# Patient Record
Sex: Female | Born: 1935
Health system: Southern US, Community
[De-identification: ages and names within clinical notes are randomized; demographics above are authoritative.]

## PROBLEM LIST (undated history)

## (undated) DIAGNOSIS — R609 Edema, unspecified: Secondary | ICD-10-CM

## (undated) DIAGNOSIS — I4891 Unspecified atrial fibrillation: Secondary | ICD-10-CM

## (undated) DIAGNOSIS — N83209 Unspecified ovarian cyst, unspecified side: Secondary | ICD-10-CM

## (undated) DIAGNOSIS — C801 Malignant (primary) neoplasm, unspecified: Secondary | ICD-10-CM

## (undated) DIAGNOSIS — I10 Essential (primary) hypertension: Secondary | ICD-10-CM

## (undated) DIAGNOSIS — F419 Anxiety disorder, unspecified: Secondary | ICD-10-CM

## (undated) DIAGNOSIS — E785 Hyperlipidemia, unspecified: Secondary | ICD-10-CM

## (undated) DIAGNOSIS — M549 Dorsalgia, unspecified: Secondary | ICD-10-CM

## (undated) DIAGNOSIS — M810 Age-related osteoporosis without current pathological fracture: Secondary | ICD-10-CM

## (undated) DIAGNOSIS — G8929 Other chronic pain: Secondary | ICD-10-CM

## (undated) DIAGNOSIS — R41841 Cognitive communication deficit: Secondary | ICD-10-CM

## (undated) DIAGNOSIS — M8580 Other specified disorders of bone density and structure, unspecified site: Secondary | ICD-10-CM

## (undated) DIAGNOSIS — C50919 Malignant neoplasm of unspecified site of unspecified female breast: Secondary | ICD-10-CM

## (undated) DIAGNOSIS — R109 Unspecified abdominal pain: Secondary | ICD-10-CM

## (undated) DIAGNOSIS — R1031 Right lower quadrant pain: Secondary | ICD-10-CM

## (undated) DIAGNOSIS — K219 Gastro-esophageal reflux disease without esophagitis: Secondary | ICD-10-CM

## (undated) HISTORY — PX: APPENDECTOMY: SHX54

## (undated) HISTORY — DX: Edema, unspecified: R60.9

## (undated) HISTORY — DX: Other specified disorders of bone density and structure, unspecified site: M85.80

## (undated) HISTORY — DX: Age-related osteoporosis without current pathological fracture: M81.0

## (undated) HISTORY — DX: Hyperlipidemia, unspecified: E78.5

## (undated) HISTORY — PX: BREAST SURGERY: SHX581

## (undated) HISTORY — PX: COLON RESECTION: SHX5231

## (undated) HISTORY — DX: Gastro-esophageal reflux disease without esophagitis: K21.9

## (undated) HISTORY — PX: COLON SURGERY: SHX602

## (undated) HISTORY — PX: HERNIA REPAIR: SHX51

---

## 2000-07-25 ENCOUNTER — Encounter: Admission: RE | Admit: 2000-07-25 | Discharge: 2000-07-25 | Payer: Self-pay | Admitting: General Surgery

## 2001-02-27 ENCOUNTER — Observation Stay (HOSPITAL_COMMUNITY): Admission: RE | Admit: 2001-02-27 | Discharge: 2001-02-28 | Payer: Self-pay | Admitting: General Surgery

## 2001-12-09 ENCOUNTER — Encounter: Payer: Self-pay | Admitting: General Surgery

## 2001-12-09 ENCOUNTER — Encounter: Payer: Self-pay | Admitting: Emergency Medicine

## 2001-12-10 ENCOUNTER — Inpatient Hospital Stay (HOSPITAL_COMMUNITY): Admission: EM | Admit: 2001-12-10 | Discharge: 2001-12-17 | Payer: Self-pay | Admitting: Emergency Medicine

## 2002-02-04 ENCOUNTER — Encounter: Payer: Self-pay | Admitting: Family Medicine

## 2002-02-04 ENCOUNTER — Ambulatory Visit (HOSPITAL_COMMUNITY): Admission: RE | Admit: 2002-02-04 | Discharge: 2002-02-04 | Payer: Self-pay | Admitting: Family Medicine

## 2002-09-27 ENCOUNTER — Emergency Department (HOSPITAL_COMMUNITY): Admission: EM | Admit: 2002-09-27 | Discharge: 2002-09-27 | Payer: Self-pay | Admitting: *Deleted

## 2002-09-27 ENCOUNTER — Encounter: Payer: Self-pay | Admitting: *Deleted

## 2002-10-04 ENCOUNTER — Encounter (HOSPITAL_COMMUNITY): Admission: RE | Admit: 2002-10-04 | Discharge: 2002-11-03 | Payer: Self-pay | Admitting: Family Medicine

## 2002-10-07 ENCOUNTER — Encounter: Payer: Self-pay | Admitting: Family Medicine

## 2003-02-23 ENCOUNTER — Emergency Department (HOSPITAL_COMMUNITY): Admission: EM | Admit: 2003-02-23 | Discharge: 2003-02-23 | Payer: Self-pay | Admitting: Emergency Medicine

## 2003-07-04 ENCOUNTER — Inpatient Hospital Stay (HOSPITAL_COMMUNITY): Admission: RE | Admit: 2003-07-04 | Discharge: 2003-07-09 | Payer: Self-pay | Admitting: General Surgery

## 2003-12-08 ENCOUNTER — Ambulatory Visit (HOSPITAL_COMMUNITY): Admission: RE | Admit: 2003-12-08 | Discharge: 2003-12-08 | Payer: Self-pay | Admitting: Family Medicine

## 2004-09-23 ENCOUNTER — Encounter: Admission: RE | Admit: 2004-09-23 | Discharge: 2004-09-23 | Payer: Self-pay | Admitting: Family Medicine

## 2005-10-20 ENCOUNTER — Ambulatory Visit (HOSPITAL_COMMUNITY): Admission: RE | Admit: 2005-10-20 | Discharge: 2005-10-20 | Payer: Self-pay | Admitting: Internal Medicine

## 2005-10-20 ENCOUNTER — Ambulatory Visit: Payer: Self-pay | Admitting: Internal Medicine

## 2005-10-26 ENCOUNTER — Encounter: Admission: RE | Admit: 2005-10-26 | Discharge: 2005-10-26 | Payer: Self-pay | Admitting: Family Medicine

## 2005-10-29 ENCOUNTER — Emergency Department (HOSPITAL_COMMUNITY): Admission: EM | Admit: 2005-10-29 | Discharge: 2005-10-29 | Payer: Self-pay | Admitting: Emergency Medicine

## 2006-04-30 ENCOUNTER — Inpatient Hospital Stay (HOSPITAL_COMMUNITY): Admission: EM | Admit: 2006-04-30 | Discharge: 2006-05-02 | Payer: Self-pay | Admitting: Emergency Medicine

## 2006-07-21 ENCOUNTER — Ambulatory Visit (HOSPITAL_COMMUNITY): Admission: RE | Admit: 2006-07-21 | Discharge: 2006-07-21 | Payer: Self-pay | Admitting: Family Medicine

## 2006-09-20 ENCOUNTER — Ambulatory Visit: Payer: Self-pay | Admitting: Cardiology

## 2006-10-18 ENCOUNTER — Ambulatory Visit: Payer: Self-pay | Admitting: Cardiology

## 2006-12-11 ENCOUNTER — Encounter: Admission: RE | Admit: 2006-12-11 | Discharge: 2006-12-11 | Payer: Self-pay | Admitting: Family Medicine

## 2007-01-08 ENCOUNTER — Ambulatory Visit: Payer: Self-pay | Admitting: Cardiology

## 2007-05-05 ENCOUNTER — Emergency Department (HOSPITAL_COMMUNITY): Admission: EM | Admit: 2007-05-05 | Discharge: 2007-05-05 | Payer: Self-pay | Admitting: Emergency Medicine

## 2007-08-14 ENCOUNTER — Ambulatory Visit (HOSPITAL_COMMUNITY): Admission: RE | Admit: 2007-08-14 | Discharge: 2007-08-14 | Payer: Self-pay | Admitting: Family Medicine

## 2008-08-13 ENCOUNTER — Ambulatory Visit (HOSPITAL_COMMUNITY): Admission: RE | Admit: 2008-08-13 | Discharge: 2008-08-13 | Payer: Self-pay | Admitting: Family Medicine

## 2008-08-19 ENCOUNTER — Encounter: Payer: Self-pay | Admitting: Family Medicine

## 2008-08-19 ENCOUNTER — Ambulatory Visit: Payer: Self-pay | Admitting: Cardiology

## 2008-08-19 ENCOUNTER — Ambulatory Visit (HOSPITAL_COMMUNITY): Admission: RE | Admit: 2008-08-19 | Discharge: 2008-08-19 | Payer: Self-pay | Admitting: Family Medicine

## 2008-09-06 ENCOUNTER — Observation Stay (HOSPITAL_COMMUNITY): Admission: EM | Admit: 2008-09-06 | Discharge: 2008-09-07 | Payer: Self-pay | Admitting: Emergency Medicine

## 2008-09-08 ENCOUNTER — Ambulatory Visit (HOSPITAL_COMMUNITY): Admission: RE | Admit: 2008-09-08 | Discharge: 2008-09-08 | Payer: Self-pay | Admitting: Family Medicine

## 2008-09-09 ENCOUNTER — Emergency Department (HOSPITAL_COMMUNITY): Admission: EM | Admit: 2008-09-09 | Discharge: 2008-09-09 | Payer: Self-pay | Admitting: Emergency Medicine

## 2008-09-12 ENCOUNTER — Ambulatory Visit: Payer: Self-pay | Admitting: Cardiology

## 2008-09-12 ENCOUNTER — Encounter: Payer: Self-pay | Admitting: Family Medicine

## 2008-09-12 ENCOUNTER — Inpatient Hospital Stay (HOSPITAL_COMMUNITY): Admission: EM | Admit: 2008-09-12 | Discharge: 2008-09-14 | Payer: Self-pay | Admitting: Emergency Medicine

## 2008-11-17 ENCOUNTER — Emergency Department (HOSPITAL_COMMUNITY): Admission: EM | Admit: 2008-11-17 | Discharge: 2008-11-17 | Payer: Self-pay | Admitting: Emergency Medicine

## 2009-03-21 ENCOUNTER — Emergency Department (HOSPITAL_COMMUNITY): Admission: EM | Admit: 2009-03-21 | Discharge: 2009-03-21 | Payer: Self-pay | Admitting: Emergency Medicine

## 2009-04-01 ENCOUNTER — Ambulatory Visit (HOSPITAL_COMMUNITY): Admission: RE | Admit: 2009-04-01 | Discharge: 2009-04-01 | Payer: Self-pay | Admitting: Family Medicine

## 2009-05-21 ENCOUNTER — Encounter: Admission: RE | Admit: 2009-05-21 | Discharge: 2009-05-21 | Payer: Self-pay | Admitting: Surgery

## 2009-05-25 ENCOUNTER — Ambulatory Visit (HOSPITAL_BASED_OUTPATIENT_CLINIC_OR_DEPARTMENT_OTHER): Admission: RE | Admit: 2009-05-25 | Discharge: 2009-05-25 | Payer: Self-pay | Admitting: Surgery

## 2009-05-25 ENCOUNTER — Encounter: Admission: RE | Admit: 2009-05-25 | Discharge: 2009-05-25 | Payer: Self-pay | Admitting: Surgery

## 2009-07-20 ENCOUNTER — Ambulatory Visit (HOSPITAL_COMMUNITY): Payer: Self-pay | Admitting: Oncology

## 2009-11-30 ENCOUNTER — Emergency Department (HOSPITAL_COMMUNITY): Admission: EM | Admit: 2009-11-30 | Discharge: 2009-11-30 | Payer: Self-pay | Admitting: Emergency Medicine

## 2009-12-18 ENCOUNTER — Encounter (HOSPITAL_COMMUNITY): Admission: RE | Admit: 2009-12-18 | Discharge: 2010-01-17 | Payer: Self-pay | Admitting: Cardiovascular Disease

## 2009-12-22 ENCOUNTER — Encounter (INDEPENDENT_AMBULATORY_CARE_PROVIDER_SITE_OTHER): Payer: Self-pay | Admitting: Cardiovascular Disease

## 2010-07-05 ENCOUNTER — Inpatient Hospital Stay (INDEPENDENT_AMBULATORY_CARE_PROVIDER_SITE_OTHER)
Admission: RE | Admit: 2010-07-05 | Discharge: 2010-07-05 | Disposition: A | Discharge status: Home / Self Care | Payer: Medicare Other | Source: Ambulatory Visit | Attending: Family Medicine | Admitting: Family Medicine

## 2010-07-05 DIAGNOSIS — H6690 Otitis media, unspecified, unspecified ear: Secondary | ICD-10-CM

## 2010-07-19 ENCOUNTER — Ambulatory Visit (HOSPITAL_COMMUNITY): Payer: Self-pay | Admitting: Oncology

## 2010-08-02 ENCOUNTER — Inpatient Hospital Stay (HOSPITAL_COMMUNITY)
Admission: EM | Admit: 2010-08-02 | Discharge: 2010-08-05 | DRG: 310 | Disposition: A | Discharge status: Home Health | Payer: Medicare Other | Attending: Internal Medicine | Admitting: Internal Medicine

## 2010-08-02 ENCOUNTER — Emergency Department (HOSPITAL_COMMUNITY): Payer: Medicare Other

## 2010-08-02 ENCOUNTER — Encounter (HOSPITAL_COMMUNITY): Payer: Self-pay | Admitting: Radiology

## 2010-08-02 DIAGNOSIS — F3289 Other specified depressive episodes: Secondary | ICD-10-CM | POA: Diagnosis present

## 2010-08-02 DIAGNOSIS — I4891 Unspecified atrial fibrillation: Principal | ICD-10-CM | POA: Diagnosis present

## 2010-08-02 DIAGNOSIS — Z853 Personal history of malignant neoplasm of breast: Secondary | ICD-10-CM

## 2010-08-02 DIAGNOSIS — E876 Hypokalemia: Secondary | ICD-10-CM | POA: Diagnosis present

## 2010-08-02 DIAGNOSIS — F329 Major depressive disorder, single episode, unspecified: Secondary | ICD-10-CM | POA: Diagnosis present

## 2010-08-02 DIAGNOSIS — F411 Generalized anxiety disorder: Secondary | ICD-10-CM | POA: Diagnosis present

## 2010-08-02 HISTORY — DX: Essential (primary) hypertension: I10

## 2010-08-02 HISTORY — DX: Malignant (primary) neoplasm, unspecified: C80.1

## 2010-08-02 LAB — BASIC METABOLIC PANEL
BUN: 17 mg/dL (ref 6–23)
Calcium: 9.2 mg/dL (ref 8.4–10.5)
Creatinine, Ser: 0.9 mg/dL (ref 0.4–1.2)
GFR calc non Af Amer: 60 mL/min — ABNORMAL LOW (ref >60)
Glucose, Bld: 122 mg/dL — ABNORMAL HIGH (ref 70–99)

## 2010-08-02 LAB — POCT CARDIAC MARKERS
CKMB, poc: 1.9 ng/mL (ref 1.0–8.0)
Myoglobin, poc: 171 ng/mL (ref 12–200)

## 2010-08-02 LAB — CBC
Hemoglobin: 13.7 g/dL (ref 12.0–15.0)
MCH: 27.9 pg (ref 26.0–34.0)
MCHC: 32.3 g/dL (ref 30.0–36.0)
Platelets: 276 10*3/uL (ref 150–400)
RDW: 13 % (ref 11.5–15.5)

## 2010-08-02 LAB — PROTIME-INR
INR: 1.57 — ABNORMAL HIGH (ref 0.00–1.49)
Prothrombin Time: 19 seconds — ABNORMAL HIGH (ref 11.6–15.2)

## 2010-08-02 LAB — DIFFERENTIAL
Basophils Absolute: 0 10*3/uL (ref 0.0–0.1)
Basophils Relative: 0 % (ref 0–1)
Eosinophils Absolute: 0.7 10*3/uL (ref 0.0–0.7)
Monocytes Absolute: 0.9 10*3/uL (ref 0.1–1.0)
Neutro Abs: 6 10*3/uL (ref 1.7–7.7)

## 2010-08-02 LAB — MRSA PCR SCREENING: MRSA by PCR: NEGATIVE

## 2010-08-03 LAB — CARDIAC PANEL(CRET KIN+CKTOT+MB+TROPI)
Relative Index: INVALID (ref 0.0–2.5)
Troponin I: 0.01 ng/mL (ref 0.00–0.06)

## 2010-08-03 LAB — COMPREHENSIVE METABOLIC PANEL
ALT: 16 U/L (ref 0–35)
Alkaline Phosphatase: 58 U/L (ref 39–117)
BUN: 16 mg/dL (ref 6–23)
CO2: 27 mEq/L (ref 19–32)
Calcium: 9.1 mg/dL (ref 8.4–10.5)
GFR calc non Af Amer: >60 mL/min (ref >60)
Glucose, Bld: 115 mg/dL — ABNORMAL HIGH (ref 70–99)
Potassium: 4.6 mEq/L (ref 3.5–5.1)
Sodium: 142 mEq/L (ref 135–145)
Total Protein: 5.7 g/dL — ABNORMAL LOW (ref 6.0–8.3)

## 2010-08-03 LAB — CBC
HCT: 37.5 % (ref 36.0–46.0)
Hemoglobin: 12.4 g/dL (ref 12.0–15.0)
MCHC: 33.1 g/dL (ref 30.0–36.0)
MCV: 86.6 fL (ref 78.0–100.0)
RDW: 13 % (ref 11.5–15.5)

## 2010-08-03 LAB — DIFFERENTIAL
Basophils Absolute: 0 10*3/uL (ref 0.0–0.1)
Eosinophils Relative: 6 % — ABNORMAL HIGH (ref 0–5)
Lymphocytes Relative: 33 % (ref 12–46)
Lymphs Abs: 2.8 10*3/uL (ref 0.7–4.0)
Monocytes Absolute: 0.7 10*3/uL (ref 0.1–1.0)
Neutro Abs: 4.4 10*3/uL (ref 1.7–7.7)

## 2010-08-03 LAB — PROTIME-INR: Prothrombin Time: 20.1 seconds — ABNORMAL HIGH (ref 11.6–15.2)

## 2010-08-03 NOTE — H&P (Signed)
[name], [name]               ACCOUNT NO.:  [id number]  MEDICAL RECORD NO.:  [id number]           PATIENT TYPE:  I  LOCATION:  IC07                          FACILITY:  [facility]  PHYSICIAN:  Wilson Singer, M.D.DATE OF BIRTH:  1924/04/22  DATE OF ADMISSION:  08/02/2010 DATE OF DISCHARGE:  LH                             HISTORY & PHYSICAL   CHIEF COMPLAINT:  Palpitations.  HISTORY OF PRESENT ILLNESS:  This very pleasant 77 year old lady had sudden onset of palpitations this morning approximately 6-7 hours ago. She denied any dyspnea or chest pain with these palpitations, nor was there any loss of consciousness, lightheadedness, or dizziness.  This patient is known to have paroxysmal atrial fibrillation and is normally on Coumadin for this.  She last had a stress test in July 2011 by Dr. Garen Lah.  This was negative for ischemia.  She has also had an echocardiogram done in August 2011, which showed slightly reduced ejection fraction of 45-50% with no other major abnormalities.  She otherwise functions well without having dyspnea, palpitations, or chest pain on exertion.  PAST MEDICAL HISTORY:  Recent diagnosis of left breast cancer in January 2011 when she had a left lumpectomy and all margins were clear. Laparotomy and colostomy as well as surgery for gangrenous appendix more than 10 years ago.  Paroxysmal atrial fibrillation, depression and anxiety.  MEDICATIONS: 1. Diltiazem 120 mg daily. 2. Fluoxetine 10 mg daily. 3. Levaquin 500 mg daily started on July 29, 2010 for recent symptoms     of cough, likely to be bronchitis. 4. Metoprolol 25 mg daily, . 5. Xanax 0.5 mg half to one tablet daily. 6. Warfarin 5 mg/2.5 mg alternate days.  ALLERGIES:  MORPHINE which produces itching, PENICILLIN and SULFA which I believe both produce a rash.  SOCIAL HISTORY:  She has been widowed for 19 years and lives alone.  She does not smoke and does not drink alcohol.  FAMILY HISTORY:   Noncontributory.  REVIEW OF SYSTEMS:  Apart from the symptoms mentioned above, there are no other symptoms referable to all systems reviewed.  PHYSICAL EXAMINATION:  VITAL SIGNS:  Temperature 98.3, blood pressure 110/58, pulse 90 and is currently in atrial fibrillation.  When she presented, her ventricular rate was 114 in atrial fibrillation, and she has since been started on Cardizem drip, respiratory rate 12-14, saturation 95% on room air. GENERAL:  There is no clubbing.  There is no peripheral central cyanosis.  There is no jaundice. CARDIOVASCULAR:  Heart sounds are present and irregular consistent with atrial fibrillation.  Jugular venous pressure is not raised. RESPIRATORY:  Lung fields are clear except for bilateral basal crackles. ABDOMEN:  Soft and nontender with no hepatosplenomegaly. NEUROLOGIC:  She is alert and oriented without any focal neurologic signs. SKIN:  There are no skin lesions or rashes. MUSCULOSKELETAL:  She has some osteoarthritic changes in her hands.  INVESTIGATIONS:  Chest x-ray shows no acute disease with no evidence of heart failure.  Lab work shows sodium 140, potassium 3.3, bicarbonate 26, glucose 122, BUN 17, creatinine 0.9, INR was subtherapeutic at 1.57. Hemoglobin 13.7, white blood cell count 10.7,  platelets 276.  Initial cardiac markers are negative.  PROBLEM LIST: 1. Atrial fibrillation with rapid ventricular response. 2. Depression and anxiety. 3. History of breast cancer, status post left lumpectomy in January     2011.  PLAN: 1. Admit to telemetry. 2. Increase dose of metoprolol to 25 mg b.i.d. and increase Cardizem     to 180 mg daily. 3. Achieve therapeutic anticoagulation. 4. Cardiology consultation with [facility]. 5. Replete potassium. 6. Further recommendations will depend on the patient's hospital     progress.     Wilson Singer, M.D.     NCG/MEDQ  D:  08/02/2010  T:  08/03/2010  Job:   [id number]  cc:   [name] A. Gerda Diss, MD Fax: [phone]  Nanetta Batty, M.D. Fax: [phone]  Electronically Signed by [name] M.D. on 08/03/2010 04:57:58 PM

## 2010-08-04 LAB — PROTIME-INR
INR: 1.77 — ABNORMAL HIGH (ref 0.00–1.49)
Prothrombin Time: 20.8 seconds — ABNORMAL HIGH (ref 11.6–15.2)

## 2010-08-04 NOTE — Consult Note (Signed)
  [name], Katherine NO.:  [id number]  MEDICAL RECORD NO.:  [id number]           PATIENT TYPE:  LOCATION:                                 FACILITY:  PHYSICIAN:  Nanetta Batty, M.D.   DATE OF BIRTH:  09/23/1923  DATE OF CONSULTATION: DATE OF DISCHARGE:                                CONSULTATION   HISTORY OF PRESENT ILLNESS:  [name] is an 77 year old mildly overweight widowed Caucasian female mother of 1, grandmother 2 grandchildren, and great grandmother of 2 great-grandchildren who is retired from [employer].  I saw her in the office approximately 1 month ago.  She was admitted to [facility] prior to that with AFib with RVR and converted spontaneously to sinus rhythm overnight. She does complain of some dyspnea on exertion.  I elected to put her on Coumadin.  Echo performed at [facility] showed an EF of 45-50% with mild inferior hypokinesia and a Myoview stress test performed there which was low risk, nonischemic.  She was admitted yesterday with AFib with RPR.  Her INR was subtherapeutic.  She was put on IV diltiazem transiently and converted to sinus rhythm.  She is currently asymptomatic.  OUTPATIENT MEDICATIONS:  Diltiazem 120, Coumadin as directed, Prilosec, metoprolol succinate 25 mg a day.  ALLERGIES:  MORPHINE.  REVIEW OF SYSTEMS:  Twelve-point review of systems is negative except as already noted.  PHYSICAL EXAMINATION:  VITAL SIGNS:  Blood pressures of 128/60, pulse 60. GENERAL:  She is alert and oriented. NEUROLOGIC:  Intact. LUNGS:  Clear.  __________ HEART:  Regular rate and rhythm without murmurs, gallops, rubs, or clicks.  DIAGNOSTIC STUDIES:  Twelve lead electrocardiogram on admission revealed AFib with RVR and ventricular response of 150, nonspecific ST-T wave changes.  A subsequent EKG performed today revealed sinus bradycardia 52.  Chest x-ray showed no active disease.  LABORATORY DATA:  Lab  work revealed hemoglobin of 12.4, hematocrit of 37.5, white count of 8.4, platelet count of 226.  Cardiac enzymes were negative.  Sodium was 142, potassium 4.6, chloride 107, CO2 of 27, glucose 115, BUN 16, creatinine 0.7.  IMPRESSION:  [name] has converted to sinus rhythm.  Her warfarin was adjusted.  She was on Lovenox subcu.  At this point, she can be discharged home after Lovenox or stay hospital until she is therapeutic on Coumadin.  We will see her back as needed.     Nanetta Batty, M.D.     JB/MEDQ  D:  08/03/2010  T:  08/04/2010  Job:  [id number]  cc:   [facility] [address] Earl Many A. Gerda Diss, MD Fax: [phone]  Electronically Signed by Nanetta Batty M.D. on 08/04/2010 02:00:46 PM

## 2010-08-05 LAB — DIFFERENTIAL
Basophils Absolute: 0 10*3/uL (ref 0.0–0.1)
Basophils Relative: 0 % (ref 0–1)
Eosinophils Absolute: 0.5 10*3/uL (ref 0.0–0.7)
Neutro Abs: 4.1 10*3/uL (ref 1.7–7.7)
Neutrophils Relative %: 47 % (ref 43–77)

## 2010-08-05 LAB — CBC
Hemoglobin: 13.1 g/dL (ref 12.0–15.0)
Platelets: 247 10*3/uL (ref 150–400)
RBC: 4.64 MIL/uL (ref 3.87–5.11)
WBC: 8.9 10*3/uL (ref 4.0–10.5)

## 2010-08-05 LAB — PROTIME-INR: INR: 1.77 — ABNORMAL HIGH (ref 0.00–1.49)

## 2010-08-08 LAB — POCT CARDIAC MARKERS
Myoglobin, poc: 220 ng/mL (ref 12–200)
Troponin i, poc: <0.05 ng/mL (ref 0.00–0.09)

## 2010-08-08 LAB — BASIC METABOLIC PANEL
CO2: 25 mEq/L (ref 19–32)
Chloride: 109 mEq/L (ref 96–112)
Creatinine, Ser: 0.72 mg/dL (ref 0.4–1.2)
GFR calc Af Amer: >60 mL/min (ref >60)
Potassium: 3.4 mEq/L — ABNORMAL LOW (ref 3.5–5.1)
Sodium: 142 mEq/L (ref 135–145)

## 2010-08-08 LAB — DIFFERENTIAL
Eosinophils Relative: 2 % (ref 0–5)
Lymphocytes Relative: 36 % (ref 12–46)
Lymphs Abs: 3.4 10*3/uL (ref 0.7–4.0)
Monocytes Relative: 11 % (ref 3–12)

## 2010-08-08 LAB — CBC
Hemoglobin: 13.7 g/dL (ref 12.0–15.0)
MCV: 85.4 fL (ref 78.0–100.0)
Platelets: 226 10*3/uL (ref 150–400)
RBC: 4.7 MIL/uL (ref 3.87–5.11)
WBC: 9.5 10*3/uL (ref 4.0–10.5)

## 2010-08-23 LAB — CBC
HCT: 37.8 % (ref 36.0–46.0)
Platelets: 264 10*3/uL (ref 150–400)
RBC: 4.33 MIL/uL (ref 3.87–5.11)
WBC: 8.6 10*3/uL (ref 4.0–10.5)

## 2010-08-23 LAB — COMPREHENSIVE METABOLIC PANEL
ALT: 20 U/L (ref 0–35)
AST: 23 U/L (ref 0–37)
Alkaline Phosphatase: 67 U/L (ref 39–117)
CO2: 29 mEq/L (ref 19–32)
Calcium: 9.1 mg/dL (ref 8.4–10.5)
Chloride: 106 mEq/L (ref 96–112)
GFR calc Af Amer: >60 mL/min (ref >60)
GFR calc non Af Amer: >60 mL/min (ref >60)
Glucose, Bld: 105 mg/dL — ABNORMAL HIGH (ref 70–99)
Sodium: 142 mEq/L (ref 135–145)
Total Bilirubin: 0.7 mg/dL (ref 0.3–1.2)

## 2010-08-23 LAB — URINALYSIS, ROUTINE W REFLEX MICROSCOPIC
Glucose, UA: NEGATIVE mg/dL
Ketones, ur: NEGATIVE mg/dL
Nitrite: NEGATIVE
Protein, ur: NEGATIVE mg/dL
Urobilinogen, UA: 0.2 mg/dL (ref 0.0–1.0)

## 2010-08-23 LAB — DIFFERENTIAL
Basophils Absolute: 0 10*3/uL (ref 0.0–0.1)
Basophils Relative: 0 % (ref 0–1)
Eosinophils Absolute: 0.2 10*3/uL (ref 0.0–0.7)
Eosinophils Relative: 2 % (ref 0–5)
Lymphs Abs: 2.8 10*3/uL (ref 0.7–4.0)
Neutrophils Relative %: 57 % (ref 43–77)

## 2010-08-23 LAB — URINE MICROSCOPIC-ADD ON

## 2010-08-26 LAB — URINE CULTURE: Colony Count: >=100000

## 2010-08-26 LAB — URINALYSIS, ROUTINE W REFLEX MICROSCOPIC
Bilirubin Urine: NEGATIVE
Glucose, UA: NEGATIVE mg/dL
Ketones, ur: NEGATIVE mg/dL
Nitrite: POSITIVE — AB
Protein, ur: 100 mg/dL — AB
Specific Gravity, Urine: 1.015 (ref 1.005–1.030)
Urobilinogen, UA: 0.2 mg/dL (ref 0.0–1.0)
pH: 6 (ref 5.0–8.0)

## 2010-08-26 LAB — URINE MICROSCOPIC-ADD ON

## 2010-08-27 NOTE — Discharge Summary (Signed)
  NAMEHEBAH, [name]               ACCOUNT NO.:  [id number]  MEDICAL RECORD NO.:  [id number]           PATIENT TYPE:  I  LOCATION:  A317                          FACILITY:  [facility]  PHYSICIAN:  Marrisa Kimber I Sheronica Corey, MD      DATE OF BIRTH:  01-26-1924  DATE OF ADMISSION:  08/03/2010 DATE OF DISCHARGE:  03/15/2012LH                              DISCHARGE SUMMARY   PRIMARY CARE PHYSICIAN:  Scott A. Gerda Diss, MD  CARDIOLOGIST:  Nanetta Batty, MD  DISCHARGE DIAGNOSES: 1. Atrial fibrillation with rapid ventricular response, but now normal     sinus rhythm. 2. History of paroxysmal atrial fibrillation. 3. History of left breast cancer status post left lumpectomy. 4. History of depression and anxiety.  DISCHARGE MEDICATIONS: 1. Lovenox 100 mg subcu daily for 3 days. 2. Coumadin 7.5 mg p.o. daily for 3 days.  Coumadin level will be     checked on Monday. 3. Metoprolol 12.5 mg p.o. daily which is Toprol-XL. 4. Ciprodex otic 4 drops in the left ear twice daily. 5. Diltiazem 120 mg p.o. daily. 6. Fluoxetine 10 mg p.o. daily. 7. Xanax 0.25 mg p.o. daily at bedtime.  FOLLOWUP:  The patient need to followup her Coumadin level on Monday, August 09, 2010.  The patient given a prescription for 3 days of Lovenox and Coumadin 7.5 mg.  A nurse will be coming to administer the Lovenox and checking her PT/INR on Monday, August 09, 2010.  CONSULTATION:  Cardiology consulted, done by Dr. Allyson Sabal.  PROCEDURE:  Chest x-ray, no active disease.  HISTORY OF PRESENT ILLNESS:  This is an 77 year old widowed Caucasian female.  She follows up with Dr. Allyson Sabal, regarding AFib and history of paroxysmal AFib.  She had recently a Myoview stress test done on July of 2011, which did show adequate vasodilator response to [place] and no evidence of ischemia.  The patient presented with AFib with RVR which is spontaneously converted to sinus rhythm.  The patient admitted to step down.  Cardiac enzymes were negative.   Cardiology recommend achieving subtherapeutic INR either inpatient or outpatient and followup with them as outpatient.  Today, her INR was 1.77.  She received all those days 5 mg p.o. daily and increased today to 7.5 mg.  The patient will be discharged with Lovenox 100 mg subcu daily for 3 days and INR need to be checked on Monday.  We will send a nurse to take patient's PT/INR on Monday, also patient will be sent with physical therapy.  Currently today, patient's INR is 1.77, and hemoglobin of 13.1, hematocrit 38.9.  The patient was advised to follow up with her primary care.  Also, TSH test was 2.460.     Aaradhya Kysar Bosie Helper, MD     HIE/MEDQ  D:  08/05/2010  T:  08/06/2010  Job:  [id number]  cc:   Nanetta Batty, M.D. Fax: [phone]  Scott A. Gerda Diss, MD Fax: [phone]  [address] [facility]  Electronically Signed by Ebony Cargo MD on 08/27/2010 05:19:55 AM

## 2010-08-30 LAB — POCT CARDIAC MARKERS
CKMB, poc: 2.8 ng/mL (ref 1.0–8.0)
Myoglobin, poc: 172 ng/mL (ref 12–200)
Troponin i, poc: <0.05 ng/mL (ref 0.00–0.09)

## 2010-08-30 LAB — PROTIME-INR
INR: 1 (ref 0.00–1.49)
Prothrombin Time: 13.5 seconds (ref 11.6–15.2)

## 2010-09-01 LAB — BASIC METABOLIC PANEL
CO2: 26 mEq/L (ref 19–32)
CO2: 28 mEq/L (ref 19–32)
Calcium: 8.9 mg/dL (ref 8.4–10.5)
Calcium: 9.2 mg/dL (ref 8.4–10.5)
Creatinine, Ser: 0.91 mg/dL (ref 0.4–1.2)
GFR calc Af Amer: >60 mL/min (ref >60)
GFR calc non Af Amer: 59 mL/min — ABNORMAL LOW (ref >60)
Glucose, Bld: 119 mg/dL — ABNORMAL HIGH (ref 70–99)
Glucose, Bld: 159 mg/dL — ABNORMAL HIGH (ref 70–99)
Sodium: 141 mEq/L (ref 135–145)

## 2010-09-01 LAB — POCT CARDIAC MARKERS
CKMB, poc: <1 ng/mL — ABNORMAL LOW (ref 1.0–8.0)
Myoglobin, poc: 93.5 ng/mL (ref 12–200)
Troponin i, poc: <0.05 ng/mL (ref 0.00–0.09)

## 2010-09-01 LAB — CSF CELL COUNT WITH DIFFERENTIAL
RBC Count, CSF: 1253 /mm3 — ABNORMAL HIGH
Tube #: 1
Tube #: 4
WBC, CSF: 0 /mm3 (ref 0–5)

## 2010-09-01 LAB — CBC
HCT: 39 % (ref 36.0–46.0)
Hemoglobin: 13.6 g/dL (ref 12.0–15.0)
MCHC: 34.2 g/dL (ref 30.0–36.0)
MCHC: 35 g/dL (ref 30.0–36.0)
Platelets: 223 10*3/uL (ref 150–400)
RDW: 12.7 % (ref 11.5–15.5)
RDW: 12.7 % (ref 11.5–15.5)

## 2010-09-01 LAB — CARDIAC PANEL(CRET KIN+CKTOT+MB+TROPI)
Relative Index: INVALID (ref 0.0–2.5)
Relative Index: INVALID (ref 0.0–2.5)
Troponin I: 0.03 ng/mL (ref 0.00–0.06)
Troponin I: 0.04 ng/mL (ref 0.00–0.06)

## 2010-09-01 LAB — DIFFERENTIAL
Basophils Absolute: 0 10*3/uL (ref 0.0–0.1)
Basophils Absolute: 0 10*3/uL (ref 0.0–0.1)
Basophils Relative: 0 % (ref 0–1)
Basophils Relative: 1 % (ref 0–1)
Eosinophils Relative: 3 % (ref 0–5)
Monocytes Absolute: 0.6 10*3/uL (ref 0.1–1.0)
Neutro Abs: 2.9 10*3/uL (ref 1.7–7.7)
Neutro Abs: 4 10*3/uL (ref 1.7–7.7)
Neutrophils Relative %: 42 % — ABNORMAL LOW (ref 43–77)

## 2010-09-01 LAB — CSF CULTURE W GRAM STAIN
Culture: NO GROWTH
Gram Stain: NONE SEEN

## 2010-10-05 NOTE — H&P (Signed)
[name], [name]               ACCOUNT NO.:  [id number]   MEDICAL RECORD NO.:  [id number]          PATIENT TYPE:  OBV   LOCATION:  A304                          FACILITY:  [facility]   PHYSICIAN:  Scott A. Gerda Diss, MD    DATE OF BIRTH:  November 26, 1923   DATE OF ADMISSION:  09/06/2008  DATE OF DISCHARGE:  04/18/2010LH                              HISTORY & PHYSICAL   CHIEF COMPLAINT:  Severe headache.   HISTORY OF PRESENT ILLNESS:  This 77 year old white female states that  she got up around midnight of September 05, 2008, into the early morning  hours of September 06, 2008.  She states there was no trauma to her head.  She just started having a headache.  She states that the headache came  out of the blue and is pretty severe and is getting worse and she states  it is in the left forehead reaching and throbbing, nausea with it, some  vomiting with it.  She denied any neck stiffness, but state the pain did  radiate toward the back of her head.  Denied any passing out or fevers,  recent illness.   PAST MEDICAL HISTORY:  She has had problems with diverticulitis,  previous abdominal surgery.  She is not someone to have headaches.  She  denies any recent coughing, chest tightness, shortness of breath,  swelling in the legs, abdominal pain, dysuria, change in mental status.  She states she has side effects to morphine and sulfa and takes a Xanax  at night time to help rest.   FAMILY HISTORY:  Heart disease and hypertension.   SURGERY HISTORY:  Bowel resection, hernia repair, appendectomy.   She does not smoke or drink.   REVIEW OF SYSTEMS:  See per above.   PHYSICAL EXAMINATION:  GENERAL:  NAD.  VITAL SIGNS:  BP mildly elevated when she first came in.  HEENT:  TMs NL.  Pupils responsive to light.  EOMI.  NECK:  Supple.  LUNGS:  Clear.  HEART:  Regular.  No murmurs.  ABDOMEN:  Soft.  EXTREMITIES:  No edema.  NEUROLOGIC:  Grossly normal.  She does not have any focal deficit or  weakness.   CT scan negative.   ASSESSMENT/PLAN:  Severe headache.  I talked with the patient regarding  what is going on.  I told her that she really needs to have LP, and I  discussed this with her and she consented to have the lumbar puncture  done.  The  lumbar puncture was obtained by the emergency room doctor.  It was a  bloody tap but the fourth tube was less bled than the first tube and no  xanthochromia.  Severe headache - admit the patient and observe under  observation.  Sed rate, CBC, and MET-7 were done, and the patient was in  stable condition upon observation admission.      Scott A. Gerda Diss, MD  Electronically Signed     SAL/MEDQ  D:  09/09/2008  T:  09/09/2008  Job:  [id number]

## 2010-10-05 NOTE — Letter (Signed)
January 08, 2007    Scott A. Gerda Diss, MD  [address], [address]  [place], [place] [address]   RE:  [name], [name]  MRN:  [id number]  /  DOB:  [birth year]   Dear [name]:   [name] returns to the office for continued assessment and treatment  of gait instability.  Since her last visit, those symptoms have  resolved.  She has been monitoring blood pressure at home, which has  been fine.  Her pharmacist told her that chlorthalidone interacts with  Celexa, so she discontinued Celexa.  She has had no chest discomfort.  There has been no dyspnea.  There has been no pedal edema.   CURRENT MEDICATIONS:  Include only chlorthalidone 12.5 mg daily.   EXAM:  A pleasant woman in no acute distress.  The blood pressure is  150/80, heart rate 77 and regular, respirations 18, weight 155, two  pounds less than in May.  NECK:  No jugular venous distention; normal carotid upstrokes without  bruits.  LUNGS:  Clear.  CARDIAC:  Normal first and second heart sounds; no murmur nor gallop  appreciated.  ABDOMEN:  Soft and nontender; no bruits.  EXTREMITIES:  Normal distal pulses; no edema.   LABORATORY:  Includes normal electrolytes, normal renal function and  normal LFTs while on chlorthalidone.  Lipid profile is suboptimal with  total cholesterol of 234, triglycerides of 363, HDL of 52 and LDL of  109.   IMPRESSION:  [name] is doing generally well.  She appears to have a  component of white coat hypertension, but blood pressure control at home  is fine.  She will continue her current minimal medical regimen.   [name] was reassessed by Dr. Vickey Huger, who found no neurologic  abnormalities and no symptoms, in June.  The patient refused  transcranial Doppler.  No further assessment was recommended.   Holter monitoring was planned but never obtained.  [name] is not  inclined to undergo that test at the present time since she is feeling  well.  I believe that that is a reasonable approach.   In light of  multiple adverse reactions to medication, I also believe it is  reasonable not to treat her mild hyperlipidemia.  I will be happy to  reassess this nice woman at any time you deem appropriate.    Sincerely,      Gerrit Friends. Dietrich Pates, MD, [facility]  Electronically Signed    RMR/MedQ  DD: 01/08/2007  DT: 01/08/2007  Job #: [id number]

## 2010-10-05 NOTE — Letter (Signed)
Oct 18, 2006    Scott A. Gerda Diss, MD  [address]  [place], [place] [address]   RE:  [name], Katherine [name]  MRN:  [id number]  /  DOB:  1923/08/26   Dear Katherine [name]:   Katherine [name] returns to the office for continued assessment and treatment  of malaise, weakness and hypertension.  Since her last visit, she has  been stable.  She reports exercise intolerance, but no severe symptoms.  Blood pressure control has been improved, but she does not bring back a  list of values.  She was scheduled to see her neurologist, missed that  appointment.  She has not yet been permitted to obtain a Holter monitor,  due to the absence of a single technician at the hospital.   CURRENT MEDICATIONS:  Include only:  1. Celexa 20 mg q.d.  2. Chlorthalidone 12.5 mg q.d.   EXAMINATION:  GENERAL:  Vague, pleasant [name] in no acute distress.  VITAL SIGNS:  The weight is 157, 2 pounds less than in April.  Blood  pressure 130/70, heart rate 72 and regular, respirations 16.  NECK:  No jugular venous distention.  LUNGS:  Clear.  CARDIAC:  Normal first and second heart sounds; modest systolic ejection  murmur at the base.  EXTREMITIES:  No edema.   Repeat chemistry profile is normal.   IMPRESSION:  Katherine [name] is doing generally well.  Blood pressure  control is good.  We will re-schedule her neurology appointment.  We  will attempt to obtain the Holter monitor as planned.  We will continue  to monitor electrolytes and blood pressure.  I would raise the question  as to  whether Katherine [name] has a mild form of dementia.  I will plan to see  Katherine [name] again in 3 months.  Due to her history of  hyperlipidemia, a lipid profile will be repeated.  Vaccinations are up  to date.    Sincerely,      Gerrit Friends. Dietrich Pates, MD, [facility]  Electronically Signed    RMR/MedQ  DD: 10/18/2006  DT: 10/18/2006  Job #: [id number]   CC:    Melvyn Novas, M.D.

## 2010-10-05 NOTE — H&P (Signed)
[name], [name]               ACCOUNT NO.:  [id number]   MEDICAL RECORD NO.:  [id number]          PATIENT TYPE:  INP   LOCATION:  IC06                          FACILITY:  [facility]   PHYSICIAN:  Scott A. Gerda Diss, MD    DATE OF BIRTH:  08-Feb-1924   DATE OF ADMISSION:  09/12/2008  DATE OF DISCHARGE:  LH                              HISTORY & PHYSICAL   CHIEF COMPLAINT:  Rapid heartbeat   HISTORY OF PRESENT ILLNESS:  This 77 year old female states that she  started feeling palpitations this morning and then her heart rate  started increasing a whole lot faster. She denied any chest tightness,  pressure pain, shortness of breath, sweating or lightheadedness or  dizziness, and she felt that it was probably wise for her to come to the  emergency department to get checked. Therefore, she came on. She  recently had bad headaches about a week ago and was in the hospital for  that. But MRI and MRA were negative.  She did have an LP at that time  and has been doing much better in regards to the headache.   PAST MEDICAL HISTORY:  Back in 1999 after surgery she had atrial fib  with rapid ventricular response. Has not had anything since that time.  Also has a history of bronchitis diverticulitis. Also surgical history  of bowel resection due to diverticulitis, appendectomy, hernia repair.   SOCIAL HISTORY:  She does not smoke or drink.  Denies any drug abuse.  Had been using some decongestants recently.   ALLERGIES:  MORPHINE and SULFA cause side effects.   MEDICATIONS:  Xanax 0.25 mg q.h.s. p.r.n.   REVIEW OF SYSTEMS:  Negative currently for headache, excessive thirst  shortness of breath.   PHYSICAL EXAMINATION:  HEENT: Benign.  NECK: No masses.  CHEST: No crackles.  HEART:  Regular but somewhat tachycardiac around 110 currently. At one  point it was all the way up to 160-170 when treated by ER doctor.  ABDOMEN:  Soft.  EXTREMITIES:  No edema.  NEUROLOGIC:  Grossly normal.   Lab work  including cardiac enzymes, met-7, lipid liver profile and CBC  all looked good.  Chest x-ray: No acute changes.  BNP was normal. And  EKG showed what appeared to be a sinus tach with a heart rate in the  150's. But then after given some Cardizem and slowed down she had what  appeared to be some atrial fib with rapid ventricular response and a/p  atrial fib with rapid ventricular response - new onset, has been a  recurrence but last bout was 1999.  We will do an echo. Will do cardiac  enzymes. Consult cardiology. Also admit to the unit. Titrate Cardizem.  Keep heart rate less than 120.  May need a beta blocker if this becomes  more of an SVT type problem. Expect the patient to gradually get under a  normal sinus rhythm. Cardiology was consulted but was unable to see the  patient because cardiologist was leaving early for the day to go to  [facility] for responsibilities there.  The patient currently  is stable  but certainly if becomes unstable may need to transfer to [facility].  The  patient is currently without any symptoms and tolerating procedure fine.      Scott A. Gerda Diss, MD  Electronically Signed     SAL/MEDQ  D:  09/12/2008  T:  09/12/2008  Job:  [id number]

## 2010-10-05 NOTE — Discharge Summary (Signed)
[name], [name]               ACCOUNT NO.:  [id number]   MEDICAL RECORD NO.:  [id number]          PATIENT TYPE:  OBV   LOCATION:  A304                          FACILITY:  [facility]   PHYSICIAN:  Scott A. Gerda Diss, MD    DATE OF BIRTH:  1924-04-10   DATE OF ADMISSION:  DATE OF DISCHARGE:  04/18/2010LH                               DISCHARGE SUMMARY   DISCHARGE SUMMARY OBSERVATION:  1. Headache.  2. Vomiting secondary to #1.   HOSPITAL COURSE:  The patient was admitted with severe headaches.  She  states it actually became a little bit better throughout the course of  the day on Saturday.  She threw up once or twice more.  She did not feel  like eating, partly because she stated the drugs given to her in the ER  made her so miserable and woozy along with nauseous.  By Saturday  evening, the nausea went away and she felt better.  She slept well  Saturday night and on Sunday she ate breakfast and lunch and was able to  get up and walk around, no focal neurologic deficits.  The patient still  had a mild headache.  It was felt best to set her up for an MRI and MRA  as an outpatient and it was also felt that she could easily go home in  stable condition and she was sent home instructed to get the MRI, MRA  and also to follow-up in the office early this coming week.      Scott A. Gerda Diss, MD  Electronically Signed     SAL/MEDQ  D:  09/09/2008  T:  09/09/2008  Job:  [id number]

## 2010-10-08 NOTE — H&P (Signed)
[name], [name]                         ACCOUNT NO.:  [id number]   MEDICAL RECORD NO.:  [id number]                   PATIENT TYPE:  AMB   LOCATION:  DAY                                  FACILITY:  [facility]   PHYSICIAN:  Jerolyn Shin C. Katrinka Blazing, M.D.                DATE OF BIRTH:  [birth year]   DATE OF ADMISSION:  DATE OF DISCHARGE:                                HISTORY & PHYSICAL   HISTORY OF PRESENT ILLNESS:  This is a 77 year old female with history of  multiple abdominal operations with resulting lower abdominal incisional  hernia, which has become progressively symptomatic.  The patient is having  more discomfort.  She has noted to have an increasing bulge.  She is  admitted for incisional hernia repair.   PAST HISTORY:  The patient has a history of:  1. Supraventricular tachycardia.  2. Chronic anemia.  3. Diverticulosis.  4. The patient has diverticulitis in 2001 and underwent Hartmann's     procedure.   PAST SURGICAL HISTORY:  Other surgeries include:  1. Colostomy closure in 2002.  2. Appendectomy, 2003.  3. Right inguinal hernia repair and incisional hernia repair in old     colostomy site.   MEDICATIONS:  Cardizem CD 120 daily.   ALLERGIES:  FLAGYL and SULFA.   REVIEW OF SYSTEMS:  Review of systems is totally unremarkable.   PHYSICAL EXAMINATION:  VITAL SIGNS:  On examination blood pressure 140/78,  pulse 80, respirations 20 and weight 156 pounds.  HEENT:  Unremarkable.  NECK:  Neck is supple without JVD or bruits.  CHEST:  Chest is clear to auscultation.  HEART:  Regular rate and rhythm without murmur, gallop or rub.  ABDOMEN:  Abdomen is soft and nontender.  Large lower midline incisional  hernia.  Normoactive bowel sounds.  EXTREMITIES:  No cyanosis, clubbing or edema.  NEUROLOGIC EXAMINATION:  No focal motor, sensory or cerebellar deficits.   IMPRESSION:  1. Incisional hernia.  2. History of supraventricular tachycardia.  3. Chronic anemia.  4. History of  diverticulitis and diverticulitis.   PLAN:  Probable mesh graft repair of incisional hernia.     ___________________________________________                                         Dirk Dress. Katrinka Blazing, M.D.   LCS/MEDQ  D:  07/03/2003  T:  07/04/2003  Job:  [id number]

## 2010-10-08 NOTE — H&P (Signed)
[name], [name]               ACCOUNT NO.:  [id number]   MEDICAL RECORD NO.:  [id number]          PATIENT TYPE:  EMS   LOCATION:  ED                            FACILITY:  [facility]   PHYSICIAN:  Scott A. Gerda Diss, MD    DATE OF BIRTH:  04-23-1924   DATE OF ADMISSION:  04/29/2006  DATE OF DISCHARGE:  LH                              HISTORY & PHYSICAL   CHIEF COMPLAINT:  Abdominal pain, nausea, and vomiting.   HISTORY OF PRESENT ILLNESS:  This is an 77 year old white female who had  onset of right-sided abdominal pain and discomfort on the evening of  April 29, 2006.  It started about 08:00 p.m. and she described it as  just sort of a nagging pain that became progressive, caused severe pain  and discomfort, and then some mild nausea, and then started having  vomiting and was unable to stop and the pain became more intense and she  came to the emergency department.  She denied any diarrhea.  Denied any  blood in her vomitus, and denied any recent rectal bleeding or  hematuria.  She has had some intermittent discomfort in the right lower  abdomen, but never with vomiting.  It was felt in the past it was  related in to a possibility of adhesions.   It should be noted that the patient had a diverticular abscess back in  2001 and had a resection in that area, along with repair of incisional  hernia back in 2002.  Then she had a colonoscopy in May of 2007 that  showed diverticula.   PAST MEDICAL HISTORY:  Diverticulitis, diverticulosis, hyperlipidemia,  osteoporosis, previous abdominal surgeries, atrial flutter resolved on  its own back in 2001, was on Cardizem, negative Cardiolite for ischemia  back in 2004 with a good ejection fraction around 78%.   FAMILY HISTORY:  Diabetes.   SOCIAL HISTORY:  Widowed.  Does not smoke.   ALLERGIES:  AMOXICILLIN CAUSED HIVES ON LAST DAY OF TAKING A 10-DAY  COURSE BACK IN 2005, BUT NOT LIFE-THREATENING, AND SHE HAS TAKEN OTHER  MEDICINES AND  ANTIBIOTICS, WITHOUT DIFFICULTY.  THE PATIENT ALSO RELATES  SULFA AND MORPHINE AS ALLERGIES.   LABORATORY:  Shows an elevated white count of 20,000 with a left shift,  and a hemoglobin that is within the normal range.  A BM ED  that shows  potassium of 3.4.  A urinalysis which was negative.  Liver enzymes which  are normal.  Lipase which was normal.  A CT scan showed small bowel  obstruction pattern, more proximal.   PHYSICAL EXAMINATION:  HEENT:  TMs NLT-NO.  CHEST:  CT had no crackles, respiratory rate is normal.  HEART:  Regular.  ABDOMEN:  Soft with mild right lower quadrant tenderness.  RECTAL EXAMINATION:  Negative with heme negative.  EXTREMITIES:  No edema.  NEUROLOGIC:  Normal.   ASSESSMENT/PLAN:  Early small bowel obstruction with possibility of this  being secondary to adhesions - will go ahead and cover with antibiotics,  recheck lab counts on Monday morning.  In addition to this, will consult  surgery.  Certainly, if patient does not turn the corner, this would be  an indication that may have to have surgery, but at this point in time,  that is uncertain and will go ahead and order some pain medicines and  nausea medicine, as well.  The patient will be admitted onto the floor.      Scott A. Gerda Diss, MD  Electronically Signed     SAL/MEDQ  D:  04/30/2006  T:  04/30/2006  Job:  [id number]

## 2010-10-08 NOTE — Discharge Summary (Signed)
NAMEMCKENSI, REDINGER               ACCOUNT NO.:  [id number]   MEDICAL RECORD NO.:  [id number]          PATIENT TYPE:  INP   LOCATION:  A311                          FACILITY:  [facility]   PHYSICIAN:  Scott A. Luking, MD    DATE OF BIRTH:  1924-02-09   DATE OF ADMISSION:  09/12/2008  DATE OF DISCHARGE:  04/25/2010LH                               DISCHARGE SUMMARY   HOSPITAL COURSE:  She was admitted in after experiencing palpitations  and feel like her heart was running fast.  She denied any chest  tightness, pressure or pain, or dizziness, did not pass out and she came  onto the emergency department to get checked out.  It should be noted  about a week ago, she had bad headache that woke her up in the middle of  night and brought her to the hospital.  MRI and MRA were negative.  An  LP was done and it was negative other than showing traumatic tap with  some blood.  She has been doing well since then until this happened.  She was admitted in.  She was put on Cardizem.  Cardiologist consulted,  but they were unable to tell him because of their responsibilities in  [place] and Dr. Dietrich Pates had recommended over the phone if the heart  rate got controlled, to titrate her off of her Cardizem and put her on  to diltiazem oral.  The patient was stable on September 13, 2008.  She went  back in a spontaneous normal sinus rhythm and she was on diltiazem 60 mg  p.o. q.6 h. and the patient was transferred to a standard room and on  September 14, 2008, was stable for discharge and was discharged to home on  Cardizem 180 CD and was instructed to follow up in our office within the  next couple of days and then Cardiology as we directed.  It should be  noted cardiac enzymes were negative.   DISCHARGE DIAGNOSES:  Atrial fibrillation, rapid ventricular response,  new onset with spontaneous resolution.      Scott A. Gerda Diss, MD  Electronically Signed     SAL/MEDQ  D:  10/15/2008  T:  10/15/2008  Job:   [id number]

## 2010-10-08 NOTE — Procedures (Signed)
   [name], [name]                         ACCOUNT NO.:  [id number]   MEDICAL RECORD NO.:  [id number]                   PATIENT TYPE:  EMS   LOCATION:  ED                                   FACILITY:  [facility]   PHYSICIAN:  Edward L. Juanetta Gosling, M.D.             DATE OF BIRTH:  1924/01/21   DATE OF PROCEDURE:  02/23/2003  DATE OF DISCHARGE:  02/23/2003                                EKG INTERPRETATION   DATE AND TIME OF TEST:  February 23, 2003 at 1455.   FINDINGS:  The rhythm is sinus tachycardia with a rate of about 150.  There  is an interventricular condition delay.  QT interval is prolonged which may  be due to electrolyte imbalance, drug effect, or primary myocardial disease.  Abnormal electrocardiogram.      ___________________________________________                                            Oneal Deputy. Juanetta Gosling, M.D.   ELH/MEDQ  D:  02/23/2003  T:  02/24/2003  Job:  [id number]

## 2010-10-08 NOTE — Letter (Signed)
September 20, 2006    Scott A. Gerda Diss, MD  [address], [address]  [place], [place] [address]   RE:  [name], [name]  MRN:  [id number]  /  DOB:  08/22/1923   Dear [name]:   [name] is referred to me by way of Dr. Vickey Huger, who evaluated [name]  for a gait disturbance.  As you know, [name] sometimes stumbles  and leans up against a chair or Katherine wall.  [name] absolutely denies that  [name] has any blackening of [name] vision, lightheadedness, or other symptoms  of cerebral hypoperfusion, but due to a pulse irregularity, Katherine  possibility was considered.  [name] basic neurologic assessment was  negative.  There are plans for intracerebral Doppler, but Katherine test has  not yet been performed.   From a cardiac standpoint, [name] has no known significant disease.  [name] has had borderline hypertension, and attempted therapy in Katherine past  resulted in symptomatic hypotension.  [name] has had chest discomfort with  an abnormal stress nuclear study in 2004.  [name] refused cardiac  catheterization at that time and has been well since.  Either brady  arrhythmias, an irregular pulse, or both have been detected in Katherine past.  I am unaware of any significant diagnosis of an arrhythmia.   PAST MEDICAL HISTORY:  Is mostly surgical.  [name] developed diverticular  disease with an abscess in late 2001, resulting in subsequent colostomy  and then repair of an incisional hernia.  [name] required appendectomy for  a ruptured appendix in 2003.  [name] had a remote repair of a right  inguinal hernia.   [name] has been treated with lipid lowering agents in Katherine past, but  not recently.  [name] only medication is Celexa 20 mg daily.  ALLERGIES TO  MORPHINE AND SULFA DRUGS HAVE BEEN REPORTED IN Katherine PAST.   SOCIAL HISTORY:  Remote employment with [employer].  Sedentary  lifestyle.  Widowed with one adult child.   FAMILY HISTORY:  Positive for diabetes and hyperlipidemia.   REVIEW OF SYSTEMS:  Notable for bilateral  implants following cataract  surgery and arthritic discomfort, particularly into [name] knees and back.   EXAMINATION:  Pleasant woman who is proportionate and in no acute  distress.  Katherine weight is 159, four pounds more than in November 2004.  Blood pressure 150/80 without orthostatic change, heart rate 80 and  regular, respirations 16.  HEENT:  Anicteric sclerae; normal lids and conjunctivae; normal oral  mucosa.  NECK:  No jugular venous distention; normal carotid upstrokes without  bruits.  ENDOCRINE:  No thyromegaly.  HEMATOPOIETIC:  No adenopathy.  SKIN:  No significant lesions.  LUNGS:  Clear.  CARDIAC:  Normal first and second heart sounds; fourth heart sound  present.  ABDOMEN:  Soft and nontender; no organomegaly.  EXTREMITIES:  Trace edema; distal pulses intact.  NEUROMUSCULAR:  Symmetric strength and tone; slight stumble when [name]  arose from a chair, but subsequent gait was steady and quite fluid.   EKG:  Normal sinus rhythm; left atrial abnormality; leftward axis;  delayed R wave progression.  As Katherine was compared with a prior tracing  of March 26, 2003, which was entirely normal.   IMPRESSION:  [name] has a minor gait disturbance of uncertain  etiology.  I think it unlikely that [name] has a significant arrhythmia,  [name] does not appear to have orthostatic hypotension.  [name] in fact has  some hypertension.  We will try chlorthalidone at a  minimal dose as  initial therapy.   [name] is scheduled to return to [name] Neurologist tomorrow.  Presumably,  intracranial Dopplers will be performed at that time.  Holter monitoring  will be performed due to [name] history of irregularity and bradycardia.  I  will reassess Katherine nice woman again in one month.  A chemistry profile  will be obtained in 3 weeks.    Sincerely,      Gerrit Friends. Dietrich Pates, MD, [facility]  Electronically Signed    RMR/MedQ  DD: 09/20/2006  DT: 09/20/2006  Job #: [id number]

## 2010-10-08 NOTE — Discharge Summary (Signed)
[name], [name]                           ACCOUNT NO.:  [id number]   MEDICAL RECORD NO.:  [id number]                    PATIENT TYPE:   LOCATION:                                       FACILITY:   PHYSICIAN:  Dirk Dress. Katrinka Blazing, M.D.                DATE OF BIRTH:   DATE OF ADMISSION:  12/10/2001  DATE OF DISCHARGE:  12/17/2001                                 DISCHARGE SUMMARY   DISCHARGE DIAGNOSES:  1. Acute gangrenous appendicitis with perforation.  2. History of supraventricular tachycardia.  3. Chronic anemia.  4. Postoperative ileus.  5. Atrial fibrillation with fast ventricular response treated with FLAGYL.  6. Allergic reaction to FLAGYL.   SPECIAL PROCEDURE:  Appendectomy.   DISPOSITION:  The patient discharged home in stable satisfactory condition.   DISCHARGE MEDICATIONS:  1. Cleocin 300 mg q.i.d. x7 days.  2. Levaquin 500 mg q.d. x7 days.  3. Lortab 10/500 q.i.d. p.r.n.  4. Phenergan 25 mg q.6h. p.r.n. nausea.  5. Cardizem CD 180 mg q.d.   FOLLOW UP:  The patient was scheduled to be seen in the office on December 24, 2001.   SUMMARY:  The patient is a 77 year old female with history of recurrent  abdominal pain in her lower abdomen x3 days.  The pain became more severe  and she had difficulty voiding and walking.  She had fever and chills.  She  was seen in the emergency room where her white count was 14,600.  CT of the  abdomen showed major inflammatory changes in the right lower quadrant with  free fluid and extraluminal gas compatible with a ruptured appendix.  She  was scheduled for exploratory laparotomy.  The patient had a history of  diverticulitis status post Hartman's procedure with later colostomy closure  in March 2002.  She also had incisional hernia repair in October 2002.  She  also had a recurrent incisional hernia.  There is also a history of SVT in  the past.  The patient was admitted, prepared with IV Cefotan and Cleocin  and taken to the  operating room.  At the time of operation she was found to  have a retrocecal appendix with gangrenous changes and perforation with free  fecaliths in the retrocecal area.  Appendectomy was done and the area was  drained.  The patient had a slightly prolonged course but it was felt that  this was necessary because of her blocked inflammatory response in the right  lower quadrant.  Because she was unable to take p.o. and was off of her  medications she developed SVT which was not unexpected since she had had it  on each admission in the past.  This was treated with Cardizem and resolved.  By December 13, 2001 she was feeling better though she was still febrile.  JP  drainage was mostly serous.  Her SVT had resolved.  She  was continued on  antibiotics for the next 4 days.  She had a probable reaction to FLAGYL and  this was discontinued.  White count  returned to normal and she became afebrile by December 17, 2001.  At that time  she was stable, felt much better, tolerating a diet.  Her wound was healing  without difficulty.  JP drain was discontinued and she was discharged home  with plans for followup in the office 1 week post discharge.                                               Dirk Dress. Katrinka Blazing, M.D.    LCS/MEDQ  D:  05/11/2002  T:  05/13/2002  Job:  [id number]

## 2010-10-08 NOTE — Procedures (Signed)
   NAMEMARLANA, [name]                           ACCOUNT NO.:  [id number]   MEDICAL RECORD NO.:  [id number]                    PATIENT TYPE:   LOCATION:                                       FACILITY:   PHYSICIAN:  Scott A. Gerda Diss, M.D.               DATE OF BIRTH:   DATE OF PROCEDURE:  DATE OF DISCHARGE:                                EKG INTERPRETATION   PROCEDURE:  EKG.   INTERPRETATION:  Normal sinus rhythm.  No acute ST segment changes.  Normal  EKG.                                               Scott A. Gerda Diss, M.D.    SAL/MEDQ  D:  05/05/2002  T:  05/06/2002  Job:  [id number]

## 2010-10-08 NOTE — Discharge Summary (Signed)
NAMEPASCALE, [name]                         ACCOUNT NO.:  [id number]   MEDICAL RECORD NO.:  [id number]                   PATIENT TYPE:  INP   LOCATION:  A225                                 FACILITY:  [facility]   PHYSICIAN:  Jerolyn Shin C. Katrinka Blazing, M.D.                DATE OF BIRTH:  [birth year]   DATE OF ADMISSION:  07/04/2003  DATE OF DISCHARGE:  07/09/2003                                 DISCHARGE SUMMARY   DISCHARGE DIAGNOSES:  1. Recurrent incisional hernia.  2. History of supraventricular tachycardia.  3. Chronic anemia.  4. History of diverticulosis.   SPECIAL PROCEDURE:  Mesh graft repair of incisional hernia on February 11.   DISPOSITION:  The patient is discharged home in stable satisfactory  condition.   DISCHARGE MEDICATIONS:  1. Cardizem CD 180 mg 1 p.o. daily.  2. Levaquin 500 mg daily x5 days.  3. Tylox 1 q.4h. p.r.n. pain.   FOLLOW UP:  The patient is scheduled to be seen in the office in 2 weeks  post discharge.  She will have home health nursing for dressing changes and  management of her JP drains until she returns to the office.   SUMMARY:  A 77 year old female with history of multiple abdominal operations  with a resulting lower abdominal incisional hernia which had become  progressively symptomatic.  Because of increasing discomfort and increasing  bulge the patient is scheduled for incisional hernia repair.  She had a  history of diverticulitis and underwent Hartman's procedure in 2001.  She  later had closure of her colostomy in 2002.  She has had repair of an  incisional hernia in the old colostomy site.  History is positive for some  ventricular tachycardia and chronic anemia.  Her only medication was  Cardizem CD 180 p.o. daily.   HOSPITAL COURSE:  The patient was admitted through Day Surgery for mesh  graft repair of an incisional hernia using a 6 x 6-inch sheet of Pyrolite  mesh suture __________ x 6-inch sheet of Vicryl knitted mesh graft.  This  was  sutured in place with the Vicryl side placed over the visceral surface.  The patient had a very uneventful postoperative course.  Vital signs were  stable.  She had low-grade temperature on the first day, but otherwise was  afebrile.  White count remained normal.  Her diet was advanced without  difficulty.  She did not have any tachycardia.  She remained stable and was  discharged home on the fifth postoperative day in satisfactory condition.     ___________________________________________                                         Dirk Dress Katrinka Blazing, M.D.   LCS/MEDQ  D:  08/03/2003  T:  08/04/2003  Job:  [id number]

## 2010-10-08 NOTE — Op Note (Signed)
   [name], [name]                           ACCOUNT NO.:  [id number]   MEDICAL RECORD NO.:  [id number]                    PATIENT TYPE:   LOCATION:                                       FACILITY:   PHYSICIAN:  Dirk Dress. Katrinka Blazing, M.D.                DATE OF BIRTH:   DATE OF PROCEDURE:  12/10/2001  DATE OF DISCHARGE:                                 OPERATIVE REPORT   PREOPERATIVE DIAGNOSES:  Acute appendicitis with perforation.   POSTOPERATIVE DIAGNOSES:  Acute appendicitis with perforation.   PROCEDURE:  Appendectomy.   SURGEON:  Dirk Dress. Katrinka Blazing, M.D.   DESCRIPTION OF PROCEDURE:  Under general anesthesia, the patient's abdomen  was prepped and draped in a sterile field.  A standard transverse [place] incision was made.  The muscles were bluntly dissected.  The  peritoneum was opened.  A fair amount of purulent fluid was encountered.  There was moderate inflammation of the cecum.  The incision was extended  medially for better exposure.  The cecum was mobilized.  The appendix was  retrocecal, and it was gangrenous with perforation.  There were two lush  fecaliths in the retrocecal area.  The appendix was dissected.  The vessels  over the mesoappendix were tied with 3-0 Dexon.  The base of the appendix  was inflamed, so it was initially tied with two sutures of 3-0 silk.  It was  then clamped and transected.  The base could not be invaginated with a  pursestring, so it was oversewn with multiple interrupted 3-0 silks.  Irrigation was carried out.  Two JP drains were placed, one in the deep  pelvis and another in the right gutter.  They were brought out through  separate stab incisions.  The pelvis and right gutter were irrigated.  The  peritoneum was closed with 2-0 Biosyn.  The muscle layers were closed  individually with 2-0 and 0 Biosyn.  Each layer was irrigated.  The  subcutaneous tissue was irrigated and then closed with 3-0 Biosyn.  The skin  was closed with staples.   The patient tolerated the procedure well.  She was  awakened from anesthesia, transferred to the bed, and taken to the ICU for  postanesthetic monitoring.                                               Dirk Dress. Katrinka Blazing, M.D.    LCS/MEDQ  D:  05/11/2002  T:  05/12/2002  Job:  [id number]

## 2010-10-08 NOTE — Discharge Summary (Signed)
NAMELUBNA, STEGEMAN               ACCOUNT NO.:  [id number]   MEDICAL RECORD NO.:  [id number]          PATIENT TYPE:  INP   LOCATION:  A327                          FACILITY:  [facility]   PHYSICIAN:  Scott A. Gerda Diss, MD    DATE OF BIRTH:  1924/02/05   DATE OF ADMISSION:  04/30/2006  DATE OF DISCHARGE:  12/11/2007LH                               DISCHARGE SUMMARY   DISCHARGE DIAGNOSIS:  Small bowel obstruction.   HOSPITAL COURSE:  The patient presented to the hospital with severe  abdominal pain and discomfort in the middle of eating; unable to keep  anything down, slight distention to her abdomen.  In the emergency  department, they did blood work and her initial white count was  significantly elevated.  Liver enzymes looked good.  The patient also  had bacteria in her urine and a CT scan showed mild air-fluid levels  with mild dilatation.  No masses.  She was put on NG suction.  She was  also put on antibiotics and she gradually improved, and on the evening  of the 10th, we were able to pull the NG tube because the small bowel  obstruction looked like it may have resolved.  She had good bowel  sounds.  Abdomen was soft.  She was able to tolerate liquids that night  and solids the next morning and was able to be discharged to home in  good condition, to take antibiotics and to follow up in the office  within approximately 1 week's time.      Scott A. Gerda Diss, MD  Electronically Signed     SAL/MEDQ  D:  05/26/2006  T:  05/26/2006  Job:  [id number]

## 2010-10-08 NOTE — Op Note (Signed)
NAMEOSA, FOGARTY               ACCOUNT NO.:  [id number]   MEDICAL RECORD NO.:  [id number]          PATIENT TYPE:  AMB   LOCATION:  DAY                           FACILITY:  [facility]   PHYSICIAN:  Lionel December, M.D.    DATE OF BIRTH:  08/04/1923   DATE OF PROCEDURE:  10/20/2005  DATE OF DISCHARGE:                                 OPERATIVE REPORT   PROCEDURE:  Colonoscopy.   INDICATIONS:  [name] is a 77 year old Caucasian female who is undergoing  average risk screening colonoscopy.  Procedure risks were reviewed with the  patient, informed consent was obtained.   MEDS FOR CONSCIOUS SEDATION:  Demerol 25 mg IV, Versed 3 mg IV.   FINDINGS:  Procedure performed in endoscopy suite.  The patient's vital  signs and O2 sat were monitored during the procedure and remained stable.  The patient was placed left lateral position and rectal examination  performed.  No abnormality noted external or digital exam.  Olympus  videoscope was placed rectum and advanced under vision into sigmoid colon.  Anastomosis was felt to be at 20 cm from the anal margin.  She is status  post resection of part of sigmoid colon for complicated diverticulitis 5  years ago.  She had few diverticula distal to anastomosis and scattered  diverticula at proximal sigmoid and descending colon and one at the  ascending colon above ileocecal valve.  Very redundant colon, by using  abdominal pressure and changing position, able to pass the scope into cecum.  Blunt end of cecum was normal.  The inferior lip of the ileocecal valve was  lipomatous, appeared to be almost pedunculated lipoma but mucosa was normal.  Was left alone.  Pictures taken for the record.  As the scope was withdrawn  colonic mucosa was examined for the second time and there were no polyps  and/or tumor masses.  While in the rectum, scope was retroflexed to examine  anorectal junction and small hemorrhoids were noted below the dentate line.  Endoscope was  straightened and withdrawn.  The patient tolerated the  procedure well.   FINAL DIAGNOSIS:  Left-sided diverticulosis with one diverticulum at  descending colon.  Patent colonic anastomosis at 20 cm from the anal margin.  External hemorrhoids.   RECOMMENDATIONS:  High-fiber diet.  Fiber supplement 3-4 grams daily.   She should continue yearly Hemoccults but would not need another screening  exam in 10 years.      Lionel December, M.D.  Electronically Signed     NR/MEDQ  D:  10/20/2005  T:  10/20/2005  Job:  [id number]   cc:   [name] A. Gerda Diss, MD  Fax: [phone]

## 2010-10-08 NOTE — Op Note (Signed)
NAMELAMARA, BRECHT                         ACCOUNT NO.:  [id number]   MEDICAL RECORD NO.:  [id number]                   PATIENT TYPE:  AMB   LOCATION:  DAY                                  FACILITY:  [facility]   PHYSICIAN:  Jerolyn Shin C. Katrinka Blazing, M.D.                DATE OF BIRTH:  07/13/1923   DATE OF PROCEDURE:  DATE OF DISCHARGE:                                 OPERATIVE REPORT   PREOPERATIVE DIAGNOSIS:  Incisional hernia.   POSTOPERATIVE DIAGNOSIS:  Incisional hernia.   PROCEDURE:  Mesh graft repair of incisional hernia.   SURGEON:  Dirk Dress. Katrinka Blazing, M.D.   DESCRIPTION OF PROCEDURE:  Under general anesthesia, the patient's abdomen  was prepped and draped in a sterile field.   A lower midline incision was made.  There was marked attenuation of the  fascia with total separation of the fascia of the midline extending from the  umbilicus down to the pubic symphysis.  The running suture was still intact  but was not attached to fascia.  The area of the old stoma in the left lower  quadrant did not show any evidence of herniation.  Once the adhesions to the  anterior abdominal wall were taken down, the skin and subcutaneous fat was  separated from the fascia circumferentially for a distance of about 4 cm.  A  piece of polypropylene mesh graft, 6 x 6 inches, and Vicryl knitted mesh  graft, 6 x 6 inches, were sutured together using running 0 Prolene.  The  composite graft was then sutured to the fascia with the Vicryl knitted mesh  graft over the visceral surface.  The graft was sutured circumferentially  with interrupted 0 Prolene.  Once this was adequately done and the sponge,  needle, instrument, and blade counts were verified as correct, the sutures  were tied.  It appeared to be a good repair, with good support.  The  attenuated fascia was then closed over the mesh with running 0 Prolene.  JP  drains were placed over the fascia and brought out through two lower stab  incisions.  Copious  irrigation was carried out.  The subcutaneous tissue was  irrigated with saline.  The subcutaneous fat was closed with 2-0 Monocryl.  The skin was closed with staples.  The drains were secured with 3-0 nylon.   The patient tolerated the procedure well.  Sterile dressings were placed.  She was awakened from anesthesia uneventfully, transferred to a bed, and  taken to the postanesthetic care unit for further monitoring.      ___________________________________________                                            Dirk Dress. Katrinka Blazing, M.D.   LCS/MEDQ  D:  07/04/2003  T:  07/04/2003  Job:  [id number]   cc:   Ramon Dredge L. Juanetta Gosling, M.D.  [address]  [place]  [place] [address]  Fax: [phone]

## 2010-10-08 NOTE — Procedures (Signed)
   NAMESHAKILA, MAK                         ACCOUNT NO.:  [id number]   MEDICAL RECORD NO.:  [id number]                   PATIENT TYPE:  PREC   LOCATION:                                       FACILITY:  [facility]   PHYSICIAN:  Scott A. Gerda Diss, M.D.               DATE OF BIRTH:  July 30, 1923   DATE OF PROCEDURE:  10/04/2002  DATE OF DISCHARGE:  09/27/2002                                    STRESS TEST   INDICATION:  Chest discomfort.   FINDINGS:  Resting EKG:  No acute changes are noted.  Blood pressure 132/72.   Rhythm response to exercise:  The patient had no arrhythmias.   Symptomatology:  The patient had some shortness of breath and fatigue.   Reason for the test ceasing was fatigue.   ST segment response to exercise:  The patient had some ST segment depression  in the lateral leads of V5 and some at V6 with V5 being almost at 1.0 at 0.8  past the J point.   Recovery phase:  Uneventful.  ST segments rapidly corrected.  No  arrhythmias, no symptomatology.   ASSESSMENT:  Stress test - abnormal stress portion with poor exercise  tolerance and ST segment depression in V5.  Await Cardiolite imaging at this  point.  No symptoms in regards to chest discomfort with the exercise,  though.                                               Scott A. Gerda Diss, M.D.    SAL/MEDQ  D:  10/04/2002  T:  10/04/2002  Job:  [id number]

## 2010-10-08 NOTE — H&P (Signed)
[facility]  Patient:    [name], [name] Visit Number: [id number] MRN: [id number]          Service Type: DSU Location: DAY Attending Physician:  Dessa Phi Dictated by:   Elpidio Anis, M.D. Admit Date:  02/26/2001                           History and Physical  OLD MR# [id number]  CHIEF COMPLAINT: This is a 77 year old female, with a history of diverticulitis, status post Gertie Gowda procedure, followed by colostomy closure in March 2002.  HISTORY OF PRESENT ILLNESS: She has had gradual enlargement of mass in the colostomy site.  She has developed an incisional hernia.  The hernia is becoming more symptomatic.  She is now scheduled for incisional hernia repair.  PAST MEDICAL HISTORY:  1. History of supraventricular tachycardia.  2. Anemia.  3. Diverticulitis.  No other chronic illnesses.  CHRONIC MEDICATIONS: None.  PAST SURGICAL HISTORY: Other surgery was a right inguinal hernia repair.  ALLERGIES: SULFA.  MEDICATIONS:  1. Digoxin 0.25 mg q.d.  2. Xanax 0.5 mg q.h.s.  PHYSICAL EXAMINATION:  VITAL SIGNS: Blood pressure 132/74, pulse 76, respirations 18.  Weight 141 pounds.  HEENT: No abnormality.  NECK: Supple, without JVD or bruits.  CHEST: Clear to auscultation.  HEART: Regular rate and rhythm without murmur, gallop, or rub.  ABDOMEN: Soft, nondistended.  Well-healed midline incision, with fascial defect in the left lower quadrant in the area of previous colostomy, with a mass which increases with Valsalva.  EXTREMITIES: No clubbing, cyanosis, or edema.  NEUROLOGIC: No focal deficits.  IMPRESSION:  1. Incisional hernia.  2. History of diverticulitis.  3. History of supraventricular tachycardia.  PLAN: Incisional hernia repair. Dictated by:   Elpidio Anis, M.D. Attending Physician:  Dessa Phi DD:  02/27/01 TD:  02/27/01 Job: [id number]

## 2010-10-08 NOTE — Op Note (Signed)
[facility]  Patient:    [name], [name] Visit Number: [id number] MRN: [id number]          Service Type: OBS Location: 3 A332 01 Attending Physician:  Dessa Phi Dictated by:   Elpidio Anis, M.D. Admit Date:  02/27/2001   CC:         Mel Almond, M.D.   Operative Report  PREOPERATIVE DIAGNOSIS:  Incisional hernia.  POSTOPERATIVE DIAGNOSIS:  Incisional hernia.  PROCEDURE:  Incisional hernia repair.  SURGEON:  Elpidio Anis, M.D.  ANESTHESIA:  General LMA.  DESCRIPTION OF PROCEDURE:  Under general LMA anesthesia, the abdomen was prepped and draped in a sterile field.  A transverse incision was made over the old colostomy site.  The incision was extended into the deep subcutaneous tissue.  The large hernia sac was encountered.  It was dissected down to the fascia.  The hernia sac and and attenuated fascia were excised without difficulty.  After good margins were obtained, it was felt that the patient had adequate tissue to have a primary closure.  The wound margins were closed longitudinally using interrupted #1 Prolene in a figure-of-eight pattern.  The closure was not under much tension.  Sponge, needle, instrument and blade counts were verified as correct.  Subcutaneous tissue was closed using running 2-0 Biosyn.  Skin was closed with subcuticular 4-0 Dexon.  A sterile dressing was placed.  She was awakened from anesthesia uneventfully, transferred to a bed and taken to the postanesthetic care unit. Dictated by:   Elpidio Anis, M.D. Attending Physician:  Dessa Phi DD:  02/27/01 TD:  02/28/01 Job: [id number]

## 2010-10-12 ENCOUNTER — Ambulatory Visit (HOSPITAL_COMMUNITY)
Admission: RE | Admit: 2010-10-12 | Discharge: 2010-10-12 | Disposition: A | Discharge status: Home / Self Care | Payer: Medicare Other | Source: Ambulatory Visit | Attending: Ophthalmology | Admitting: Ophthalmology

## 2010-10-12 DIAGNOSIS — Z7901 Long term (current) use of anticoagulants: Secondary | ICD-10-CM | POA: Insufficient documentation

## 2010-10-12 DIAGNOSIS — H26499 Other secondary cataract, unspecified eye: Secondary | ICD-10-CM | POA: Insufficient documentation

## 2010-10-27 ENCOUNTER — Other Ambulatory Visit: Payer: Self-pay | Admitting: Family Medicine

## 2010-10-27 ENCOUNTER — Ambulatory Visit (HOSPITAL_COMMUNITY)
Admission: RE | Admit: 2010-10-27 | Discharge: 2010-10-27 | Disposition: A | Discharge status: Home / Self Care | Payer: Medicare Other | Source: Ambulatory Visit | Attending: Family Medicine | Admitting: Family Medicine

## 2010-10-27 DIAGNOSIS — M5137 Other intervertebral disc degeneration, lumbosacral region: Secondary | ICD-10-CM | POA: Insufficient documentation

## 2010-10-27 DIAGNOSIS — M545 Low back pain, unspecified: Secondary | ICD-10-CM

## 2010-10-27 DIAGNOSIS — M51379 Other intervertebral disc degeneration, lumbosacral region without mention of lumbar back pain or lower extremity pain: Secondary | ICD-10-CM | POA: Insufficient documentation

## 2010-11-02 ENCOUNTER — Ambulatory Visit (HOSPITAL_COMMUNITY)
Admission: RE | Admit: 2010-11-02 | Discharge: 2010-11-02 | Disposition: A | Discharge status: Home / Self Care | Payer: Medicare Other | Source: Ambulatory Visit | Attending: Ophthalmology | Admitting: Ophthalmology

## 2010-11-02 DIAGNOSIS — Z79899 Other long term (current) drug therapy: Secondary | ICD-10-CM | POA: Insufficient documentation

## 2010-11-02 DIAGNOSIS — H26499 Other secondary cataract, unspecified eye: Secondary | ICD-10-CM | POA: Insufficient documentation

## 2010-11-02 DIAGNOSIS — I10 Essential (primary) hypertension: Secondary | ICD-10-CM | POA: Insufficient documentation

## 2010-12-31 ENCOUNTER — Other Ambulatory Visit: Payer: Self-pay | Admitting: Urology

## 2010-12-31 ENCOUNTER — Ambulatory Visit (INDEPENDENT_AMBULATORY_CARE_PROVIDER_SITE_OTHER): Payer: Medicare Other | Admitting: Urology

## 2010-12-31 DIAGNOSIS — IMO0002 Reserved for concepts with insufficient information to code with codable children: Secondary | ICD-10-CM

## 2010-12-31 DIAGNOSIS — N952 Postmenopausal atrophic vaginitis: Secondary | ICD-10-CM

## 2010-12-31 DIAGNOSIS — N390 Urinary tract infection, site not specified: Secondary | ICD-10-CM

## 2010-12-31 DIAGNOSIS — N3946 Mixed incontinence: Secondary | ICD-10-CM

## 2011-01-04 ENCOUNTER — Ambulatory Visit (HOSPITAL_COMMUNITY)
Admission: RE | Admit: 2011-01-04 | Discharge: 2011-01-04 | Disposition: A | Discharge status: Home / Self Care | Payer: Medicare Other | Source: Ambulatory Visit | Attending: Urology | Admitting: Urology

## 2011-01-04 DIAGNOSIS — Q619 Cystic kidney disease, unspecified: Secondary | ICD-10-CM | POA: Insufficient documentation

## 2011-01-04 DIAGNOSIS — IMO0002 Reserved for concepts with insufficient information to code with codable children: Secondary | ICD-10-CM

## 2011-01-07 ENCOUNTER — Other Ambulatory Visit: Payer: Self-pay | Admitting: Urology

## 2011-01-07 DIAGNOSIS — N281 Cyst of kidney, acquired: Secondary | ICD-10-CM

## 2011-01-12 ENCOUNTER — Ambulatory Visit (HOSPITAL_COMMUNITY)
Admission: RE | Admit: 2011-01-12 | Discharge: 2011-01-12 | Disposition: A | Discharge status: Home / Self Care | Payer: Medicare Other | Source: Ambulatory Visit | Attending: Urology | Admitting: Urology

## 2011-01-12 ENCOUNTER — Other Ambulatory Visit: Payer: Self-pay | Admitting: Urology

## 2011-01-12 DIAGNOSIS — N281 Cyst of kidney, acquired: Secondary | ICD-10-CM

## 2011-01-12 DIAGNOSIS — N289 Disorder of kidney and ureter, unspecified: Secondary | ICD-10-CM | POA: Insufficient documentation

## 2011-01-12 DIAGNOSIS — K5710 Diverticulosis of small intestine without perforation or abscess without bleeding: Secondary | ICD-10-CM | POA: Insufficient documentation

## 2011-01-12 MED ORDER — GADOBENATE DIMEGLUMINE 529 MG/ML IV SOLN
14.0000 mL | Freq: Once | INTRAVENOUS | Status: AC | PRN
  Administered 2011-01-12: 14 mL via INTRAVENOUS

## 2011-01-12 MED ORDER — GADOBENATE DIMEGLUMINE 529 MG/ML IV SOLN: 14.0000 mL | Freq: Once | INTRAVENOUS | Status: AC | PRN

## 2011-01-13 ENCOUNTER — Emergency Department (HOSPITAL_COMMUNITY): Payer: Medicare Other

## 2011-01-13 ENCOUNTER — Emergency Department (HOSPITAL_COMMUNITY)
Admission: EM | Admit: 2011-01-13 | Discharge: 2011-01-13 | Disposition: A | Discharge status: Home / Self Care | Payer: Medicare Other | Attending: Emergency Medicine | Admitting: Emergency Medicine

## 2011-01-13 ENCOUNTER — Other Ambulatory Visit: Payer: Self-pay

## 2011-01-13 ENCOUNTER — Encounter (HOSPITAL_COMMUNITY): Payer: Self-pay | Admitting: Emergency Medicine

## 2011-01-13 DIAGNOSIS — Z7901 Long term (current) use of anticoagulants: Secondary | ICD-10-CM | POA: Insufficient documentation

## 2011-01-13 DIAGNOSIS — I10 Essential (primary) hypertension: Secondary | ICD-10-CM | POA: Insufficient documentation

## 2011-01-13 DIAGNOSIS — Z859 Personal history of malignant neoplasm, unspecified: Secondary | ICD-10-CM | POA: Insufficient documentation

## 2011-01-13 DIAGNOSIS — Z888 Allergy status to other drugs, medicaments and biological substances status: Secondary | ICD-10-CM | POA: Insufficient documentation

## 2011-01-13 DIAGNOSIS — I4891 Unspecified atrial fibrillation: Secondary | ICD-10-CM

## 2011-01-13 DIAGNOSIS — Z880 Allergy status to penicillin: Secondary | ICD-10-CM | POA: Insufficient documentation

## 2011-01-13 DIAGNOSIS — Z882 Allergy status to sulfonamides status: Secondary | ICD-10-CM | POA: Insufficient documentation

## 2011-01-13 LAB — BASIC METABOLIC PANEL
Calcium: 9.3 mg/dL (ref 8.4–10.5)
GFR calc non Af Amer: >60 mL/min (ref >60)
Sodium: 142 mEq/L (ref 135–145)

## 2011-01-13 LAB — PROTIME-INR
INR: 2.1 — ABNORMAL HIGH (ref 0.00–1.49)
Prothrombin Time: 23.9 seconds — ABNORMAL HIGH (ref 11.6–15.2)

## 2011-01-13 LAB — CBC
MCH: 28.6 pg (ref 26.0–34.0)
MCHC: 33.3 g/dL (ref 30.0–36.0)
MCV: 85.9 fL (ref 78.0–100.0)
Platelets: 276 10*3/uL (ref 150–400)
RDW: 12.7 % (ref 11.5–15.5)
WBC: 7.5 10*3/uL (ref 4.0–10.5)

## 2011-01-13 LAB — DIFFERENTIAL
Basophils Absolute: 0 10*3/uL (ref 0.0–0.1)
Basophils Relative: 0 % (ref 0–1)
Eosinophils Absolute: 0.2 10*3/uL (ref 0.0–0.7)
Eosinophils Relative: 2 % (ref 0–5)

## 2011-01-13 MED ORDER — DILTIAZEM HCL 25 MG/5ML IV SOLN
10.0000 mg | Freq: Once | INTRAVENOUS | Status: AC
  Administered 2011-01-13: 10 mg via INTRAVENOUS
  Filled 2011-01-13: qty 5

## 2011-01-13 NOTE — ED Notes (Signed)
Presents with c/o palpitations and "heart racing"; cardiac monitor shows HR 144 and irregular; c/o shortness of breath; denies pain; reports onset of symptoms this morning; reports hx of arrhythmia and is on medication for same, but states per her heart monitor at home, she could not tell if the rate was too high or too low because it would not register, so she did not take her heart medication. A&Ox4; answers questions appropriately; Placed on O2 2l/min per [place].

## 2011-01-13 NOTE — ED Notes (Signed)
Took patient to restroom. 

## 2011-01-13 NOTE — ED Notes (Signed)
Cardiac monitor shows HR 90s to 130s, irregular, with occasional unifocal PVC.  Pt with hx of afib.  In no distress; reports decreased feeling of SOB and palpitations.

## 2011-01-13 NOTE — ED Notes (Signed)
Patient with no complaints at this time. Respirations even and unlabored. Skin warm/dry. Discharge instructions reviewed with patient at this time. Patient given opportunity to voice concerns/ask questions. IV removed per policy and band-aid applied to site. Patient discharged at this time and left Emergency Department with steady gait.  

## 2011-01-13 NOTE — ED Provider Notes (Signed)
History   Scribed for Benny Lennert, MD, the patient was seen in room APA10/APA10. This chart was scribed by Clarita Crane. This patient's care was started at 11:55AM.   CSN: [id number] Arrival date & time: 01/13/2011 11:23 AM  Chief Complaint  Patient presents with  . Tachycardia   HPI [name] is a 77 y.o. female who presents to the Emergency Department complaining of constant palpitations with associated weakness, mild HA and mild SOB onset this morning upon awaking and persistent since. Denies dizziness, chest pain, abdominal pain, nausea, vomiting. Patient reports she did not take her Cardizem-60mg  this morning because she was not sure if her palpitations were because her blood pressure too high or too low. Patient notes she is followed by [facility] in [place], [place]. Also reports she was dx with UTI 2 weeks ago and and is currently taking abx for treatment. Patient with h/o cancer and hypertension.  HPI ELEMENTS:  Onset: this morning upon awaking Duration: persistent since onset  Timing: constant  Context:  as above  Associated symptoms:  +weakness, mild HA and mild SOB. Denies dizziness, chest pain, abdominal pain, nausea, vomiting.  PAST MEDICAL HISTORY:  Past Medical History  Diagnosis Date  . Cancer   . Hypertension     PAST SURGICAL HISTORY:  History reviewed. No pertinent past surgical history.  MEDICATIONS:  Previous Medications   ACETAMINOPHEN (TYLENOL) 325 MG TABLET    Take 650 mg by mouth every 6 (six) hours as needed. For pain    ALPRAZOLAM (XANAX) 0.5 MG TABLET    Take 0.5 mg by mouth at bedtime.     B COMPLEX-C (B-COMPLEX WITH VITAMIN C) TABLET    Take 1 tablet by mouth daily.     DILTIAZEM (CARDIZEM) 60 MG TABLET    Take 60 mg by mouth daily.     METOPROLOL SUCCINATE (TOPROL-XL) 25 MG 24 HR TABLET    Take 25 mg by mouth daily.     OMEPRAZOLE (PRILOSEC) 20 MG CAPSULE    Take 20 mg by mouth every 3 (three) days.     VITAMIN B-12  (CYANOCOBALAMIN) 100 MCG TABLET    Take 100 mcg by mouth daily.     WARFARIN (COUMADIN) 5 MG TABLET    Take 2.5-5 mg by mouth daily. Takes 2.5 mg on Wednesday & Friday and takes 5 mg all other days      ALLERGIES:  Allergies as of 01/13/2011 - Review Complete 01/13/2011  Allergen Reaction Noted  . Cephalosporins Other (See Comments) 01/13/2011  . Morphine and related Itching 08/02/2010  . Penicillins Other (See Comments) 08/02/2010  . Sulfa antibiotics Other (See Comments) 08/02/2010     FAMILY HISTORY:  History reviewed. No pertinent family history.   SOCIAL HISTORY: History   Social History  . Marital Status: Widowed    Spouse Name: N/A    Number of Children: N/A  . Years of Education: N/A   Social History Main Topics  . Smoking status: Never Smoker   . Smokeless tobacco: None  . Alcohol Use: No  . Drug Use: No  . Sexually Active:    Other Topics Concern  . None   Social History Narrative  . None    OB History    Grav Para Term Preterm Abortions TAB SAB Ect Mult Living   2 1   1  1   1       Review of Systems  Constitutional: Negative for fatigue.  HENT: Negative  for congestion, sinus pressure and ear discharge.   Eyes: Negative for discharge.  Respiratory: Positive for shortness of breath. Negative for cough.   Cardiovascular: Positive for palpitations. Negative for chest pain.  Gastrointestinal: Negative for nausea, vomiting, abdominal pain and diarrhea.  Genitourinary: Negative for frequency and hematuria.  Musculoskeletal: Negative for back pain.  Skin: Negative for rash.  Neurological: Positive for weakness and headaches. Negative for dizziness and seizures.  Hematological: Negative.   Psychiatric/Behavioral: Negative for hallucinations.    Physical Exam  BP 133/74  Pulse 59  Resp 19  Ht 5\' 4"  (1.626 m)  Wt 152 lb (68.947 kg)  BMI 26.09 kg/m2  SpO2 89%  Physical Exam  Nursing note and vitals reviewed. Constitutional: She is oriented to  person, place, and time. She appears well-developed and well-nourished. No distress.  HENT:  Head: Normocephalic and atraumatic.  Mouth/Throat: Oropharynx is clear and moist.  Eyes: Conjunctivae and EOM are normal. No scleral icterus.  Neck: Neck supple.  Cardiovascular: An irregular rhythm present. Tachycardia present.  Exam reveals no gallop and no friction rub.   No murmur heard.      DP and PT pulses intact.   Pulmonary/Chest: Effort normal. She has no wheezes. She has no rales. She exhibits no tenderness.  Abdominal: Soft. She exhibits no distension. There is no tenderness.  Musculoskeletal: Normal range of motion. She exhibits no edema.  Neurological: She is alert and oriented to person, place, and time. Coordination normal.  Skin: Skin is warm and dry.  Psychiatric: She has a normal mood and affect. Her behavior is normal.    ED Course  Procedures  OTHER DATA REVIEWED: Nursing notes, vital signs, and past medical records reviewed. Lab results reviewed and considered Imaging results reviewed and considered EKG evaluated and considered  DIAGNOSTIC STUDIES: Oxygen Saturation is 97% on nasal canula-2L, normal by my interpretation.     Date: 01/13/2011  Rate: 145  Rhythm: Rapid Atrial Fibrillation   QRS Axis: normal  Intervals: normal  ST/T Wave abnormalities: normal  Conduction Disutrbances:none  Narrative Interpretation:   Old EKG Reviewed: unchanged   LABS / RADIOLOGY: Results for orders placed during the hospital encounter of 01/13/11  CBC      Component Value Range   WBC 7.5  4.0 - 10.5 (K/uL)   RBC 4.82  3.87 - 5.11 (MIL/uL)   Hemoglobin 13.8  12.0 - 15.0 (g/dL)   HCT 62.9  52.8 - 41.3 (%)   MCV 85.9  78.0 - 100.0 (fL)   MCH 28.6  26.0 - 34.0 (pg)   MCHC 33.3  30.0 - 36.0 (g/dL)   RDW 24.4  01.0 - 27.2 (%)   Platelets 276  150 - 400 (K/uL)  DIFFERENTIAL      Component Value Range   Neutrophils Relative 55  43 - 77 (%)   Neutro Abs 4.1  1.7 - 7.7 (K/uL)     Lymphocytes Relative 36  12 - 46 (%)   Lymphs Abs 2.7  0.7 - 4.0 (K/uL)   Monocytes Relative 7  3 - 12 (%)   Monocytes Absolute 0.5  0.1 - 1.0 (K/uL)   Eosinophils Relative 2  0 - 5 (%)   Eosinophils Absolute 0.2  0.0 - 0.7 (K/uL)   Basophils Relative 0  0 - 1 (%)   Basophils Absolute 0.0  0.0 - 0.1 (K/uL)  BASIC METABOLIC PANEL      Component Value Range   Sodium 142  135 - 145 (mEq/L)  Potassium 3.4 (*) 3.5 - 5.1 (mEq/L)   Chloride 105  96 - 112 (mEq/L)   CO2 28  19 - 32 (mEq/L)   Glucose, Bld 137 (*) 70 - 99 (mg/dL)   BUN 13  6 - 23 (mg/dL)   Creatinine, Ser 1.61  0.50 - 1.10 (mg/dL)   Calcium 9.3  8.4 - 09.6 (mg/dL)   GFR calc non Af Amer >60  >60 (mL/min)   GFR calc Af Amer >60  >60 (mL/min)  PROTIME-INR      Component Value Range   Prothrombin Time 23.9 (*) 11.6 - 15.2 (seconds)   INR 2.10 (*) 0.00 - 1.49   APTT      Component Value Range   aPTT 45 (*) 24 - 37 (seconds)  POCT I-STAT TROPONIN I      Component Value Range   Troponin i, poc 0.01  0.00 - 0.08 (ng/mL)   Comment 3            Dg Chest Portable 1 View  01/13/2011  *RADIOLOGY REPORT*  Clinical Data: Palpitations  PORTABLE CHEST - 1 VIEW  Comparison: Portable exam 1215 hours compared to 08/02/2010  Findings: Enlargement of cardiac silhouette. Mediastinal contours and pulmonary vascularity stable for technique. Minimal atelectasis left costophrenic angle. Lungs otherwise clear. No pleural effusion or pneumothorax. Bones appear demineralized.  IMPRESSION: Enlargement of cardiac silhouette. Minimal left base atelectasis.  Original Report Authenticated By: Lollie Marrow, M.D.   PROCEDURES:  ED COURSE / COORDINATION OF CARE: Orders Placed This Encounter  Procedures  . DG Chest Portable 1 View  . CBC  . Differential  . Basic metabolic panel  . Protime-INR  . APTT  . Cardiac monitoring  . Oxygen therapy Liters Per Minute: 2; Mode or (Route): Nasal cannula  . POCT i-Stat troponin I   11:56 AM-Patient vital  signs noted and evaluated in room. Heart Rate-107 , tachycardic. Blood Pressure-118/78 mmHg, normal. Oxygen saturation 97% on nasal canula-2L, normal. 1:17PM- Patient reports palpitations have improved following administration of Cardizem. Patient's heart rate regular.  2:20PM- Patient reports she is feeling much better at this time. Informed of intent to d/c home with close follow up with cardiologist tomorrow. Patient agrees with plan set forth at this time. Upon re-evaluation, patient with irregular rhythm but rate normal. Heart Rate- 95, normal. O2 Sats- 93% on nasal canula-2L, adequate, BP 133/74, normal.   MDM: Atrial fib,  Pt has hx of atrial fib.  Rapid response this time   PLAN: Discharge The patient is to return the emergency department if there is any worsening of symptoms. I have reviewed the discharge instructions with the patient/family  CONDITION ON DISCHARGE: Improved. Good.    MEDICATIONS GIVEN IN THE E.D.  Medications  diltiazem (CARDIZEM) 60 MG tablet (not administered)  warfarin (COUMADIN) 5 MG tablet (not administered)  metoprolol succinate (TOPROL-XL) 25 MG 24 hr tablet (not administered)  omeprazole (PRILOSEC) 20 MG capsule (not administered)  ALPRAZolam (XANAX) 0.5 MG tablet (not administered)  acetaminophen (TYLENOL) 325 MG tablet (not administered)  B Complex-C (B-COMPLEX WITH VITAMIN C) tablet (not administered)  vitamin B-12 (CYANOCOBALAMIN) 100 MCG tablet (not administered)  diltiazem (CARDIZEM) injection 10 mg (10 mg Intravenous Given 01/13/11 1216)       The chart was scribed for me under my direct supervision.  I personally performed the history, physical, and medical decision making and all procedures in the evaluation of this patient.Benny Lennert, MD 01/13/11 [phone]

## 2011-01-13 NOTE — ED Notes (Signed)
Cardizem 10mg  bolus given-Cardiac monitor shows atrial fib on monitor with rate in 80s.  BP 123/79 post administration.

## 2011-01-13 NOTE — ED Notes (Signed)
Pt c/o rapid heart beat today with sob and sweating. Denies cp. nad noted.

## 2011-03-04 ENCOUNTER — Ambulatory Visit: Payer: Medicare Other | Admitting: Urology

## 2011-04-01 ENCOUNTER — Other Ambulatory Visit: Payer: Self-pay

## 2011-04-01 ENCOUNTER — Emergency Department (HOSPITAL_COMMUNITY): Payer: Medicare Other

## 2011-04-01 ENCOUNTER — Encounter (HOSPITAL_COMMUNITY): Payer: Self-pay | Admitting: *Deleted

## 2011-04-01 ENCOUNTER — Emergency Department (HOSPITAL_COMMUNITY)
Admission: EM | Admit: 2011-04-01 | Discharge: 2011-04-01 | Disposition: A | Discharge status: Home / Self Care | Payer: Medicare Other | Attending: Emergency Medicine | Admitting: Emergency Medicine

## 2011-04-01 DIAGNOSIS — R5383 Other fatigue: Secondary | ICD-10-CM | POA: Insufficient documentation

## 2011-04-01 DIAGNOSIS — R5381 Other malaise: Secondary | ICD-10-CM | POA: Insufficient documentation

## 2011-04-01 DIAGNOSIS — I4891 Unspecified atrial fibrillation: Secondary | ICD-10-CM

## 2011-04-01 DIAGNOSIS — R000 Tachycardia, unspecified: Secondary | ICD-10-CM | POA: Insufficient documentation

## 2011-04-01 DIAGNOSIS — H539 Unspecified visual disturbance: Secondary | ICD-10-CM | POA: Insufficient documentation

## 2011-04-01 DIAGNOSIS — Z853 Personal history of malignant neoplasm of breast: Secondary | ICD-10-CM | POA: Insufficient documentation

## 2011-04-01 DIAGNOSIS — I4892 Unspecified atrial flutter: Secondary | ICD-10-CM | POA: Insufficient documentation

## 2011-04-01 DIAGNOSIS — I10 Essential (primary) hypertension: Secondary | ICD-10-CM | POA: Insufficient documentation

## 2011-04-01 DIAGNOSIS — I454 Nonspecific intraventricular block: Secondary | ICD-10-CM | POA: Insufficient documentation

## 2011-04-01 DIAGNOSIS — R110 Nausea: Secondary | ICD-10-CM | POA: Insufficient documentation

## 2011-04-01 DIAGNOSIS — R61 Generalized hyperhidrosis: Secondary | ICD-10-CM | POA: Insufficient documentation

## 2011-04-01 DIAGNOSIS — M549 Dorsalgia, unspecified: Secondary | ICD-10-CM | POA: Insufficient documentation

## 2011-04-01 DIAGNOSIS — R42 Dizziness and giddiness: Secondary | ICD-10-CM | POA: Insufficient documentation

## 2011-04-01 DIAGNOSIS — Z7901 Long term (current) use of anticoagulants: Secondary | ICD-10-CM | POA: Insufficient documentation

## 2011-04-01 HISTORY — DX: Unspecified atrial fibrillation: I48.91

## 2011-04-01 LAB — DIFFERENTIAL
Basophils Absolute: 0 10*3/uL (ref 0.0–0.1)
Lymphocytes Relative: 24 % (ref 12–46)
Monocytes Absolute: 0.6 10*3/uL (ref 0.1–1.0)
Neutro Abs: 5.1 10*3/uL (ref 1.7–7.7)
Neutrophils Relative %: 66 % (ref 43–77)

## 2011-04-01 LAB — CBC
HCT: 42.6 % (ref 36.0–46.0)
RDW: 12.6 % (ref 11.5–15.5)
WBC: 7.6 10*3/uL (ref 4.0–10.5)

## 2011-04-01 LAB — BASIC METABOLIC PANEL
CO2: 26 mEq/L (ref 19–32)
Chloride: 104 mEq/L (ref 96–112)
Creatinine, Ser: 0.71 mg/dL (ref 0.50–1.10)
GFR calc Af Amer: 89 mL/min — ABNORMAL LOW (ref >90)
Potassium: 3.6 mEq/L (ref 3.5–5.1)
Sodium: 140 mEq/L (ref 135–145)

## 2011-04-01 LAB — PROTIME-INR
INR: 1.44 (ref 0.00–1.49)
Prothrombin Time: 17.8 seconds — ABNORMAL HIGH (ref 11.6–15.2)

## 2011-04-01 LAB — POCT I-STAT TROPONIN I: Troponin i, poc: 0 ng/mL (ref 0.00–0.08)

## 2011-04-01 LAB — APTT: aPTT: 33 seconds (ref 24–37)

## 2011-04-01 MED ORDER — DILTIAZEM HCL 25 MG/5ML IV SOLN
20.0000 mg | Freq: Once | INTRAVENOUS | Status: AC
  Administered 2011-04-01: 20 mg via INTRAVENOUS
  Filled 2011-04-01: qty 5

## 2011-04-01 MED ORDER — SODIUM CHLORIDE 0.9 % IV BOLUS (SEPSIS)
500.0000 mL | Freq: Once | INTRAVENOUS | Status: AC
  Administered 2011-04-01: 10:00:00 via INTRAVENOUS

## 2011-04-01 NOTE — ED Provider Notes (Signed)
History  Scribed for [employer]. Colon Branch, MD, the patient was seen in room APA18. This chart was scribed by Hillery Hunter.   CSN: [id number] Arrival date & time: 04/01/2011  8:46 AM   First MD Initiated Contact with Patient 04/01/11 [phone]      Chief Complaint  Patient presents with  . Dizziness    The history is provided by the patient.    [name] ROUTE is a 77 y.o. female who presents to the Emergency Department complaining of sudden onset dizziness this morning about 20 minutes after waking up. She describes preparing coffee in the kitchen when she became suddenly dizzy with dim vision and back pain between both shoulder blades. She describes difficulty concentrating, sweatiness and brief nausea, and had to rest in her recliner for about one hour before she felt well enough to call for an ambulance. She denies taking any medications at home. She reports first being diagnosed with afib about one year ago. She is on Cardizem, Metoprolol, and Coumadin at home.  PCP is Dr. Gerda Diss  Past Medical History  Diagnosis Date  . Hypertension   . Atrial fibrillation   . Cancer     right breast    Past Surgical History  Procedure Date  . Breast surgery   . Appendectomy   . Hernia repair   . Colon surgery     No family history on file.  History  Substance Use Topics  . Smoking status: Never Smoker   . Smokeless tobacco: Not on file  . Alcohol Use: No    OB History    Grav Para Term Preterm Abortions TAB SAB Ect Mult Living   2 1   1  1   1       Review of Systems  Constitutional: Positive for diaphoresis.  HENT: Negative for congestion.   Eyes: Positive for visual disturbance (resolved dim vision).  Respiratory: Negative for shortness of breath.   Cardiovascular: Negative for chest pain and leg swelling.  Gastrointestinal: Positive for nausea (briefly today with dizziness).  Musculoskeletal: Positive for back pain (between shoulder blades).  Skin: Negative for  pallor.  Neurological: Positive for light-headedness.  Psychiatric/Behavioral: Positive for decreased concentration (with dizziness, now resolved).  All other systems reviewed and are negative.    Allergies  Cephalosporins; Morphine and related; Penicillins; and Sulfa antibiotics  Home Medications   Current Outpatient Rx  Name Route Sig Dispense Refill  . ACETAMINOPHEN 325 MG PO TABS Oral Take 650 mg by mouth every 6 (six) hours as needed. For pain    . ALPRAZOLAM 0.5 MG PO TABS Oral Take 0.5 mg by mouth at bedtime.      Marland Kitchen DILTIAZEM HCL 60 MG PO TABS Oral Take 60 mg by mouth daily.      Marland Kitchen METOPROLOL SUCCINATE 25 MG PO TB24 Oral Take 25 mg by mouth daily.      Marland Kitchen OMEPRAZOLE 20 MG PO CPDR Oral Take 20 mg by mouth every 3 (three) days.      Marland Kitchen VITAMIN B-12 100 MCG PO TABS Oral Take 100 mcg by mouth daily.      . WARFARIN SODIUM 5 MG PO TABS Oral Take 5-7.5 mg by mouth daily. Patient stated her doctor told her to take 7.5 mg yesterday when she got home and then take 7.5 mg today at 400 pm Then she is supposed to take 5 mg every day until next Thursday and she sees Dr. Salena Saner" at [facility]  Triage Vitals: BP 109/91  Pulse 132  Temp(Src) 99 F (37.2 C) (Oral)  Resp 16  Ht 5\' 4"  (1.626 m)  Wt 158 lb (71.668 kg)  BMI 27.12 kg/m2  SpO2 93%  Physical Exam  Nursing note and vitals reviewed. Constitutional: She is oriented to person, place, and time. She appears well-developed and well-nourished. No distress.  HENT:  Head: Normocephalic and atraumatic.  Mouth/Throat: Oropharynx is clear and moist.  Eyes: EOM are normal. Pupils are equal, round, and reactive to light.  Neck: Neck supple. No thyromegaly present.  Cardiovascular: Exam reveals no gallop and no friction rub.   No murmur heard.      irregularly irregular with rapid rate  Pulmonary/Chest: Effort normal. No respiratory distress. She has no wheezes. She has no rales.  Abdominal: Soft. She exhibits no distension. There is no  tenderness. There is no rebound.  Musculoskeletal: She exhibits no edema and no tenderness.  Lymphadenopathy:    She has no cervical adenopathy.  Neurological: She is alert and oriented to person, place, and time. No cranial nerve deficit.  Skin: Skin is warm and dry. No pallor.  Psychiatric: She has a normal mood and affect. Her behavior is normal. Judgment and thought content normal.    ED Course  Procedures    Labs Reviewed  CBC  DIFFERENTIAL  BASIC METABOLIC PANEL  I-STAT TROPONIN I  APTT  PROTIME-INR   OTHER DATA REVIEWED: Nursing notes, vital signs, and past medical records reviewed.  DIAGNOSTIC STUDIES: Oxygen Saturation is 93% on room air, low by my interpretation.   09:30. Pulse ox improved to 96% on 2L nasal cannula.  08:56: EKG interpreted by EDMD: Rate 156 Atrial flutter, 2:1 conduction block. Non-specific ST/T wave abnormalities in inferior and lateral leads. Compared with 01/13/2011 she is now in flutter instead of afib.  ED COURSE / COORDINATION OF CARE: 09:16. Ordered: EKG, CBC ; Differential ; Basic metabolic panel ; i-Stat troponin I, APTT ; Protime-INR ; Saline lock IV ; Cardiac monitoring ; DG Chest Portable 1 View.  09:32. Ordered: CBC ; diltiazem (CARDIZEM) injection 20 mg ; sodium chloride 0.9 % bolus 500 mL  10:43 EKG interpreted by EDMD: Rate 65 Sinus rhythm with 1st degree block and fusion complexes.  11:46. Patient feels improved.  MDM  Patient with h/o atrial fibrillation here with rapid ventricular response resulting in weakness and dizziness. Patient administered single dose of cardizem 20 mg IV with conversion to NSR with 1st degree block. Patient feels better. Repeat INR which was 1.3 yesterday is 1.44. Patient is currently on adjusted dose coumadin. She will follow up with [facility].Pt feels improved after observation and/or treatment in ED.Pt stable in ED with no significant deterioration in condition.The patient appears  reasonably screened and/or stabilized for discharge and I doubt any other medical condition or other [facility] requiring further screening, evaluation, or treatment in the ED at this time prior to discharge. MDM Reviewed: nursing note, previous chart and vitals Reviewed previous: ECG and labs Interpretation: labs, x-ray and ECG Total time providing critical care: 35.  I personally performed the services described in this documentation, which was scribed in my presence. The recorded information has been reviewed and considered.      Nicoletta Dress. Colon Branch, MD 04/01/11 1236

## 2011-04-01 NOTE — ED Notes (Signed)
Pt states severe dizziness along with upper back pain, nausea, and "sweating a little" began at 0600 this morning. Pt denies pain at present but states dizziness remains.

## 2011-04-01 NOTE — ED Notes (Signed)
Pt eating lunch. Waiting for transportation home

## 2011-04-01 NOTE — ED Notes (Signed)
Pt has a hx of afib. Complain of dizziness and weakness today. Denies chest pain

## 2011-04-01 NOTE — ED Notes (Signed)
Pt states her head feels a little funny past heart rate decreased

## 2011-04-01 NOTE — ED Notes (Signed)
Pt states she is getting board. Offered for pt to watch tv while she is waiting to be reeval

## 2011-04-25 ENCOUNTER — Ambulatory Visit (HOSPITAL_COMMUNITY)
Admission: RE | Admit: 2011-04-25 | Discharge: 2011-04-25 | Disposition: A | Discharge status: Home / Self Care | Payer: Medicare Other | Source: Ambulatory Visit | Attending: Family Medicine | Admitting: Family Medicine

## 2011-04-25 ENCOUNTER — Other Ambulatory Visit: Payer: Self-pay | Admitting: Family Medicine

## 2011-04-25 DIAGNOSIS — M25561 Pain in right knee: Secondary | ICD-10-CM

## 2011-04-25 DIAGNOSIS — M949 Disorder of cartilage, unspecified: Secondary | ICD-10-CM | POA: Insufficient documentation

## 2011-04-25 DIAGNOSIS — M899 Disorder of bone, unspecified: Secondary | ICD-10-CM | POA: Insufficient documentation

## 2011-04-25 DIAGNOSIS — M25569 Pain in unspecified knee: Secondary | ICD-10-CM | POA: Insufficient documentation

## 2011-05-10 ENCOUNTER — Encounter: Payer: Self-pay | Admitting: Orthopedic Surgery

## 2011-05-10 ENCOUNTER — Ambulatory Visit (INDEPENDENT_AMBULATORY_CARE_PROVIDER_SITE_OTHER): Payer: Medicare Other | Admitting: Orthopedic Surgery

## 2011-05-10 VITALS — BP 124/70 | Ht 64.0 in | Wt 158.0 lb

## 2011-05-10 DIAGNOSIS — M5430 Sciatica, unspecified side: Secondary | ICD-10-CM

## 2011-05-10 DIAGNOSIS — M5431 Sciatica, right side: Secondary | ICD-10-CM

## 2011-05-10 MED ORDER — GABAPENTIN 100 MG PO CAPS: 100.0000 mg | ORAL_CAPSULE | Freq: Three times a day (TID) | ORAL | Status: DC

## 2011-05-10 NOTE — Patient Instructions (Addendum)
Start gabapentin 100 mg 3 x a day TAKE UNTIL PAIN SUBSIDES   DIAGNOSIS: SCIATICA   FOLLOW UP WITH YOUR DOCTOR   Sciatica Sciatica is a weakness and/or changes in sensation (tingling, jolts, hot and cold, numbness) along the path the sciatic nerve travels. Irritation or damage to lumbar nerve roots is often also referred to as lumbar radiculopathy.   Lumbar radiculopathy (Sciatica) is the most common form of this problem. Radiculopathy can occur in any of the nerves coming out of the spinal cord. The problems caused depend on which nerves are involved. The sciatic nerve is the large nerve supplying the branches of nerves going from the hip to the toes. It often causes a numbness or weakness in the skin and/or muscles that the sciatic nerve serves. It also may cause symptoms (problems) of pain, burning, tingling, or electric shock-like feelings in the path of this nerve. This usually comes from injury to the fibers that make up the sciatic nerve. Some of these symptoms are low back pain and/or unpleasant feelings in the following areas:  From the mid-buttock down the back of the leg to the back of the knee.     And/or the outside of the calf and top of the foot.     And/or behind the inner ankle to the sole of the foot.  CAUSES    Herniated or slipped disc. Discs are the little cushions between the bones in the back.     Pressure by the piriformis muscle in the buttock on the sciatic nerve (Piriformis Syndrome).     Misalignment of the bones in the lower back and buttocks (Sacroiliac Joint Derangement).     Narrowing of the spinal canal that puts pressure on or pinches the fibers that make up the sciatic nerve.     A slipped vertebra that is out of line with those above or beneath it.     Abnormality of the nervous system itself so that nerve fibers do not transmit signals properly, especially to feet and calves (neuropathy).     Tumor (this is rare).  Your caregiver can usually determine  the cause of your sciatica and begin the treatment most likely to help you. TREATMENT   Taking over-the-counter painkillers, physical therapy, rest, exercise, spinal manipulation, and injections of anesthetics and/or steroids may be used. Surgery, acupuncture, and Yoga can also be effective. Mind over matter techniques, mental imagery, and changing factors such as your bed, chair, desk height, posture, and activities are other treatments that may be helpful. You and your caregiver can help determine what is best for you. With proper diagnosis, the cause of most sciatica can be identified and removed. Communication and cooperation between your caregiver and you is essential. If you are not successful immediately, do not be discouraged. With time, a proper treatment can be found that will make you comfortable. HOME CARE INSTRUCTIONS    If the pain is coming from a problem in the back, applying ice to that area for 15 to 20 minutes, 3 to 4 times per day while awake, may be helpful. Put the ice in a plastic bag. Place a towel between the bag of ice and your skin.     You may exercise or perform your usual activities if these do not aggravate your pain, or as suggested by your caregiver.     Only take over-the-counter or prescription medicines for pain, discomfort, or fever as directed by your caregiver.     If your caregiver  has given you a follow-up appointment, it is very important to keep that appointment. Not keeping the appointment could result in a chronic or permanent injury, pain, and disability. If there is any problem keeping the appointment, you must call back to this facility for assistance.  SEEK IMMEDIATE MEDICAL CARE IF:    You experience loss of control of bowel or bladder.     You have increasing weakness in the trunk, buttocks, or legs.     There is numbness in any areas from the hip down to the toes.     You have difficulty walking or keeping your balance.     You have any of the  above, with fever or forceful vomiting.  Document Released: 05/03/2001 Document Revised: 01/19/2011 Document Reviewed: 12/21/2007 [facility] Patient Information 2012 [place], [place].Sciatica Sciatica is a weakness and/or changes in sensation (tingling, jolts, hot and cold, numbness) along the path the sciatic nerve travels. Irritation or damage to lumbar nerve roots is often also referred to as lumbar radiculopathy.   Lumbar radiculopathy (Sciatica) is the most common form of this problem. Radiculopathy can occur in any of the nerves coming out of the spinal cord. The problems caused depend on which nerves are involved. The sciatic nerve is the large nerve supplying the branches of nerves going from the hip to the toes. It often causes a numbness or weakness in the skin and/or muscles that the sciatic nerve serves. It also may cause symptoms (problems) of pain, burning, tingling, or electric shock-like feelings in the path of this nerve. This usually comes from injury to the fibers that make up the sciatic nerve. Some of these symptoms are low back pain and/or unpleasant feelings in the following areas:  From the mid-buttock down the back of the leg to the back of the knee.     And/or the outside of the calf and top of the foot.     And/or behind the inner ankle to the sole of the foot.  CAUSES    Herniated or slipped disc. Discs are the little cushions between the bones in the back.     Pressure by the piriformis muscle in the buttock on the sciatic nerve (Piriformis Syndrome).     Misalignment of the bones in the lower back and buttocks (Sacroiliac Joint Derangement).     Narrowing of the spinal canal that puts pressure on or pinches the fibers that make up the sciatic nerve.     A slipped vertebra that is out of line with those above or beneath it.     Abnormality of the nervous system itself so that nerve fibers do not transmit signals properly, especially to feet and calves (neuropathy).       Tumor (this is rare).  Your caregiver can usually determine the cause of your sciatica and begin the treatment most likely to help you. TREATMENT   Taking over-the-counter painkillers, physical therapy, rest, exercise, spinal manipulation, and injections of anesthetics and/or steroids may be used. Surgery, acupuncture, and Yoga can also be effective. Mind over matter techniques, mental imagery, and changing factors such as your bed, chair, desk height, posture, and activities are other treatments that may be helpful. You and your caregiver can help determine what is best for you. With proper diagnosis, the cause of most sciatica can be identified and removed. Communication and cooperation between your caregiver and you is essential. If you are not successful immediately, do not be discouraged. With time, a proper treatment can be  found that will make you comfortable. HOME CARE INSTRUCTIONS    If the pain is coming from a problem in the back, applying ice to that area for 15 to 20 minutes, 3 to 4 times per day while awake, may be helpful. Put the ice in a plastic bag. Place a towel between the bag of ice and your skin.     You may exercise or perform your usual activities if these do not aggravate your pain, or as suggested by your caregiver.     Only take over-the-counter or prescription medicines for pain, discomfort, or fever as directed by your caregiver.     If your caregiver has given you a follow-up appointment, it is very important to keep that appointment. Not keeping the appointment could result in a chronic or permanent injury, pain, and disability. If there is any problem keeping the appointment, you must call back to this facility for assistance.  SEEK IMMEDIATE MEDICAL CARE IF:    You experience loss of control of bowel or bladder.     You have increasing weakness in the trunk, buttocks, or legs.     There is numbness in any areas from the hip down to the toes.     You have  difficulty walking or keeping your balance.     You have any of the above, with fever or forceful vomiting.  Document Released: 05/03/2001 Document Revised: 01/19/2011 Document Reviewed: 12/21/2007 [facility] Patient Information 2012 [place], [place].

## 2011-05-23 ENCOUNTER — Encounter: Payer: Self-pay | Admitting: Orthopedic Surgery

## 2011-05-23 NOTE — Progress Notes (Signed)
Patient ID: [name], female   DOB: 1923/10/21, 77 y.o.   MRN: [id number] Chief complaint: Knee pain HPI:(16) This 77 year old patient is referred to me by Dr. Lilyan Punt  for pain in the RIGHT knee which began about 6 weeks ago.  The pain came on gradually and radiates down the RIGHT leg.  The patient has taken some hydrocodone 5 mg but continues to complain of throbbing 9/10 constant pain better with rest worse with standing or walking.   ROS:(2) The patient denies weight loss fever or chills.  She denies urinary symptoms were saddle anesthesia.  She denies loss of bowel control.  She does complain of nervousness anxiety and easy bruising.  PFSH: (1)  Past Medical History  Diagnosis Date  . Hypertension   . [place]-induced atrial fibrillation   . Cancer     right breast     Physical Exam(12) GENERAL: normal development   CDV: pulses are normal   Skin: normal  Lymph: nodes were not palpable/normal  Psychiatric: awake, alert and oriented  Neuro: normal sensation  MSK Ambulation is Abnormal. 1 RIGHT knee exam is normal.  There is no effusion, no atrophy, strength is normal range of motion is normal. 2 Lumbar spine is tender with loss of motion increase muscle tension. 3 Straight leg raise is positive at 45 on the RIGHT leg 4 Negative straight leg raise on the LEFT leg 5 RIGHT hip full range of motion normal strength and stability 6 LEFT hip full range of motion normal strength and stability  Imaging: Knee films are negative.  Lumbar films show degenerative disc disease  Assessment: Back pain with sciatica    Plan: Recommend medical management

## 2011-09-29 ENCOUNTER — Emergency Department (HOSPITAL_COMMUNITY): Payer: Medicare Other

## 2011-09-29 ENCOUNTER — Encounter (HOSPITAL_COMMUNITY): Payer: Self-pay | Admitting: Emergency Medicine

## 2011-09-29 ENCOUNTER — Emergency Department (HOSPITAL_COMMUNITY)
Admission: EM | Admit: 2011-09-29 | Discharge: 2011-09-29 | Disposition: A | Discharge status: Home / Self Care | Payer: Medicare Other | Attending: Emergency Medicine | Admitting: Emergency Medicine

## 2011-09-29 DIAGNOSIS — R0602 Shortness of breath: Secondary | ICD-10-CM | POA: Insufficient documentation

## 2011-09-29 DIAGNOSIS — Z7901 Long term (current) use of anticoagulants: Secondary | ICD-10-CM | POA: Insufficient documentation

## 2011-09-29 DIAGNOSIS — I4891 Unspecified atrial fibrillation: Secondary | ICD-10-CM

## 2011-09-29 DIAGNOSIS — Z853 Personal history of malignant neoplasm of breast: Secondary | ICD-10-CM | POA: Insufficient documentation

## 2011-09-29 DIAGNOSIS — I10 Essential (primary) hypertension: Secondary | ICD-10-CM | POA: Insufficient documentation

## 2011-09-29 DIAGNOSIS — R000 Tachycardia, unspecified: Secondary | ICD-10-CM | POA: Insufficient documentation

## 2011-09-29 DIAGNOSIS — R002 Palpitations: Secondary | ICD-10-CM | POA: Insufficient documentation

## 2011-09-29 LAB — DIFFERENTIAL
Eosinophils Relative: 1 % (ref 0–5)
Lymphocytes Relative: 29 % (ref 12–46)
Lymphs Abs: 2.9 10*3/uL (ref 0.7–4.0)
Monocytes Absolute: 0.8 10*3/uL (ref 0.1–1.0)
Neutro Abs: 6.2 10*3/uL (ref 1.7–7.7)

## 2011-09-29 LAB — CBC
HCT: 42.2 % (ref 36.0–46.0)
Hemoglobin: 14.2 g/dL (ref 12.0–15.0)
MCV: 85.6 fL (ref 78.0–100.0)
RBC: 4.93 MIL/uL (ref 3.87–5.11)
WBC: 9.9 10*3/uL (ref 4.0–10.5)

## 2011-09-29 LAB — BASIC METABOLIC PANEL
BUN: 13 mg/dL (ref 6–23)
Calcium: 10.1 mg/dL (ref 8.4–10.5)
Creatinine, Ser: 0.58 mg/dL (ref 0.50–1.10)
GFR calc Af Amer: >90 mL/min (ref >90)
GFR calc non Af Amer: 82 mL/min — ABNORMAL LOW (ref >90)

## 2011-09-29 MED ORDER — POTASSIUM CHLORIDE CRYS ER 20 MEQ PO TBCR
40.0000 meq | EXTENDED_RELEASE_TABLET | Freq: Once | ORAL | Status: AC
  Administered 2011-09-29: 40 meq via ORAL
  Filled 2011-09-29: qty 2

## 2011-09-29 MED ORDER — SODIUM CHLORIDE 0.9 % IV BOLUS (SEPSIS)
250.0000 mL | Freq: Once | INTRAVENOUS | Status: AC
  Administered 2011-09-29: 19:00:00 via INTRAVENOUS

## 2011-09-29 MED ORDER — SODIUM CHLORIDE 0.9 % IV SOLN
INTRAVENOUS | Status: DC
  Administered 2011-09-29: 19:00:00 via INTRAVENOUS

## 2011-09-29 MED ORDER — DILTIAZEM HCL 25 MG/5ML IV SOLN
10.0000 mg | Freq: Once | INTRAVENOUS | Status: AC
  Administered 2011-09-29: 10 mg via INTRAVENOUS
  Filled 2011-09-29: qty 5

## 2011-09-29 NOTE — ED Provider Notes (Signed)
History   This chart was scribed for Shelda Jakes, MD scribed by Magnus Sinning. The patient was seen in room APA01/APA01 seen at 18:44.   CSN: [id number]  Arrival date & time 09/29/11  1816   First MD Initiated Contact with Patient 09/29/11 1817      Chief Complaint  Patient presents with  . Tachycardia    (Consider location/radiation/quality/duration/timing/severity/associated sxs/prior treatment) HPI [name] is a 78 y.o. female who presents to the Emergency Department complaining of constant moderate tachycardia, onset 6:00 PM this evening. Says was feeling fine when she got up this morning, but she did not take her BP medication and then around 6:00 PM this evening she began having similar feelings to a-fib. Currently taking Coumadin,Diltiazem, Cardizem, Toprol. Denies CP,  ,syncope, or current SOB.Hx of a-fib for 3 years. Says recently, most of the time her heart rate at baseline is between 69-75. PCP: Dr. Teola Bradley Cardiologist: Dr. Salena Saner" [facility] Past Medical History  Diagnosis Date  . Hypertension   . Atrial fibrillation   . Cancer     right breast    Past Surgical History  Procedure Date  . Breast surgery   . Appendectomy   . Hernia repair   . Colon surgery     Family History  Problem Relation Age of Onset  . Heart disease    . Arthritis    . Cancer      History  Substance Use Topics  . Smoking status: Never Smoker   . Smokeless tobacco: Not on file  . Alcohol Use: No    OB History    Grav Para Term Preterm Abortions TAB SAB Ect Mult Living   2 1   1  1   1       Review of Systems  Constitutional: Negative for fever and chills.  HENT: Negative for congestion, sore throat and neck pain.   Respiratory: Positive for shortness of breath (When walking to the mail box.). Negative for cough.   Cardiovascular: Positive for palpitations. Negative for chest pain.  Gastrointestinal: Negative for nausea, vomiting, abdominal  pain and diarrhea.  Genitourinary: Negative for dysuria.  Musculoskeletal: Negative for back pain.  Skin: Negative for rash.       Denies problems in bleeding easily, despite being on Coumadin.  Neurological: Negative for syncope.  All other systems reviewed and are negative.    Allergies  Cephalosporins; Hydrocodone; Morphine and related; Penicillins; and Sulfa antibiotics  Home Medications   Current Outpatient Rx  Name Route Sig Dispense Refill  . ALPRAZOLAM 0.5 MG PO TABS Oral Take 0.5 mg by mouth at bedtime.      Marland Kitchen DILTIAZEM HCL 60 MG PO TABS Oral Take 60 mg by mouth daily.      Marland Kitchen HYDROCODONE-ACETAMINOPHEN 5-325 MG PO TABS Oral Take 0.5 tablets by mouth every 6 (six) hours as needed. For pain    . METOPROLOL SUCCINATE ER 25 MG PO TB24 Oral Take 25 mg by mouth daily.      Marland Kitchen OMEPRAZOLE 20 MG PO CPDR Oral Take 20 mg by mouth every 3 (three) days.      . WARFARIN SODIUM 5 MG PO TABS Oral Take 2.5-5 mg by mouth See admin instructions. Take one-half tablet (2.5mg ) by mouth on Mondays and Thursdays ONLY, then take one tablet (5mg )on all other days. **Take at 4:00 (1600)pm daily**    . ACETAMINOPHEN 325 MG PO TABS Oral Take 650 mg by mouth every 6 (six)  hours as needed. For pain      BP 173/95  Pulse 121  Temp(Src) 98.3 F (36.8 C) (Oral)  Resp 17  Ht 5\' 4"  (1.626 m)  Wt 158 lb (71.668 kg)  BMI 27.12 kg/m2  SpO2 97%  Physical Exam  Nursing note and vitals reviewed. Constitutional: She is oriented to person, place, and time. She appears well-developed and well-nourished. No distress.  HENT:  Head: Normocephalic and atraumatic.  Mouth/Throat: Oropharynx is clear and moist.  Eyes: EOM are normal. Pupils are equal, round, and reactive to light.  Neck: Neck supple. No tracheal deviation present.  Cardiovascular: Normal rate.  An irregular rhythm present.  No murmur heard. Pulmonary/Chest: Effort normal. No respiratory distress. She has no wheezes. She has no rales.  Abdominal:  Soft. Bowel sounds are normal. She exhibits no distension. There is no tenderness.  Musculoskeletal: Normal range of motion. She exhibits no edema.  Neurological: She is alert and oriented to person, place, and time. No cranial nerve deficit or sensory deficit. She exhibits normal muscle tone. Coordination normal.  Skin: Skin is warm and dry.  Psychiatric: She has a normal mood and affect. Her behavior is normal.    ED Course  Procedures (including critical care time) DIAGNOSTIC STUDIES: Oxygen Saturation is 97% on room air, normal by my interpretation.    COORDINATION OF CARE: Medication Orders 1900: CARDIZEM injection 10 mg Onc           Sodium chloride 0.9 % bolus 250 mL Once   Results for orders placed during the hospital encounter of 09/29/11  BASIC METABOLIC PANEL      Component Value Range   Sodium 141  135 - 145 (mEq/L)   Potassium 3.3 (*) 3.5 - 5.1 (mEq/L)   Chloride 103  96 - 112 (mEq/L)   CO2 24  19 - 32 (mEq/L)   Glucose, Bld 126 (*) 70 - 99 (mg/dL)   BUN 13  6 - 23 (mg/dL)   Creatinine, Ser 9.60  0.50 - 1.10 (mg/dL)   Calcium 45.4  8.4 - 10.5 (mg/dL)   GFR calc non Af Amer 82 (*) >90 (mL/min)   GFR calc Af Amer >90  >90 (mL/min)  CBC      Component Value Range   WBC 9.9  4.0 - 10.5 (K/uL)   RBC 4.93  3.87 - 5.11 (MIL/uL)   Hemoglobin 14.2  12.0 - 15.0 (g/dL)   HCT 09.8  11.9 - 14.7 (%)   MCV 85.6  78.0 - 100.0 (fL)   MCH 28.8  26.0 - 34.0 (pg)   MCHC 33.6  30.0 - 36.0 (g/dL)   RDW 82.9  56.2 - 13.0 (%)   Platelets 261  150 - 400 (K/uL)  DIFFERENTIAL      Component Value Range   Neutrophils Relative 62  43 - 77 (%)   Neutro Abs 6.2  1.7 - 7.7 (K/uL)   Lymphocytes Relative 29  12 - 46 (%)   Lymphs Abs 2.9  0.7 - 4.0 (K/uL)   Monocytes Relative 8  3 - 12 (%)   Monocytes Absolute 0.8  0.1 - 1.0 (K/uL)   Eosinophils Relative 1  0 - 5 (%)   Eosinophils Absolute 0.1  0.0 - 0.7 (K/uL)   Basophils Relative 0  0 - 1 (%)   Basophils Absolute 0.0  0.0 - 0.1 (K/uL)   PROTIME-INR      Component Value Range   Prothrombin Time 21.7 (*) 11.6 - 15.2 (seconds)  INR 1.85 (*) 0.00 - 1.49    Dg Chest Port 1 View  09/29/2011  *RADIOLOGY REPORT*  Clinical Data: Tachycardia  PORTABLE CHEST - 1 VIEW  Comparison: 04/01/2011  Findings: 1901 hours. The lungs are clear without focal infiltrate, edema, pneumothorax or pleural effusion. Cardiopericardial silhouette is at upper limits of normal for size. Imaged bony structures of the thorax are intact. Telemetry leads overlie the chest.  IMPRESSION: Stable.  Borderline cardiomegaly without acute cardiopulmonary findings.  Original Report Authenticated By: ERIC A. MANSELL, M.D.   Date: 09/29/2011  Rate: 115  Rhythm: atrial fibrillation  QRS Axis: normal  Intervals: normal  ST/T Wave abnormalities: nonspecific ST changes  Conduction Disutrbances:none  Narrative Interpretation:   Old EKG Reviewed: unchanged No significant changes on the EKG in comparison to 04/01/2011. Other than the rate of atrial fibrillation predominates but previous EKGs may represent natural flutter and atrial fib.   1. Atrial fibrillation with rapid ventricular response       MDM  Patient's blood pressure and heart rate improved with 10 mg of Cardizem IV. Heart rate for the most part now back in sinus rhythm occasional atrial fib heart rates in the 80s blood pressure improved. Patient without any chest pain chest x-rays negative labs I. sniffing abnormalities of the potassium being slightly low given 40 mg potassium in the emergency department. Patient's INR was on the low side at 1.88 something and we'll require adjustment need to follow primary care doctor in the next few days. Also needs to follow up with [facility] heart and vascular cardiology group next week. Patient will return for any recurrent symptoms or fast heart rate shortness of breath chest pain or feeling like she's got pass out.  Suspect patient may have forgotten to take her beta  blocker and her calcium channel blocker.  Chest x-ray is negative for pneumonia or pneumothorax EKG consistent with a heart rate upon arrival of atrial fib 1:15 other significant changes metastatic in change compared to EKG from November of 2012.   I personally performed the services described in this documentation, which was scribed in my presence. The recorded information has been reviewed and considered.          Shelda Jakes, MD 09/29/11 2125

## 2011-09-29 NOTE — ED Notes (Signed)
Patient with c/o tachycardia. History of A-fib, reports this feels like prior episodes. Denies chest pain, denies shortness of breath.

## 2011-09-29 NOTE — Discharge Instructions (Signed)
Continue take your medications as directed. Call [facility] for followup next week. Follow up with your primary care doctor about sure Coumadin level it was below 2 they will need to check that in the next few days. Return for any new or worse symptoms return for her heart going fast chest pain or feeling like you pass out. Or for shortness of breath.

## 2011-10-10 ENCOUNTER — Encounter (HOSPITAL_COMMUNITY): Payer: Self-pay | Admitting: *Deleted

## 2011-10-10 ENCOUNTER — Emergency Department (HOSPITAL_COMMUNITY)
Admission: EM | Admit: 2011-10-10 | Discharge: 2011-10-10 | Disposition: A | Discharge status: Home / Self Care | Payer: Medicare Other | Attending: Emergency Medicine | Admitting: Emergency Medicine

## 2011-10-10 DIAGNOSIS — Z79899 Other long term (current) drug therapy: Secondary | ICD-10-CM | POA: Insufficient documentation

## 2011-10-10 DIAGNOSIS — I10 Essential (primary) hypertension: Secondary | ICD-10-CM | POA: Insufficient documentation

## 2011-10-10 DIAGNOSIS — I4891 Unspecified atrial fibrillation: Secondary | ICD-10-CM | POA: Insufficient documentation

## 2011-10-10 DIAGNOSIS — R110 Nausea: Secondary | ICD-10-CM | POA: Insufficient documentation

## 2011-10-10 DIAGNOSIS — Z853 Personal history of malignant neoplasm of breast: Secondary | ICD-10-CM | POA: Insufficient documentation

## 2011-10-10 DIAGNOSIS — Z9889 Other specified postprocedural states: Secondary | ICD-10-CM | POA: Insufficient documentation

## 2011-10-10 MED ORDER — ONDANSETRON 8 MG PO TBDP
8.0000 mg | ORAL_TABLET | Freq: Once | ORAL | Status: AC
  Administered 2011-10-10: 8 mg via ORAL
  Filled 2011-10-10: qty 1

## 2011-10-10 MED ORDER — ONDANSETRON HCL 4 MG PO TABS: 4.0000 mg | ORAL_TABLET | Freq: Three times a day (TID) | ORAL | Status: AC | PRN

## 2011-10-10 MED ORDER — ONDANSETRON 8 MG PO TBDP: 8.0000 mg | ORAL_TABLET | Freq: Once | ORAL | Status: DC

## 2011-10-10 NOTE — ED Notes (Signed)
Started taking doxycycline on 10/04/2011 for tick bite.  Reports has had nausea and dizziness since.  Also reports irregular heart rate since this morning.

## 2011-10-10 NOTE — Discharge Instructions (Signed)
B.R.A.T. Diet Your doctor has recommended the B.R.A.T. diet for you or your child until the condition improves. This is often used to help control diarrhea and vomiting symptoms. If you or your child can tolerate clear liquids, you may have:  Bananas.   Rice.   Applesauce.   Toast (and other simple starches such as crackers, potatoes, noodles).  Be sure to avoid dairy products, meats, and fatty foods until symptoms are better. Fruit juices such as apple, grape, and prune juice can make diarrhea worse. Avoid these. Continue this diet for 2 days or as instructed by your caregiver. Document Released: 05/09/2005 Document Revised: 04/28/2011 Document Reviewed: 10/26/2006 ExitCare Patient Information 2012 [employer].Clear Liquid Diet The clear liquid dietconsists of foods that are liquid or will become liquid at room temperature.You should be able to see through the liquid and beverages. Examples of foods allowed on a clear liquid diet include fruit juice, broth or bouillon, gelatin, or frozen ice pops. The purpose of this diet is to provide necessary fluid, electrolytes such as sodium and potassium, and energy to keep the body functioning during times when you are not able to consume a regular diet.A clear liquid diet should not be continued for long periods of time as it is not nutritionally adequate.  REASONS FOR USING A CLEAR LIQUID DIET  In sudden onset (acute) conditions for a patient before or after surgery.   As the first step in oral feeding.   For fluid and electrolyte replacement in diarrheal diseases.   As a diet before certain medical tests are performed.  ADEQUACY The clear liquid diet is adequate only in ascorbic acid, according to the Recommended Dietary Allowances of the National Research Council. CHOOSING FOODS Breads and Starches  Allowed:  None are allowed.   Avoid: All are avoided.  Vegetables  Allowed:  Strained tomato or vegetable juice.   Avoid: Any  others.  Fruit  Allowed:  Strained fruit juices and fruit drinks. Include 1 serving of citrus or vitamin C-enriched fruit juice daily.   Avoid: Any others.  Meat and Meat Substitutes  Allowed:  None are allowed.   Avoid: All are avoided.  Milk  Allowed:  None are allowed.   Avoid: All are avoided.  Soups and Combination Foods  Allowed:  Clear bouillon, broth, or strained broth-based soups.   Avoid: Any others.  Desserts and Sweets  Allowed:  Sugar, honey. High protein gelatin. Flavored gelatin, ices, or frozen ice pops that do not contain milk.   Avoid: Any others.  Fats and Oils  Allowed:  None are allowed.   Avoid: All are avoided.  Beverages  Allowed: Cereal beverages, coffee (regular or decaffeinated), tea, or soda at the discretion of your caregiver.   Avoid: Any others.  Condiments  Allowed:  Iodized salt.   Avoid: Any others, including pepper.  Supplements  Allowed:  Liquid nutrition beverages.   Avoid: Any others that contain lactose or fiber.  SAMPLE MEAL PLAN Breakfast  4 oz (120 mL) strained orange juice.    to 1 cup (125 to 250 mL) gelatin (plain or fortified).   1 cup (250 mL) beverage (coffee or tea).   Sugar, if desired.  Midmorning Snack   cup (125 mL) gelatin (plain or fortified).  Lunch  1 cup (250 mL) broth or consomm.   4 oz (120 mL) strained grapefruit juice.    cup (125 mL) gelatin (plain or fortified).   1 cup (250 mL) beverage (coffee or tea).     Sugar, if desired.  Midafternoon Snack   cup (125 mL) fruit ice.    cup (125 mL) strained fruit juice.  Dinner  1 cup (250 mL) broth or consomm.    cup (125 mL) cranberry juice.    cup (125 mL) flavored gelatin (plain or fortified).   1 cup (250 mL) beverage (coffee or tea).   Sugar, if desired.  Evening Snack  4 oz (120 mL) strained apple juice (vitamin C-fortified).    cup (125 mL) flavored gelatin (plain or fortified).  Document Released: 05/09/2005  Document Revised: 04/28/2011 Document Reviewed: 08/06/2010 ExitCare Patient Information 2012 [employer]. 

## 2011-10-10 NOTE — ED Provider Notes (Signed)
History   This chart was scribed for Flint Melter, MD by Clarita Crane. The patient was seen in room APA15/APA15. Patient's care was started at 0956.    CSN: [id number]  Arrival date & time 10/10/11  2952   First MD Initiated Contact with Patient 10/10/11 1316      Chief Complaint  Patient presents with  . Nausea    (Consider location/radiation/quality/duration/timing/severity/associated sxs/prior treatment) HPI [name] is a 78 y.o. female who presents to the Emergency Department complaining of constant moderate to severe nausea onset 6 days ago after starting doxycycline to treat a tick bite and persistent since. Patient reports that nausea improved with administration of Zofran in ED prior to initial exam. Denies diarrhea, fever, chills. Patient with h/o HTN, atrial fibrillation, CA, appendectomy.   Past Medical History  Diagnosis Date  . Hypertension   . Atrial fibrillation   . Cancer     right breast    Past Surgical History  Procedure Date  . Breast surgery   . Appendectomy   . Hernia repair   . Colon surgery     Family History  Problem Relation Age of Onset  . Heart disease    . Arthritis    . Cancer      History  Substance Use Topics  . Smoking status: Never Smoker   . Smokeless tobacco: Not on file  . Alcohol Use: No    OB History    Grav Para Term Preterm Abortions TAB SAB Ect Mult Living   2 1   1  1   1       Review of Systems A complete 10 system review of systems was obtained and all systems are negative except as noted in the HPI and PMH.   Allergies  Cephalosporins; Hydrocodone; Morphine and related; Penicillins; and Sulfa antibiotics  Home Medications   Current Outpatient Rx  Name Route Sig Dispense Refill  . ALPRAZOLAM 0.5 MG PO TABS Oral Take 0.5 mg by mouth at bedtime.      Marland Kitchen DILTIAZEM HCL 60 MG PO TABS Oral Take 60 mg by mouth daily.      Marland Kitchen DOXYCYCLINE HYCLATE 100 MG PO TABS Oral Take 100 mg by mouth 2 (two) times daily.     Marland Kitchen HYDROCODONE-ACETAMINOPHEN 5-325 MG PO TABS Oral Take 0.5 tablets by mouth 2 (two) times daily. For pain    . METOPROLOL SUCCINATE ER 25 MG PO TB24 Oral Take 25 mg by mouth daily.      . WARFARIN SODIUM 5 MG PO TABS Oral Take 2.5-5 mg by mouth daily. Take one-half tablet (2.5mg ) by mouth on Mondays and Thursdays ONLY, then take one tablet (5mg )on all other days. **Take at 4:00 (1600)pm daily**    . OMEPRAZOLE 20 MG PO CPDR Oral Take 20 mg by mouth 3 (three) times a week.     Marland Kitchen ONDANSETRON HCL 4 MG PO TABS Oral Take 1 tablet (4 mg total) by mouth every 8 (eight) hours as needed for nausea. 12 tablet 0    BP 145/60  Pulse 68  Temp(Src) 98.6 F (37 C) (Oral)  Resp 18  Ht 5\' 4"  (1.626 m)  Wt 154 lb (69.854 kg)  BMI 26.43 kg/m2  SpO2 98%  Physical Exam  Nursing note and vitals reviewed. Constitutional: She is oriented to person, place, and time. She appears well-developed and well-nourished. No distress.  HENT:  Head: Normocephalic and atraumatic.  Eyes: EOM are normal. Pupils are equal, round, and  reactive to light.  Neck: Neck supple. No tracheal deviation present.  Cardiovascular: Normal rate and regular rhythm.  Exam reveals no gallop and no friction rub.   No murmur heard. Pulmonary/Chest: Effort normal. No respiratory distress. She has no wheezes. She has no rales.  Abdominal: Soft. She exhibits no distension. There is no tenderness.  Musculoskeletal: Normal range of motion. She exhibits no edema.  Neurological: She is alert and oriented to person, place, and time. No sensory deficit.  Skin: Skin is warm and dry.       Left flank with small red raised region with no drainage fluctuance or swelling noted.   Psychiatric: She has a normal mood and affect. Her behavior is normal.    ED Course  Procedures (including critical care time)  DIAGNOSTIC STUDIES: Oxygen Saturation is 94% on room air, normal by my interpretation.    COORDINATION OF CARE: 1:34PM-Patient informed of  current plan for treatment and evaluation and agrees with plan at this time.    Date: 10/10/2011  Rate: 72  Rhythm: normal sinus rhythm  QRS Axis: normal  Intervals: normal  ST/T Wave abnormalities: normal  Conduction Disutrbances:none  Narrative Interpretation:   Old EKG Reviewed: unchanged   Labs Reviewed - No data to display No results found.   1. Nausea       MDM  Nausea is likely due to the doxycycline she is taking. There's no evidence for acute tick fever. Doubt metabolic instability, impending vascular collapse or severe allergic reaction. She is stable for discharge.      I personally performed the services described in this documentation, which was scribed in my presence. The recorded information has been reviewed and considered.     Plan: Home Medications- usual; Home Treatments- bland diet; Recommended follow up- PCP prn  Flint Melter, MD 10/10/11 1536

## 2011-10-10 NOTE — ED Notes (Signed)
Pt states she is ready to go home 

## 2012-01-29 ENCOUNTER — Inpatient Hospital Stay (HOSPITAL_COMMUNITY)
Admission: EM | Admit: 2012-01-29 | Discharge: 2012-01-30 | DRG: 310 | Disposition: A | Discharge status: Home / Self Care | Payer: Medicare Other | Attending: Internal Medicine | Admitting: Internal Medicine

## 2012-01-29 ENCOUNTER — Emergency Department (HOSPITAL_COMMUNITY): Payer: Medicare Other

## 2012-01-29 ENCOUNTER — Encounter (HOSPITAL_COMMUNITY): Payer: Self-pay

## 2012-01-29 DIAGNOSIS — Z881 Allergy status to other antibiotic agents status: Secondary | ICD-10-CM

## 2012-01-29 DIAGNOSIS — Z880 Allergy status to penicillin: Secondary | ICD-10-CM

## 2012-01-29 DIAGNOSIS — I10 Essential (primary) hypertension: Secondary | ICD-10-CM | POA: Diagnosis present

## 2012-01-29 DIAGNOSIS — I4891 Unspecified atrial fibrillation: Principal | ICD-10-CM | POA: Diagnosis present

## 2012-01-29 DIAGNOSIS — Z882 Allergy status to sulfonamides status: Secondary | ICD-10-CM

## 2012-01-29 DIAGNOSIS — Z885 Allergy status to narcotic agent status: Secondary | ICD-10-CM

## 2012-01-29 DIAGNOSIS — F411 Generalized anxiety disorder: Secondary | ICD-10-CM | POA: Diagnosis present

## 2012-01-29 DIAGNOSIS — K219 Gastro-esophageal reflux disease without esophagitis: Secondary | ICD-10-CM | POA: Diagnosis present

## 2012-01-29 DIAGNOSIS — Z7901 Long term (current) use of anticoagulants: Secondary | ICD-10-CM

## 2012-01-29 DIAGNOSIS — F419 Anxiety disorder, unspecified: Secondary | ICD-10-CM | POA: Diagnosis present

## 2012-01-29 LAB — CBC
Hemoglobin: 14.3 g/dL (ref 12.0–15.0)
MCH: 28.8 pg (ref 26.0–34.0)
MCV: 86.5 fL (ref 78.0–100.0)
Platelets: 262 10*3/uL (ref 150–400)
RBC: 4.96 MIL/uL (ref 3.87–5.11)
WBC: 8.7 10*3/uL (ref 4.0–10.5)

## 2012-01-29 LAB — TSH: TSH: 2.379 u[IU]/mL (ref 0.350–4.500)

## 2012-01-29 LAB — BASIC METABOLIC PANEL
CO2: 25 mEq/L (ref 19–32)
Calcium: 9.6 mg/dL (ref 8.4–10.5)
Chloride: 104 mEq/L (ref 96–112)
Glucose, Bld: 138 mg/dL — ABNORMAL HIGH (ref 70–99)
Sodium: 138 mEq/L (ref 135–145)

## 2012-01-29 LAB — T4, FREE: Free T4: 1.03 ng/dL (ref 0.80–1.80)

## 2012-01-29 LAB — PROTIME-INR
INR: 3.03 — ABNORMAL HIGH (ref 0.00–1.49)
Prothrombin Time: 31.9 seconds — ABNORMAL HIGH (ref 11.6–15.2)

## 2012-01-29 MED ORDER — TRAMADOL HCL 50 MG PO TABS
50.0000 mg | ORAL_TABLET | Freq: Four times a day (QID) | ORAL | Status: DC | PRN
  Administered 2012-01-29: 50 mg via ORAL
  Filled 2012-01-29: qty 1

## 2012-01-29 MED ORDER — DILTIAZEM HCL 25 MG/5ML IV SOLN
15.0000 mg | Freq: Once | INTRAVENOUS | Status: AC
  Administered 2012-01-29: 15 mg via INTRAVENOUS

## 2012-01-29 MED ORDER — DILTIAZEM HCL 100 MG IV SOLR
5.0000 mg/h | Freq: Once | INTRAVENOUS | Status: AC
  Administered 2012-01-29: 5 mg/h via INTRAVENOUS
  Filled 2012-01-29: qty 100

## 2012-01-29 MED ORDER — PANTOPRAZOLE SODIUM 40 MG PO TBEC: 40.0000 mg | DELAYED_RELEASE_TABLET | Freq: Every day | ORAL | Status: DC

## 2012-01-29 MED ORDER — ONDANSETRON HCL 4 MG PO TABS: 4.0000 mg | ORAL_TABLET | Freq: Four times a day (QID) | ORAL | Status: DC | PRN

## 2012-01-29 MED ORDER — SODIUM CHLORIDE 0.9 % IJ SOLN
3.0000 mL | Freq: Two times a day (BID) | INTRAMUSCULAR | Status: DC
  Administered 2012-01-29: 3 mL via INTRAVENOUS
  Filled 2012-01-29 (×2): qty 3

## 2012-01-29 MED ORDER — DIPHENHYDRAMINE HCL 50 MG/ML IJ SOLN
12.5000 mg | Freq: Once | INTRAMUSCULAR | Status: AC
  Administered 2012-01-29: 12.5 mg via INTRAVENOUS
  Filled 2012-01-29: qty 1

## 2012-01-29 MED ORDER — WARFARIN SODIUM 1 MG PO TABS
1.0000 mg | ORAL_TABLET | Freq: Once | ORAL | Status: AC
  Administered 2012-01-29: 1 mg via ORAL
  Filled 2012-01-29: qty 1

## 2012-01-29 MED ORDER — ACETAMINOPHEN 325 MG PO TABS: 650.0000 mg | ORAL_TABLET | Freq: Four times a day (QID) | ORAL | Status: DC | PRN

## 2012-01-29 MED ORDER — ONDANSETRON HCL 4 MG/2ML IJ SOLN: 4.0000 mg | Freq: Four times a day (QID) | INTRAMUSCULAR | Status: DC | PRN

## 2012-01-29 MED ORDER — SODIUM CHLORIDE 0.9 % IV SOLN
Freq: Once | INTRAVENOUS | Status: AC
  Administered 2012-01-29: 500 mL via INTRAVENOUS

## 2012-01-29 MED ORDER — METOPROLOL SUCCINATE ER 50 MG PO TB24
50.0000 mg | ORAL_TABLET | Freq: Every day | ORAL | Status: DC
Start: 2012-01-30 — End: 2012-01-30
  Administered 2012-01-30: 50 mg via ORAL
  Filled 2012-01-29: qty 1

## 2012-01-29 MED ORDER — ACETAMINOPHEN 650 MG RE SUPP: 650.0000 mg | Freq: Four times a day (QID) | RECTAL | Status: DC | PRN

## 2012-01-29 MED ORDER — ALPRAZOLAM 0.5 MG PO TABS
0.5000 mg | ORAL_TABLET | Freq: Every day | ORAL | Status: DC
  Administered 2012-01-29: 0.5 mg via ORAL
  Filled 2012-01-29: qty 1

## 2012-01-29 MED ORDER — HYDROCODONE-ACETAMINOPHEN 5-325 MG PO TABS: 0.5000 | ORAL_TABLET | Freq: Two times a day (BID) | ORAL | Status: DC | PRN

## 2012-01-29 MED ORDER — SODIUM CHLORIDE 0.9 % IV SOLN
INTRAVENOUS | Status: AC
  Administered 2012-01-29: 16:00:00 via INTRAVENOUS

## 2012-01-29 MED ORDER — WARFARIN SODIUM 5 MG PO TABS: 5.0000 mg | ORAL_TABLET | Freq: Every day | ORAL | Status: DC

## 2012-01-29 MED ORDER — WARFARIN - PHARMACIST DOSING INPATIENT: Freq: Every day | Status: DC

## 2012-01-29 NOTE — ED Notes (Signed)
Pt reports has history of afib.  Says this am round 0900 started feeling heart racing and having chest pain radiating to r arm.  Also c/o SOB, denies nausea.

## 2012-01-29 NOTE — ED Notes (Signed)
Patient c/o itching in arm and hand with IV after IV bolus of Cardizem given. EDP made aware, verbal order given.

## 2012-01-29 NOTE — Progress Notes (Signed)
Pink armband removed from patients L arm. Patient states that she only had a small piece of her breast removed (half a pea size) and had never been told she had a restricted extremity.

## 2012-01-29 NOTE — Progress Notes (Signed)
ANTICOAGULATION CONSULT NOTE - Initial Consult  Pharmacy Consult for Warfarin Indication: atrial fibrillation  Allergies  Allergen Reactions  . Cephalosporins Itching  . Hydrocodone Itching and Nausea Only    In large doses  . Morphine And Related Itching  . Penicillins Other (See Comments)    Unknown  . Sulfa Antibiotics Other (See Comments)    Unknown    Patient Measurements: Height: 5\' 4"  (162.6 cm) Weight: 160 lb (72.576 kg) IBW/kg (Calculated) : 54.7   Vital Signs: Temp: 98 F (36.7 C) (09/08 1557) Temp src: Oral (09/08 1557) BP: 150/86 mmHg (09/08 1557) Pulse Rate: 81  (09/08 1557)  Labs:  Basename 01/29/12 1128  HGB 14.3  HCT 42.9  PLT 262  APTT --  LABPROT 31.9*  INR 3.03*  HEPARINUNFRC --  CREATININE 0.67  CKTOTAL --  CKMB --  TROPONINI <0.30    Estimated Creatinine Clearance: 49.3 ml/min (by C-G formula based on Cr of 0.67).   Medical History: Past Medical History  Diagnosis Date  . Hypertension   . Atrial fibrillation   . Cancer     left side    Medications:  Prescriptions prior to admission  Medication Sig Dispense Refill  . ALPRAZolam (XANAX) 0.5 MG tablet Take 0.5 mg by mouth at bedtime.        Marland Kitchen diltiazem (CARDIZEM) 60 MG tablet Take 60 mg by mouth daily.        Marland Kitchen HYDROcodone-acetaminophen (NORCO) 5-325 MG per tablet Take 0.5 tablets by mouth 2 (two) times daily as needed. For pain      . metoprolol succinate (TOPROL-XL) 25 MG 24 hr tablet Take 25 mg by mouth daily.        Marland Kitchen omeprazole (PRILOSEC) 20 MG capsule Take 20 mg by mouth 3 (three) times a week.       . traMADol (ULTRAM) 50 MG tablet Take 50 mg by mouth every 6 (six) hours as needed. Pain      . warfarin (COUMADIN) 5 MG tablet Take 5 mg by mouth daily.         Assessment: Okay for Protocol, 78 y.o. Female on chronic warfarin therapy for afib. Slightly Elevated INR on admission.  Goal of Therapy:  INR 2-3   Plan:  Warfarin 1mg  po x 1 tonight. Daily PT / INR.  Mady Gemma 01/29/2012,4:09 PM

## 2012-01-29 NOTE — ED Notes (Signed)
Patient c/o headache after Cardizem bolus-EDP made aware of patient's h/a, blood pressure, and heart rate. Approval given to continue with Cardizem drip at 5mg /hr, verbal orders given.

## 2012-01-29 NOTE — ED Provider Notes (Signed)
History   This chart was scribed for Lyanne Co, MD by Albertha Ghee Rifaie. This patient was seen in room APA19/APA19 and the patient's care was started at 11:23 AM.   CSN: [id number]  Arrival date & time 01/29/12  1113   First MD Initiated Contact with Patient 01/29/12 1123      Chief Complaint  Patient presents with  . Chest Pain  . Tachycardia    HPI  The history is provided by the patient. No language interpreter was used.   [name] is a 78 y.o. female who presents to the Emergency Department complaining of A. Fib that started about 2 hours ago with associated with gradual worsening, constant palpitations and CP that radiates to right arm. She reports having a history of paroxysmal A. Fib, is on coumadin currently for it and states that she can tell when she's in it. Pt also reports chronic SOB with minimal exertion but denies changes. She denies nausea, emesis, diarrhea or hematochezia as associated symptoms. She also has a h/o HTN and CA. She denies smoking and alcohol use.  Last visit for the same in this ED was June 9th, 2013.  Cardiologist is with [facility].   Past Medical History  Diagnosis Date  . Hypertension   . Atrial fibrillation   . Cancer     left side    Past Surgical History  Procedure Date  . Breast surgery   . Appendectomy   . Hernia repair   . Colon surgery     Family History  Problem Relation Age of Onset  . Heart disease    . Arthritis    . Cancer      History  Substance Use Topics  . Smoking status: Never Smoker   . Smokeless tobacco: Not on file  . Alcohol Use: No    OB History    Grav Para Term Preterm Abortions TAB SAB Ect Mult Living   2 1   1  1   1       Review of Systems  A complete 10 system review of systems was obtained and all systems are negative except as noted in the HPI and PMH.    Allergies  Cephalosporins; Hydrocodone; Morphine and related; Penicillins; and Sulfa antibiotics  Home  Medications   Current Outpatient Rx  Name Route Sig Dispense Refill  . ALPRAZOLAM 0.5 MG PO TABS Oral Take 0.5 mg by mouth at bedtime.      Marland Kitchen DILTIAZEM HCL 60 MG PO TABS Oral Take 60 mg by mouth daily.      Marland Kitchen HYDROCODONE-ACETAMINOPHEN 5-325 MG PO TABS Oral Take 0.5 tablets by mouth 2 (two) times daily. For pain    . METOPROLOL SUCCINATE ER 25 MG PO TB24 Oral Take 25 mg by mouth daily.      Marland Kitchen OMEPRAZOLE 20 MG PO CPDR Oral Take 20 mg by mouth 3 (three) times a week.     . WARFARIN SODIUM 5 MG PO TABS Oral Take 2.5-5 mg by mouth daily. Take one-half tablet (2.5mg ) by mouth on Mondays and Thursdays ONLY, then take one tablet (5mg )on all other days. **Take at 4:00 (1600)pm daily**      Triage vitals: Ht 5\' 4"  (1.626 m)  Wt 154 lb (69.854 kg)  BMI 26.43 kg/m2  Physical Exam  Nursing note and vitals reviewed. Constitutional: She is oriented to person, place, and time. She appears well-developed and well-nourished. No distress.  HENT:  Head: Normocephalic and atraumatic.  Eyes: EOM are normal.  Neck: Normal range of motion.  Cardiovascular: Normal heart sounds.  An irregularly irregular rhythm present. Tachycardia present.   No murmur heard. Pulmonary/Chest: Effort normal and breath sounds normal.  Abdominal: Soft. She exhibits no distension. There is no tenderness.  Musculoskeletal: Normal range of motion.  Neurological: She is alert and oriented to person, place, and time.  Skin: Skin is warm and dry.  Psychiatric: She has a normal mood and affect. Judgment normal.    ED Course  Procedures (including critical care time)   Date: 01/29/2012  Rate: 133  Rhythm: afib with RVR  QRS Axis: normal  Intervals: normal  ST/T Wave abnormalities: normal  Conduction Disutrbances: none  Narrative Interpretation:   Old EKG Reviewed: changed from prior ecg, no longer sinus rhythm   Date: 01/29/2012  Rate: 59  Rhythm: afib with controlled rate  QRS Axis: normal  Intervals: normal  ST/T  Wave abnormalities: normal  Conduction Disutrbances: none  Narrative Interpretation:   Old EKG Reviewed: changed from priorNo significant changes noted      DIAGNOSTIC STUDIES: Oxygen Saturation is 97% on room air, adequate by my interpretation.    COORDINATION OF CARE: 11:45AM-Discussed treatment plan which includes Benadryl and IV fluids with pt at bedside and pt agreed to plan.  12:50PM-Pt rechecked and feels improved. HR checked in the 70s. Labs Reviewed  BASIC METABOLIC PANEL - Abnormal; Notable for the following:    Glucose, Bld 138 (*)     GFR calc non Af Amer 77 (*)     GFR calc Af Amer 90 (*)     All other components within normal limits  PROTIME-INR - Abnormal; Notable for the following:    Prothrombin Time 31.9 (*)     INR 3.03 (*)     All other components within normal limits  CBC  TROPONIN I  TSH  T4, FREE   Dg Chest Portable 1 View  01/29/2012  *RADIOLOGY REPORT*  Clinical Data: Chest pain and tachycardia  PORTABLE CHEST - 1 VIEW  Comparison: 09/29/2011 and 04/01/2011  Findings: Cardiac leads project over the chest.  Cardiomediastinal contours are stable.  Heart size appears upper normal to mildly enlarged for portable technique.  The lungs are clear.  No focal infiltrate, effusion, or pneumothorax is identified.  The trachea is midline.  No acute bony abnormalities identified.  IMPRESSION: Stable examination.  Borderline cardiomegaly without acute cardiopulmonary findings.   Original Report Authenticated By: Britta Mccreedy, M.D.     I personally reviewed the imaging tests through PACS system  I reviewed available ER/hospitalization records thought the EMR   1. Atrial fibrillation with rapid ventricular response       MDM  The patient has a history of paroxysmal atrial fibrillation presents with atrial fibrillation with rapid ventricular response.  She was having chest pain and shortness of breath.  She is now rate controlled but still in atrial fibrillation.   She reports she is normally in sinus rhythm.  Facial be admitted at least for observation overnight.  Still under 5 although this was recently stopped secondary to transient bradycardia and hypotension pressured on the 70s.  This was stopped she was given a 500 cc bolus.      I personally performed the services described in this documentation, which was scribed in my presence. The recorded information has been reviewed and considered.      Lyanne Co, MD 01/29/12 [phone]

## 2012-01-29 NOTE — H&P (Signed)
[facility] History and Physical  [name] ZHY:[id number] DOB: 1923/06/07 DOA: 01/29/2012  Referring physician: Dr. Patria Mane PCP: Lilyan Punt, MD   Chief Complaint: palpitations  HPI: [name] is a 78 y.o. female  This is a very pleasant 78 year old female with a history of paroxysmal atrial fibrillation. Patient reports that at approximately 8:45 this morning she felt acute onset of palpitations. She she said she also may have felt some epigastric discomfort, but relates this to acid reflux. She has had some shortness of breath but reports shortness of breath on exertion for many months now. She notices her breathing getting significantly worse after approximately half an hour of gardening. She felt that her palpitations were similar to what she feels when she goes back into atrial fibrillation. She came to the emergency room for evaluation where she was noted to be in atrial fibrillation with rapid ventricular response. She's been referred for admission.  Review of Systems: The patient denies anorexia, fever, weight loss,, vision loss, decreased hearing, hoarseness, chest pain, syncope, peripheral edema, balance deficits, hemoptysis, abdominal pain, melena, hematochezia, severe indigestion/heartburn, hematuria, incontinence, genital sores,  suspicious skin lesions, transient blindness, difficulty walking, depression, unusual weight change, abnormal bleeding, enlarged lymph nodes, angioedema, and breast masses.    Past Medical History  Diagnosis Date  . Hypertension   . Atrial fibrillation   . Cancer     left side   Past Surgical History  Procedure Date  . Breast surgery   . Appendectomy   . Hernia repair   . Colon surgery    Social History:  reports that she has never smoked. She does not have any smokeless tobacco history on file. She reports that she does not drink alcohol or use illicit drugs. She is independent with her ADLs  Allergies  Allergen Reactions  .  Cephalosporins Itching  . Hydrocodone Itching and Nausea Only    In large doses  . Morphine And Related Itching  . Penicillins Other (See Comments)    Unknown  . Sulfa Antibiotics Other (See Comments)    Unknown    Family History  Problem Relation Age of Onset  . Heart disease    . Arthritis    . Cancer      Prior to Admission medications   Medication Sig Start Date End Date Taking? Authorizing Provider  ALPRAZolam Prudy Feeler) 0.5 MG tablet Take 0.5 mg by mouth at bedtime.     Yes Historical Provider, MD  diltiazem (CARDIZEM) 60 MG tablet Take 60 mg by mouth daily.     Yes Historical Provider, MD  HYDROcodone-acetaminophen (NORCO) 5-325 MG per tablet Take 0.5 tablets by mouth 2 (two) times daily as needed. For pain   Yes Historical Provider, MD  metoprolol succinate (TOPROL-XL) 25 MG 24 hr tablet Take 25 mg by mouth daily.     Yes Historical Provider, MD  omeprazole (PRILOSEC) 20 MG capsule Take 20 mg by mouth 3 (three) times a week.    Yes Historical Provider, MD  traMADol (ULTRAM) 50 MG tablet Take 50 mg by mouth every 6 (six) hours as needed. Pain   Yes Historical Provider, MD  warfarin (COUMADIN) 5 MG tablet Take 5 mg by mouth daily.    Yes Historical Provider, MD   Physical Exam: Filed Vitals:   01/29/12 1300 01/29/12 1401 01/29/12 1556 01/29/12 1557  BP: 121/75 121/57  150/86  Pulse: 135 85  81  Temp:    98 F (36.7 C)  TempSrc:  Oral  Resp: 16 22  20   Height:   5\' 4"  (1.626 m)   Weight:   72.576 kg (160 lb)   SpO2: 97% 100%  93%     General:  No acute distress, sitting comfortably in bed  Eyes: Pupils are equal round and to light  ENT: Mucous members are moist, no pharyngeal erythema  Neck: Supple  Cardiovascular: S1, S2, irregular rate and rhythm, no pedal edema  Respiratory: Clear to auscultation bilaterally  Abdomen: Soft, nontender, nondistended, bowel sounds are active  Skin: No visible rashes  Musculoskeletal: Deferred  Psychiatric: Normal  affect, cooperative with exam  Neurologic: Grossly intact, nonfocal  Labs on Admission:  Basic Metabolic Panel:  Lab 01/29/12 1610  NA 138  K 3.7  CL 104  CO2 25  GLUCOSE 138*  BUN 13  CREATININE 0.67  CALCIUM 9.6  MG --  PHOS --   Liver Function Tests: No results found for this basename: AST:5,ALT:5,ALKPHOS:5,BILITOT:5,PROT:5,ALBUMIN:5 in the last 168 hours No results found for this basename: LIPASE:5,AMYLASE:5 in the last 168 hours No results found for this basename: AMMONIA:5 in the last 168 hours CBC:  Lab 01/29/12 1128  WBC 8.7  NEUTROABS --  HGB 14.3  HCT 42.9  MCV 86.5  PLT 262   Cardiac Enzymes:  Lab 01/29/12 1128  CKTOTAL --  CKMB --  CKMBINDEX --  TROPONINI <0.30    BNP (last 3 results) No results found for this basename: PROBNP:3 in the last 8760 hours CBG: No results found for this basename: GLUCAP:5 in the last 168 hours  Radiological Exams on Admission: Dg Chest Portable 1 View  01/29/2012  *RADIOLOGY REPORT*  Clinical Data: Chest pain and tachycardia  PORTABLE CHEST - 1 VIEW  Comparison: 09/29/2011 and 04/01/2011  Findings: Cardiac leads project over the chest.  Cardiomediastinal contours are stable.  Heart size appears upper normal to mildly enlarged for portable technique.  The lungs are clear.  No focal infiltrate, effusion, or pneumothorax is identified.  The trachea is midline.  No acute bony abnormalities identified.  IMPRESSION: Stable examination.  Borderline cardiomegaly without acute cardiopulmonary findings.   Original Report Authenticated By: Britta Mccreedy, M.D.     EKG: Independently reviewed. She initially showed rapid atrial fibrillation followed by slow atrial fibrillation  Assessment/Plan Principal Problem:  *Atrial fibrillation with RVR Active Problems:  Anxiety  GERD (gastroesophageal reflux disease)  Chronic anticoagulation   1. Atrial fibrillation with rapid ventricular response. Patient was given IV Cardizem bolus and  started on a continuous infusion in the emergency room. She subsequently became bradycardic and hypotensive and Cardizem was discontinued. Her heart rate is currently in the 80s to 90s at this point. Her blood pressure has improved. She reports compliance with her medication regimen. Will check cardiac markers as well as thyroid studies. She is on 25 mg of Toprol once a day as well as 60 mg of diltiazem once a day. We will discontinue her diltiazem and increase her Toprol dosing. If patient's heart rate remains stable overnight and she does not have any new complaints, it would be reasonable for her to followup with her outpatient cardiologist. She is already anticoagulated with Coumadin and is therapeutic. We will continue the remainder of her outpatient medications. Further orders per the clinical course.  Code Status: Full code Family Communication: Discussed with patient at bedside Disposition Plan: Physical therapy evaluation the morning, possible discharge tomorrow  Time spent: 60 minutes  MEMON,JEHANZEB [facility] Pager [phone]  If 7PM-7AM, please  contact night-coverage [email or web address] Password [facility] 01/29/2012, 5:52 PM

## 2012-01-30 LAB — BASIC METABOLIC PANEL
BUN: 11 mg/dL (ref 6–23)
Chloride: 108 mEq/L (ref 96–112)
Creatinine, Ser: 0.68 mg/dL (ref 0.50–1.10)
GFR calc Af Amer: 89 mL/min — ABNORMAL LOW (ref >90)
Glucose, Bld: 112 mg/dL — ABNORMAL HIGH (ref 70–99)

## 2012-01-30 LAB — PROTIME-INR
INR: 3.27 — ABNORMAL HIGH (ref 0.00–1.49)
Prothrombin Time: 33.8 seconds — ABNORMAL HIGH (ref 11.6–15.2)

## 2012-01-30 LAB — TROPONIN I: Troponin I: <0.3 ng/mL (ref <0.30)

## 2012-01-30 MED ORDER — METOPROLOL SUCCINATE ER 50 MG PO TB24: 50.0000 mg | ORAL_TABLET | Freq: Every day | ORAL | Status: DC

## 2012-01-30 MED ORDER — POTASSIUM CHLORIDE CRYS ER 20 MEQ PO TBCR
40.0000 meq | EXTENDED_RELEASE_TABLET | Freq: Once | ORAL | Status: AC
  Administered 2012-01-30: 40 meq via ORAL
  Filled 2012-01-30: qty 2

## 2012-01-30 NOTE — Progress Notes (Signed)
Discharge instructions given to pt, pt verbalized understanding.  IV d/c and within normal limits.  Pt stable and left floor via wheelchair accompanied by nurse.

## 2012-01-30 NOTE — Progress Notes (Signed)
UR Chart Review Completed  

## 2012-01-30 NOTE — Discharge Summary (Signed)
Physician Discharge Summary  [name] EAV:[id number] DOB: 03-Aug-1923 DOA: 01/29/2012  PCP: Lilyan Punt, MD Cardiologist: Dr. Royann Shivers with [facility]  Admit date: 01/29/2012 Discharge date: 01/30/2012  Recommendations for Outpatient Follow-up:  1. Follow up with [facility] on 02/07/12 at 2:00pm 2. Consider 2D echo to be done as outpatient 3. Follow up with primary care doctor in 2 weeks  Discharge Diagnoses:  Principal Problem:  *Atrial fibrillation with RVR Active Problems:  Anxiety  GERD (gastroesophageal reflux disease)  Chronic anticoagulation   Discharge Condition: Improved  Diet recommendation: Low-salt  Filed Weights   01/29/12 1119 01/29/12 1556 01/30/12 0500  Weight: 69.854 kg (154 lb) 72.576 kg (160 lb) 72.122 kg (159 lb)    History of present illness:  This is a very pleasant 78 year old female with a history of paroxysmal atrial fibrillation. Patient reports that at approximately 8:45 this morning she felt acute onset of palpitations. She she said she also may have felt some epigastric discomfort, but relates this to acid reflux. She has had some shortness of breath but reports shortness of breath on exertion for many months now. She notices her breathing getting significantly worse after approximately half an hour of gardening. She felt that her palpitations were similar to what she feels when she goes back into atrial fibrillation. She came to the emergency room for evaluation where she was noted to be in atrial fibrillation with rapid ventricular response. She's been referred for admission.   Hospital Course:  This lady was admitted to the hospital with rapid atrial fibrillation. She was initially started on Cardizem infusion the emergency room but quickly became bradycardic and hypotensive. Cardizem drip was then discontinued, and patient maintained her heart rate in the 80s to 90s. She remains in atrial fibrillation. Her  outpatient dose of Toprol was increased from 25 mg to 50 mg daily. We have also discontinued her outpatient dose of diltiazem 60 mg once a day to simplify her regimen. She is therapeutic on Coumadin. Patient feels significantly better no longer has any palpitations. She is insistent on being discharged home today. Consideration for outpatient echocardiogram with her cardiologist. Her BNP peptide was mildly elevated but patient did not have any signs of respiratory compromise. She is ambulating without difficulty and does not appear to be short of breath. She does not have any other signs of volume overload at present. This could be further managed in the outpatient setting The patient plans on following up with her cardiologist in the next one week.  Procedures:  none  Consultations:  none  Discharge Exam: Filed Vitals:   01/29/12 2145 01/30/12 0140 01/30/12 0500 01/30/12 0954  BP: 126/78 138/75  126/77  Pulse: 92 75  74  Temp: 97.9 F (36.6 C) 97.9 F (36.6 C)    TempSrc: Oral Oral    Resp: 20 20    Height:      Weight:   72.122 kg (159 lb)   SpO2: 95% 94%      General: NAD Cardiovascular: s1, s2, irregular, no pedal edema Respiratory: cta b  Discharge Instructions  Discharge Orders    Future Orders Please Complete By Expires   Diet - low sodium heart healthy      Increase activity slowly      Call MD for:  persistant dizziness or light-headedness      Call MD for:  difficulty breathing, headache or visual disturbances        Medication List  As of  01/30/2012 10:30 AM   STOP taking these medications         diltiazem 60 MG tablet         TAKE these medications         ALPRAZolam 0.5 MG tablet   Commonly known as: XANAX   Take 0.5 mg by mouth at bedtime.      HYDROcodone-acetaminophen 5-325 MG per tablet   Commonly known as: NORCO/VICODIN   Take 0.5 tablets by mouth 2 (two) times daily as needed. For pain      metoprolol succinate 50 MG 24 hr tablet   Commonly  known as: TOPROL-XL   Take 1 tablet (50 mg total) by mouth daily.      omeprazole 20 MG capsule   Commonly known as: PRILOSEC   Take 20 mg by mouth 3 (three) times a week.      traMADol 50 MG tablet   Commonly known as: ULTRAM   Take 50 mg by mouth every 6 (six) hours as needed. Pain      warfarin 5 MG tablet   Commonly known as: COUMADIN   Take 5 mg by mouth daily.           Follow-up Information    Follow up with Thurmon Fair, MD. (in 1 week)    Contact information:   [address] [place] [address] [phone]       Follow up with Lilyan Punt, MD. (in 2 weeks)    Contact information:   [address] [place] [address] [phone]           The results of significant diagnostics from this hospitalization (including imaging, microbiology, ancillary and laboratory) are listed below for reference.    Significant Diagnostic Studies: Dg Chest Portable 1 View  01/29/2012  *RADIOLOGY REPORT*  Clinical Data: Chest pain and tachycardia  PORTABLE CHEST - 1 VIEW  Comparison: 09/29/2011 and 04/01/2011  Findings: Cardiac leads project over the chest.  Cardiomediastinal contours are stable.  Heart size appears upper normal to mildly enlarged for portable technique.  The lungs are clear.  No focal infiltrate, effusion, or pneumothorax is identified.  The trachea is midline.  No acute bony abnormalities identified.  IMPRESSION: Stable examination.  Borderline cardiomegaly without acute cardiopulmonary findings.   Original Report Authenticated By: Britta Mccreedy, M.D.     Microbiology: No results found for this or any previous visit (from the past 240 hour(s)).   Labs: Basic Metabolic Panel:  Lab 01/30/12 5784 01/29/12 1128  NA 140 138  K 3.3* 3.7  CL 108 104  CO2 25 25  GLUCOSE 112* 138*  BUN 11 13  CREATININE 0.68 0.67  CALCIUM 8.9 9.6  MG -- --  PHOS -- --   Liver Function Tests: No results found for this basename:  AST:5,ALT:5,ALKPHOS:5,BILITOT:5,PROT:5,ALBUMIN:5 in the last 168 hours No results found for this basename: LIPASE:5,AMYLASE:5 in the last 168 hours No results found for this basename: AMMONIA:5 in the last 168 hours CBC:  Lab 01/29/12 1128  WBC 8.7  NEUTROABS --  HGB 14.3  HCT 42.9  MCV 86.5  PLT 262   Cardiac Enzymes:  Lab 01/30/12 0903 01/30/12 0215 01/29/12 1811 01/29/12 1128  CKTOTAL -- -- -- --  CKMB -- -- -- --  CKMBINDEX -- -- -- --  TROPONINI <0.30 <0.30 <0.30 <0.30   BNP: BNP (last 3 results)  Basename 01/29/12 1811  PROBNP 1292.0*   CBG: No results found for this basename: GLUCAP:5  in the last 168 hours  Time coordinating discharge: greater than 30 minutes  Signed:  Omario Ander  [facility] 01/30/2012, 10:30 AM

## 2012-01-30 NOTE — Progress Notes (Signed)
ANTICOAGULATION CONSULT NOTE - Initial Consult  Pharmacy Consult for Warfarin Indication: atrial fibrillation  Allergies  Allergen Reactions  . Cephalosporins Itching  . Hydrocodone Itching and Nausea Only    In large doses  . Morphine And Related Itching  . Penicillins Other (See Comments)    Unknown  . Sulfa Antibiotics Other (See Comments)    Unknown   Patient Measurements: Height: 5\' 4"  (162.6 cm) Weight: 159 lb (72.122 kg) IBW/kg (Calculated) : 54.7   Vital Signs: Temp: 97.9 F (36.6 C) (09/09 0140) Temp src: Oral (09/09 0140) BP: 126/77 mmHg (09/09 0954) Pulse Rate: 74  (09/09 0954)  Labs:  Alvira Philips 01/30/12 0903 01/30/12 0215 01/30/12 0214 01/29/12 1811 01/29/12 1128  HGB -- -- -- -- 14.3  HCT -- -- -- -- 42.9  PLT -- -- -- -- 262  APTT -- -- -- -- --  LABPROT -- -- 33.8* -- 31.9*  INR -- -- 3.27* -- 3.03*  HEPARINUNFRC -- -- -- -- --  CREATININE -- -- 0.68 -- 0.67  CKTOTAL -- -- -- -- --  CKMB -- -- -- -- --  TROPONINI <0.30 <0.30 -- <0.30 --   Estimated Creatinine Clearance: 49.2 ml/min (by C-G formula based on Cr of 0.68).  Medical History: Past Medical History  Diagnosis Date  . Hypertension   . Atrial fibrillation   . Cancer     left side   Medications:  Prescriptions prior to admission  Medication Sig Dispense Refill  . ALPRAZolam (XANAX) 0.5 MG tablet Take 0.5 mg by mouth at bedtime.        Marland Kitchen diltiazem (CARDIZEM) 60 MG tablet Take 60 mg by mouth daily.        Marland Kitchen HYDROcodone-acetaminophen (NORCO) 5-325 MG per tablet Take 0.5 tablets by mouth 2 (two) times daily as needed. For pain      . metoprolol succinate (TOPROL-XL) 25 MG 24 hr tablet Take 25 mg by mouth daily.        Marland Kitchen omeprazole (PRILOSEC) 20 MG capsule Take 20 mg by mouth 3 (three) times a week.       . traMADol (ULTRAM) 50 MG tablet Take 50 mg by mouth every 6 (six) hours as needed. Pain      . warfarin (COUMADIN) 5 MG tablet Take 5 mg by mouth daily.        Assessment: Okay for  Protocol, 78 y.o. Female on chronic warfarin therapy for afib. INR has risen above goal.  Goal of Therapy:  INR 2-3   Plan: No coumadin today. Daily PT / INR.  Margo Aye, Latanja Lehenbauer A 01/30/2012,10:24 AM

## 2012-07-27 ENCOUNTER — Ambulatory Visit: Payer: Self-pay | Admitting: Cardiovascular Disease

## 2012-07-27 DIAGNOSIS — Z7901 Long term (current) use of anticoagulants: Secondary | ICD-10-CM | POA: Insufficient documentation

## 2012-07-27 DIAGNOSIS — I4891 Unspecified atrial fibrillation: Secondary | ICD-10-CM

## 2012-08-08 ENCOUNTER — Telehealth: Payer: Self-pay | Admitting: Family Medicine

## 2012-08-08 NOTE — Telephone Encounter (Signed)
[name] is needing an order for left breast mass ultrasound that they did for patient yesterday while she was already in the office. fax-  [phone]

## 2012-08-08 NOTE — Telephone Encounter (Signed)
Order faxed to [facility] for left breast ultrasound.

## 2012-08-09 ENCOUNTER — Telehealth: Payer: Self-pay | Admitting: Family Medicine

## 2012-08-09 NOTE — Telephone Encounter (Signed)
[name] from the [facility] is calling to check on the order she requested yesterday for the L Breast Mass   Fax number-[phone]

## 2012-08-09 NOTE — Telephone Encounter (Signed)
Order faxed to [facility] for Left breast ultrasound.

## 2012-09-22 ENCOUNTER — Encounter: Payer: Self-pay | Admitting: *Deleted

## 2012-09-26 ENCOUNTER — Encounter: Payer: Self-pay | Admitting: Cardiovascular Disease

## 2012-10-03 ENCOUNTER — Emergency Department (HOSPITAL_COMMUNITY): Payer: Medicare Other

## 2012-10-03 ENCOUNTER — Emergency Department (HOSPITAL_COMMUNITY)
Admission: EM | Admit: 2012-10-03 | Discharge: 2012-10-03 | Disposition: A | Discharge status: Home / Self Care | Payer: Medicare Other | Attending: Emergency Medicine | Admitting: Emergency Medicine

## 2012-10-03 ENCOUNTER — Encounter (HOSPITAL_COMMUNITY): Payer: Self-pay

## 2012-10-03 DIAGNOSIS — R109 Unspecified abdominal pain: Secondary | ICD-10-CM | POA: Insufficient documentation

## 2012-10-03 DIAGNOSIS — N390 Urinary tract infection, site not specified: Secondary | ICD-10-CM | POA: Insufficient documentation

## 2012-10-03 DIAGNOSIS — I4891 Unspecified atrial fibrillation: Secondary | ICD-10-CM | POA: Insufficient documentation

## 2012-10-03 DIAGNOSIS — M545 Low back pain, unspecified: Secondary | ICD-10-CM | POA: Insufficient documentation

## 2012-10-03 DIAGNOSIS — Z9089 Acquired absence of other organs: Secondary | ICD-10-CM | POA: Insufficient documentation

## 2012-10-03 DIAGNOSIS — Z79899 Other long term (current) drug therapy: Secondary | ICD-10-CM | POA: Insufficient documentation

## 2012-10-03 DIAGNOSIS — R110 Nausea: Secondary | ICD-10-CM | POA: Insufficient documentation

## 2012-10-03 DIAGNOSIS — Z9049 Acquired absence of other specified parts of digestive tract: Secondary | ICD-10-CM | POA: Insufficient documentation

## 2012-10-03 DIAGNOSIS — Z8739 Personal history of other diseases of the musculoskeletal system and connective tissue: Secondary | ICD-10-CM | POA: Insufficient documentation

## 2012-10-03 DIAGNOSIS — Z8719 Personal history of other diseases of the digestive system: Secondary | ICD-10-CM | POA: Insufficient documentation

## 2012-10-03 DIAGNOSIS — Z862 Personal history of diseases of the blood and blood-forming organs and certain disorders involving the immune mechanism: Secondary | ICD-10-CM | POA: Insufficient documentation

## 2012-10-03 DIAGNOSIS — K219 Gastro-esophageal reflux disease without esophagitis: Secondary | ICD-10-CM | POA: Insufficient documentation

## 2012-10-03 DIAGNOSIS — I10 Essential (primary) hypertension: Secondary | ICD-10-CM | POA: Insufficient documentation

## 2012-10-03 DIAGNOSIS — G8929 Other chronic pain: Secondary | ICD-10-CM | POA: Insufficient documentation

## 2012-10-03 DIAGNOSIS — Z880 Allergy status to penicillin: Secondary | ICD-10-CM | POA: Insufficient documentation

## 2012-10-03 DIAGNOSIS — Z8639 Personal history of other endocrine, nutritional and metabolic disease: Secondary | ICD-10-CM | POA: Insufficient documentation

## 2012-10-03 DIAGNOSIS — Z9889 Other specified postprocedural states: Secondary | ICD-10-CM | POA: Insufficient documentation

## 2012-10-03 HISTORY — DX: Right lower quadrant pain: R10.31

## 2012-10-03 HISTORY — DX: Other chronic pain: G89.29

## 2012-10-03 HISTORY — DX: Dorsalgia, unspecified: M54.9

## 2012-10-03 LAB — CBC WITH DIFFERENTIAL/PLATELET
Basophils Absolute: 0 10*3/uL (ref 0.0–0.1)
Basophils Relative: 0 % (ref 0–1)
Eosinophils Absolute: 0 10*3/uL (ref 0.0–0.7)
Eosinophils Relative: 0 % (ref 0–5)
HCT: 41.7 % (ref 36.0–46.0)
Hemoglobin: 14.5 g/dL (ref 12.0–15.0)
Lymphocytes Relative: 22 % (ref 12–46)
Lymphs Abs: 2.1 10*3/uL (ref 0.7–4.0)
MCH: 29.2 pg (ref 26.0–34.0)
MCHC: 34.8 g/dL (ref 30.0–36.0)
MCV: 84.1 fL (ref 78.0–100.0)
Monocytes Absolute: 0.9 10*3/uL (ref 0.1–1.0)
Monocytes Relative: 9 % (ref 3–12)
Neutro Abs: 6.3 10*3/uL (ref 1.7–7.7)
Neutrophils Relative %: 68 % (ref 43–77)
Platelets: 283 10*3/uL (ref 150–400)
RBC: 4.96 MIL/uL (ref 3.87–5.11)
RDW: 12.6 % (ref 11.5–15.5)
WBC: 9.3 10*3/uL (ref 4.0–10.5)

## 2012-10-03 LAB — COMPREHENSIVE METABOLIC PANEL
ALT: 13 U/L (ref 0–35)
AST: 21 U/L (ref 0–37)
Albumin: 3.6 g/dL (ref 3.5–5.2)
Alkaline Phosphatase: 68 U/L (ref 39–117)
BUN: 15 mg/dL (ref 6–23)
CO2: 26 mEq/L (ref 19–32)
Calcium: 9.5 mg/dL (ref 8.4–10.5)
Chloride: 107 mEq/L (ref 96–112)
Creatinine, Ser: 0.76 mg/dL (ref 0.50–1.10)
GFR calc Af Amer: 86 mL/min — ABNORMAL LOW (ref >90)
GFR calc non Af Amer: 74 mL/min — ABNORMAL LOW (ref >90)
Glucose, Bld: 137 mg/dL — ABNORMAL HIGH (ref 70–99)
Potassium: 3.4 mEq/L — ABNORMAL LOW (ref 3.5–5.1)
Sodium: 143 mEq/L (ref 135–145)
Total Bilirubin: 0.4 mg/dL (ref 0.3–1.2)
Total Protein: 7.2 g/dL (ref 6.0–8.3)

## 2012-10-03 LAB — URINALYSIS, ROUTINE W REFLEX MICROSCOPIC
Bilirubin Urine: NEGATIVE
Glucose, UA: NEGATIVE mg/dL
Specific Gravity, Urine: 1.025 (ref 1.005–1.030)
pH: 6 (ref 5.0–8.0)

## 2012-10-03 LAB — TROPONIN I: Troponin I: <0.3 ng/mL (ref <0.30)

## 2012-10-03 LAB — LIPASE, BLOOD: Lipase: 25 U/L (ref 11–59)

## 2012-10-03 LAB — LACTIC ACID, PLASMA: Lactic Acid, Venous: 1.4 mmol/L (ref 0.5–2.2)

## 2012-10-03 MED ORDER — DIPHENHYDRAMINE HCL 25 MG PO CAPS: 50.0000 mg | ORAL_CAPSULE | Freq: Once | ORAL | Status: DC

## 2012-10-03 MED ORDER — SODIUM CHLORIDE 0.9 % IV SOLN
INTRAVENOUS | Status: DC
  Administered 2012-10-03: 13:00:00 via INTRAVENOUS

## 2012-10-03 MED ORDER — CIPROFLOXACIN HCL 500 MG PO TABS: 500.0000 mg | ORAL_TABLET | Freq: Two times a day (BID) | ORAL | Status: DC

## 2012-10-03 MED ORDER — ONDANSETRON HCL 4 MG/2ML IJ SOLN
4.0000 mg | INTRAMUSCULAR | Status: DC | PRN
  Administered 2012-10-03: 4 mg via INTRAVENOUS
  Filled 2012-10-03: qty 2

## 2012-10-03 MED ORDER — IOHEXOL 300 MG/ML  SOLN
100.0000 mL | Freq: Once | INTRAMUSCULAR | Status: AC | PRN
  Administered 2012-10-03: 100 mL via INTRAVENOUS

## 2012-10-03 MED ORDER — DIPHENHYDRAMINE HCL 25 MG PO CAPS
ORAL_CAPSULE | ORAL | Status: AC
  Administered 2012-10-03: 25 mg via ORAL
  Filled 2012-10-03: qty 1

## 2012-10-03 MED ORDER — CIPROFLOXACIN IN D5W 400 MG/200ML IV SOLN
400.0000 mg | Freq: Once | INTRAVENOUS | Status: DC
  Filled 2012-10-03: qty 200

## 2012-10-03 MED ORDER — DIPHENHYDRAMINE HCL 25 MG PO CAPS: 25.0000 mg | ORAL_CAPSULE | Freq: Once | ORAL | Status: AC

## 2012-10-03 MED ORDER — IOHEXOL 300 MG/ML  SOLN
50.0000 mL | Freq: Once | INTRAMUSCULAR | Status: AC | PRN
  Administered 2012-10-03: 50 mL via ORAL

## 2012-10-03 NOTE — ED Notes (Signed)
Pt reports painful urination for 2 days, +nausea, no fever.  Slight low back pain

## 2012-10-03 NOTE — ED Provider Notes (Signed)
History     CSN: [id number]  Arrival date & time 10/03/12  1102   First MD Initiated Contact with Patient 10/03/12 1125      Chief Complaint  Patient presents with  . Urinary Tract Infection     HPI Pt was seen at 1140.   Per pt, c/o gradual onset and persistence of constant dysuria for the past 2 days. Has been associated with nausea and mild bilat lower back "pain."  Denies vomiting/diarrhea, no flank pain, no fevers, no CP/SOB, no rash, no vaginal bleeding/discharge.     Past Medical History  Diagnosis Date  . Hypertension   . Atrial fibrillation   . Cancer     left side  . GERD (gastroesophageal reflux disease)   . Hyperlipidemia   . Osteoporosis   . Osteopenia   . Edema   . Chronic back pain   . Abdominal pain, chronic, right lower quadrant     "ever since I had my appendix removed"    Past Surgical History  Procedure Laterality Date  . Breast surgery    . Appendectomy    . Hernia repair    . Colon surgery      Family History  Problem Relation Age of Onset  . Heart disease    . Arthritis    . Cancer    . Diabetes Mother   . Hyperlipidemia Brother     History  Substance Use Topics  . Smoking status: Never Smoker   . Smokeless tobacco: Not on file  . Alcohol Use: No    OB History   Grav Para Term Preterm Abortions TAB SAB Ect Mult Living   2 1   1  1   1       Review of Systems ROS: Statement: All systems negative except as marked or noted in the HPI; Constitutional: Negative for fever and chills. ; ; Eyes: Negative for eye pain, redness and discharge. ; ; ENMT: Negative for ear pain, hoarseness, nasal congestion, sinus pressure and sore throat. ; ; Cardiovascular: Negative for chest pain, palpitations, diaphoresis, dyspnea and peripheral edema. ; ; Respiratory: Negative for cough, wheezing and stridor. ; ; Gastrointestinal: +nausea. Negative for vomiting, diarrhea, abdominal pain, blood in stool, hematemesis, jaundice and rectal bleeding. . ; ;  Genitourinary: +dysuria. Negative for flank pain and hematuria. ; ; GYN:  No vaginal bleeding, no vaginal discharge, no vulvar pain.;; Musculoskeletal: +low back pain. Negative for neck pain. Negative for swelling and trauma.; ; Skin: Negative for pruritus, rash, abrasions, blisters, bruising and skin lesion.; ; Neuro: Negative for headache, lightheadedness and neck stiffness. Negative for weakness, altered level of consciousness , altered mental status, extremity weakness, paresthesias, involuntary movement, seizure and syncope.       Allergies  Amoxicillin; Cephalosporins; Hydrocodone; Lipitor; Lovenox; Morphine and related; Penicillins; and Sulfa antibiotics  Home Medications   Current Outpatient Rx  Name  Route  Sig  Dispense  Refill  . ALPRAZolam (XANAX) 0.5 MG tablet   Oral   Take 0.5 mg by mouth at bedtime.           Marland Kitchen diltiazem (CARDIZEM) 60 MG tablet   Oral   Take 60 mg by mouth daily.         . metoprolol succinate (TOPROL-XL) 50 MG 24 hr tablet   Oral   Take 1 tablet (50 mg total) by mouth daily.   30 tablet   1   . omeprazole (PRILOSEC) 20 MG capsule   Oral  Take 20 mg by mouth daily.          . Rivaroxaban (XARELTO) 15 MG TABS tablet   Oral   Take 15 mg by mouth daily.         . traMADol (ULTRAM) 50 MG tablet   Oral   Take 50 mg by mouth daily. Pain           BP 143/88  Pulse 104  Temp(Src) 98.1 F (36.7 C) (Oral)  Resp 18  Ht 5\' 4"  (1.626 m)  Wt 155 lb (70.308 kg)  BMI 26.59 kg/m2  SpO2 95%  Physical Exam 1145: Physical examination:  Nursing notes reviewed; Vital signs and O2 SAT reviewed;  Constitutional: Well developed, Well nourished, Well hydrated, In no acute distress; Head:  Normocephalic, atraumatic; Eyes: EOMI, PERRL, No scleral icterus; ENMT: Mouth and pharynx normal, Mucous membranes moist; Neck: Supple, Full range of motion, No lymphadenopathy; Cardiovascular: Irregular irregular rate and rhythm, No murmur, rub, or gallop;  Respiratory: Breath sounds clear & equal bilaterally, No rales, rhonchi, wheezes.  Speaking full sentences with ease, Normal respiratory effort/excursion; Chest: Nontender, Movement normal; Abdomen: Soft, +mild RLQ and RUQ tenderness to palp. No rebound or guarding. Nondistended, Normal bowel sounds; Genitourinary: No CVA tenderness; Spine:  No midline CS, TS, LS tenderness. +mild bilateral lower lumbar paraspinal muscles TTP.;; Extremities: Pulses normal, No tenderness, No edema, No calf edema or asymmetry.; Neuro: AA&Ox3, Major CN grossly intact.  Speech clear. No gross focal motor or sensory deficits in extremities.; Skin: Color normal, Warm, Dry.   ED Course  Procedures     MDM  MDM Reviewed: previous chart, nursing note and vitals Reviewed previous: ECG and labs Interpretation: ECG, labs, CT scan and x-ray    Date: 10/03/2012  Rate: 101  Rhythm: atrial fibrillation  QRS Axis: normal  Intervals: normal  ST/T Wave abnormalities: normal  Conduction Disutrbances:none  Narrative Interpretation:   Old EKG Reviewed: unchanged; no significant changes from previous EKG dated 01/29/2012.  Results for orders placed during the hospital encounter of 10/03/12  URINALYSIS, ROUTINE W REFLEX MICROSCOPIC      Result Value Range   Color, Urine YELLOW  YELLOW   APPearance CLEAR  CLEAR   Specific Gravity, Urine 1.025  1.005 - 1.030   pH 6.0  5.0 - 8.0   Glucose, UA NEGATIVE  NEGATIVE mg/dL   Hgb urine dipstick SMALL (*) NEGATIVE   Bilirubin Urine NEGATIVE  NEGATIVE   Ketones, ur TRACE (*) NEGATIVE mg/dL   Protein, ur NEGATIVE  NEGATIVE mg/dL   Urobilinogen, UA 0.2  0.0 - 1.0 mg/dL   Nitrite POSITIVE (*) NEGATIVE   Leukocytes, UA MODERATE (*) NEGATIVE  CBC WITH DIFFERENTIAL      Result Value Range   WBC 9.3  4.0 - 10.5 K/uL   RBC 4.96  3.87 - 5.11 MIL/uL   Hemoglobin 14.5  12.0 - 15.0 g/dL   HCT 16.1  09.6 - 04.5 %   MCV 84.1  78.0 - 100.0 fL   MCH 29.2  26.0 - 34.0 pg   MCHC 34.8  30.0  - 36.0 g/dL   RDW 40.9  81.1 - 91.4 %   Platelets 283  150 - 400 K/uL   Neutrophils Relative % 68  43 - 77 %   Neutro Abs 6.3  1.7 - 7.7 K/uL   Lymphocytes Relative 22  12 - 46 %   Lymphs Abs 2.1  0.7 - 4.0 K/uL   Monocytes Relative 9  3 -  12 %   Monocytes Absolute 0.9  0.1 - 1.0 K/uL   Eosinophils Relative 0  0 - 5 %   Eosinophils Absolute 0.0  0.0 - 0.7 K/uL   Basophils Relative 0  0 - 1 %   Basophils Absolute 0.0  0.0 - 0.1 K/uL  COMPREHENSIVE METABOLIC PANEL      Result Value Range   Sodium 143  135 - 145 mEq/L   Potassium 3.4 (*) 3.5 - 5.1 mEq/L   Chloride 107  96 - 112 mEq/L   CO2 26  19 - 32 mEq/L   Glucose, Bld 137 (*) 70 - 99 mg/dL   BUN 15  6 - 23 mg/dL   Creatinine, Ser 0.98  0.50 - 1.10 mg/dL   Calcium 9.5  8.4 - 11.9 mg/dL   Total Protein 7.2  6.0 - 8.3 g/dL   Albumin 3.6  3.5 - 5.2 g/dL   AST 21  0 - 37 U/L   ALT 13  0 - 35 U/L   Alkaline Phosphatase 68  39 - 117 U/L   Total Bilirubin 0.4  0.3 - 1.2 mg/dL   GFR calc non Af Amer 74 (*) >90 mL/min   GFR calc Af Amer 86 (*) >90 mL/min  LIPASE, BLOOD      Result Value Range   Lipase 25  11 - 59 U/L  TROPONIN I      Result Value Range   Troponin I <0.30  <0.30 ng/mL  LACTIC ACID, PLASMA      Result Value Range   Lactic Acid, Venous 1.4  0.5 - 2.2 mmol/L  URINE MICROSCOPIC-ADD ON      Result Value Range   WBC, UA TOO NUMEROUS TO COUNT  <3 WBC/hpf   RBC / HPF TOO NUMEROUS TO COUNT  <3 RBC/hpf   Bacteria, UA MANY (*) RARE   Dg Chest 2 View 10/03/2012   *RADIOLOGY REPORT*  Clinical Data: Abdominal pain.  Hypertension.  Atrial fibrillation.  CHEST - 2 VIEW  Comparison: 02/18/2012  Findings: Midline trachea.  Mild cardiomegaly. No pleural effusion or pneumothorax.  No congestive failure.  Mild left base scarring which is not significantly changed.  IMPRESSION: Cardiomegaly and left base scarring. No acute findings.   Original Report Authenticated By: Jeronimo Greaves, M.D.   Ct Abdomen Pelvis W Contrast 10/03/2012    *RADIOLOGY REPORT*  Clinical Data: Dysuria.  Right side abdominal pain.  Nausea.  Left breast cancer.  Partial colectomy.  Appendectomy.  Hernia surgery times three.  Hypertension.  Atrial fibrillation.  CT ABDOMEN AND PELVIS WITH CONTRAST  Technique:  Multidetector CT imaging of the abdomen and pelvis was performed following the standard protocol during bolus administration of intravenous contrast.  Contrast: 50mL OMNIPAQUE IOHEXOL 300 MG/ML  SOLN, OMNIPAQUE IOHEXOL 300 MG/ML  SOLN  Comparison: Abdominal MRI 01/12/11.  Most recent CT of 04/29/2006.  Findings: Lung bases:  Mild bibasilar atelectasis or scarring. Cardiomegaly with coronary artery atherosclerosis. No pericardial or pleural effusion.  Abdomen/pelvis:  Possible mild hepatic steatosis. No focal liver lesion.  Normal spleen, stomach.  Large descending duodenal diverticulum.  Normal pancreas, gallbladder, biliary tract, adrenal glands, kidneys.  Aortic and branch vessel atherosclerosis. No retroperitoneal or retrocrural adenopathy.  Extensive colonic diverticulosis, without evidence of diverticulitis.  Underdistended portions of the splenic flexure of the colon. Normal terminal ileum.  A low pelvic ventral wall hernia contains nonobstructive small bowel on image 73/series 2.  No evidence of strangulation.  No obstruction.  Mildly  prominent right inguinal nodes which are chronic and reactive.  Suspicion of mild pericystic edema on image 78/series 2. Normal uterus and adnexa, without significant free pelvic fluid.  Bones/Musculoskeletal:  Right-sided Tarlov cyst.  Mild osteopenia. Transitional S1 vertebral body with degenerative disc disease at the L5-S1 level.  IMPRESSION:  1.  Subtle pericystic edema suspected.  Question cystitis. 2.  Nonobstructive small bowel containing low pelvic ventral wall hernia.   Original Report Authenticated By: Jeronimo Greaves, M.D.    1530:  +UTI.  Pt with multiple drug allergies, will dose IV cipro here and rx same while  UC pending. WBC and lactic acid normal. Pt remains NAD, resps easy, VS stable, afebrile. Has tol PO well without N/V.  States she wants to go home now. Dx and testing d/w pt.  Questions answered.  Verb understanding, agreeable to d/c home with outpt f/u.             Laray Anger, DO 10/06/12 1115

## 2012-10-03 NOTE — ED Notes (Signed)
Pt not ready for d/c. Antibiotic infusing.

## 2012-10-03 NOTE — ED Notes (Addendum)
Pt reports left upper arm itching. nad noted. Cipro stopped. NS bolused x2 minutes. EDP aware and gave verbal order for 25mg  of benadryl and to slow drip rate down. Pt tolerated well.

## 2012-10-05 ENCOUNTER — Encounter: Payer: Self-pay | Admitting: Family Medicine

## 2012-10-05 ENCOUNTER — Ambulatory Visit (INDEPENDENT_AMBULATORY_CARE_PROVIDER_SITE_OTHER): Payer: Medicare Other | Admitting: Family Medicine

## 2012-10-05 VITALS — BP 126/80 | Temp 98.2°F | Wt 163.0 lb

## 2012-10-05 DIAGNOSIS — N390 Urinary tract infection, site not specified: Secondary | ICD-10-CM

## 2012-10-05 LAB — POCT URINALYSIS DIPSTICK

## 2012-10-05 NOTE — Progress Notes (Signed)
  Subjective:    Patient ID: [name], female    DOB: [birth year], 79 y.o.   MRN: [id number]  HPI Patient with dysuria urinary free to see him back pain discomfort low-grade fever some abdominal pain nausea no vomiting wheezing or difficulty breathing PMH benign went to the ER diagnosed with UTI put on antibiotics is doing a little better now denies any severe pain or abdominal pain. PMH benign   Review of SystemsSee per above     Objective:   Physical Exam Vital signs [name]. Lungs clear heart regular abdomen soft flanks nontender       Assessment & Plan:  UTI- cipro bid 2 weeks Fu 2 weeks check urine then Warning signs were discussed if high fever return of abdominal pain followup sooner.

## 2012-10-05 NOTE — Patient Instructions (Signed)
If fevers/ vomiting or severe pain call or go to er

## 2012-10-06 LAB — URINE CULTURE: Colony Count: >=100000

## 2012-10-07 ENCOUNTER — Telehealth (HOSPITAL_COMMUNITY): Payer: Self-pay | Admitting: Emergency Medicine

## 2012-10-07 NOTE — ED Notes (Signed)
Post ED Visit - Positive Culture Follow-up  Culture report reviewed by antimicrobial stewardship pharmacist: []  Wes Dulaney, Pharm.D., BCPS []  Celedonio Miyamoto, Pharm.D., BCPS []  Georgina Pillion, Pharm.D., BCPS []  [place] , [address].D., BCPS, AAHIVP []  Estella Husk, Pharm.D., BCPS, AAHIV [x]  Laurence Slate, [address].D.  Positive urine culture Treated with Cipro, organism sensitive to the same and no further patient follow-up is required at this time.  Kylie A Holland 10/07/2012, 4:22 PM

## 2012-10-18 ENCOUNTER — Ambulatory Visit: Payer: Medicare Other | Admitting: Family Medicine

## 2012-10-22 ENCOUNTER — Ambulatory Visit (INDEPENDENT_AMBULATORY_CARE_PROVIDER_SITE_OTHER): Payer: Medicare Other | Admitting: Family Medicine

## 2012-10-22 ENCOUNTER — Encounter: Payer: Self-pay | Admitting: Family Medicine

## 2012-10-22 VITALS — BP 128/79 | Temp 98.3°F | Wt 160.4 lb

## 2012-10-22 DIAGNOSIS — R300 Dysuria: Secondary | ICD-10-CM

## 2012-10-22 LAB — POCT URINALYSIS DIPSTICK

## 2012-10-22 MED ORDER — CIPROFLOXACIN HCL 250 MG PO TABS: 250.0000 mg | ORAL_TABLET | Freq: Two times a day (BID) | ORAL | Status: AC

## 2012-10-22 NOTE — Progress Notes (Deleted)
  Subjective:    Patient ID: [name] Stable, female    DOB: 04-26-1924, 79 y.o.   MRN: [id number]  HPI    Review of Systems     Objective:   Physical Exam        Assessment & Plan:

## 2012-10-22 NOTE — Progress Notes (Signed)
  Subjective:    Patient ID: [name], female    DOB: Oct 07, 1923, 79 y.o.   MRN: [id number]  Urinary Tract Infection  This is a new problem. The current episode started in the past 7 days. The problem occurs intermittently. The problem has been gradually improving. There has been no fever. Associated symptoms comments: Patient having back pain. She thinks it's due to Xarelto. She has tried antibiotics for the symptoms. The treatment provided mild relief.  Back Pain This is a chronic problem. The current episode started more than 1 month ago. The problem occurs constantly. The problem is unchanged. The pain is present in the lumbar spine. The pain does not radiate. The pain is at a severity of 7/10. The pain is severe. The pain is the same all the time. The symptoms are aggravated by standing and bending. Stiffness is present all day. Pertinent negatives include no bladder incontinence, dysuria, fever, leg pain, numbness, paresis or paresthesias. She has tried nothing for the symptoms. The treatment provided no relief.      Review of Systems  Constitutional: Negative for fever.  Genitourinary: Negative for bladder incontinence and dysuria.  Musculoskeletal: Positive for back pain.  Neurological: Negative for numbness and paresthesias.       Objective:   Physical Exam  Constitutional: She is oriented to person, place, and time. She appears well-developed.  HENT:  Head: Normocephalic and atraumatic.  Neck: Normal range of motion.  Cardiovascular: Normal rate and normal heart sounds.   No murmur heard. Pulmonary/Chest: Effort normal and breath sounds normal. No respiratory distress. She has no wheezes.  Abdominal: Soft.  Musculoskeletal: She exhibits no edema.  Lymphadenopathy:    She has no cervical adenopathy.  Neurological: She is alert and oriented to person, place, and time.  Skin: Skin is warm and dry.          Assessment & Plan:  #1 back pain-it could be  musculoskeletal she feels it's related to her urinary tract infection. She will be following up in several weeks if it is still ongoing she will need some tests #2 persistent UTI reculture the urine. Cipro 250 mg twice a day for 2 weeks, followup if high fevers or worse otherwise see back in a few weeks time check urine at that visit. #3 I doubt that this is related at all to her blood thinner. She also states her cardiologist is moving in [place] and she can no longer go there she will followup here in if she needs to see a cardiologist she would like to have one who is local. Currently right now does not need to see her cardiologist.

## 2012-10-22 NOTE — Progress Notes (Deleted)
  Subjective:    Patient ID: [name], female    DOB: 11/21/1923, 79 y.o.   MRN: [id number]  HPI    Review of Systems     Objective:   Physical Exam        Assessment & Plan:   

## 2012-10-22 NOTE — Progress Notes (Deleted)
  Subjective:    Patient ID: [name], female    DOB: 17-Jul-1923, 79 y.o.   MRN: [id number]  Back Pain      Review of Systems  Musculoskeletal: Positive for back pain.       Objective:   Physical Exam        Assessment & Plan:

## 2012-10-24 LAB — URINE CULTURE: Colony Count: >=100000

## 2012-10-31 ENCOUNTER — Ambulatory Visit: Payer: Medicare Other | Admitting: Cardiovascular Disease

## 2012-11-12 ENCOUNTER — Ambulatory Visit (INDEPENDENT_AMBULATORY_CARE_PROVIDER_SITE_OTHER): Payer: Medicare Other | Admitting: Family Medicine

## 2012-11-12 ENCOUNTER — Other Ambulatory Visit (HOSPITAL_COMMUNITY): Payer: Self-pay | Admitting: Cardiovascular Disease

## 2012-11-12 ENCOUNTER — Encounter: Payer: Self-pay | Admitting: Family Medicine

## 2012-11-12 VITALS — BP 144/68 | HR 70 | Ht 64.0 in | Wt 159.4 lb

## 2012-11-12 DIAGNOSIS — R300 Dysuria: Secondary | ICD-10-CM

## 2012-11-12 LAB — POCT URINALYSIS DIPSTICK: pH, UA: 5

## 2012-11-12 MED ORDER — ESTRADIOL 0.1 MG/GM VA CREA: TOPICAL_CREAM | VAGINAL | Status: DC

## 2012-11-12 NOTE — Progress Notes (Signed)
  Subjective:    Patient ID: [name] Stable, female    DOB: 1923-12-22, 79 y.o.   MRN: [id number]  Urinary Tract Infection  This is a recurrent problem. The current episode started 1 to 4 weeks ago. The problem occurs intermittently. The problem has been unchanged. The quality of the pain is described as burning. The pain is at a severity of 7/10. The pain is moderate. There has been no fever. Associated symptoms comments: Back pain. She has tried antibiotics for the symptoms. The treatment provided mild relief.   This patient relates a lot of back stiffness and discomfort worse with certain movements denies any other injury. I talked with her at length about this I did not feel it is due to urinary symptoms. She had a CAT scan done in the hospital which showed some arthritic changes in her back she is not having any severe arthralgias or numbness down the leg.  PMH frequent UTIs social lives by herself doesn't smoke no fever chills sweats no bowel pain or flank pain no cough or wheeze   Review of Systems     Objective:   Physical Exam  Lungs are clear flank nontender heart rate controlled abdomen soft lumbar subjective discomfort legs normal history of recent UTI-urinalysis with some wbc's we will culture the urine       Assessment & Plan:  UTI-urinalysis with some wbc's/culture urine Lumbar pain-Tylenol when necessary arthralgias does not wake her up at night no need for MRI

## 2012-11-13 ENCOUNTER — Other Ambulatory Visit: Payer: Self-pay | Admitting: *Deleted

## 2012-11-13 MED ORDER — METOPROLOL SUCCINATE ER 50 MG PO TB24: 50.0000 mg | ORAL_TABLET | Freq: Every day | ORAL | Status: DC

## 2012-11-13 NOTE — Telephone Encounter (Signed)
Metoprolol ER refilled electronically 

## 2012-11-13 NOTE — Telephone Encounter (Signed)
Rx was sent to pharmacy electronically. 

## 2012-11-14 ENCOUNTER — Other Ambulatory Visit: Payer: Self-pay | Admitting: *Deleted

## 2012-11-14 MED ORDER — NITROFURANTOIN MACROCRYSTAL 100 MG PO CAPS: 100.0000 mg | ORAL_CAPSULE | Freq: Two times a day (BID) | ORAL | Status: DC

## 2012-11-15 LAB — URINE CULTURE

## 2012-12-13 ENCOUNTER — Ambulatory Visit (INDEPENDENT_AMBULATORY_CARE_PROVIDER_SITE_OTHER): Payer: Medicare Other | Admitting: Family Medicine

## 2012-12-13 ENCOUNTER — Encounter: Payer: Self-pay | Admitting: Family Medicine

## 2012-12-13 VITALS — BP 118/76 | Wt 160.0 lb

## 2012-12-13 DIAGNOSIS — R300 Dysuria: Secondary | ICD-10-CM

## 2012-12-13 DIAGNOSIS — R5381 Other malaise: Secondary | ICD-10-CM

## 2012-12-13 DIAGNOSIS — R5383 Other fatigue: Secondary | ICD-10-CM

## 2012-12-13 DIAGNOSIS — R7309 Other abnormal glucose: Secondary | ICD-10-CM

## 2012-12-13 DIAGNOSIS — R7303 Prediabetes: Secondary | ICD-10-CM

## 2012-12-13 DIAGNOSIS — E876 Hypokalemia: Secondary | ICD-10-CM

## 2012-12-13 LAB — POCT URINALYSIS DIPSTICK: Spec Grav, UA: 1.02

## 2012-12-13 NOTE — Progress Notes (Signed)
  Subjective:    Patient ID: [name], female    DOB: 05/18/1924, 79 y.o.   MRN: [id number]  HPI Patient arrives for follow up on a UTI. Patient has finished her antibiotics. Long discussion held with patient regarding frequent UTIs she does not drink a lot of fluids. She probably has some level of urinary retention and low urine flow. She does relate that she is not experiencing any abdominal pain burning vomiting diarrhea or flank pain currently. PMH is benign. She does have a history of heart disease she is on Coumadin. She also relates a lot of fatigue and tiredness that is felt to be somewhat related to her age. Her appetite is good she is not losing weight. Medication list was reviewed. Patient does not smoke. Family history noncontributory. Past medical history reflux atrial fibrillation long-term anticoagulation some level of anxiety.   Review of Systems See above    Objective:   Physical Exam Her lungs are clear there is no crackles heart is ir- regular, heart rate is controlled, flanks nontender abdomen is soft extremities no edema skin warm dry   Urinalysis shows WBCs    Assessment & Plan:  Probable UTI check urine culture await result may need further antibiotics Fatigue tiredness we'll check some lab work await the results. I think this could be age related patient followup 3 months

## 2012-12-14 LAB — BASIC METABOLIC PANEL
Chloride: 105 mEq/L (ref 96–112)
Creat: 0.78 mg/dL (ref 0.50–1.10)

## 2012-12-14 LAB — TSH: TSH: 3.141 u[IU]/mL (ref 0.350–4.500)

## 2012-12-14 LAB — MAGNESIUM: Magnesium: 2 mg/dL (ref 1.5–2.5)

## 2012-12-14 LAB — HEMOGLOBIN A1C: Hgb A1c MFr Bld: 6 % — ABNORMAL HIGH (ref <5.7)

## 2012-12-15 LAB — URINE CULTURE: Colony Count: >=100000

## 2012-12-18 ENCOUNTER — Other Ambulatory Visit: Payer: Self-pay | Admitting: Family Medicine

## 2012-12-18 ENCOUNTER — Other Ambulatory Visit: Payer: Self-pay

## 2012-12-18 MED ORDER — CIPROFLOXACIN HCL 250 MG PO TABS: 250.0000 mg | ORAL_TABLET | Freq: Two times a day (BID) | ORAL | Status: AC

## 2012-12-20 ENCOUNTER — Encounter: Payer: Self-pay | Admitting: Family Medicine

## 2012-12-20 ENCOUNTER — Ambulatory Visit (INDEPENDENT_AMBULATORY_CARE_PROVIDER_SITE_OTHER): Payer: Medicare Other | Admitting: Family Medicine

## 2012-12-20 VITALS — BP 148/72 | HR 70 | Wt 157.8 lb

## 2012-12-20 DIAGNOSIS — IMO0001 Reserved for inherently not codable concepts without codable children: Secondary | ICD-10-CM

## 2012-12-20 DIAGNOSIS — R300 Dysuria: Secondary | ICD-10-CM

## 2012-12-20 DIAGNOSIS — M791 Myalgia, unspecified site: Secondary | ICD-10-CM

## 2012-12-20 LAB — POCT URINALYSIS DIPSTICK
Spec Grav, UA: 1.01
pH, UA: 7

## 2012-12-20 MED ORDER — CITALOPRAM HYDROBROMIDE 20 MG PO TABS: 20.0000 mg | ORAL_TABLET | Freq: Every day | ORAL | Status: DC

## 2012-12-20 NOTE — Progress Notes (Signed)
  Subjective:    Patient ID: [name], female    DOB: [birth year], 79 y.o.   MRN: [id number]  HPI Patient states that she has been feeling fatigue, having body aches, and bottom of feet have been burning This patient relates that she's been having a lot of fatigue tiredness muscle soreness just not feeling good low energy. She's had numerous tests so far nothing is really shown other than repetitive urinary tract infections. She has had a scan in the past. She has had x-ray in the past. She has not been losing weight no night sweats no vomiting diarrhea no bloody stools. PMH-she does have fair amount of arthritis and frequent urinary tract infections and heart disease. She denies any angina symptoms currently she does not smoke  Review of Systems See above    Objective:   Physical Exam Her lungs cleared is no crackle heart rate is controlled pulses normal blood pressure is good extremities no edema skin warm dry there is some soreness in her upper muscles and in her hips       Assessment & Plan:  We will go ahead and check a sedimentation rate to rule out the possibility of polymyalgia rheumatica. In addition to this I do believe there is some depression going on Celexa 20 mg she will take half tablet daily followup in 3 weeks await the results of all these tests

## 2012-12-21 ENCOUNTER — Encounter: Payer: Self-pay | Admitting: Family Medicine

## 2012-12-25 ENCOUNTER — Other Ambulatory Visit: Payer: Self-pay | Admitting: Family Medicine

## 2012-12-25 NOTE — Telephone Encounter (Signed)
Ok times 4 

## 2012-12-27 ENCOUNTER — Other Ambulatory Visit: Payer: Self-pay | Admitting: Family Medicine

## 2012-12-27 NOTE — Telephone Encounter (Signed)
Ok times 4 

## 2012-12-28 NOTE — Telephone Encounter (Signed)
RX called into pharmacy

## 2013-01-10 ENCOUNTER — Ambulatory Visit: Payer: Medicare Other | Admitting: Family Medicine

## 2013-01-11 ENCOUNTER — Encounter: Payer: Self-pay | Admitting: Family Medicine

## 2013-01-11 ENCOUNTER — Ambulatory Visit (INDEPENDENT_AMBULATORY_CARE_PROVIDER_SITE_OTHER): Payer: Medicare Other | Admitting: Family Medicine

## 2013-01-11 VITALS — BP 148/90 | Ht 64.0 in | Wt 159.2 lb

## 2013-01-11 DIAGNOSIS — G589 Mononeuropathy, unspecified: Secondary | ICD-10-CM

## 2013-01-11 DIAGNOSIS — G629 Polyneuropathy, unspecified: Secondary | ICD-10-CM

## 2013-01-11 DIAGNOSIS — R7303 Prediabetes: Secondary | ICD-10-CM

## 2013-01-11 DIAGNOSIS — F419 Anxiety disorder, unspecified: Secondary | ICD-10-CM

## 2013-01-11 DIAGNOSIS — R7309 Other abnormal glucose: Secondary | ICD-10-CM

## 2013-01-11 DIAGNOSIS — F411 Generalized anxiety disorder: Secondary | ICD-10-CM

## 2013-01-11 DIAGNOSIS — R300 Dysuria: Secondary | ICD-10-CM

## 2013-01-11 MED ORDER — GABAPENTIN 100 MG PO CAPS: ORAL_CAPSULE | ORAL | Status: DC

## 2013-01-11 NOTE — Progress Notes (Signed)
  Subjective:    Patient ID: [name], female    DOB: 1923-10-15, 79 y.o.   MRN: [id number]  HPI Patient here for an follow up on dysuria she says it is better but now has complaints of back, leg, and feet pain This patient also relates that she is having some improvement in her overall energy level since starting the Celexa she feels she is getting along with that well. She denies any negative side effects She also relates burning in the feet bilateral this been going on for months she does have mild prediabetes. She states it does wake her up several times at night she would like to try medication she's not interested in any testing Past medical history pre-but prediabetes  Review of Systems See above no chest tightness pressure pain shortness breath nausea vomiting or swelling    Objective:   Physical Exam We'll lungs are clear heart trigger pulse normal blood pressure good foot exam is normal pulses are normal       Assessment & Plan:  Prediabetes decent control watch diet closely Peripheral neuropathy-try Neurontin 100 mg twice daily recommended the patient followup in 4 weeks to see this is doing if it is causing any problems he is to stop it the first week she is just to take the Neurontin once at night Fatigue and tiredness possible early depression seems to be responding to Celexa continue this as is. Followup 4 weeks Patient's urinary tract symptoms have resolved

## 2013-02-04 ENCOUNTER — Ambulatory Visit: Payer: Self-pay | Admitting: Pharmacist Clinician (PhC)/ Clinical Pharmacy Specialist

## 2013-02-04 DIAGNOSIS — Z7901 Long term (current) use of anticoagulants: Secondary | ICD-10-CM

## 2013-02-04 DIAGNOSIS — I4891 Unspecified atrial fibrillation: Secondary | ICD-10-CM

## 2013-02-12 ENCOUNTER — Emergency Department (HOSPITAL_COMMUNITY): Payer: Medicare Other

## 2013-02-12 ENCOUNTER — Encounter (HOSPITAL_COMMUNITY): Payer: Self-pay

## 2013-02-12 ENCOUNTER — Emergency Department (HOSPITAL_COMMUNITY)
Admission: EM | Admit: 2013-02-12 | Discharge: 2013-02-12 | Disposition: A | Discharge status: Home / Self Care | Payer: Medicare Other | Attending: Emergency Medicine | Admitting: Emergency Medicine

## 2013-02-12 DIAGNOSIS — Z79899 Other long term (current) drug therapy: Secondary | ICD-10-CM | POA: Insufficient documentation

## 2013-02-12 DIAGNOSIS — S52531A Colles' fracture of right radius, initial encounter for closed fracture: Secondary | ICD-10-CM

## 2013-02-12 DIAGNOSIS — R296 Repeated falls: Secondary | ICD-10-CM | POA: Insufficient documentation

## 2013-02-12 DIAGNOSIS — G8929 Other chronic pain: Secondary | ICD-10-CM | POA: Insufficient documentation

## 2013-02-12 DIAGNOSIS — Y9389 Activity, other specified: Secondary | ICD-10-CM | POA: Insufficient documentation

## 2013-02-12 DIAGNOSIS — Z8739 Personal history of other diseases of the musculoskeletal system and connective tissue: Secondary | ICD-10-CM | POA: Insufficient documentation

## 2013-02-12 DIAGNOSIS — S0003XA Contusion of scalp, initial encounter: Secondary | ICD-10-CM | POA: Insufficient documentation

## 2013-02-12 DIAGNOSIS — Z8639 Personal history of other endocrine, nutritional and metabolic disease: Secondary | ICD-10-CM | POA: Insufficient documentation

## 2013-02-12 DIAGNOSIS — Z859 Personal history of malignant neoplasm, unspecified: Secondary | ICD-10-CM | POA: Insufficient documentation

## 2013-02-12 DIAGNOSIS — S0083XA Contusion of other part of head, initial encounter: Secondary | ICD-10-CM

## 2013-02-12 DIAGNOSIS — Z862 Personal history of diseases of the blood and blood-forming organs and certain disorders involving the immune mechanism: Secondary | ICD-10-CM | POA: Insufficient documentation

## 2013-02-12 DIAGNOSIS — K219 Gastro-esophageal reflux disease without esophagitis: Secondary | ICD-10-CM | POA: Insufficient documentation

## 2013-02-12 DIAGNOSIS — Z880 Allergy status to penicillin: Secondary | ICD-10-CM | POA: Insufficient documentation

## 2013-02-12 DIAGNOSIS — IMO0002 Reserved for concepts with insufficient information to code with codable children: Secondary | ICD-10-CM | POA: Insufficient documentation

## 2013-02-12 DIAGNOSIS — Y929 Unspecified place or not applicable: Secondary | ICD-10-CM | POA: Insufficient documentation

## 2013-02-12 DIAGNOSIS — I10 Essential (primary) hypertension: Secondary | ICD-10-CM | POA: Insufficient documentation

## 2013-02-12 DIAGNOSIS — S52539A Colles' fracture of unspecified radius, initial encounter for closed fracture: Secondary | ICD-10-CM | POA: Insufficient documentation

## 2013-02-12 MED ORDER — OXYCODONE-ACETAMINOPHEN 5-325 MG PO TABS: 1.0000 | ORAL_TABLET | ORAL | Status: DC | PRN

## 2013-02-12 MED ORDER — LORAZEPAM 2 MG/ML IJ SOLN
1.0000 mg | Freq: Once | INTRAMUSCULAR | Status: AC
  Administered 2013-02-12: 1 mg via INTRAVENOUS
  Filled 2013-02-12: qty 1

## 2013-02-12 MED ORDER — SODIUM CHLORIDE 0.9 % IV BOLUS (SEPSIS)
500.0000 mL | Freq: Once | INTRAVENOUS | Status: AC
  Administered 2013-02-12: 500 mL via INTRAVENOUS

## 2013-02-12 MED ORDER — HYDROMORPHONE HCL PF 1 MG/ML IJ SOLN
0.5000 mg | Freq: Once | INTRAMUSCULAR | Status: AC
  Administered 2013-02-12: 0.5 mg via INTRAVENOUS
  Filled 2013-02-12: qty 1

## 2013-02-12 MED ORDER — ONDANSETRON HCL 4 MG/2ML IJ SOLN
4.0000 mg | Freq: Once | INTRAMUSCULAR | Status: AC
  Administered 2013-02-12: 4 mg via INTRAVENOUS
  Filled 2013-02-12: qty 2

## 2013-02-12 MED ORDER — BUPIVACAINE HCL (PF) 0.25 % IJ SOLN
10.0000 mL | Freq: Once | INTRAMUSCULAR | Status: AC
  Administered 2013-02-12: 10 mL
  Filled 2013-02-12: qty 30

## 2013-02-12 MED ORDER — DOCUSATE SODIUM 100 MG PO CAPS: 100.0000 mg | ORAL_CAPSULE | Freq: Two times a day (BID) | ORAL | Status: DC

## 2013-02-12 MED ORDER — LIDOCAINE HCL (PF) 1 % IJ SOLN
5.0000 mL | Freq: Once | INTRAMUSCULAR | Status: AC
  Administered 2013-02-12: 5 mL
  Filled 2013-02-12: qty 5

## 2013-02-12 NOTE — ED Notes (Signed)
Pt alert and oriented.  Denies pain at present time following splinting procedure by physician.  No distress noted at present.

## 2013-02-12 NOTE — ED Notes (Signed)
Hematoma block by EDP at this time

## 2013-02-12 NOTE — ED Notes (Signed)
EDP is at the bedside. 

## 2013-02-12 NOTE — ED Notes (Addendum)
Pt reports turned around "too quick" and lost her balance.  Pt says fell onto cement carport.  PT has deformity to r wrist and bruising to r side of face and eye.   Reports take xarelto.  Radial pulse present, pt unable to move fingers without pain.  Hand warm to touch.

## 2013-02-12 NOTE — ED Provider Notes (Signed)
CSN: [id number]     Arrival date & time 02/12/13  1542 History  This chart was scribed for Raeford Razor, MD by Bennett Scrape, ED Scribe. This patient was seen in room APA12/APA12 and the patient's care was started at 4:32 PM.   Chief Complaint  Patient presents with  . Fall    The history is provided by the patient. No language interpreter was used.    HPI Comments: [name] is a 79 y.o. female who presents to the Emergency Department complaining of a fall after losing her balance PTA. Pt states that she turned suddenly while standing in a cement carport and fell landing on her right side. She denies head trauma. She c/o associated right wrist pain with a deformity and mild right facial pain. She reports decreased ROM to the right wrist secondary to pain. She is currently on Xarelto for her h/o A. Fib. She denies visual disturbances, LOC and HA as associated symptoms.    Past Medical History  Diagnosis Date  . Hypertension   . Atrial fibrillation   . Cancer     left side  . GERD (gastroesophageal reflux disease)   . Hyperlipidemia   . Osteoporosis   . Osteopenia   . Edema   . Chronic back pain   . Abdominal pain, chronic, right lower quadrant     "ever since I had my appendix removed"   Past Surgical History  Procedure Laterality Date  . Breast surgery    . Appendectomy    . Hernia repair    . Colon surgery     Family History  Problem Relation Age of Onset  . Heart disease    . Arthritis    . Cancer    . Diabetes Mother   . Hyperlipidemia Brother    History  Substance Use Topics  . Smoking status: Never Smoker   . Smokeless tobacco: Not on file  . Alcohol Use: No   OB History   Grav Para Term Preterm Abortions TAB SAB Ect Mult Living   2 1   1  1   1      Review of Systems  HENT: Negative for neck pain.   Eyes: Negative for visual disturbance.  Musculoskeletal: Positive for arthralgias.  Neurological: Negative for syncope and headaches.  All other  systems reviewed and are negative.    Allergies  Amoxicillin; Cephalosporins; Hydrocodone-causes itching per pt at bedside; Lipitor; Lovenox; Morphine and related; Penicillins; and Sulfa antibiotics  Home Medications   Current Outpatient Rx  Name  Route  Sig  Dispense  Refill  . ALPRAZolam (XANAX) 0.5 MG tablet   Oral   Take 0.25 mg by mouth 2 (two) times daily.         . citalopram (CELEXA) 20 MG tablet   Oral   Take 1 tablet (20 mg total) by mouth daily.   30 tablet   2     Start with 1/2 daily till follow up appointment   . metoprolol succinate (TOPROL-XL) 50 MG 24 hr tablet   Oral   Take 1 tablet (50 mg total) by mouth daily.   30 tablet   5   . Rivaroxaban (XARELTO) 15 MG TABS tablet   Oral   Take 15 mg by mouth daily.         Marland Kitchen estradiol (ESTRACE) 0.1 MG/GM vaginal cream      Use twice weekly as directed   42.5 g   1   . gabapentin (  NEURONTIN) 100 MG capsule      One twice a day for neuropathy   60 capsule   2   . omeprazole (PRILOSEC) 20 MG capsule   Oral   Take 20 mg by mouth daily.           Triage Vitals: BP 171/64  Pulse 64  Temp(Src) 98.2 F (36.8 C) (Oral)  Resp 18  Ht 5\' 4"  (1.626 m)  Wt 154 lb (69.854 kg)  BMI 26.42 kg/m2  SpO2 100%  Physical Exam  Nursing note and vitals reviewed. Constitutional: She is oriented to person, place, and time. She appears well-developed and well-nourished. No distress.  HENT:  Head: Normocephalic.  Right periorbital ecchymosis without significant bony tenderness   Eyes: EOM are normal.  Neck: Neck supple. No tracheal deviation present.  Cardiovascular: Normal rate.   Pulmonary/Chest: Effort normal. No respiratory distress.  Musculoskeletal:  Dinner fork deformity to the right wrist, closed injury, NVI distally, decreased ROM of the right fingers secondary to pain, no midine spinal tenderness  Neurological: She is alert and oriented to person, place, and time. No cranial nerve deficit.   Strength is 5/5 in bilateral upper and lower extremities besides RUE grip strength being reduced secondary to pain  Skin: Skin is warm and dry.  Small abrasion to the right knee, no tenderness or effusion, ROM without pain  Psychiatric: She has a normal mood and affect. Her behavior is normal.    ED Course  Reduction of fracture Date/Time: 02/12/2013 7:00 PM Performed by: Raeford Razor Authorized by: Raeford Razor Consent: Verbal consent obtained. Risks and benefits: risks, benefits and alternatives were discussed Consent given by: patient Required items: required blood products, implants, devices, and special equipment available Patient identity confirmed: verbally with patient, arm band and provided demographic data Time out: Immediately prior to procedure a "time out" was called to verify the correct patient, procedure, equipment, support staff and site/side marked as required. Preparation: Patient was prepped and draped in the usual sterile fashion. Local anesthesia used: yes Anesthesia: hematoma block Local anesthetic: bupivacaine 0.25% without epinephrine and lidocaine 1% without epinephrine Anesthetic total: 10 ml Patient tolerance: Patient tolerated the procedure well with no immediate complications. Comments: Hematoma block with 10cc of 1:1 lido/marcaine. RUE suspended from finger traps with an additional 10 lbs of traction.    SPLINT APPLICATION Date/Time: 09/28/2012 7:00 PM Authorized by: Raeford Razor Consent: Verbal consent obtained. Risks and benefits: risks, benefits and alternatives were discussed Consent given by: patient Splint applied by: Dr Juleen China Location details: R wrist Splint type: volar Supplies used: ortho glass, ACE bandages Post-procedure: The splinted body part was neurovascularly unchanged following the procedure. Patient tolerance: Patient tolerated the procedure well with no immediate complications.  Hematoma Block Indication: Pain  relief Location: R wrist Medications: 5cc of 1% lidocaine w/o epinephrine. 5cc of 0.25% bupivacaine w/o epinephrine Preparation: area widely cleansed with cloraprep Comments: hematoma dorsal aspect R wrist with 25g needle directed towards fracture. Dark blood aspirated prior to medication injection. Pt tolerated well w/no immediate complications.    (including critical care time)  DIAGNOSTIC STUDIES: Oxygen Saturation is 100% on room air, normal by my interpretation.    COORDINATION OF CARE: 4:36 PM-Discussed treatment plan which includes possible reduction with pt at bedside and pt agreed to plan.   5:28 PM-Discussed reduction procedure with the pt and pt agreed.  8:57 PM-Discussed discharge plan which includes pain medication along with NSAIDs with pt and pt agreed to plan. Also advised pt  to follow up with hand specialist and pt agreed. Addressed symptoms to return for with pt.   Labs Review Labs Reviewed - No data to display Imaging Review   Dg Wrist Complete Right  02/12/2013   CLINICAL DATA:  Postreduction and casting.  EXAM: RIGHT WRIST - COMPLETE 3+ VIEW  COMPARISON:  Earlier same date.  FINDINGS: There is less impaction, posterior displacement and angulation of the distal radial fracture status post closed reduction and splinting. There is a minimally displaced fracture of the ulnar styloid. Degenerative changes are present throughout the carpal bones.  IMPRESSION: Improved alignment of distal radial fracture status post closed reduction and splinting.   Electronically Signed   By: Roxy Horseman   On: 02/12/2013 20:37   Dg Wrist Complete Right  02/12/2013   CLINICAL DATA:  Fall. Wrist pain and deformity.  EXAM: RIGHT WRIST - COMPLETE 3+ VIEW  COMPARISON:  None.  FINDINGS: Comminuted distal radial fracture noted with apex anterior angulation and intra-articular extension into the distal radial articular surface and distal radioulnar joint.  Degenerative findings at the 1st  carpometacarpal joint and laterally in the carpus.  There is also mild soft tissue swelling adjacent to the ulna, with equivocal fracture of the ulnar styloid.  IMPRESSION: 1. Comminuted distal radial fracture with transverse metaphyseal component and a fracture component extending into the distal radial articular surface. Resulting positive ulnar variance observed. 2. Degenerative findings in the lateral carpus. 3. Equivocal ulnar styloid fracture.   Electronically Signed   By: Herbie Baltimore   On: 02/12/2013 17:05   Ct Head Wo Contrast  02/12/2013   CLINICAL DATA:  Fall. Right face injury.  On blood thinner  EXAM: CT HEAD WITHOUT CONTRAST  TECHNIQUE: Contiguous axial images were obtained from the base of the skull through the vertex without intravenous contrast.  COMPARISON:  CT 09/06/2008  FINDINGS: Mild ventricular enlargement stable. This may be related to atrophy. No acute infarct. Negative for intracranial hemorrhage. No acute infarct or mass. Negative for skull fracture.  IMPRESSION: No acute abnormality and no change from 2010.   Electronically Signed   By: Marlan Palau M.D.   On: 02/12/2013 17:08    MDM   1. Colles' fracture, closed, right, initial encounter   2. Facial contusion, initial encounter    89yF presenting after mechanical fall. On xarelto for hx of afib. Nonfocal neuro exam. CT head w/o emergent abnormality. Closed R wrist fx which was reduced with better alignment. Remained HD stable through out ED stay and NV intact RUE prior to DC. Hand follow-up. PRN pain meds. Return precautions discussed.   I personally preformed the services scribed in my presence. The recorded information has been reviewed is accurate. Raeford Razor, MD.       Raeford Razor, MD 02/14/13 [phone]

## 2013-02-12 NOTE — ED Notes (Signed)
Pt just returned from radiology.  Repositioned for comfort.  Ice pack in place.  POC discussed

## 2013-02-13 ENCOUNTER — Ambulatory Visit: Payer: Medicare Other | Admitting: Family Medicine

## 2013-02-14 ENCOUNTER — Telehealth: Payer: Self-pay | Admitting: Family Medicine

## 2013-02-14 MED ORDER — TRAMADOL HCL 50 MG PO TABS: 50.0000 mg | ORAL_TABLET | Freq: Four times a day (QID) | ORAL | Status: DC | PRN

## 2013-02-14 NOTE — Telephone Encounter (Signed)
Went to ER due to Broken Wrist.  Medication oxyCODONE-acetaminophen (PERCOCET/ROXICET) 5-325 MG per tablet, that she was given is making her itch.  Wants to know if she can take ALEVE for the pain.  Tylenol does not help with the pain.  Please call Patient. Thanks

## 2013-02-14 NOTE — Telephone Encounter (Signed)
Cannot do Aleve. Try tramadol 50 mg 1 4 times a day when necessary, #30.

## 2013-02-14 NOTE — Telephone Encounter (Signed)
Medication sent to pharmacy. Patient was notified.  

## 2013-02-27 ENCOUNTER — Ambulatory Visit: Payer: Medicare Other | Admitting: Family Medicine

## 2013-03-15 ENCOUNTER — Ambulatory Visit: Payer: Medicare Other | Admitting: Family Medicine

## 2013-03-18 ENCOUNTER — Ambulatory Visit (INDEPENDENT_AMBULATORY_CARE_PROVIDER_SITE_OTHER): Payer: Medicare Other | Admitting: Family Medicine

## 2013-03-18 ENCOUNTER — Encounter: Payer: Self-pay | Admitting: Family Medicine

## 2013-03-18 VITALS — BP 120/70 | Ht 64.0 in | Wt 155.2 lb

## 2013-03-18 DIAGNOSIS — Z23 Encounter for immunization: Secondary | ICD-10-CM

## 2013-03-18 DIAGNOSIS — R5381 Other malaise: Secondary | ICD-10-CM

## 2013-03-18 DIAGNOSIS — Z79899 Other long term (current) drug therapy: Secondary | ICD-10-CM

## 2013-03-18 DIAGNOSIS — E785 Hyperlipidemia, unspecified: Secondary | ICD-10-CM

## 2013-03-18 MED ORDER — METOPROLOL SUCCINATE ER 25 MG PO TB24: 25.0000 mg | ORAL_TABLET | Freq: Every day | ORAL | Status: DC

## 2013-03-18 MED ORDER — OXYCODONE-ACETAMINOPHEN 5-325 MG PO TABS: 1.0000 | ORAL_TABLET | Freq: Four times a day (QID) | ORAL | Status: DC | PRN

## 2013-03-18 NOTE — Patient Instructions (Signed)
Keep medication in safe place Don't use pain medicine if it causes itching or cause dizziness/drowsiness

## 2013-03-18 NOTE — Addendum Note (Signed)
Addended by: Lilyan Punt A on: 03/18/2013 05:33 PM   Modules accepted: Level of Service

## 2013-03-18 NOTE — Progress Notes (Addendum)
  Subjective:    Patient ID: [name], female    DOB: 24-Aug-1923, 79 y.o.   MRN: [id number]  Hyperlipidemia This is a chronic problem. The current episode started more than 1 year ago. The problem is controlled. There are no known factors aggravating her hyperlipidemia. She is currently on no antihyperlipidemic treatment. The current treatment provides mild improvement of lipids. There are no compliance problems.  There are no known risk factors for coronary artery disease.  Patient states that she has no new concerns at this time.  She did fracture her wrist it is under the care of orthopedic the patient had several questions Patient relates taking her medicines as directed Denies any flareups of atrial fibrillation recently. She does get fatigued and tired and dizzy at times. Review of Systems    patient denies any headaches chest tightness pressure pain shortness of breath. Objective:   Physical Exam  Heart rate is controlled blood pressure under good control lungs clear heart regular extremities no edema skin warm dry Her right wrist has a brace where she had a fracture I reviewed over the films with her plus also what she can expect     Assessment & Plan:  #1 atrial fib good control HTN reduce Toprol, new dose 25 mg XL Wrist fracture-follow through with orthopedics the patient was given a prescription for oxycodone/Percocet #24 she was told to put these in a safe place only use a half a tablet at a time. She relates hydrocodone causes itching but Percocet did not. ER gave her a few but she asked for more. Lab work on followup followup here in 3-4 months

## 2013-03-21 LAB — LIPID PANEL
Cholesterol: 210 mg/dL — ABNORMAL HIGH (ref 0–200)
HDL: 57 mg/dL (ref >39)
LDL Cholesterol: 116 mg/dL — ABNORMAL HIGH (ref 0–99)
Total CHOL/HDL Ratio: 3.7 Ratio
Triglycerides: 184 mg/dL — ABNORMAL HIGH (ref <150)

## 2013-03-21 LAB — BASIC METABOLIC PANEL
BUN: 17 mg/dL (ref 6–23)
Chloride: 106 mEq/L (ref 96–112)
Glucose, Bld: 114 mg/dL — ABNORMAL HIGH (ref 70–99)
Potassium: 3.9 mEq/L (ref 3.5–5.3)
Sodium: 141 mEq/L (ref 135–145)

## 2013-03-21 LAB — CBC WITH DIFFERENTIAL/PLATELET
Basophils Absolute: 0 10*3/uL (ref 0.0–0.1)
HCT: 40.3 % (ref 36.0–46.0)
Lymphocytes Relative: 38 % (ref 12–46)
MCHC: 34.2 g/dL (ref 30.0–36.0)
Monocytes Absolute: 0.7 10*3/uL (ref 0.1–1.0)
Neutro Abs: 3.9 10*3/uL (ref 1.7–7.7)
Neutrophils Relative %: 51 % (ref 43–77)
RDW: 13.7 % (ref 11.5–15.5)
WBC: 7.7 10*3/uL (ref 4.0–10.5)

## 2013-03-21 LAB — HEPATIC FUNCTION PANEL
AST: 15 U/L (ref 0–37)
Albumin: 4 g/dL (ref 3.5–5.2)
Alkaline Phosphatase: 68 U/L (ref 39–117)
Total Bilirubin: 0.6 mg/dL (ref 0.3–1.2)
Total Protein: 6.4 g/dL (ref 6.0–8.3)

## 2013-03-25 ENCOUNTER — Encounter: Payer: Self-pay | Admitting: Family Medicine

## 2013-05-03 ENCOUNTER — Other Ambulatory Visit: Payer: Self-pay | Admitting: Cardiovascular Disease

## 2013-05-04 ENCOUNTER — Emergency Department (HOSPITAL_COMMUNITY): Payer: Medicare Other

## 2013-05-04 ENCOUNTER — Other Ambulatory Visit: Payer: Self-pay

## 2013-05-04 ENCOUNTER — Encounter (HOSPITAL_COMMUNITY): Payer: Self-pay | Admitting: Emergency Medicine

## 2013-05-04 ENCOUNTER — Emergency Department (HOSPITAL_COMMUNITY)
Admission: EM | Admit: 2013-05-04 | Discharge: 2013-05-04 | Disposition: A | Discharge status: Home / Self Care | Payer: Medicare Other | Attending: Emergency Medicine | Admitting: Emergency Medicine

## 2013-05-04 DIAGNOSIS — Z7901 Long term (current) use of anticoagulants: Secondary | ICD-10-CM | POA: Insufficient documentation

## 2013-05-04 DIAGNOSIS — E785 Hyperlipidemia, unspecified: Secondary | ICD-10-CM | POA: Insufficient documentation

## 2013-05-04 DIAGNOSIS — G8929 Other chronic pain: Secondary | ICD-10-CM | POA: Insufficient documentation

## 2013-05-04 DIAGNOSIS — Z8739 Personal history of other diseases of the musculoskeletal system and connective tissue: Secondary | ICD-10-CM | POA: Insufficient documentation

## 2013-05-04 DIAGNOSIS — Z79899 Other long term (current) drug therapy: Secondary | ICD-10-CM | POA: Insufficient documentation

## 2013-05-04 DIAGNOSIS — N390 Urinary tract infection, site not specified: Secondary | ICD-10-CM

## 2013-05-04 DIAGNOSIS — Z880 Allergy status to penicillin: Secondary | ICD-10-CM | POA: Insufficient documentation

## 2013-05-04 DIAGNOSIS — Z859 Personal history of malignant neoplasm, unspecified: Secondary | ICD-10-CM | POA: Insufficient documentation

## 2013-05-04 DIAGNOSIS — I4891 Unspecified atrial fibrillation: Secondary | ICD-10-CM

## 2013-05-04 DIAGNOSIS — K219 Gastro-esophageal reflux disease without esophagitis: Secondary | ICD-10-CM | POA: Insufficient documentation

## 2013-05-04 DIAGNOSIS — I10 Essential (primary) hypertension: Secondary | ICD-10-CM | POA: Insufficient documentation

## 2013-05-04 LAB — CBC WITH DIFFERENTIAL/PLATELET
Eosinophils Absolute: 0 10*3/uL (ref 0.0–0.7)
Eosinophils Relative: 1 % (ref 0–5)
HCT: 43.9 % (ref 36.0–46.0)
Hemoglobin: 14.9 g/dL (ref 12.0–15.0)
Lymphocytes Relative: 26 % (ref 12–46)
Lymphs Abs: 2.2 10*3/uL (ref 0.7–4.0)
MCH: 29 pg (ref 26.0–34.0)
MCV: 85.6 fL (ref 78.0–100.0)
Monocytes Absolute: 0.5 10*3/uL (ref 0.1–1.0)
Monocytes Relative: 6 % (ref 3–12)
Neutro Abs: 5.9 10*3/uL (ref 1.7–7.7)
RBC: 5.13 MIL/uL — ABNORMAL HIGH (ref 3.87–5.11)
RDW: 12.1 % (ref 11.5–15.5)
WBC: 8.7 10*3/uL (ref 4.0–10.5)

## 2013-05-04 LAB — COMPREHENSIVE METABOLIC PANEL
ALT: 12 U/L (ref 0–35)
BUN: 18 mg/dL (ref 6–23)
CO2: 24 mEq/L (ref 19–32)
Calcium: 9.8 mg/dL (ref 8.4–10.5)
GFR calc Af Amer: 85 mL/min — ABNORMAL LOW (ref >90)
GFR calc non Af Amer: 74 mL/min — ABNORMAL LOW (ref >90)
Glucose, Bld: 175 mg/dL — ABNORMAL HIGH (ref 70–99)
Potassium: 3.6 mEq/L (ref 3.5–5.1)
Total Bilirubin: 0.4 mg/dL (ref 0.3–1.2)
Total Protein: 7.7 g/dL (ref 6.0–8.3)

## 2013-05-04 LAB — URINALYSIS, ROUTINE W REFLEX MICROSCOPIC
Bilirubin Urine: NEGATIVE
Nitrite: POSITIVE — AB
Protein, ur: NEGATIVE mg/dL
Specific Gravity, Urine: 1.01 (ref 1.005–1.030)
Urobilinogen, UA: 0.2 mg/dL (ref 0.0–1.0)

## 2013-05-04 LAB — LIPASE, BLOOD: Lipase: 20 U/L (ref 11–59)

## 2013-05-04 LAB — URINE MICROSCOPIC-ADD ON

## 2013-05-04 MED ORDER — CIPROFLOXACIN HCL 250 MG PO TABS
500.0000 mg | ORAL_TABLET | Freq: Once | ORAL | Status: AC
  Administered 2013-05-04: 500 mg via ORAL
  Filled 2013-05-04: qty 2

## 2013-05-04 MED ORDER — CIPROFLOXACIN HCL 500 MG PO TABS: 500.0000 mg | ORAL_TABLET | Freq: Two times a day (BID) | ORAL | Status: DC

## 2013-05-04 MED ORDER — SODIUM CHLORIDE 0.9 % IV BOLUS (SEPSIS)
500.0000 mL | Freq: Once | INTRAVENOUS | Status: AC
  Administered 2013-05-04: 500 mL via INTRAVENOUS

## 2013-05-04 MED ORDER — ONDANSETRON HCL 4 MG/2ML IJ SOLN
4.0000 mg | Freq: Once | INTRAMUSCULAR | Status: AC
  Administered 2013-05-04: 4 mg via INTRAVENOUS
  Filled 2013-05-04: qty 2

## 2013-05-04 NOTE — ED Notes (Addendum)
Pt c/o her "heart beating real fast" earlier today. Pt got nauseated and vomited. Pt also c/o bilateral shoulder pain (1wk). Right now pt states she can't "exactly explain how I feel." EDP at bedside.

## 2013-05-04 NOTE — ED Provider Notes (Signed)
CSN: [id number]     Arrival date & time 05/04/13  1944 History  This chart was scribed for Charles B. Bernette Mayers, MD by Ronal Fear, ED Scribe. This patient was seen in room APA18/APA18 and the patient's care was started at 7:52 PM.    Chief Complaint  Patient presents with  . Tachycardia  . Nausea  . Emesis   (Consider location/radiation/quality/duration/timing/severity/associated sxs/prior Treatment) HPI HPI Comments:  [name] is a 79 y.o. female presents to the Emergency Department complaining of general malaise onset this morning and gradually worsening through the day, associated with 6 episodes of Emesis around 3pm this afternoon with associated diaphoresis, mild epigastric abdominal pain. Pt denies blood in vomit, no CP or SOB. She is no longer on coumadin, she is currently on Xarelto. Pt denies any other changes in medication or missed doses recently.   Past Medical History  Diagnosis Date  . Hypertension   . Atrial fibrillation   . Cancer     left side  . GERD (gastroesophageal reflux disease)   . Hyperlipidemia   . Osteoporosis   . Osteopenia   . Edema   . Chronic back pain   . Abdominal pain, chronic, right lower quadrant     "ever since I had my appendix removed"   Past Surgical History  Procedure Laterality Date  . Breast surgery    . Appendectomy    . Hernia repair    . Colon surgery     Family History  Problem Relation Age of Onset  . Heart disease    . Arthritis    . Cancer    . Diabetes Mother   . Hyperlipidemia Brother    History  Substance Use Topics  . Smoking status: Never Smoker   . Smokeless tobacco: Not on file  . Alcohol Use: No   OB History   Grav Para Term Preterm Abortions TAB SAB Ect Mult Living   2 1   1  1   1      Review of Systems 10 Systems reviewed and are negative for acute change except as noted in the HPI.  Allergies  Amoxicillin; Cephalosporins; Hydrocodone; Lipitor; Lovenox; Morphine and related; Penicillins; and  Sulfa antibiotics  Home Medications   Current Outpatient Rx  Name  Route  Sig  Dispense  Refill  . Rivaroxaban (XARELTO) 15 MG TABS tablet   Oral   Take 15 mg by mouth daily. With FOOD         . ALPRAZolam (XANAX) 0.5 MG tablet   Oral   Take 0.25-0.5 mg by mouth at bedtime as needed for sleep. Prescribed one-half tablet twice daily as needed for anxiety         . citalopram (CELEXA) 20 MG tablet   Oral   Take 10 mg by mouth every morning.         . metoprolol succinate (TOPROL XL) 25 MG 24 hr tablet   Oral   Take 1 tablet (25 mg total) by mouth daily.   30 tablet   11     Lower dose   . omeprazole (PRILOSEC) 20 MG capsule   Oral   Take 20 mg by mouth daily as needed (for heartburn/ acid reflux).          Marland Kitchen oxyCODONE-acetaminophen (ROXICET) 5-325 MG per tablet   Oral   Take 1 tablet by mouth every 6 (six) hours as needed for pain.   24 tablet   0   . traMADol (  ULTRAM) 50 MG tablet   Oral   Take 1 tablet (50 mg total) by mouth 4 (four) times daily as needed for pain.   30 tablet   0    There were no vitals taken for this visit. Physical Exam  Nursing note and vitals reviewed. Constitutional: She is oriented to person, place, and time. She appears well-developed and well-nourished.  HENT:  Head: Normocephalic and atraumatic.  Eyes: EOM are normal. Pupils are equal, round, and reactive to light.  Neck: Normal range of motion. Neck supple.  Cardiovascular: Normal heart sounds and intact distal pulses.  An irregular rhythm present. Tachycardia present.   Pulmonary/Chest: Effort normal and breath sounds normal.  Abdominal: Bowel sounds are normal. She exhibits no distension. There is tenderness (mild epigastric).  Musculoskeletal: Normal range of motion. She exhibits no edema and no tenderness.  Neurological: She is alert and oriented to person, place, and time. She has normal strength. No cranial nerve deficit or sensory deficit.  Skin: Skin is warm and dry.  No rash noted.  Psychiatric: She has a normal mood and affect.    ED Course  Procedures (including critical care time) DIAGNOSTIC STUDIES:  COORDINATION OF CARE:  7:55 PM- Pt advised of plan for treatment including IV fluids medication for nausea and and pt agrees.  Labs Review Labs Reviewed  CBC WITH DIFFERENTIAL - Abnormal; Notable for the following:    RBC 5.13 (*)    All other components within normal limits  COMPREHENSIVE METABOLIC PANEL - Abnormal; Notable for the following:    Glucose, Bld 175 (*)    GFR calc non Af Amer 74 (*)    GFR calc Af Amer 85 (*)    All other components within normal limits  URINALYSIS, ROUTINE W REFLEX MICROSCOPIC - Abnormal; Notable for the following:    APPearance HAZY (*)    Hgb urine dipstick TRACE (*)    Nitrite POSITIVE (*)    Leukocytes, UA SMALL (*)    All other components within normal limits  URINE MICROSCOPIC-ADD ON - Abnormal; Notable for the following:    Bacteria, UA MANY (*)    All other components within normal limits  URINE CULTURE  LIPASE, BLOOD  TROPONIN I   Imaging Review Dg Chest 2 View  05/04/2013   CLINICAL DATA:  Shortness of breath.  nausea and vomiting.  EXAM: CHEST  2 VIEW  COMPARISON:  10/03/2012  FINDINGS: Mild cardiomegaly is stable as well as left lower lobe scarring. No evidence of acute infiltrate or edema. No evidence of pleural effusion. No mass or lymphadenopathy identified.  IMPRESSION: Stable cardiomegaly and left lower lobe scarring. No active disease.   Electronically Signed   By: Myles Rosenthal M.D.   On: 05/04/2013 21:21   EKG result did not cross from MUSE. Afib, rate 110s, nonspecific ST-T wave abnormalities, normal intervals otherwise.    MDM   1. UTI (urinary tract infection)   2. A-fib     HR improved with IVF, steady in 90s now. Labs and imaging unremarkable aside from UTI. Pt asking to go home. Has frequent UTI, has taken Cipro in the past. PCP followup.   I personally performed the  services described in this documentation, which was scribed in my presence. The recorded information has been reviewed and is accurate.      Charles B. Bernette Mayers, MD 05/04/13 2207

## 2013-05-07 LAB — URINE CULTURE

## 2013-05-14 ENCOUNTER — Encounter: Payer: Self-pay | Admitting: Family Medicine

## 2013-05-14 ENCOUNTER — Ambulatory Visit (INDEPENDENT_AMBULATORY_CARE_PROVIDER_SITE_OTHER): Payer: Medicare Other | Admitting: Family Medicine

## 2013-05-14 VITALS — BP 124/76 | Ht 64.0 in | Wt 152.2 lb

## 2013-05-14 DIAGNOSIS — R5381 Other malaise: Secondary | ICD-10-CM

## 2013-05-14 DIAGNOSIS — N390 Urinary tract infection, site not specified: Secondary | ICD-10-CM

## 2013-05-14 LAB — POCT URINALYSIS DIPSTICK
Spec Grav, UA: 1.02
pH, UA: 5

## 2013-05-14 NOTE — Progress Notes (Signed)
   Subjective:    Patient ID: [name], female    DOB: 01-Feb-1924, 79 y.o.   MRN: [id number]  HPI Patient is here today for ED follow up visit. She was seen at [facility] ER on 05/04/13 for nausea, emesis and abdominal pain. She states that she was diagnosed with a UTI. States she feels bad all the time but since the ER visit her symptoms have improved.    Review of Systems  Constitutional: Negative for activity change, appetite change and fatigue.  HENT: Negative for congestion, ear discharge and rhinorrhea.   Eyes: Negative for discharge.  Respiratory: Negative for cough, chest tightness and wheezing.   Cardiovascular: Negative for chest pain.  Gastrointestinal: Negative for vomiting and abdominal pain.  Genitourinary: Negative for frequency and difficulty urinating.  Musculoskeletal: Negative for neck pain.  Allergic/Immunologic: Negative for environmental allergies and food allergies.  Neurological: Negative for weakness and headaches.  Psychiatric/Behavioral: Negative for behavioral problems and agitation.       Objective:   Physical Exam  Constitutional: She is oriented to person, place, and time. She appears well-developed and well-nourished.  HENT:  Head: Normocephalic.  Right Ear: External ear normal.  Left Ear: External ear normal.  Eyes: Pupils are equal, round, and reactive to light.  Neck: Normal range of motion. No thyromegaly present.  Cardiovascular: Normal rate, regular rhythm, normal heart sounds and intact distal pulses.   No murmur heard. Pulmonary/Chest: Effort normal and breath sounds normal. No respiratory distress. She has no wheezes.  Abdominal: Soft. Bowel sounds are normal. She exhibits no distension and no mass. There is no tenderness.  Musculoskeletal: Normal range of motion. She exhibits no edema and no tenderness.  Lymphadenopathy:    She has no cervical adenopathy.  Neurological: She is alert and oriented to person, place, and time. She exhibits  normal muscle tone.  Skin: Skin is warm and dry.  Psychiatric: She has a normal mood and affect. Her behavior is normal.          Assessment & Plan:  Urinary tract infection is gone away no need for further antibiotics Fatigue and tiredness related to her age. I don't find any evidence of any underlying disease if high fevers or worse followup recheck in 3 months

## 2013-06-13 ENCOUNTER — Other Ambulatory Visit: Payer: Self-pay | Admitting: Family Medicine

## 2013-07-03 ENCOUNTER — Emergency Department (HOSPITAL_COMMUNITY): Payer: Medicare Other

## 2013-07-03 ENCOUNTER — Encounter (HOSPITAL_COMMUNITY): Payer: Self-pay | Admitting: Emergency Medicine

## 2013-07-03 ENCOUNTER — Emergency Department (HOSPITAL_COMMUNITY)
Admission: EM | Admit: 2013-07-03 | Discharge: 2013-07-03 | Disposition: A | Discharge status: Home / Self Care | Payer: Medicare Other | Attending: Emergency Medicine | Admitting: Emergency Medicine

## 2013-07-03 DIAGNOSIS — Z862 Personal history of diseases of the blood and blood-forming organs and certain disorders involving the immune mechanism: Secondary | ICD-10-CM | POA: Insufficient documentation

## 2013-07-03 DIAGNOSIS — I4891 Unspecified atrial fibrillation: Secondary | ICD-10-CM | POA: Insufficient documentation

## 2013-07-03 DIAGNOSIS — I4892 Unspecified atrial flutter: Secondary | ICD-10-CM

## 2013-07-03 DIAGNOSIS — Z8639 Personal history of other endocrine, nutritional and metabolic disease: Secondary | ICD-10-CM | POA: Insufficient documentation

## 2013-07-03 DIAGNOSIS — Z7901 Long term (current) use of anticoagulants: Secondary | ICD-10-CM | POA: Insufficient documentation

## 2013-07-03 DIAGNOSIS — M545 Low back pain, unspecified: Secondary | ICD-10-CM | POA: Insufficient documentation

## 2013-07-03 DIAGNOSIS — K219 Gastro-esophageal reflux disease without esophagitis: Secondary | ICD-10-CM | POA: Insufficient documentation

## 2013-07-03 DIAGNOSIS — I10 Essential (primary) hypertension: Secondary | ICD-10-CM | POA: Insufficient documentation

## 2013-07-03 DIAGNOSIS — G8929 Other chronic pain: Secondary | ICD-10-CM | POA: Insufficient documentation

## 2013-07-03 DIAGNOSIS — Z880 Allergy status to penicillin: Secondary | ICD-10-CM | POA: Insufficient documentation

## 2013-07-03 DIAGNOSIS — M25519 Pain in unspecified shoulder: Secondary | ICD-10-CM | POA: Insufficient documentation

## 2013-07-03 DIAGNOSIS — Z79899 Other long term (current) drug therapy: Secondary | ICD-10-CM | POA: Insufficient documentation

## 2013-07-03 DIAGNOSIS — Z859 Personal history of malignant neoplasm, unspecified: Secondary | ICD-10-CM | POA: Insufficient documentation

## 2013-07-03 LAB — BASIC METABOLIC PANEL
BUN: 17 mg/dL (ref 6–23)
CHLORIDE: 105 meq/L (ref 96–112)
CO2: 23 meq/L (ref 19–32)
Calcium: 9.3 mg/dL (ref 8.4–10.5)
Creatinine, Ser: 0.71 mg/dL (ref 0.50–1.10)
GFR calc Af Amer: 87 mL/min — ABNORMAL LOW (ref >90)
GFR calc non Af Amer: 75 mL/min — ABNORMAL LOW (ref >90)
Glucose, Bld: 187 mg/dL — ABNORMAL HIGH (ref 70–99)
Potassium: 3.4 mEq/L — ABNORMAL LOW (ref 3.7–5.3)
Sodium: 143 mEq/L (ref 137–147)

## 2013-07-03 LAB — CBC WITH DIFFERENTIAL/PLATELET
BASOS ABS: 0 10*3/uL (ref 0.0–0.1)
Basophils Relative: 0 % (ref 0–1)
Eosinophils Absolute: 0.1 10*3/uL (ref 0.0–0.7)
Eosinophils Relative: 1 % (ref 0–5)
HEMATOCRIT: 42.6 % (ref 36.0–46.0)
Hemoglobin: 14.2 g/dL (ref 12.0–15.0)
LYMPHS PCT: 24 % (ref 12–46)
Lymphs Abs: 2.1 10*3/uL (ref 0.7–4.0)
MCH: 28.7 pg (ref 26.0–34.0)
MCHC: 33.3 g/dL (ref 30.0–36.0)
MCV: 86.2 fL (ref 78.0–100.0)
MONO ABS: 0.7 10*3/uL (ref 0.1–1.0)
MONOS PCT: 8 % (ref 3–12)
NEUTROS ABS: 5.9 10*3/uL (ref 1.7–7.7)
Neutrophils Relative %: 67 % (ref 43–77)
Platelets: 283 10*3/uL (ref 150–400)
RBC: 4.94 MIL/uL (ref 3.87–5.11)
RDW: 12.8 % (ref 11.5–15.5)
WBC: 8.9 10*3/uL (ref 4.0–10.5)

## 2013-07-03 LAB — TROPONIN I

## 2013-07-03 MED ORDER — METOPROLOL TARTRATE 25 MG PO TABS: 12.5000 mg | ORAL_TABLET | Freq: Four times a day (QID) | ORAL | Status: DC | PRN

## 2013-07-03 MED ORDER — DEXTROSE 5 % IV SOLN
10.0000 mg/h | Freq: Once | INTRAVENOUS | Status: AC
Start: 2013-07-03 — End: 2013-07-03
  Administered 2013-07-03: 10 mg/h via INTRAVENOUS
  Filled 2013-07-03: qty 100

## 2013-07-03 NOTE — ED Provider Notes (Signed)
CSN: [id number]     Arrival date & time 07/03/13  1052 History   This chart was scribed for Katherine Blade, MD by Era Bumpers, ED scribe. This patient was seen in room APA18/APA18 and the patient's care was started at 1052.  Chief Complaint  Patient presents with  . Irregular Heart Beat   The history is provided by the patient. No language interpreter was used.   HPI Comments: [name] is a 80 y.o. female who presents to the Emergency Department w/hx of A-fib complaining of elevated heart rate and dizziness since yesterday. She has hx of A-fib and reports that yesterday her HR feels that it is fluctuating and is too fast or too slow at times. She reports the same w/her BP which fluctuates too low or too high at times. She states these sx began yesterday. She reports feeling dizzy when she stands but is not currently dizzy while laying down comfortably in the bed. She takes metoprolol for hypertension and was advised by her PCP Dr. Wolfgang Phoenix to take extra dose of her medicine if her BP was not being managed well w/x1 dose. Yesterday was the first time over the past x8 months that she took x2 doses in one day to control her BP. She states recently she has had no illnesses and that she has had a normal appetite recently. She denies CP, cough or HA. She has been ambulating normally w/out difficulty which is her baseline. She ate half a bowl of cereal this morning and states she would have eaten more but did not have anything else to eat at the time. She reports chronic left shoulder and lower back pain since x1 year ago.   Her PCP is Dr. Wolfgang Phoenix whom she saw for a checkup x1 month ago  Past Medical History  Diagnosis Date  . Hypertension   . Atrial fibrillation   . Cancer     left side  . GERD (gastroesophageal reflux disease)   . Hyperlipidemia   . Osteoporosis   . Osteopenia   . Edema   . Chronic back pain   . Abdominal pain, chronic, right lower quadrant     "ever since I had my appendix  removed"   Past Surgical History  Procedure Laterality Date  . Breast surgery    . Appendectomy    . Hernia repair    . Colon surgery     Family History  Problem Relation Age of Onset  . Heart disease    . Arthritis    . Cancer    . Diabetes Mother   . Hyperlipidemia Brother    History  Substance Use Topics  . Smoking status: Never Smoker   . Smokeless tobacco: Not on file  . Alcohol Use: No   OB History   Grav Para Term Preterm Abortions TAB SAB Ect Mult Living   2 1   1  1   1      Review of Systems  Constitutional: Negative for fever and chills.  Respiratory: Negative for cough and shortness of breath.   Cardiovascular: Negative for chest pain.  Gastrointestinal: Negative for abdominal pain.  Musculoskeletal: Negative for back pain.  Neurological: Positive for dizziness. Negative for headaches.  All other systems reviewed and are negative.    Allergies  Amoxicillin; Cephalosporins; Hydrocodone; Lipitor; Lovenox; Morphine and related; Penicillins; and Sulfa antibiotics  Home Medications   Current Outpatient Rx  Name  Route  Sig  Dispense  Refill  . ALPRAZolam (  XANAX) 0.5 MG tablet   Oral   Take 0.25-0.5 mg by mouth at bedtime as needed for sleep. Prescribed one-half tablet twice daily as needed for anxiety         . calcium carbonate (TUMS EX) 750 MG chewable tablet   Oral   Chew 1 tablet by mouth daily as needed for heartburn.         . metoprolol succinate (TOPROL XL) 25 MG 24 hr tablet   Oral   Take 1 tablet (25 mg total) by mouth daily.   30 tablet   11     Lower dose   . omeprazole (PRILOSEC) 20 MG capsule   Oral   Take 20 mg by mouth daily as needed (for heartburn/ acid reflux).          . Rivaroxaban (XARELTO) 15 MG TABS tablet   Oral   Take 15 mg by mouth daily. With FOOD         . metoprolol tartrate (LOPRESSOR) 25 MG tablet   Oral   Take 0.5 tablets (12.5 mg total) by mouth every 6 (six) hours as needed (Use for palpitations,  except when you are feeling dizzy).   20 tablet   0    Triage Vitals: Ht 5\' 4"  (1.626 m)  Wt 150 lb (68.04 kg)  BMI 25.73 kg/m2 Physical Exam  Nursing note and vitals reviewed. Constitutional: She is oriented to person, place, and time. She appears well-developed and well-nourished. No distress.  HENT:  Head: Normocephalic and atraumatic.  Eyes: Conjunctivae are normal. Right eye exhibits no discharge. Left eye exhibits no discharge.  Neck: Normal range of motion.  Cardiovascular: Normal rate.   Pulmonary/Chest: Effort normal. No respiratory distress.  Musculoskeletal: Normal range of motion. She exhibits no edema.  Neurological: She is alert and oriented to person, place, and time.  Skin: Skin is warm and dry.  Psychiatric: She has a normal mood and affect. Thought content normal.    ED Course  Procedures (including critical care time) DIAGNOSTIC STUDIES: Oxygen Saturation is 94% on room air, adequate by my interpretation.    COORDINATION OF CARE: At 1115 AM Discussed treatment plan with patient which includes cardizem, CXR, blood work, cardiac enzymes, EKG. Patient agrees.   Patient Vitals for the past 24 hrs:  BP Temp Temp src Pulse Resp SpO2 Height Weight  07/03/13 1405 138/61 mmHg - - 81 21 94 % - -  07/03/13 1315 124/66 mmHg - - 75 20 94 % - -  07/03/13 1101 105/64 mmHg 97.8 F (36.6 C) Oral 110 20 94 % - -  07/03/13 1100 - - - - - - 5\' 4"  (1.626 m) 150 lb (68.04 kg)     12:48 PM Reevaluation with update and discussion. After initial assessment and treatment, an updated evaluation reveals HR 65, looks like A. Flutter. She is comfortable. No c/o now. [name] L   1350- she has remained, rate controlled. Repeat EKG done is consistent with atrial flutter. The patient does not have short acting Toprol to use when necessary. She has been using the long-acting form when she needs extra rate control. She was to try using the short-acting form. She does not currently  have a cardiologist, in [place] Performed by: [name] Total critical care time: 40 minutes Critical care time was exclusive of separately billable procedures and treating other patients. Critical care was necessary to treat or prevent imminent or life-threatening deterioration. Critical care was time spent  personally by me on the following activities: development of treatment plan with patient and/or surrogate as well as nursing, discussions with consultants, evaluation of patient's response to treatment, examination of patient, obtaining history from patient or surrogate, ordering and performing treatments and interventions, ordering and review of laboratory studies, ordering and review of radiographic studies, pulse oximetry and re-evaluation of patient's condition.  Labs Review Labs Reviewed  BASIC METABOLIC PANEL - Abnormal; Notable for the following:    Potassium 3.4 (*)    Glucose, Bld 187 (*)    GFR calc non Af Amer 75 (*)    GFR calc Af Amer 87 (*)    All other components within normal limits  CBC WITH DIFFERENTIAL  TROPONIN I   Imaging Review Dg Chest Portable 1 View  07/03/2013   CLINICAL DATA:  Irregular heartbeat, dizziness  EXAM: PORTABLE CHEST - 1 VIEW  COMPARISON:  05/04/2013  FINDINGS: Cardiomediastinal silhouette is stable. No acute infiltrate or pleural effusion. No pulmonary edema. Stable left basilar scarring.  IMPRESSION: No active disease.   Electronically Signed   By: Lahoma Crocker M.D.   On: 07/03/2013 11:37   EKG Interpretation    Date/Time:  Wednesday July 03 2013 10:58:21 EST Ventricular Rate:  117 PR Interval:    QRS Duration: 78 QT Interval:  330 QTC Calculation: 460 R Axis:   26 Text Interpretation:  Atrial fibrillation with rapid ventricular response Nonspecific ST and T wave abnormality Abnormal ECG When compared with ECG of 04-May-2013 19:43, Atrial fibrillation has replaced Atrial flutter ST less depressed in Inferior  leads T wave inversion no longer evident in Inferior leads Confirmed by Harvest Stanco  MD, Takaya Hyslop ([id number]) on 07/03/2013 12:55:59 PM           Repeat EKG- 1315  Rate: 82  Rhythm: atrial flutter  QRS Axis: normal  PR and QT Intervals: Normal QT  ST/T Wave abnormalities: nonspecific T wave changes  PR and QRS Conduction Disutrbances:Normal QT  Narrative Interpretation:   Old EKG Reviewed: changes noted- rate slower compared to earlier today       Final diagnoses:  Atrial flutter with rapid ventricular response   MDM Atrial flutter initially uncontrolled rate, controlled with Cardizem drip. No evidence for ACS, metabolic instability, suspected infection or impending cardiovascular collapse.    Nursing Notes Reviewed/ Care Coordinated Applicable Imaging Reviewed Interpretation of Laboratory Data incorporated into ED treatment  The patient appears reasonably screened and/or stabilized for discharge and I doubt any other medical condition or other [facility] requiring further screening, evaluation, or treatment in the ED at this time prior to discharge.  Plan: Home Medications- add Metoprolol short-acting for episodes of palpitations; Home Treatments- rest, fluids; return here if the recommended treatment, does not improve the symptoms; Recommended follow up- PCP and Cardiology 1-2 weeks   I personally performed the services described in this documentation, which was scribed in my presence. The recorded information has been reviewed and is accurate.       Katherine Blade, MD 07/03/13 [phone]

## 2013-07-03 NOTE — ED Notes (Signed)
Pt c/o dizziness and rapid heart rate since yesterday. Denies pain.

## 2013-07-03 NOTE — Discharge Instructions (Signed)
Use the short-acting metoprolol if needed for episodes of palpitations. You can use it every 6 hours. If you need to use it more than twice, call your cardiologist, or return here.    Atrial Flutter Atrial flutter is a heart rhythm that can cause the heart to beat very fast (tachycardia). It originates in the upper chambers of the heart (atria). In atrial flutter, the top chambers of the heart (atria) often beat much faster than the bottom chambers of the heart (ventricles). Atrial flutter has a regular "saw toothed" appearance in an EKG readout. An EKG is a test that records the electrical activity of the heart. Atrial flutter can cause the heart to beat up to 150 beats per minute (BPM). Atrial flutter can either be short lived (paroxysmal) or permanent.  CAUSES  Causes of atrial flutter can be many. Some of these include:  Heart related issues:  Heart attack (myocardial infarction).  Heart failure.  Heart valve problems.  Poorly controlled high blood pressure (hypertension).  Afteropen heart surgery.  Lung related issues:  A blood clot in the lungs (pulmonary embolism).  Chronic obstructive pulmonary disease (COPD). Medications used to treat COPD can attribute to atrial flutter.  Other related causes:  Hyperthyroidism.  Caffeine.  Some decongestant cold medications.  Low electrolyte levels such as potassium or magnesium.  Cocaine. SYMPTOMS  An awareness of your heart beating rapidly (palpitations).  Shortness of breath.  Chest pain.  Low blood pressure (hypotension).  Dizziness or fainting. DIAGNOSIS  Different tests can be performed to diagnose atrial flutter.   An EKG.  Holter monitor. This is a 24 hour recording of your heart rhythm. You will also be given a diary. Write down all symptoms that you have and what you were doing at the time you experienced symptoms.  Cardiac event monitor. This small device can be worn for up to 30 days. When you have heart  symptoms, you will push a button on the device. This will then record your heart rhythm.  Echocardiogram. This is an imaging test to look at your heart. Your caregiver will look at your heart valves and the ventricles.  Stress Test. This test can help determine if the atrial flutter is related to exercise or if coronary artery disease is present.  Laboratory studies will look at certain blood levels like:  Complete blood count (CBC).  Potassium.  Magnesium.  Thyroid function. TREATMENT  Treatment of atrial flutter varies. A combination of therapies may be used or sometimes atrial flutter may need only 1 type of treatment.  Lab work: If your blood work, such as your electrolytes (potassium, magnesium) or your thyroid function tests are abnormal, your caregiver will treat them accordingly.  Medication:  There are several different types of medications that can convert your heart to a normal rhythm and prevent atrial flutter from reoccurring.  Nonsurgical procedures: Nonsurgical techniques may be used to control atrial flutter. Some examples include:  Cardioversion. This technique uses either drugs or an electrical shock to restore a normal heart rhythm:  Cardioversion drugs may be given through an intravenous (IV) line to help "reset" the heart rhythm.  In electrical cardioversion, your caregiver shocks your heart with electrical energy. This helps to reset the heartbeat to a normal rhythm.  Ablation. If atrial flutter is a persistent problem, an ablation may be needed. This procedure is done under mild sedation. High frequency radio-wave energy is used to destroy the area of heart tissue responsible for atrial flutter. [place]  CARE IF:   Dizziness.  Near fainting or fainting.  Shortness of breath.  Chest pain or pressure.  Sudden nausea or vomiting.  Profuse sweating. If you have the above symptoms, call your local emergency service immediately! Do not drive  yourself to the hospital. MAKE SURE YOU:   Understand these instructions.  Will watch your condition.  Will get help right away if you are not doing well or get worse. Document Released: 09/25/2008 Document Revised: 08/01/2011 Document Reviewed: 09/25/2008 [facility] Patient Information 2014 [place], [place].

## 2013-07-05 ENCOUNTER — Other Ambulatory Visit: Payer: Self-pay | Admitting: Family Medicine

## 2013-07-05 NOTE — Telephone Encounter (Signed)
Ok times 3 

## 2013-08-05 ENCOUNTER — Ambulatory Visit (INDEPENDENT_AMBULATORY_CARE_PROVIDER_SITE_OTHER): Payer: Medicare Other | Admitting: Family Medicine

## 2013-08-05 ENCOUNTER — Encounter: Payer: Self-pay | Admitting: Family Medicine

## 2013-08-05 VITALS — BP 110/78 | Ht 64.0 in | Wt 150.4 lb

## 2013-08-05 DIAGNOSIS — R7301 Impaired fasting glucose: Secondary | ICD-10-CM | POA: Insufficient documentation

## 2013-08-05 DIAGNOSIS — R634 Abnormal weight loss: Secondary | ICD-10-CM

## 2013-08-05 DIAGNOSIS — R739 Hyperglycemia, unspecified: Secondary | ICD-10-CM

## 2013-08-05 DIAGNOSIS — R7309 Other abnormal glucose: Secondary | ICD-10-CM

## 2013-08-05 LAB — POCT GLYCOSYLATED HEMOGLOBIN (HGB A1C): HEMOGLOBIN A1C: 4.6

## 2013-08-05 NOTE — Progress Notes (Signed)
   Subjective:    Patient ID: [name], female    DOB: [birth year], 80 y.o.   MRN: [id number]  Hyperlipidemia This is a chronic problem. The current episode started more than 1 year ago. The problem is controlled. There are no known factors aggravating her hyperlipidemia. She is currently on no antihyperlipidemic treatment. The current treatment provides significant improvement of lipids. There are no compliance problems.  There are no known risk factors for coronary artery disease.  Patient states that she has no concerns at this time.   Patient also has history atrial fib with rapid ventricular response. She she currently is on a beta blocker that she takes on a regular basis she also has a short acting beta blocker for which she takes in it causes her more trouble. She also takes a blood thinner and denies any rectal bleeding or vomiting of blood no bleeding issues.  She has had numerous blood work over the past year for which her sugar was slightly elevated we will check a hemoglobin A1c she denies excessive thirst urination.  Patient has lost a little bit await she states she eats less than she used to she thinks this is partly the cause of her issues. She just doesn't feel as hungry she used to be but she doesn't feel anything is wrong  Review of Systems Denies headaches chest pain shortness breath nausea vomiting diarrhea denies sweats chills.    Objective:   Physical Exam  Lungs are clear heart rate is controlled irregular beat pulse normal extremities no edema skin warm dry neck no masses neurologic grossly normal      Assessment & Plan:  #1 hyperlipidemia mild-does not tolerate statins #2 atrial fibrillation rate is controlled on blood thinner continue current measure #3 impaired fasting glucose hemoglobin A1c looks normal  Recheck in 4 months, lab work will be needed in July/August

## 2013-09-06 ENCOUNTER — Ambulatory Visit (INDEPENDENT_AMBULATORY_CARE_PROVIDER_SITE_OTHER): Payer: Medicare Other | Admitting: Family Medicine

## 2013-09-06 ENCOUNTER — Encounter: Payer: Self-pay | Admitting: Family Medicine

## 2013-09-06 VITALS — BP 100/60 | Temp 98.4°F | Ht 64.0 in | Wt 150.4 lb

## 2013-09-06 DIAGNOSIS — R5383 Other fatigue: Secondary | ICD-10-CM

## 2013-09-06 DIAGNOSIS — F32A Depression, unspecified: Secondary | ICD-10-CM

## 2013-09-06 DIAGNOSIS — R5381 Other malaise: Secondary | ICD-10-CM

## 2013-09-06 DIAGNOSIS — N289 Disorder of kidney and ureter, unspecified: Secondary | ICD-10-CM

## 2013-09-06 DIAGNOSIS — N390 Urinary tract infection, site not specified: Secondary | ICD-10-CM

## 2013-09-06 DIAGNOSIS — F329 Major depressive disorder, single episode, unspecified: Secondary | ICD-10-CM

## 2013-09-06 DIAGNOSIS — F3289 Other specified depressive episodes: Secondary | ICD-10-CM

## 2013-09-06 DIAGNOSIS — M6281 Muscle weakness (generalized): Secondary | ICD-10-CM

## 2013-09-06 LAB — POCT URINALYSIS DIPSTICK
Spec Grav, UA: 1.015
pH, UA: 5

## 2013-09-06 MED ORDER — SERTRALINE HCL 50 MG PO TABS: 50.0000 mg | ORAL_TABLET | Freq: Every day | ORAL | Status: DC

## 2013-09-06 MED ORDER — CIPROFLOXACIN HCL 250 MG PO TABS
250.0000 mg | ORAL_TABLET | Freq: Two times a day (BID) | ORAL | Status: DC
Start: 2013-09-06 — End: 2013-09-26

## 2013-09-06 NOTE — Progress Notes (Signed)
   Subjective:    Patient ID: [name], female    DOB: 24-Feb-1924, 80 y.o.   MRN: [id number]  Urinary Tract Infection  This is a new problem. The problem occurs every urination. The problem has been gradually worsening. The quality of the pain is described as burning. The pain is moderate. There has been no fever. Associated symptoms include flank pain, frequency and urgency. She has tried increased fluids (cranberry juice) for the symptoms. The treatment provided no relief.   Patient states she has a rash on her face that has been present for a long time now.  She finds herself feeling run down fatigued tired she also finds herself feeling blue and sad and not wanting to do stuff  Review of Systems  Genitourinary: Positive for urgency, frequency and flank pain.   denies high fevers cough wheezing difficulty breathing     Objective:   Physical Exam  Her lungs clear hearts your regular but rate is controlled extremities no edema.     Assessment & Plan:  Severe fatigue tiredness this been going on for months probably related into her multiple health conditions plus her age  UTI antibiotics prescribed culture pending  Mild depression Zoloft as prescribed followup in several weeks

## 2013-09-09 LAB — URINE CULTURE

## 2013-09-10 ENCOUNTER — Other Ambulatory Visit: Payer: Self-pay | Admitting: Family Medicine

## 2013-09-10 LAB — BASIC METABOLIC PANEL
BUN: 14 mg/dL (ref 6–23)
CO2: 25 mEq/L (ref 19–32)
Calcium: 9.2 mg/dL (ref 8.4–10.5)
Chloride: 107 mEq/L (ref 96–112)
Creat: 0.64 mg/dL (ref 0.50–1.10)
Glucose, Bld: 100 mg/dL — ABNORMAL HIGH (ref 70–99)
Potassium: 4.1 mEq/L (ref 3.5–5.3)
SODIUM: 143 meq/L (ref 135–145)

## 2013-09-10 LAB — TSH: TSH: 2.804 u[IU]/mL (ref 0.350–4.500)

## 2013-09-10 LAB — SEDIMENTATION RATE: Sed Rate: 8 mm/hr (ref 0–22)

## 2013-09-11 LAB — URINE CULTURE
COLONY COUNT: NO GROWTH
Organism ID, Bacteria: NO GROWTH

## 2013-09-26 ENCOUNTER — Ambulatory Visit (INDEPENDENT_AMBULATORY_CARE_PROVIDER_SITE_OTHER): Payer: Medicare Other | Admitting: Family Medicine

## 2013-09-26 ENCOUNTER — Encounter: Payer: Self-pay | Admitting: Family Medicine

## 2013-09-26 VITALS — BP 128/70 | Temp 98.5°F | Ht 64.0 in | Wt 150.0 lb

## 2013-09-26 DIAGNOSIS — R350 Frequency of micturition: Secondary | ICD-10-CM

## 2013-09-26 DIAGNOSIS — N390 Urinary tract infection, site not specified: Secondary | ICD-10-CM

## 2013-09-26 LAB — POCT URINALYSIS DIPSTICK
Nitrite, UA: POSITIVE
SPEC GRAV UA: 1.02
pH, UA: 6

## 2013-09-26 MED ORDER — CIPROFLOXACIN HCL 250 MG PO TABS: 250.0000 mg | ORAL_TABLET | Freq: Two times a day (BID) | ORAL | Status: DC

## 2013-09-26 NOTE — Progress Notes (Signed)
   Subjective:    Patient ID: [name], female    DOB: 01/31/1924, 80 y.o.   MRN: [id number]  HPI Comments: Abdominal pain  Pain when wiping    Urinary Frequency  This is a chronic problem. The problem occurs every urination. The problem has been unchanged. The quality of the pain is described as aching. There has been no fever. Associated symptoms include flank pain and frequency. Pertinent negatives include no nausea. Treatments tried: Cipro.   Still fatigue, this is a chronic issue. She is content numerous testing. Nothing specific that she'll regarding source of this.  Review of Systems  Gastrointestinal: Positive for abdominal pain. Negative for nausea.  Genitourinary: Positive for dysuria, frequency and flank pain. Negative for enuresis.       Objective:   Physical Exam  Vitals reviewed. Constitutional: She appears well-nourished. No distress.  Cardiovascular: Normal rate, regular rhythm and normal heart sounds.   No murmur heard. Pulmonary/Chest: Effort normal and breath sounds normal. No respiratory distress.  Abdominal: Soft. There is tenderness.  Musculoskeletal: She exhibits no edema.  Lymphadenopathy:    She has no cervical adenopathy.  Neurological: She is alert. She exhibits normal muscle tone.  Psychiatric: Her behavior is normal.          Assessment & Plan:  Referral to urology for frequent UTI. Cipro 250 BID for 14 days. Recheck in 2 months for check up.   It should be noted that her moods are doing better with the medication I encourage her to continue it.

## 2013-09-29 LAB — URINE CULTURE

## 2013-10-04 ENCOUNTER — Encounter: Payer: Self-pay | Admitting: Family Medicine

## 2013-11-27 ENCOUNTER — Ambulatory Visit (INDEPENDENT_AMBULATORY_CARE_PROVIDER_SITE_OTHER): Payer: Medicare Other | Admitting: Family Medicine

## 2013-11-27 ENCOUNTER — Encounter: Payer: Self-pay | Admitting: Family Medicine

## 2013-11-27 VITALS — BP 120/88 | Ht 64.0 in | Wt 151.5 lb

## 2013-11-27 DIAGNOSIS — R109 Unspecified abdominal pain: Secondary | ICD-10-CM

## 2013-11-27 DIAGNOSIS — F419 Anxiety disorder, unspecified: Secondary | ICD-10-CM

## 2013-11-27 DIAGNOSIS — IMO0001 Reserved for inherently not codable concepts without codable children: Secondary | ICD-10-CM

## 2013-11-27 DIAGNOSIS — F411 Generalized anxiety disorder: Secondary | ICD-10-CM

## 2013-11-27 MED ORDER — RANITIDINE HCL 150 MG PO TABS: 150.0000 mg | ORAL_TABLET | Freq: Two times a day (BID) | ORAL | Status: DC

## 2013-11-27 MED ORDER — HYDROCODONE-ACETAMINOPHEN 5-325 MG PO TABS: 1.0000 | ORAL_TABLET | Freq: Four times a day (QID) | ORAL | Status: DC | PRN

## 2013-11-27 NOTE — Patient Instructions (Signed)
Zantac- take one twice a day come back in 1 month to see how your stomach is, sooner if needed

## 2013-11-27 NOTE — Progress Notes (Signed)
   Subjective:    Patient ID: [name], female    DOB: 1924/04/06, 80 y.o.   MRN: [id number]  HPI Patient is here today for her follow up visit on anxiety/check up. Patient is taking medications daily. Patient states that she wants a prescription for Zantac for acid reflux. Patient complains of pain all over body. She wants to discuss the side effects of Zoloft.    Review of Systems  Constitutional: Positive for fatigue. Negative for fever, activity change and appetite change.  HENT: Negative for congestion.   Respiratory: Negative for apnea, cough, choking and shortness of breath.   Cardiovascular: Negative for chest pain and leg swelling.  Gastrointestinal: Negative for abdominal pain.  Endocrine: Negative for polydipsia and polyphagia.  Genitourinary: Negative for frequency.  Neurological: Negative for weakness.  Psychiatric/Behavioral: Negative for confusion.   she also relates some minimal epigastric discomfort that comes and goes along with reflux symptoms    Objective:   Physical Exam  Vitals reviewed. Constitutional: She appears well-nourished. No distress.  Cardiovascular: Normal rate and normal heart sounds.   No murmur heard. Pulmonary/Chest: Effort normal and breath sounds normal. No respiratory distress.  Musculoskeletal: She exhibits no edema.  Lymphadenopathy:    She has no cervical adenopathy.  Neurological: She is alert. She exhibits normal muscle tone.  Psychiatric: Her behavior is normal.   She does not have any significant tenderness in the abdomen.       Assessment & Plan:  1. Myalgia and myositis Patient relates that she has the ongoing discomfort this been thoroughly checked in the past including sedimentation rate. She does not want to see a rheumatologist she states that she will use Tylenol during the day I prescribed her hydrocodone for the pain cautioned drowsiness  2. Abdominal pain, unspecified site She relates some gastritis or reflux  symptoms we will try Zantac followup in 30 days if she is not doing well at that point in time referral to GI  3. Anxiety Patient states that she feels her moods are doing good she does not want to take any type of nerve medication or Zoloft she wants to stop it. We will see how things are going in a month

## 2013-12-04 ENCOUNTER — Other Ambulatory Visit: Payer: Self-pay | Admitting: Cardiovascular Disease

## 2013-12-05 NOTE — Telephone Encounter (Signed)
Rx was sent to pharmacy electronically. 

## 2013-12-07 ENCOUNTER — Encounter (HOSPITAL_COMMUNITY): Payer: Self-pay | Admitting: Emergency Medicine

## 2013-12-07 ENCOUNTER — Emergency Department (HOSPITAL_COMMUNITY)
Admission: EM | Admit: 2013-12-07 | Discharge: 2013-12-07 | Disposition: A | Discharge status: Home / Self Care | Payer: Medicare Other | Attending: Emergency Medicine | Admitting: Emergency Medicine

## 2013-12-07 DIAGNOSIS — T63444A Toxic effect of venom of bees, undetermined, initial encounter: Secondary | ICD-10-CM

## 2013-12-07 DIAGNOSIS — Y9389 Activity, other specified: Secondary | ICD-10-CM | POA: Insufficient documentation

## 2013-12-07 DIAGNOSIS — K219 Gastro-esophageal reflux disease without esophagitis: Secondary | ICD-10-CM | POA: Insufficient documentation

## 2013-12-07 DIAGNOSIS — Z859 Personal history of malignant neoplasm, unspecified: Secondary | ICD-10-CM | POA: Insufficient documentation

## 2013-12-07 DIAGNOSIS — Z79899 Other long term (current) drug therapy: Secondary | ICD-10-CM | POA: Insufficient documentation

## 2013-12-07 DIAGNOSIS — Z7901 Long term (current) use of anticoagulants: Secondary | ICD-10-CM | POA: Insufficient documentation

## 2013-12-07 DIAGNOSIS — T63461A Toxic effect of venom of wasps, accidental (unintentional), initial encounter: Secondary | ICD-10-CM | POA: Insufficient documentation

## 2013-12-07 DIAGNOSIS — T6391XA Toxic effect of contact with unspecified venomous animal, accidental (unintentional), initial encounter: Secondary | ICD-10-CM | POA: Insufficient documentation

## 2013-12-07 DIAGNOSIS — Y929 Unspecified place or not applicable: Secondary | ICD-10-CM | POA: Insufficient documentation

## 2013-12-07 DIAGNOSIS — G8929 Other chronic pain: Secondary | ICD-10-CM | POA: Insufficient documentation

## 2013-12-07 DIAGNOSIS — I10 Essential (primary) hypertension: Secondary | ICD-10-CM | POA: Insufficient documentation

## 2013-12-07 DIAGNOSIS — Z8739 Personal history of other diseases of the musculoskeletal system and connective tissue: Secondary | ICD-10-CM | POA: Insufficient documentation

## 2013-12-07 DIAGNOSIS — I4891 Unspecified atrial fibrillation: Secondary | ICD-10-CM | POA: Insufficient documentation

## 2013-12-07 DIAGNOSIS — Z880 Allergy status to penicillin: Secondary | ICD-10-CM | POA: Insufficient documentation

## 2013-12-07 DIAGNOSIS — Z872 Personal history of diseases of the skin and subcutaneous tissue: Secondary | ICD-10-CM | POA: Insufficient documentation

## 2013-12-07 MED ORDER — METHYLPREDNISOLONE SODIUM SUCC 125 MG IJ SOLR
80.0000 mg | Freq: Once | INTRAMUSCULAR | Status: AC
  Administered 2013-12-07: 80 mg via INTRAVENOUS
  Filled 2013-12-07: qty 2

## 2013-12-07 MED ORDER — FAMOTIDINE IN NACL 20-0.9 MG/50ML-% IV SOLN
20.0000 mg | Freq: Once | INTRAVENOUS | Status: AC
  Administered 2013-12-07: 20 mg via INTRAVENOUS
  Filled 2013-12-07: qty 50

## 2013-12-07 MED ORDER — DIPHENHYDRAMINE HCL 50 MG/ML IJ SOLN
25.0000 mg | Freq: Once | INTRAMUSCULAR | Status: AC
  Administered 2013-12-07: 25 mg via INTRAVENOUS
  Filled 2013-12-07: qty 1

## 2013-12-07 MED ORDER — METHYLPREDNISOLONE 4 MG PO KIT: PACK | ORAL | Status: DC

## 2013-12-07 NOTE — ED Notes (Signed)
EDP at bedside  

## 2013-12-07 NOTE — Discharge Instructions (Signed)

## 2013-12-07 NOTE — ED Provider Notes (Signed)
CSN: [id number]     Arrival date & time 12/07/13  1946 History   First MD Initiated Contact with Patient 12/07/13 1953     This chart was scribed for Orpah Greek, * by Forrestine Him, ED Scribe. This patient was seen in room APA10/APA10 and the patient's care was started 7:56 PM.   No chief complaint on file.  HPI  HPI Comments: [name] is a 80 y.o. female with a PMHx of HTN, A-Fib, GERD, hyperlipidemia, and osteopenia who presents to the Emergency Department complaining of several insect stings to the upper extremities bilaterally and R flank onset just prior to arrival. She admits to a known allergy to bees and states she was stung several times by yellow jackets. She has noted areas of erythema and swelling to all sites. She denies taking any OTC antihistamines prior to arrival. At this time she denies any fever, chills, trouble swallowing, or SOB. She has no other pertinent past medical history. No other concerns this visit.   Past Medical History  Diagnosis Date  . Hypertension   . Atrial fibrillation   . Cancer     left side  . GERD (gastroesophageal reflux disease)   . Hyperlipidemia   . Osteoporosis   . Osteopenia   . Edema   . Chronic back pain   . Abdominal pain, chronic, right lower quadrant     "ever since I had my appendix removed"   Past Surgical History  Procedure Laterality Date  . Breast surgery    . Appendectomy    . Hernia repair    . Colon surgery     Family History  Problem Relation Age of Onset  . Heart disease    . Arthritis    . Cancer    . Diabetes Mother   . Hyperlipidemia Brother    History  Substance Use Topics  . Smoking status: Never Smoker   . Smokeless tobacco: Not on file  . Alcohol Use: No   OB History   Grav Para Term Preterm Abortions TAB SAB Ect Mult Living   2 1   1  1   1      Review of Systems  Constitutional: Negative for fever and chills.  HENT: Negative for trouble swallowing.   Respiratory: Negative for  shortness of breath.   Skin: Positive for wound.  Psychiatric/Behavioral: Negative for confusion.      Allergies  Amoxicillin; Cephalosporins; Lipitor; Lovenox; Morphine and related; Penicillins; and Sulfa antibiotics  Home Medications   Prior to Admission medications   Medication Sig Start Date End Date Taking? Authorizing Provider  ALPRAZolam Duanne Moron) 0.5 MG tablet Take 0.5 mg by mouth at bedtime as needed for sleep.   Yes Historical Provider, MD  HYDROcodone-acetaminophen (NORCO/VICODIN) 5-325 MG per tablet Take 1 tablet by mouth every 6 (six) hours as needed. Use infrequently 11/27/13  Yes [name], MD  metoprolol succinate (TOPROL XL) 25 MG 24 hr tablet Take 1 tablet (25 mg total) by mouth daily. 03/18/13 03/18/14 Yes [name], MD  Rivaroxaban (XARELTO) 15 MG TABS tablet Take 1 tablet (15 mg total) by mouth daily. Take with food. <please make appointment for refills>   Yes Sanda Klein, MD  methylPREDNISolone (MEDROL DOSEPAK) 4 MG tablet As directed 12/07/13   Orpah Greek, MD  omeprazole (PRILOSEC) 20 MG capsule Take 20 mg by mouth daily as needed (for heartburn/ acid reflux).     Historical Provider, MD   Triage Vitals: BP  133/53  Pulse 80  Temp(Src) 99.6 F (37.6 C) (Oral)  Resp 20  Ht 5\' 4"  (1.626 m)  Wt 150 lb (68.04 kg)  BMI 25.73 kg/m2  SpO2 100%   Physical Exam  Constitutional: She is oriented to person, place, and time. She appears well-developed and well-nourished. No distress.  HENT:  Head: Normocephalic and atraumatic.  Right Ear: Hearing normal.  Left Ear: Hearing normal.  Nose: Nose normal.  Mouth/Throat: Oropharynx is clear and moist and mucous membranes are normal.  Eyes: Conjunctivae and EOM are normal. Pupils are equal, round, and reactive to light.  Neck: Normal range of motion. Neck supple.  Cardiovascular: Regular rhythm, S1 normal and S2 normal.  Exam reveals no gallop and no friction rub.   No murmur heard. Pulmonary/Chest:  Effort normal and breath sounds normal. No respiratory distress. She exhibits no tenderness.  Abdominal: Soft. Normal appearance and bowel sounds are normal. There is no hepatosplenomegaly. There is no tenderness. There is no rebound, no guarding, no tenderness at McBurney's point and negative Murphy's sign. No hernia.  Musculoskeletal: Normal range of motion.  Neurological: She is alert and oriented to person, place, and time. She has normal strength. No cranial nerve deficit or sensory deficit. Coordination normal. GCS eye subscore is 4. GCS verbal subscore is 5. GCS motor subscore is 6.  Skin: Skin is warm, dry and intact. No rash noted. No cyanosis.  Several raised erythematous lesions to upper extremities bilaterally and R flank constant with yellow jacket stings  Psychiatric: She has a normal mood and affect. Her speech is normal and behavior is normal. Thought content normal.    ED Course  Procedures (including critical care time)  DIAGNOSTIC STUDIES: Oxygen Saturation is 100% on RA, Normal by my interpretation.    COORDINATION OF CARE: 8:00 PM-Discussed treatment plan with pt at bedside and pt agreed to plan.     Labs Review Labs Reviewed - No data to display  Imaging Review No results found.   EKG Interpretation None      MDM   Final diagnoses:  Local reaction to bee sting, undetermined intent, initial encounter   Patient presents to ER after multiple bee stings. Patient does have isolated local reactions. She has redness and swelling at the sites of bee stings, but does not have any systemic reaction. Vital signs are normal. The patient was administered Benadryl, Pepcid, Solu-Medrol and monitored for a period of time. Her itching at these sites has improved there has not been any extension. Patient is appropriate for discharge, continue Medrol Dosepak and Benadryl as needed. Return if her symptoms worsen.  I personally performed the services described in this  documentation, which was scribed in my presence. The recorded information has been reviewed and is accurate.    Orpah Greek, MD 12/07/13 [phone]

## 2013-12-07 NOTE — ED Notes (Signed)
Pt states she was stung multiple times by yellow jackets. Pt has bees flying out of her clothes while in triage. Pt states she is allergic to bees.

## 2013-12-27 ENCOUNTER — Encounter: Payer: Self-pay | Admitting: Family Medicine

## 2013-12-27 ENCOUNTER — Ambulatory Visit (INDEPENDENT_AMBULATORY_CARE_PROVIDER_SITE_OTHER): Payer: Medicare Other | Admitting: Family Medicine

## 2013-12-27 VITALS — BP 130/80 | Ht 64.0 in | Wt 154.0 lb

## 2013-12-27 DIAGNOSIS — M545 Low back pain, unspecified: Secondary | ICD-10-CM

## 2013-12-27 DIAGNOSIS — Z23 Encounter for immunization: Secondary | ICD-10-CM

## 2013-12-27 DIAGNOSIS — R42 Dizziness and giddiness: Secondary | ICD-10-CM

## 2013-12-27 NOTE — Progress Notes (Signed)
   Subjective:    Patient ID: [name], female    DOB: 10-13-1923, 80 y.o.   MRN: [id number]  Dizziness This is a recurrent problem. The current episode started 1 to 4 weeks ago. The problem occurs every several days. The problem has been unchanged. Associated symptoms include fatigue. Pertinent negatives include no coughing, diaphoresis, fever, headaches, nausea, neck pain, numbness, rash, sore throat, vertigo, visual change or weakness. The symptoms are aggravated by walking and standing. She has tried nothing for the symptoms. The treatment provided no relief.   Patient is here today for a follow up visit for abdominal pain. Patient states that she is still having pain in her sides, back and hips. Patient states she still having dizzy spells also. Taking Zantac 150 mg daily as prescribed.  Was seen at ER last month for bee stings. Patient states that she has no other concerns at this time.    Review of Systems  Constitutional: Positive for fatigue. Negative for fever and diaphoresis.  HENT: Negative for sore throat.   Respiratory: Negative for cough.   Gastrointestinal: Negative for nausea.  Musculoskeletal: Negative for neck pain.  Skin: Negative for rash.  Neurological: Positive for dizziness. Negative for vertigo, weakness, numbness and headaches.       Objective:   Physical Exam  Vitals reviewed. Constitutional: She appears well-nourished. No distress.  Cardiovascular: Normal rate and normal heart sounds.   No murmur heard. Pulmonary/Chest: Effort normal and breath sounds normal. No respiratory distress.  Musculoskeletal: She exhibits no edema.  Lymphadenopathy:    She has no cervical adenopathy.  Neurological: She is alert. She exhibits normal muscle tone.  Psychiatric: Her behavior is normal.          Assessment & Plan:  #1 dizziness-I. feel this is [place] more positional dizziness related to her age orthostatics are negative I find no unilateral findings  consistent with a stroke. I've instructed the patient continue on with her current regimen and to followup if ongoing troubles  #2 chronic low back pain hydrocodone when necessary cautioned drowsiness she uses it rarely she denies any abdominal pain currently she denies any sweats or chills she does relate pain in her lower back and occasionally in her hips it depends on what she does

## 2014-01-08 ENCOUNTER — Emergency Department (HOSPITAL_COMMUNITY): Payer: Medicare Other

## 2014-01-08 ENCOUNTER — Emergency Department (HOSPITAL_COMMUNITY)
Admission: EM | Admit: 2014-01-08 | Discharge: 2014-01-08 | Disposition: A | Discharge status: Home / Self Care | Payer: Medicare Other | Attending: Emergency Medicine | Admitting: Emergency Medicine

## 2014-01-08 ENCOUNTER — Encounter (HOSPITAL_COMMUNITY): Payer: Self-pay | Admitting: Emergency Medicine

## 2014-01-08 DIAGNOSIS — K219 Gastro-esophageal reflux disease without esophagitis: Secondary | ICD-10-CM | POA: Insufficient documentation

## 2014-01-08 DIAGNOSIS — I4891 Unspecified atrial fibrillation: Secondary | ICD-10-CM | POA: Insufficient documentation

## 2014-01-08 DIAGNOSIS — I10 Essential (primary) hypertension: Secondary | ICD-10-CM | POA: Insufficient documentation

## 2014-01-08 DIAGNOSIS — Z7901 Long term (current) use of anticoagulants: Secondary | ICD-10-CM | POA: Insufficient documentation

## 2014-01-08 DIAGNOSIS — G8929 Other chronic pain: Secondary | ICD-10-CM | POA: Insufficient documentation

## 2014-01-08 DIAGNOSIS — Z859 Personal history of malignant neoplasm, unspecified: Secondary | ICD-10-CM | POA: Insufficient documentation

## 2014-01-08 DIAGNOSIS — R002 Palpitations: Secondary | ICD-10-CM | POA: Insufficient documentation

## 2014-01-08 DIAGNOSIS — Z8739 Personal history of other diseases of the musculoskeletal system and connective tissue: Secondary | ICD-10-CM | POA: Insufficient documentation

## 2014-01-08 DIAGNOSIS — F411 Generalized anxiety disorder: Secondary | ICD-10-CM | POA: Diagnosis present

## 2014-01-08 DIAGNOSIS — Z880 Allergy status to penicillin: Secondary | ICD-10-CM | POA: Insufficient documentation

## 2014-01-08 DIAGNOSIS — Z79899 Other long term (current) drug therapy: Secondary | ICD-10-CM | POA: Diagnosis not present

## 2014-01-08 DIAGNOSIS — R0602 Shortness of breath: Secondary | ICD-10-CM | POA: Insufficient documentation

## 2014-01-08 DIAGNOSIS — F419 Anxiety disorder, unspecified: Secondary | ICD-10-CM

## 2014-01-08 DIAGNOSIS — F4310 Post-traumatic stress disorder, unspecified: Secondary | ICD-10-CM | POA: Insufficient documentation

## 2014-01-08 LAB — PRO B NATRIURETIC PEPTIDE: Pro B Natriuretic peptide (BNP): 1672 pg/mL — ABNORMAL HIGH (ref 0–450)

## 2014-01-08 LAB — CBC WITH DIFFERENTIAL/PLATELET
Basophils Absolute: 0 10*3/uL (ref 0.0–0.1)
Basophils Relative: 0 % (ref 0–1)
Eosinophils Absolute: 0.1 10*3/uL (ref 0.0–0.7)
Eosinophils Relative: 1 % (ref 0–5)
HCT: 41.3 % (ref 36.0–46.0)
HEMOGLOBIN: 13.7 g/dL (ref 12.0–15.0)
LYMPHS ABS: 2.4 10*3/uL (ref 0.7–4.0)
Lymphocytes Relative: 35 % (ref 12–46)
MCH: 28.5 pg (ref 26.0–34.0)
MCHC: 33.2 g/dL (ref 30.0–36.0)
MCV: 85.9 fL (ref 78.0–100.0)
MONOS PCT: 8 % (ref 3–12)
Monocytes Absolute: 0.5 10*3/uL (ref 0.1–1.0)
NEUTROS PCT: 56 % (ref 43–77)
Neutro Abs: 3.9 10*3/uL (ref 1.7–7.7)
Platelets: 311 10*3/uL (ref 150–400)
RBC: 4.81 MIL/uL (ref 3.87–5.11)
RDW: 12.9 % (ref 11.5–15.5)
WBC: 7 10*3/uL (ref 4.0–10.5)

## 2014-01-08 LAB — BASIC METABOLIC PANEL
Anion gap: 15 (ref 5–15)
BUN: 14 mg/dL (ref 6–23)
CO2: 24 mEq/L (ref 19–32)
Calcium: 9.4 mg/dL (ref 8.4–10.5)
Chloride: 104 mEq/L (ref 96–112)
Creatinine, Ser: 0.68 mg/dL (ref 0.50–1.10)
GFR calc Af Amer: 88 mL/min — ABNORMAL LOW (ref >90)
GFR calc non Af Amer: 76 mL/min — ABNORMAL LOW (ref >90)
Glucose, Bld: 111 mg/dL — ABNORMAL HIGH (ref 70–99)
Potassium: 3.9 mEq/L (ref 3.7–5.3)
Sodium: 143 mEq/L (ref 137–147)

## 2014-01-08 LAB — TROPONIN I

## 2014-01-08 LAB — TSH: TSH: 3.66 u[IU]/mL (ref 0.350–4.500)

## 2014-01-08 NOTE — Clinical Social Work Note (Signed)
CSW received consult for home health. Notified CM and will sign off.  [name], [place]

## 2014-01-08 NOTE — ED Provider Notes (Signed)
CSN: [id number]     Arrival date & time 01/08/14  1761 History   First MD Initiated Contact with Patient 01/08/14 0932     Chief Complaint  Patient presents with  . Anxiety     (Consider location/radiation/quality/duration/timing/severity/associated sxs/prior Treatment) HPI  This is an 80 year old female with a history of hypertension, atrial fibrillation, hyperlipidemia who presents with panic attacks. Patient reports that she has a history of panic attacks but has not had any problem with them recently. She states in the last 2-3 days, she has had increasing episodes. She states that she gets very anxious and begins to have palpitations and shortness of breath. Last episode was this morning. She states "it was worse than normal." She feels like sometimes her heart races.  She did take half a dose of metoprolol this morning. On evaluation, she reports resolution of symptoms. She states "I always feel better when I'm around people." She does endorse increasing stressors at home including trying to find a new place to live to be closer to her son as well as being stressed about her son's chronic medical conditions.  She does take Xanax nightly.  Past Medical History  Diagnosis Date  . Hypertension   . Atrial fibrillation   . Cancer     left side  . GERD (gastroesophageal reflux disease)   . Hyperlipidemia   . Osteoporosis   . Osteopenia   . Edema   . Chronic back pain   . Abdominal pain, chronic, right lower quadrant     "ever since I had my appendix removed"   Past Surgical History  Procedure Laterality Date  . Breast surgery    . Appendectomy    . Hernia repair    . Colon surgery     Family History  Problem Relation Age of Onset  . Heart disease    . Arthritis    . Cancer    . Diabetes Mother   . Hyperlipidemia Brother    History  Substance Use Topics  . Smoking status: Never Smoker   . Smokeless tobacco: Not on file  . Alcohol Use: No   OB History   Grav Para Term  Preterm Abortions TAB SAB Ect Mult Living   2 1   1  1   1      Review of Systems  Constitutional: Negative for fever.  Respiratory: Positive for shortness of breath. Negative for cough and chest tightness.   Cardiovascular: Positive for palpitations. Negative for chest pain and leg swelling.  Gastrointestinal: Negative for nausea, vomiting and abdominal pain.  Genitourinary: Negative for dysuria.  Neurological: Negative for headaches.  Psychiatric/Behavioral: Negative for confusion. The patient is nervous/anxious.   All other systems reviewed and are negative.     Allergies  Amoxicillin; Cephalosporins; Lipitor; Lovenox; Morphine and related; Penicillins; and Sulfa antibiotics  Home Medications   Prior to Admission medications   Medication Sig Start Date End Date Taking? Authorizing Provider  ALPRAZolam Duanne Moron) 0.5 MG tablet Take 0.5 mg by mouth at bedtime as needed for anxiety.    Yes Historical Provider, MD  metoprolol succinate (TOPROL XL) 25 MG 24 hr tablet Take 1 tablet (25 mg total) by mouth daily. 03/18/13 03/18/14 Yes [name], MD  ranitidine (ZANTAC) 150 MG tablet Take 150 mg by mouth daily as needed for heartburn.  11/27/13  Yes Historical Provider, MD  Rivaroxaban (XARELTO) 15 MG TABS tablet Take 1 tablet (15 mg total) by mouth daily. Take with food. <please make  appointment for refills>   Yes Mihai Croitoru, MD   BP 136/100  Pulse 99  Temp(Src) 98.1 F (36.7 C) (Oral)  Resp 13  Ht 5\' 4"  (1.626 m)  Wt 150 lb (68.04 kg)  BMI 25.73 kg/m2  SpO2 98% Physical Exam  Nursing note and vitals reviewed. Constitutional: She is oriented to person, place, and time. No distress.  Elderly  HENT:  Head: Normocephalic and atraumatic.  Mouth/Throat: Oropharynx is clear and moist.  Eyes: Pupils are equal, round, and reactive to light.  Cardiovascular: Normal rate and normal heart sounds.   No murmur heard. Irregular rhythm  Pulmonary/Chest: Effort normal and breath sounds  normal. No respiratory distress. She has no wheezes.  Abdominal: Soft. Bowel sounds are normal. There is no tenderness. There is no rebound and no guarding.  Musculoskeletal: She exhibits no edema.  Neurological: She is alert and oriented to person, place, and time.  Skin: Skin is warm and dry.  Psychiatric:  Anxious appearing    ED Course  Procedures (including critical care time) Labs Review Labs Reviewed  BASIC METABOLIC PANEL - Abnormal; Notable for the following:    Glucose, Bld 111 (*)    GFR calc non Af Amer 76 (*)    GFR calc Af Amer 88 (*)    All other components within normal limits  PRO B NATRIURETIC PEPTIDE - Abnormal; Notable for the following:    Pro B Natriuretic peptide (BNP) 1672.0 (*)    All other components within normal limits  CBC WITH DIFFERENTIAL  TROPONIN I  TSH    Imaging Review Dg Chest 2 View  01/08/2014   CLINICAL DATA:  Dizziness and anxiety.  EXAM: CHEST  2 VIEW  COMPARISON:  07/03/2013.  FINDINGS: Mediastinum and hilar structures normal. Left base subsegmental atelectasis and or mild infiltrate. Component of scarring may be present . No pleural effusion or pneumothorax. Cardiomegaly with normal pulmonary vascularity. No acute osseous abnormality. Degenerative changes thoracic spine.  IMPRESSION: 1. Left base subsegmental atelectasis and/or mild infiltrates. A component of pleural-parenchymal scarring may be present. 2. Cardiomegaly, no CHF.   Electronically Signed   By: Marcello Moores  Register   On: 01/08/2014 10:43     EKG Interpretation   Date/Time:  Wednesday January 08 2014 10:10:03 EDT Ventricular Rate:  88 PR Interval:    QRS Duration: 78 QT Interval:  320 QTC Calculation: 387 R Axis:   8 Text Interpretation:  Atrial fibrillation similar to prior Confirmed by  HORTON  MD, Millard ([id number]) on 01/08/2014 10:18:28 AM      MDM   Final diagnoses:  Anxiety   Patient presents with increasing anxiety. Reports her anxiety attacks she has  palpitations and shortness of breath. She currently symptom-free. Basic labwork is reassuring and vital signs are stable. She is in atrial fibrillation that is rate controlled. BNP is elevated the chest x-ray shows no evidence of CHF and lung sounds are clear.  The patient has had increasing stressors causing increases and anxiety. Social work evaluated the patient and offered her resources including day visits to an adult center.  Patient is not interested in an assisted-living. I placed orders for home health and social work evaluations at home. TSH is pending. The patient is stable for discharge home. We'll defer back to primary care physician regarding changes and pain medication.  After history, exam, and medical workup I feel the patient has been appropriately medically screened and is safe for discharge home. Pertinent diagnoses were discussed with the patient.  Patient was given return precautions.    Merryl Hacker, MD 01/08/14 1235

## 2014-01-08 NOTE — Discharge Instructions (Signed)

## 2014-01-08 NOTE — ED Notes (Signed)
Pt states she has a history of panic attacks. States the past few days they have gotten worse. States if she had somebody to talk to she would probably be ok

## 2014-01-09 ENCOUNTER — Other Ambulatory Visit: Payer: Self-pay | Admitting: *Deleted

## 2014-01-14 ENCOUNTER — Ambulatory Visit (INDEPENDENT_AMBULATORY_CARE_PROVIDER_SITE_OTHER): Payer: Medicare Other | Admitting: Family Medicine

## 2014-01-14 ENCOUNTER — Encounter: Payer: Self-pay | Admitting: Family Medicine

## 2014-01-14 VITALS — BP 134/86 | Ht 64.0 in | Wt 153.0 lb

## 2014-01-14 DIAGNOSIS — M5431 Sciatica, right side: Secondary | ICD-10-CM

## 2014-01-14 DIAGNOSIS — I4891 Unspecified atrial fibrillation: Secondary | ICD-10-CM

## 2014-01-14 DIAGNOSIS — R0989 Other specified symptoms and signs involving the circulatory and respiratory systems: Secondary | ICD-10-CM

## 2014-01-14 DIAGNOSIS — M5430 Sciatica, unspecified side: Secondary | ICD-10-CM

## 2014-01-14 DIAGNOSIS — R0609 Other forms of dyspnea: Secondary | ICD-10-CM

## 2014-01-14 DIAGNOSIS — R0600 Dyspnea, unspecified: Secondary | ICD-10-CM

## 2014-01-14 NOTE — Progress Notes (Signed)
   Subjective:    Patient ID: [name], female    DOB: May 06, 1924, 80 y.o.   MRN: [id number]  HPI Patient is here for a follow up after visit to the emergency room. Concerns with heart skipping & making her so weak. Patient would like to also discuss maybe taking a 1/2 of her Toprol dosage, if that may help with the skipping of her heart.  She relates a lot of fatigue and tiredness. She states that she feels like she has no energy. She denies being depressed. She states at times her heart races. She also states at times that she feels totally drained of any energy. She states she has the desire to do things but no energy to do it she does give short of breath with some activities but denies PND denies orthopnea and denies excessive swelling  She has a history atrial fibrillation.  ER note was reviewed.Review of Systems  Constitutional: Negative for activity change, appetite change and fatigue.  HENT: Negative for congestion.   Respiratory: Positive for shortness of breath. Negative for cough, wheezing and stridor.   Cardiovascular: Positive for palpitations. Negative for chest pain and leg swelling.  Gastrointestinal: Negative for abdominal pain.  Endocrine: Negative for polydipsia and polyphagia.  Genitourinary: Negative for frequency.  Neurological: Negative for weakness.  Psychiatric/Behavioral: Negative for confusion.       Objective:   Physical Exam  Vitals reviewed. Constitutional: She appears well-nourished. No distress.  Cardiovascular: Regular rhythm and normal heart sounds.   No murmur heard. Pulmonary/Chest: Effort normal and breath sounds normal. No respiratory distress.  Musculoskeletal: She exhibits no edema.  Lymphadenopathy:    She has no cervical adenopathy.  Neurological: She is alert. She exhibits normal muscle tone.  Psychiatric: Her behavior is normal.          Assessment & Plan:  Atrial fibrillation-medication to control heart rate is triggering a  lot of fatigue and tiredness we will do an echo to assess left ventricular function I find no evidence of failure currently on today's exam I also recommend that we refer her to cardiology. 25 minutes spent with patient discussing these issues. I also discussed the rationale of taking anticoagulant. Also discussed the risk of it in if any bleeding issues immediately go to the ER  I would like to see her BNP and also set her up for active. This patient may benefit from being on Coreg instead of metoprolol. She may also benefit from being on a low dose ACE inhibitor. Probably even a diuretic. Patient is hesitant about being on any medicines area I do think it is reasonable to set her up with cardiology. I do believe that she would be more open to potential change after these tests.

## 2014-01-15 ENCOUNTER — Telehealth: Payer: Self-pay | Admitting: Family Medicine

## 2014-01-15 ENCOUNTER — Encounter: Payer: Self-pay | Admitting: Family Medicine

## 2014-01-15 NOTE — Telephone Encounter (Signed)
Echo Prior Auth # [id number], expires 02/13/14 per [facility] e, added PA# to appt notes on appt desk

## 2014-01-20 ENCOUNTER — Ambulatory Visit (HOSPITAL_COMMUNITY)
Admission: RE | Admit: 2014-01-20 | Discharge: 2014-01-20 | Disposition: A | Discharge status: Home / Self Care | Payer: Medicare Other | Source: Ambulatory Visit | Attending: Family Medicine | Admitting: Family Medicine

## 2014-01-20 DIAGNOSIS — E785 Hyperlipidemia, unspecified: Secondary | ICD-10-CM | POA: Diagnosis not present

## 2014-01-20 DIAGNOSIS — I059 Rheumatic mitral valve disease, unspecified: Secondary | ICD-10-CM

## 2014-01-20 DIAGNOSIS — I10 Essential (primary) hypertension: Secondary | ICD-10-CM | POA: Diagnosis not present

## 2014-01-20 DIAGNOSIS — I4891 Unspecified atrial fibrillation: Secondary | ICD-10-CM

## 2014-01-20 DIAGNOSIS — K219 Gastro-esophageal reflux disease without esophagitis: Secondary | ICD-10-CM | POA: Diagnosis not present

## 2014-01-20 DIAGNOSIS — F411 Generalized anxiety disorder: Secondary | ICD-10-CM | POA: Diagnosis not present

## 2014-01-20 DIAGNOSIS — R0600 Dyspnea, unspecified: Secondary | ICD-10-CM

## 2014-01-20 DIAGNOSIS — Z853 Personal history of malignant neoplasm of breast: Secondary | ICD-10-CM | POA: Diagnosis not present

## 2014-01-20 NOTE — Progress Notes (Signed)
  Echocardiogram 2D Echocardiogram has been performed.  [name], [name] 01/20/2014, 11:41 AM

## 2014-01-23 ENCOUNTER — Encounter: Payer: Self-pay | Admitting: *Deleted

## 2014-01-23 ENCOUNTER — Telehealth: Payer: Self-pay | Admitting: Family Medicine

## 2014-01-23 ENCOUNTER — Ambulatory Visit (INDEPENDENT_AMBULATORY_CARE_PROVIDER_SITE_OTHER): Payer: Medicare Other | Admitting: Cardiovascular Disease

## 2014-01-23 ENCOUNTER — Encounter: Payer: Self-pay | Admitting: Cardiovascular Disease

## 2014-01-23 VITALS — BP 151/79 | HR 65 | Ht 64.0 in | Wt 155.0 lb

## 2014-01-23 DIAGNOSIS — R0989 Other specified symptoms and signs involving the circulatory and respiratory systems: Secondary | ICD-10-CM

## 2014-01-23 DIAGNOSIS — R5381 Other malaise: Secondary | ICD-10-CM

## 2014-01-23 DIAGNOSIS — R0609 Other forms of dyspnea: Secondary | ICD-10-CM

## 2014-01-23 DIAGNOSIS — R002 Palpitations: Secondary | ICD-10-CM

## 2014-01-23 DIAGNOSIS — I4891 Unspecified atrial fibrillation: Secondary | ICD-10-CM

## 2014-01-23 DIAGNOSIS — I480 Paroxysmal atrial fibrillation: Secondary | ICD-10-CM

## 2014-01-23 DIAGNOSIS — R0600 Dyspnea, unspecified: Secondary | ICD-10-CM

## 2014-01-23 DIAGNOSIS — R5383 Other fatigue: Secondary | ICD-10-CM

## 2014-01-23 DIAGNOSIS — Z7901 Long term (current) use of anticoagulants: Secondary | ICD-10-CM

## 2014-01-23 NOTE — Telephone Encounter (Signed)
Pt checking on the results for her Echo done on 8/31  Concerned an would like to know what the results are

## 2014-01-23 NOTE — Telephone Encounter (Signed)
Results discussed with patient. Patient advised Echo overall looks good pumping strength still excellent, and valves normal for age. Patient verbalized understanding.

## 2014-01-23 NOTE — Patient Instructions (Signed)
Continue all current medications. Your physician has requested that you have a lexiscan myoview. For further information please visit [email or web address]. Please follow instruction sheet, as given. Office will contact with results via phone or letter.   Your physician wants you to follow up in: 6 months.  You will receive a reminder letter in the mail one-two months in advance.  If you don't receive a letter, please call our office to schedule the follow up appointment

## 2014-01-23 NOTE — Telephone Encounter (Signed)
Results are in patient's chart but has not been signed off by Dr. Nicki Reaper

## 2014-01-23 NOTE — Telephone Encounter (Signed)
Left message to return call 

## 2014-01-23 NOTE — Telephone Encounter (Signed)
Overall looks good pumping strength still excellent, and valves normal for age--heart doc will fill her in more since they did it

## 2014-01-23 NOTE — Progress Notes (Signed)
Patient ID: [name], female   DOB: Nov 30, 1923, 80 y.o.   MRN: [id number]      SUBJECTIVE: The patient is an 80 year old woman with a history of paroxysmal atrial fibrillation for which he takes metoprolol succinate and Xarelto. She was previously unable to tolerate more pack. Most recent echocardiogram on 01/20/2014 demonstrated normal left ventricular systolic function, EF 52-77%, mild LVH with moderate left atrial enlargement. She experiences intermittent palpitations and these occur primarily at night when she is lying in bed and thinking more about her heart. She denies chest pain. She has been more short of breath with exertion over the past 2 years and feels short of breath after walking to and from her mailbox. She has even stopped dancing due to the limitations. She also complains of bilateral hip and lumbar arthritic pain.   Review of Systems: As per "subjective", otherwise negative.  Allergies  Allergen Reactions  . Amoxicillin Hives  . Cephalosporins Itching  . Hydrocodone Itching and Nausea Only    In large doses Made her feel like she was in somebody else's house when getting up at night after taking it  . Lipitor [Atorvastatin] Other (See Comments)    unknown  . Lovenox [Enoxaparin Sodium] Nausea And Vomiting  . Morphine And Related Itching  . Penicillins Swelling  . Sulfa Antibiotics Swelling    Current Outpatient Prescriptions  Medication Sig Dispense Refill  . ALPRAZolam (XANAX) 0.5 MG tablet Take 0.5 mg by mouth 2 (two) times daily as needed for anxiety.       . metoprolol succinate (TOPROL XL) 25 MG 24 hr tablet Take 1 tablet (25 mg total) by mouth daily.  30 tablet  11  . metoprolol tartrate (LOPRESSOR) 25 MG tablet Take 12.5 mg by mouth every 6 (six) hours as needed.      Marland Kitchen omeprazole (PRILOSEC) 20 MG capsule Take 20 mg by mouth daily.      . Rivaroxaban (XARELTO) 15 MG TABS tablet Take 1 tablet (15 mg total) by mouth daily. Take with food. <please make  appointment for refills>  30 tablet  1   No current facility-administered medications for this visit.    Past Medical History  Diagnosis Date  . Hypertension   . Atrial fibrillation   . Cancer     left side  . GERD (gastroesophageal reflux disease)   . Hyperlipidemia   . Osteoporosis   . Osteopenia   . Edema   . Chronic back pain   . Abdominal pain, chronic, right lower quadrant     "ever since I had my appendix removed"    Past Surgical History  Procedure Laterality Date  . Breast surgery    . Appendectomy    . Hernia repair    . Colon surgery      History   Social History  . Marital Status: Widowed    Spouse Name: N/A    Number of Children: 1  . Years of Education: N/A   Occupational History  .     Social History Main Topics  . Smoking status: Never Smoker   . Smokeless tobacco: Never Used  . Alcohol Use: No  . Drug Use: No  . Sexual Activity: No   Other Topics Concern  . Not on file   Social History Narrative  . No narrative on file     Filed Vitals:   01/23/14 1413  BP: 151/79  Pulse: 65  Height: 5\' 4"  (1.626 m)  Weight: 155 lb (  70.308 kg)    PHYSICAL EXAM General: NAD HEENT: Normal. Neck: No JVD, no thyromegaly. Lungs: Clear to auscultation bilaterally with normal respiratory effort. CV: Nondisplaced PMI.  Regular rate and rhythm, normal S1/S2, no S3/S4, no murmur. No pretibial or periankle edema.   Abdomen: Soft, nontender, no hepatosplenomegaly, no distention.  Neurologic: Alert and oriented.  Psych: Normal affect. Skin: Normal. Musculoskeletal: Normal range of motion, no gross deformities. Extremities: No clubbing or cyanosis.   ECG: Most recent ECG reviewed.      ASSESSMENT AND PLAN: 1. Paroxysmal atrial fibrillation: She experiences intermittent palpitations, she is presently in a regular rhythm. Continue Toprol-XL 25 mg daily and metoprolol tartrate as needed. Continue Xarelto  2. Essential HTN: Reasonably controlled for  age. No changes to medical therapy.  3. Dyspnea on exertion and fatigue: Given her symptoms, I will proceed with a Lexiscan Cardiolite stress test to evaluate for occult ischemic heart disease.  Dispo: f/u 6 months or sooner if stress test is abnormal.  Kate Sable, M.D., F.A.C.C.

## 2014-01-25 ENCOUNTER — Other Ambulatory Visit: Payer: Self-pay | Admitting: Family Medicine

## 2014-01-25 NOTE — Telephone Encounter (Signed)
Last 01/14/14

## 2014-01-27 NOTE — Telephone Encounter (Signed)
Ok times 5  

## 2014-01-28 DIAGNOSIS — I2510 Atherosclerotic heart disease of native coronary artery without angina pectoris: Secondary | ICD-10-CM

## 2014-01-28 DIAGNOSIS — R42 Dizziness and giddiness: Secondary | ICD-10-CM

## 2014-01-28 DIAGNOSIS — I4891 Unspecified atrial fibrillation: Secondary | ICD-10-CM

## 2014-01-28 DIAGNOSIS — I10 Essential (primary) hypertension: Secondary | ICD-10-CM

## 2014-01-29 ENCOUNTER — Other Ambulatory Visit: Payer: Self-pay | Admitting: Family Medicine

## 2014-01-30 ENCOUNTER — Ambulatory Visit: Payer: Medicare Other | Admitting: Cardiovascular Disease

## 2014-01-31 ENCOUNTER — Encounter (HOSPITAL_COMMUNITY)
Admission: RE | Admit: 2014-01-31 | Discharge: 2014-01-31 | Disposition: A | Discharge status: Home / Self Care | Payer: Medicare Other | Source: Ambulatory Visit | Attending: Cardiovascular Disease | Admitting: Cardiovascular Disease

## 2014-01-31 ENCOUNTER — Encounter (HOSPITAL_COMMUNITY): Payer: Self-pay

## 2014-01-31 ENCOUNTER — Ambulatory Visit (HOSPITAL_COMMUNITY)
Admission: RE | Admit: 2014-01-31 | Discharge: 2014-01-31 | Disposition: A | Discharge status: Home / Self Care | Payer: Medicare Other | Source: Ambulatory Visit | Attending: Cardiovascular Disease | Admitting: Cardiovascular Disease

## 2014-01-31 DIAGNOSIS — R0609 Other forms of dyspnea: Secondary | ICD-10-CM | POA: Diagnosis not present

## 2014-01-31 DIAGNOSIS — R5383 Other fatigue: Secondary | ICD-10-CM | POA: Diagnosis not present

## 2014-01-31 DIAGNOSIS — R0989 Other specified symptoms and signs involving the circulatory and respiratory systems: Secondary | ICD-10-CM | POA: Insufficient documentation

## 2014-01-31 DIAGNOSIS — R0602 Shortness of breath: Secondary | ICD-10-CM

## 2014-01-31 DIAGNOSIS — R5381 Other malaise: Secondary | ICD-10-CM | POA: Diagnosis not present

## 2014-01-31 DIAGNOSIS — R0600 Dyspnea, unspecified: Secondary | ICD-10-CM

## 2014-01-31 MED ORDER — SODIUM CHLORIDE 0.9 % IJ SOLN
10.0000 mL | INTRAMUSCULAR | Status: DC | PRN
  Administered 2014-01-31: 10 mL via INTRAVENOUS

## 2014-01-31 MED ORDER — REGADENOSON 0.4 MG/5ML IV SOLN
0.4000 mg | Freq: Once | INTRAVENOUS | Status: AC | PRN
  Administered 2014-01-31: 0.4 mg via INTRAVENOUS

## 2014-01-31 MED ORDER — SODIUM CHLORIDE 0.9 % IJ SOLN
INTRAMUSCULAR | Status: AC
  Filled 2014-01-31: qty 24

## 2014-01-31 MED ORDER — REGADENOSON 0.4 MG/5ML IV SOLN
INTRAVENOUS | Status: AC
Start: 2014-01-31 — End: 2014-01-31
  Administered 2014-01-31: 0.4 mg via INTRAVENOUS
  Filled 2014-01-31: qty 5

## 2014-01-31 MED ORDER — TECHNETIUM TC 99M SESTAMIBI GENERIC - CARDIOLITE
30.0000 | Freq: Once | INTRAVENOUS | Status: AC | PRN
  Administered 2014-01-31: 30 via INTRAVENOUS

## 2014-01-31 MED ORDER — TECHNETIUM TC 99M SESTAMIBI - CARDIOLITE
10.0000 | Freq: Once | INTRAVENOUS | Status: AC | PRN
  Administered 2014-01-31: 10 via INTRAVENOUS

## 2014-01-31 MED ORDER — SODIUM CHLORIDE 0.9 % IJ SOLN
INTRAMUSCULAR | Status: AC
  Administered 2014-01-31: 10 mL via INTRAVENOUS
  Filled 2014-01-31: qty 10

## 2014-01-31 NOTE — Progress Notes (Addendum)
Stress Lab Nurses Notes - [facility] Na  ERIN OBANDO 01/31/2014 Reason for doing test: PAF & DOE Type of test: Wille Glaser Nurse performing test: Gerrit Halls, RN Nuclear Medicine Tech: Melburn Hake Echo Tech: Not Applicable MD performing test: S. McDowell/K.Purcell Nails NP Family MD: Sallee Lange Test explained and consent signed: Yes.   IV started: Saline lock flushed, No redness or edema and Saline lock started in radiology Symptoms: Dizziness & discomfort in legs Treatment/Intervention: None Reason test stopped: protocol completed After recovery IV was: Discontinued via X-ray tech and No redness or edema Patient to return to Nuc. Med at : 13:05 Patient discharged: Home Patient's Condition upon discharge was: stable Comments: Prior to test tachycardia noted with AFib  HR rate 155. Instructed to take meds from home (Toprol), waited for 2 Hours, then test was done.  During test BP 121/72 & HR 146.  Recovery BP 134/86 & HR 114.  Symptom resolved during recovery. Continues to have headache and leg discomfort.  Geanie Cooley T

## 2014-02-03 ENCOUNTER — Telehealth: Payer: Self-pay | Admitting: *Deleted

## 2014-02-03 ENCOUNTER — Ambulatory Visit: Payer: Medicare Other | Admitting: Family Medicine

## 2014-02-03 NOTE — Telephone Encounter (Signed)
   Pt informed of results. She mentioned that she was highly upset with her Echo test because she was told the machine was not functioning properly and had to have the test re done at least 3 times. She also states that nobody would listen to her when she said her results couldn't be correct because her heart keeps skipping a beat.  Following her stress test: Pt c/o having a really bad headache, no feeling in her legs & states she is unable to walk. She thinks it's coming from the medication that was injected during the test.   Pt does not wish to go to the ER   Pt states she will f/u with PCP when she is feeling a little better.

## 2014-02-03 NOTE — Telephone Encounter (Signed)
Message copied by Orion Modest on Mon Feb 03, 2014  9:39 AM ------      Message from: Kate Sable A      Created: Mon Feb 03, 2014  8:42 AM       Good results, as echo demonstrates normal heart function which is more accurate than Lexiscan. No evidence of ischemia or scar. ------

## 2014-02-17 ENCOUNTER — Other Ambulatory Visit: Payer: Self-pay | Admitting: Cardiovascular Disease

## 2014-02-25 ENCOUNTER — Ambulatory Visit: Payer: Medicare Other | Admitting: Family Medicine

## 2014-03-24 ENCOUNTER — Encounter (HOSPITAL_COMMUNITY): Payer: Self-pay

## 2014-04-04 ENCOUNTER — Ambulatory Visit (INDEPENDENT_AMBULATORY_CARE_PROVIDER_SITE_OTHER): Payer: Medicare Other | Admitting: Family Medicine

## 2014-04-04 ENCOUNTER — Encounter: Payer: Self-pay | Admitting: Family Medicine

## 2014-04-04 VITALS — BP 114/80 | Ht 64.0 in | Wt 155.1 lb

## 2014-04-04 DIAGNOSIS — I4891 Unspecified atrial fibrillation: Secondary | ICD-10-CM

## 2014-04-04 DIAGNOSIS — F320 Major depressive disorder, single episode, mild: Secondary | ICD-10-CM

## 2014-04-04 MED ORDER — FLUOXETINE HCL 10 MG PO TABS: 10.0000 mg | ORAL_TABLET | Freq: Every day | ORAL | Status: DC

## 2014-04-04 MED ORDER — HYDROCODONE-ACETAMINOPHEN 5-325 MG PO TABS: 1.0000 | ORAL_TABLET | Freq: Four times a day (QID) | ORAL | Status: DC | PRN

## 2014-04-04 NOTE — Progress Notes (Signed)
   Subjective:    Patient ID: [name], female    DOB: 02/24/1924, 80 y.o.   MRN: [id number]  HPI Patient is here to follow up on her anxiety, GERD and atrial fibrillation (med check). Patient states that she would like to get the results of her recent stress test. Patient states that she has no other concerns at this time.  Patient states that she is depressed at times her son has gone through significant healthcare issues which depresses her. She is not suicidal. She wonders if her blood thinner is making her feel bad. Review of Systems    denies fever chills sweats vomiting chest pain shortness of breath Objective:   Physical Exam Heart irregular rate controlled pulse normal blood pressure good extremities no edema lungs are clear no crackles       Assessment & Plan:  #1 atrial fibrillation continue blood thinner, if heart rate increases notify us. Metoprolol when necessary  #2 depression start Prozac daily patient to notify us if any problems or worsening otherwise follow-up within 3-4 weeks

## 2014-04-21 ENCOUNTER — Emergency Department (HOSPITAL_COMMUNITY): Payer: Medicare Other

## 2014-04-21 ENCOUNTER — Emergency Department (HOSPITAL_COMMUNITY)
Admission: EM | Admit: 2014-04-21 | Discharge: 2014-04-21 | Disposition: A | Discharge status: Home / Self Care | Payer: Medicare Other | Attending: Emergency Medicine | Admitting: Emergency Medicine

## 2014-04-21 ENCOUNTER — Other Ambulatory Visit: Payer: Self-pay | Admitting: Family Medicine

## 2014-04-21 ENCOUNTER — Encounter (HOSPITAL_COMMUNITY): Payer: Self-pay | Admitting: *Deleted

## 2014-04-21 DIAGNOSIS — G8929 Other chronic pain: Secondary | ICD-10-CM | POA: Insufficient documentation

## 2014-04-21 DIAGNOSIS — Z9089 Acquired absence of other organs: Secondary | ICD-10-CM | POA: Diagnosis not present

## 2014-04-21 DIAGNOSIS — Z880 Allergy status to penicillin: Secondary | ICD-10-CM | POA: Diagnosis not present

## 2014-04-21 DIAGNOSIS — N390 Urinary tract infection, site not specified: Secondary | ICD-10-CM | POA: Insufficient documentation

## 2014-04-21 DIAGNOSIS — I10 Essential (primary) hypertension: Secondary | ICD-10-CM | POA: Insufficient documentation

## 2014-04-21 DIAGNOSIS — M549 Dorsalgia, unspecified: Secondary | ICD-10-CM | POA: Diagnosis not present

## 2014-04-21 DIAGNOSIS — Z9889 Other specified postprocedural states: Secondary | ICD-10-CM | POA: Diagnosis not present

## 2014-04-21 DIAGNOSIS — R52 Pain, unspecified: Secondary | ICD-10-CM

## 2014-04-21 DIAGNOSIS — E785 Hyperlipidemia, unspecified: Secondary | ICD-10-CM | POA: Insufficient documentation

## 2014-04-21 DIAGNOSIS — Z79899 Other long term (current) drug therapy: Secondary | ICD-10-CM | POA: Diagnosis not present

## 2014-04-21 DIAGNOSIS — R1013 Epigastric pain: Secondary | ICD-10-CM | POA: Diagnosis present

## 2014-04-21 DIAGNOSIS — K219 Gastro-esophageal reflux disease without esophagitis: Secondary | ICD-10-CM | POA: Diagnosis not present

## 2014-04-21 LAB — CBC WITH DIFFERENTIAL/PLATELET
Basophils Absolute: 0 10*3/uL (ref 0.0–0.1)
Basophils Relative: 0 % (ref 0–1)
EOS PCT: 1 % (ref 0–5)
Eosinophils Absolute: 0.1 10*3/uL (ref 0.0–0.7)
HEMATOCRIT: 39.2 % (ref 36.0–46.0)
Hemoglobin: 12.9 g/dL (ref 12.0–15.0)
LYMPHS ABS: 2.1 10*3/uL (ref 0.7–4.0)
Lymphocytes Relative: 28 % (ref 12–46)
MCH: 28.9 pg (ref 26.0–34.0)
MCHC: 32.9 g/dL (ref 30.0–36.0)
MCV: 87.7 fL (ref 78.0–100.0)
Monocytes Absolute: 0.5 10*3/uL (ref 0.1–1.0)
Monocytes Relative: 7 % (ref 3–12)
Neutro Abs: 4.9 10*3/uL (ref 1.7–7.7)
Neutrophils Relative %: 64 % (ref 43–77)
Platelets: 228 10*3/uL (ref 150–400)
RBC: 4.47 MIL/uL (ref 3.87–5.11)
RDW: 12.7 % (ref 11.5–15.5)
WBC: 7.7 10*3/uL (ref 4.0–10.5)

## 2014-04-21 LAB — URINALYSIS, ROUTINE W REFLEX MICROSCOPIC
Bilirubin Urine: NEGATIVE
Glucose, UA: NEGATIVE mg/dL
Hgb urine dipstick: NEGATIVE
Ketones, ur: NEGATIVE mg/dL
Nitrite: POSITIVE — AB
Protein, ur: NEGATIVE mg/dL
Specific Gravity, Urine: 1.015 (ref 1.005–1.030)
Urobilinogen, UA: 0.2 mg/dL (ref 0.0–1.0)
pH: 5.5 (ref 5.0–8.0)

## 2014-04-21 LAB — COMPREHENSIVE METABOLIC PANEL
ALT: 12 U/L (ref 0–35)
AST: 19 U/L (ref 0–37)
Albumin: 3.6 g/dL (ref 3.5–5.2)
Alkaline Phosphatase: 68 U/L (ref 39–117)
Anion gap: 11 (ref 5–15)
BILIRUBIN TOTAL: 0.3 mg/dL (ref 0.3–1.2)
BUN: 15 mg/dL (ref 6–23)
CALCIUM: 9.2 mg/dL (ref 8.4–10.5)
CO2: 28 meq/L (ref 19–32)
Chloride: 103 mEq/L (ref 96–112)
Creatinine, Ser: 0.8 mg/dL (ref 0.50–1.10)
GFR, EST AFRICAN AMERICAN: 74 mL/min — AB (ref >90)
GFR, EST NON AFRICAN AMERICAN: 64 mL/min — AB (ref >90)
GLUCOSE: 113 mg/dL — AB (ref 70–99)
Potassium: 4.1 mEq/L (ref 3.7–5.3)
SODIUM: 142 meq/L (ref 137–147)
Total Protein: 7.1 g/dL (ref 6.0–8.3)

## 2014-04-21 LAB — URINE MICROSCOPIC-ADD ON

## 2014-04-21 MED ORDER — CIPROFLOXACIN IN D5W 400 MG/200ML IV SOLN
400.0000 mg | Freq: Once | INTRAVENOUS | Status: AC
  Administered 2014-04-21: 400 mg via INTRAVENOUS
  Filled 2014-04-21: qty 200

## 2014-04-21 MED ORDER — TRAMADOL HCL 50 MG PO TABS: 50.0000 mg | ORAL_TABLET | Freq: Four times a day (QID) | ORAL | Status: DC | PRN

## 2014-04-21 MED ORDER — LEVOFLOXACIN 500 MG PO TABS: 500.0000 mg | ORAL_TABLET | Freq: Every day | ORAL | Status: DC

## 2014-04-21 NOTE — ED Notes (Signed)
Pt states urinary frequency, lower back pain, abdominal pain since yesterday. Pt also states pain to RLQ from appendectomy surgery "years ago."

## 2014-04-21 NOTE — ED Notes (Signed)
Pt denies SOB or difficultly swallowing, states rash has gotten better since stopped and denies pain at this time

## 2014-04-21 NOTE — Discharge Instructions (Signed)
Follow up with your md next week. °

## 2014-04-21 NOTE — ED Notes (Signed)
Pt states that her recent stress test caused her UTI and that she will refuse to have done again

## 2014-04-21 NOTE — ED Provider Notes (Signed)
CSN: [id number]     Arrival date & time 04/21/14  1019 History  This chart was scribed for Maudry Diego, MD by Stephania Fragmin, ED Scribe. This patient was seen in room APA03/APA03 and the patient's care was started at 12:36 PM.    Chief Complaint  Patient presents with  . Back Pain    Patient is a 80 y.o. female presenting with abdominal pain. The history is provided by the patient and medical records. No language interpreter was used.  Abdominal Pain Pain location:  Suprapubic and epigastric Pain quality: fullness   Pain radiation: lower back. Pain severity:  Mild Onset quality:  Sudden Duration:  4 weeks Chronicity:  Recurrent (patient experienced similar symptoms with prior UTI) Relieved by:  None tried Worsened by:  Nothing tried Ineffective treatments:  None tried Associated symptoms: nausea   Associated symptoms: no cough, no diarrhea, no fatigue and no hematuria   Risk factors: being elderly      HPI Comments: [name] is a 80 y.o. female who presents to the Emergency Department complaining of lower back pain and supragastric abdominal pain that began a day ago. She states that her stomach feels painful and swollen. She reports occasional associated nausea. She suspects a UTI because she experienced similar symptoms when she had a UTI previously. Her PCP is Dr. Sallee Lange.  Past Medical History  Diagnosis Date  . Hypertension   . Atrial fibrillation   . Cancer     left side  . GERD (gastroesophageal reflux disease)   . Hyperlipidemia   . Osteoporosis   . Osteopenia   . Edema   . Chronic back pain   . Abdominal pain, chronic, right lower quadrant     "ever since I had my appendix removed"   Past Surgical History  Procedure Laterality Date  . Breast surgery    . Appendectomy    . Hernia repair    . Colon surgery     Family History  Problem Relation Age of Onset  . Heart disease    . Arthritis    . Cancer    . Diabetes Mother   . Hyperlipidemia  Brother    History  Substance Use Topics  . Smoking status: Never Smoker   . Smokeless tobacco: Never Used  . Alcohol Use: No   OB History    Gravida Para Term Preterm AB TAB SAB Ectopic Multiple Living   2 1   1  1   1      Review of Systems  Constitutional: Negative for appetite change and fatigue.  HENT: Negative for congestion, ear discharge and sinus pressure.   Eyes: Negative for discharge.  Respiratory: Negative for cough.   Gastrointestinal: Positive for nausea and abdominal pain. Negative for diarrhea.  Genitourinary: Positive for frequency. Negative for hematuria.  Musculoskeletal: Positive for back pain (lower).  Skin: Negative for rash.  Neurological: Negative for seizures.  Psychiatric/Behavioral: Negative for hallucinations.      Allergies  Amoxicillin; Cephalosporins; Hydrocodone; Lipitor; Lovenox; Morphine and related; Penicillins; and Sulfa antibiotics  Home Medications   Prior to Admission medications   Medication Sig Start Date End Date Taking? Authorizing Provider  ALPRAZolam (XANAX) 0.5 MG tablet TAKE (1/2) TO (1) TABLET TWICE DAILY AS NEEDED. 01/28/14  Yes [name], MD  calcium carbonate (TUMS - DOSED IN MG ELEMENTAL CALCIUM) 500 MG chewable tablet Chew 2 tablets by mouth daily as needed for indigestion or heartburn.   Yes Historical Provider, MD  FLUoxetine (PROZAC) 10 MG tablet Take 1 tablet (10 mg total) by mouth daily. 04/04/14  Yes [name], MD  HYDROcodone-acetaminophen (NORCO/VICODIN) 5-325 MG per tablet Take 1 tablet by mouth every 6 (six) hours as needed. Patient taking differently: Take 1 tablet by mouth every 6 (six) hours as needed for moderate pain.  04/04/14  Yes [name], MD  metoprolol succinate (TOPROL-XL) 25 MG 24 hr tablet TAKE ONE TABLET BY MOUTH ONCE DAILY. 04/21/14  Yes [name], MD  metoprolol tartrate (LOPRESSOR) 25 MG tablet Take 12.5 mg by mouth every 6 (six) hours as needed (palpitations).    Yes Historical  Provider, MD  omeprazole (PRILOSEC) 20 MG capsule Take 20 mg by mouth daily as needed (acid reflux).    Yes Historical Provider, MD  ranitidine (ZANTAC) 150 MG tablet Take 150 mg by mouth 2 (two) times daily.   Yes Historical Provider, MD  XARELTO 15 MG TABS tablet TAKE 1 TABLET ONCE DAILY WITH FOOD. 02/17/14  Yes Mihai Croitoru, MD   BP 152/69 mmHg  Pulse 66  Temp(Src) 99.4 F (37.4 C) (Oral)  Resp 18  Ht 5\' 4"  (1.626 m)  Wt 150 lb (68.04 kg)  BMI 25.73 kg/m2  SpO2 97% Physical Exam  Constitutional: She is oriented to person, place, and time. She appears well-developed.  HENT:  Head: Normocephalic.  Eyes: Conjunctivae and EOM are normal. No scleral icterus.  Neck: Neck supple. No thyromegaly present.  Cardiovascular: Normal rate and regular rhythm.  Exam reveals no gallop and no friction rub.   No murmur heard. Pulmonary/Chest: No stridor. She has no wheezes. She has no rales. She exhibits no tenderness.  Abdominal: There is tenderness.  Minimal epigastric and suprapubic tenderness.  Musculoskeletal: Normal range of motion. She exhibits no edema.  Lymphadenopathy:    She has no cervical adenopathy.  Neurological: She is oriented to person, place, and time. She exhibits normal muscle tone. Coordination normal.  Skin: No rash noted. No erythema.  Psychiatric: She has a normal mood and affect. Her behavior is normal.  Nursing note and vitals reviewed.   ED Course  Procedures (including critical care time)  DIAGNOSTIC STUDIES: Oxygen Saturation is 97% on room air, normal by my interpretation.    COORDINATION OF CARE: 12:39 PM - Discussed treatment plan with pt at bedside and pt agreed to plan.   Labs Review Labs Reviewed  URINALYSIS, ROUTINE W REFLEX MICROSCOPIC - Abnormal; Notable for the following:    Nitrite POSITIVE (*)    Leukocytes, UA MODERATE (*)    All other components within normal limits  COMPREHENSIVE METABOLIC PANEL - Abnormal; Notable for the following:     Glucose, Bld 113 (*)    GFR calc non Af Amer 64 (*)    GFR calc Af Amer 74 (*)    All other components within normal limits  URINE MICROSCOPIC-ADD ON - Abnormal; Notable for the following:    Bacteria, UA MANY (*)    All other components within normal limits  CBC WITH DIFFERENTIAL    Imaging Review No results found.   EKG Interpretation None      MDM   Final diagnoses:  None    The chart was scribed for me under my direct supervision.  I personally performed the history, physical, and medical decision making and all procedures in the evaluation of this patient..    uti  Maudry Diego, MD 04/21/14 [phone]

## 2014-04-21 NOTE — ED Notes (Signed)
Patient called out reporting itching to right arm where IV was located and Cipro infusing. Rash noted progress up right arm, Dr. Roderic Palau and Theadora Rama, RN notified. Cipro was discontinued.

## 2014-04-24 LAB — URINE CULTURE: Colony Count: >=100000

## 2014-04-27 ENCOUNTER — Telehealth (HOSPITAL_COMMUNITY): Payer: Self-pay

## 2014-04-27 NOTE — Telephone Encounter (Signed)
Post ED Visit - Positive Culture Follow-up  Culture report reviewed by antimicrobial stewardship pharmacist: []  Wes [place], Pharm.D., BCPS [x]  Heide Guile, Pharm.D., BCPS []  Alycia Rossetti, Pharm.D., BCPS []  [place], [place].D., BCPS, AAHIVP []  Legrand Como, Pharm.D., BCPS, AAHIVP []  Elicia Lamp, Pharm.D.   Positive Urine culture, >/= 100,000 colonies -> Klebsiella Species Treated with Levofloxacin , organism sensitive to the same and no further patient follow-up is required at this time.  Dortha Kern 04/27/2014, 8:39 PM

## 2014-05-02 ENCOUNTER — Other Ambulatory Visit: Payer: Self-pay | Admitting: Family Medicine

## 2014-05-02 ENCOUNTER — Ambulatory Visit: Payer: Medicare Other | Admitting: Family Medicine

## 2014-05-02 NOTE — Telephone Encounter (Signed)
Patient states she does not need this and did not request it

## 2014-05-05 ENCOUNTER — Encounter: Payer: Self-pay | Admitting: Family Medicine

## 2014-05-05 ENCOUNTER — Ambulatory Visit (INDEPENDENT_AMBULATORY_CARE_PROVIDER_SITE_OTHER): Payer: Medicare Other | Admitting: Family Medicine

## 2014-05-05 VITALS — BP 138/72 | Ht 64.0 in | Wt 156.0 lb

## 2014-05-05 DIAGNOSIS — N390 Urinary tract infection, site not specified: Secondary | ICD-10-CM

## 2014-05-05 DIAGNOSIS — I480 Paroxysmal atrial fibrillation: Secondary | ICD-10-CM

## 2014-05-05 DIAGNOSIS — R350 Frequency of micturition: Secondary | ICD-10-CM

## 2014-05-05 LAB — POCT URINALYSIS DIPSTICK
RBC UA: 50
SPEC GRAV UA: 1.025
pH, UA: 5

## 2014-05-05 MED ORDER — CIPROFLOXACIN HCL 250 MG PO TABS
250.0000 mg | ORAL_TABLET | Freq: Two times a day (BID) | ORAL | Status: AC
Start: 2014-05-05 — End: 2014-05-14

## 2014-05-05 NOTE — Progress Notes (Signed)
   Subjective:    Patient ID: [name], female    DOB: 1924/01/17, 80 y.o.   MRN: [id number]  HPI Patient is here today for a f/u from 11/30 ED.  She went for a UTI.  She had a stress test and after that is when the UTI came back.  Pelvic pain.  Urinary frequency.    History of atrial fibrillation Review of Systems    she denies fever vomiting diarrhea denies chest pressure or pain Objective:   Physical Exam Lungs clear heart irregular rate controlled extremities no edema abdomen soft  Blood pressure recheck good     Assessment & Plan:  Dysuria or urinary frequency probable UTI antibiotics prescribed culture sent await results  Patient under a lot of stress she does appear she can helping herself. Her son tries to help her but he has health problems  Patient had recent stress test essentially negative ejection fraction 42% patient does not want to go back to see cardiology

## 2014-05-06 ENCOUNTER — Other Ambulatory Visit: Payer: Self-pay | Admitting: Family Medicine

## 2014-05-07 LAB — URINE CULTURE: Colony Count: >=100000

## 2014-05-07 NOTE — Telephone Encounter (Signed)
May have this +4 additional refills 

## 2014-05-21 ENCOUNTER — Other Ambulatory Visit: Payer: Self-pay | Admitting: Family Medicine

## 2014-05-27 NOTE — Telephone Encounter (Signed)
scotts pt 

## 2014-05-29 NOTE — Telephone Encounter (Signed)
May have this and 4 refills 

## 2014-06-27 ENCOUNTER — Emergency Department (HOSPITAL_COMMUNITY)
Admission: EM | Admit: 2014-06-27 | Discharge: 2014-06-27 | Discharge status: Against Medical Advice | Payer: Medicare Other | Attending: Emergency Medicine | Admitting: Emergency Medicine

## 2014-06-27 ENCOUNTER — Ambulatory Visit (HOSPITAL_COMMUNITY): Admission: RE | Admit: 2014-06-27 | Payer: Medicare Other | Source: Ambulatory Visit

## 2014-06-27 ENCOUNTER — Encounter (HOSPITAL_COMMUNITY): Payer: Self-pay | Admitting: Emergency Medicine

## 2014-06-27 ENCOUNTER — Emergency Department (HOSPITAL_COMMUNITY): Payer: Medicare Other

## 2014-06-27 DIAGNOSIS — Z79899 Other long term (current) drug therapy: Secondary | ICD-10-CM | POA: Diagnosis not present

## 2014-06-27 DIAGNOSIS — Z880 Allergy status to penicillin: Secondary | ICD-10-CM | POA: Insufficient documentation

## 2014-06-27 DIAGNOSIS — I10 Essential (primary) hypertension: Secondary | ICD-10-CM | POA: Insufficient documentation

## 2014-06-27 DIAGNOSIS — K219 Gastro-esophageal reflux disease without esophagitis: Secondary | ICD-10-CM | POA: Insufficient documentation

## 2014-06-27 DIAGNOSIS — G8929 Other chronic pain: Secondary | ICD-10-CM | POA: Insufficient documentation

## 2014-06-27 DIAGNOSIS — Z8739 Personal history of other diseases of the musculoskeletal system and connective tissue: Secondary | ICD-10-CM | POA: Insufficient documentation

## 2014-06-27 DIAGNOSIS — R109 Unspecified abdominal pain: Secondary | ICD-10-CM

## 2014-06-27 DIAGNOSIS — Z859 Personal history of malignant neoplasm, unspecified: Secondary | ICD-10-CM | POA: Insufficient documentation

## 2014-06-27 DIAGNOSIS — R52 Pain, unspecified: Secondary | ICD-10-CM

## 2014-06-27 DIAGNOSIS — R1032 Left lower quadrant pain: Secondary | ICD-10-CM | POA: Insufficient documentation

## 2014-06-27 LAB — COMPREHENSIVE METABOLIC PANEL
ALK PHOS: 62 U/L (ref 39–117)
ALT: 17 U/L (ref 0–35)
AST: 22 U/L (ref 0–37)
Albumin: 4 g/dL (ref 3.5–5.2)
Anion gap: 6 (ref 5–15)
BUN: 13 mg/dL (ref 6–23)
CALCIUM: 9.1 mg/dL (ref 8.4–10.5)
CHLORIDE: 109 mmol/L (ref 96–112)
CO2: 27 mmol/L (ref 19–32)
Creatinine, Ser: 0.64 mg/dL (ref 0.50–1.10)
GFR calc non Af Amer: 77 mL/min — ABNORMAL LOW (ref >90)
GFR, EST AFRICAN AMERICAN: 90 mL/min — AB (ref >90)
GLUCOSE: 96 mg/dL (ref 70–99)
POTASSIUM: 3.3 mmol/L — AB (ref 3.5–5.1)
Sodium: 142 mmol/L (ref 135–145)
Total Bilirubin: 0.7 mg/dL (ref 0.3–1.2)
Total Protein: 7.3 g/dL (ref 6.0–8.3)

## 2014-06-27 LAB — URINALYSIS, ROUTINE W REFLEX MICROSCOPIC
Bilirubin Urine: NEGATIVE
Glucose, UA: NEGATIVE mg/dL
Hgb urine dipstick: NEGATIVE
KETONES UR: NEGATIVE mg/dL
Leukocytes, UA: NEGATIVE
NITRITE: NEGATIVE
Protein, ur: NEGATIVE mg/dL
Specific Gravity, Urine: 1.01 (ref 1.005–1.030)
UROBILINOGEN UA: 0.2 mg/dL (ref 0.0–1.0)
pH: 5.5 (ref 5.0–8.0)

## 2014-06-27 LAB — CBC WITH DIFFERENTIAL/PLATELET
BASOS PCT: 0 % (ref 0–1)
Basophils Absolute: 0 10*3/uL (ref 0.0–0.1)
EOS ABS: 0.1 10*3/uL (ref 0.0–0.7)
Eosinophils Relative: 2 % (ref 0–5)
HEMATOCRIT: 40.1 % (ref 36.0–46.0)
HEMOGLOBIN: 13.2 g/dL (ref 12.0–15.0)
LYMPHS ABS: 2.3 10*3/uL (ref 0.7–4.0)
Lymphocytes Relative: 31 % (ref 12–46)
MCH: 28.4 pg (ref 26.0–34.0)
MCHC: 32.9 g/dL (ref 30.0–36.0)
MCV: 86.2 fL (ref 78.0–100.0)
MONOS PCT: 8 % (ref 3–12)
Monocytes Absolute: 0.6 10*3/uL (ref 0.1–1.0)
Neutro Abs: 4.5 10*3/uL (ref 1.7–7.7)
Neutrophils Relative %: 59 % (ref 43–77)
PLATELETS: 229 10*3/uL (ref 150–400)
RBC: 4.65 MIL/uL (ref 3.87–5.11)
RDW: 12.6 % (ref 11.5–15.5)
WBC: 7.6 10*3/uL (ref 4.0–10.5)

## 2014-06-27 NOTE — ED Provider Notes (Signed)
CSN: [id number]     Arrival date & time 06/27/14  1242 History   First MD Initiated Contact with Patient 06/27/14 1649     Chief Complaint  Patient presents with  . Abdominal Pain     (Consider location/radiation/quality/duration/timing/severity/associated sxs/prior Treatment) Patient is a 81 y.o. female presenting with abdominal pain. The history is provided by the patient (the pt states she had some lower abd pain today).  Abdominal Pain Pain location:  LLQ Pain quality: aching   Pain radiates to:  Does not radiate Pain severity:  Mild Onset quality:  Gradual Timing:  Intermittent Progression:  Improving Chronicity:  Recurrent Context: not alcohol use   Associated symptoms: no chest pain, no cough, no diarrhea, no fatigue and no hematuria     Past Medical History  Diagnosis Date  . Hypertension   . Atrial fibrillation   . Cancer     left side  . GERD (gastroesophageal reflux disease)   . Hyperlipidemia   . Osteoporosis   . Osteopenia   . Edema   . Chronic back pain   . Abdominal pain, chronic, right lower quadrant     "ever since I had my appendix removed"   Past Surgical History  Procedure Laterality Date  . Breast surgery    . Appendectomy    . Hernia repair    . Colon surgery     Family History  Problem Relation Age of Onset  . Heart disease    . Arthritis    . Cancer    . Diabetes Mother   . Hyperlipidemia Brother    History  Substance Use Topics  . Smoking status: Never Smoker   . Smokeless tobacco: Never Used  . Alcohol Use: No   OB History    Gravida Para Term Preterm AB TAB SAB Ectopic Multiple Living   2 1   1  1   1      Review of Systems  Constitutional: Negative for appetite change and fatigue.  HENT: Negative for congestion, ear discharge and sinus pressure.   Eyes: Negative for discharge.  Respiratory: Negative for cough.   Cardiovascular: Negative for chest pain.  Gastrointestinal: Positive for abdominal pain. Negative for  diarrhea.  Genitourinary: Negative for frequency and hematuria.  Musculoskeletal: Negative for back pain.  Skin: Negative for rash.  Neurological: Negative for seizures and headaches.  Psychiatric/Behavioral: Negative for hallucinations.      Allergies  Amoxicillin; Cephalosporins; Hydrocodone; Lipitor; Lovenox; Morphine and related; Penicillins; and Sulfa antibiotics  Home Medications   Prior to Admission medications   Medication Sig Start Date End Date Taking? Authorizing Provider  ALPRAZolam (XANAX) 0.5 MG tablet TAKE (1/2) TO (1) TABLET TWICE DAILY AS NEEDED. 05/29/14   [name], MD  calcium carbonate (TUMS - DOSED IN MG ELEMENTAL CALCIUM) 500 MG chewable tablet Chew 2 tablets by mouth daily as needed for indigestion or heartburn.    Historical Provider, MD  FLUoxetine (PROZAC) 10 MG tablet Take 1 tablet (10 mg total) by mouth daily. 04/04/14   [name], MD  metoprolol succinate (TOPROL-XL) 25 MG 24 hr tablet TAKE ONE TABLET BY MOUTH ONCE DAILY. 05/21/14   [name], MD  metoprolol tartrate (LOPRESSOR) 25 MG tablet Take 12.5 mg by mouth every 6 (six) hours as needed (palpitations).     Historical Provider, MD  omeprazole (PRILOSEC) 20 MG capsule Take 20 mg by mouth daily as needed (acid reflux).     Historical Provider, MD  ranitidine (ZANTAC)  150 MG tablet Take 150 mg by mouth 2 (two) times daily.    Historical Provider, MD  traMADol (ULTRAM) 50 MG tablet TAKE (1) TABLET BY MOUTH EVERY (6) HOURS AS NEEDED. 05/08/14   [name], MD  XARELTO 15 MG TABS tablet TAKE 1 TABLET ONCE DAILY WITH FOOD. 02/17/14   Mihai Croitoru, MD   BP 160/91 mmHg  Pulse 83  Temp(Src) 98.7 F (37.1 C) (Oral)  Resp 18  Ht 5\' 4"  (1.626 m)  Wt 150 lb (68.04 kg)  BMI 25.73 kg/m2  SpO2 97% Physical Exam  Constitutional: She is oriented to person, place, and time. She appears well-developed.  HENT:  Head: Normocephalic.  Eyes: Conjunctivae and EOM are normal. No scleral icterus.   Neck: Neck supple. No thyromegaly present.  Cardiovascular: Normal rate.  Exam reveals no gallop and no friction rub.   No murmur heard. Irregular heart beat  Pulmonary/Chest: No stridor. She has no wheezes. She has no rales. She exhibits no tenderness.  Abdominal: She exhibits no distension. There is no tenderness. There is no rebound.  Musculoskeletal: Normal range of motion. She exhibits no edema.  Lymphadenopathy:    She has no cervical adenopathy.  Neurological: She is oriented to person, place, and time. She exhibits normal muscle tone. Coordination normal.  Skin: No rash noted. No erythema.  Psychiatric: She has a normal mood and affect. Her behavior is normal.    ED Course  Procedures (including critical care time) Labs Review Labs Reviewed  COMPREHENSIVE METABOLIC PANEL - Abnormal; Notable for the following:    Potassium 3.3 (*)    GFR calc non Af Amer 77 (*)    GFR calc Af Amer 90 (*)    All other components within normal limits  URINALYSIS, ROUTINE W REFLEX MICROSCOPIC  CBC WITH DIFFERENTIAL/PLATELET    Imaging Review No results found.   EKG Interpretation   Date/Time:  Friday June 27 2014 12:59:47 EST Ventricular Rate:  98 PR Interval:    QRS Duration: 80 QT Interval:  398 QTC Calculation: 508 R Axis:   24 Text Interpretation:  Atrial fibrillation with premature ventricular or  aberrantly conducted complexes Cannot rule out Anterior infarct , age  undetermined Abnormal ECG Confirmed by Rawson Minix  MD, [place] [phone]) on  06/27/2014 4:58:07 PM      MDM   Final diagnoses:  Pain  Abdominal pain in female    Pt improved.  She does not want to get her x-rays.  She decided to leave ama    Maudry Diego, MD 06/27/14 [phone]

## 2014-06-27 NOTE — ED Notes (Signed)
Patient requesting to go home.  Declining xray.  States she feels worse now than when she came in because she is nervous.  Spoke w/Dr. Roderic Palau who states she will have to s/o AMA.  Informed patient of this and she still wants to go home.  Encouraged her to stay and finish being worked up, but she adamantly declines.

## 2014-06-27 NOTE — ED Notes (Signed)
Patient states "I woke up with my heart beating real fast but I took my metroprolol and it helped but my stomach has been hurting for several months and it is worse today." Patient complaining of abdominal pain. Patient states "I feel like I have been using the bathroom a lot for a couple of days and I have UTI's sometimes."

## 2014-07-09 ENCOUNTER — Telehealth: Payer: Self-pay | Admitting: Family Medicine

## 2014-07-09 NOTE — Telephone Encounter (Signed)
Please explained to the patient that her insurance will no longer cover alprazolam. Lorazepam is a similar medication. I would recommend 0.5 mg #30, half a tablet twice a day when necessary anxiety. Even though this is a similar medicine the first time she takes this off to be at home only. If she feels she is having any problems with the medicine I would recommend stopping it and following up.

## 2014-07-09 NOTE — Telephone Encounter (Signed)
Pt's formulary exception request for ALPRAZolam (XANAX) 0.5 MG tablet was DENIED, please see denial letter in red folder, please advise

## 2014-07-10 MED ORDER — LORAZEPAM 0.5 MG PO TABS: ORAL_TABLET | ORAL | Status: DC

## 2014-07-10 NOTE — Telephone Encounter (Signed)
Medication changed. Pt notified.

## 2014-08-04 ENCOUNTER — Encounter: Payer: Self-pay | Admitting: Family Medicine

## 2014-08-04 ENCOUNTER — Ambulatory Visit (INDEPENDENT_AMBULATORY_CARE_PROVIDER_SITE_OTHER): Payer: Medicare Other | Admitting: Family Medicine

## 2014-08-04 VITALS — BP 132/82 | Ht 64.0 in | Wt 157.0 lb

## 2014-08-04 DIAGNOSIS — E876 Hypokalemia: Secondary | ICD-10-CM

## 2014-08-04 DIAGNOSIS — L989 Disorder of the skin and subcutaneous tissue, unspecified: Secondary | ICD-10-CM

## 2014-08-04 DIAGNOSIS — I4891 Unspecified atrial fibrillation: Secondary | ICD-10-CM

## 2014-08-04 MED ORDER — RIVAROXABAN 15 MG PO TABS: ORAL_TABLET | ORAL | Status: DC

## 2014-08-04 MED ORDER — ALPRAZOLAM 0.5 MG PO TABS: ORAL_TABLET | ORAL | Status: DC

## 2014-08-04 NOTE — Progress Notes (Addendum)
   Subjective:    Patient ID: [name], female    DOB: 1924-02-19, 81 y.o.   MRN: [id number]  HPI Patient arrives for a follow up on anxiety and a fib. No problems or concerns. She denies being depressed. She states she need a prescription for Xanax refills on her blood thinner. She denies any chest pressure tightness or pain or shortness of breath denies rectal bleeding On a recent ER visit her potassium was slightly low. She states her abdomen pain that she had when she went to the ER is resolved. Review of Systems She denies any chest tightness pressure pain shortness of breath she states she feels bad a lot but she think that's just getting older    Objective:   Physical Exam  Heart irregular rate controlled lungs clear pulse normal extremities no edema skin warm dry  Leg lesion noted on the left side. Appears potentially cancerous    Assessment & Plan:  Atrial fibrillation-she needs to stay on the blood thinner. She is tolerating this well. No sign of any bleeding issues. Follow-up in approximately 4-5 months  Her insurance will not pay for Xanax. She tried lorazepam at that cause serious side effects of therefore she would like to do Xanax and she'll pay for it out of pocket. She was given a preprinted prescription  Low potassium on recent blood draw a recheck metabolic 7  Patient also has a leg lesion on the left lower leg. I talked with her at length about how this needs to be removed. I also told her how to safely be off of her Xarelto in order to have it removed. She will stop it 2 days before the procedure resume it the following day. We will set her up with dermatology for removal.

## 2014-08-04 NOTE — Addendum Note (Signed)
Addended by: Sallee Lange A on: 08/04/2014 06:15 PM   Modules accepted: Orders, Level of Service

## 2014-08-05 ENCOUNTER — Encounter: Payer: Self-pay | Admitting: Family Medicine

## 2014-08-05 LAB — BASIC METABOLIC PANEL
BUN / CREAT RATIO: 18 (ref 11–26)
BUN: 13 mg/dL (ref 8–27)
CHLORIDE: 102 mmol/L (ref 97–108)
CO2: 23 mmol/L (ref 18–29)
Calcium: 9.6 mg/dL (ref 8.7–10.3)
Creatinine, Ser: 0.73 mg/dL (ref 0.57–1.00)
GFR, EST AFRICAN AMERICAN: 85 mL/min/{1.73_m2} (ref >59)
GFR, EST NON AFRICAN AMERICAN: 74 mL/min/{1.73_m2} (ref >59)
Glucose: 107 mg/dL — ABNORMAL HIGH (ref 65–99)
POTASSIUM: 4 mmol/L (ref 3.5–5.2)
SODIUM: 144 mmol/L (ref 134–144)

## 2014-08-06 ENCOUNTER — Other Ambulatory Visit: Payer: Self-pay | Admitting: Dermatology

## 2014-08-07 ENCOUNTER — Telehealth: Payer: Self-pay | Admitting: Cardiovascular Disease

## 2014-08-07 ENCOUNTER — Encounter: Payer: Self-pay | Admitting: Cardiovascular Disease

## 2014-08-07 NOTE — Telephone Encounter (Signed)
Patient explained that she is now going to another cardiologist

## 2014-08-13 ENCOUNTER — Encounter: Payer: Self-pay | Admitting: Family Medicine

## 2014-08-13 DIAGNOSIS — C4492 Squamous cell carcinoma of skin, unspecified: Secondary | ICD-10-CM | POA: Insufficient documentation

## 2014-11-01 ENCOUNTER — Emergency Department (HOSPITAL_COMMUNITY): Payer: Medicare Other

## 2014-11-01 ENCOUNTER — Encounter (HOSPITAL_COMMUNITY): Payer: Self-pay | Admitting: Emergency Medicine

## 2014-11-01 ENCOUNTER — Emergency Department (HOSPITAL_COMMUNITY)
Admission: EM | Admit: 2014-11-01 | Discharge: 2014-11-01 | Disposition: A | Discharge status: Home / Self Care | Payer: Medicare Other | Attending: Emergency Medicine | Admitting: Emergency Medicine

## 2014-11-01 DIAGNOSIS — Z880 Allergy status to penicillin: Secondary | ICD-10-CM | POA: Insufficient documentation

## 2014-11-01 DIAGNOSIS — Z853 Personal history of malignant neoplasm of breast: Secondary | ICD-10-CM | POA: Insufficient documentation

## 2014-11-01 DIAGNOSIS — I4891 Unspecified atrial fibrillation: Secondary | ICD-10-CM | POA: Diagnosis not present

## 2014-11-01 DIAGNOSIS — Z7901 Long term (current) use of anticoagulants: Secondary | ICD-10-CM | POA: Diagnosis not present

## 2014-11-01 DIAGNOSIS — G8929 Other chronic pain: Secondary | ICD-10-CM | POA: Diagnosis not present

## 2014-11-01 DIAGNOSIS — K219 Gastro-esophageal reflux disease without esophagitis: Secondary | ICD-10-CM | POA: Diagnosis not present

## 2014-11-01 DIAGNOSIS — I10 Essential (primary) hypertension: Secondary | ICD-10-CM | POA: Insufficient documentation

## 2014-11-01 DIAGNOSIS — R0602 Shortness of breath: Secondary | ICD-10-CM | POA: Diagnosis present

## 2014-11-01 DIAGNOSIS — M810 Age-related osteoporosis without current pathological fracture: Secondary | ICD-10-CM | POA: Insufficient documentation

## 2014-11-01 DIAGNOSIS — Z79899 Other long term (current) drug therapy: Secondary | ICD-10-CM | POA: Diagnosis not present

## 2014-11-01 LAB — CBC WITH DIFFERENTIAL/PLATELET
Basophils Absolute: 0 10*3/uL (ref 0.0–0.1)
Basophils Relative: 0 % (ref 0–1)
Eosinophils Absolute: 0.1 10*3/uL (ref 0.0–0.7)
Eosinophils Relative: 1 % (ref 0–5)
HCT: 39.5 % (ref 36.0–46.0)
HEMOGLOBIN: 13.1 g/dL (ref 12.0–15.0)
Lymphocytes Relative: 24 % (ref 12–46)
Lymphs Abs: 1.6 10*3/uL (ref 0.7–4.0)
MCH: 28.7 pg (ref 26.0–34.0)
MCHC: 33.2 g/dL (ref 30.0–36.0)
MCV: 86.6 fL (ref 78.0–100.0)
MONOS PCT: 5 % (ref 3–12)
Monocytes Absolute: 0.4 10*3/uL (ref 0.1–1.0)
NEUTROS PCT: 70 % (ref 43–77)
Neutro Abs: 4.6 10*3/uL (ref 1.7–7.7)
Platelets: 241 10*3/uL (ref 150–400)
RBC: 4.56 MIL/uL (ref 3.87–5.11)
RDW: 12.8 % (ref 11.5–15.5)
WBC: 6.6 10*3/uL (ref 4.0–10.5)

## 2014-11-01 LAB — BASIC METABOLIC PANEL
ANION GAP: 10 (ref 5–15)
BUN: 15 mg/dL (ref 6–20)
CHLORIDE: 103 mmol/L (ref 101–111)
CO2: 25 mmol/L (ref 22–32)
Calcium: 8.9 mg/dL (ref 8.9–10.3)
Creatinine, Ser: 0.75 mg/dL (ref 0.44–1.00)
GFR calc Af Amer: >60 mL/min (ref >60)
GFR calc non Af Amer: >60 mL/min (ref >60)
Glucose, Bld: 167 mg/dL — ABNORMAL HIGH (ref 65–99)
Potassium: 3.9 mmol/L (ref 3.5–5.1)
SODIUM: 138 mmol/L (ref 135–145)

## 2014-11-01 LAB — TROPONIN I: Troponin I: <0.03 ng/mL (ref <0.031)

## 2014-11-01 NOTE — ED Notes (Signed)
Irregular heart rate and SOB this am.  Denies any pain at this time.

## 2014-11-01 NOTE — Discharge Instructions (Signed)
Tests were good. Rest. Increase fluids. Follow-up your primary care doctor next week. Stay out of the heat. Return if worse

## 2014-11-01 NOTE — ED Provider Notes (Signed)
CSN: [id number]     Arrival date & time 11/01/14  1224 History   First MD Initiated Contact with Patient 11/01/14 1300     Chief Complaint  Patient presents with  . Irregular Heart Beat  . Shortness of Breath     (Consider location/radiation/quality/duration/timing/severity/associated sxs/prior Treatment) HPI..... Sense of rapid heart rate earlier this morning with associated dyspnea. Symptoms are now improved. No anterior chest pain. Patient has a history of atrial fibrillation with occasional associated tachycardia. She is on Xarelto.   Severity is moderate. Nothing makes symptoms better or worse.  Past Medical History  Diagnosis Date  . Hypertension   . Atrial fibrillation   . Cancer     left side  . GERD (gastroesophageal reflux disease)   . Hyperlipidemia   . Osteoporosis   . Osteopenia   . Edema   . Chronic back pain   . Abdominal pain, chronic, right lower quadrant     "ever since I had my appendix removed"   Past Surgical History  Procedure Laterality Date  . Breast surgery    . Appendectomy    . Hernia repair    . Colon surgery     Family History  Problem Relation Age of Onset  . Heart disease    . Arthritis    . Cancer    . Diabetes Mother   . Hyperlipidemia Brother    History  Substance Use Topics  . Smoking status: Never Smoker   . Smokeless tobacco: Never Used  . Alcohol Use: No   OB History    Gravida Para Term Preterm AB TAB SAB Ectopic Multiple Living   2 1   1  1   1      Review of Systems  All other systems reviewed and are negative.     Allergies  Amoxicillin; Cephalosporins; Hydrocodone; Lipitor; Lorazepam; Lovenox; Morphine and related; Penicillins; and Sulfa antibiotics  Home Medications   Prior to Admission medications   Medication Sig Start Date End Date Taking? Authorizing Provider  ALPRAZolam (XANAX) 0.5 MG tablet TAKE (1/2) TO (1) TABLET TWICE DAILY AS NEEDED. Patient taking differently: Take 0.5 mg by mouth at bedtime.  TAKE (1/2) TO (1) TABLET TWICE DAILY AS NEEDED. 08/04/14  Yes [name], MD  metoprolol succinate (TOPROL-XL) 25 MG 24 hr tablet TAKE ONE TABLET BY MOUTH ONCE DAILY. 05/21/14  Yes [name], MD  metoprolol tartrate (LOPRESSOR) 25 MG tablet Take 12.5 mg by mouth every 6 (six) hours as needed (palpitations).    Yes Historical Provider, MD  Rivaroxaban (XARELTO) 15 MG TABS tablet TAKE 1 TABLET ONCE DAILY WITH FOOD. 08/04/14  Yes [name], MD  traMADol (ULTRAM) 50 MG tablet TAKE (1) TABLET BY MOUTH EVERY (6) HOURS AS NEEDED. 05/08/14  Yes [name], MD  calcium carbonate (TUMS - DOSED IN MG ELEMENTAL CALCIUM) 500 MG chewable tablet Chew 2 tablets by mouth daily as needed for indigestion or heartburn.    Historical Provider, MD  omeprazole (PRILOSEC) 20 MG capsule Take 20 mg by mouth daily as needed (acid reflux).     Historical Provider, MD  ranitidine (ZANTAC) 150 MG tablet Take 150 mg by mouth 2 (two) times daily.    Historical Provider, MD   BP 136/77 mmHg  Pulse 83  Temp(Src) 98.1 F (36.7 C) (Oral)  Resp 13  Ht 5\' 4"  (1.626 m)  Wt 150 lb (68.04 kg)  BMI 25.73 kg/m2  SpO2 95% Physical Exam  Constitutional: She  is oriented to person, place, and time. She appears well-developed and well-nourished.  HENT:  Head: Normocephalic and atraumatic.  Eyes: Conjunctivae and EOM are normal. Pupils are equal, round, and reactive to light.  Neck: Normal range of motion. Neck supple.  Cardiovascular: Normal rate.   Irregularly irregular  Pulmonary/Chest: Effort normal and breath sounds normal.  Abdominal: Soft. Bowel sounds are normal.  Musculoskeletal: Normal range of motion.  Neurological: She is alert and oriented to person, place, and time.  Skin: Skin is warm and dry.  Psychiatric: She has a normal mood and affect. Her behavior is normal.  Nursing note and vitals reviewed.   ED Course  Procedures (including critical care time) Labs Review Labs Reviewed  BASIC METABOLIC  PANEL - Abnormal; Notable for the following:    Glucose, Bld 167 (*)    All other components within normal limits  CBC WITH DIFFERENTIAL/PLATELET  TROPONIN I    Imaging Review Dg Chest 2 View  11/01/2014   CLINICAL DATA:  Irregular heartbeat and shortness of breath today. History of atrial fibrillation, pain and breast cancer.  EXAM: CHEST  2 VIEW  COMPARISON:  04/21/2014 and prior chest radiographs dating back to 04/01/2011.  FINDINGS: Mild cardiomegaly again noted.  Mild bibasilar scarring is unchanged.  There is no evidence of focal airspace disease, pulmonary edema, suspicious pulmonary nodule/mass, pleural effusion, or pneumothorax. No acute bony abnormalities are identified.  IMPRESSION: Mild cardiomegaly without acute cardiopulmonary disease.   Electronically Signed   By: Margarette Canada M.D.   On: 11/01/2014 14:18     EKG Interpretation   Date/Time:  Saturday November 01 2014 12:45:58 EDT Ventricular Rate:  98 PR Interval:    QRS Duration: 91 QT Interval:  403 QTC Calculation: 515 R Axis:   36 Text Interpretation:  Atrial fibrillation Borderline T wave abnormalities  Confirmed by Lacinda Axon  MD, Thoma Paulsen ([id number]) on 11/01/2014 12:59:55 PM      MDM   Final diagnoses:  Atrial fibrillation, unspecified    Patient is hemodynamically stable. EKG shows atrial fibrillation with a rate of 98. Patient feels much better. Hemoglobin stable. Glucose minimally elevated at 167.    Nat Christen, MD 11/01/14 1430

## 2014-11-01 NOTE — ED Notes (Signed)
MD at bedside. 

## 2014-11-06 ENCOUNTER — Ambulatory Visit (INDEPENDENT_AMBULATORY_CARE_PROVIDER_SITE_OTHER): Payer: Medicare Other | Admitting: Family Medicine

## 2014-11-06 ENCOUNTER — Encounter: Payer: Self-pay | Admitting: Family Medicine

## 2014-11-06 VITALS — BP 146/88 | Ht 64.0 in | Wt 158.0 lb

## 2014-11-06 DIAGNOSIS — F419 Anxiety disorder, unspecified: Secondary | ICD-10-CM

## 2014-11-06 DIAGNOSIS — I4891 Unspecified atrial fibrillation: Secondary | ICD-10-CM | POA: Diagnosis not present

## 2014-11-06 MED ORDER — METOPROLOL SUCCINATE ER 25 MG PO TB24: 25.0000 mg | ORAL_TABLET | Freq: Every day | ORAL | Status: DC

## 2014-11-06 MED ORDER — ESCITALOPRAM OXALATE 10 MG PO TABS: 10.0000 mg | ORAL_TABLET | Freq: Every day | ORAL | Status: DC

## 2014-11-06 MED ORDER — METOPROLOL TARTRATE 25 MG PO TABS: ORAL_TABLET | ORAL | Status: DC

## 2014-11-06 MED ORDER — METOPROLOL SUCCINATE ER 25 MG PO TB24: ORAL_TABLET | ORAL | Status: DC

## 2014-11-06 NOTE — Progress Notes (Signed)
   Subjective:    Patient ID: [name], female    DOB: 1924/03/06, 81 y.o.   MRN: [id number] Patient arrives office for several concerns. HPIFollow up ED visit for irregular heart rate. Pt needs refill on metoprolol 25 mg. Pt states she has two scripts for metoprolol.   Takes 25mg  every day XL form, and then if needed 1/2 regular short acting tablet every 6 hours.   Pt having anxiety and depression. . Problems sleeping at night. Uses Xanax. Now having more anxiety during the day. Admits to somewhat depressed mood.  Was on a nondepressed since in the past.    Review of Systems No headache no chest pain no back pain no fever no chills no rash ROS otherwise negative    Objective:   Physical Exam  Alert vital stable blood pressure improved on repeat 136/84. HEENT normal. Lungs clear. Heart irregular rhythm but not elevated heart rate      Assessment & Plan:  Impression A. fib #2 insomnia #3 anxiety #4 element of depression plan metoprolol when necessary for rapid heart rate long-acting metoprolol daily. Maintain nighttime Xanax. Add Lexapro rationale discussed WSL

## 2014-11-18 ENCOUNTER — Ambulatory Visit: Payer: Medicare Other | Admitting: Cardiology

## 2014-11-19 ENCOUNTER — Encounter: Payer: Self-pay | Admitting: Cardiology

## 2014-11-19 ENCOUNTER — Ambulatory Visit (INDEPENDENT_AMBULATORY_CARE_PROVIDER_SITE_OTHER): Payer: Medicare Other | Admitting: Cardiology

## 2014-11-19 VITALS — BP 144/80 | HR 76 | Ht 64.0 in | Wt 159.0 lb

## 2014-11-19 DIAGNOSIS — I4891 Unspecified atrial fibrillation: Secondary | ICD-10-CM | POA: Diagnosis not present

## 2014-11-19 DIAGNOSIS — R0609 Other forms of dyspnea: Secondary | ICD-10-CM | POA: Diagnosis not present

## 2014-11-19 DIAGNOSIS — I10 Essential (primary) hypertension: Secondary | ICD-10-CM

## 2014-11-19 DIAGNOSIS — R0600 Dyspnea, unspecified: Secondary | ICD-10-CM

## 2014-11-19 NOTE — Progress Notes (Signed)
Clinical Summary [name] is a 81 y.o.female   1. Afib - she is on Toprol XL with prn lopressor for rate control - stroke prevention with xarelto - seen in ER earlier this month with palpitations that resolved shortly after arriving. K 3.9, trop neg, Hgb 13.1. EKG afib rate 98.  - First episode of significant palpitations in quite some time, she states she had run out of her prn lopressor at that time as well - no recent symptoms  2. Anxiety - followed by Dr Wolfgang Phoenix  3. DOE - 12/2013 echo LVEF 60-65%, no WMAs,  - 01/2014 MPI no ischemia - notes some SOB with moderate levels of activity. No LE edema, no orthopnea, no PND  4. HTN - typically around 130s/70s - compliant with meds Past Medical History  Diagnosis Date  . Hypertension   . Atrial fibrillation   . Cancer     left side  . GERD (gastroesophageal reflux disease)   . Hyperlipidemia   . Osteoporosis   . Osteopenia   . Edema   . Chronic back pain   . Abdominal pain, chronic, right lower quadrant     "ever since I had my appendix removed"     Allergies  Allergen Reactions  . Amoxicillin Hives  . Cephalosporins Itching  . Hydrocodone Itching and Nausea Only    In large doses Made her feel like she was in somebody else's house when getting up at night after taking it  . Lipitor [Atorvastatin] Other (See Comments)    unknown  . Lorazepam Other (See Comments)    Exceptional fatigue   . Lovenox [Enoxaparin Sodium] Nausea And Vomiting  . Morphine And Related Itching  . Penicillins Swelling  . Sulfa Antibiotics Swelling     Current Outpatient Prescriptions  Medication Sig Dispense Refill  . ALPRAZolam (XANAX) 0.5 MG tablet TAKE (1/2) TO (1) TABLET TWICE DAILY AS NEEDED. (Patient taking differently: Take 0.5 mg by mouth at bedtime. TAKE (1/2) TO (1) TABLET TWICE DAILY AS NEEDED.) 40 tablet 4  . calcium carbonate (TUMS - DOSED IN MG ELEMENTAL CALCIUM) 500 MG chewable tablet Chew 2 tablets by mouth daily  as needed for indigestion or heartburn.    . escitalopram (LEXAPRO) 10 MG tablet Take 1 tablet (10 mg total) by mouth daily. 30 tablet 5  . metoprolol succinate (TOPROL XL) 25 MG 24 hr tablet Take 1 tablet (25 mg total) by mouth daily. 30 tablet 2  . metoprolol succinate (TOPROL-XL) 25 MG 24 hr tablet TAKE ONE HALF TAB EVERY 6 HOURS PRN INCREASE HEART RATE 30 tablet 2  . metoprolol tartrate (LOPRESSOR) 25 MG tablet TAKE ONE HALF TABLET EVERY 6 HOURS PRN INCREASED HEART RATE 30 tablet 2  . Rivaroxaban (XARELTO) 15 MG TABS tablet TAKE 1 TABLET ONCE DAILY WITH FOOD. 30 tablet 5   No current facility-administered medications for this visit.     Past Surgical History  Procedure Laterality Date  . Breast surgery    . Appendectomy    . Hernia repair    . Colon surgery       Allergies  Allergen Reactions  . Amoxicillin Hives  . Cephalosporins Itching  . Hydrocodone Itching and Nausea Only    In large doses Made her feel like she was in somebody else's house when getting up at night after taking it  . Lipitor [Atorvastatin] Other (See Comments)    unknown  . Lorazepam Other (See Comments)    Exceptional fatigue   .  Lovenox [Enoxaparin Sodium] Nausea And Vomiting  . Morphine And Related Itching  . Penicillins Swelling  . Sulfa Antibiotics Swelling      Family History  Problem Relation Age of Onset  . Heart disease    . Arthritis    . Cancer    . Diabetes Mother   . Hyperlipidemia Brother      Social History [name] reports that she has never smoked. She has never used smokeless tobacco. [name] reports that she does not drink alcohol.   Review of Systems CONSTITUTIONAL: No weight loss, fever, chills, weakness or fatigue.  HEENT: Eyes: No visual loss, blurred vision, double vision or yellow sclerae.No hearing loss, sneezing, congestion, runny nose or sore throat.  SKIN: No rash or itching.  CARDIOVASCULAR: per HPI RESPIRATORY: No shortness of breath, cough or  sputum.  GASTROINTESTINAL: No anorexia, nausea, vomiting or diarrhea. No abdominal pain or blood.  GENITOURINARY: No burning on urination, no polyuria NEUROLOGICAL: No headache, dizziness, syncope, paralysis, ataxia, numbness or tingling in the extremities. No change in bowel or bladder control.  MUSCULOSKELETAL: No muscle, back pain, joint pain or stiffness.  LYMPHATICS: No enlarged nodes. No history of splenectomy.  PSYCHIATRIC: No history of depression or anxiety.  ENDOCRINOLOGIC: No reports of sweating, cold or heat intolerance. No polyuria or polydipsia.  Marland Kitchen   Physical Examination Filed Vitals:   11/19/14 1001  BP: 144/80  Pulse: 76   Filed Vitals:   11/19/14 1001  Height: 5\' 4"  (1.626 m)  Weight: 159 lb (72.122 kg)    Gen: resting comfortably, no acute distress HEENT: no scleral icterus, pupils equal round and reactive, no palptable cervical adenopathy,  CV: irreg, no m/r/g, no JVD, no carotid bruits Resp: Clear to auscultation bilaterally GI: abdomen is soft, non-tender, non-distended, normal bowel sounds, no hepatosplenomegaly MSK: extremities are warm, no edema.  Skin: warm, no rash Neuro:  no focal deficits Psych: appropriate affect   Diagnostic Studies 01/2014 MPI IMPRESSION: 1. No reversible ischemia or infarction.  2. Normal left ventricular wall motion.  3. Left ventricular ejection fraction 43%, borderline/mildly decreased function by nuclear standards. Recommend correlate with echo.  4. Intermediaterisk stress test findings based on mildly decreased LV systolic function, there is no myocardium at jeopardy*.  12/2013 Echo Study Conclusions  - Left ventricle: The cavity size was normal. Wall thickness was increased in a pattern of mild LVH. Systolic function was normal. The estimated ejection fraction was in the range of 60% to 65%. Wall motion was normal; there were no regional wall motion abnormalities. - Aortic valve: Valve area (VTI):  1.92 cm^2. Valve area (Vmax): 1.95 cm^2. Valve area (Vmean): 2.07 cm^2. - Mitral valve: There was mild regurgitation. - Left atrium: The atrium was moderately dilated. - Right atrium: The atrium was mildly dilated. - Technically adequate study.   Assessment and Plan  1. Afib - one episode of palpitations earlier this month, she had run out of her prn lopressor - no recurrent symptoms, continue current therapy. Reports lower bp's on more aggressive daily AV nodal therapy - CHADS2Vasc score is 4, continue xarelto  2. DOE - symptoms seem improved since last visit, mainly occur with moderate levels of activity. Normal echo and stress test within the last year - continue to follow clinically  3. HTN - at goal, continue current meds   F/u 6 months   Arnoldo Lenis, M.D.

## 2014-11-19 NOTE — Patient Instructions (Signed)
Your physician wants you to follow-up in: 6 months with Dr. Branch. You will receive a reminder letter in the mail two months in advance. If you don't receive a letter, please call our office to schedule the follow-up appointment.  Your physician recommends that you continue on your current medications as directed. Please refer to the Current Medication list given to you today.   Thanks for choosing [facility]!!!    

## 2014-11-20 ENCOUNTER — Other Ambulatory Visit: Payer: Self-pay | Admitting: Family Medicine

## 2014-11-20 NOTE — Telephone Encounter (Signed)
Ok plus three monthly ref 

## 2014-12-02 ENCOUNTER — Ambulatory Visit (INDEPENDENT_AMBULATORY_CARE_PROVIDER_SITE_OTHER): Payer: Medicare Other | Admitting: Family Medicine

## 2014-12-02 ENCOUNTER — Encounter: Payer: Self-pay | Admitting: Family Medicine

## 2014-12-02 ENCOUNTER — Ambulatory Visit: Payer: Medicare Other | Admitting: Family Medicine

## 2014-12-02 VITALS — BP 122/82 | Ht 64.0 in | Wt 159.2 lb

## 2014-12-02 DIAGNOSIS — I480 Paroxysmal atrial fibrillation: Secondary | ICD-10-CM | POA: Diagnosis not present

## 2014-12-02 NOTE — Progress Notes (Signed)
   Subjective:    Patient ID: [name], female    DOB: 19-Nov-1923, 81 y.o.   MRN: [id number]  HPI  Patient arrives for a follow up on anxiety and a-fib. Patient recently had check up with Cardiology and doing well. Patient reports aches and pains and doesn't think lexapro is doing any good. She denies being depressed she just feels like a time she doesn't have much energy. She does some stop and then she has to sit around Review of Systems Denies sweats chills fevers cough shortness of breath just relates fatigue although this is been constant for months    Objective:   Physical Exam Lungs are clear heart rate controlled mild irregularity extremities no edema skin warm dry neurologic gross normal  It should be noted that I did review her CBC as well as metabolic 7 in I don't find any evidence of any issues recently I would not recommend repeating these labs currently     Assessment & Plan:  Atrial fibrillation-the patient is frustrated because she can't do as much as she used to this causes her to feel at times slightly depressed she denies being depressed currently. She states she does try to do some things but she has to rest often I believe it was partly related to her heart partly related to her age I gave her encouragement she ought to stick with her current regimen and follow-up with [place] again in 3 months

## 2015-02-02 ENCOUNTER — Other Ambulatory Visit: Payer: Self-pay | Admitting: Family Medicine

## 2015-02-02 NOTE — Telephone Encounter (Signed)
Ok this and 4 refills 

## 2015-02-02 NOTE — Telephone Encounter (Signed)
Last seen 12/02/14

## 2015-02-13 ENCOUNTER — Other Ambulatory Visit: Payer: Self-pay | Admitting: Family Medicine

## 2015-02-20 IMAGING — NM NM MYOCAR MULTI W/SPECT W/WALL MOTION & EF
2 series · 12 of 12 positions shown · non-contrast
Comparison: None.

CLINICAL DATA: 89-year-old female with no known history of coronary
artery disease referred for shortness of breath.

EXAM:
MYOCARDIAL IMAGING WITH SPECT (REST AND PHARMACOLOGIC-STRESS)
GATED LEFT VENTRICULAR WALL MOTION STUDY
LEFT VENTRICULAR EJECTION FRACTION
TECHNIQUE: Standard myocardial SPECT imaging was performed after resting
intravenous injection of 10 mCi Lc-88m sestamibi. Subsequently,
intravenous infusion of Lexiscan was performed under the supervision
of the Cardiology staff. At peak effect of the drug, 30 mCi Lc-88m
sestamibi was injected intravenously and standard myocardial SPECT
imaging was performed. Quantitative gated imaging was also performed
to evaluate left ventricular wall motion, and estimate left
ventricular ejection fraction.

[Series 1: rest · 8.28mm/px · 6 of 64 frames shown]
[frame 6/64]
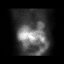
[frame 16/64]
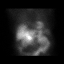
[frame 27/64]
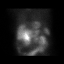
[frame 38/64]
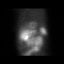
[frame 48/64]
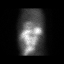
[frame 59/64]
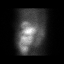

[Series 2: stress gated · 8.28mm/px · 6 of 64 frames shown]
[frame 6/64]
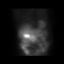
[frame 16/64]
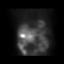
[frame 27/64]
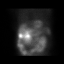
[frame 38/64]
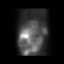
[frame 48/64]
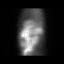
[frame 59/64]
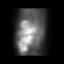

[12 of 12 positions shown; findings below may reference images not displayed]

FINDINGS: Pharmacological stress

Baseline EKG showed atrial fibrillation. After injection heart rate
increased from a baseline of 107 beats per min up to 160 beats per
min, and blood pressure increased from 111/91 up to 134/86. The test
was stopped after injection was completed, the patient did not
experience any chest pain. Post-injection EKG showed atrial
fibrillation with tachycardia, no specific ischemic changes.

Perfusion: No decreased activity in the left ventricle on stress
imaging to suggest reversible ischemia or infarction.

Wall Motion: Normal left ventricular wall motion. No left
ventricular dilation.

Left Ventricular Ejection Fraction: 43 %

End diastolic volume 62 ml

End systolic volume 35 ml
IMPRESSION: 1. No reversible ischemia or infarction.

2. Normal left ventricular wall motion.

3. Left ventricular ejection fraction 43%, borderline/mildly
decreased function by nuclear standards. Recommend correlate with
echo.

4. Intermediaterisk stress test findings based on mildly decreased
LV systolic function, there is no myocardium at jeopardy*.

*5735 Appropriate Use Criteria for Coronary Revascularization
Focused Update: J Am Coll Cardiol. 5735;59(9):857-881.
[URL]

## 2015-02-21 DIAGNOSIS — N83209 Unspecified ovarian cyst, unspecified side: Secondary | ICD-10-CM

## 2015-02-21 HISTORY — DX: Unspecified ovarian cyst, unspecified side: N83.209

## 2015-03-04 ENCOUNTER — Ambulatory Visit: Payer: Medicare Other | Admitting: Family Medicine

## 2015-03-06 ENCOUNTER — Telehealth: Payer: Self-pay | Admitting: *Deleted

## 2015-03-06 ENCOUNTER — Encounter: Payer: Self-pay | Admitting: Family Medicine

## 2015-03-06 ENCOUNTER — Other Ambulatory Visit: Payer: Self-pay | Admitting: Family Medicine

## 2015-03-06 ENCOUNTER — Ambulatory Visit (INDEPENDENT_AMBULATORY_CARE_PROVIDER_SITE_OTHER): Payer: Medicare Other | Admitting: Family Medicine

## 2015-03-06 VITALS — BP 142/80 | Ht 64.0 in | Wt 158.0 lb

## 2015-03-06 DIAGNOSIS — R739 Hyperglycemia, unspecified: Secondary | ICD-10-CM

## 2015-03-06 DIAGNOSIS — Z23 Encounter for immunization: Secondary | ICD-10-CM | POA: Diagnosis not present

## 2015-03-06 DIAGNOSIS — R1032 Left lower quadrant pain: Secondary | ICD-10-CM | POA: Diagnosis not present

## 2015-03-06 LAB — POCT GLYCOSYLATED HEMOGLOBIN (HGB A1C): HEMOGLOBIN A1C: 5.6

## 2015-03-06 NOTE — Progress Notes (Signed)
   Subjective:    Patient ID: [name], female    DOB: 1923/08/09, 81 y.o.   MRN: [id number]  HPIpt having pain in both sides and low back pain. Taking tramadol 50mg  once daily.patient relates taking tramadol intermittently for back pain and it does help the back pain does radiate into her hips worse with walking  Patient with lower abdominal pain and discomfort left lower abdominal region pelvic region she states it's been progressive over the past 6 months along with a feeling of fullness in the lower abdomen and significant pain with movement but also relates the pain keeps her from resting and sometimes wakes her up there is no vomiting or diarrhea with some constipation. No bloody stools. Patient has not had this before until this hit over the past few months worse over the past 2 months. Has tried changing her diet without any success.    Dermatology treated her with a med for sedation/.Pt states meds make her feel sluggish. Pt fell at home.patient states she hit her head and her elbow and leg this happens several weeks back she feels fine now  Right eye drainage. Started this am.   Prediabetes. A1C todaypatient watching diet trying to stay active but she relates her age is catching up with her. Flu vaccine today.    Review of Systems  Constitutional: Negative for activity change, appetite change and fatigue.  HENT: Negative for congestion.   Respiratory: Negative for cough.   Cardiovascular: Negative for chest pain.  Gastrointestinal: Negative for abdominal pain.  Endocrine: Negative for polydipsia and polyphagia.  Neurological: Negative for weakness.  Psychiatric/Behavioral: Negative for confusion.       Objective:   Physical Exam  Constitutional: She appears well-nourished. No distress.  Cardiovascular: Normal rate and normal heart sounds.   No murmur heard. Pulmonary/Chest: Effort normal and breath sounds normal. No respiratory distress.  Abdominal: She exhibits no  distension. There is tenderness. There is no rebound and no guarding.  Musculoskeletal: She exhibits no edema.  Lymphadenopathy:    She has no cervical adenopathy.  Neurological: She is alert. She exhibits normal muscle tone.  Psychiatric: Her behavior is normal.  Vitals reviewed. patient does have stye underneath right eye  Heart rate controlled No masses are felt the abdomen but there is tenderness in the left lower abdomen in the lower pelvis region. Concern for the possibility of diverticular disease but also concern for the possibility of growth or a mass would be advisable to scan this region. Patient is on chronic blood thinner but I doubt that this is caused a hematoma.     Assessment & Plan:  1. Hyperglycemia A1c looks good continue current measures watch diet closely - POCT glycosylated hemoglobin (Hb A1C)  2. Encounter for immunization Flu vaccine given today.  3. Left lower quadrant pain Significant left lower quadrant and lower pelvic pain present for the past several months worse over the past month waking her up at night patient having tenderness on physical exam will need lab work as well as CAT scan of abdomen and pelvis. Await the results. - CBC with Differential/Platelet - Hepatic function panel - Basic metabolic panel - Lipase - CT Abdomen Pelvis W Contrast  Stye-doxycycline twice a day for 7 days with food and a glass water 25 minutes was spent with the patient. Greater than half the time was spent in discussion and answering questions and counseling regarding the issues that the patient came in for today.

## 2015-03-06 NOTE — Telephone Encounter (Signed)
May have this +5 refills 

## 2015-03-06 NOTE — Telephone Encounter (Signed)
[facility] to notify pt that her ct scan is at [facility] on oct 20th at 2:15. Pt needs to pick up contrast before the 20th.

## 2015-03-09 NOTE — Telephone Encounter (Signed)
Spoke with patient and informed her of her CT Scan appointment on October 20th at 2:15 at [facility]. Also informed patient to pick up oral contrast on October 19th. Patient verbalized understanding.

## 2015-03-10 LAB — CBC WITH DIFFERENTIAL/PLATELET
BASOS ABS: 0 10*3/uL (ref 0.0–0.2)
Basos: 0 %
EOS (ABSOLUTE): 0.1 10*3/uL (ref 0.0–0.4)
Eos: 1 %
Hematocrit: 41 % (ref 34.0–46.6)
Hemoglobin: 13.8 g/dL (ref 11.1–15.9)
IMMATURE GRANULOCYTES: 0 %
Immature Grans (Abs): 0 10*3/uL (ref 0.0–0.1)
LYMPHS ABS: 2.3 10*3/uL (ref 0.7–3.1)
Lymphs: 28 %
MCH: 28.3 pg (ref 26.6–33.0)
MCHC: 33.7 g/dL (ref 31.5–35.7)
MCV: 84 fL (ref 79–97)
Monocytes Absolute: 0.8 10*3/uL (ref 0.1–0.9)
Monocytes: 9 %
Neutrophils Absolute: 4.9 10*3/uL (ref 1.4–7.0)
Neutrophils: 62 %
Platelets: 256 10*3/uL (ref 150–379)
RBC: 4.88 x10E6/uL (ref 3.77–5.28)
RDW: 13.7 % (ref 12.3–15.4)
WBC: 8.1 10*3/uL (ref 3.4–10.8)

## 2015-03-10 LAB — HEPATIC FUNCTION PANEL
ALBUMIN: 4.2 g/dL (ref 3.5–4.7)
ALT: 10 IU/L (ref 0–32)
AST: 17 IU/L (ref 0–40)
Alkaline Phosphatase: 69 IU/L (ref 39–117)
BILIRUBIN TOTAL: 0.5 mg/dL (ref 0.0–1.2)
BILIRUBIN, DIRECT: 0.13 mg/dL (ref 0.00–0.40)
Total Protein: 6.8 g/dL (ref 6.0–8.5)

## 2015-03-10 LAB — BASIC METABOLIC PANEL
BUN / CREAT RATIO: 15 (ref 11–26)
BUN: 15 mg/dL (ref 8–27)
CALCIUM: 9.4 mg/dL (ref 8.7–10.3)
CO2: 24 mmol/L (ref 18–29)
Chloride: 100 mmol/L (ref 97–106)
Creatinine, Ser: 0.99 mg/dL (ref 0.57–1.00)
GFR, EST AFRICAN AMERICAN: 58 mL/min/{1.73_m2} — AB (ref >59)
GFR, EST NON AFRICAN AMERICAN: 51 mL/min/{1.73_m2} — AB (ref >59)
Glucose: 100 mg/dL — ABNORMAL HIGH (ref 65–99)
Potassium: 4.3 mmol/L (ref 3.5–5.2)
Sodium: 141 mmol/L (ref 136–144)

## 2015-03-10 LAB — LIPASE: Lipase: 23 U/L (ref 0–59)

## 2015-03-12 ENCOUNTER — Ambulatory Visit (HOSPITAL_COMMUNITY): Payer: Medicare Other

## 2015-03-19 ENCOUNTER — Encounter: Payer: Self-pay | Admitting: Family Medicine

## 2015-04-04 ENCOUNTER — Emergency Department (HOSPITAL_COMMUNITY): Payer: Medicare Other

## 2015-04-04 ENCOUNTER — Encounter (HOSPITAL_COMMUNITY): Payer: Self-pay | Admitting: Emergency Medicine

## 2015-04-04 ENCOUNTER — Emergency Department (HOSPITAL_COMMUNITY)
Admission: EM | Admit: 2015-04-04 | Discharge: 2015-04-04 | Disposition: A | Discharge status: Home / Self Care | Payer: Medicare Other | Attending: Emergency Medicine | Admitting: Emergency Medicine

## 2015-04-04 DIAGNOSIS — Z8739 Personal history of other diseases of the musculoskeletal system and connective tissue: Secondary | ICD-10-CM | POA: Insufficient documentation

## 2015-04-04 DIAGNOSIS — Z880 Allergy status to penicillin: Secondary | ICD-10-CM | POA: Insufficient documentation

## 2015-04-04 DIAGNOSIS — Z7901 Long term (current) use of anticoagulants: Secondary | ICD-10-CM | POA: Insufficient documentation

## 2015-04-04 DIAGNOSIS — I4891 Unspecified atrial fibrillation: Secondary | ICD-10-CM | POA: Diagnosis not present

## 2015-04-04 DIAGNOSIS — Z859 Personal history of malignant neoplasm, unspecified: Secondary | ICD-10-CM | POA: Insufficient documentation

## 2015-04-04 DIAGNOSIS — R1030 Lower abdominal pain, unspecified: Secondary | ICD-10-CM | POA: Insufficient documentation

## 2015-04-04 DIAGNOSIS — Z933 Colostomy status: Secondary | ICD-10-CM | POA: Insufficient documentation

## 2015-04-04 DIAGNOSIS — Z79899 Other long term (current) drug therapy: Secondary | ICD-10-CM | POA: Diagnosis not present

## 2015-04-04 DIAGNOSIS — G8929 Other chronic pain: Secondary | ICD-10-CM | POA: Diagnosis not present

## 2015-04-04 DIAGNOSIS — Z8719 Personal history of other diseases of the digestive system: Secondary | ICD-10-CM | POA: Insufficient documentation

## 2015-04-04 DIAGNOSIS — R1032 Left lower quadrant pain: Secondary | ICD-10-CM | POA: Diagnosis present

## 2015-04-04 DIAGNOSIS — Z8639 Personal history of other endocrine, nutritional and metabolic disease: Secondary | ICD-10-CM | POA: Diagnosis not present

## 2015-04-04 DIAGNOSIS — I10 Essential (primary) hypertension: Secondary | ICD-10-CM | POA: Diagnosis not present

## 2015-04-04 DIAGNOSIS — Z9049 Acquired absence of other specified parts of digestive tract: Secondary | ICD-10-CM | POA: Diagnosis not present

## 2015-04-04 LAB — COMPREHENSIVE METABOLIC PANEL
ALK PHOS: 65 U/L (ref 38–126)
ALT: 14 U/L (ref 14–54)
ANION GAP: 8 (ref 5–15)
AST: 22 U/L (ref 15–41)
Albumin: 3.9 g/dL (ref 3.5–5.0)
BILIRUBIN TOTAL: 0.9 mg/dL (ref 0.3–1.2)
BUN: 10 mg/dL (ref 6–20)
CALCIUM: 8.9 mg/dL (ref 8.9–10.3)
CO2: 28 mmol/L (ref 22–32)
CREATININE: 0.61 mg/dL (ref 0.44–1.00)
Chloride: 105 mmol/L (ref 101–111)
GFR calc non Af Amer: >60 mL/min (ref >60)
GLUCOSE: 136 mg/dL — AB (ref 65–99)
Potassium: 3.6 mmol/L (ref 3.5–5.1)
Sodium: 141 mmol/L (ref 135–145)
TOTAL PROTEIN: 7 g/dL (ref 6.5–8.1)

## 2015-04-04 LAB — URINALYSIS, ROUTINE W REFLEX MICROSCOPIC
Bilirubin Urine: NEGATIVE
GLUCOSE, UA: NEGATIVE mg/dL
KETONES UR: NEGATIVE mg/dL
Leukocytes, UA: NEGATIVE
Nitrite: NEGATIVE
PROTEIN: NEGATIVE mg/dL
Specific Gravity, Urine: <1.005 — ABNORMAL LOW (ref 1.005–1.030)
UROBILINOGEN UA: 0.2 mg/dL (ref 0.0–1.0)
pH: 6.5 (ref 5.0–8.0)

## 2015-04-04 LAB — CBC
HCT: 42.4 % (ref 36.0–46.0)
HEMOGLOBIN: 13.8 g/dL (ref 12.0–15.0)
MCH: 28.5 pg (ref 26.0–34.0)
MCHC: 32.5 g/dL (ref 30.0–36.0)
MCV: 87.4 fL (ref 78.0–100.0)
PLATELETS: 244 10*3/uL (ref 150–400)
RBC: 4.85 MIL/uL (ref 3.87–5.11)
RDW: 12.7 % (ref 11.5–15.5)
WBC: 6.8 10*3/uL (ref 4.0–10.5)

## 2015-04-04 LAB — URINE MICROSCOPIC-ADD ON

## 2015-04-04 LAB — LIPASE, BLOOD: Lipase: 20 U/L (ref 11–51)

## 2015-04-04 MED ORDER — IOHEXOL 300 MG/ML  SOLN
100.0000 mL | Freq: Once | INTRAMUSCULAR | Status: AC | PRN
  Administered 2015-04-04: 100 mL via INTRAVENOUS

## 2015-04-04 MED ORDER — SODIUM CHLORIDE 0.9 % IV BOLUS (SEPSIS)
500.0000 mL | Freq: Once | INTRAVENOUS | Status: AC
  Administered 2015-04-04: 500 mL via INTRAVENOUS

## 2015-04-04 MED ORDER — FENTANYL CITRATE (PF) 100 MCG/2ML IJ SOLN
50.0000 ug | Freq: Once | INTRAMUSCULAR | Status: AC
  Administered 2015-04-04: 50 ug via INTRAVENOUS
  Filled 2015-04-04: qty 2

## 2015-04-04 MED ORDER — ONDANSETRON HCL 4 MG/2ML IJ SOLN
4.0000 mg | Freq: Once | INTRAMUSCULAR | Status: AC
  Administered 2015-04-04: 4 mg via INTRAVENOUS
  Filled 2015-04-04: qty 2

## 2015-04-04 MED ORDER — TRAMADOL-ACETAMINOPHEN 37.5-325 MG PO TABS: 1.0000 | ORAL_TABLET | Freq: Four times a day (QID) | ORAL | Status: DC | PRN

## 2015-04-04 NOTE — ED Notes (Signed)
Pt completed, CT made aware.

## 2015-04-04 NOTE — ED Notes (Signed)
Having pain to left lower abdomen, rates pain 10/10.  History of colostomy 12 years ago.

## 2015-04-04 NOTE — Discharge Instructions (Signed)

## 2015-04-04 NOTE — ED Provider Notes (Signed)
CSN: [id number]     Arrival date & time 04/04/15  0907 History  By signing my name below, I, Julien Nordmann, attest that this documentation has been prepared under the direction and in the presence of Daleen Bo, MD. Electronically Signed: Julien Nordmann, ED Scribe. 04/05/2015. 3:24 PM.    Chief Complaint  Patient presents with  . Abdominal Pain     The history is provided by the patient. No language interpreter was used.   HPI Comments: [name] is a 81 y.o. female who has a hx of HTN, Afib, hyperlipidemia, and GERD presents to the Emergency Department complaining of constant, gradual worsening LLQ pain onset yesterday. Pt rates her current pain a 10/10. She states she used to have a colostomy bag in the area where she has pain. Pt has had an appetite but has not been eating much. Pt reports she is unable to ambulate without it hurting severely. She states lying down increases her pain. Pt states she has not had pain in that area in the past but she notes the pain is similar to when she ruptured her appendix. Pt currently takes xarelto. She denies injury to the area, colitis, constipation, fever, and cough. There are no other known modifying factors.  Past Medical History  Diagnosis Date  . Hypertension   . Atrial fibrillation ([place])   . Cancer ([place])     left side  . GERD (gastroesophageal reflux disease)   . Hyperlipidemia   . Osteoporosis   . Osteopenia   . Edema   . Chronic back pain   . Abdominal pain, chronic, right lower quadrant     "ever since I had my appendix removed"   Past Surgical History  Procedure Laterality Date  . Breast surgery    . Appendectomy    . Hernia repair    . Colon surgery     Family History  Problem Relation Age of Onset  . Heart disease    . Arthritis    . Cancer    . Diabetes Mother   . Hyperlipidemia Brother    Social History  Substance Use Topics  . Smoking status: Never Smoker   . Smokeless tobacco: Never Used  . Alcohol Use:  No   OB History    Gravida Para Term Preterm AB TAB SAB Ectopic Multiple Living   2 1   1  1   1      Review of Systems  Constitutional: Negative for fever.  Respiratory: Negative for cough.   Gastrointestinal: Positive for abdominal pain. Negative for constipation.  All other systems reviewed and are negative.     Allergies  Amoxicillin; Cephalosporins; Hydrocodone; Lipitor; Lorazepam; Lovenox; Morphine and related; Penicillins; and Sulfa antibiotics  Home Medications   Prior to Admission medications   Medication Sig Start Date End Date Taking? Authorizing Provider  ALPRAZolam (XANAX) 0.5 MG tablet TAKE (1/2) TO (1) TABLET TWICE DAILY AS NEEDED. 03/06/15  Yes [name], MD  escitalopram (LEXAPRO) 10 MG tablet Take 1 tablet (10 mg total) by mouth daily. 11/06/14  Yes Mikey Kirschner, MD  metoprolol tartrate (LOPRESSOR) 25 MG tablet TAKE ONE HALF TABLET EVERY 6 HOURS PRN INCREASED HEART RATE 11/06/14  Yes Mikey Kirschner, MD  Omega-3 Fatty Acids (OMEGA 3 PO) Take 1 capsule by mouth daily.   Yes Historical Provider, MD  Rivaroxaban (XARELTO) 15 MG TABS tablet TAKE 1 TABLET ONCE DAILY WITH FOOD. 08/04/14  Yes [name], MD  traMADol Veatrice Bourbon)  50 MG tablet TAKE (1) TABLET BY MOUTH EVERY (6) HOURS AS NEEDED. 02/03/15  Yes [name], MD  VITAMIN D, ERGOCALCIFEROL, PO Take 1 tablet by mouth daily.   Yes Historical Provider, MD  traMADol-acetaminophen (ULTRACET) 37.5-325 MG tablet Take 1 tablet by mouth every 6 (six) hours as needed. 04/04/15   Daleen Bo, MD   Triage vitals: BP 160/96 mmHg  Pulse 67  Temp(Src) 98.5 F (36.9 C) (Oral)  Resp 20  Ht 5\' 4"  (1.626 m)  Wt 150 lb (68.04 kg)  BMI 25.73 kg/m2  SpO2 93% Physical Exam  Constitutional: She is oriented to person, place, and time. She appears well-developed and well-nourished.  HENT:  Head: Normocephalic and atraumatic.  Eyes: Conjunctivae and EOM are normal. Pupils are equal, round, and reactive to light.   Neck: Normal range of motion and phonation normal. Neck supple.  Cardiovascular: Normal rate and regular rhythm.   Pulmonary/Chest: Effort normal and breath sounds normal. She exhibits no tenderness.  Abdominal: Soft. She exhibits no distension. There is tenderness. There is no rebound and no guarding.  Mild diffuse lower abdominal tenderness  Musculoskeletal: Normal range of motion.  Neurological: She is alert and oriented to person, place, and time. She exhibits normal muscle tone.  Skin: Skin is warm and dry.  Psychiatric: She has a normal mood and affect. Her behavior is normal. Judgment and thought content normal.  Nursing note and vitals reviewed.   ED Course  Procedures  DIAGNOSTIC STUDIES: Oxygen Saturation is 93% on RA, low by my interpretation. 10:01 AM 97% on RA  COORDINATION OF CARE:  10:01 AM Discussed treatment plan which includes urinalysis with pt at bedside and pt agreed to plan.  Medications  sodium chloride 0.9 % bolus 500 mL (0 mLs Intravenous Stopped 04/04/15 1314)  fentaNYL (SUBLIMAZE) injection 50 mcg (50 mcg Intravenous Given 04/04/15 1019)  ondansetron (ZOFRAN) injection 4 mg (4 mg Intravenous Given 04/04/15 1019)  iohexol (OMNIPAQUE) 300 MG/ML solution 100 mL (100 mLs Intravenous Contrast Given 04/04/15 1139)  fentaNYL (SUBLIMAZE) injection 50 mcg (50 mcg Intravenous Given 04/04/15 1315)    No data found.   2:32 PM Reevaluation with update and discussion. After initial assessment and treatment, an updated evaluation reveals no further complaints, she is comfortable now.Daleen Bo L     Labs Review Labs Reviewed  COMPREHENSIVE METABOLIC PANEL - Abnormal; Notable for the following:    Glucose, Bld 136 (*)    All other components within normal limits  URINALYSIS, ROUTINE W REFLEX MICROSCOPIC (NOT AT [facility]) - Abnormal; Notable for the following:    Specific Gravity, Urine <1.005 (*)    Hgb urine dipstick TRACE (*)    All other components within  normal limits  URINE MICROSCOPIC-ADD ON - Abnormal; Notable for the following:    Squamous Epithelial / LPF FEW (*)    Bacteria, UA FEW (*)    All other components within normal limits  LIPASE, BLOOD  CBC    Imaging Review Ct Abdomen Pelvis W Contrast  04/04/2015  CLINICAL DATA:  Acute left lower quadrant pain at the previous colostomy site. EXAM: CT ABDOMEN AND PELVIS WITH CONTRAST TECHNIQUE: Multidetector CT imaging of the abdomen and pelvis was performed using the standard protocol following bolus administration of intravenous contrast. CONTRAST:  197mL OMNIPAQUE IOHEXOL 300 MG/ML  SOLN COMPARISON:  10/03/2012 FINDINGS: Lower chest: Minor bibasilar atelectasis versus scarring, worse on the left. No lower lobe pneumonia, significant collapse or consolidation. Mild cardiac enlargement. No pericardial or  pleural effusion. Small hiatal hernia noted. Lower thoracic atherosclerosis evident. Hepatobiliary: Liver, collapsed gallbladder, and biliary system are within normal limits for age and demonstrate no acute process. No biliary dilatation or obstruction. Patent portal vein. Pancreas: No mass, inflammatory changes, or other significant abnormality. Spleen: Within normal limits in size and appearance. Adrenals/Urinary Tract: Normal adrenal glands for age. No renal obstruction or hydronephrosis. Symmetric enhancement and excretion. Incidental sub cm cortical cyst in the left kidney mid pole, image 24. Stomach/Bowel: Moderately large duodenal diverticulum again noted. No significant change. Negative for obstruction, dilatation, ileus pattern, or free air. Scattered colonic diverticulosis. No acute inflammatory process, wall thickening, fluid collection, or abscess. Stable midline lower abdominal/pelvic ventral hernia containing a loop of small bowel without associated obstruction or incarceration. Vascular/Lymphatic: Abdominal calcific atherosclerosis noted without occlusive process or aneurysm. No  adenopathy. Reproductive: Uterus normal in size for age. Left adnexal minimally complex septated ovarian cyst measures 3.8 x 2.1 cm, image 59. No pelvic free fluid, fluid collection, hemorrhage, or abscess. Other: No inguinal abnormality or inguinal hernia. Musculoskeletal: Diffuse degenerative changes of the spine. L5-S1 vacuum disc phenomenon noted. 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 IMPRESSION: Stable midline lower abdominal/pelvic ventral wall hernia containing a loop of small bowel without associated obstruction or incarceration. Colonic diverticulosis Left ovarian/ adnexal complex cyst measuring 3.8 x 2.1 cm, in a postmenopausal female. This is concerning for developing ovarian cystic malignancy. Recommend follow-up nonemergent pelvic ultrasound along with gynecologic consultation. Electronically Signed   By: Jerilynn Mages.  Shick M.D.   On: 04/04/2015 12:31   I have personally reviewed and evaluated these images and lab results as part of my medical decision-making.   EKG Interpretation None      MDM   Final diagnoses:  Lower abdominal pain    Nonspecific lower abdominal pain. Ovarian cyst is present, nonspecific, and can be evaluated as an outpatient. No sign of bowel obstruction to raise concerns for her abdominal wall hernia. Doubt serous bacterial infection. Metabolic instability or impending vascular collapse.  Nursing Notes Reviewed/ Care Coordinated Applicable Imaging Reviewed Interpretation of Laboratory Data incorporated into ED treatment  The patient appears reasonably screened and/or stabilized for discharge and I doubt any other medical condition or other [facility] requiring further screening, evaluation, or treatment in the ED at this time prior to discharge.  Plan: Home Medications- usual; Home Treatments- rest; return here if the recommended treatment, does not improve the symptoms; Recommended follow up- PCP 1 week for check up and consider Pelvic U/S to f/u on left ovarian  cyst.    I personally performed the services described in this documentation, which was scribed in my presence. The recorded information has been reviewed and is accurate.     Daleen Bo, MD 04/05/15 [phone]

## 2015-04-04 NOTE — ED Notes (Signed)
MD at bedside. 

## 2015-04-06 ENCOUNTER — Encounter: Payer: Self-pay | Admitting: Family Medicine

## 2015-04-06 ENCOUNTER — Ambulatory Visit (INDEPENDENT_AMBULATORY_CARE_PROVIDER_SITE_OTHER): Payer: Medicare Other | Admitting: Family Medicine

## 2015-04-06 ENCOUNTER — Other Ambulatory Visit: Payer: Self-pay

## 2015-04-06 ENCOUNTER — Ambulatory Visit (HOSPITAL_COMMUNITY): Payer: Medicare Other

## 2015-04-06 VITALS — Temp 98.5°F | Ht 64.0 in | Wt 160.0 lb

## 2015-04-06 DIAGNOSIS — R1032 Left lower quadrant pain: Secondary | ICD-10-CM

## 2015-04-06 DIAGNOSIS — N83202 Unspecified ovarian cyst, left side: Secondary | ICD-10-CM

## 2015-04-06 NOTE — Progress Notes (Signed)
   Subjective:    Patient ID: [name], female    DOB: May 13, 1924, 81 y.o.   MRN: [id number]  Hip Pain  The incident occurred 3 to 5 days ago. The incident occurred at home. There was no injury mechanism. The pain is present in the left hip (left side). The quality of the pain is described as aching. The pain is moderate. She reports no foreign bodies present. The symptoms are aggravated by weight bearing. Treatments tried: tramadol. The treatment provided no relief.   Patient was seen on 04/04/15 at [facility] ER.   Patient states left-sided pain discomfort sharp pain worse with certain movements better at rest better when sitting slightly forward or laying down with legs bent.  Patient and also been having some lower abdominal aching for several months that on her last visit I recommended a CAT scan she put it off once but then she went ahead and got a CAT scan done when she went to the ER. Review of Systems No vomiting no fever no diarrhea no bloody stools no cough wheezing or breathing issues appetite good no night sweats    Objective:   Physical Exam Patient has pain in the left flank side region when she tries to lay backwards she does not get this pain when she is sitting forward or when she walks slightly hunched forward. Lungs are clear heart regular abdomen soft should be noted that I did review over her x-rays and lab work.       Assessment & Plan:  Muscle in the left lower abdominal region. I find no evidence of any type of infection currently  CAT scan shows ovarian cyst patient not having any significant tenderness in that lower area so I doubt any type of compromise but I would recommend ultrasound to help rule out the possibility of a growth.

## 2015-04-08 ENCOUNTER — Other Ambulatory Visit: Payer: Self-pay | Admitting: Family Medicine

## 2015-04-08 ENCOUNTER — Ambulatory Visit (HOSPITAL_COMMUNITY)
Admission: RE | Admit: 2015-04-08 | Discharge: 2015-04-08 | Disposition: A | Discharge status: Home / Self Care | Payer: Medicare Other | Source: Ambulatory Visit | Attending: Family Medicine | Admitting: Family Medicine

## 2015-04-08 DIAGNOSIS — R1032 Left lower quadrant pain: Secondary | ICD-10-CM | POA: Diagnosis present

## 2015-04-08 DIAGNOSIS — N839 Noninflammatory disorder of ovary, fallopian tube and broad ligament, unspecified: Secondary | ICD-10-CM | POA: Diagnosis not present

## 2015-04-13 ENCOUNTER — Ambulatory Visit (INDEPENDENT_AMBULATORY_CARE_PROVIDER_SITE_OTHER): Payer: Medicare Other | Admitting: Family Medicine

## 2015-04-13 ENCOUNTER — Encounter: Payer: Self-pay | Admitting: Family Medicine

## 2015-04-13 VITALS — BP 170/100 | Ht 64.0 in | Wt 158.4 lb

## 2015-04-13 DIAGNOSIS — D4959 Neoplasm of unspecified behavior of other genitourinary organ: Secondary | ICD-10-CM

## 2015-04-13 NOTE — Progress Notes (Signed)
   Subjective:    Patient ID: [name], female    DOB: 28-Apr-1924, 81 y.o.   MRN: [id number]  Abdominal Pain This is a recurrent problem. The pain is moderate.   Patient in to discuss abdominal ultrasound results. Patient states she is still experiencing abdominal pain.   States no other concerns this visit.   Review of Systems  Gastrointestinal: Positive for abdominal pain.       Objective:   Physical Exam Lungs are clear heart the rate is controlled it's irregular extremities no edema 1520 minutes spent with patient discussing her CAT scan ultrasound       Assessment & Plan:  This is concerning for the possibility of ovarian cancer I would not recommend any further testing other than the see gynecologic oncology. If they do recommend having a surgery she will also need to see cardiology to get pre-cardiology clearance. She is going to let us know the name of cardiology she would like to see.

## 2015-04-20 ENCOUNTER — Encounter: Payer: Self-pay | Admitting: Gynecologic Oncology

## 2015-04-20 ENCOUNTER — Ambulatory Visit: Payer: Medicare Other | Attending: Gynecologic Oncology | Admitting: Gynecologic Oncology

## 2015-04-20 VITALS — BP 178/90 | HR 66 | Temp 97.9°F | Resp 18 | Ht 64.0 in | Wt 160.8 lb

## 2015-04-20 DIAGNOSIS — D4959 Neoplasm of unspecified behavior of other genitourinary organ: Secondary | ICD-10-CM

## 2015-04-20 NOTE — Patient Instructions (Signed)
We will fax your lab test request to [facility] per Dr Everitt Amber , we will call you with the results . Please call with any questions or concerns.  Thank you!

## 2015-04-20 NOTE — Progress Notes (Signed)
Consult Note: Gyn-Onc  Consult was requested by Dr. Wolfgang Phoenix for the evaluation of [name] 81 y.o. female with a left ovarian cyst  CC:  Chief Complaint  Patient presents with  . ovarian tumor    New Consult    Assessment/Plan:  [name]  is a 81 y.o.  year old with a left ovarian cyst on imaging.  I personally reviewed her CT images from the CT abdo/pelvis on 04/04/15. I discussed with the patient that I have a very low suspicion for malignancy based on its appearance on imaging. I also do not believe that it is causing her symptoms of abdominal pain. I believe that her pain is likely secondary to adhesive disease and her persistent ventral hernia secondary to extensive operative history.  We will check a CA125 assay and if this is reassuringly low, she requires no further followup of these cysts.  If it is elevated, I recommend repeat imaging and CA125 assessment in 3 months to monitor for a trend.  Because of my low suspicion of malignancy and this patient's very complex prior surgical history, I do not believe she is a good candidate for an elective surgical procedure (eg elective or diagnostic BSO) because this would certainly be associated with far greater morbidity than she would experience with a more conservative, nonoperative approach. She has likely very extensive adhesive disease, abdominal mesh, and multiple prior bowel resections. She is at particularly high risk for laparotomy, bowel resection, bowel injury, reoperation, and wound failure. Additionally, she has a complex cardiac history (which she is not able to provide me with details other than her "heart stopped during 2 of my other surgeries").  We will followup the CA 125 and plan on either followup with me in 3 months (if elevated) versus no further followup if normal.   HPI: [name] is a 81 year old woman who is seen in consultation at the request of Dr Sallee Lange for a left ovarian cyst (4cm).  The patient reports having 1 year of abdominal pain (across mid abdomen bilaterally) and left hip pain. As part of workup of this a CT of the abdo/pelvis was ordered on 04/04/15. It showed a uterus normal in size for age. A left ovarian complex septated cyst measuring 3.8 x 2.1 cm. There is no free fluid, omental cake, adenopathy, or other signs of metastatic ovarian cancer. There is a stable midline lower abdominal pelvic ventral wall hernia containing a small loop of bowel without associated obstruction or incarceration.  A transvaginal ultrasound was performed on 04/08/2015. This demonstrated a uterus measuring 8.6 x 3.2 x 3.9 cm, an 8 mm endometrial stripe. The right ovary measured 2.1 x 1.5 x 1.6 cm. The left ovary measured 4 x 3.4 x 3.0 cm. The left ovary was involved with a complex lesion.  She denies vaginal bleeding.  The patient has a very complex past surgical history. She reports having a sigmoid colon abscess in the past that was resected with a colectomy and reanastomosis. She then developed a ventral abdominal hernia which required of hernia repair with mesh [place] at she reports having 3 separate hernia surgeries in the past. Approximate 6 years ago she had a ruptured appendix and this was also repaired with the addition of mesh.  The patient has cardiac history significant for atrial fibrillation and irregular heartbeat. She reports that her heart stopped twice during 2 of her surgeries in the past. She denies having history of coronary artery disease  or MIs. She does takes Xarelto.  Current Meds:  Outpatient Encounter Prescriptions as of 04/20/2015  Medication Sig  . ALPRAZolam (XANAX) 0.5 MG tablet TAKE (1/2) TO (1) TABLET TWICE DAILY AS NEEDED.  . metoprolol succinate (TOPROL-XL) 25 MG 24 hr tablet   . metoprolol tartrate (LOPRESSOR) 25 MG tablet TAKE ONE HALF TABLET EVERY 6 HOURS PRN INCREASED HEART RATE  . Rivaroxaban (XARELTO) 15 MG TABS tablet TAKE 1 TABLET ONCE DAILY WITH FOOD.   Marland Kitchen traMADol-acetaminophen (ULTRACET) 37.5-325 MG tablet Take 1 tablet by mouth every 6 (six) hours as needed.  Marland Kitchen VITAMIN D, ERGOCALCIFEROL, PO Take 1 tablet by mouth daily.  Marland Kitchen escitalopram (LEXAPRO) 10 MG tablet   . Omega-3 Fatty Acids (OMEGA 3 PO) Take 1 capsule by mouth daily.  . traMADol (ULTRAM) 50 MG tablet   . [DISCONTINUED] escitalopram (LEXAPRO) 10 MG tablet Take 1 tablet (10 mg total) by mouth daily.  . [DISCONTINUED] traMADol (ULTRAM) 50 MG tablet TAKE (1) TABLET BY MOUTH EVERY (6) HOURS AS NEEDED.   No facility-administered encounter medications on file as of 04/20/2015.    Allergy:  Allergies  Allergen Reactions  . Amoxicillin Hives  . Cephalosporins Itching  . Hydrocodone Itching and Nausea Only    In large doses Made her feel like she was in somebody else's house when getting up at night after taking it  . Lipitor [Atorvastatin] Other (See Comments)    unknown  . Lorazepam Other (See Comments)    Exceptional fatigue   . Lovenox [Enoxaparin Sodium] Nausea And Vomiting  . Morphine And Related Itching  . Penicillins Swelling  . Sulfa Antibiotics Swelling    Social Hx:   Social History   Social History  . Marital Status: Widowed    Spouse Name: N/A  . Number of Children: 1  . Years of Education: N/A   Occupational History  .     Social History Main Topics  . Smoking status: Never Smoker   . Smokeless tobacco: Never Used  . Alcohol Use: No  . Drug Use: No  . Sexual Activity: No   Other Topics Concern  . Not on file   Social History Narrative    Past Surgical Hx:  Past Surgical History  Procedure Laterality Date  . Breast surgery    . Appendectomy    . Hernia repair    . Colon surgery      Past Medical Hx:  Past Medical History  Diagnosis Date  . Hypertension   . Atrial fibrillation ([place])   . Cancer ([place])     left side  . GERD (gastroesophageal reflux disease)   . Hyperlipidemia   . Osteoporosis   . Osteopenia   . Edema   . Chronic  back pain   . Abdominal pain, chronic, right lower quadrant     "ever since I had my appendix removed"    Past Gynecological History:   No LMP recorded. Patient is postmenopausal.  Family Hx:  Family History  Problem Relation Age of Onset  . Heart disease    . Arthritis    . Cancer    . Diabetes Mother   . Hyperlipidemia Brother     Review of Systems:  Constitutional  Feels well,    ENT Normal appearing ears and nares bilaterally Skin/Breast  No rash, sores, jaundice, itching, dryness Cardiovascular  No chest pain, shortness of breath, or edema  Pulmonary  No cough or wheeze.  Gastro Intestinal  No nausea, vomitting, or diarrhoea.  No bright red blood per rectum, no abdominal pain, change in bowel movement, or constipation.  Genito Urinary  No frequency, urgency, dysuria, no postmenopausal bleeding Musculo Skeletal  No myalgia, arthralgia, joint swelling or pain  Neurologic  No weakness, numbness, change in gait,  Psychology  No depression, anxiety, insomnia.   Vitals:  Blood pressure 178/90, pulse 66, temperature 97.9 F (36.6 C), temperature source Oral, resp. rate 18, height 5\' 4"  (1.626 m), weight 160 lb 12.8 oz (72.938 kg), SpO2 97 %.  Physical Exam: WD in NAD Neck  Supple NROM, without any enlargements.  Lymph Node Survey No cervical supraclavicular or inguinal adenopathy Cardiovascular  Pulse normal rate, regularity and rhythm. S1 and S2 normal.  Lungs  Clear to auscultation bilateraly, without wheezes/crackles/rhonchi. Good air movement.  Skin  No rash/lesions/breakdown  Psychiatry  Alert and oriented to person, place, and time  Abdomen  Normoactive bowel sounds, abdomen soft, non-tender and obese without evidence of hernia. Mesh palpable through abdominal wall. Back No CVA tenderness Genito Urinary  Vulva/vagina: Normal external female genitalia.  No lesions. No discharge or bleeding.  Bladder/urethra:  No lesions or masses, well supported  bladder  Vagina: normal  Cervix: Normal appearing, no lesions.  Uterus: Small, mobile, no parametrial involvement or nodularity.  Adnexa: no palpable masses. Rectal  Good tone, no masses no cul de sac nodularity.  Extremities  No bilateral cyanosis, clubbing or edema.   Donaciano Eva, MD  04/20/2015, 4:58 PM

## 2015-04-21 ENCOUNTER — Telehealth: Payer: Self-pay | Admitting: *Deleted

## 2015-04-21 NOTE — Telephone Encounter (Signed)
Pt was called and advised to report to [facility] for a lab draw. No appointment is needed. Pt agreed and voiced understanding of instructions

## 2015-04-21 NOTE — Addendum Note (Signed)
Addended by: Joylene John D on: 04/21/2015 09:48 AM   Modules accepted: Orders

## 2015-04-23 ENCOUNTER — Other Ambulatory Visit (HOSPITAL_COMMUNITY)
Admission: RE | Admit: 2015-04-23 | Discharge: 2015-04-23 | Disposition: A | Discharge status: Home / Self Care | Payer: Medicare Other | Source: Ambulatory Visit | Attending: Gynecologic Oncology | Admitting: Gynecologic Oncology

## 2015-04-23 DIAGNOSIS — Z029 Encounter for administrative examinations, unspecified: Secondary | ICD-10-CM | POA: Insufficient documentation

## 2015-04-24 ENCOUNTER — Telehealth: Payer: Self-pay

## 2015-04-24 LAB — CA 125: CA 125: 18.6 U/mL (ref 0.0–38.1)

## 2015-04-24 NOTE — Telephone Encounter (Signed)
Orders received from [facility] ,APNP to contact the patient with CA 126 level was "normal" at ( 18.6 ) . Patient contacted and updated with Ca 125 level , patient denies further questions or concerns at this time.

## 2015-04-27 ENCOUNTER — Ambulatory Visit (INDEPENDENT_AMBULATORY_CARE_PROVIDER_SITE_OTHER): Payer: Medicare Other | Admitting: Family Medicine

## 2015-04-27 ENCOUNTER — Encounter: Payer: Self-pay | Admitting: Family Medicine

## 2015-04-27 VITALS — BP 142/76 | Ht 64.0 in | Wt 161.2 lb

## 2015-04-27 DIAGNOSIS — N3000 Acute cystitis without hematuria: Secondary | ICD-10-CM | POA: Diagnosis not present

## 2015-04-27 DIAGNOSIS — R1032 Left lower quadrant pain: Secondary | ICD-10-CM | POA: Diagnosis not present

## 2015-04-27 DIAGNOSIS — R109 Unspecified abdominal pain: Secondary | ICD-10-CM | POA: Diagnosis not present

## 2015-04-27 LAB — POCT URINALYSIS DIPSTICK
SPEC GRAV UA: 1.015
pH, UA: 6

## 2015-04-27 MED ORDER — CIPROFLOXACIN HCL 250 MG PO TABS: 250.0000 mg | ORAL_TABLET | Freq: Two times a day (BID) | ORAL | Status: DC

## 2015-04-27 NOTE — Progress Notes (Signed)
   Subjective:    Patient ID: [name], female    DOB: 1924-05-10, 81 y.o.   MRN: [id number]  HPI Long discussion held with patient some intermittent left lower abdominal pain especially when she tries to lay back she denies nausea vomiting diarrhea she did see a specialist who who did further testing and determined that the ovarian cyst is unlikely to be any type of cancer. Patient has some slight dysuria urinary frequency.   Review of Systems  Constitutional: Negative for activity change and appetite change.  Gastrointestinal: Negative for vomiting and abdominal pain.  Neurological: Negative for weakness.  Psychiatric/Behavioral: Negative for confusion.       Objective:   Physical Exam  Constitutional: She appears well-nourished. No distress.  HENT:  Head: Normocephalic.  Cardiovascular: Normal rate, regular rhythm and normal heart sounds.   No murmur heard. Pulmonary/Chest: Effort normal and breath sounds normal.  Musculoskeletal: She exhibits no edema.  Lymphadenopathy:    She has no cervical adenopathy.  Neurological: She is alert.  Psychiatric: Her behavior is normal.  Vitals reviewed.         Assessment & Plan:  Musculoskeletal groin pain she does not have any discomfort setting up a when she tries to lay back she has sharp pain in the left groin. Physical therapy would benefit her. Patient already had CAT scan which showed a left ovarian cyst but it is not felt to be causing her pain  Ovarian cyst CA 125 is negative not felt to be cancerous.  Possible UTI urine culture antibiotics prescribed follow-up if problems recheck in one month if high fevers or worse follow-up sooner

## 2015-04-29 LAB — URINE CULTURE

## 2015-04-29 LAB — PLEASE NOTE

## 2015-05-17 ENCOUNTER — Emergency Department (HOSPITAL_COMMUNITY): Payer: Medicare Other

## 2015-05-17 ENCOUNTER — Emergency Department (HOSPITAL_COMMUNITY)
Admission: EM | Admit: 2015-05-17 | Discharge: 2015-05-17 | Disposition: A | Discharge status: Home / Self Care | Payer: Medicare Other | Attending: Emergency Medicine | Admitting: Emergency Medicine

## 2015-05-17 ENCOUNTER — Encounter (HOSPITAL_COMMUNITY): Payer: Self-pay | Admitting: Emergency Medicine

## 2015-05-17 DIAGNOSIS — I10 Essential (primary) hypertension: Secondary | ICD-10-CM | POA: Insufficient documentation

## 2015-05-17 DIAGNOSIS — Z792 Long term (current) use of antibiotics: Secondary | ICD-10-CM | POA: Insufficient documentation

## 2015-05-17 DIAGNOSIS — Z8742 Personal history of other diseases of the female genital tract: Secondary | ICD-10-CM | POA: Insufficient documentation

## 2015-05-17 DIAGNOSIS — R1032 Left lower quadrant pain: Secondary | ICD-10-CM | POA: Diagnosis present

## 2015-05-17 DIAGNOSIS — E785 Hyperlipidemia, unspecified: Secondary | ICD-10-CM | POA: Insufficient documentation

## 2015-05-17 DIAGNOSIS — G8929 Other chronic pain: Secondary | ICD-10-CM | POA: Insufficient documentation

## 2015-05-17 DIAGNOSIS — Z8543 Personal history of malignant neoplasm of ovary: Secondary | ICD-10-CM | POA: Diagnosis not present

## 2015-05-17 DIAGNOSIS — Z7901 Long term (current) use of anticoagulants: Secondary | ICD-10-CM | POA: Insufficient documentation

## 2015-05-17 DIAGNOSIS — Z8719 Personal history of other diseases of the digestive system: Secondary | ICD-10-CM | POA: Diagnosis not present

## 2015-05-17 DIAGNOSIS — I4891 Unspecified atrial fibrillation: Secondary | ICD-10-CM | POA: Insufficient documentation

## 2015-05-17 DIAGNOSIS — Z79899 Other long term (current) drug therapy: Secondary | ICD-10-CM | POA: Diagnosis not present

## 2015-05-17 DIAGNOSIS — R109 Unspecified abdominal pain: Secondary | ICD-10-CM

## 2015-05-17 DIAGNOSIS — Z8739 Personal history of other diseases of the musculoskeletal system and connective tissue: Secondary | ICD-10-CM | POA: Diagnosis not present

## 2015-05-17 DIAGNOSIS — Z8589 Personal history of malignant neoplasm of other organs and systems: Secondary | ICD-10-CM | POA: Insufficient documentation

## 2015-05-17 DIAGNOSIS — Z880 Allergy status to penicillin: Secondary | ICD-10-CM | POA: Diagnosis not present

## 2015-05-17 HISTORY — DX: Unspecified ovarian cyst, unspecified side: N83.209

## 2015-05-17 HISTORY — DX: Unspecified abdominal pain: R10.9

## 2015-05-17 LAB — URINALYSIS, ROUTINE W REFLEX MICROSCOPIC
BILIRUBIN URINE: NEGATIVE
Glucose, UA: NEGATIVE mg/dL
KETONES UR: NEGATIVE mg/dL
NITRITE: NEGATIVE
Protein, ur: NEGATIVE mg/dL
pH: 6 (ref 5.0–8.0)

## 2015-05-17 LAB — COMPREHENSIVE METABOLIC PANEL
ALBUMIN: 3.9 g/dL (ref 3.5–5.0)
ALK PHOS: 57 U/L (ref 38–126)
ALT: 15 U/L (ref 14–54)
ANION GAP: 9 (ref 5–15)
AST: 20 U/L (ref 15–41)
BUN: 15 mg/dL (ref 6–20)
CO2: 27 mmol/L (ref 22–32)
Calcium: 8.9 mg/dL (ref 8.9–10.3)
Chloride: 103 mmol/L (ref 101–111)
Creatinine, Ser: 0.8 mg/dL (ref 0.44–1.00)
GFR calc Af Amer: >60 mL/min (ref >60)
GFR calc non Af Amer: >60 mL/min (ref >60)
GLUCOSE: 153 mg/dL — AB (ref 65–99)
POTASSIUM: 3.5 mmol/L (ref 3.5–5.1)
SODIUM: 139 mmol/L (ref 135–145)
Total Bilirubin: 0.7 mg/dL (ref 0.3–1.2)
Total Protein: 7.1 g/dL (ref 6.5–8.1)

## 2015-05-17 LAB — CBC WITH DIFFERENTIAL/PLATELET
BASOS PCT: 0 %
Basophils Absolute: 0 10*3/uL (ref 0.0–0.1)
Eosinophils Absolute: 0.1 10*3/uL (ref 0.0–0.7)
Eosinophils Relative: 1 %
HEMATOCRIT: 40.4 % (ref 36.0–46.0)
HEMOGLOBIN: 13.3 g/dL (ref 12.0–15.0)
Lymphocytes Relative: 31 %
Lymphs Abs: 2.4 10*3/uL (ref 0.7–4.0)
MCH: 28.9 pg (ref 26.0–34.0)
MCHC: 32.9 g/dL (ref 30.0–36.0)
MCV: 87.6 fL (ref 78.0–100.0)
MONOS PCT: 8 %
Monocytes Absolute: 0.6 10*3/uL (ref 0.1–1.0)
NEUTROS ABS: 4.6 10*3/uL (ref 1.7–7.7)
NEUTROS PCT: 60 %
Platelets: 243 10*3/uL (ref 150–400)
RBC: 4.61 MIL/uL (ref 3.87–5.11)
RDW: 12.9 % (ref 11.5–15.5)
WBC: 7.8 10*3/uL (ref 4.0–10.5)

## 2015-05-17 LAB — LACTIC ACID, PLASMA: LACTIC ACID, VENOUS: 1.4 mmol/L (ref 0.5–2.0)

## 2015-05-17 LAB — URINE MICROSCOPIC-ADD ON

## 2015-05-17 LAB — LIPASE, BLOOD: LIPASE: 20 U/L (ref 11–51)

## 2015-05-17 MED ORDER — FENTANYL CITRATE (PF) 100 MCG/2ML IJ SOLN
6.5000 ug | INTRAMUSCULAR | Status: DC | PRN
  Administered 2015-05-17: 6.5 ug via INTRAVENOUS
  Filled 2015-05-17: qty 2

## 2015-05-17 MED ORDER — SODIUM CHLORIDE 0.9 % IV SOLN
INTRAVENOUS | Status: DC
  Administered 2015-05-17: 18:00:00 via INTRAVENOUS

## 2015-05-17 MED ORDER — IOHEXOL 300 MG/ML  SOLN
100.0000 mL | Freq: Once | INTRAMUSCULAR | Status: AC | PRN
  Administered 2015-05-17: 100 mL via INTRAVENOUS

## 2015-05-17 MED ORDER — DIATRIZOATE MEGLUMINE & SODIUM 66-10 % PO SOLN
ORAL | Status: AC
  Filled 2015-05-17: qty 30

## 2015-05-17 NOTE — ED Notes (Signed)
Pain to left lower abdomen, rates pain 10/10.

## 2015-05-17 NOTE — Discharge Instructions (Signed)
°Emergency Department Resource Guide °1) Find a Doctor and Pay Out of Pocket °Although you won't have to find out who is covered by your insurance plan, it is a good idea to ask around and get recommendations. You will then need to call the office and see if the doctor you have chosen will accept you as a new patient and what types of options they offer for patients who are self-pay. Some doctors offer discounts or will set up payment plans for their patients who do not have insurance, but you will need to ask so you aren't surprised when you get to your appointment. ° °2) Contact Your Local Health Department °Not all health departments have doctors that can see patients for sick visits, but many do, so it is worth a call to see if yours does. If you don't know where your local health department is, you can check in your phone book. The CDC also has a tool to help you locate your state's health department, and many state websites also have listings of all of their local health departments. ° °3) Find a Walk-in Clinic °If your illness is not likely to be very severe or complicated, you may want to try a walk in clinic. These are popping up all over the country in pharmacies, drugstores, and shopping centers. They're usually staffed by nurse practitioners or physician assistants that have been trained to treat common illnesses and complaints. They're usually fairly quick and inexpensive. However, if you have serious medical issues or chronic medical problems, these are probably not your best option. ° °No Primary Care Doctor: °- Call [facility] at  832-8000 - they can help you locate a primary care doctor that  accepts your insurance, provides certain services, etc. °- Physician Referral Service- 1-800-533-3463 ° °Chronic Pain Problems: °Organization         Address  Phone   Notes  °[facility]  (336) [phone] Patients need to be referred by their primary care doctor.  ° °Medication  Assistance: °Organization         Address  Phone   Notes  °[place] [facility] [address] °[place], [place] [address] (336) 641-8030 --Must be a resident of [place] °-- Must have NO insurance coverage whatsoever (no Medicaid/ Medicare, etc.) °-- The pt. MUST have a primary care doctor that directs their care regularly and follows them in the community °  °[facility]  (866) 331-1348   °[employer]  (888) 892-1162   ° °Agencies that provide inexpensive medical care: °Organization         Address  Phone   Notes  °[facility]  (336) 832-8035   °[facility]    (336) 832-7272   °[facility] [address] °[place], [place] [address] (336) 832-4777   °[facility] [address], °[place] (336) 271-4999   °[facility]    (336) 373-0678   °[facility]    (336) 272-1050   °[facility] ° [address], [place] Phone:  (336) 832-4444, Fax:  (336) 832-4440 Hours of Operation:  9 am - 6 pm, M-F.  Also accepts Medicaid/Medicare and self-pay.  °[facility] ° [address], [place] Phone: (336) [phone], Fax: (336) [phone]. Hours of Operation:  8:30 am - 5:30 pm, M-F.  Also accepts Medicaid and self-pay.  °[facility] [place] [address], [place] Phone: (336) 878-6027   °[facility] [address], [place], [place] (336)723-1848, Ext. 123 Mondays & Thursdays: 7-9 AM.  First 15 patients are seen on a first come, first serve basis. °  ° °Medicaid-accepting [place] Providers: ° °Organization         Address  Phone   Notes  °[facility] [address], [place] (336) 641-2100 Also accepts self-pay patients.  °[facility] [address], [place] ° (336) [phone]   °[facility] [address], [place]  (336) 288-8857   °[facility] [address] [place] [address], [place] (336) 299-7000   °Veita Bland [address], [place]  ° (336) [phone] Only accepts  Access Medicaid patients after they have their name applied to their card.  ° °Self-Pay (no insurance) in [place]: ° °Organization         Address  Phone   Notes  °Sickle Cell Patients, [facility] [address], [place] (336) 832-1970   °[facility] [address], [place] (336) 832-4400   °[facility] ° [address], [place] (336) 992-4800   °[facility]/Dr. Osei-Bonsu ° [address] [place] [address], Trail or [address], [place] (336) [phone] Phone number for both [place] and [place] locations is the same.  °[facility] [address], [place] (336) 299-0000   °[facility] [address] [place] [address], [place] or [address] (336) 852-7530 °(336) 878-2260   °[facility] [address], [place] (336) 350-1642, phone; (336) 294-5005, fax Sees patients 1st and 3rd Saturday of every month.  Must not qualify for public or private insurance (i.e. Medicaid, Medicare, [place] Health Choice, Veterans' Benefits) • Household income should be no more than 200% of the poverty level •The clinic cannot treat you if you are pregnant or think you are pregnant • Sexually transmitted diseases are not treated at the clinic.  ° ° °Dental Care: °Organization         Address  Phone  Notes  °[place] [facility] [address], [place] (336) 641-6152 Accepts children up to age 21 who are enrolled in Medicaid or [place] Health Choice; pregnant women with a Medicaid card; and children who have applied for Medicaid or [place] Health Choice, but were declined, whose parents can pay a reduced fee at time of service.  °[place]  [facility] [place]  [address], [place] (336) 641-7733 Accepts children up to age 21 who are enrolled in Medicaid or [place] Health Choice; pregnant women with a Medicaid card; and children who have applied for Medicaid or [place] Health Choice, but were declined, whose parents can pay a reduced fee at time of service.  °[facility] ° [address],  (336) 641-4533 Patients are seen by appointment only. Walk-ins are not accepted. [facility] will see patients 18 years of age and older. °Monday - Tuesday (8am-5pm) °Most Wednesdays (8:30-5pm) °$30 per visit, cash only  °[facility] ° [address], [place] (336) 641-4533 Patients are seen by appointment only. Walk-ins are not accepted. [facility] will see patients 18 years of age and older. °One   Wednesday Evening (Monthly: Volunteer Based).  $30 per visit, cash only  °[facility]  (919) [phone] for adults; Children under age 4, call Graduate Pediatric Dentistry at ([phone]) [phone]. Children aged 4-14, please call ([phone]) [phone] to request a pediatric application. ° Dental services are provided in all areas of dental care including fillings, crowns and bridges, complete and partial dentures, implants, gum treatment, root canals, and extractions. Preventive care is also provided. Treatment is provided to both adults and children. °Patients are selected via a lottery and there is often a waiting list. °  °[facility] [address], °[place] ° (336) 763-8833 [email or web address] °  °[facility] [address], [place], [place] (336)723-1848, Ext. 123 Second and Fourth Thursday of each month, opens at 6:30 AM; Clinic ends at 9 AM.  Patients are seen on a first-come first-served basis, and a limited number are seen during each clinic.  ° °[facility] ° [address], [place], [place] (336) [phone]    Eligibility Requirements °You must have lived in [place], [place], or [place] counties for at least the last three months. °  You cannot be eligible for state or federal sponsored healthcare insurance, including Veterans Administration, Medicaid, or Medicare. °  You generally cannot be eligible for healthcare insurance through your employer.  °  How to apply: °Eligibility screenings are held every Tuesday and Wednesday afternoon from 1:00 pm until 4:00 pm. You do not need an appointment for the interview!  °[facility] [address], [place], [place] 336-631-2330   °[facility]  336-342-8273   °[facility]  336-703-3100   °[facility]  336-570-6415   ° °Behavioral Health Resources in the Community: °Intensive Outpatient Programs °Organization         Address  Phone  Notes  °[facility] [address], [place], [place] 336-878-6098   °[facility] [address], [place], [place] 336-832-9800   °[facility] [address], [place], [place] ° 336-882-2125   °[facility] [address],  °[place], [place] 1-800-853-5163 or 336-641-4981   °Substance Abuse Resources °Organization         Address  Phone  Notes  °[facility]  336-882-2125   °[facility]  336-784-9470   °[facility]  336-285-9073   °[facility]  336-845-3988   °Residential & Outpatient Substance Abuse Program  1-800-659-3381   °Psychological Services °Organization         Address  Phone  Notes  °[facility]  336- 832-9600   °[facility]  336- 378-7881   °[facility] [address], [place] 1-800-853-5163 or 336-641-4981   ° °Mobile Crisis Teams °Organization         Address  Phone  Notes  °[facility], Mobile Crisis Care Unit  1-877-626-1772   °Assertive °[facility] ° [address]  [place], [place] 336-834-9664   °Sharon DeEsch [address] °[place] 336-554-5454   ° °Self-Help/Support Groups °Organization         Address  Phone             Notes  °[facility] - variety of support groups  336- 373-1402 Call for more information  °Narcotics Anonymous (NA), [facility] [address], °[place]  2 meetings at this location  ° °  Residential Treatment Programs Organization         Address  Phone  Notes  [facility] [address],    [place]  1-(905)008-7671   [facility]  [address] [place] [id number], [place], [place]   [place], [place] (825) [phone] Admissions: 8am-3pm M-F  [facility] [place] [address]    [place], [place] [id number]   [facility] [address] [place], [place], [place]   [facility] [address],  [place], [place]   [facility] Intensive Outpatient [address] [place] [address], [place], [place]   [facility] ([place] [address]  [place], [place] 1-684-035-5544 or (419)873-1516   [facility] ([facility] [address], [place], [place] Accepts Medicaid  [facility] [address]  [place] 1-506-642-6301 Substance Abuse/Addiction Treatment   [facility]         Address  Phone  Notes  [facility]  202-047-1562   Domenic Schwab, PhD [address] [place], [place]   (949)301-3379 or [phone]   [facility] [place], [place] [phone]   [facility] [address] [place], [place] (256)[phone] Insurance/Medicaid/sponsorship through [facility] [address]                                    [place], [place] [phone] [place] [address], [place] [phone]    Dr. Adele Schilder  [phone]   [facility] Dept. 1) [address], [place] 2) [place] 3)  [address], [place] [phone]  [phone]   [place] [phone] or [phone] (After Hours)      Take your usual prescriptions as previously directed.  Apply moist heat or ice to the area(s) of discomfort, for 15 minutes at a time, several times per day for the next few days.  Do not fall asleep on a heating or ice pack.  Call your regular medical doctor on Tuesday to schedule a follow up appointment this week.  Return to the Emergency Department immediately if worsening.

## 2015-05-17 NOTE — ED Provider Notes (Signed)
CSN: [id number]     Arrival date & time 05/17/15  1640 History   First MD Initiated Contact with Patient 05/17/15 1707     Chief Complaint  Patient presents with  . Abdominal Pain      HPI  Pt was seen at 1710.  Per pt, c/o gradual onset and persistence of constant acute flair of her chronic, recurrent LLQ abd "pain" for the past 6 months, worse since yesterday.  Has been associated with no other symptoms.  Describes the abd pain as "sharp" and "aching."  Pain worsens with body position changes. Pt has been extensively evaluated by her PMD and GYN MD but she cannot recall the diagnosis. Denies N/V, no diarrhea, no fevers, no back pain, no rash, no CP/SOB, no black or blood in stools, no dysuria/hematuria.      Past Medical History  Diagnosis Date  . Hypertension   . Atrial fibrillation ([place])   . Cancer ([place])     left side  . GERD (gastroesophageal reflux disease)   . Hyperlipidemia   . Osteoporosis   . Osteopenia   . Edema   . Chronic back pain   . Abdominal pain, chronic, right lower quadrant     "ever since I had my appendix removed"  . Recurrent abdominal pain     LLQ  . Ovarian cyst 02/2015    left   Past Surgical History  Procedure Laterality Date  . Breast surgery    . Appendectomy    . Hernia repair    . Colon surgery     Family History  Problem Relation Age of Onset  . Heart disease    . Arthritis    . Cancer    . Diabetes Mother   . Hyperlipidemia Brother    Social History  Substance Use Topics  . Smoking status: Never Smoker   . Smokeless tobacco: Never Used  . Alcohol Use: No   OB History    Gravida Para Term Preterm AB TAB SAB Ectopic Multiple Living   2 1   1  1   1      Review of Systems ROS: Statement: All systems negative except as marked or noted in the HPI; Constitutional: Negative for fever and chills. ; ; Eyes: Negative for eye pain, redness and discharge. ; ; ENMT: Negative for ear pain, hoarseness, nasal congestion, sinus pressure and  sore throat. ; ; Cardiovascular: Negative for chest pain, palpitations, diaphoresis, dyspnea and peripheral edema. ; ; Respiratory: Negative for cough, wheezing and stridor. ; ; Gastrointestinal: +abd pain. Negative for nausea, vomiting, diarrhea, blood in stool, hematemesis, jaundice and rectal bleeding. . ; ; Genitourinary: Negative for dysuria, flank pain and hematuria. ; ; GYN:  No pelvic pain, no vaginal bleeding, no vaginal discharge, no vulvar pain. ;; Musculoskeletal: Negative for back pain and neck pain. Negative for swelling and trauma.; ; Skin: Negative for pruritus, rash, abrasions, blisters, bruising and skin lesion.; ; Neuro: Negative for headache, lightheadedness and neck stiffness. Negative for weakness, altered level of consciousness , altered mental status, extremity weakness, paresthesias, involuntary movement, seizure and syncope.      Allergies  Amoxicillin; Cephalosporins; Hydrocodone; Lipitor; Lorazepam; Lovenox; Morphine and related; Penicillins; and Sulfa antibiotics  Home Medications   Prior to Admission medications   Medication Sig Start Date End Date Taking? Authorizing Provider  ALPRAZolam (XANAX) 0.5 MG tablet TAKE (1/2) TO (1) TABLET TWICE DAILY AS NEEDED. 03/06/15   [name], MD  ciprofloxacin (CIPRO) 250 MG  tablet Take 1 tablet (250 mg total) by mouth 2 (two) times daily. 04/27/15   [name], MD  escitalopram (LEXAPRO) 10 MG tablet  03/16/15   Historical Provider, MD  metoprolol succinate (TOPROL-XL) 25 MG 24 hr tablet  04/13/15   Historical Provider, MD  metoprolol tartrate (LOPRESSOR) 25 MG tablet TAKE ONE HALF TABLET EVERY 6 HOURS PRN INCREASED HEART RATE 11/06/14   Mikey Kirschner, MD  Omega-3 Fatty Acids (OMEGA 3 PO) Take 1 capsule by mouth daily.    Historical Provider, MD  Rivaroxaban (XARELTO) 15 MG TABS tablet TAKE 1 TABLET ONCE DAILY WITH FOOD. 08/04/14   [name], MD  traMADol Veatrice Bourbon) 50 MG tablet  04/13/15   Historical Provider, MD   traMADol-acetaminophen (ULTRACET) 37.5-325 MG tablet Take 1 tablet by mouth every 6 (six) hours as needed. 04/04/15   Daleen Bo, MD  VITAMIN D, ERGOCALCIFEROL, PO Take 1 tablet by mouth daily.    Historical Provider, MD   BP 170/82 mmHg  Pulse 77  Temp(Src) 98.7 F (37.1 C) (Oral)  Resp 20  Ht 5\' 4"  (1.626 m)  Wt 155 lb (70.308 kg)  BMI 26.59 kg/m2  SpO2 96% Physical Exam  1715: Physical examination:  Nursing notes reviewed; Vital signs and O2 SAT reviewed;  Constitutional: Well developed, Well nourished, Well hydrated, In no acute distress; Head:  Normocephalic, atraumatic; Eyes: EOMI, PERRL, No scleral icterus; ENMT: Mouth and pharynx normal, Mucous membranes moist; Neck: Supple, Full range of motion, No lymphadenopathy; Cardiovascular: Regular rate and rhythm, No gallop; Respiratory: Breath sounds clear & equal bilaterally, No wheezes.  Speaking full sentences with ease, Normal respiratory effort/excursion; Chest: Nontender, Movement normal; Abdomen: Soft, +LLQ tender to palp. No rebound or guarding. Nondistended, Normal bowel sounds; Genitourinary: No CVA tenderness; Extremities: Pulses normal, No tenderness, No edema, No calf edema or asymmetry.; Neuro: AA&Ox3, Major CN grossly intact.  Speech clear. No gross focal motor or sensory deficits in extremities.; Skin: Color normal, Warm, Dry.   ED Course  Procedures (including critical care time) Labs Review   Imaging Review  I have personally reviewed and evaluated these images and lab results as part of my medical decision-making.   EKG Interpretation None      MDM  MDM Reviewed: previous chart, nursing note and vitals Reviewed previous: labs Interpretation: labs, CT scan and x-ray      Results for orders placed or performed during the hospital encounter of 05/17/15  Urinalysis, Routine w reflex microscopic  Result Value Ref Range   Color, Urine YELLOW YELLOW   APPearance HAZY (A) CLEAR   Specific Gravity, Urine  <1.005 (L) 1.005 - 1.030   pH 6.0 5.0 - 8.0   Glucose, UA NEGATIVE NEGATIVE mg/dL   Hgb urine dipstick TRACE (A) NEGATIVE   Bilirubin Urine NEGATIVE NEGATIVE   Ketones, ur NEGATIVE NEGATIVE mg/dL   Protein, ur NEGATIVE NEGATIVE mg/dL   Nitrite NEGATIVE NEGATIVE   Leukocytes, UA LARGE (A) NEGATIVE  Comprehensive metabolic panel  Result Value Ref Range   Sodium 139 135 - 145 mmol/L   Potassium 3.5 3.5 - 5.1 mmol/L   Chloride 103 101 - 111 mmol/L   CO2 27 22 - 32 mmol/L   Glucose, Bld 153 (H) 65 - 99 mg/dL   BUN 15 6 - 20 mg/dL   Creatinine, Ser 0.80 0.44 - 1.00 mg/dL   Calcium 8.9 8.9 - 10.3 mg/dL   Total Protein 7.1 6.5 - 8.1 g/dL   Albumin 3.9 3.5 -  5.0 g/dL   AST 20 15 - 41 U/L   ALT 15 14 - 54 U/L   Alkaline Phosphatase 57 38 - 126 U/L   Total Bilirubin 0.7 0.3 - 1.2 mg/dL   GFR calc non Af Amer >60 >60 mL/min   GFR calc Af Amer >60 >60 mL/min   Anion gap 9 5 - 15  Lipase, blood  Result Value Ref Range   Lipase 20 11 - 51 U/L  Lactic acid, plasma  Result Value Ref Range   Lactic Acid, Venous 1.4 0.5 - 2.0 mmol/L  CBC with Differential  Result Value Ref Range   WBC 7.8 4.0 - 10.5 K/uL   RBC 4.61 3.87 - 5.11 MIL/uL   Hemoglobin 13.3 12.0 - 15.0 g/dL   HCT 40.4 36.0 - 46.0 %   MCV 87.6 78.0 - 100.0 fL   MCH 28.9 26.0 - 34.0 pg   MCHC 32.9 30.0 - 36.0 g/dL   RDW 12.9 11.5 - 15.5 %   Platelets 243 150 - 400 K/uL   Neutrophils Relative % 60 %   Neutro Abs 4.6 1.7 - 7.7 K/uL   Lymphocytes Relative 31 %   Lymphs Abs 2.4 0.7 - 4.0 K/uL   Monocytes Relative 8 %   Monocytes Absolute 0.6 0.1 - 1.0 K/uL   Eosinophils Relative 1 %   Eosinophils Absolute 0.1 0.0 - 0.7 K/uL   Basophils Relative 0 %   Basophils Absolute 0.0 0.0 - 0.1 K/uL  Urine microscopic-add on  Result Value Ref Range   Squamous Epithelial / LPF 6-30 (A) NONE SEEN   WBC, UA 6-30 0 - 5 WBC/hpf   RBC / HPF 0-5 0 - 5 RBC/hpf   Bacteria, UA RARE (A) NONE SEEN   Dg Chest 2 View 05/17/2015  CLINICAL DATA:   Left abdominal pain EXAM: CHEST  2 VIEW COMPARISON:  11/01/2014 FINDINGS: Stable cardiomegaly with left basilar scarring/ atelectasis. Right lung clear. No CHF or pneumonia. No significant effusion or pneumothorax. Trachea midline. Atherosclerosis of the aorta. IMPRESSION: Cardiomegaly without CHF or pneumonia Chronic left basilar scarring No significant interval change. Electronically Signed   By: Jerilynn Mages.  Shick M.D.   On: 05/17/2015 19:05   Ct Abdomen Pelvis W Contrast 05/17/2015  CLINICAL DATA:  Abdominal pain. History of breast cancer and melanoma EXAM: CT ABDOMEN AND PELVIS WITH CONTRAST TECHNIQUE: Multidetector CT imaging of the abdomen and pelvis was performed using the standard protocol following bolus administration of intravenous contrast. CONTRAST:  180mL OMNIPAQUE IOHEXOL 300 MG/ML  SOLN COMPARISON:  CT abdomen pelvis 04/04/2015 FINDINGS: Lower chest: Lung bases clear. No infiltrate or effusion. Heart size within normal limits. Hepatobiliary: Normal liver size and contour. No liver lesion. Gallbladder and bile ducts normal. Pancreas: Negative Spleen: Negative Adrenals/Urinary Tract: Kidneys are normal without renal obstruction or mass. No renal tract calculi. Urinary bladder normal. Stomach/Bowel: Negative for bowel obstruction. Large duodenal diverticulum unchanged. Negative for diverticulitis. Sigmoid diverticulosis is present. Midline ventral hernia just above the pubic symphysis contains a small bowel loop which is not thickened or dilated. No change from the prior study. Vascular/Lymphatic: Atherosclerotic aorta without aneurysm. No lymphadenopathy. Reproductive: Normal uterus. Left adnexal cyst measures 2.7 x 4.3 cm and is unchanged from the prior study. Other: No free fluid. Musculoskeletal: Grade 1 anterior slip L4-5 unchanged. Moderate disc degeneration in the lumbar spine. No fracture or mass lesion. IMPRESSION: Ventral hernia containing small bowel above the pubic symphysis unchanged. No bowel  obstruction Sigmoid diverticulosis.  Negative for  diverticulitis. Left adnexal cyst is unchanged from the prior study. Follow-up pelvic ultrasound suggested in 3 months. Electronically Signed   By: Franchot Gallo M.D.   On: 05/17/2015 18:57    1910:  Udip appears contaminated; UC is pending. Pt denies dysuria. Workup with known left ovarian cyst (is benign per EPIC chart review and not felt to be the cause of her pain per PMD and GYN MD ofc notes). Pt has ambulated in the ED with steady gait, NAD. Pt has tol PO well without N/V. Tx symptomatically, f/u PMD. Dx and testing d/w pt.  Questions answered.  Verb understanding, agreeable to d/c home with outpt f/u.   Francine Graven, DO 05/21/15 2013

## 2015-05-19 ENCOUNTER — Emergency Department (HOSPITAL_COMMUNITY)
Admission: EM | Admit: 2015-05-19 | Discharge: 2015-05-19 | Disposition: A | Discharge status: Home / Self Care | Payer: Medicare Other | Attending: Emergency Medicine | Admitting: Emergency Medicine

## 2015-05-19 ENCOUNTER — Emergency Department (HOSPITAL_COMMUNITY): Payer: Medicare Other

## 2015-05-19 ENCOUNTER — Encounter (HOSPITAL_COMMUNITY): Payer: Self-pay | Admitting: Emergency Medicine

## 2015-05-19 DIAGNOSIS — Z7901 Long term (current) use of anticoagulants: Secondary | ICD-10-CM | POA: Diagnosis not present

## 2015-05-19 DIAGNOSIS — R1030 Lower abdominal pain, unspecified: Secondary | ICD-10-CM | POA: Insufficient documentation

## 2015-05-19 DIAGNOSIS — E785 Hyperlipidemia, unspecified: Secondary | ICD-10-CM | POA: Diagnosis not present

## 2015-05-19 DIAGNOSIS — G8929 Other chronic pain: Secondary | ICD-10-CM | POA: Insufficient documentation

## 2015-05-19 DIAGNOSIS — Z8742 Personal history of other diseases of the female genital tract: Secondary | ICD-10-CM | POA: Diagnosis not present

## 2015-05-19 DIAGNOSIS — Z79899 Other long term (current) drug therapy: Secondary | ICD-10-CM | POA: Insufficient documentation

## 2015-05-19 DIAGNOSIS — I4891 Unspecified atrial fibrillation: Secondary | ICD-10-CM | POA: Diagnosis not present

## 2015-05-19 DIAGNOSIS — Z8719 Personal history of other diseases of the digestive system: Secondary | ICD-10-CM | POA: Insufficient documentation

## 2015-05-19 DIAGNOSIS — I10 Essential (primary) hypertension: Secondary | ICD-10-CM | POA: Insufficient documentation

## 2015-05-19 DIAGNOSIS — M545 Low back pain: Secondary | ICD-10-CM | POA: Diagnosis not present

## 2015-05-19 DIAGNOSIS — Z880 Allergy status to penicillin: Secondary | ICD-10-CM | POA: Diagnosis not present

## 2015-05-19 DIAGNOSIS — M25552 Pain in left hip: Secondary | ICD-10-CM | POA: Diagnosis present

## 2015-05-19 DIAGNOSIS — Z859 Personal history of malignant neoplasm, unspecified: Secondary | ICD-10-CM | POA: Diagnosis not present

## 2015-05-19 LAB — URINE CULTURE

## 2015-05-19 LAB — URINALYSIS, ROUTINE W REFLEX MICROSCOPIC
Bilirubin Urine: NEGATIVE
Glucose, UA: NEGATIVE mg/dL
Hgb urine dipstick: NEGATIVE
KETONES UR: NEGATIVE mg/dL
LEUKOCYTES UA: NEGATIVE
NITRITE: NEGATIVE
PROTEIN: NEGATIVE mg/dL
Specific Gravity, Urine: <1.005 — ABNORMAL LOW (ref 1.005–1.030)
pH: 6 (ref 5.0–8.0)

## 2015-05-19 MED ORDER — ONDANSETRON 4 MG PO TBDP: 4.0000 mg | ORAL_TABLET | Freq: Three times a day (TID) | ORAL | Status: DC | PRN

## 2015-05-19 MED ORDER — DOCUSATE SODIUM 100 MG PO CAPS: 100.0000 mg | ORAL_CAPSULE | Freq: Two times a day (BID) | ORAL | Status: DC

## 2015-05-19 MED ORDER — HYDROCODONE-ACETAMINOPHEN 5-325 MG PO TABS: 1.0000 | ORAL_TABLET | Freq: Four times a day (QID) | ORAL | Status: DC | PRN

## 2015-05-19 MED ORDER — TRAMADOL HCL 50 MG PO TABS
50.0000 mg | ORAL_TABLET | Freq: Once | ORAL | Status: AC
  Administered 2015-05-19: 50 mg via ORAL
  Filled 2015-05-19: qty 1

## 2015-05-19 NOTE — Discharge Instructions (Signed)
Please do not take your tramadol and hydrocodone together. You may take Zofran with your hydrocodone if it makes you nauseous. All pain medicine can make you it. This is not an allergic reaction unless you have a rash. Please follow-up with your primary care physician for further workup. You have recently had labs, chest x-ray, CT of your abdomen and pelvis, urine and x-ray of your hip that were unremarkable. Please use your walker at all times.    Hip Pain Your hip is the joint between your upper legs and your lower pelvis. The bones, cartilage, tendons, and muscles of your hip joint perform a lot of work each day supporting your body weight and allowing you to move around. Hip pain can range from a minor ache to severe pain in one or both of your hips. Pain may be felt on the inside of the hip joint near the groin, or the outside near the buttocks and upper thigh. You may have swelling or stiffness as well.  HOME CARE INSTRUCTIONS   Take medicines only as directed by your health care provider.  Apply ice to the injured area:  Put ice in a plastic bag.  Place a towel between your skin and the bag.  Leave the ice on for 15-20 minutes at a time, 3-4 times a day.  Keep your leg raised (elevated) when possible to lessen swelling.  Avoid activities that cause pain.  Follow specific exercises as directed by your health care provider.  Sleep with a pillow between your legs on your most comfortable side.  Record how often you have hip pain, the location of the pain, and what it feels like. SEEK MEDICAL CARE IF:   You are unable to put weight on your leg.  Your hip is red or swollen or very tender to touch.  Your pain or swelling continues or worsens after 1 week.  You have increasing difficulty walking.  You have a fever. SEEK IMMEDIATE MEDICAL CARE IF:   You have fallen.  You have a sudden increase in pain and swelling in your hip. MAKE SURE YOU:   Understand these  instructions.  Will watch your condition.  Will get help right away if you are not doing well or get worse.   This information is not intended to replace advice given to you by your health care provider. Make sure you discuss any questions you have with your health care provider.   Document Released: 10/27/2009 Document Revised: 05/30/2014 Document Reviewed: 01/03/2013 Elsevier Interactive Patient Education 2016 Hall for Routine Care of Injuries Theroutine careofmanyinjuriesincludes rest, ice, compression, and elevation (RICE therapy). RICE therapy is often recommended for injuries to soft tissues, such as a muscle strain, ligament injuries, bruises, and overuse injuries. It can also be used for some bony injuries. Using RICE therapy can help to relieve pain, lessen swelling, and enable your body to heal. Rest Rest is required to allow your body to heal. This usually involves reducing your normal activities and avoiding use of the injured part of your body. Generally, you can return to your normal activities when you are comfortable and have been given permission by your health care provider. Ice Icing your injury helps to keep the swelling down, and it lessens pain. Do not apply ice directly to your skin.  Put ice in a plastic bag.  Place a towel between your skin and the bag.  Leave the ice on for 20 minutes, 2-3 times a day. Do  this for as long as you are directed by your health care provider. Compression Compression means putting pressure on the injured area. Compression helps to keep swelling down, gives support, and helps with discomfort. Compression may be done with an elastic bandage. If an elastic bandage has been applied, follow these general tips:  Remove and reapply the bandage every 3-4 hours or as directed by your health care provider.  Make sure the bandage is not wrapped too tightly, because this can cut off circulation. If part of your body beyond  the bandage becomes blue, numb, cold, swollen, or more painful, your bandage is most likely too tight. If this occurs, remove your bandage and reapply it more loosely.  See your health care provider if the bandage seems to be making your problems worse rather than better. Elevation Elevation means keeping the injured area raised. This helps to lessen swelling and decrease pain. If possible, your injured area should be elevated at or above the level of your heart or the center of your chest. [place]? You should seek medical care if:  Your pain and swelling continue.  Your symptoms are getting worse rather than improving. These symptoms may indicate that further evaluation or further X-rays are needed. Sometimes, X-rays may not show a small broken bone (fracture) until a number of days later. Make a follow-up appointment with your health care provider. WHEN SHOULD I SEEK IMMEDIATE MEDICAL CARE? You should seek immediate medical care if:  You have sudden severe pain at or below the area of your injury.  You have redness or increased swelling around your injury.  You have tingling or numbness at or below the area of your injury that does not improve after you remove the elastic bandage.   This information is not intended to replace advice given to you by your health care provider. Make sure you discuss any questions you have with your health care provider.   Document Released: 08/21/2000 Document Revised: 01/28/2015 Document Reviewed: 04/16/2014 Elsevier Interactive Patient Education [employer].

## 2015-05-19 NOTE — ED Notes (Signed)
Pt complaining of left hip pain that radiates to groin, pt denies injury. States she was seen here Sunday, but states no relief. Pt states urinary frequency, but denies burning, N/V/D

## 2015-05-19 NOTE — ED Notes (Signed)
Pt awaiting ride from family.

## 2015-05-19 NOTE — ED Provider Notes (Addendum)
TIME SEEN: 5:50 AM  CHIEF COMPLAINT: Abdominal pain, left hip pain  HPI: Pt is a 81 y.o. female with history of hypertension, atrial fibrillation on Xarelto, hyperlipidemia, multiple prior abdominal surgeries including colectomy, multiple hernia repairs, appendectomy who presents emergency department with left lower abdominal pain, left hip pain. Patient has had the symptoms for several weeks. Reports that pain has been increasing over the past several days. She feels that she is hurting in her left hip and goes into her left back and left lower abdomen. Pain is worse with walking and with movement of the right hip. Also hurts when you palpate her hip and abdomen. She denies any fevers, chills, nausea, vomiting or diarrhea. No dysuria or hematuria. No vaginal bleeding or discharge. She states that her PCP at one time treated her with Cipro for possible UTI and she felt that this helped her pain for approximately 2 weeks and then it returned. She was seen in the emergency department on December 25 for the same and had a negative chest x-ray, CT scan showed a ventral hernia, diverticulosis without diverticulitis and an unchanged left adnexal cyst. She had normal labs and a negative lactate at that time. Urine did show trace hemoglobin large leukocytes but was thought to be contaminated. Urine culture is pending. It does appear that she has been evaluated by her PCP for left lower abdominal pain and had a pelvic ultrasound on November 16 which showed complex lesions of the left adnexa. She was sent to see Dr. Everitt Amber with OB/GYN on 11/28. They did not feel that this was a neoplasm and they did not feel that this was the source of her pain. They also note that patient is a poor surgical candidate given her multiple medical comorbidities, that she is on Xarelto and she has also stated that she has "died during operations before". It was felt that her abdominal pain may be secondary to adhesions from prior  surgeries. She denies any numbness, tingling or focal weakness. No bowel or bladder incontinence. States she was told before that she had sciatica. Is not sure if this still similar. States she is taking tramadol at home for pain without relief. She uses a walker. She lives at home alone. She has a son who lives in this area.   ROS: See HPI Constitutional: no fever  Eyes: no drainage  ENT: no runny nose   Cardiovascular:  no chest pain  Resp: no SOB  GI: no vomiting GU: no dysuria Integumentary: no rash  Allergy: no hives  Musculoskeletal: no leg swelling  Neurological: no slurred speech ROS otherwise negative  PAST MEDICAL HISTORY/PAST SURGICAL HISTORY:  Past Medical History  Diagnosis Date  . Hypertension   . Atrial fibrillation ([place])   . Cancer ([place])     left side  . GERD (gastroesophageal reflux disease)   . Hyperlipidemia   . Osteoporosis   . Osteopenia   . Edema   . Chronic back pain   . Abdominal pain, chronic, right lower quadrant     "ever since I had my appendix removed"  . Recurrent abdominal pain     LLQ  . Ovarian cyst 02/2015    left    MEDICATIONS:  Prior to Admission medications   Medication Sig Start Date End Date Taking? Authorizing Provider  ALPRAZolam (XANAX) 0.5 MG tablet TAKE (1/2) TO (1) TABLET TWICE DAILY AS NEEDED. Patient not taking: Reported on 05/17/2015 03/06/15   [name], MD  ALPRAZolam Duanne Moron)  0.5 MG tablet Take 0.5-1 mg by mouth at bedtime as needed for anxiety.    Historical Provider, MD  ciprofloxacin (CIPRO) 250 MG tablet Take 1 tablet (250 mg total) by mouth 2 (two) times daily. Patient not taking: Reported on 05/17/2015 04/27/15   [name], MD  metoprolol tartrate (LOPRESSOR) 25 MG tablet TAKE ONE HALF TABLET EVERY 6 HOURS PRN INCREASED HEART RATE Patient taking differently: Take 25 mg by mouth daily. TAKE ONE HALF TABLET EVERY 6 HOURS PRN INCREASED HEART RATE 11/06/14   Mikey Kirschner, MD  Omega-3 Fatty Acids (OMEGA 3  PO) Take 1 capsule by mouth daily.    Historical Provider, MD  Rivaroxaban (XARELTO) 15 MG TABS tablet TAKE 1 TABLET ONCE DAILY WITH FOOD. 08/04/14   [name], MD  traMADol (ULTRAM) 50 MG tablet Take 50 mg by mouth daily as needed for moderate pain.  04/13/15   Historical Provider, MD  VITAMIN D, ERGOCALCIFEROL, PO Take 1 tablet by mouth daily.    Historical Provider, MD    ALLERGIES:  Allergies  Allergen Reactions  . Amoxicillin Hives  . Cephalosporins Itching  . Hydrocodone Itching and Nausea Only    In large doses Made her feel like she was in somebody else's house when getting up at night after taking it  . Lipitor [Atorvastatin] Other (See Comments)    unknown  . Lorazepam Other (See Comments)    Exceptional fatigue   . Lovenox [Enoxaparin Sodium] Nausea And Vomiting  . Morphine And Related Itching  . Penicillins Swelling    Has patient had a PCN reaction causing immediate rash, facial/tongue/throat swelling, SOB or lightheadedness with hypotension: unknown Has patient had a PCN reaction causing severe rash involving mucus membranes or skin necrosis: unknown Has patient had a PCN reaction that required hospitalization No Has patient had a PCN reaction occurring within the last 10 years: no If all of the above answers are "NO", then may proceed with Cephalosporin use.   . Sulfa Antibiotics Swelling    SOCIAL HISTORY:  Social History  Substance Use Topics  . Smoking status: Never Smoker   . Smokeless tobacco: Never Used  . Alcohol Use: No    FAMILY HISTORY: Family History  Problem Relation Age of Onset  . Heart disease    . Arthritis    . Cancer    . Diabetes Mother   . Hyperlipidemia Brother     EXAM: BP 176/100 mmHg  Pulse 84  Temp(Src) 98.3 F (36.8 C) (Oral)  Resp 20  Ht 5\' 4"  (1.626 m)  Wt 155 lb (70.308 kg)  BMI 26.59 kg/m2  SpO2 98% CONSTITUTIONAL: Alert and oriented and responds appropriately to questions. Elderly, pleasant, afebrile and  nontoxic HEAD: Normocephalic EYES: Conjunctivae clear, PERRL ENT: normal nose; no rhinorrhea; moist mucous membranes; pharynx without lesions noted NECK: Supple, no meningismus, no LAD  CARD: RRR; S1 and S2 appreciated; no murmurs, no clicks, no rubs, no gallops RESP: Normal chest excursion without splinting or tachypnea; breath sounds clear and equal bilaterally; no wheezes, no rhonchi, no rales, no hypoxia or respiratory distress, speaking full sentences ABD/GI: Normal bowel sounds; non-distended; soft, tender to palpation over the left lower quadrant, no rebound, no guarding, no peritoneal signs, no tympany or fluid wave BACK:  The back appears normal and is non-tender to palpation, there is no CVA tenderness EXT: Tender to palpation over the left anterior and lateral hip but full range of motion in this joint and no leg  length discrepancy. Patient has pain when she tries to lift the right hip and states that it causes her to have pain in the left side. No joint effusion on exam, no erythema or warmth. 2+ DP pulses bilaterally. Normal ROM in all joints; otherwise extremities are non-tender to palpation; no edema; normal capillary refill; no cyanosis, no calf tenderness or swelling    SKIN: Normal color for age and race; warm NEURO: Moves all extremities equally, sensation to light touch intact diffusely, cranial nerves II through XII intact PSYCH: The patient's mood and manner are appropriate. Grooming and personal hygiene are appropriate.  MEDICAL DECISION MAKING: Patient here with chronic abdominal pain and hip pain. She recently underwent a large workup in the emergency department 2 days ago and had normal labs, lactate. CT of her abdomen and pelvis showed a chronic ventral hernia with no incarceration or bowel obstruction, diverticulosis without diverticulitis and a known stable left adnexal cyst. She has been followed by Dr. Wolfgang Phoenix her PCP as well as Dr. Denman George with OB/GYN. Pain today seems to  be musculoskeletal and worse when she is walking and palpated over the left hip. She is convinced that this is a urinary tract infection but is not having any urinary symptoms. She had a urine 2 days ago but it appeared to be a dirty sample. We will obtain a catheterized urine specimen and an x-ray of her hip. We'll give her a dose of tramadol in the emergency department.  ED PROGRESS: Patient's urine shows no sign of infection. There is no blood, nitrites, leukocytes after catheterization. Discussed with patient I do not think this is a UTI causing her symptoms and I do not feel she needs to be on antibiotics. X-ray of the hip shows no fracture or dislocation. Discussed with patient that this may be sciatica versus muscle strain versus arthritis. She states that tramadol is not helping her pain. I will discharge her with prescription for Vicodin but have advised her not to take her tramadol at the same time. She has been able to ambulate although slowly in the emergency department using a walker. States she does have a walker at home. I have recommend close follow-up with her primary care physician Dr. Wolfgang Phoenix.  She states she will call today for an appointment. I feel she may benefit from physical therapy as an outpatient. I do not see any reason for admission or further emergent workup at this time. Discussed return precautions. She verbalized understanding and is comfortable with this plan.     [place], DO 05/19/15 0725   I've also sent a note to her primary care physician Dr. Sallee Lange for close follow up.  [place], DO 05/19/15 [phone]

## 2015-05-20 ENCOUNTER — Ambulatory Visit: Payer: Medicare Other | Admitting: Cardiology

## 2015-05-20 ENCOUNTER — Encounter: Payer: Self-pay | Admitting: Cardiology

## 2015-05-20 ENCOUNTER — Ambulatory Visit (INDEPENDENT_AMBULATORY_CARE_PROVIDER_SITE_OTHER): Payer: Medicare Other | Admitting: Cardiology

## 2015-05-20 VITALS — BP 172/82 | HR 70 | Ht 64.0 in | Wt 155.0 lb

## 2015-05-20 DIAGNOSIS — I10 Essential (primary) hypertension: Secondary | ICD-10-CM

## 2015-05-20 DIAGNOSIS — I4891 Unspecified atrial fibrillation: Secondary | ICD-10-CM

## 2015-05-20 MED ORDER — METOPROLOL SUCCINATE ER 50 MG PO TB24: 50.0000 mg | ORAL_TABLET | Freq: Every day | ORAL | Status: DC

## 2015-05-20 NOTE — Progress Notes (Signed)
Patient ID: [name], female   DOB: 13-Apr-1924, 81 y.o.   MRN: [id number]     Clinical Summary [name] is a 81 y.o.female seen today for follow up of the following medical problems.   1. Afib - she is on Toprol XL with prn lopressor for rate control - stroke prevention with xarelto  - notes some occasional palpitations, resolves with prn lopressor.  - no bleeding troubles on xarelto, she is on 15mg  daily .Looks like in the past she had some renal insufficiency that has improved and thus she was placed on 15mg  daily at that time.   2. Anxiety - followed by Dr Wolfgang Phoenix  3. HTN - compliant with meds  4. Chronic abdominal pain - followed by pcp, 2 recent ER visits with unremarkable workup.  Past Medical History  Diagnosis Date  . Hypertension   . Atrial fibrillation ([place])   . Cancer ([place])     left side  . GERD (gastroesophageal reflux disease)   . Hyperlipidemia   . Osteoporosis   . Osteopenia   . Edema   . Chronic back pain   . Abdominal pain, chronic, right lower quadrant     "ever since I had my appendix removed"  . Recurrent abdominal pain     LLQ  . Ovarian cyst 02/2015    left     Allergies  Allergen Reactions  . Amoxicillin Hives  . Cephalosporins Itching  . Hydrocodone Itching and Nausea Only    In large doses Made her feel like she was in somebody else's house when getting up at night after taking it  . Lipitor [Atorvastatin] Other (See Comments)    unknown  . Lorazepam Other (See Comments)    Exceptional fatigue   . Lovenox [Enoxaparin Sodium] Nausea And Vomiting  . Morphine And Related Itching  . Penicillins Swelling    Has patient had a PCN reaction causing immediate rash, facial/tongue/throat swelling, SOB or lightheadedness with hypotension: unknown Has patient had a PCN reaction causing severe rash involving mucus membranes or skin necrosis: unknown Has patient had a PCN reaction that required hospitalization No Has patient had a PCN  reaction occurring within the last 10 years: no If all of the above answers are "NO", then may proceed with Cephalosporin use.   . Sulfa Antibiotics Swelling     Current Outpatient Prescriptions  Medication Sig Dispense Refill  . ALPRAZolam (XANAX) 0.5 MG tablet Take 0.5-1 mg by mouth at bedtime as needed for anxiety.    . docusate sodium (COLACE) 100 MG capsule Take 1 capsule (100 mg total) by mouth every 12 (twelve) hours. 60 capsule 0  . HYDROcodone-acetaminophen (NORCO/VICODIN) 5-325 MG tablet Take 1-2 tablets by mouth every 6 (six) hours as needed. 20 tablet 0  . metoprolol tartrate (LOPRESSOR) 25 MG tablet TAKE ONE HALF TABLET EVERY 6 HOURS PRN INCREASED HEART RATE (Patient taking differently: Take 25 mg by mouth daily. TAKE ONE HALF TABLET EVERY 6 HOURS PRN INCREASED HEART RATE) 30 tablet 2  . Omega-3 Fatty Acids (OMEGA 3 PO) Take 1 capsule by mouth daily.    . ondansetron (ZOFRAN ODT) 4 MG disintegrating tablet Take 1 tablet (4 mg total) by mouth every 8 (eight) hours as needed for nausea or vomiting. 20 tablet 0  . Rivaroxaban (XARELTO) 15 MG TABS tablet TAKE 1 TABLET ONCE DAILY WITH FOOD. 30 tablet 5  . traMADol (ULTRAM) 50 MG tablet Take 50 mg by mouth daily as needed for moderate pain.  3  . VITAMIN D, ERGOCALCIFEROL, PO Take 1 tablet by mouth daily.     No current facility-administered medications for this visit.     Past Surgical History  Procedure Laterality Date  . Breast surgery    . Appendectomy    . Hernia repair    . Colon surgery       Allergies  Allergen Reactions  . Amoxicillin Hives  . Cephalosporins Itching  . Hydrocodone Itching and Nausea Only    In large doses Made her feel like she was in somebody else's house when getting up at night after taking it  . Lipitor [Atorvastatin] Other (See Comments)    unknown  . Lorazepam Other (See Comments)    Exceptional fatigue   . Lovenox [Enoxaparin Sodium] Nausea And Vomiting  . Morphine And Related  Itching  . Penicillins Swelling    Has patient had a PCN reaction causing immediate rash, facial/tongue/throat swelling, SOB or lightheadedness with hypotension: unknown Has patient had a PCN reaction causing severe rash involving mucus membranes or skin necrosis: unknown Has patient had a PCN reaction that required hospitalization No Has patient had a PCN reaction occurring within the last 10 years: no If all of the above answers are "NO", then may proceed with Cephalosporin use.   . Sulfa Antibiotics Swelling      Family History  Problem Relation Age of Onset  . Heart disease    . Arthritis    . Cancer    . Diabetes Mother   . Hyperlipidemia Brother      Social History [name] reports that she has never smoked. She has never used smokeless tobacco. [name] reports that she does not drink alcohol.   Review of Systems CONSTITUTIONAL: No weight loss, fever, chills, weakness or fatigue.  HEENT: Eyes: No visual loss, blurred vision, double vision or yellow sclerae.No hearing loss, sneezing, congestion, runny nose or sore throat.  SKIN: No rash or itching.  CARDIOVASCULAR: per HPI RESPIRATORY: No shortness of breath, cough or sputum.  GASTROINTESTINAL: + abdominal pain GENITOURINARY: No burning on urination, no polyuria NEUROLOGICAL: No headache, dizziness, syncope, paralysis, ataxia, numbness or tingling in the extremities. No change in bowel or bladder control.  MUSCULOSKELETAL: No muscle, back pain, joint pain or stiffness.  LYMPHATICS: No enlarged nodes. No history of splenectomy.  PSYCHIATRIC: No history of depression or anxiety.  ENDOCRINOLOGIC: No reports of sweating, cold or heat intolerance. No polyuria or polydipsia.  Marland Kitchen   Physical Examination Filed Vitals:   05/20/15 1422  BP: 172/82  Pulse: 70   Filed Vitals:   05/20/15 1422  Height: 5\' 4"  (1.626 m)  Weight: 155 lb (70.308 kg)    Gen: resting comfortably, no acute distress HEENT: no scleral  icterus, pupils equal round and reactive, no palptable cervical adenopathy,  CV: RRR, no m/r/g, no jvd Resp: Clear to auscultation bilaterally GI: abdomen is soft, non-tender, non-distended, normal bowel sounds, no hepatosplenomegaly MSK: extremities are warm, no edema.  Skin: warm, no rash Neuro:  no focal deficits Psych: appropriate affect   Diagnostic Studies 01/2014 MPI IMPRESSION: 1. No reversible ischemia or infarction.  2. Normal left ventricular wall motion.  3. Left ventricular ejection fraction 43%, borderline/mildly decreased function by nuclear standards. Recommend correlate with echo.  4. Intermediaterisk stress test findings based on mildly decreased LV systolic function, there is no myocardium at jeopardy*.  12/2013 Echo Study Conclusions  - Left ventricle: The cavity size was normal. Wall thickness was increased in a  pattern of mild LVH. Systolic function was normal. The estimated ejection fraction was in the range of 60% to 65%. Wall motion was normal; there were no regional wall motion abnormalities. - Aortic valve: Valve area (VTI): 1.92 cm^2. Valve area (Vmax): 1.95 cm^2. Valve area (Vmean): 2.07 cm^2. - Mitral valve: There was mild regurgitation. - Left atrium: The atrium was moderately dilated. - Right atrium: The atrium was mildly dilated. - Technically adequate study.    Assessment and Plan  1. Afib - occasoinal palpitatons, we will increase her Toprol XL to 50mg  daily - CHADS2Vasc score is 4, continue xarelto  2. HTN - above goal, increasing ToproL XL as described above.   F/u 2 months      Arnoldo Lenis, M.D.

## 2015-05-20 NOTE — Patient Instructions (Signed)
Medication Instructions:   INCREASE TOPROL XL to 50 MG DAILY   Labwork: NONE  Testing/Procedures: NONE  Follow-Up: Your physician recommends that you schedule a follow-up appointment in: 2 MONTHS WITH DR. BRANCH    Any Other Special Instructions Will Be Listed Below (If Applicable).     If you need a refill on your cardiac medications before your next appointment, please call your pharmacy.

## 2015-05-21 ENCOUNTER — Encounter: Payer: Self-pay | Admitting: Family Medicine

## 2015-05-21 ENCOUNTER — Ambulatory Visit (INDEPENDENT_AMBULATORY_CARE_PROVIDER_SITE_OTHER): Payer: Medicare Other | Admitting: Family Medicine

## 2015-05-21 VITALS — BP 122/80 | Ht 64.0 in | Wt 161.0 lb

## 2015-05-21 DIAGNOSIS — K439 Ventral hernia without obstruction or gangrene: Secondary | ICD-10-CM | POA: Diagnosis not present

## 2015-05-21 NOTE — Progress Notes (Addendum)
   Subjective:    Patient ID: [name], female    DOB: 20-Jun-1923, 81 y.o.   MRN: [id number]  HPI  Face-to-face evaluation after recent ER visits. Also face-to-face evaluation for home health  Patient arrives for 2 recent ER visits(12/25 and 12/27) for abdominal pain and sciatic nerve pain. Patient states nothing helps her pain.    patient was seen in the ER 2 separate visits. The results of all the testing was looked at and reviewed with the patient. In addition to this she states she's having severe lower abdominal pain worsens when she tries to lay back it also worsened when she tries to stand straight up sometimes pain radiates into her back sometimes into the hip region she is had CAT scan and lab work. CAT scan does show entrapment of a loop of bowel but no blockage. In addition to this she does have some spondylosis in the back but no severe issues as best can be tell by CT of the abdomen. Lab work overall reassuring. No vomiting no diarrhea.  Review of Systems     Denies chest tightness pressure pain shortness of breath relates sharp abdominal pain in the lower abdomen when she tries to straighten up or when she tries to lay back. Objective:   Physical Exam  lungs are clear no crackles respiratory rate is normal heart rate is controlled. Abdomen moderate tenderness in the lower abdomen especially when she tries to stand straight up. Negative low back pain or flank pain. Negative straight leg raise.   25 minutes was spent with the patient. Greater than half the time was spent in discussion and answering questions and counseling regarding the issues that the patient came in for today.     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 I feel she would best be served by seeing general  surgery for further evaluation of this area and possible surgery for this. This patient has been back and forth to the emergency department and office multiple times without improvement and in fact seems to be getting worse. If this was purely musculoskeletal I would've expected this to get better with time we will work on setting up referral in notifying the patient  I believe this patient would benefit from physical therapy. Because of her age and severe disability related to this pain she is unable to drive herself around she is having to use a walker to move around I believe this patient would benefit from a home health assessment as well as home health physical therapy. The patient is somewhat contrary in the regards that she often does not want to do physical therapy because she feels she hurts too much. I tried to convince her that physical therapy may in fact help her

## 2015-05-27 ENCOUNTER — Telehealth: Payer: Self-pay | Admitting: Family Medicine

## 2015-05-27 ENCOUNTER — Ambulatory Visit (HOSPITAL_COMMUNITY): Payer: Medicare Other | Admitting: Physical Therapy

## 2015-05-27 DIAGNOSIS — R52 Pain, unspecified: Secondary | ICD-10-CM

## 2015-05-27 DIAGNOSIS — Z7409 Other reduced mobility: Secondary | ICD-10-CM

## 2015-05-27 NOTE — Telephone Encounter (Signed)
Left message for medical records department to return my call. (Need recent office notes faxed to Dr. Nicki Reaper)

## 2015-05-27 NOTE — Telephone Encounter (Signed)
Patient states that Dr. Redmond Pulling told her that he doesn't know why she was sent to him but he could not help her with her condition. She states that Dr. Redmond Pulling said that he received nothing from our office stating what he was evaluating her for. She states that she is very frustrated with the way doctors have been treating her. She feels like all her doctors don't care about her because she is old. She doesn't want to go back to Dr. Redmond Pulling. She wants to know if there is anyway she can have injections done in her side to help with the discomfort.

## 2015-05-27 NOTE — Telephone Encounter (Signed)
Unfortunately I have nothing. This doctor is on a different electronic records systems to therefore I have not received anything. Please talk with the patient find out what's going on also call [facility] surgery see if they can fax [place] the doctor's note. I am unable to help right at the moment without further information please assist patient thank you

## 2015-05-27 NOTE — Telephone Encounter (Signed)
Let me speak with [facility]

## 2015-05-27 NOTE — Telephone Encounter (Signed)
Patient would like to know if we have spoken with Dr. Redmond Pulling yet regarding patients recent visit with him. She said he was supposed to send [place] some information over, but she hasn't heard anything yet.  She said she is hurting bad.

## 2015-05-28 ENCOUNTER — Ambulatory Visit: Payer: Medicare Other | Admitting: Family Medicine

## 2015-05-28 ENCOUNTER — Other Ambulatory Visit: Payer: Self-pay | Admitting: Family Medicine

## 2015-05-28 NOTE — Telephone Encounter (Signed)
Patient states that she doesn't want to go to physical therapy on [address] because she hurts to bad and is unable to walk and get around. Patient refused to see Dr. Ace Gins because she states that Dr. Ace Gins almost killed her son. Offered for patient to see another doctor in [place] or home health referral (but they will want to do physical therapy in the home) per Dr. Nicki Reaper. Patient agreed to have home health come to her home to evaluate and treat. Referral initiated in the system.

## 2015-05-28 NOTE — Telephone Encounter (Signed)
#  1-please call patient to notify her to let her know that we are trying to get the records from what Dr. Redmond Pulling had to say #2 I recommend physical therapy on [address] for possible abdominal wall muscle strain #3 I recommend consultation with Dr. Ace Gins in [place] regarding her lower groin pain. Possibly they can do an injection to help this. #4 I would recommend a follow-up office visit within the next 7-14 days #5 if we have not received documentation from the surgeon by Friday please call them back in as some to send.

## 2015-05-28 NOTE — Telephone Encounter (Signed)
Face-to-face evaluation was done on 05/21/2015, please send a copy of this evaluation to the home health company doing her consultation for home health consultation as well as physical therapy possible evaluate and treat

## 2015-05-29 NOTE — Telephone Encounter (Signed)
Please call Dr. Dois Davenport office back next week. Never received office notes from patient's last visit with them.

## 2015-06-02 NOTE — Telephone Encounter (Signed)
Awaiting fax from [place] office of last office note. Please put notes in box for Dr.Scott Luking to review.

## 2015-06-02 NOTE — Telephone Encounter (Signed)
Put in Dr Lars Mage chair for his review

## 2015-06-11 NOTE — Telephone Encounter (Signed)
[name] will be seeing patient for RN assessment for home health needs as well as home PT

## 2015-06-12 ENCOUNTER — Telehealth: Payer: Self-pay | Admitting: Family Medicine

## 2015-06-12 NOTE — Telephone Encounter (Signed)
[facility] called to let us know that they are working with [name] on physical therapy and home health.

## 2015-06-19 DIAGNOSIS — R109 Unspecified abdominal pain: Secondary | ICD-10-CM | POA: Diagnosis not present

## 2015-06-19 DIAGNOSIS — M5431 Sciatica, right side: Secondary | ICD-10-CM | POA: Diagnosis not present

## 2015-06-19 DIAGNOSIS — R531 Weakness: Secondary | ICD-10-CM | POA: Diagnosis not present

## 2015-06-19 DIAGNOSIS — K439 Ventral hernia without obstruction or gangrene: Secondary | ICD-10-CM | POA: Diagnosis not present

## 2015-06-23 ENCOUNTER — Other Ambulatory Visit: Payer: Self-pay | Admitting: Family Medicine

## 2015-06-23 NOTE — Telephone Encounter (Signed)
May have this and 4 refills 

## 2015-07-22 ENCOUNTER — Ambulatory Visit (INDEPENDENT_AMBULATORY_CARE_PROVIDER_SITE_OTHER): Payer: Medicare Other | Admitting: Cardiology

## 2015-07-22 ENCOUNTER — Encounter: Payer: Self-pay | Admitting: Cardiology

## 2015-07-22 VITALS — BP 140/80 | HR 86 | Ht 64.0 in | Wt 162.0 lb

## 2015-07-22 DIAGNOSIS — I10 Essential (primary) hypertension: Secondary | ICD-10-CM

## 2015-07-22 DIAGNOSIS — R002 Palpitations: Secondary | ICD-10-CM

## 2015-07-22 DIAGNOSIS — I4891 Unspecified atrial fibrillation: Secondary | ICD-10-CM | POA: Diagnosis not present

## 2015-07-22 MED ORDER — DILTIAZEM HCL ER COATED BEADS 180 MG PO CP24: 180.0000 mg | ORAL_CAPSULE | Freq: Every day | ORAL | Status: DC

## 2015-07-22 NOTE — Progress Notes (Signed)
Patient ID: [name], female   DOB: 04-16-1924, 82 y.o.   MRN: [id number]     Clinical Summary [name] is a 82 y.o.female seen today for follow up of the following medical problems.   1. Afib - metoprolol makes her tired, worst with dose increase. Did not improve palpitations  2. Anxiety - followed by Dr Wolfgang Phoenix  3. HTN - compliant with meds   Past Medical History  Diagnosis Date  . Hypertension   . Atrial fibrillation ([place])   . Cancer ([place])     left side  . GERD (gastroesophageal reflux disease)   . Hyperlipidemia   . Osteoporosis   . Osteopenia   . Edema   . Chronic back pain   . Abdominal pain, chronic, right lower quadrant     "ever since I had my appendix removed"  . Recurrent abdominal pain     LLQ  . Ovarian cyst 02/2015    left     Allergies  Allergen Reactions  . Amoxicillin Hives  . Cephalosporins Itching  . Hydrocodone Itching and Nausea Only    In large doses Made her feel like she was in somebody else's house when getting up at night after taking it  . Lipitor [Atorvastatin] Other (See Comments)    unknown  . Lorazepam Other (See Comments)    Exceptional fatigue   . Lovenox [Enoxaparin Sodium] Nausea And Vomiting  . Morphine And Related Itching  . Penicillins Swelling    Has patient had a PCN reaction causing immediate rash, facial/tongue/throat swelling, SOB or lightheadedness with hypotension: unknown Has patient had a PCN reaction causing severe rash involving mucus membranes or skin necrosis: unknown Has patient had a PCN reaction that required hospitalization No Has patient had a PCN reaction occurring within the last 10 years: no If all of the above answers are "NO", then may proceed with Cephalosporin use.   . Sulfa Antibiotics Swelling     Current Outpatient Prescriptions  Medication Sig Dispense Refill  . ALPRAZolam (XANAX) 0.5 MG tablet Take 0.5-1 mg by mouth at bedtime as needed for anxiety.    . docusate sodium  (COLACE) 100 MG capsule Take 1 capsule (100 mg total) by mouth every 12 (twelve) hours. 60 capsule 0  . HYDROcodone-acetaminophen (NORCO/VICODIN) 5-325 MG tablet Take 1-2 tablets by mouth every 6 (six) hours as needed. 20 tablet 0  . metoprolol succinate (TOPROL-XL) 50 MG 24 hr tablet Take 1 tablet (50 mg total) by mouth daily. Take with or immediately following a meal. 90 tablet 3  . metoprolol tartrate (LOPRESSOR) 25 MG tablet TAKE ONE HALF TABLET EVERY 6 HOURS PRN INCREASED HEART RATE (Patient taking differently: Take 25 mg by mouth daily. TAKE ONE HALF TABLET EVERY 6 HOURS PRN INCREASED HEART RATE) 30 tablet 2  . Omega-3 Fatty Acids (OMEGA 3 PO) Take 1 capsule by mouth daily.    . ondansetron (ZOFRAN ODT) 4 MG disintegrating tablet Take 1 tablet (4 mg total) by mouth every 8 (eight) hours as needed for nausea or vomiting. 20 tablet 0  . traMADol (ULTRAM) 50 MG tablet TAKE (1) TABLET BY MOUTH EVERY (6) HOURS AS NEEDED. 20 tablet 4  . VITAMIN D, ERGOCALCIFEROL, PO Take 1 tablet by mouth daily.    Alveda Reasons 15 MG TABS tablet TAKE 1 TABLET WITH FOOD ONCE DAILY 30 tablet 5   No current facility-administered medications for this visit.     Past Surgical History  Procedure Laterality Date  . Breast  surgery    . Appendectomy    . Hernia repair    . Colon surgery       Allergies  Allergen Reactions  . Amoxicillin Hives  . Cephalosporins Itching  . Hydrocodone Itching and Nausea Only    In large doses Made her feel like she was in somebody else's house when getting up at night after taking it  . Lipitor [Atorvastatin] Other (See Comments)    unknown  . Lorazepam Other (See Comments)    Exceptional fatigue   . Lovenox [Enoxaparin Sodium] Nausea And Vomiting  . Morphine And Related Itching  . Penicillins Swelling    Has patient had a PCN reaction causing immediate rash, facial/tongue/throat swelling, SOB or lightheadedness with hypotension: unknown Has patient had a PCN reaction  causing severe rash involving mucus membranes or skin necrosis: unknown Has patient had a PCN reaction that required hospitalization No Has patient had a PCN reaction occurring within the last 10 years: no If all of the above answers are "NO", then may proceed with Cephalosporin use.   . Sulfa Antibiotics Swelling      Family History  Problem Relation Age of Onset  . Heart disease    . Arthritis    . Cancer    . Diabetes Mother   . Hyperlipidemia Brother      Social History [name] reports that she has never smoked. She has never used smokeless tobacco. [name] reports that she does not drink alcohol.   Review of Systems CONSTITUTIONAL: No weight loss, fever, chills, weakness or fatigue.  HEENT: Eyes: No visual loss, blurred vision, double vision or yellow sclerae.No hearing loss, sneezing, congestion, runny nose or sore throat.  SKIN: No rash or itching.  CARDIOVASCULAR: per HPI RESPIRATORY: No shortness of breath, cough or sputum.  GASTROINTESTINAL: No anorexia, nausea, vomiting or diarrhea. No abdominal pain or blood.  GENITOURINARY: No burning on urination, no polyuria NEUROLOGICAL: No headache, dizziness, syncope, paralysis, ataxia, numbness or tingling in the extremities. No change in bowel or bladder control.  MUSCULOSKELETAL: No muscle, back pain, joint pain or stiffness.  LYMPHATICS: No enlarged nodes. No history of splenectomy.  PSYCHIATRIC: + anxiety ENDOCRINOLOGIC: No reports of sweating, cold or heat intolerance. No polyuria or polydipsia.  Marland Kitchen   Physical Examination Filed Vitals:   07/22/15 1503  BP: 140/80  Pulse: 86   Filed Vitals:   07/22/15 1503  Height: 5\' 4"  (1.626 m)  Weight: 162 lb (73.483 kg)    Gen: resting comfortably, no acute distress HEENT: no scleral icterus, pupils equal round and reactive, no palptable cervical adenopathy,  CV: RRR, no m/r/g, no jvd Resp: Clear to auscultation bilaterally GI: abdomen is soft, non-tender,  non-distended, normal bowel sounds, no hepatosplenomegaly MSK: extremities are warm, no edema.  Skin: warm, no rash Neuro:  no focal deficits Psych: appropriate affect   Diagnostic Studies 01/2014 MPI IMPRESSION: 1. No reversible ischemia or infarction.  2. Normal left ventricular wall motion.  3. Left ventricular ejection fraction 43%, borderline/mildly decreased function by nuclear standards. Recommend correlate with echo.  4. Intermediaterisk stress test findings based on mildly decreased LV systolic function, there is no myocardium at jeopardy*.  12/2013 Echo Study Conclusions  - Left ventricle: The cavity size was normal. Wall thickness was increased in a pattern of mild LVH. Systolic function was normal. The estimated ejection fraction was in the range of 60% to 65%. Wall motion was normal; there were no regional wall motion abnormalities. - Aortic valve:  Valve area (VTI): 1.92 cm^2. Valve area (Vmax): 1.95 cm^2. Valve area (Vmean): 2.07 cm^2. - Mitral valve: There was mild regurgitation. - Left atrium: The atrium was moderately dilated. - Right atrium: The atrium was mildly dilated. - Technically adequate study.    Assessment and Plan  1. Afib - continued palpitations, not improved with increased beta blocker, does report increased fatigue since dose change - will change toprol to long acting dilt 180mg  daily - continue xarelto for stroke prevention. Repeat BMET with GFR, she may require change in her xarelto dosing now that kidney function has been improving.   2. HTN - at goal, follow with changing to dilt described above    F/u 3 months   Arnoldo Lenis, M.D.

## 2015-07-22 NOTE — Patient Instructions (Signed)
Medication Instructions:  STOP METOPROLOL  START DILTIAZEM 180 MG DAILY  Labwork: Your physician recommends that you return for lab work in: ASAP BMET+GFR   Testing/Procedures: NONE  Follow-Up: Your physician recommends that you schedule a follow-up appointment in: 3 MONTHS    Any Other Special Instructions Will Be Listed Below (If Applicable).     If you need a refill on your cardiac medications before your next appointment, please call your pharmacy.

## 2015-07-23 ENCOUNTER — Other Ambulatory Visit: Payer: Self-pay | Admitting: Cardiology

## 2015-07-23 DIAGNOSIS — I4891 Unspecified atrial fibrillation: Secondary | ICD-10-CM | POA: Diagnosis not present

## 2015-07-24 LAB — BMP8+EGFR
BUN / CREAT RATIO: 14 (ref 11–26)
BUN: 10 mg/dL (ref 8–27)
CALCIUM: 9.3 mg/dL (ref 8.7–10.3)
CHLORIDE: 104 mmol/L (ref 96–106)
CO2: 23 mmol/L (ref 18–29)
Creatinine, Ser: 0.71 mg/dL (ref 0.57–1.00)
GFR calc Af Amer: 87 mL/min/{1.73_m2} (ref >59)
GFR calc non Af Amer: 76 mL/min/{1.73_m2} (ref >59)
GLUCOSE: 104 mg/dL — AB (ref 65–99)
POTASSIUM: 4.6 mmol/L (ref 3.5–5.2)
Sodium: 145 mmol/L — ABNORMAL HIGH (ref 134–144)

## 2015-07-28 ENCOUNTER — Telehealth: Payer: Self-pay | Admitting: Cardiology

## 2015-07-28 ENCOUNTER — Telehealth: Payer: Self-pay

## 2015-07-28 MED ORDER — RIVAROXABAN 20 MG PO TABS: 20.0000 mg | ORAL_TABLET | Freq: Every day | ORAL | Status: DC

## 2015-07-28 NOTE — Telephone Encounter (Signed)
Stopped metoprolol started cardizem   Forward to Dr Harl Bowie

## 2015-07-28 NOTE — Telephone Encounter (Signed)
PT made aware of results, and that we increased her xarelto dosage to 20 mg rather that 15 mg. She said she has 6 more pills and she will pick up the new rx.

## 2015-07-28 NOTE — Telephone Encounter (Signed)
-----   Message from Arnoldo Lenis, MD sent at 07/28/2015 12:37 PM EST ----- Labs reviewed, please let patient know that her kidney functions continue to improve. Because of this we should increase her xarelto to 20mg  daily. She does not have to waste what she currently has but next times she fills it should be 20mg  daily   Zandra Abts MD

## 2015-07-28 NOTE — Telephone Encounter (Signed)
Patient states that ankles are swelling and bottom of feet are burning since starting new medicine. / tg

## 2015-07-29 NOTE — Telephone Encounter (Signed)
Can we try lowering her dose to 120mg  daily. She did not tolerate metoprolol, our other options would be much more complicated if she cannot take diltiazem either   Zandra Abts MD

## 2015-07-30 MED ORDER — METOPROLOL SUCCINATE ER 50 MG PO TB24: 50.0000 mg | ORAL_TABLET | Freq: Every day | ORAL | Status: DC

## 2015-07-30 NOTE — Telephone Encounter (Signed)
To clarify she wants to go back on metoprolol tartrate 25mg  once daily? If so that's ok to try for now, though typically its a twice a day dosed medication    J BrancH MD

## 2015-07-30 NOTE — Telephone Encounter (Signed)
I think that is a good strategy. Have her try taking Toprol XL 50mg  at night. Make sure she is not taking diltiazem or metoprolol tartrate with it   Zandra Abts MD

## 2015-07-30 NOTE — Telephone Encounter (Signed)
I looked at pt's refill hx,she is taking ToprolXL (succinate) 50 mg daily filled on 06/09/15.She is so mixed up,was taking in am and was tired.I uggested she take At night.

## 2015-07-30 NOTE — Telephone Encounter (Signed)
Pt states she will not pay  more money for lower dose of Cardizem , stopping cardizem sx's resolved She wants to go back on She has bot succinate and tartrate listed.Claims Ed doctor put her on tartrate only once a day and that is what she wants to do even if she was not happy taking it.

## 2015-07-30 NOTE — Telephone Encounter (Signed)
Pt not taking cardizem

## 2015-09-08 DIAGNOSIS — H524 Presbyopia: Secondary | ICD-10-CM | POA: Diagnosis not present

## 2015-09-10 ENCOUNTER — Encounter: Payer: Self-pay | Admitting: Cardiology

## 2015-09-22 ENCOUNTER — Encounter: Payer: Self-pay | Admitting: Family Medicine

## 2015-09-22 ENCOUNTER — Ambulatory Visit (INDEPENDENT_AMBULATORY_CARE_PROVIDER_SITE_OTHER): Payer: Medicare Other | Admitting: Family Medicine

## 2015-09-22 VITALS — BP 106/70 | Ht 64.0 in | Wt 162.0 lb

## 2015-09-22 DIAGNOSIS — I480 Paroxysmal atrial fibrillation: Secondary | ICD-10-CM

## 2015-09-22 DIAGNOSIS — F419 Anxiety disorder, unspecified: Secondary | ICD-10-CM

## 2015-09-22 DIAGNOSIS — R739 Hyperglycemia, unspecified: Secondary | ICD-10-CM | POA: Diagnosis not present

## 2015-09-22 DIAGNOSIS — F320 Major depressive disorder, single episode, mild: Secondary | ICD-10-CM | POA: Diagnosis not present

## 2015-09-22 DIAGNOSIS — F329 Major depressive disorder, single episode, unspecified: Secondary | ICD-10-CM | POA: Insufficient documentation

## 2015-09-22 LAB — POCT GLYCOSYLATED HEMOGLOBIN (HGB A1C): Hemoglobin A1C: 5.1

## 2015-09-22 MED ORDER — ALPRAZOLAM 0.5 MG PO TABS: 0.5000 mg | ORAL_TABLET | Freq: Every evening | ORAL | Status: DC | PRN

## 2015-09-22 MED ORDER — ESCITALOPRAM OXALATE 20 MG PO TABS: 20.0000 mg | ORAL_TABLET | Freq: Every day | ORAL | Status: DC

## 2015-09-22 NOTE — Progress Notes (Signed)
   Subjective:    Patient ID: [name], female    DOB: [birth year], 82 y.o.   MRN: [id number]  HPI Patient is here today for a follow up visit on hyperglycemia.  Patient states that her feet are swelling and it started after her Xarelto and Metoprolol were increased.  Patient states that sometimes she gets frustrated about her chronic health not been able to do as much she does state that she is able to get up walk around in her house she can walk around the store. She states she can safely drive she is not had any accidents does not get lost. Patient does follow-up with cardiology on a regular basis. Patient states that the Lexapro is not helping her depression also. Patient relates that at times she feels down and sad about not being able to do as much as she used to be able to do plus also how her son has chronic health issues but at the same time the patient states that she is not suicidal and she understands her certain things that are just not get better another things it well.  Results for orders placed or performed in visit on 09/22/15  POCT glycosylated hemoglobin (Hb A1C)  Result Value Ref Range   Hemoglobin A1C 5.1    Review of Systems Denies chest tightness pressure pain shortness breath nausea vomiting diarrhea.    Objective:   Physical Exam  Lungs clear heart rate controlled irregular extremities no edema skin warm dry neurologic gross normal      Assessment & Plan:  Patient's A1c looks great there is actually no sign of any diabetes going on she is doing a good job watching her diet  Occasional pedal edema patient was told that if he gets severe to follow-up could be related to metoprolol find no evidence of CHF going on currently  Patient tolerating anticoagulant and atrial fibrillation  Moderate depression increase the dose of Lexapro if not in seen any improvement over the next 30 days patient is to let us know otherwise follow-up several months  May use Xanax  at nighttime when necessary to help with sleep. Patient denies abusing it.  Patient frustrated by getting older and not having as much energy but she is making do follow-up again in approximately 4-5 months

## 2015-10-08 ENCOUNTER — Other Ambulatory Visit: Payer: Self-pay | Admitting: *Deleted

## 2015-10-08 ENCOUNTER — Telehealth: Payer: Self-pay | Admitting: Family Medicine

## 2015-10-08 MED ORDER — NITROFURANTOIN MONOHYD MACRO 100 MG PO CAPS: 100.0000 mg | ORAL_CAPSULE | Freq: Two times a day (BID) | ORAL | Status: DC

## 2015-10-08 NOTE — Telephone Encounter (Signed)
Pt called stating that she is having another bout with a uti and is wanting to know if something can be called in. Pt states that she has no one that can bring her down here for an appt. Please advise.

## 2015-10-08 NOTE — Telephone Encounter (Signed)
Patient is having dysuria, lower abdominal pain/pressure and urinary frequency. No fever noted. [place]

## 2015-10-08 NOTE — Telephone Encounter (Signed)
macrobid 100mg  one 2 times a day for 7 days-fu if ongoing or worse

## 2015-10-08 NOTE — Telephone Encounter (Signed)
Discussed with pt. Med sent to pharm.  

## 2015-10-12 ENCOUNTER — Other Ambulatory Visit: Payer: Self-pay | Admitting: Family Medicine

## 2015-10-16 DIAGNOSIS — W458XXA Other foreign body or object entering through skin, initial encounter: Secondary | ICD-10-CM | POA: Diagnosis not present

## 2015-10-16 DIAGNOSIS — W57XXXA Bitten or stung by nonvenomous insect and other nonvenomous arthropods, initial encounter: Secondary | ICD-10-CM | POA: Diagnosis not present

## 2015-11-04 ENCOUNTER — Ambulatory Visit: Payer: Medicare Other | Admitting: Cardiology

## 2015-11-09 ENCOUNTER — Telehealth: Payer: Self-pay

## 2015-11-09 ENCOUNTER — Ambulatory Visit (INDEPENDENT_AMBULATORY_CARE_PROVIDER_SITE_OTHER): Payer: Medicare Other | Admitting: Cardiology

## 2015-11-09 ENCOUNTER — Encounter: Payer: Self-pay | Admitting: Cardiology

## 2015-11-09 VITALS — BP 138/88 | HR 64 | Ht 64.0 in | Wt 163.0 lb

## 2015-11-09 DIAGNOSIS — R5383 Other fatigue: Secondary | ICD-10-CM

## 2015-11-09 DIAGNOSIS — R002 Palpitations: Secondary | ICD-10-CM

## 2015-11-09 DIAGNOSIS — R600 Localized edema: Secondary | ICD-10-CM

## 2015-11-09 DIAGNOSIS — I4891 Unspecified atrial fibrillation: Secondary | ICD-10-CM | POA: Diagnosis not present

## 2015-11-09 DIAGNOSIS — I10 Essential (primary) hypertension: Secondary | ICD-10-CM | POA: Diagnosis not present

## 2015-11-09 MED ORDER — FUROSEMIDE 20 MG PO TABS: 20.0000 mg | ORAL_TABLET | Freq: Every day | ORAL | Status: DC

## 2015-11-09 NOTE — Progress Notes (Addendum)
Clinical Summary [name] is a 82 y.o.female seen today for follow up of the following medical problems.   1. Afib - metoprolol makes her tired, worst with dose increase. Did not improve palpitations - last visit changed to dilttiazem due to ongoing palpitations with increased beta blocker dose and fatigue.  - cardizem caused leg swelling and burning on the bottom of her feet. She restarted lopressor and stopped dilt - continued fatigue. Can feel her heart fluttering primarily with activity.    2. Anxiety - followed by Dr Wolfgang Phoenix  3. HTN - compliant with meds  4. Chronic fatigue - ongoing for 4 years. Increased SOB/DOE with mild activities, example working with her flowers. - chroinc feeling of heart skipping   5. LE edema - noted recently, bilateral leg swelling. No significant SOB or DOE - minimal salt in diet.     SH: her sister is also a patient of mine, Hydrographic surveyor  Past Medical History  Diagnosis Date  . Hypertension   . Atrial fibrillation ([place])   . Cancer ([place])     left side  . GERD (gastroesophageal reflux disease)   . Hyperlipidemia   . Osteoporosis   . Osteopenia   . Edema   . Chronic back pain   . Abdominal pain, chronic, right lower quadrant     "ever since I had my appendix removed"  . Recurrent abdominal pain     LLQ  . Ovarian cyst 02/2015    left     Allergies  Allergen Reactions  . Amoxicillin Hives  . Cephalosporins Itching  . Hydrocodone Itching and Nausea Only    In large doses Made her feel like she was in somebody else's house when getting up at night after taking it  . Lipitor [Atorvastatin] Other (See Comments)    unknown  . Lorazepam Other (See Comments)    Exceptional fatigue   . Lovenox [Enoxaparin Sodium] Nausea And Vomiting  . Morphine And Related Itching  . Penicillins Swelling    Has patient had a PCN reaction causing immediate rash, facial/tongue/throat swelling, SOB or lightheadedness with hypotension:  unknown Has patient had a PCN reaction causing severe rash involving mucus membranes or skin necrosis: unknown Has patient had a PCN reaction that required hospitalization No Has patient had a PCN reaction occurring within the last 10 years: no If all of the above answers are "NO", then may proceed with Cephalosporin use.   . Sulfa Antibiotics Swelling     Current Outpatient Prescriptions  Medication Sig Dispense Refill  . ALPRAZolam (XANAX) 0.5 MG tablet Take 1-2 tablets (0.5-1 mg total) by mouth at bedtime as needed for anxiety. 30 tablet 5  . escitalopram (LEXAPRO) 10 MG tablet TAKE 1 TABLET BY MOUTH ONCE A DAY. 30 tablet 0  . escitalopram (LEXAPRO) 20 MG tablet Take 1 tablet (20 mg total) by mouth daily. 30 tablet 6  . metoprolol succinate (TOPROL-XL) 50 MG 24 hr tablet Take 1 tablet (50 mg total) by mouth daily. Take with or immediately following a meal. 90 tablet 3  . nitrofurantoin, macrocrystal-monohydrate, (MACROBID) 100 MG capsule Take 1 capsule (100 mg total) by mouth 2 (two) times daily. 14 capsule 0  . rivaroxaban (XARELTO) 20 MG TABS tablet Take 1 tablet (20 mg total) by mouth daily with supper. 30 tablet 6  . traMADol (ULTRAM) 50 MG tablet TAKE (1) TABLET BY MOUTH EVERY (6) HOURS AS NEEDED. 20 tablet 4  . VITAMIN D, ERGOCALCIFEROL, PO Take  1 tablet by mouth daily.     No current facility-administered medications for this visit.     Past Surgical History  Procedure Laterality Date  . Breast surgery    . Appendectomy    . Hernia repair    . Colon surgery       Allergies  Allergen Reactions  . Amoxicillin Hives  . Cephalosporins Itching  . Hydrocodone Itching and Nausea Only    In large doses Made her feel like she was in somebody else's house when getting up at night after taking it  . Lipitor [Atorvastatin] Other (See Comments)    unknown  . Lorazepam Other (See Comments)    Exceptional fatigue   . Lovenox [Enoxaparin Sodium] Nausea And Vomiting  .  Morphine And Related Itching  . Penicillins Swelling    Has patient had a PCN reaction causing immediate rash, facial/tongue/throat swelling, SOB or lightheadedness with hypotension: unknown Has patient had a PCN reaction causing severe rash involving mucus membranes or skin necrosis: unknown Has patient had a PCN reaction that required hospitalization No Has patient had a PCN reaction occurring within the last 10 years: no If all of the above answers are "NO", then may proceed with Cephalosporin use.   . Sulfa Antibiotics Swelling      Family History  Problem Relation Age of Onset  . Heart disease    . Arthritis    . Cancer    . Diabetes Mother   . Hyperlipidemia Brother      Social History [name] reports that she has never smoked. She has never used smokeless tobacco. [name] reports that she does not drink alcohol.   Review of Systems CONSTITUTIONAL: +fatigue HEENT: Eyes: No visual loss, blurred vision, double vision or yellow sclerae.No hearing loss, sneezing, congestion, runny nose or sore throat.  SKIN: No rash or itching.  CARDIOVASCULAR: per HPI RESPIRATORY: No shortness of breath, cough or sputum.  GASTROINTESTINAL: No anorexia, nausea, vomiting or diarrhea. No abdominal pain or blood.  GENITOURINARY: No burning on urination, no polyuria NEUROLOGICAL: No headache, dizziness, syncope, paralysis, ataxia, numbness or tingling in the extremities. No change in bowel or bladder control.  MUSCULOSKELETAL: No muscle, back pain, joint pain or stiffness.  LYMPHATICS: No enlarged nodes. No history of splenectomy.  PSYCHIATRIC: No history of depression or anxiety.  ENDOCRINOLOGIC: No reports of sweating, cold or heat intolerance. No polyuria or polydipsia.  Marland Kitchen   Physical Examination Filed Vitals:   11/09/15 1351  BP: 138/88  Pulse: 64   Filed Vitals:   11/09/15 1351  Height: 5\' 4"  (1.626 m)  Weight: 163 lb (73.936 kg)    Gen: resting comfortably, no acute  distress HEENT: no scleral icterus, pupils equal round and reactive, no palptable cervical adenopathy,  CV: irreg, no m/r/g, no jvd Resp: Clear to auscultation bilaterally GI: abdomen is soft, non-tender, non-distended, normal bowel sounds, no hepatosplenomegaly MSK: extremities are warm, no edema.  Skin: warm, no rash Neuro:  no focal deficits Psych: appropriate affect   Diagnostic Studies 01/2014 MPI IMPRESSION: 1. No reversible ischemia or infarction.  2. Normal left ventricular wall motion.  3. Left ventricular ejection fraction 43%, borderline/mildly decreased function by nuclear standards. Recommend correlate with echo.  4. Intermediaterisk stress test findings based on mildly decreased LV systolic function, there is no myocardium at jeopardy*.  12/2013 Echo Study Conclusions  - Left ventricle: The cavity size was normal. Wall thickness was increased in a pattern of mild LVH. Systolic function was  normal. The estimated ejection fraction was in the range of 60% to 65%. Wall motion was normal; there were no regional wall motion abnormalities. - Aortic valve: Valve area (VTI): 1.92 cm^2. Valve area (Vmax): 1.95 cm^2. Valve area (Vmean): 2.07 cm^2. - Mitral valve: There was mild regurgitation. - Left atrium: The atrium was moderately dilated. - Right atrium: The atrium was mildly dilated. - Technically adequate study.     Assessment and Plan  1. Afib - continued palpitations, not improved with increased beta blocker. Did not tolerate diltiazem due ot side effects - EKG in clinic shows rate controlled afib - continued fatigue and palpitations. We will try a rhythm control strategy to see if symptoms improve, start amiodarone 200mg  bid for 1 month then 200mg  daily. Continue beta blocker for now but likely stop in near future as this could be contributing to fatigue. Discuss DCCV after loaded on amio.  - continue xarelto for stroke prevention.  2. HTN - at  goal, she will continue current meds  3. LE edema - echo 12/2013 LVEF 60-65%, cannot describe diastolic function in setting of afib - start lasix 20mg  prn    F/u 3-4 weeks      Arnoldo Lenis, M.D.

## 2015-11-09 NOTE — Telephone Encounter (Signed)
Spoke to pt to ask her to bring all medication ( pill bottles ) with her to her appointment this afternoon. She agreed.

## 2015-11-09 NOTE — Patient Instructions (Signed)
Medication Instructions:  START LASIX 20 MG DAILY AS NEEDED FOR SWELLING   Labwork: NONE  Testing/Procedures: NONE  Follow-Up: Your physician recommends that you schedule a follow-up appointment in: 6 WEEKS    Any Other Special Instructions Will Be Listed Below (If Applicable).     If you need a refill on your cardiac medications before your next appointment, please call your pharmacy.

## 2015-11-11 ENCOUNTER — Telehealth: Payer: Self-pay | Admitting: *Deleted

## 2015-11-11 MED ORDER — AMIODARONE HCL 200 MG PO TABS: ORAL_TABLET | ORAL | Status: DC

## 2015-11-11 NOTE — Telephone Encounter (Signed)
-----   Message from Arnoldo Lenis, MD sent at 11/10/2015  4:42 PM EDT ----- For Ms [name] I'd like to start her on amiodarone 200mg  bid for 1 month, then change to 200mg  daily. She is to continue all her other medications. She needs f/u in 3-4 weeks.    Zandra Abts MD

## 2015-12-12 ENCOUNTER — Encounter (HOSPITAL_COMMUNITY): Payer: Self-pay | Admitting: Emergency Medicine

## 2015-12-12 ENCOUNTER — Emergency Department (HOSPITAL_COMMUNITY): Payer: Medicare Other

## 2015-12-12 ENCOUNTER — Emergency Department (HOSPITAL_COMMUNITY)
Admission: EM | Admit: 2015-12-12 | Discharge: 2015-12-12 | Disposition: A | Discharge status: Home / Self Care | Payer: Medicare Other | Attending: Emergency Medicine | Admitting: Emergency Medicine

## 2015-12-12 DIAGNOSIS — I4891 Unspecified atrial fibrillation: Secondary | ICD-10-CM | POA: Diagnosis not present

## 2015-12-12 DIAGNOSIS — Z79899 Other long term (current) drug therapy: Secondary | ICD-10-CM | POA: Insufficient documentation

## 2015-12-12 DIAGNOSIS — E785 Hyperlipidemia, unspecified: Secondary | ICD-10-CM | POA: Insufficient documentation

## 2015-12-12 DIAGNOSIS — R030 Elevated blood-pressure reading, without diagnosis of hypertension: Secondary | ICD-10-CM | POA: Diagnosis not present

## 2015-12-12 DIAGNOSIS — I10 Essential (primary) hypertension: Secondary | ICD-10-CM | POA: Insufficient documentation

## 2015-12-12 DIAGNOSIS — M25561 Pain in right knee: Secondary | ICD-10-CM | POA: Insufficient documentation

## 2015-12-12 LAB — CBC WITH DIFFERENTIAL/PLATELET
Basophils Absolute: 0 10*3/uL (ref 0.0–0.1)
Basophils Relative: 0 %
EOS ABS: 0.1 10*3/uL (ref 0.0–0.7)
EOS PCT: 2 %
HCT: 42.1 % (ref 36.0–46.0)
HEMOGLOBIN: 13.7 g/dL (ref 12.0–15.0)
LYMPHS ABS: 1.9 10*3/uL (ref 0.7–4.0)
Lymphocytes Relative: 28 %
MCH: 28.5 pg (ref 26.0–34.0)
MCHC: 32.5 g/dL (ref 30.0–36.0)
MCV: 87.5 fL (ref 78.0–100.0)
MONOS PCT: 11 %
Monocytes Absolute: 0.7 10*3/uL (ref 0.1–1.0)
NEUTROS PCT: 59 %
Neutro Abs: 4 10*3/uL (ref 1.7–7.7)
Platelets: 244 10*3/uL (ref 150–400)
RBC: 4.81 MIL/uL (ref 3.87–5.11)
RDW: 12.8 % (ref 11.5–15.5)
WBC: 6.7 10*3/uL (ref 4.0–10.5)

## 2015-12-12 LAB — BASIC METABOLIC PANEL
Anion gap: 6 (ref 5–15)
BUN: 17 mg/dL (ref 6–20)
CHLORIDE: 104 mmol/L (ref 101–111)
CO2: 29 mmol/L (ref 22–32)
CREATININE: 0.72 mg/dL (ref 0.44–1.00)
Calcium: 9.1 mg/dL (ref 8.9–10.3)
GFR calc Af Amer: >60 mL/min (ref >60)
GFR calc non Af Amer: >60 mL/min (ref >60)
GLUCOSE: 111 mg/dL — AB (ref 65–99)
Potassium: 4.3 mmol/L (ref 3.5–5.1)
SODIUM: 139 mmol/L (ref 135–145)

## 2015-12-12 LAB — I-STAT TROPONIN, ED: TROPONIN I, POC: 0 ng/mL (ref 0.00–0.08)

## 2015-12-12 MED ORDER — AMLODIPINE BESYLATE 10 MG PO TABS: 10.0000 mg | ORAL_TABLET | Freq: Every day | ORAL | Status: DC

## 2015-12-12 NOTE — ED Notes (Signed)
Dr Jeanell Sparrow in to reassess

## 2015-12-12 NOTE — ED Notes (Signed)
Requests to go to [facility] where son states he will pick her up

## 2015-12-12 NOTE — Discharge Instructions (Signed)
Take new medicine as prescribed. Call Dr. Harl Bowie Monday for recheck this week.

## 2015-12-12 NOTE — ED Notes (Signed)
BP has been high BP for 5 days.  BP this am 188/90.  C/o headache since yesterday, rates pain 6/10.  C/o right knee pain, rates pain 2/10 but increases to 10/10.

## 2015-12-12 NOTE — ED Provider Notes (Signed)
CSN: [id number]     Arrival date & time 12/12/15  1516 History   First MD Initiated Contact with Patient 12/12/15 1712     Chief Complaint  Patient presents with  . Hypertension     (Consider location/radiation/quality/duration/timing/severity/associated sxs/prior Treatment) HPI  82 y.o. Female ho a fib presents complaining of hypertensio for five days.  States Dr. branch start her on amiodarone but she has stopped taking that as she had a rash. She has been continuing to take her metacarpal twice a day. She denies any chest pain or current headache. She is having some right knee pain. She denies any fall on her right knee but did state that she fell on her right hip recently. She denies difficulty walking. She states she has had to have something done to her right knee in the past and describes an arthrocentesis. She denies any redness or fever.  Past Medical History  Diagnosis Date  . Hypertension   . Atrial fibrillation ([place])   . Cancer ([place])     left side  . GERD (gastroesophageal reflux disease)   . Hyperlipidemia   . Osteoporosis   . Osteopenia   . Edema   . Chronic back pain   . Abdominal pain, chronic, right lower quadrant     "ever since I had my appendix removed"  . Recurrent abdominal pain     LLQ  . Ovarian cyst 02/2015    left   Past Surgical History  Procedure Laterality Date  . Breast surgery    . Appendectomy    . Hernia repair    . Colon surgery     Family History  Problem Relation Age of Onset  . Heart disease    . Arthritis    . Cancer    . Diabetes Mother   . Hyperlipidemia Brother    Social History  Substance Use Topics  . Smoking status: Never Smoker   . Smokeless tobacco: Never Used  . Alcohol Use: No   OB History    Gravida Para Term Preterm AB TAB SAB Ectopic Multiple Living   2 1   1  1   1      Review of Systems    Allergies  Amiodarone; Amoxicillin; Cephalosporins; Hydrocodone; Lipitor; Lorazepam; Lovenox; Morphine and related;  Penicillins; and Sulfa antibiotics  Home Medications   Prior to Admission medications   Medication Sig Start Date End Date Taking? Authorizing Provider  ALPRAZolam Duanne Moron) 0.5 MG tablet Take 1-2 tablets (0.5-1 mg total) by mouth at bedtime as needed for anxiety. Patient taking differently: Take 0.25-1 mg by mouth at bedtime as needed for anxiety.  09/22/15  Yes [name], MD  escitalopram (LEXAPRO) 10 MG tablet TAKE 1 TABLET BY MOUTH ONCE A DAY. Patient taking differently: TAKE 1 TABLET BY MOUTH ONCE A DAY AS NEEDED FOR MOOD/DEPRESSION 10/13/15  Yes [name], MD  metoprolol succinate (TOPROL-XL) 50 MG 24 hr tablet Take 50 mg by mouth daily. Take with or immediately following a meal.   Yes Historical Provider, MD  metoprolol tartrate (LOPRESSOR) 25 MG tablet Take 12.5 mg by mouth every 6 (six) hours as needed (for increased heart rate). 12.5 mg every 6 hrs as needed for increased heart rate   Yes Historical Provider, MD  rivaroxaban (XARELTO) 20 MG TABS tablet Take 1 tablet (20 mg total) by mouth daily with supper. 07/28/15  Yes Arnoldo Lenis, MD  amiodarone (PACERONE) 200 MG tablet Take 200mg  Two Times Daily for  1 month, then Change to 200mg  Daily Patient not taking: Reported on 12/12/2015 11/11/15   Arnoldo Lenis, MD  furosemide (LASIX) 20 MG tablet Take 1 tablet (20 mg total) by mouth daily. Patient not taking: Reported on 12/12/2015 11/09/15   Arnoldo Lenis, MD  traMADol (ULTRAM) 50 MG tablet TAKE (1) TABLET BY MOUTH EVERY (6) HOURS AS NEEDED. 06/23/15   [name], MD   BP 160/118 mmHg  Pulse 50  Temp(Src) 98.4 F (36.9 C) (Oral)  Resp 18  Ht 5' 4.5" (1.638 m)  Wt 71.668 kg  BMI 26.71 kg/m2  SpO2 83% Physical Exam  Constitutional: She is oriented to person, place, and time. She appears well-developed and well-nourished. No distress.  HENT:  Head: Normocephalic and atraumatic.  Right Ear: External ear normal.  Left Ear: External ear normal.  Nose: Nose normal.   Eyes: Conjunctivae and EOM are normal. Pupils are equal, round, and reactive to light.  Neck: Normal range of motion. Neck supple.  Pulmonary/Chest: Effort normal.  Musculoskeletal: Normal range of motion.  Right knee with mild swelling, no erythema or warmth  Neurological: She is alert and oriented to person, place, and time. She exhibits normal muscle tone. Coordination normal.  Skin: Skin is warm and dry.  Psychiatric: She has a normal mood and affect. Her behavior is normal. Thought content normal.  Nursing note and vitals reviewed.   ED Course  Procedures (including critical care time) Labs Review Labs Reviewed  CBC WITH DIFFERENTIAL/PLATELET  BASIC METABOLIC PANEL  Randolm Idol, ED    Imaging Review Dg Chest 2 View  12/12/2015  CLINICAL DATA:  Patient with elevated blood pressure. Generalized weakness. EXAM: CHEST  2 VIEW COMPARISON:  Chest radiograph 05/17/2015. FINDINGS: Monitoring leads overlie the patient. Stable enlarged cardiac and mediastinal contours with tortuosity of the thoracic aorta. No consolidative pulmonary opacities. No pleural effusion or pneumothorax. Thoracic spine degenerative changes. IMPRESSION: No acute cardiopulmonary process. Electronically Signed   By: Lovey Newcomer M.D.   On: 12/12/2015 18:57   I have personally reviewed and evaluated these images and lab results as part of my medical decision-making.   EKG Interpretation   Date/Time:  Saturday December 12 2015 18:02:23 EDT Ventricular Rate:  55 PR Interval:    QRS Duration: 91 QT Interval:  482 QTC Calculation: 461 R Axis:   35 Text Interpretation:  Sinus rhythm Prolonged PR interval Consider left  atrial enlargement HEART RATE DECREASED SINCE last tracing Confirmed by  Euline Kimbler MD, Andee Poles [id number]) on 12/12/2015 6:42:38 PM      MDM   Final diagnoses:  Essential hypertension  Knee pain, right     Discussed with Dr. Rosanna Randy, on call for cardiology, and suggests strting norvasc.  RX norvasc  10 mg q day and patient advised recheck with Dr. Harl Bowie this week.    Pattricia Boss, MD 12/12/15 Lurline Hare

## 2015-12-15 ENCOUNTER — Ambulatory Visit (INDEPENDENT_AMBULATORY_CARE_PROVIDER_SITE_OTHER): Payer: Medicare Other | Admitting: Family Medicine

## 2015-12-15 ENCOUNTER — Encounter: Payer: Self-pay | Admitting: Family Medicine

## 2015-12-15 VITALS — BP 140/74 | Temp 98.6°F | Ht 64.0 in | Wt 161.0 lb

## 2015-12-15 DIAGNOSIS — I4891 Unspecified atrial fibrillation: Secondary | ICD-10-CM | POA: Diagnosis not present

## 2015-12-15 DIAGNOSIS — F320 Major depressive disorder, single episode, mild: Secondary | ICD-10-CM | POA: Diagnosis not present

## 2015-12-15 DIAGNOSIS — M1711 Unilateral primary osteoarthritis, right knee: Secondary | ICD-10-CM | POA: Insufficient documentation

## 2015-12-15 MED ORDER — HYDROCODONE-ACETAMINOPHEN 5-325 MG PO TABS: ORAL_TABLET | ORAL | 0 refills | Status: DC

## 2015-12-15 MED ORDER — DULOXETINE HCL 20 MG PO CPEP: 20.0000 mg | ORAL_CAPSULE | Freq: Every day | ORAL | 3 refills | Status: DC

## 2015-12-15 NOTE — Progress Notes (Signed)
   Subjective:    Patient ID: [name], female    DOB: Apr 22, 1924, 82 y.o.   MRN: [id number]  HPIright knee pain for several months. Went to ED on July 22nd.  At the ER they told her blood pressure was significantly elevated they recommend for her to start amlodipine Blood pressure has been elevated. Prescribed amlodipine 10mg  at ED. Pt never did fill. She wanted to get dr Nicki Reaper opinion first.   One medication was making her itch so she stopped lexapro and tramadol. Pt stopped meds for 2 days.  Patient states that she feels she's doing better since stopping the Lexapro tramadol. Ankles are swelling some. Pt has stopped lasix.  She does relate a fair amount of depression feeling down and sad. She denies any suicidal ideation She denies any bleeding issues and states heart rate is controlled with her medicine Review of Systems     Objective:   Physical Exam  Constitutional: She appears well-nourished. No distress.  Cardiovascular: Normal rate and normal heart sounds.   No murmur heard. Pulmonary/Chest: Effort normal and breath sounds normal. No respiratory distress.  Musculoskeletal: She exhibits no edema.  Lymphadenopathy:    She has no cervical adenopathy.  Neurological: She is alert. She exhibits normal muscle tone.  Psychiatric: Her behavior is normal.  Vitals reviewed.   25 minutes was spent with the patient. Greater than half the time was spent in discussion and answering questions and counseling regarding the issues that the patient came in for today.  On today's exam I do not find any evidence of swelling     Assessment & Plan:  Severe osteoarthritis of the right knee-referral back to orthopedist that she saw in [facility] several years back. Please check paper chart  Pain control hydrocodone half tablet every 6 hours as needed for severe pain caution drowsiness  Atrial fibrillation on blood thinner no sign of any bleeding issues heart rate control blood  pressure is good today I did advise the patient not to get started on amlodipine that the ER told her to take. I do not feel she needs it. Blood pressure is good. She will follow-up with cardiology.  Depression use generic Cymbalta 20 mg this might also help her knee as well as her moods we'll recheck her again in a proximally 3 months possibly increase the dose of the medication depending on how she is doing if she feels if she is having any prompt she will follow-up sooner.

## 2015-12-15 NOTE — Patient Instructions (Addendum)
Recheck here in 3 months  Will try generic Cymbalta for depression ( it might also help your pain)  You may use hydrocodone 5 mg tablet one half tablet every 6 hours as needed for severe pain caution drowsiness  We will be setting you up with the orthopedist in [facility] our staff should notify you of the appointment somewhere within the next 2 weeks  I will send a copy of today's note to Dr. Harl Bowie

## 2015-12-17 ENCOUNTER — Ambulatory Visit (INDEPENDENT_AMBULATORY_CARE_PROVIDER_SITE_OTHER): Payer: Medicare Other | Admitting: Cardiology

## 2015-12-17 ENCOUNTER — Encounter: Payer: Self-pay | Admitting: Cardiology

## 2015-12-17 VITALS — BP 144/72 | HR 67 | Ht 64.0 in | Wt 159.0 lb

## 2015-12-17 DIAGNOSIS — I4891 Unspecified atrial fibrillation: Secondary | ICD-10-CM | POA: Diagnosis not present

## 2015-12-17 DIAGNOSIS — R002 Palpitations: Secondary | ICD-10-CM | POA: Diagnosis not present

## 2015-12-17 DIAGNOSIS — I10 Essential (primary) hypertension: Secondary | ICD-10-CM

## 2015-12-17 NOTE — Progress Notes (Signed)
Clinical Summary Ms. Szymanski is a 82 y.o.female seen today for follow up of the following medical problems. This is a focused visit on her history of afib.   1. Afib - metoprolol makes her tired, higher dose made her more fatigued and did not improve her palpitatins.  - last visit changed to dilttiazem due to ongoing palpitations with increased beta blocker dose and fatigue.  - cardizem caused leg swelling and burning on the bottom of her feet. She restarted lopressor and stopped dilt - last visit due to side effects and ineffectiveness of AV nodal agents started amiodarone.  - she stopped on her own due to skin itching, but resumed and symptoms have since resolve.d  - still with some palpitations, though does notice some improvement. - seen in ER 12/12/15 with elevated bp, EKG at that time showed NSR.     2. HTN - transient episode of HTN, seen in ER 12/12/15. She did not start norvasc as recommended at that visit. BP has since normalized on prior regimen  SH: her sister is also a patient of mine, Hydrographic surveyor  Past Medical History:  Diagnosis Date  . Abdominal pain, chronic, right lower quadrant    "ever since I had my appendix removed"  . Atrial fibrillation ([place])   . Cancer ([place])    left side  . Chronic back pain   . Edema   . GERD (gastroesophageal reflux disease)   . Hyperlipidemia   . Hypertension   . Osteopenia   . Osteoporosis   . Ovarian cyst 02/2015   left  . Recurrent abdominal pain    LLQ     Allergies  Allergen Reactions  . Amiodarone     itching  . Amoxicillin Hives  . Cephalosporins Itching  . Hydrocodone Itching and Nausea Only    In large doses Made her feel like she was in somebody else's house when getting up at night after taking it  . Lipitor [Atorvastatin] Other (See Comments)    unknown  . Lorazepam Other (See Comments)    Exceptional fatigue   . Lovenox [Enoxaparin Sodium] Nausea And Vomiting  . Morphine And Related Itching  .  Penicillins Swelling    Has patient had a PCN reaction causing immediate rash, facial/tongue/throat swelling, SOB or lightheadedness with hypotension: unknown Has patient had a PCN reaction causing severe rash involving mucus membranes or skin necrosis: unknown Has patient had a PCN reaction that required hospitalization No Has patient had a PCN reaction occurring within the last 10 years: no If all of the above answers are "NO", then may proceed with Cephalosporin use.   . Sulfa Antibiotics Swelling     Current Outpatient Prescriptions  Medication Sig Dispense Refill  . ALPRAZolam (XANAX) 0.5 MG tablet Take 1-2 tablets (0.5-1 mg total) by mouth at bedtime as needed for anxiety. (Patient taking differently: Take 0.25-1 mg by mouth at bedtime as needed for anxiety. ) 30 tablet 5  . amiodarone (PACERONE) 200 MG tablet Take 200mg  Two Times Daily for 1 month, then Change to 200mg  Daily 90 tablet 3  . diltiazem (CARDIZEM CD) 180 MG 24 hr capsule Take 180 mg by mouth daily.    . DULoxetine (CYMBALTA) 20 MG capsule Take 1 capsule (20 mg total) by mouth daily. 30 capsule 3  . furosemide (LASIX) 20 MG tablet Take 1 tablet (20 mg total) by mouth daily. (Patient not taking: Reported on 12/12/2015) 90 tablet 3  . HYDROcodone-acetaminophen (NORCO/VICODIN) 5-325 MG  tablet 1/2 tablet every 6 hours as needed-caution drowsiness 30 tablet 0  . metoprolol succinate (TOPROL-XL) 50 MG 24 hr tablet Take 50 mg by mouth daily. Take with or immediately following a meal.    . metoprolol tartrate (LOPRESSOR) 25 MG tablet Take 12.5 mg by mouth every 6 (six) hours as needed (for increased heart rate). 12.5 mg every 6 hrs as needed for increased heart rate    . rivaroxaban (XARELTO) 20 MG TABS tablet Take 1 tablet (20 mg total) by mouth daily with supper. 30 tablet 6   No current facility-administered medications for this visit.      Past Surgical History:  Procedure Laterality Date  . APPENDECTOMY    . BREAST  SURGERY    . COLON SURGERY    . HERNIA REPAIR       Allergies  Allergen Reactions  . Amiodarone     itching  . Amoxicillin Hives  . Cephalosporins Itching  . Hydrocodone Itching and Nausea Only    In large doses Made her feel like she was in somebody else's house when getting up at night after taking it  . Lipitor [Atorvastatin] Other (See Comments)    unknown  . Lorazepam Other (See Comments)    Exceptional fatigue   . Lovenox [Enoxaparin Sodium] Nausea And Vomiting  . Morphine And Related Itching  . Penicillins Swelling    Has patient had a PCN reaction causing immediate rash, facial/tongue/throat swelling, SOB or lightheadedness with hypotension: unknown Has patient had a PCN reaction causing severe rash involving mucus membranes or skin necrosis: unknown Has patient had a PCN reaction that required hospitalization No Has patient had a PCN reaction occurring within the last 10 years: no If all of the above answers are "NO", then may proceed with Cephalosporin use.   . Sulfa Antibiotics Swelling      Family History  Problem Relation Age of Onset  . Heart disease    . Arthritis    . Cancer    . Diabetes Mother   . Hyperlipidemia Brother      Social History Ms. Escajeda reports that she has never smoked. She has never used smokeless tobacco. Ms. Wees reports that she does not drink alcohol.   Review of Systems CONSTITUTIONAL: No weight loss, fever, chills, weakness or fatigue.  HEENT: Eyes: No visual loss, blurred vision, double vision or yellow sclerae.No hearing loss, sneezing, congestion, runny nose or sore throat.  SKIN: No rash or itching.  CARDIOVASCULAR: per HPI RESPIRATORY: No shortness of breath, cough or sputum.  GASTROINTESTINAL: No anorexia, nausea, vomiting or diarrhea. No abdominal pain or blood.  GENITOURINARY: No burning on urination, no polyuria NEUROLOGICAL: No headache, dizziness, syncope, paralysis, ataxia, numbness or tingling in the  extremities. No change in bowel or bladder control.  MUSCULOSKELETAL: No muscle, back pain, joint pain or stiffness.  LYMPHATICS: No enlarged nodes. No history of splenectomy.  PSYCHIATRIC: No history of depression or anxiety.  ENDOCRINOLOGIC: No reports of sweating, cold or heat intolerance. No polyuria or polydipsia.  Marland Kitchen   Physical Examination Vitals:   12/17/15 1404  BP: (!) 144/72  Pulse: 67   Vitals:   12/17/15 1404  Weight: 159 lb (72.1 kg)  Height: 5\' 4"  (1.626 m)    Gen: resting comfortably, no acute distress HEENT: no scleral icterus, pupils equal round and reactive, no palptable cervical adenopathy,  CV: RRR, no m//r, no jvd Resp: Clear to auscultation bilaterally GI: abdomen is soft, non-tender, non-distended, normal bowel sounds,  no hepatosplenomegaly MSK: extremities are warm, no edema.  Skin: warm, no rash Neuro:  no focal deficits Psych: appropriate affect   Diagnostic Studies  01/2014 MPI IMPRESSION: 1. No reversible ischemia or infarction.  2. Normal left ventricular wall motion.  3. Left ventricular ejection fraction 43%, borderline/mildly decreased function by nuclear standards. Recommend correlate with echo.  4. Intermediaterisk stress test findings based on mildly decreased LV systolic function, there is no myocardium at jeopardy*.  12/2013 Echo Study Conclusions  - Left ventricle: The cavity size was normal. Wall thickness was increased in a pattern of mild LVH. Systolic function was normal. The estimated ejection fraction was in the range of 60% to 65%. Wall motion was normal; there were no regional wall motion abnormalities. - Aortic valve: Valve area (VTI): 1.92 cm^2. Valve area (Vmax): 1.95 cm^2. Valve area (Vmean): 2.07 cm^2. - Mitral valve: There was mild regurgitation. - Left atrium: The atrium was moderately dilated. - Right atrium: The atrium was mildly dilated. - Technically adequate study.    Assessment and  Plan  1. Afib - side effects on higher dose AV nodal agents, did not resolve her symptoms of palpitatoins - started on amiodarone, EKG 12/12/15 showed NSR however EKG in clinic today shows aflutter with rate controlled - she will take amio 200mg  bid additional 2 weeks, then decrease to 200mg  daily - CHADS2Vasc score of 4, continue anticoag   2. HTN - at goal, she will continue current meds    F/u 3 weeks.     Arnoldo Lenis, M.D.

## 2015-12-17 NOTE — Patient Instructions (Signed)
Medication Instructions:   Take Amidiorone 200 mg two times daily for the next two weeks - then go to 200 mg daily   Labwork: none  Testing/Procedures: none  Follow-Up: Your physician recommends that you schedule a follow-up appointment in: 3 weeks    Any Other Special Instructions Will Be Listed Below (If Applicable).     If you need a refill on your cardiac medications before your next appointment, please call your pharmacy.

## 2015-12-22 ENCOUNTER — Encounter: Payer: Self-pay | Admitting: Family Medicine

## 2015-12-31 DIAGNOSIS — M1711 Unilateral primary osteoarthritis, right knee: Secondary | ICD-10-CM | POA: Diagnosis not present

## 2016-01-07 ENCOUNTER — Encounter: Payer: Self-pay | Admitting: Adult Health

## 2016-01-07 ENCOUNTER — Ambulatory Visit (INDEPENDENT_AMBULATORY_CARE_PROVIDER_SITE_OTHER): Payer: Medicare Other | Admitting: Adult Health

## 2016-01-07 VITALS — BP 140/74 | HR 76 | Ht 64.0 in | Wt 160.0 lb

## 2016-01-07 DIAGNOSIS — Z8679 Personal history of other diseases of the circulatory system: Secondary | ICD-10-CM | POA: Diagnosis not present

## 2016-01-07 NOTE — Progress Notes (Signed)
Cardiology Office Note   Date:  01/07/2016   ID:  [name], DOB 07-20-1923, MRN [id number]  PCP:  Sallee Lange, MD  Cardiologist: Cloria Spring, NP   No chief complaint on file.     History of Present Illness: [name] is a 82 y.o. female who presents for ongoing assessment and management of atrial fibrillation,she is intolerant to many heart rate control medications, she was unable to take diltiazem, beta blocker, due to side effects of edema and significant fatigue. She was also started on amiodarone but stopped this on her own due to skin itching. On last office visit she had no resumption of symptoms, actually having improvement in her symptoms with minimal palpitations off medications. She was seen in the emergency room in July of 2017 due to hypertension was started on amlodipine but did not start this as well as her blood pressure normalized at home.  On last office visit on 12/17/2015 the patient was found to be in atrial flutter with heart rate control by EKG in the office. She was started on 200 mg twice a day for 2 more weeks and then to decrease to 200 mg daily. She is here for followup concerning any side effects along with compliance. CHADS VASC Score of 4 and has been continued on XARELTO 20 mg daily.  She states that she is not feeling any different. She says she thinks she is depressed. She is confused about her dand what the medicines are 4 so I spent a good bit of time explaining it to her answering multiple questions. Past Medical History:  Diagnosis Date  . Abdominal pain, chronic, right lower quadrant    "ever since I had my appendix removed"  . Atrial fibrillation ([place])   . Cancer ([place])    left side  . Chronic back pain   . Edema   . GERD (gastroesophageal reflux disease)   . Hyperlipidemia   . Hypertension   . Osteopenia   . Osteoporosis   . Ovarian cyst 02/2015   left  . Recurrent abdominal pain    LLQ    Past Surgical History:   Procedure Laterality Date  . APPENDECTOMY    . APPENDECTOMY    . BREAST SURGERY    . COLON RESECTION    . COLON SURGERY    . HERNIA REPAIR    . HERNIA REPAIR       Current Outpatient Prescriptions  Medication Sig Dispense Refill  . ALPRAZolam (XANAX) 0.5 MG tablet Take 1-2 tablets (0.5-1 mg total) by mouth at bedtime as needed for anxiety. (Patient taking differently: Take 0.25-1 mg by mouth at bedtime as needed for anxiety. ) 30 tablet 5  . amiodarone (PACERONE) 200 MG tablet Take 200mg  Two Times Daily for 1 month, then Change to 200mg  Daily 90 tablet 3  . diltiazem (CARDIZEM CD) 180 MG 24 hr capsule Take 180 mg by mouth daily.    . DULoxetine (CYMBALTA) 20 MG capsule Take 1 capsule (20 mg total) by mouth daily. 30 capsule 3  . furosemide (LASIX) 20 MG tablet Take 1 tablet (20 mg total) by mouth daily. 90 tablet 3  . HYDROcodone-acetaminophen (NORCO/VICODIN) 5-325 MG tablet 1/2 tablet every 6 hours as needed-caution drowsiness 30 tablet 0  . rivaroxaban (XARELTO) 20 MG TABS tablet Take 1 tablet (20 mg total) by mouth daily with supper. 30 tablet 6   No current facility-administered medications for this visit.     Allergies:  Amiodarone; Amoxicillin; Cephalosporins; Hydrocodone; Lipitor [atorvastatin]; Lorazepam; Lovenox [enoxaparin sodium]; Morphine and related; Penicillins; and Sulfa antibiotics    Social History:  The patient  reports that she has never smoked. She has never used smokeless tobacco. She reports that she does not drink alcohol or use drugs.   Family History:  The patient's family history includes Diabetes in her mother; Hyperlipidemia in her brother.    ROS: All other systems are reviewed and negative. Unless otherwise mentioned in H&P    PHYSICAL EXAM: VS:  BP 140/74   Pulse 76   Ht 5\' 4"  (1.626 m)   Wt 160 lb (72.6 kg)   SpO2 93%   BMI 27.46 kg/m  , BMI Body mass index is 27.46 kg/m. GEN: Well nourished, well developed, in no acute distress  HEENT:  normal  Neck: no JVD, carotid bruits, or masses Cardiac: RRR; no murmurs, rubs, or gallops,no edema  Respiratory:  Clear to auscultation bilaterally, normal work of breathing GI: soft, nontender, nondistended, + BS MS: no deformity or atrophy  Skin: warm and dry, no rash Neuro:  Strength and sensation are intact Psych: euthymic mood, full affect   EKG:   The ekg ordered today demonstrates atrial flutter rate of 56 beats per minute   Recent Labs: 05/17/2015: ALT 15 12/12/2015: BUN 17; Creatinine, Ser 0.72; Hemoglobin 13.7; Platelets 244; Potassium 4.3; Sodium 139    Lipid Panel    Component Value Date/Time   CHOL 210 (H) 03/21/2013 0843   TRIG 184 (H) 03/21/2013 0843   HDL 57 03/21/2013 0843   CHOLHDL 3.7 03/21/2013 0843   VLDL 37 03/21/2013 0843   LDLCALC 116 (H) 03/21/2013 0843      Wt Readings from Last 3 Encounters:  01/07/16 160 lb (72.6 kg)  12/17/15 159 lb (72.1 kg)  12/15/15 161 lb (73 kg)      ASSESSMENT AND PLAN:  1.  Atrial flutter: Good rate control on amiodarone 2daily, diltiazem iron 80 mg daily. She is tolerating XARELTO 20 mg daily without complaints of bleeding. Most recent hemoglobin was 13.6 one month ago.I have spent approxthis patient going over her medications, talking to her about atrial flutter, I have also given her literature on this. I have answered multiple questions.she verbalizes understanding.  2. Hypertension: Well controlled. Will not make any changes at this time.  Current medicines are reviewed at length with the patient today.    Labs/ tests ordered today include:   Orders Placed This Encounter  Procedures  . EKG 12-Lead     Disposition:   FU with 3 months unless symptomatic  Signed, Jory Sims, NP  01/07/2016 4:34 PM    [facility] [address] [place], [place] [address] Phone: [phone]; Fax: [phone]

## 2016-01-07 NOTE — Progress Notes (Signed)
Name: [name]    DOB: [birth year]  Age: 82 y.o.  MR#: [id number]       PCP:  Sallee Lange, MD      Insurance: Payor: BLUE [employer] MEDICARE / Plan: BCBS MEDICARE / Product Type: *No Product type* /   CC:   No chief complaint on file.   VS Vitals:   01/07/16 1408  Pulse: 76  SpO2: 93%  Weight: 160 lb (72.6 kg)  Height: 5\' 4"  (1.626 m)    Weights Current Weight  01/07/16 160 lb (72.6 kg)  12/17/15 159 lb (72.1 kg)  12/15/15 161 lb (73 kg)    Blood Pressure  BP Readings from Last 3 Encounters:  12/17/15 (!) 144/72  12/15/15 140/74  12/12/15 193/74     Admit date:  (Not on file) Last encounter with RMR:  Visit date not found   Allergy Amiodarone; Amoxicillin; Cephalosporins; Hydrocodone; Lipitor [atorvastatin]; Lorazepam; Lovenox [enoxaparin sodium]; Morphine and related; Penicillins; and Sulfa antibiotics  Current Outpatient Prescriptions  Medication Sig Dispense Refill  . ALPRAZolam (XANAX) 0.5 MG tablet Take 1-2 tablets (0.5-1 mg total) by mouth at bedtime as needed for anxiety. (Patient taking differently: Take 0.25-1 mg by mouth at bedtime as needed for anxiety. ) 30 tablet 5  . amiodarone (PACERONE) 200 MG tablet Take 200mg  Two Times Daily for 1 month, then Change to 200mg  Daily 90 tablet 3  . diltiazem (CARDIZEM CD) 180 MG 24 hr capsule Take 180 mg by mouth daily.    . DULoxetine (CYMBALTA) 20 MG capsule Take 1 capsule (20 mg total) by mouth daily. 30 capsule 3  . furosemide (LASIX) 20 MG tablet Take 1 tablet (20 mg total) by mouth daily. 90 tablet 3  . HYDROcodone-acetaminophen (NORCO/VICODIN) 5-325 MG tablet 1/2 tablet every 6 hours as needed-caution drowsiness 30 tablet 0  . rivaroxaban (XARELTO) 20 MG TABS tablet Take 1 tablet (20 mg total) by mouth daily with supper. 30 tablet 6   No current facility-administered medications for this visit.     Discontinued Meds:    Medications Discontinued During This Encounter  Medication Reason  . metoprolol  succinate (TOPROL-XL) 50 MG 24 hr tablet Error  . metoprolol tartrate (LOPRESSOR) 25 MG tablet Error    Patient Active Problem List   Diagnosis Date Noted  . Osteoarthritis of right knee 12/15/2015  . Major depression ([place]) 09/22/2015  . Ventral hernia 05/21/2015  . Ovarian tumor 04/13/2015  . Squamous cell skin cancer 08/13/2014  . Impaired fasting glucose 08/05/2013  . Neuropathy ([place]) 01/11/2013  . Long term (current) use of anticoagulants 07/27/2012  . Atrial fibrillation with RVR ([place]) 01/29/2012  . Anxiety 01/29/2012  . GERD (gastroesophageal reflux disease) 01/29/2012  . Chronic anticoagulation 01/29/2012  . Sciatica of right side 05/10/2011    LABS    Component Value Date/Time   NA 139 12/12/2015 1737   NA 145 (H) 07/23/2015 1033   NA 139 05/17/2015 1725   NA 141 04/04/2015 1002   NA 141 03/09/2015 1333   NA 144 08/04/2014 0943   K 4.3 12/12/2015 1737   K 4.6 07/23/2015 1033   K 3.5 05/17/2015 1725   CL 104 12/12/2015 1737   CL 104 07/23/2015 1033   CL 103 05/17/2015 1725   CO2 29 12/12/2015 1737   CO2 23 07/23/2015 1033   CO2 27 05/17/2015 1725   GLUCOSE 111 (H) 12/12/2015 1737   GLUCOSE 104 (H) 07/23/2015 1033   GLUCOSE 153 (H) 05/17/2015 1725  GLUCOSE 136 (H) 04/04/2015 1002   BUN 17 12/12/2015 1737   BUN 10 07/23/2015 1033   BUN 15 05/17/2015 1725   BUN 10 04/04/2015 1002   BUN 15 03/09/2015 1333   BUN 13 08/04/2014 0943   CREATININE 0.72 12/12/2015 1737   CREATININE 0.71 07/23/2015 1033   CREATININE 0.80 05/17/2015 1725   CREATININE 0.64 09/10/2013 0948   CREATININE 0.72 03/21/2013 0843   CREATININE 0.78 12/14/2012 1315   CALCIUM 9.1 12/12/2015 1737   CALCIUM 9.3 07/23/2015 1033   CALCIUM 8.9 05/17/2015 1725   GFRNONAA >60 12/12/2015 1737   GFRNONAA 76 07/23/2015 1033   GFRNONAA >60 05/17/2015 1725   GFRAA >60 12/12/2015 1737   GFRAA 87 07/23/2015 1033   GFRAA >60 05/17/2015 1725   CMP     Component Value Date/Time   NA 139 12/12/2015  1737   NA 145 (H) 07/23/2015 1033   K 4.3 12/12/2015 1737   CL 104 12/12/2015 1737   CO2 29 12/12/2015 1737   GLUCOSE 111 (H) 12/12/2015 1737   BUN 17 12/12/2015 1737   BUN 10 07/23/2015 1033   CREATININE 0.72 12/12/2015 1737   CREATININE 0.64 09/10/2013 0948   CALCIUM 9.1 12/12/2015 1737   PROT 7.1 05/17/2015 1725   PROT 6.8 03/09/2015 1333   ALBUMIN 3.9 05/17/2015 1725   ALBUMIN 4.2 03/09/2015 1333   AST 20 05/17/2015 1725   ALT 15 05/17/2015 1725   ALKPHOS 57 05/17/2015 1725   BILITOT 0.7 05/17/2015 1725   BILITOT 0.5 03/09/2015 1333   GFRNONAA >60 12/12/2015 1737   GFRAA >60 12/12/2015 1737       Component Value Date/Time   WBC 6.7 12/12/2015 1737   WBC 7.8 05/17/2015 1725   WBC 6.8 04/04/2015 1002   HGB 13.7 12/12/2015 1737   HGB 13.3 05/17/2015 1725   HGB 13.8 04/04/2015 1002   HCT 42.1 12/12/2015 1737   HCT 40.4 05/17/2015 1725   HCT 42.4 04/04/2015 1002   HCT 41.0 03/09/2015 1333   MCV 87.5 12/12/2015 1737   MCV 87.6 05/17/2015 1725   MCV 87.4 04/04/2015 1002   MCV 84 03/09/2015 1333    Lipid Panel     Component Value Date/Time   CHOL 210 (H) 03/21/2013 0843   TRIG 184 (H) 03/21/2013 0843   HDL 57 03/21/2013 0843   CHOLHDL 3.7 03/21/2013 0843   VLDL 37 03/21/2013 0843   LDLCALC 116 (H) 03/21/2013 0843    ABG No results found for: PHART, PCO2ART, PO2ART, HCO3, TCO2, ACIDBASEDEF, O2SAT   Lab Results  Component Value Date   TSH 3.660 01/08/2014   BNP (last 3 results) No results for input(s): BNP in the last 8760 hours.  ProBNP (last 3 results) No results for input(s): PROBNP in the last 8760 hours.  Cardiac Panel (last 3 results) No results for input(s): CKTOTAL, CKMB, TROPONINI, RELINDX in the last 72 hours.  Iron/TIBC/Ferritin/ %Sat No results found for: IRON, TIBC, FERRITIN, IRONPCTSAT   EKG Orders placed or performed in visit on 12/17/15  . EKG 12-Lead     Prior Assessment and Plan Problem List as of 01/07/2016 Reviewed: 12/19/2015   7:15 PM by Carlyle Dolly, MD     Cardiovascular and Mediastinum   Atrial fibrillation with RVR ([place])     Digestive   GERD (gastroesophageal reflux disease)     Endocrine   Impaired fasting glucose     Nervous and Auditory   Sciatica of right side   Neuropathy ([place])  Musculoskeletal and Integument   Squamous cell skin cancer   Osteoarthritis of right knee     Genitourinary   Ovarian tumor     Other   Anxiety   Chronic anticoagulation   Long term (current) use of anticoagulants   Ventral hernia   Major depression ([place])       Imaging: Dg Chest 2 View  Result Date: 12/12/2015 CLINICAL DATA:  Patient with elevated blood pressure. Generalized weakness. EXAM: CHEST  2 VIEW COMPARISON:  Chest radiograph 05/17/2015. FINDINGS: Monitoring leads overlie the patient. Stable enlarged cardiac and mediastinal contours with tortuosity of the thoracic aorta. No consolidative pulmonary opacities. No pleural effusion or pneumothorax. Thoracic spine degenerative changes. IMPRESSION: No acute cardiopulmonary process. Electronically Signed   By: Lovey Newcomer M.D.   On: 12/12/2015 18:57   Dg Knee Complete 4 Views Right  Result Date: 12/12/2015 CLINICAL DATA:  Lateral right knee pain 1 week. No recent injury. Fall 3 months ago. EXAM: RIGHT KNEE - COMPLETE 4+ VIEW COMPARISON:  04/25/2011 FINDINGS: There is diffuse decreased bone mineralization. There are mild tricompartmental osteoarthritic changes. No acute fracture or dislocation. No joint effusion. IMPRESSION: No acute findings. Osteopenia with mild tricompartmental osteoarthritis. Electronically Signed   By: Marin Olp M.D.   On: 12/12/2015 19:03

## 2016-01-07 NOTE — Patient Instructions (Signed)
Medication Instructions:  Your physician recommends that you continue on your current medications as directed. Please refer to the Current Medication list given to you today.   Labwork: NONE  Testing/Procedures: NONE  Follow-Up: Your physician recommends that you schedule a follow-up appointment in: 3 MONTHS    Any Other Special Instructions Will Be Listed Below (If Applicable).     If you need a refill on your cardiac medications before your next appointment, please call your pharmacy.   

## 2016-01-12 ENCOUNTER — Ambulatory Visit (INDEPENDENT_AMBULATORY_CARE_PROVIDER_SITE_OTHER): Payer: Medicare Other | Admitting: Family Medicine

## 2016-01-12 ENCOUNTER — Encounter: Payer: Self-pay | Admitting: Family Medicine

## 2016-01-12 VITALS — BP 148/80 | Ht 64.0 in | Wt 159.0 lb

## 2016-01-12 DIAGNOSIS — I482 Chronic atrial fibrillation, unspecified: Secondary | ICD-10-CM

## 2016-01-12 DIAGNOSIS — F320 Major depressive disorder, single episode, mild: Secondary | ICD-10-CM

## 2016-01-12 DIAGNOSIS — I4891 Unspecified atrial fibrillation: Secondary | ICD-10-CM | POA: Insufficient documentation

## 2016-01-12 MED ORDER — DULOXETINE HCL 20 MG PO CPEP: ORAL_CAPSULE | ORAL | 5 refills | Status: DC

## 2016-01-12 NOTE — Progress Notes (Signed)
   Subjective:    Patient ID: [name], female    DOB: 1923-10-16, 82 y.o.   MRN: [id number]  HPIFollow up on depression. Taking cymbalta.   Pt states no concerns besides her back pain and she states its arthritis and nothing can be done for it. Still has hydrocodne at home. Does not need a refill. Takes one half tablet because she does not want to get dependant on it.   She does have a fair amount of pain in her mid back as well as her knees she feels this is related to getting older it does not wake her up just causes arthralgias. She denies any chest tightness pressure pain or shortness of breath currently Patient denies any suicidal ideation no homicidal ideation overall she states she tries to do the best he can stain positive   Review of Systems See above denies any bleeding issues    Objective:   Physical Exam Irregular heart yet rhythm under control lungs clear extremities no edema  Patient states that time she gets a sharp pain in her lower abdomen     Assessment & Plan:  Depression increase the dose and medicine follow-up again in 3-4 months time  Osteoarthritis of her knees hopefully increasing Cymbalta will help her with her discomfort.  Atrial fibrillation under decent control currently

## 2016-01-25 ENCOUNTER — Emergency Department (HOSPITAL_COMMUNITY)
Admission: EM | Admit: 2016-01-25 | Discharge: 2016-01-25 | Disposition: A | Discharge status: Home / Self Care | Payer: Medicare Other | Attending: Emergency Medicine | Admitting: Emergency Medicine

## 2016-01-25 ENCOUNTER — Encounter (HOSPITAL_COMMUNITY): Payer: Self-pay | Admitting: Emergency Medicine

## 2016-01-25 ENCOUNTER — Emergency Department (HOSPITAL_COMMUNITY): Payer: Medicare Other

## 2016-01-25 DIAGNOSIS — K5900 Constipation, unspecified: Secondary | ICD-10-CM | POA: Insufficient documentation

## 2016-01-25 DIAGNOSIS — N390 Urinary tract infection, site not specified: Secondary | ICD-10-CM

## 2016-01-25 DIAGNOSIS — I10 Essential (primary) hypertension: Secondary | ICD-10-CM | POA: Insufficient documentation

## 2016-01-25 DIAGNOSIS — R1031 Right lower quadrant pain: Secondary | ICD-10-CM | POA: Diagnosis not present

## 2016-01-25 DIAGNOSIS — Z79899 Other long term (current) drug therapy: Secondary | ICD-10-CM | POA: Insufficient documentation

## 2016-01-25 LAB — URINALYSIS, ROUTINE W REFLEX MICROSCOPIC
Bilirubin Urine: NEGATIVE
GLUCOSE, UA: NEGATIVE mg/dL
HGB URINE DIPSTICK: NEGATIVE
Nitrite: NEGATIVE
PH: 6 (ref 5.0–8.0)
PROTEIN: NEGATIVE mg/dL
Specific Gravity, Urine: 1.01 (ref 1.005–1.030)

## 2016-01-25 LAB — CBC WITH DIFFERENTIAL/PLATELET
BASOS ABS: 0 10*3/uL (ref 0.0–0.1)
BASOS PCT: 0 %
EOS PCT: 0 %
Eosinophils Absolute: 0 10*3/uL (ref 0.0–0.7)
HCT: 38.8 % (ref 36.0–46.0)
Hemoglobin: 13 g/dL (ref 12.0–15.0)
LYMPHS PCT: 22 %
Lymphs Abs: 2 10*3/uL (ref 0.7–4.0)
MCH: 28.8 pg (ref 26.0–34.0)
MCHC: 33.5 g/dL (ref 30.0–36.0)
MCV: 85.8 fL (ref 78.0–100.0)
MONO ABS: 0.7 10*3/uL (ref 0.1–1.0)
MONOS PCT: 7 %
Neutro Abs: 6.4 10*3/uL (ref 1.7–7.7)
Neutrophils Relative %: 71 %
PLATELETS: 236 10*3/uL (ref 150–400)
RBC: 4.52 MIL/uL (ref 3.87–5.11)
RDW: 13.5 % (ref 11.5–15.5)
WBC: 9.2 10*3/uL (ref 4.0–10.5)

## 2016-01-25 LAB — COMPREHENSIVE METABOLIC PANEL
ALBUMIN: 4.1 g/dL (ref 3.5–5.0)
ALK PHOS: 55 U/L (ref 38–126)
ALT: 20 U/L (ref 14–54)
AST: 24 U/L (ref 15–41)
Anion gap: 11 (ref 5–15)
BILIRUBIN TOTAL: 0.6 mg/dL (ref 0.3–1.2)
BUN: 23 mg/dL — AB (ref 6–20)
CALCIUM: 9.1 mg/dL (ref 8.9–10.3)
CO2: 25 mmol/L (ref 22–32)
Chloride: 101 mmol/L (ref 101–111)
Creatinine, Ser: 0.82 mg/dL (ref 0.44–1.00)
GFR calc Af Amer: >60 mL/min (ref >60)
GFR calc non Af Amer: >60 mL/min (ref >60)
GLUCOSE: 118 mg/dL — AB (ref 65–99)
Potassium: 3.3 mmol/L — ABNORMAL LOW (ref 3.5–5.1)
Sodium: 137 mmol/L (ref 135–145)
TOTAL PROTEIN: 7 g/dL (ref 6.5–8.1)

## 2016-01-25 LAB — I-STAT CG4 LACTIC ACID, ED: Lactic Acid, Venous: 1.11 mmol/L (ref 0.5–1.9)

## 2016-01-25 LAB — URINE MICROSCOPIC-ADD ON: RBC / HPF: NONE SEEN RBC/hpf (ref 0–5)

## 2016-01-25 LAB — LIPASE, BLOOD: LIPASE: 28 U/L (ref 11–51)

## 2016-01-25 MED ORDER — SODIUM CHLORIDE 0.9 % IV SOLN
Freq: Once | INTRAVENOUS | Status: AC
  Administered 2016-01-25: 12:00:00 via INTRAVENOUS

## 2016-01-25 MED ORDER — CIPROFLOXACIN HCL 500 MG PO TABS: 500.0000 mg | ORAL_TABLET | Freq: Two times a day (BID) | ORAL | 0 refills | Status: DC

## 2016-01-25 MED ORDER — ONDANSETRON HCL 4 MG/2ML IJ SOLN
4.0000 mg | Freq: Once | INTRAMUSCULAR | Status: AC
  Administered 2016-01-25: 4 mg via INTRAVENOUS
  Filled 2016-01-25: qty 2

## 2016-01-25 MED ORDER — CIPROFLOXACIN HCL 250 MG PO TABS
500.0000 mg | ORAL_TABLET | Freq: Once | ORAL | Status: AC
  Administered 2016-01-25: 500 mg via ORAL
  Filled 2016-01-25: qty 2

## 2016-01-25 MED ORDER — IOPAMIDOL (ISOVUE-300) INJECTION 61%
100.0000 mL | Freq: Once | INTRAVENOUS | Status: AC | PRN
  Administered 2016-01-25: 100 mL via INTRAVENOUS

## 2016-01-25 MED ORDER — IOPAMIDOL (ISOVUE-M 200) INJECTION 41%
INTRAMUSCULAR | Status: AC
  Filled 2016-01-25: qty 10

## 2016-01-25 MED ORDER — FENTANYL CITRATE (PF) 100 MCG/2ML IJ SOLN
50.0000 ug | INTRAMUSCULAR | Status: DC | PRN
  Administered 2016-01-25: 50 ug via INTRAVENOUS
  Filled 2016-01-25: qty 2

## 2016-01-25 NOTE — ED Provider Notes (Signed)
[facility] DEPT Provider Note   CSN: [id number] Arrival date & time: 01/25/16  1118  By signing my name below, I, Hansel Feinstein, attest that this documentation has been prepared under the direction and in the presence of Noemi Chapel, MD. Electronically Signed: Hansel Feinstein, ED Scribe. 01/25/16. 11:58 AM.     History   Chief Complaint Chief Complaint  Patient presents with  . Abdominal Pain    HPI Jerry DAWNIEL LASICH is a 82 y.o. female with h/o appendectomy, colon resection, 3 hernia repairs, chronic abdominal pain who presents to the Emergency Department complaining of moderate RLQ abdominal pain with radiation to the back onset 3 days ago. Pt states associated constipation, abdominal distension, chills and nausea. Pt states she has still been experiencing flatulence. She reports she has taken Hydrocodone PTA with no relief of pain. She states her last BM was 3 days ago. She reports she normally has BMs twice a day at baseline. Pt states she has  previously had similar pain in the RLQ prior to appendectomy and in the LLQ. She denies emesis, bloody stools, fever, dysuria, frequency, urgency.     The history is provided by the patient. No language interpreter was used.    Past Medical History:  Diagnosis Date  . Abdominal pain, chronic, right lower quadrant    "ever since I had my appendix removed"  . Atrial fibrillation ([place])   . Cancer ([place])    left side  . Chronic back pain   . Edema   . GERD (gastroesophageal reflux disease)   . Hyperlipidemia   . Hypertension   . Osteopenia   . Osteoporosis   . Ovarian cyst 02/2015   left  . Recurrent abdominal pain    LLQ    Patient Active Problem List   Diagnosis Date Noted  . Atrial fibrillation ([place]) 01/12/2016  . Osteoarthritis of right knee 12/15/2015  . Major depression ([place]) 09/22/2015  . Ventral hernia 05/21/2015  . Ovarian tumor 04/13/2015  . Squamous cell skin cancer 08/13/2014  . Impaired fasting glucose 08/05/2013  .  Neuropathy ([place]) 01/11/2013  . Long term (current) use of anticoagulants 07/27/2012  . Atrial fibrillation with RVR ([place]) 01/29/2012  . Anxiety 01/29/2012  . GERD (gastroesophageal reflux disease) 01/29/2012  . Chronic anticoagulation 01/29/2012  . Sciatica of right side 05/10/2011    Past Surgical History:  Procedure Laterality Date  . APPENDECTOMY    . APPENDECTOMY    . BREAST SURGERY    . COLON RESECTION    . COLON SURGERY    . HERNIA REPAIR    . HERNIA REPAIR      OB History    Gravida Para Term Preterm AB Living   2 1     1 1    SAB TAB Ectopic Multiple Live Births   1               Home Medications    Prior to Admission medications   Medication Sig Start Date End Date Taking? Authorizing Provider  ALPRAZolam Duanne Moron) 0.5 MG tablet Take 1-2 tablets (0.5-1 mg total) by mouth at bedtime as needed for anxiety. Patient taking differently: Take 0.25-1 mg by mouth at bedtime as needed for anxiety.  09/22/15  Yes [name], MD  amiodarone (PACERONE) 200 MG tablet Take 200mg  Two Times Daily for 1 month, then Change to 200mg  Daily 11/11/15  Yes Arnoldo Lenis, MD  diltiazem (CARDIZEM CD) 180 MG 24 hr capsule Take 180 mg by mouth  daily.   Yes Historical Provider, MD  DULoxetine (CYMBALTA) 20 MG capsule Take two every day 01/12/16  Yes [name], MD  HYDROcodone-acetaminophen (NORCO/VICODIN) 5-325 MG tablet 1/2 tablet every 6 hours as needed-caution drowsiness 12/15/15  Yes [name], MD  rivaroxaban (XARELTO) 20 MG TABS tablet Take 1 tablet (20 mg total) by mouth daily with supper. 07/28/15  Yes Arnoldo Lenis, MD  ciprofloxacin (CIPRO) 500 MG tablet Take 1 tablet (500 mg total) by mouth 2 (two) times daily. 01/25/16   Noemi Chapel, MD  furosemide (LASIX) 20 MG tablet Take 1 tablet (20 mg total) by mouth daily. Patient not taking: Reported on 01/25/2016 11/09/15   Arnoldo Lenis, MD    Family History Family History  Problem Relation Age of Onset  . Diabetes Mother    . Heart disease    . Arthritis    . Cancer    . Hyperlipidemia Brother     Social History Social History  Substance Use Topics  . Smoking status: Never Smoker  . Smokeless tobacco: Never Used  . Alcohol use No     Allergies   Amiodarone; Amoxicillin; Cephalosporins; Hydrocodone; Lipitor [atorvastatin]; Lorazepam; Lovenox [enoxaparin sodium]; Morphine and related; Penicillins; and Sulfa antibiotics   Review of Systems Review of Systems  Constitutional: Positive for chills. Negative for fever.  Gastrointestinal: Positive for abdominal distention, abdominal pain, constipation and nausea. Negative for blood in stool and vomiting.  Genitourinary: Negative for dysuria, frequency and urgency.  Musculoskeletal: Positive for back pain.  All other systems reviewed and are negative.   Physical Exam Updated Vital Signs BP 157/75   Pulse 71   Temp 98.3 F (36.8 C) (Oral)   Resp 18   Ht 5\' 4"  (1.626 m)   Wt 152 lb (68.9 kg)   SpO2 94%   BMI 26.09 kg/m   Physical Exam  Constitutional: She appears well-developed and well-nourished. No distress.  HENT:  Head: Normocephalic and atraumatic.  Mouth/Throat: Oropharynx is clear and moist. No oropharyngeal exudate.  Eyes: Conjunctivae and EOM are normal. Pupils are equal, round, and reactive to light. Right eye exhibits no discharge. Left eye exhibits no discharge. No scleral icterus.  Neck: Normal range of motion. Neck supple. No JVD present. No thyromegaly present.  Cardiovascular: Normal rate, regular rhythm, normal heart sounds and intact distal pulses.  Exam reveals no gallop and no friction rub.   No murmur heard. Pulmonary/Chest: Effort normal and breath sounds normal. No respiratory distress. She has no wheezes. She has no rales.  Abdominal: Soft. She exhibits no distension and no mass. There is tenderness. There is no guarding.  Bowel sounds decreased. Right sided fullness and tenderness. No masses. No guarding or peritoneal  sounds.   Musculoskeletal: Normal range of motion. She exhibits no edema or tenderness.  Lymphadenopathy:    She has no cervical adenopathy.  Neurological: She is alert. Coordination normal.  Skin: Skin is warm and dry. No rash noted. No erythema.  Psychiatric: She has a normal mood and affect. Her behavior is normal.  Nursing note and vitals reviewed.    ED Treatments / Results   DIAGNOSTIC STUDIES: Oxygen Saturation is 96% on RA, adequate by my interpretation.    COORDINATION OF CARE: 11:50 AM Discussed treatment plan with pt at bedside which includes lab work and pt agreed to plan.    Labs (all labs ordered are listed, but only abnormal results are displayed) Labs Reviewed  COMPREHENSIVE METABOLIC PANEL - Abnormal; Notable for  the following:       Result Value   Potassium 3.3 (*)    Glucose, Bld 118 (*)    BUN 23 (*)    All other components within normal limits  URINALYSIS, ROUTINE W REFLEX MICROSCOPIC (NOT AT [facility]) - Abnormal; Notable for the following:    Ketones, ur TRACE (*)    Leukocytes, UA SMALL (*)    All other components within normal limits  URINE MICROSCOPIC-ADD ON - Abnormal; Notable for the following:    Squamous Epithelial / LPF 0-5 (*)    Bacteria, UA MANY (*)    All other components within normal limits  URINE CULTURE  LIPASE, BLOOD  CBC WITH DIFFERENTIAL/PLATELET  I-STAT CG4 LACTIC ACID, ED    EKG  EKG Interpretation None       Radiology Ct Abdomen Pelvis W Contrast  Result Date: 01/25/2016 CLINICAL DATA:  82 year old female with a history of right lower quadrant pain radiating to the back EXAM: CT ABDOMEN AND PELVIS WITH CONTRAST TECHNIQUE: Multidetector CT imaging of the abdomen and pelvis was performed using the standard protocol following bolus administration of intravenous contrast. CONTRAST:  173mL ISOVUE-300 IOPAMIDOL (ISOVUE-300) INJECTION 61% COMPARISON:  05/17/2015, 04/04/2015 FINDINGS: Lower chest: Surgical changes of the left breast.  Heart size within normal limits.  No pericardial fluid/thickening. Calcifications in the distribution of the coronary arteries. No lower mediastinal adenopathy. Unremarkable appearance of the distal esophagus. Hiatal hernia Compare to the prior CT, there has been development of bronchial wall thickening and airspace opacity of the left lower lobe. Atelectasis/ scarring in the dependent aspects of the right lower lobe. No pneumothorax or pleural effusion visualized. Abdomen/pelvis: Unremarkable appearance of liver and spleen. Unremarkable appearance of bilateral adrenal glands. No peripancreatic or pericholecystic fluid or inflammatory changes. No radio-opaque gallstones. No intrahepatic or extrahepatic biliary ductal dilatation. No intra-peritoneal free air or significant free-fluid. No abnormally dilated small bowel or colon. No transition point. Duodenum diverticulum. No inflammatory changes of the mesenteries. Diverticular disease. No inflammatory changes to indicate acute diverticulitis. Appendix is not visualized, however, no inflammatory changes are present adjacent to the cecum to indicate an appendicitis. Similar appearance of ventral wall hernia just above the pubic symphysis. The hernia sac contains a short segment of small bowel. No evidence of obstruction. Right Kidney/Ureter: No hydronephrosis. No nephrolithiasis. No perinephric stranding. Unremarkable course of the right ureter. Left Kidney/Ureter: No hydronephrosis. No nephrolithiasis. No perinephric stranding. Unremarkable course of the left ureter. Unremarkable appearance of the urinary bladder. Unremarkable appearance of uterus. Low-density lobulated cystic structure associated with the left adnexa is again evident. Lesion measures slightly larger than the comparison CT. Prior measured 4.3 cm x 2.7 cm. Current measures 4.4 cm x 3.0 cm. This has been study with ultrasound in the past, most recent 04/08/2015. Calcifications of the abdominal aorta. No  aneurysm. No periaortic fluid. No dissection flap. Musculoskeletal: No displaced fracture. Multilevel degenerative changes of the spine. IMPRESSION: No acute finding to account for the patient's symptoms of right-sided flank pain. New airspace disease at the base of the left lung concerning for infection/inflammation. Recommend correlation with lab values and patient presentation. Re- demonstration of ventral wall hernia just above the pubic symphysis containing a short segment of small bowel. No evidence of obstruction. Re- demonstration of left adnexal cyst, though there appears to have been interval growth. Presumably the patient is followed by OB/GYN for this adnexal cyst which could represent low-grade malignancy. If the patient has not yet been evaluated by Ob/GYN service,  referral is recommended to initiate surveillance. Aortic atherosclerosis. Signed, Dulcy Fanny. Earleen Newport, DO [facility] Electronically Signed   By: Corrie Mckusick D.O.   On: 01/25/2016 13:53    Procedures Procedures (including critical care time)  Medications Ordered in ED Medications  fentaNYL (SUBLIMAZE) injection 50 mcg (50 mcg Intravenous Given 01/25/16 1211)  iopamidol (ISOVUE-M) 41 % intrathecal injection (not administered)  ciprofloxacin (CIPRO) tablet 500 mg (not administered)  0.9 %  sodium chloride infusion ( Intravenous Stopped 01/25/16 1400)  ondansetron (ZOFRAN) injection 4 mg (4 mg Intravenous Given 01/25/16 1210)  iopamidol (ISOVUE-300) 61 % injection 100 mL (100 mLs Intravenous Contrast Given 01/25/16 1331)     Initial Impression / Assessment and Plan / ED Course  I have reviewed the triage vital signs and the nursing notes.  Pertinent labs & imaging results that were available during my care of the patient were reviewed by me and considered in my medical decision making (see chart for details).  Clinical Course  Comment By Time  UTI present, other labs  unremarkable - antibiotics given prior to d/c. Noemi Chapel, MD 09/04 1425   No findings of surgical pathology on CT, pt informed  Final Clinical Impressions(s) / ED Diagnoses   Final diagnoses:  UTI (lower urinary tract infection)  Right lower quadrant abdominal pain    New Prescriptions New Prescriptions   CIPROFLOXACIN (CIPRO) 500 MG TABLET    Take 1 tablet (500 mg total) by mouth 2 (two) times daily.    I personally performed the services described in this documentation, which was scribed in my presence. The recorded information has been reviewed and is accurate.      Noemi Chapel, MD 01/25/16 [phone]

## 2016-01-25 NOTE — ED Triage Notes (Signed)
Pt reports onset of RLQ/groin pain with radiation into her low back. Onset of symptoms 3 days ago. Pt reports difficulty with constipation which is new for her.

## 2016-01-25 NOTE — ED Notes (Signed)
Pt cousin Vaughan Basta called to provide d/c transportation.

## 2016-01-25 NOTE — Discharge Instructions (Signed)

## 2016-01-25 NOTE — ED Notes (Signed)
Vaughan Basta (cousin) [phone]

## 2016-01-26 ENCOUNTER — Telehealth: Payer: Self-pay | Admitting: Family Medicine

## 2016-01-26 NOTE — Telephone Encounter (Signed)
Pt having right side abd pain goes to mid back for 5 days. Went to aph ed yesterday. Diagnosed with uti. Pt states she does not believe it is an uti. She states it feels the same way as when skin was growing up over the mesh used for her colon repair. Pt wants something called in for constipation. Last bm 5 days ago and is was normal. Not hard.

## 2016-01-26 NOTE — Telephone Encounter (Signed)
Discussed with pt

## 2016-01-26 NOTE — Telephone Encounter (Signed)
Pt is needing something called in for constipation.        [name]

## 2016-01-26 NOTE — Telephone Encounter (Signed)
She may use over-the-counter MiraLAX one capful of powder in 8 ounces water daily with the goal of having a soft bowel movement at least every few days. If ongoing trouble she needs to be followed up. Certainly if worse also needs to be followed up.

## 2016-01-27 LAB — URINE CULTURE

## 2016-01-28 ENCOUNTER — Ambulatory Visit (INDEPENDENT_AMBULATORY_CARE_PROVIDER_SITE_OTHER): Payer: Medicare Other | Admitting: Family Medicine

## 2016-01-28 ENCOUNTER — Encounter: Payer: Self-pay | Admitting: Family Medicine

## 2016-01-28 VITALS — BP 110/72 | Ht 64.0 in | Wt 160.0 lb

## 2016-01-28 DIAGNOSIS — R1031 Right lower quadrant pain: Secondary | ICD-10-CM

## 2016-01-28 DIAGNOSIS — M545 Low back pain, unspecified: Secondary | ICD-10-CM

## 2016-01-28 DIAGNOSIS — K5909 Other constipation: Secondary | ICD-10-CM

## 2016-01-28 NOTE — Progress Notes (Signed)
   Subjective:    Patient ID: [name], female    DOB: Sep 30, 1923, 82 y.o.   MRN: [id number]  HPI Patient arrives with c/o back pain and constipation ER notes were reviewed including labs patient relates severe constipation over the past 5-6 days has been taking hydrocodone patient also relates low back and side pain when she stands up and walks not as bad when she sitting denies true abdominal pain. Denies rectal bleeding.  Review of Systems Patient denies rectal bleeding denies vomiting diarrhea relates moderate constipation. Relates low back pain    Objective:   Physical Exam  Lungs clear heart rate control pulse normal abdomen soft no guarding rebound low back subjective tenderness right lower back extremities no edema      Assessment & Plan:  Lumbar pain I believe this is causing the majority of her discomfort I recommend that she do x-rays of lumbar spine follow-up here within 4 weeks. Tylenol as needed for pain minimize use of hydrocodone  Constipation importance of using stool softeners MiraLAX 2 clocks if necessary follow-up in one week to see how this is doing. Recheck sooner problems  I believe constipation due to use of hydrocodone I doubt she has colon cancer CT scan looked good I don't recommend any type of colonoscopy at this point recheck in one week

## 2016-02-01 ENCOUNTER — Telehealth: Payer: Self-pay | Admitting: Family Medicine

## 2016-02-01 NOTE — Telephone Encounter (Signed)
Notified patient more than likely this is a virus. Patient already on Cipro just recently so therefore the chance of a bacterial sore throat is low. Warm salt water gargles would be the best approach currently, if worse over the next 4 or 5 days let us know. If patient sees evidence of thrush let us know. Patient verbalized understanding.

## 2016-02-01 NOTE — Telephone Encounter (Signed)
Patient states that she has a sore throat and hoarse voice. No fever, sob, wheezing, congestion or coughing.

## 2016-02-01 NOTE — Telephone Encounter (Signed)
Patent seen on 01/28/16 and she wanted to let Dr. Nicki Reaper know that her constipation is no longer a problem.  She says now she has a sore throat and since she was seen so recent, she wants to know if we can send in something for her?  Larene Pickett

## 2016-02-01 NOTE — Telephone Encounter (Signed)
More than likely this is a virus. Patient already on Cipro just recently so therefore the chance of a bacterial sore throat is low. Warm salt water gargles would be the best approach currently, if worse over the next 4 or 5 days let us know. If patient sees evidence of thrush let us know.

## 2016-02-03 ENCOUNTER — Ambulatory Visit (HOSPITAL_COMMUNITY)
Admission: RE | Admit: 2016-02-03 | Discharge: 2016-02-03 | Disposition: A | Discharge status: Home / Self Care | Payer: Medicare Other | Source: Ambulatory Visit | Attending: Family Medicine | Admitting: Family Medicine

## 2016-02-03 DIAGNOSIS — M5136 Other intervertebral disc degeneration, lumbar region: Secondary | ICD-10-CM | POA: Diagnosis not present

## 2016-02-03 DIAGNOSIS — M545 Low back pain: Secondary | ICD-10-CM | POA: Diagnosis not present

## 2016-02-03 DIAGNOSIS — I700 Atherosclerosis of aorta: Secondary | ICD-10-CM | POA: Insufficient documentation

## 2016-02-04 ENCOUNTER — Ambulatory Visit (INDEPENDENT_AMBULATORY_CARE_PROVIDER_SITE_OTHER): Payer: Medicare Other | Admitting: Family Medicine

## 2016-02-04 ENCOUNTER — Encounter: Payer: Self-pay | Admitting: Family Medicine

## 2016-02-04 VITALS — BP 128/70 | Temp 99.0°F | Ht 64.0 in | Wt 160.0 lb

## 2016-02-04 DIAGNOSIS — J209 Acute bronchitis, unspecified: Secondary | ICD-10-CM | POA: Diagnosis not present

## 2016-02-04 DIAGNOSIS — R509 Fever, unspecified: Secondary | ICD-10-CM | POA: Diagnosis not present

## 2016-02-04 DIAGNOSIS — M545 Low back pain, unspecified: Secondary | ICD-10-CM

## 2016-02-04 MED ORDER — DOXYCYCLINE HYCLATE 100 MG PO TABS: 100.0000 mg | ORAL_TABLET | Freq: Two times a day (BID) | ORAL | 0 refills | Status: DC

## 2016-02-04 NOTE — Progress Notes (Signed)
   Subjective:    Patient ID: [name], female    DOB: 1923-11-14, 82 y.o.   MRN: [id number]  HPIFollow up on back pain. Pain is some better.   Cough, low grade fever. Started a few days ago.  Patient with significant chest congestion coughing bringing up some phlegm denies vomiting diarrhea. States back started to feel little bit better she is learning to live with it using Tylenol. Constipation is resolved  Review of Systems  Constitutional: Negative for activity change and fever.  HENT: Positive for congestion and rhinorrhea. Negative for ear pain.   Eyes: Negative for discharge.  Respiratory: Positive for cough. Negative for shortness of breath and wheezing.   Cardiovascular: Negative for chest pain.       Objective:   Physical Exam  Constitutional: She appears well-developed.  HENT:  Head: Normocephalic.  Nose: Nose normal.  Mouth/Throat: Oropharynx is clear and moist. No oropharyngeal exudate.  Neck: Neck supple.  Cardiovascular: Normal rate and normal heart sounds.   No murmur heard. Pulmonary/Chest: Effort normal and breath sounds normal. She has no wheezes.  Lymphadenopathy:    She has no cervical adenopathy.  Skin: Skin is warm and dry.  Nursing note and vitals reviewed.  Chest congestion noted on respiratory exam but I don't find evidence of crackles       Assessment & Plan:  Back pain-I don't find evidence of any serious underlying issue currently x-rays degenerative changes only. Using Tylenol currently staying with him hydrocodone  Significant bronchitis with chest congestion she is at risk of turning into pneumonia I recommend doxycycline twice a day for 10 days warning signs discuss-patient was instructed that she gets worse follow-up sooner otherwise keep appointment in October

## 2016-03-16 ENCOUNTER — Ambulatory Visit: Payer: Medicare Other | Admitting: Family Medicine

## 2016-03-17 ENCOUNTER — Ambulatory Visit (INDEPENDENT_AMBULATORY_CARE_PROVIDER_SITE_OTHER): Payer: Medicare Other | Admitting: Family Medicine

## 2016-03-17 ENCOUNTER — Encounter: Payer: Self-pay | Admitting: Family Medicine

## 2016-03-17 VITALS — BP 104/72 | Ht 64.0 in | Wt 162.1 lb

## 2016-03-17 DIAGNOSIS — Z23 Encounter for immunization: Secondary | ICD-10-CM | POA: Diagnosis not present

## 2016-03-17 DIAGNOSIS — I482 Chronic atrial fibrillation, unspecified: Secondary | ICD-10-CM

## 2016-03-17 DIAGNOSIS — F324 Major depressive disorder, single episode, in partial remission: Secondary | ICD-10-CM | POA: Diagnosis not present

## 2016-03-17 NOTE — Progress Notes (Signed)
   Subjective:    Patient ID: [name], female    DOB: [birth year], 82 y.o.   MRN: [id number]  Depression         This is a new problem.  The current episode started more than 1 month ago.  Her depressions been doing better. The medication is helping. Her moods are doing better. She's tolerating the medicine well. She denies any side effects. Not suicidal or wanting to hurt herself or others. Patient in today for a 3 month follow up on Depression. Patient was prescribed Cymbalta at last office visit.    Review of Systems  Psychiatric/Behavioral: Positive for depression.     Objective:   Physical Exam  lungs clear hearts irregular but the rate is controlled HEENT benign    she denies any chest tightness pressure pain she does relate knee pain back pain denies bleeding issues      Assessment & Plan:  Depression-doing well on current medications. More than likely will maintain these medications for at least 4-6 months  Blood pressure overall doing well today  Follow-up in 4 months time.

## 2016-03-23 ENCOUNTER — Ambulatory Visit (INDEPENDENT_AMBULATORY_CARE_PROVIDER_SITE_OTHER): Payer: Medicare Other | Admitting: Cardiology

## 2016-03-23 ENCOUNTER — Encounter: Payer: Self-pay | Admitting: Cardiology

## 2016-03-23 VITALS — BP 140/80 | HR 77 | Ht 64.0 in | Wt 161.0 lb

## 2016-03-23 DIAGNOSIS — R600 Localized edema: Secondary | ICD-10-CM | POA: Diagnosis not present

## 2016-03-23 DIAGNOSIS — I4891 Unspecified atrial fibrillation: Secondary | ICD-10-CM | POA: Diagnosis not present

## 2016-03-23 DIAGNOSIS — I10 Essential (primary) hypertension: Secondary | ICD-10-CM | POA: Diagnosis not present

## 2016-03-23 NOTE — Progress Notes (Signed)
Clinical Summary [name] is a 82 y.o.female seen today for follow up of the following medical problems.   1. Afib/Aflutter - metoprolol makes her tired, higher dose made her more fatigued and did not improve her palpitatins.  -  changed to dilttiazem due to ongoing palpitations with increased beta blocker dose and fatigue.  - cardizem caused leg swelling and burning on the bottom of her feet. She restarted lopressor and stopped dilt - due to side effects and ineffectiveness of AV nodal agents started amiodarone.    No recent palpitations.Can have some fatigue and dizziness, feeling of being off balance.     2. LE edema - noted recently, bilateral leg swelling. No significant SOB or DOE - minimal salt in diet.  - has been on lasix  - overall controlled   SH: her sister is also a patient of mine, [name]  Past Medical History:  Diagnosis Date  . Abdominal pain, chronic, right lower quadrant    "ever since I had my appendix removed"  . Atrial fibrillation ([place])   . Cancer ([place])    left side  . Chronic back pain   . Edema   . GERD (gastroesophageal reflux disease)   . Hyperlipidemia   . Hypertension   . Osteopenia   . Osteoporosis   . Ovarian cyst 02/2015   left  . Recurrent abdominal pain    LLQ     Allergies  Allergen Reactions  . Amiodarone     itching  . Amoxicillin Hives  . Cephalosporins Itching  . Hydrocodone Itching and Nausea Only    In large doses Made her feel like she was in somebody else's house when getting up at night after taking it  . Lipitor [Atorvastatin] Other (See Comments)    unknown  . Lorazepam Other (See Comments)    Exceptional fatigue   . Lovenox [Enoxaparin Sodium] Nausea And Vomiting  . Morphine And Related Itching  . Penicillins Swelling    Has patient had a PCN reaction causing immediate rash, facial/tongue/throat swelling, SOB or lightheadedness with hypotension: unknown Has patient had a PCN reaction  causing severe rash involving mucus membranes or skin necrosis: unknown Has patient had a PCN reaction that required hospitalization No Has patient had a PCN reaction occurring within the last 10 years: no If all of the above answers are "NO", then may proceed with Cephalosporin use.   . Sulfa Antibiotics Swelling     Current Outpatient Prescriptions  Medication Sig Dispense Refill  . ALPRAZolam (XANAX) 0.5 MG tablet Take 1-2 tablets (0.5-1 mg total) by mouth at bedtime as needed for anxiety. (Patient taking differently: Take 0.25-1 mg by mouth at bedtime as needed for anxiety. ) 30 tablet 5  . amiodarone (PACERONE) 200 MG tablet Take 200mg  Two Times Daily for 1 month, then Change to 200mg  Daily 90 tablet 3  . diltiazem (CARDIZEM CD) 180 MG 24 hr capsule Take 180 mg by mouth daily.    . DULoxetine (CYMBALTA) 20 MG capsule Take two every day 60 capsule 5  . furosemide (LASIX) 20 MG tablet Take 1 tablet (20 mg total) by mouth daily. (Patient not taking: Reported on 03/17/2016) 90 tablet 3  . HYDROcodone-acetaminophen (NORCO/VICODIN) 5-325 MG tablet 1/2 tablet every 6 hours as needed-caution drowsiness 30 tablet 0  . rivaroxaban (XARELTO) 20 MG TABS tablet Take 1 tablet (20 mg total) by mouth daily with supper. 30 tablet 6   No current facility-administered medications for this visit.  Past Surgical History:  Procedure Laterality Date  . APPENDECTOMY    . APPENDECTOMY    . BREAST SURGERY    . COLON RESECTION    . COLON SURGERY    . HERNIA REPAIR    . HERNIA REPAIR       Allergies  Allergen Reactions  . Amiodarone     itching  . Amoxicillin Hives  . Cephalosporins Itching  . Hydrocodone Itching and Nausea Only    In large doses Made her feel like she was in somebody else's house when getting up at night after taking it  . Lipitor [Atorvastatin] Other (See Comments)    unknown  . Lorazepam Other (See Comments)    Exceptional fatigue   . Lovenox [Enoxaparin Sodium]  Nausea And Vomiting  . Morphine And Related Itching  . Penicillins Swelling    Has patient had a PCN reaction causing immediate rash, facial/tongue/throat swelling, SOB or lightheadedness with hypotension: unknown Has patient had a PCN reaction causing severe rash involving mucus membranes or skin necrosis: unknown Has patient had a PCN reaction that required hospitalization No Has patient had a PCN reaction occurring within the last 10 years: no If all of the above answers are "NO", then may proceed with Cephalosporin use.   . Sulfa Antibiotics Swelling      Family History  Problem Relation Age of Onset  . Diabetes Mother   . Heart disease    . Arthritis    . Cancer    . Hyperlipidemia Brother      Social History [name] reports that she has never smoked. She has never used smokeless tobacco. [name] reports that she does not drink alcohol.   Review of Systems CONSTITUTIONAL: occasional fatigue HEENT: Eyes: No visual loss, blurred vision, double vision or yellow sclerae.No hearing loss, sneezing, congestion, runny nose or sore throat.  SKIN: No rash or itching.  CARDIOVASCULAR: per HPI RESPIRATORY: No shortness of breath, cough or sputum.  GASTROINTESTINAL: No anorexia, nausea, vomiting or diarrhea. No abdominal pain or blood.  GENITOURINARY: No burning on urination, no polyuria NEUROLOGICAL: per HPI MUSCULOSKELETAL: No muscle, back pain, joint pain or stiffness.  LYMPHATICS: No enlarged nodes. No history of splenectomy.  PSYCHIATRIC: No history of depression or anxiety.  ENDOCRINOLOGIC: No reports of sweating, cold or heat intolerance. No polyuria or polydipsia.  Marland Kitchen   Physical Examination Vitals:   03/23/16 1326  BP: 140/80  Pulse: 77   Vitals:   03/23/16 1326  Weight: 161 lb (73 kg)  Height: 5\' 4"  (1.626 m)    Gen: resting comfortably, no acute distress HEENT: no scleral icterus, pupils equal round and reactive, no palptable cervical adenopathy,    CV: RRR, no m/r/g, no jvd Resp: Clear to auscultation bilaterally GI: abdomen is soft, non-tender, non-distended, normal bowel sounds, no hepatosplenomegaly MSK: extremities are warm, no edema.  Skin: warm, no rash Neuro:  no focal deficits Psych: appropriate affect     Assessment and Plan    1. Afib/Aflutter - side effects on higher dose AV nodal agents, did not resolve her symptoms of palpitatoins - started on amiodarone, continues to be in aflutter based on clinic EKG today - CHADS2Vasc score of 4, continue anticoag - due to ongoing fatigue and dizziness, will plan for DCCV to see if improves her symptoms. She has been on anticoag consistently well over 3 weeks.    2. HTN - at goal, she will continue current meds   3. LE edema - controlled,  continue lasix.  Arnoldo Lenis, M.D.,

## 2016-03-23 NOTE — Patient Instructions (Signed)
Medication Instructions:  Your physician recommends that you continue on your current medications as directed. Please refer to the Current Medication list given to you today.   Labwork: NONE  Testing/Procedures: Your physician has recommended that you have a Cardioversion (DCCV). Electrical Cardioversion uses a jolt of electricity to your heart either through paddles or wired patches attached to your chest. This is a controlled, usually prescheduled, procedure. Defibrillation is done under light anesthesia in the hospital, and you usually go home the day of the procedure. This is done to get your heart back into a normal rhythm. You are not awake for the procedure. Please see the instruction sheet given to you today.    Follow-Up: Your physician recommends that you schedule a follow-up appointment in: 3-4 WEEKS AFTER CARDIOVERSION    Any Other Special Instructions Will Be Listed Below (If Applicable).     If you need a refill on your cardiac medications before your next appointment, please call your pharmacy.

## 2016-03-28 ENCOUNTER — Other Ambulatory Visit: Payer: Self-pay | Admitting: Adult Health

## 2016-03-28 NOTE — Patient Instructions (Signed)
[name]  03/28/2016     @PREFPERIOPPHARMACY @   Your procedure is scheduled on 03/31/2016.  Report to [facility] at 8:00 A.M.  Call this number if you have problems the morning of surgery:  [phone]   Remember:  Do not eat food or drink liquids after midnight.  Take these medicines the morning of surgery with A SIP OF WATER : XANAX, PACERONE, CARDIZEM, CYMBALATA, NORCO AND XERELTO   Do not wear jewelry, make-up or nail polish.  Do not wear lotions, powders, or perfumes, or deoderant.  Do not shave 48 hours prior to surgery.  Men may shave face and neck.  Do not bring valuables to the hospital.  [facility] is not responsible for any belongings or valuables.  Contacts, dentures or bridgework may not be worn into surgery.  Leave your suitcase in the car.  After surgery it may be brought to your room.  For patients admitted to the hospital, discharge time will be determined by your treatment team.  Patients discharged the day of surgery will not be allowed to drive home.   Name and phone number of your driver:   FAMILY Special instructions:  N/A  Please read over the following fact sheets that you were given. Care and Recovery After Surgery     Electrical Cardioversion Electrical cardioversion is the delivery of a jolt of electricity to change the rhythm of the heart. Sticky patches or metal paddles are placed on the chest to deliver the electricity from a device. This is done to restore a normal rhythm. A rhythm that is too fast or not regular keeps the heart from pumping well. Electrical cardioversion is done in an emergency if:   There is low or no blood pressure as a result of the heart rhythm.   Normal rhythm must be restored as fast as possible to protect the brain and heart from further damage.   It may save a life. Cardioversion may be done for heart rhythms that are not immediately life threatening, such as atrial fibrillation or flutter, in which:    The heart is beating too fast or is not regular.   Medicine to change the rhythm has not worked.   It is safe to wait in order to allow time for preparation.  Symptoms of the abnormal rhythm are bothersome.  The risk of stroke and other serious problems can be reduced. LET [facility] CARE PROVIDER KNOW ABOUT:   Any allergies you have.  All medicines you are taking, including vitamins, herbs, eye drops, creams, and over-the-counter medicines.  Previous problems you or members of your family have had with the use of anesthetics.   Any blood disorders you have.   Previous surgeries you have had.   Medical conditions you have. RISKS AND COMPLICATIONS  Generally, this is a safe procedure. However, problems can occur and include:   Breathing problems related to the anesthetic used.  A blood clot that breaks free and travels to other parts of your body. This could cause a stroke or other problems. The risk of this is lowered by use of blood-thinning medicine (anticoagulant) prior to the procedure.  Cardiac arrest (rare). BEFORE THE PROCEDURE   You may have tests to detect blood clots in your heart and to evaluate heart function.  You may start taking anticoagulants so your blood does not clot as easily.   Medicines may be given to help stabilize your heart rate and rhythm. PROCEDURE  You will be  given medicine through an IV tube to reduce discomfort and make you sleepy (sedative).   An electrical shock will be delivered. AFTER THE PROCEDURE Your heart rhythm will be watched to make sure it does not change.    This information is not intended to replace advice given to you by your health care provider. Make sure you discuss any questions you have with your health care provider.   Document Released: 04/29/2002 Document Revised: 05/30/2014 Document Reviewed: 11/21/2012 Elsevier Interactive Patient Education [employer].

## 2016-03-29 ENCOUNTER — Telehealth: Payer: Self-pay | Admitting: *Deleted

## 2016-03-29 ENCOUNTER — Encounter (HOSPITAL_COMMUNITY): Payer: Self-pay

## 2016-03-29 ENCOUNTER — Encounter (HOSPITAL_COMMUNITY)
Admission: RE | Admit: 2016-03-29 | Discharge: 2016-03-29 | Disposition: A | Discharge status: Home / Self Care | Payer: Medicare Other | Source: Ambulatory Visit | Attending: Cardiovascular Disease | Admitting: Cardiovascular Disease

## 2016-03-29 DIAGNOSIS — Z79899 Other long term (current) drug therapy: Secondary | ICD-10-CM | POA: Diagnosis not present

## 2016-03-29 DIAGNOSIS — I4891 Unspecified atrial fibrillation: Secondary | ICD-10-CM | POA: Diagnosis not present

## 2016-03-29 DIAGNOSIS — R600 Localized edema: Secondary | ICD-10-CM | POA: Diagnosis not present

## 2016-03-29 DIAGNOSIS — Z7901 Long term (current) use of anticoagulants: Secondary | ICD-10-CM | POA: Diagnosis not present

## 2016-03-29 DIAGNOSIS — F329 Major depressive disorder, single episode, unspecified: Secondary | ICD-10-CM | POA: Diagnosis not present

## 2016-03-29 DIAGNOSIS — I10 Essential (primary) hypertension: Secondary | ICD-10-CM | POA: Diagnosis not present

## 2016-03-29 LAB — CBC
HEMATOCRIT: 40.3 % (ref 36.0–46.0)
Hemoglobin: 13.1 g/dL (ref 12.0–15.0)
MCH: 28.7 pg (ref 26.0–34.0)
MCHC: 32.5 g/dL (ref 30.0–36.0)
MCV: 88.2 fL (ref 78.0–100.0)
Platelets: 275 10*3/uL (ref 150–400)
RBC: 4.57 MIL/uL (ref 3.87–5.11)
RDW: 13.2 % (ref 11.5–15.5)
WBC: 9 10*3/uL (ref 4.0–10.5)

## 2016-03-29 LAB — BASIC METABOLIC PANEL
Anion gap: 7 (ref 5–15)
BUN: 18 mg/dL (ref 6–20)
CALCIUM: 9 mg/dL (ref 8.9–10.3)
CO2: 26 mmol/L (ref 22–32)
CREATININE: 0.85 mg/dL (ref 0.44–1.00)
Chloride: 102 mmol/L (ref 101–111)
GFR calc Af Amer: >60 mL/min (ref >60)
GFR calc non Af Amer: 59 mL/min — ABNORMAL LOW (ref >60)
GLUCOSE: 128 mg/dL — AB (ref 65–99)
Potassium: 3.4 mmol/L — ABNORMAL LOW (ref 3.5–5.1)
Sodium: 135 mmol/L (ref 135–145)

## 2016-03-29 MED ORDER — RIVAROXABAN 20 MG PO TABS: 20.0000 mg | ORAL_TABLET | Freq: Every day | ORAL | 6 refills | Status: DC

## 2016-03-29 MED ORDER — POTASSIUM CHLORIDE CRYS ER 20 MEQ PO TBCR: 20.0000 meq | EXTENDED_RELEASE_TABLET | ORAL | 3 refills | Status: DC | PRN

## 2016-03-29 MED ORDER — POTASSIUM CHLORIDE CRYS ER 20 MEQ PO TBCR: 20.0000 meq | EXTENDED_RELEASE_TABLET | Freq: Every day | ORAL | 3 refills | Status: DC

## 2016-03-29 NOTE — Telephone Encounter (Signed)
Notes Recorded by Shellia Cleverly, RN on 03/29/2016 at 4:20 PM EST Patient takes Furosemide 20 mg prn, not on any K+. Will have Dr Johnsie Cancel review.    Ref Range & Units 13:30 56mo ago 36mo ago 31mo ago 66mo ago   Sodium 135 - 145 mmol/L 135  137  139  145R   139    Potassium 3.5 - 5.1 mmol/L 3.4   3.3   4.3  4.6R  3.5    Chloride 101 - 111 mmol/L 102  101  104  104R  103    CO2 22 - 32 mmol/L 26  25  29   23R  27    Glucose, Bld 65 - 99 mg/dL 128   118   111   104   153     BUN 6 - 20 mg/dL 18  23   17   10R  15    Creatinine, Ser 0.44 - 1.00 mg/dL 0.85  0.82  0.72  0.71R  0.80    Calcium 8.9 - 10.3 mg/dL 9.0  9.1  9.1  9.3R  8.9    GFR calc non Af Amer >60 mL/min 59   >60  >60  76R  >60    GFR calc Af Amer >60 mL/min >60  >60CM  >60CM  87R  >60CM        Pt aware of results.  She is only taking Furosemide prn and not on a daily basis. She is aware K+ to low at 3.4 and to take 20 MEQ when she gets it filled and after that to take when she takes Furosemide.  She is aware to have her blood work in 3 weeks.  RX sent into [facility].  They are aware to deliver to pt's home.

## 2016-03-31 ENCOUNTER — Encounter (HOSPITAL_COMMUNITY)
Admission: RE | Disposition: A | Discharge status: Home / Self Care | Payer: Self-pay | Source: Ambulatory Visit | Attending: Cardiovascular Disease

## 2016-03-31 ENCOUNTER — Ambulatory Visit (HOSPITAL_COMMUNITY): Payer: Medicare Other | Admitting: Anesthesiology

## 2016-03-31 ENCOUNTER — Ambulatory Visit (HOSPITAL_COMMUNITY)
Admission: RE | Admit: 2016-03-31 | Discharge: 2016-03-31 | Disposition: A | Discharge status: Home / Self Care | Payer: Medicare Other | Source: Ambulatory Visit | Attending: Cardiovascular Disease | Admitting: Cardiovascular Disease

## 2016-03-31 ENCOUNTER — Encounter (HOSPITAL_COMMUNITY): Payer: Self-pay

## 2016-03-31 DIAGNOSIS — R600 Localized edema: Secondary | ICD-10-CM | POA: Insufficient documentation

## 2016-03-31 DIAGNOSIS — Z79899 Other long term (current) drug therapy: Secondary | ICD-10-CM | POA: Insufficient documentation

## 2016-03-31 DIAGNOSIS — Z7901 Long term (current) use of anticoagulants: Secondary | ICD-10-CM | POA: Diagnosis not present

## 2016-03-31 DIAGNOSIS — F329 Major depressive disorder, single episode, unspecified: Secondary | ICD-10-CM | POA: Diagnosis not present

## 2016-03-31 DIAGNOSIS — I4891 Unspecified atrial fibrillation: Secondary | ICD-10-CM | POA: Diagnosis not present

## 2016-03-31 DIAGNOSIS — I10 Essential (primary) hypertension: Secondary | ICD-10-CM | POA: Diagnosis not present

## 2016-03-31 HISTORY — PX: CARDIOVERSION: SHX1299

## 2016-03-31 SURGERY — CARDIOVERSION
Anesthesia: Monitor Anesthesia Care

## 2016-03-31 MED ORDER — FENTANYL CITRATE (PF) 100 MCG/2ML IJ SOLN
INTRAMUSCULAR | Status: AC
  Filled 2016-03-31: qty 2

## 2016-03-31 MED ORDER — FENTANYL CITRATE (PF) 100 MCG/2ML IJ SOLN
INTRAMUSCULAR | Status: DC | PRN
  Administered 2016-03-31: 12.5 ug via INTRAVENOUS

## 2016-03-31 MED ORDER — PROPOFOL 500 MG/50ML IV EMUL
INTRAVENOUS | Status: DC | PRN
  Administered 2016-03-31: 50 ug/kg/min via INTRAVENOUS

## 2016-03-31 MED ORDER — PROPOFOL 10 MG/ML IV BOLUS
INTRAVENOUS | Status: AC
  Filled 2016-03-31: qty 40

## 2016-03-31 MED ORDER — PHENYLEPHRINE 40 MCG/ML (10ML) SYRINGE FOR IV PUSH (FOR BLOOD PRESSURE SUPPORT)
PREFILLED_SYRINGE | INTRAVENOUS | Status: AC
  Filled 2016-03-31: qty 10

## 2016-03-31 MED ORDER — PHENYLEPHRINE HCL 10 MG/ML IJ SOLN
INTRAMUSCULAR | Status: AC
  Filled 2016-03-31: qty 1

## 2016-03-31 MED ORDER — PROPOFOL 10 MG/ML IV BOLUS
INTRAVENOUS | Status: DC | PRN
  Administered 2016-03-31 (×2): 20 mg via INTRAVENOUS

## 2016-03-31 MED ORDER — MIDAZOLAM HCL 2 MG/2ML IJ SOLN
INTRAMUSCULAR | Status: AC
  Filled 2016-03-31: qty 2

## 2016-03-31 MED ORDER — LACTATED RINGERS IV SOLN
INTRAVENOUS | Status: DC
  Administered 2016-03-31: 09:00:00 via INTRAVENOUS

## 2016-03-31 NOTE — H&P (View-Only) (Signed)
Clinical Summary [name] is a 82 y.o.female seen today for follow up of the following medical problems.   1. Afib/Aflutter - metoprolol makes her tired, higher dose made her more fatigued and did not improve her palpitatins.  -  changed to dilttiazem due to ongoing palpitations with increased beta blocker dose and fatigue.  - cardizem caused leg swelling and burning on the bottom of her feet. She restarted lopressor and stopped dilt - due to side effects and ineffectiveness of AV nodal agents started amiodarone.    No recent palpitations.Can have some fatigue and dizziness, feeling of being off balance.     2. LE edema - noted recently, bilateral leg swelling. No significant SOB or DOE - minimal salt in diet.  - has been on lasix  - overall controlled   SH: her sister is also a patient of mine, Hydrograph[employer]or  Past Medical History:  Diagnosis Date  . Abdominal pain, chronic, right lower quadrant    "ever since I had my appendix removed"  . Atrial fibrillation ([place])   . Cancer ([place])    left side  . Chronic back pain   . Edema   . GERD (gastroesophageal reflux disease)   . Hyperlipidemia   . Hypertension   . Osteopenia   . Osteoporosis   . Ovarian cyst 02/2015   left  . Recurrent abdominal pain    LLQ     Allergies  Allergen Reactions  . Amiodarone     itching  . Amoxicillin Hives  . Cephalosporins Itching  . Hydrocodone Itching and Nausea Only    In large doses Made her feel like she was in somebody else's house when getting up at night after taking it  . Lipitor [Atorvastatin] Other (See Comments)    unknown  . Lorazepam Other (See Comments)    Exceptional fatigue   . Lovenox [Enoxaparin Sodium] Nausea And Vomiting  . Morphine And Related Itching  . Penicillins Swelling    Has patient had a PCN reaction causing immediate rash, facial/tongue/throat swelling, SOB or lightheadedness with hypotension: unknown Has patient had a PCN reaction  causing severe rash involving mucus membranes or skin necrosis: unknown Has patient had a PCN reaction that required hospitalization No Has patient had a PCN reaction occurring within the last 10 years: no If all of the above answers are "NO", then may proceed with Cephalosporin use.   . Sulfa Antibiotics Swelling     Current Outpatient Prescriptions  Medication Sig Dispense Refill  . ALPRAZolam (XANAX) 0.5 MG tablet Take 1-2 tablets (0.5-1 mg total) by mouth at bedtime as needed for anxiety. (Patient taking differently: Take 0.25-1 mg by mouth at bedtime as needed for anxiety. ) 30 tablet 5  . amiodarone (PACERONE) 200 MG tablet Take 200mg  Two Times Daily for 1 month, then Change to 200mg  Daily 90 tablet 3  . diltiazem (CARDIZEM CD) 180 MG 24 hr capsule Take 180 mg by mouth daily.    . DULoxetine (CYMBALTA) 20 MG capsule Take two every day 60 capsule 5  . furosemide (LASIX) 20 MG tablet Take 1 tablet (20 mg total) by mouth daily. (Patient not taking: Reported on 03/17/2016) 90 tablet 3  . HYDROcodone-acetaminophen (NORCO/VICODIN) 5-325 MG tablet 1/2 tablet every 6 hours as needed-caution drowsiness 30 tablet 0  . rivaroxaban (XARELTO) 20 MG TABS tablet Take 1 tablet (20 mg total) by mouth daily with supper. 30 tablet 6   No current facility-administered medications for this visit.  Past Surgical History:  Procedure Laterality Date  . APPENDECTOMY    . APPENDECTOMY    . BREAST SURGERY    . COLON RESECTION    . COLON SURGERY    . HERNIA REPAIR    . HERNIA REPAIR       Allergies  Allergen Reactions  . Amiodarone     itching  . Amoxicillin Hives  . Cephalosporins Itching  . Hydrocodone Itching and Nausea Only    In large doses Made her feel like she was in somebody else's house when getting up at night after taking it  . Lipitor [Atorvastatin] Other (See Comments)    unknown  . Lorazepam Other (See Comments)    Exceptional fatigue   . Lovenox [Enoxaparin Sodium]  Nausea And Vomiting  . Morphine And Related Itching  . Penicillins Swelling    Has patient had a PCN reaction causing immediate rash, facial/tongue/throat swelling, SOB or lightheadedness with hypotension: unknown Has patient had a PCN reaction causing severe rash involving mucus membranes or skin necrosis: unknown Has patient had a PCN reaction that required hospitalization No Has patient had a PCN reaction occurring within the last 10 years: no If all of the above answers are "NO", then may proceed with Cephalosporin use.   . Sulfa Antibiotics Swelling      Family History  Problem Relation Age of Onset  . Diabetes Mother   . Heart disease    . Arthritis    . Cancer    . Hyperlipidemia Brother      Social History Ms. Hauswirth reports that she has never smoked. She has never used smokeless tobacco. Ms. Schwaderer reports that she does not drink alcohol.   Review of Systems CONSTITUTIONAL: occasional fatigue HEENT: Eyes: No visual loss, blurred vision, double vision or yellow sclerae.No hearing loss, sneezing, congestion, runny nose or sore throat.  SKIN: No rash or itching.  CARDIOVASCULAR: per HPI RESPIRATORY: No shortness of breath, cough or sputum.  GASTROINTESTINAL: No anorexia, nausea, vomiting or diarrhea. No abdominal pain or blood.  GENITOURINARY: No burning on urination, no polyuria NEUROLOGICAL: per HPI MUSCULOSKELETAL: No muscle, back pain, joint pain or stiffness.  LYMPHATICS: No enlarged nodes. No history of splenectomy.  PSYCHIATRIC: No history of depression or anxiety.  ENDOCRINOLOGIC: No reports of sweating, cold or heat intolerance. No polyuria or polydipsia.  Marland Kitchen   Physical Examination Vitals:   03/23/16 1326  BP: 140/80  Pulse: 77   Vitals:   03/23/16 1326  Weight: 161 lb (73 kg)  Height: 5\' 4"  (1.626 m)    Gen: resting comfortably, no acute distress HEENT: no scleral icterus, pupils equal round and reactive, no palptable cervical adenopathy,    CV: RRR, no m/r/g, no jvd Resp: Clear to auscultation bilaterally GI: abdomen is soft, non-tender, non-distended, normal bowel sounds, no hepatosplenomegaly MSK: extremities are warm, no edema.  Skin: warm, no rash Neuro:  no focal deficits Psych: appropriate affect     Assessment and Plan    1. Afib/Aflutter - side effects on higher dose AV nodal agents, did not resolve her symptoms of palpitatoins - started on amiodarone, continues to be in aflutter based on clinic EKG today - CHADS2Vasc score of 4, continue anticoag - due to ongoing fatigue and dizziness, will plan for DCCV to see if improves her symptoms. She has been on anticoag consistently well over 3 weeks.    2. HTN - at goal, she will continue current meds   3. LE edema - controlled,  continue lasix.  Arnoldo Lenis, M.D.,

## 2016-03-31 NOTE — Transfer of Care (Signed)
Immediate Anesthesia Transfer of Care Note  Patient: [name]  Procedure(s) Performed: Procedure(s): CARDIOVERSION (N/A)  Patient Location: PACU  Anesthesia Type:MAC  Level of Consciousness: awake and patient cooperative  Airway & Oxygen Therapy: Patient Spontanous Breathing and Patient connected to nasal cannula oxygen  Post-op Assessment: Report given to RN and Post -op Vital signs reviewed and stable  Post vital signs: Reviewed and stable  Last Vitals:  Vitals:   03/31/16 0844  Temp: 37 C    Last Pain:  Vitals:   03/31/16 0844  TempSrc: Oral      Patients Stated Pain Goal: 6 ([id number])  Complications: No apparent anesthesia complications

## 2016-03-31 NOTE — OR Nursing (Cosign Needed)
HR REGULAR RADIAL PULSE STRONG

## 2016-03-31 NOTE — OR Nursing (Signed)
Electrical Cardioversion Procedure Note [name] [id number] June 29, 1923  Procedure: Electrical Cardioversion Indications:  Atrial Fibrillation  Procedure Details Consent: Risks of procedure as well as the alternatives and risks of each were explained to the (patient/caregiver).  Consent for procedure obtained. Time Out: Verified patient identification, verified procedure, site/side was marked, verified correct patient position, special equipment/implants available, medications/allergies/relevent history reviewed, required imaging and test results available.  time out performed:@ 0944  Patient placed on cardiac monitor, pulse oximetry, supplemental oxygen as necessary.  Sedation given: Propofol Pacer pads placed anterior and posterior chest.  Cardioverted 1 time(s).  Cardioverted at 120 J @ 0951 Evaluation Findings: Post procedure EKG shows: NSR Complications: None Patient did tolerate procedure well.   Renda Rolls Anne 03/31/2016, 10:14 AM

## 2016-03-31 NOTE — Anesthesia Procedure Notes (Signed)
Procedure Name: MAC Date/Time: 03/31/2016 9:54 AM Performed by: Vista Deck Pre-anesthesia Checklist: Patient identified, Emergency Drugs available, Suction available, Timeout performed and Patient being monitored Patient Re-evaluated:Patient Re-evaluated prior to inductionOxygen Delivery Method: Nasal Cannula

## 2016-03-31 NOTE — Anesthesia Postprocedure Evaluation (Signed)
Anesthesia Post Note  Patient: [name]  Procedure(s) Performed: Procedure(s) (LRB): CARDIOVERSION (N/A)  Patient location during evaluation: PACU Anesthesia Type: MAC Level of consciousness: awake Pain management: satisfactory to patient Vital Signs Assessment: post-procedure vital signs reviewed and stable Respiratory status: spontaneous breathing Cardiovascular status: stable Anesthetic complications: no    Last Vitals:  Vitals:   03/31/16 0844  Temp: 37 C    Last Pain:  Vitals:   03/31/16 0844  TempSrc: Oral                 Mattalyn Anderegg

## 2016-03-31 NOTE — Interval H&P Note (Signed)
History and Physical Interval Note:  03/31/2016 8:30 AM  [name]  has presented today for surgery, with the diagnosis of a-fib  The various methods of treatment have been discussed with the patient and family. After consideration of risks, benefits and other options for treatment, the patient has consented to  Procedure(s): CARDIOVERSION (N/A) as a surgical intervention .  The patient's history has been reviewed, patient examined, no change in status, stable for surgery.  I have reviewed the patient's chart and labs.  Questions were answered to the patient's satisfaction.     Jenkins Rouge

## 2016-03-31 NOTE — Anesthesia Procedure Notes (Signed)
Procedure Name: MAC Date/Time: 03/31/2016 9:16 AM Performed by: Vista Deck Pre-anesthesia Checklist: Patient identified, Emergency Drugs available, Suction available, Timeout performed and Patient being monitored Patient Re-evaluated:Patient Re-evaluated prior to inductionOxygen Delivery Method: Nasal Cannula

## 2016-03-31 NOTE — Discharge Instructions (Signed)
Electrical Cardioversion, Care After °Refer to this sheet in the next few weeks. These instructions provide you with information on caring for yourself after your procedure. Your health care provider may also give you more specific instructions. Your treatment has been planned according to current medical practices, but problems sometimes occur. Call your health care provider if you have any problems or questions after your procedure. °WHAT TO EXPECT AFTER THE PROCEDURE °After your procedure, it is typical to have the following sensations: °· Some redness on the skin where the shocks were delivered. If this is tender, a sunburn lotion or hydrocortisone cream may help. °· Possible return of an abnormal heart rhythm within hours or days after the procedure. °HOME CARE INSTRUCTIONS °· Take medicines only as directed by your health care provider. Be sure you understand how and when to take your medicine. °· Learn how to feel your pulse and check it often. °· Limit your activity for 48 hours after the procedure or as directed by your health care provider. °· Avoid or minimize caffeine and other stimulants as directed by your health care provider. °SEEK MEDICAL CARE IF: °· You feel like your heart is beating too fast or your pulse is not regular. °· You have any questions about your medicines. °· You have bleeding that will not stop. °SEEK IMMEDIATE MEDICAL CARE IF: °· You are dizzy or feel faint. °· It is hard to breathe or you feel short of breath. °· There is a change in discomfort in your chest. °· Your speech is slurred or you have trouble moving an arm or leg on one side of your body. °· You get a serious muscle cramp that does not go away. °· Your fingers or toes turn cold or blue. °  °This information is not intended to replace advice given to you by your health care provider. Make sure you discuss any questions you have with your health care provider. °  °Document Released: 02/27/2013 Document Revised: 05/30/2014  Document Reviewed: 02/27/2013 °Elsevier Interactive Patient Education ©2016 [employer] ° °

## 2016-03-31 NOTE — CV Procedure (Signed)
DCC: No missed doses NOAC Anesthesia Propofol  AFib rate 98 DCC x 1 120 J  NSR rate 78  No immediate neurologic sequelae  Jenkins Rouge

## 2016-03-31 NOTE — Anesthesia Procedure Notes (Signed)
Procedure Name: MAC Date/Time: 03/31/2016 9:22 AM Performed by: Vista Deck Pre-anesthesia Checklist: Patient identified, Emergency Drugs available, Suction available, Timeout performed and Patient being monitored Patient Re-evaluated:Patient Re-evaluated prior to inductionOxygen Delivery Method: Non-rebreather mask

## 2016-03-31 NOTE — Anesthesia Preprocedure Evaluation (Signed)
Anesthesia Evaluation  Patient identified by MRN, date of birth, ID band Patient awake    Reviewed: Allergy & Precautions, NPO status , Patient's Chart, lab work & pertinent test results  Airway Mallampati: III  TM Distance: >3 FB Neck ROM: Full    Dental  (+) Teeth Intact, Missing   Pulmonary    breath sounds clear to auscultation       Cardiovascular hypertension, Pt. on medications + dysrhythmias Atrial Fibrillation  Rhythm:Irregular Rate:Normal     Neuro/Psych PSYCHIATRIC DISORDERS Anxiety Depression  Neuromuscular disease    GI/Hepatic GERD  ,  Endo/Other    Renal/GU      Musculoskeletal   Abdominal   Peds  Hematology   Anesthesia Other Findings   Reproductive/Obstetrics                             Anesthesia Physical Anesthesia Plan  ASA: III  Anesthesia Plan: MAC   Post-op Pain Management:    Induction: Intravenous  Airway Management Planned: Simple Face Mask  Additional Equipment:   Intra-op Plan:   Post-operative Plan:   Informed Consent: I have reviewed the patients History and Physical, chart, labs and discussed the procedure including the risks, benefits and alternatives for the proposed anesthesia with the patient or authorized representative who has indicated his/her understanding and acceptance.     Plan Discussed with:   Anesthesia Plan Comments:         Anesthesia Quick Evaluation

## 2016-04-01 ENCOUNTER — Encounter (HOSPITAL_COMMUNITY): Payer: Self-pay | Admitting: Cardiovascular Disease

## 2016-04-11 ENCOUNTER — Encounter (HOSPITAL_COMMUNITY): Payer: Self-pay | Admitting: Emergency Medicine

## 2016-04-11 ENCOUNTER — Emergency Department (HOSPITAL_COMMUNITY)
Admission: EM | Admit: 2016-04-11 | Discharge: 2016-04-11 | Disposition: A | Discharge status: Home / Self Care | Payer: Medicare Other | Attending: Emergency Medicine | Admitting: Emergency Medicine

## 2016-04-11 DIAGNOSIS — M545 Low back pain, unspecified: Secondary | ICD-10-CM

## 2016-04-11 DIAGNOSIS — I10 Essential (primary) hypertension: Secondary | ICD-10-CM | POA: Insufficient documentation

## 2016-04-11 DIAGNOSIS — R51 Headache: Secondary | ICD-10-CM | POA: Insufficient documentation

## 2016-04-11 DIAGNOSIS — R531 Weakness: Secondary | ICD-10-CM | POA: Diagnosis not present

## 2016-04-11 DIAGNOSIS — Z85828 Personal history of other malignant neoplasm of skin: Secondary | ICD-10-CM | POA: Insufficient documentation

## 2016-04-11 DIAGNOSIS — Z79899 Other long term (current) drug therapy: Secondary | ICD-10-CM | POA: Diagnosis not present

## 2016-04-11 MED ORDER — DIAZEPAM 2 MG PO TABS
2.0000 mg | ORAL_TABLET | Freq: Once | ORAL | Status: AC
  Administered 2016-04-11: 2 mg via ORAL
  Filled 2016-04-11: qty 1

## 2016-04-11 MED ORDER — HYDROMORPHONE HCL 1 MG/ML IJ SOLN
0.5000 mg | Freq: Once | INTRAMUSCULAR | Status: AC
Start: 2016-04-11 — End: 2016-04-11
  Administered 2016-04-11: 0.5 mg via INTRAMUSCULAR
  Filled 2016-04-11: qty 1

## 2016-04-11 MED ORDER — KETOROLAC TROMETHAMINE 30 MG/ML IJ SOLN
15.0000 mg | Freq: Once | INTRAMUSCULAR | Status: AC
  Administered 2016-04-11: 15 mg via INTRAMUSCULAR
  Filled 2016-04-11: qty 1

## 2016-04-11 MED ORDER — MELOXICAM 7.5 MG PO TABS: 7.5000 mg | ORAL_TABLET | Freq: Every day | ORAL | 0 refills | Status: DC | PRN

## 2016-04-11 NOTE — ED Triage Notes (Signed)
Pt states she had cardioversion last week, now have back pain, radiating around side, headache

## 2016-04-11 NOTE — ED Notes (Signed)
Pt had cardioversion on 03/31/2016

## 2016-04-11 NOTE — ED Provider Notes (Signed)
[name] Provider Note   CSN: [id number] Arrival date & time: 04/11/16  1455   By signing my name below, I, [name], attest that this documentation has been prepared under the direction and in the presence of Virgel Manifold, MD. Electronically Signed: Rayna [name], ED Scribe. 04/11/16. 7:29 PM.  History   Chief Complaint Chief Complaint  Patient presents with  . Back Pain    HPI HPI Comments: [name] is a 82 y.o. female who presents to the Emergency Department complaining of gradual onset, moderate, diffuse, lower back pain x 11 days. Pt states she had a cardioversion performed on 03/31/2016 and began experiencing her pain later that evening. She states the next day she slid out of a chair and was experiencing weakness and could not get herself up. She reports associated, diffuse, HA. Her pain worsens with ambulation and alleviates when lying supine. She was initially taking tramadol w/o relief and was then given hydrocodone which she took for a short period of time. She is now taking tylenol which provides mild short term relief. She denies a radiation of pain into her LEs, LE weakness, numbness, tingling, bowel or bladder incontinence or any acute urinary changes.   The history is provided by the patient and a relative. No language interpreter was used.    Past Medical History:  Diagnosis Date  . Abdominal pain, chronic, right lower quadrant    "ever since I had my appendix removed"  . Atrial fibrillation ([place])   . Cancer ([place])    left side  . Chronic back pain   . Edema   . GERD (gastroesophageal reflux disease)   . Hyperlipidemia   . Hypertension   . Osteopenia   . Osteoporosis   . Ovarian cyst 02/2015   left  . Recurrent abdominal pain    LLQ    Patient Active Problem List   Diagnosis Date Noted  . Major depression in partial remission ([place]) 03/17/2016  . Atrial fibrillation ([place]) 01/12/2016  . Osteoarthritis of right knee 12/15/2015  .  Major depression 09/22/2015  . Ventral hernia 05/21/2015  . Ovarian tumor 04/13/2015  . Squamous cell skin cancer 08/13/2014  . Impaired fasting glucose 08/05/2013  . Neuropathy ([place]) 01/11/2013  . Long term (current) use of anticoagulants 07/27/2012  . Atrial fibrillation with RVR ([place]) 01/29/2012  . Anxiety 01/29/2012  . GERD (gastroesophageal reflux disease) 01/29/2012  . Chronic anticoagulation 01/29/2012  . Sciatica of right side 05/10/2011    Past Surgical History:  Procedure Laterality Date  . APPENDECTOMY    . APPENDECTOMY    . BREAST SURGERY    . CARDIOVERSION N/A 03/31/2016   Procedure: CARDIOVERSION;  Surgeon: Josue Hector, MD;  Location: [facility];  Service: Cardiovascular;  Laterality: N/A;  . COLON RESECTION    . COLON SURGERY    . HERNIA REPAIR    . HERNIA REPAIR      OB History    Gravida Para Term Preterm AB Living   2 1     1 1    SAB TAB Ectopic Multiple Live Births   1               Home Medications    Prior to Admission medications   Medication Sig Start Date End Date Taking? Authorizing Provider  ALPRAZolam Duanne Moron) 0.5 MG tablet Take 1-2 tablets (0.5-1 mg total) by mouth at bedtime as needed for anxiety. Patient taking differently: Take 0.25-1 mg by mouth at bedtime as needed  for sleep.  09/22/15   [name], MD  amiodarone (PACERONE) 200 MG tablet Take 200mg  Two Times Daily for 1 month, then Change to 200mg  Daily Patient taking differently: Take 200 mg by mouth daily.  11/11/15   Arnoldo Lenis, MD  diltiazem (CARDIZEM CD) 180 MG 24 hr capsule Take 180 mg by mouth daily.    Historical Provider, MD  DULoxetine (CYMBALTA) 20 MG capsule Take two every day Patient taking differently: Take 40 mg by mouth daily.  01/12/16   [name], MD  furosemide (LASIX) 20 MG tablet Take 1 tablet (20 mg total) by mouth daily. Patient taking differently: Take 20 mg by mouth daily as needed for fluid or edema.  11/09/15   Arnoldo Lenis, MD    HYDROcodone-acetaminophen (NORCO/VICODIN) 5-325 MG tablet 1/2 tablet every 6 hours as needed-caution drowsiness Patient taking differently: Take 0.5 tablets by mouth every 6 (six) hours as needed for moderate pain.  12/15/15   [name], MD  potassium chloride SA (K-DUR,KLOR-CON) 20 MEQ tablet Take 1 tablet (20 mEq total) by mouth as needed (on days taking Furosemide). 03/29/16   Josue Hector, MD  rivaroxaban (XARELTO) 20 MG TABS tablet Take 1 tablet (20 mg total) by mouth daily with supper. 03/29/16   Josue Hector, MD    Family History Family History  Problem Relation Age of Onset  . Diabetes Mother   . Heart disease    . Arthritis    . Cancer    . Hyperlipidemia Brother     Social History Social History  Substance Use Topics  . Smoking status: Never Smoker  . Smokeless tobacco: Never Used  . Alcohol use No     Allergies   Amiodarone; Amoxicillin; Cephalosporins; Hydrocodone; Lipitor [atorvastatin]; Lorazepam; Lovenox [enoxaparin sodium]; Morphine and related; Penicillins; and Sulfa antibiotics   Review of Systems Review of Systems  Genitourinary: Negative for difficulty urinating, dysuria, frequency and hematuria.  Musculoskeletal: Positive for back pain and myalgias.  Neurological: Positive for weakness and headaches. Negative for numbness.  All other systems reviewed and are negative.  Physical Exam Updated Vital Signs BP 184/79 (BP Location: Left Arm)   Pulse 73   Temp 98.1 F (36.7 C) (Oral)   Resp 17   Ht 5\' 4"  (1.626 m)   Wt 160 lb (72.6 kg)   SpO2 95%   BMI 27.46 kg/m   Physical Exam  Constitutional: She is oriented to person, place, and time. She appears well-developed and well-nourished. No distress.  HENT:  Head: Normocephalic and atraumatic.  Eyes: EOM are normal.  Neck: Normal range of motion.  Cardiovascular: Normal rate, regular rhythm and normal heart sounds.   Pulmonary/Chest: Effort normal and breath sounds normal.  Abdominal: Soft.  She exhibits no distension. There is no tenderness.  Musculoskeletal: Normal range of motion. She exhibits tenderness.  Tenderness noted in the lumbar paraspinal musculature. No midline spinal tenderness. Negative straight leg raise bilaterally.   Neurological: She is alert and oriented to person, place, and time. She has normal strength.  Normal strength   Skin: Skin is warm and dry.  Psychiatric: She has a normal mood and affect. Judgment normal.  Nursing note and vitals reviewed.  ED Treatments / Results  Labs (all labs ordered are listed, but only abnormal results are displayed) Labs Reviewed - No data to display  EKG  EKG Interpretation None       Radiology No results found.  Procedures Procedures  DIAGNOSTIC  STUDIES: Oxygen Saturation is 100% on RA, normal by my interpretation.    COORDINATION OF CARE: 7:26 PM Discussed next steps with pt. Pt verbalized understanding and is agreeable with the plan.    Medications Ordered in ED Medications - No data to display   Initial Impression / Assessment and Plan / ED Course  I have reviewed the triage vital signs and the nursing notes.  Pertinent labs & imaging results that were available during my care of the patient were reviewed by me and considered in my medical decision making (see chart for details).  Clinical Course    I personally preformed the services scribed in my presence. The recorded information has been reviewed is accurate. Virgel Manifold, MD.  Final Clinical Impressions(s) / ED Diagnoses   Final diagnoses:  Acute bilateral low back pain without sciatica    New Prescriptions New Prescriptions   No medications on file     Virgel Manifold, MD 04/21/16 1429

## 2016-04-18 ENCOUNTER — Encounter: Payer: Self-pay | Admitting: Adult Health

## 2016-04-18 ENCOUNTER — Ambulatory Visit (INDEPENDENT_AMBULATORY_CARE_PROVIDER_SITE_OTHER): Payer: Medicare Other | Admitting: Adult Health

## 2016-04-18 VITALS — BP 150/72 | HR 84 | Ht 66.0 in | Wt 156.0 lb

## 2016-04-18 DIAGNOSIS — I4891 Unspecified atrial fibrillation: Secondary | ICD-10-CM

## 2016-04-18 DIAGNOSIS — I10 Essential (primary) hypertension: Secondary | ICD-10-CM

## 2016-04-18 NOTE — Patient Instructions (Signed)
Your physician wants you to follow-up in: 6 Months with Dr. Branch. You will receive a reminder letter in the mail two months in advance. If you don't receive a letter, please call our office to schedule the follow-up appointment.  Your physician recommends that you continue on your current medications as directed. Please refer to the Current Medication list given to you today.  If you need a refill on your cardiac medications before your next appointment, please call your pharmacy.  Thank you for choosing [facility]!   

## 2016-04-18 NOTE — Progress Notes (Signed)
Name: [name]    DOB: 30-Jul-1923  Age: 82 y.o.  MR#: [id number]       PCP:  Sallee Lange, MD      Insurance: Payor: BLUE CROSS [employer]UE [employer] MEDICARE / Plan: BCBS MEDICARE / Product Type: *No Product type* /   CC:   No chief complaint on file.   VS Vitals:   04/18/16 1449  BP: (!) 150/72  Pulse: 84  SpO2: 94%  Weight: 156 lb (70.8 kg)  Height: 5\' 6"  (1.676 m)    Weights Current Weight  04/18/16 156 lb (70.8 kg)  04/11/16 160 lb (72.6 kg)  03/29/16 161 lb (73 kg)    Blood Pressure  BP Readings from Last 3 Encounters:  04/18/16 (!) 150/72  04/11/16 184/79  03/31/16 (!) 150/74     Admit date:  (Not on file) Last encounter with RMR:  01/07/2016   Allergy Amiodarone; Amoxicillin; Cephalosporins; Lipitor [atorvastatin]; Lorazepam; Lovenox [enoxaparin sodium]; Morphine and related; Penicillins; and Sulfa antibiotics  Current Outpatient Prescriptions  Medication Sig Dispense Refill  . acetaminophen (TYLENOL) 500 MG tablet Take 500 mg by mouth every 6 (six) hours as needed for mild pain or moderate pain.    Marland Kitchen ALPRAZolam (XANAX) 0.5 MG tablet Take 1-2 tablets (0.5-1 mg total) by mouth at bedtime as needed for anxiety. (Patient taking differently: Take 0.25-1 mg by mouth at bedtime as needed for sleep. ) 30 tablet 5  . amiodarone (PACERONE) 200 MG tablet Take 200mg  Two Times Daily for 1 month, then Change to 200mg  Daily (Patient taking differently: Take 200 mg by mouth daily. ) 90 tablet 3  . diltiazem (CARDIZEM CD) 180 MG 24 hr capsule Take 180 mg by mouth daily.    . DULoxetine (CYMBALTA) 20 MG capsule Take two every day (Patient taking differently: Take 40 mg by mouth daily. ) 60 capsule 5  . furosemide (LASIX) 20 MG tablet Take 1 tablet (20 mg total) by mouth daily. (Patient taking differently: Take 20 mg by mouth daily as needed for fluid or edema. ) 90 tablet 3  . HYDROcodone-acetaminophen (NORCO/VICODIN) 5-325 MG tablet 1/2 tablet every 6 hours as needed-caution drowsiness  (Patient taking differently: Take 0.5 tablets by mouth every 6 (six) hours as needed for moderate pain. ) 30 tablet 0  . meloxicam (MOBIC) 7.5 MG tablet Take 1 tablet (7.5 mg total) by mouth daily as needed for pain. 4 tablet 0  . potassium chloride SA (K-DUR,KLOR-CON) 20 MEQ tablet Take 1 tablet (20 mEq total) by mouth as needed (on days taking Furosemide). (Patient taking differently: Take 20 mEq by mouth daily as needed (on days taking Furosemide). ) 90 tablet 3  . rivaroxaban (XARELTO) 20 MG TABS tablet Take 1 tablet (20 mg total) by mouth daily with supper. 30 tablet 6   No current facility-administered medications for this visit.     Discontinued Meds:   There are no discontinued medications.  Patient Active Problem List   Diagnosis Date Noted  . Major depression in partial remission ([place]) 03/17/2016  . Atrial fibrillation ([place]) 01/12/2016  . Osteoarthritis of right knee 12/15/2015  . Major depression 09/22/2015  . Ventral hernia 05/21/2015  . Ovarian tumor 04/13/2015  . Squamous cell skin cancer 08/13/2014  . Impaired fasting glucose 08/05/2013  . Neuropathy ([place]) 01/11/2013  . Long term (current) use of anticoagulants 07/27/2012  . Atrial fibrillation with RVR ([place]) 01/29/2012  . Anxiety 01/29/2012  . GERD (gastroesophageal reflux disease) 01/29/2012  . Chronic anticoagulation 01/29/2012  .  Sciatica of right side 05/10/2011    LABS    Component Value Date/Time   NA 135 03/29/2016 1330   NA 137 01/25/2016 1223   NA 139 12/12/2015 1737   NA 145 (H) 07/23/2015 1033   NA 141 03/09/2015 1333   NA 144 08/04/2014 0943   K 3.4 (L) 03/29/2016 1330   K 3.3 (L) 01/25/2016 1223   K 4.3 12/12/2015 1737   CL 102 03/29/2016 1330   CL 101 01/25/2016 1223   CL 104 12/12/2015 1737   CO2 26 03/29/2016 1330   CO2 25 01/25/2016 1223   CO2 29 12/12/2015 1737   GLUCOSE 128 (H) 03/29/2016 1330   GLUCOSE 118 (H) 01/25/2016 1223   GLUCOSE 111 (H) 12/12/2015 1737   BUN 18 03/29/2016  1330   BUN 23 (H) 01/25/2016 1223   BUN 17 12/12/2015 1737   BUN 10 07/23/2015 1033   BUN 15 03/09/2015 1333   BUN 13 08/04/2014 0943   CREATININE 0.85 03/29/2016 1330   CREATININE 0.82 01/25/2016 1223   CREATININE 0.72 12/12/2015 1737   CREATININE 0.64 09/10/2013 0948   CREATININE 0.72 03/21/2013 0843   CREATININE 0.78 12/14/2012 1315   CALCIUM 9.0 03/29/2016 1330   CALCIUM 9.1 01/25/2016 1223   CALCIUM 9.1 12/12/2015 1737   GFRNONAA 59 (L) 03/29/2016 1330   GFRNONAA >60 01/25/2016 1223   GFRNONAA >60 12/12/2015 1737   GFRAA >60 03/29/2016 1330   GFRAA >60 01/25/2016 1223   GFRAA >60 12/12/2015 1737   CMP     Component Value Date/Time   NA 135 03/29/2016 1330   NA 145 (H) 07/23/2015 1033   K 3.4 (L) 03/29/2016 1330   CL 102 03/29/2016 1330   CO2 26 03/29/2016 1330   GLUCOSE 128 (H) 03/29/2016 1330   BUN 18 03/29/2016 1330   BUN 10 07/23/2015 1033   CREATININE 0.85 03/29/2016 1330   CREATININE 0.64 09/10/2013 0948   CALCIUM 9.0 03/29/2016 1330   PROT 7.0 01/25/2016 1223   PROT 6.8 03/09/2015 1333   ALBUMIN 4.1 01/25/2016 1223   ALBUMIN 4.2 03/09/2015 1333   AST 24 01/25/2016 1223   ALT 20 01/25/2016 1223   ALKPHOS 55 01/25/2016 1223   BILITOT 0.6 01/25/2016 1223   BILITOT 0.5 03/09/2015 1333   GFRNONAA 59 (L) 03/29/2016 1330   GFRAA >60 03/29/2016 1330       Component Value Date/Time   WBC 9.0 03/29/2016 1330   WBC 9.2 01/25/2016 1223   WBC 6.7 12/12/2015 1737   HGB 13.1 03/29/2016 1330   HGB 13.0 01/25/2016 1223   HGB 13.7 12/12/2015 1737   HCT 40.3 03/29/2016 1330   HCT 38.8 01/25/2016 1223   HCT 42.1 12/12/2015 1737   HCT 41.0 03/09/2015 1333   MCV 88.2 03/29/2016 1330   MCV 85.8 01/25/2016 1223   MCV 87.5 12/12/2015 1737   MCV 84 03/09/2015 1333    Lipid Panel     Component Value Date/Time   CHOL 210 (H) 03/21/2013 0843   TRIG 184 (H) 03/21/2013 0843   HDL 57 03/21/2013 0843   CHOLHDL 3.7 03/21/2013 0843   VLDL 37 03/21/2013 0843   LDLCALC  116 (H) 03/21/2013 0843    ABG No results found for: PHART, PCO2ART, PO2ART, HCO3, TCO2, ACIDBASEDEF, O2SAT   Lab Results  Component Value Date   TSH 3.660 01/08/2014   BNP (last 3 results) No results for input(s): BNP in the last 8760 hours.  ProBNP (last 3 results) No results  for input(s): PROBNP in the last 8760 hours.  Cardiac Panel (last 3 results) No results for input(s): CKTOTAL, CKMB, TROPONINI, RELINDX in the last 72 hours.  Iron/TIBC/Ferritin/ %Sat No results found for: IRON, TIBC, FERRITIN, IRONPCTSAT   EKG Orders placed or performed in visit on 04/18/16  . EKG 12-Lead     Prior Assessment and Plan Problem List as of 04/18/2016 Reviewed: 03/25/2016  9:41 AM by Carlyle Dolly, MD     Cardiovascular and Mediastinum   Atrial fibrillation with RVR ([place])   Atrial fibrillation ([place])     Digestive   GERD (gastroesophageal reflux disease)     Endocrine   Impaired fasting glucose     Nervous and Auditory   Sciatica of right side   Neuropathy ([facility])     Musculoskeletal and Integument   Squamous cell skin cancer   Osteoarthritis of right knee     Genitourinary   Ovarian tumor     Other   Anxiety   Chronic anticoagulation   Long term (current) use of anticoagulants   Ventral hernia   Major depression   Major depression in partial remission ([place])       Imaging: No results found.

## 2016-04-18 NOTE — Progress Notes (Signed)
Cardiology Office Note   Date:  04/18/2016   ID:  [name], DOB 1923/06/05, MRN [id number]  PCP:  Sallee Lange, MD  Cardiologist: Cloria Spring, NP   Chief Complaint  Patient presents with  . Atrial Fibrillation    S/P DCCV  . Hypertension      History of Present Illness: [name] is a 82 y.o. female who presents for ongoing assessment and management of symptomatic atrial flutter, chronic lower extremity edema, status post direct current cardioversion by Dr. Jenkins Rouge on 03/31/2016, converted to normal sinus rhythm with 120 J with a rate of 78 bpm.  She comes today without any cardiac complaints. She has chronic back pain and leg pain, and is somewhat unstable on her feet. She does use a cane when she is out but uses a walker at home or uses furniture for support. This is becoming more concerning to her. She remains medically compliant and has not had any falls or stumbling recently. This is a concern as she is on Xarelto. She denies any bleeding or excessive bruising.   Past Medical History:  Diagnosis Date  . Abdominal pain, chronic, right lower quadrant    "ever since I had my appendix removed"  . Atrial fibrillation ([place])   . Cancer ([place])    left side  . Chronic back pain   . Edema   . GERD (gastroesophageal reflux disease)   . Hyperlipidemia   . Hypertension   . Osteopenia   . Osteoporosis   . Ovarian cyst 02/2015   left  . Recurrent abdominal pain    LLQ    Past Surgical History:  Procedure Laterality Date  . APPENDECTOMY    . APPENDECTOMY    . BREAST SURGERY    . CARDIOVERSION N/A 03/31/2016   Procedure: CARDIOVERSION;  Surgeon: Josue Hector, MD;  Location: [facility];  Service: Cardiovascular;  Laterality: N/A;  . COLON RESECTION    . COLON SURGERY    . HERNIA REPAIR    . HERNIA REPAIR       Current Outpatient Prescriptions  Medication Sig Dispense Refill  . acetaminophen (TYLENOL) 500 MG tablet Take 500 mg by mouth every 6  (six) hours as needed for mild pain or moderate pain.    Marland Kitchen ALPRAZolam (XANAX) 0.5 MG tablet Take 1-2 tablets (0.5-1 mg total) by mouth at bedtime as needed for anxiety. (Patient taking differently: Take 0.25-1 mg by mouth at bedtime as needed for sleep. ) 30 tablet 5  . amiodarone (PACERONE) 200 MG tablet Take 200mg  Two Times Daily for 1 month, then Change to 200mg  Daily (Patient taking differently: Take 200 mg by mouth daily. ) 90 tablet 3  . diltiazem (CARDIZEM CD) 180 MG 24 hr capsule Take 180 mg by mouth daily.    . DULoxetine (CYMBALTA) 20 MG capsule Take two every day (Patient taking differently: Take 40 mg by mouth daily. ) 60 capsule 5  . furosemide (LASIX) 20 MG tablet Take 1 tablet (20 mg total) by mouth daily. (Patient taking differently: Take 20 mg by mouth daily as needed for fluid or edema. ) 90 tablet 3  . HYDROcodone-acetaminophen (NORCO/VICODIN) 5-325 MG tablet 1/2 tablet every 6 hours as needed-caution drowsiness (Patient taking differently: Take 0.5 tablets by mouth every 6 (six) hours as needed for moderate pain. ) 30 tablet 0  . meloxicam (MOBIC) 7.5 MG tablet Take 1 tablet (7.5 mg total) by mouth daily as needed for pain. 4 tablet  0  . potassium chloride SA (K-DUR,KLOR-CON) 20 MEQ tablet Take 1 tablet (20 mEq total) by mouth as needed (on days taking Furosemide). (Patient taking differently: Take 20 mEq by mouth daily as needed (on days taking Furosemide). ) 90 tablet 3  . rivaroxaban (XARELTO) 20 MG TABS tablet Take 1 tablet (20 mg total) by mouth daily with supper. 30 tablet 6   No current facility-administered medications for this visit.     Allergies:   Amiodarone; Amoxicillin; Cephalosporins; Lipitor [atorvastatin]; Lorazepam; Lovenox [enoxaparin sodium]; Morphine and related; Penicillins; and Sulfa antibiotics    Social History:  The patient  reports that she has never smoked. She has never used smokeless tobacco. She reports that she does not drink alcohol or use drugs.    Family History:  The patient's family history includes Diabetes in her mother; Hyperlipidemia in her brother.    ROS: All other systems are reviewed and negative. Unless otherwise mentioned in H&P    PHYSICAL EXAM: VS:  BP (!) 150/72   Pulse 84   Ht 5\' 6"  (1.676 m)   Wt 156 lb (70.8 kg)   SpO2 94%   BMI 25.18 kg/m  , BMI Body mass index is 25.18 kg/m. GEN: Well nourished, well developed, in no acute distress  HEENT: normal  Neck: no JVD, carotid bruits, or masses Cardiac: RRR; no murmurs, rubs, or gallops,no edema  Respiratory:  clear to auscultation bilaterally, normal work of breathing GI: soft, nontender, nondistended, + BS MS: no deformity or atrophy  Skin: warm and dry, no rash Neuro:  Strength and sensation are intact Psych: euthymic mood, full affect   EKG:  The ekg ordered today demonstrates normal sinus rhythm with first-degree AV block, PR interval 0.226 ms. Heart rate 81 bpm. Low voltage in the precordial leads is noted.   Recent Labs: 01/25/2016: ALT 20 03/29/2016: BUN 18; Creatinine, Ser 0.85; Hemoglobin 13.1; Platelets 275; Potassium 3.4; Sodium 135    Lipid Panel    Component Value Date/Time   CHOL 210 (H) 03/21/2013 0843   TRIG 184 (H) 03/21/2013 0843   HDL 57 03/21/2013 0843   CHOLHDL 3.7 03/21/2013 0843   VLDL 37 03/21/2013 0843   LDLCALC 116 (H) 03/21/2013 0843      Wt Readings from Last 3 Encounters:  04/18/16 156 lb (70.8 kg)  04/11/16 160 lb (72.6 kg)  03/29/16 161 lb (73 kg)     ASSESSMENT AND PLAN:  1. Atrial fibrillation: Status post direct current cardioversion by Dr. Johnsie Cancel. She remains in normal sinus rhythm. I will continue amiodarone and diltiazem. We'll also continue Xarelto for another 6 months. If she begins to fall or become more unstable on her feet may consider taking her off anticoagulation, at 6 months we may consider taking her off of it altogether if she remains in normal sinus rhythm.  2. Hypertension: Elevated today  in the office. She states that it is much better at home. Per ACC guidelines blood pressure is not currently well-controlled. Will monitor her a little longer if it remains elevated may need to have an ambulatory blood pressure evaluation, before adding additional medications or adjusting diltiazem.  3. Chronic lower extremity edema: She is no longer taking furosemide or potassium. She has a follow-up appointment with Dr. Wolfgang Phoenix next week. He artery plans to draw labs. We'll request a copy.   Current medicines are reviewed at length with the patient today.    Labs/ tests ordered today include:   Orders Placed This Encounter  Procedures  . EKG 12-Lead     Disposition:   FU with 6 months Signed, Jory Sims, NP  04/18/2016 4:11 PM    [facility] [address] [place], [place] [address] Phone: [phone]; Fax: [phone]

## 2016-04-19 ENCOUNTER — Encounter: Payer: Self-pay | Admitting: Family Medicine

## 2016-04-19 ENCOUNTER — Ambulatory Visit (INDEPENDENT_AMBULATORY_CARE_PROVIDER_SITE_OTHER): Payer: Medicare Other | Admitting: Family Medicine

## 2016-04-19 VITALS — BP 136/84 | Ht 66.0 in | Wt 156.0 lb

## 2016-04-19 DIAGNOSIS — E876 Hypokalemia: Secondary | ICD-10-CM

## 2016-04-19 NOTE — Progress Notes (Signed)
   Subjective:    Patient ID: [name], female    DOB: [birth year], 82 y.o.   MRN: [id number]  HPI Patient arrives for a follow up from a recent ER visit. Patient was seen for back pain and side pain. Patient was seen in ER please see ER notes these were reviewed Patient also seen recently by cardiology blood pressure slightly elevated She denies chest tightness pressure pain shortness breath denies dizziness falls or injuries. She relates compliance with all of her medicine states her moods are doing good.  Review of Systems Please see above.    Objective:   Physical Exam Heart irregular but rate control blood pressure good overall on several checks including sitting and standing extremities no edema low back subjective discomfort  Recent lab work shows low potassium     Assessment & Plan:  Hypokalemia-patient will go ahead and check metabolic 7 await the results of this  Low back pain patient states is actually under better control  Patient's blood pressure mildly elevated when she went cardiology now blood pressure looks better.  Follow-up in March

## 2016-04-20 ENCOUNTER — Encounter: Payer: Self-pay | Admitting: Family Medicine

## 2016-04-20 LAB — BASIC METABOLIC PANEL
BUN/Creatinine Ratio: 24 (ref 12–28)
BUN: 18 mg/dL (ref 10–36)
CALCIUM: 9.7 mg/dL (ref 8.7–10.3)
CHLORIDE: 101 mmol/L (ref 96–106)
CO2: 26 mmol/L (ref 18–29)
Creatinine, Ser: 0.75 mg/dL (ref 0.57–1.00)
GFR calc Af Amer: 81 mL/min/{1.73_m2} (ref >59)
GFR calc non Af Amer: 70 mL/min/{1.73_m2} (ref >59)
GLUCOSE: 87 mg/dL (ref 65–99)
POTASSIUM: 4 mmol/L (ref 3.5–5.2)
SODIUM: 142 mmol/L (ref 134–144)

## 2016-05-02 ENCOUNTER — Telehealth: Payer: Self-pay | Admitting: Family Medicine

## 2016-05-02 ENCOUNTER — Other Ambulatory Visit: Payer: Self-pay | Admitting: Family Medicine

## 2016-05-02 NOTE — Telephone Encounter (Signed)
Please change prescription to stay may take one half to 1 daily at bedtime when necessary insomnia #30, 5 refills

## 2016-05-02 NOTE — Telephone Encounter (Signed)
ERROR

## 2016-05-17 ENCOUNTER — Telehealth: Payer: Self-pay | Admitting: Family Medicine

## 2016-05-17 ENCOUNTER — Other Ambulatory Visit: Payer: Self-pay | Admitting: *Deleted

## 2016-05-17 MED ORDER — CIPROFLOXACIN HCL 250 MG PO TABS: 250.0000 mg | ORAL_TABLET | Freq: Two times a day (BID) | ORAL | 0 refills | Status: DC

## 2016-05-17 NOTE — Telephone Encounter (Signed)
Pt states she is having low abd pain and low back pain like she always does when she has a UTI. No fever, no burning with urination. Having frequency.

## 2016-05-17 NOTE — Telephone Encounter (Signed)
Patient believes she has a UTI.  She is not feeling very well and she was in and out of the bathroom all night.  She says that it would be very difficult for her to come in the office because she can barely walk.  She wants to know if we can prescribe her Rx without being seen like we have done in the past?   [name]

## 2016-05-17 NOTE — Telephone Encounter (Signed)
Med sent to pharm. Pt notified.  

## 2016-05-17 NOTE — Telephone Encounter (Signed)
Cipro 250 mg tablet 1 twice a day for 5 days follow-up if ongoing troubles, fever, chills, worsening illness

## 2016-06-01 ENCOUNTER — Emergency Department (HOSPITAL_COMMUNITY)
Admission: EM | Admit: 2016-06-01 | Discharge: 2016-06-01 | Disposition: A | Discharge status: Home / Self Care | Payer: Medicare Other | Attending: Emergency Medicine | Admitting: Emergency Medicine

## 2016-06-01 ENCOUNTER — Emergency Department (HOSPITAL_COMMUNITY): Payer: Medicare Other

## 2016-06-01 ENCOUNTER — Encounter (HOSPITAL_COMMUNITY): Payer: Self-pay | Admitting: Emergency Medicine

## 2016-06-01 ENCOUNTER — Telehealth: Payer: Self-pay | Admitting: Pediatrics

## 2016-06-01 DIAGNOSIS — S3992XA Unspecified injury of lower back, initial encounter: Secondary | ICD-10-CM | POA: Diagnosis present

## 2016-06-01 DIAGNOSIS — M545 Low back pain, unspecified: Secondary | ICD-10-CM

## 2016-06-01 DIAGNOSIS — W1839XA Other fall on same level, initial encounter: Secondary | ICD-10-CM | POA: Insufficient documentation

## 2016-06-01 DIAGNOSIS — Y929 Unspecified place or not applicable: Secondary | ICD-10-CM | POA: Insufficient documentation

## 2016-06-01 DIAGNOSIS — I10 Essential (primary) hypertension: Secondary | ICD-10-CM | POA: Insufficient documentation

## 2016-06-01 DIAGNOSIS — Y939 Activity, unspecified: Secondary | ICD-10-CM | POA: Diagnosis not present

## 2016-06-01 DIAGNOSIS — Y999 Unspecified external cause status: Secondary | ICD-10-CM | POA: Insufficient documentation

## 2016-06-01 DIAGNOSIS — Z79899 Other long term (current) drug therapy: Secondary | ICD-10-CM | POA: Insufficient documentation

## 2016-06-01 DIAGNOSIS — M25552 Pain in left hip: Secondary | ICD-10-CM | POA: Diagnosis not present

## 2016-06-01 DIAGNOSIS — Z85828 Personal history of other malignant neoplasm of skin: Secondary | ICD-10-CM | POA: Diagnosis not present

## 2016-06-01 NOTE — Telephone Encounter (Signed)
Since per family pain dr Nicki Reaper talked with her about in November disc warning signs with family and if worsens will have to go to ER for further testing, , send this messae to dr scott to see tomorrow to respond

## 2016-06-01 NOTE — ED Provider Notes (Signed)
[facility] DEPT Provider Note   CSN: [id number] Arrival date & time: 06/01/16  1625     History   Chief Complaint Chief Complaint  Patient presents with  . Back Pain    HPI [name] is a 83 y.o. female.  HPI Patient presents with pain in her left lower back/upper pelvic area. Began around 6 weeks ago when she had a fall. States she slid down and landed on her rear ended her head. She is on Coumadin. Pain is been there since. No numbness or weakness. Pain is worse with movement. States has gotten a little worse. No loss of bladder bowel control. Pain is more in her pelvis and in her back. No abdominal pain. No fevers. No dysuria.   Past Medical History:  Diagnosis Date  . Abdominal pain, chronic, right lower quadrant    "ever since I had my appendix removed"  . Atrial fibrillation ([place])   . Cancer ([place])    left side  . Chronic back pain   . Edema   . GERD (gastroesophageal reflux disease)   . Hyperlipidemia   . Hypertension   . Osteopenia   . Osteoporosis   . Ovarian cyst 02/2015   left  . Recurrent abdominal pain    LLQ    Patient Active Problem List   Diagnosis Date Noted  . Major depression in partial remission ([place]) 03/17/2016  . Atrial fibrillation ([place]) 01/12/2016  . Osteoarthritis of right knee 12/15/2015  . Major depression 09/22/2015  . Ventral hernia 05/21/2015  . Ovarian tumor 04/13/2015  . Squamous cell skin cancer 08/13/2014  . Impaired fasting glucose 08/05/2013  . Neuropathy ([place]) 01/11/2013  . Long term (current) use of anticoagulants 07/27/2012  . Atrial fibrillation with RVR ([place]) 01/29/2012  . Anxiety 01/29/2012  . GERD (gastroesophageal reflux disease) 01/29/2012  . Chronic anticoagulation 01/29/2012  . Sciatica of right side 05/10/2011    Past Surgical History:  Procedure Laterality Date  . APPENDECTOMY    . APPENDECTOMY    . BREAST SURGERY    . CARDIOVERSION N/A 03/31/2016   Procedure: CARDIOVERSION;  Surgeon: Josue Hector, MD;  Location: [facility];  Service: Cardiovascular;  Laterality: N/A;  . COLON RESECTION    . COLON SURGERY    . HERNIA REPAIR    . HERNIA REPAIR      OB History    Gravida Para Term Preterm AB Living   2 1     1 1    SAB TAB Ectopic Multiple Live Births   1               Home Medications    Prior to Admission medications   Medication Sig Start Date End Date Taking? Authorizing Provider  acetaminophen (TYLENOL) 500 MG tablet Take 500 mg by mouth every 6 (six) hours as needed for mild pain or moderate pain.   Yes Historical Provider, MD  ALPRAZolam Duanne Moron) 0.5 MG tablet Take one half to 1 tablet daily at bedtime when necessary insomnia 05/03/16  Yes [name], MD  amiodarone (PACERONE) 200 MG tablet Take 200mg  Two Times Daily for 1 month, then Change to 200mg  Daily Patient taking differently: Take 200 mg by mouth daily.  11/11/15  Yes Arnoldo Lenis, MD  diltiazem (CARDIZEM CD) 180 MG 24 hr capsule Take 180 mg by mouth daily.   Yes Historical Provider, MD  DULoxetine (CYMBALTA) 20 MG capsule Take two every day Patient taking differently: Take 40 mg by mouth  daily.  01/12/16  Yes [name], MD  furosemide (LASIX) 20 MG tablet Take 1 tablet (20 mg total) by mouth daily. Patient taking differently: Take 20 mg by mouth daily as needed for fluid or edema.  11/09/15  Yes Arnoldo Lenis, MD  rivaroxaban (XARELTO) 20 MG TABS tablet Take 1 tablet (20 mg total) by mouth daily with supper. 03/29/16  Yes Josue Hector, MD  HYDROcodone-acetaminophen (NORCO/VICODIN) 5-325 MG tablet 1/2 tablet every 6 hours as needed-caution drowsiness Patient not taking: Reported on 06/01/2016 12/15/15   [name], MD  potassium chloride SA (K-DUR,KLOR-CON) 20 MEQ tablet Take 1 tablet (20 mEq total) by mouth as needed (on days taking Furosemide). Patient not taking: Reported on 06/01/2016 03/29/16   Josue Hector, MD    Family History Family History  Problem Relation Age of Onset  .  Diabetes Mother   . Heart disease    . Arthritis    . Cancer    . Hyperlipidemia Brother     Social History Social History  Substance Use Topics  . Smoking status: Never Smoker  . Smokeless tobacco: Never Used  . Alcohol use No     Allergies   Amiodarone; Amoxicillin; Cephalosporins; Lipitor [atorvastatin]; Lorazepam; Lovenox [enoxaparin sodium]; Morphine and related; Penicillins; and Sulfa antibiotics   Review of Systems Review of Systems  Constitutional: Negative for fever.  HENT: Negative for facial swelling.   Eyes: Negative for redness.  Respiratory: Negative for shortness of breath.   Cardiovascular: Negative for chest pain.  Gastrointestinal: Negative for abdominal pain.  Endocrine: Negative for polyuria.  Genitourinary: Negative for flank pain.  Musculoskeletal: Positive for back pain. Negative for neck pain.  Skin: Negative for rash.  Neurological: Negative for weakness and numbness.     Physical Exam Updated Vital Signs BP 173/78 (BP Location: Right Arm)   Pulse 84   Temp 98.9 F (37.2 C) (Oral)   Resp 20   Ht 5\' 4"  (1.626 m)   Wt 155 lb (70.3 kg)   SpO2 93%   BMI 26.61 kg/m   Physical Exam  Constitutional: She appears well-developed.  HENT:  Head: Atraumatic.  Eyes: EOM are normal.  Neck: Neck supple.  Cardiovascular: Normal rate.   Pulmonary/Chest: Effort normal.  Abdominal: Soft. There is no guarding.  Musculoskeletal: She exhibits tenderness.  Mild tenderness on left posterior pelvis area. Near the SI area. No deformity. No lumbar tenderness. No pain with straight leg raise bilaterally. Sensation intact both feet.  Neurological: She is alert.  Skin: Skin is warm. Capillary refill takes less than 2 seconds.     ED Treatments / Results  Labs (all labs ordered are listed, but only abnormal results are displayed) Labs Reviewed - No data to display  EKG  EKG Interpretation None       Radiology Dg Hip Unilat W Or Wo Pelvis 2-3  Views Left  Result Date: 06/01/2016 CLINICAL DATA:  Left hip pain for 1 month radiating down the left leg EXAM: DG HIP (WITH OR WITHOUT PELVIS) 2-3V LEFT COMPARISON:  05/19/2015 FINDINGS: The SI joints appear symmetric bilaterally. No dislocation or fracture is visualized. Pubic symphysis appears intact. IMPRESSION: No acute osseous abnormality Electronically Signed   By: Donavan Foil M.D.   On: 06/01/2016 20:49    Procedures Procedures (including critical care time)  Medications Ordered in ED Medications - No data to display   Initial Impression / Assessment and Plan / ED Course  I have reviewed the  triage vital signs and the nursing notes.  Pertinent labs & imaging results that were available during my care of the patient were reviewed by me and considered in my medical decision making (see chart for details).  Clinical Course     Patient with low back/pelvic pain since fall 6 weeks ago. Continued pain. Overall benign exam. No pain with straight leg raise. No abdominal pain. No lower lumbar tenderness. X-rays reassuring. Will discharge home.  Final Clinical Impressions(s) / ED Diagnoses   Final diagnoses:  Acute left-sided low back pain without sciatica    New Prescriptions New Prescriptions   No medications on file     Davonna Belling, MD 06/01/16 2140

## 2016-06-01 NOTE — Telephone Encounter (Signed)
Please advise 

## 2016-06-01 NOTE — Telephone Encounter (Signed)
Patient's cousin, Izora Gala callled and request we call pt to speak with her about her symptoms.  She states pt is in pain.  I called patient and spoke with her.  She c/o left side lumbago that radiates to LLQ and LUQ.  She denies fever, chills, dysuria, N/V, and issues with BMs.  She states this is the same pain discussed with Dr. Nicki Reaper at last appointment.  She reports Extra Strength APAP and sitting help some.  She states she does not want to call ambulance because it will cost $100.  Please advise.

## 2016-06-01 NOTE — ED Triage Notes (Addendum)
Pt reports falling one month ago straight back, reports having a knot on her head that is no longer present, on blood thinners.   Pt came to the ED today for lower back pain radiating down left leg.  Pt denies all urinary symptoms at this time, although she just recently completed course of antibiotics for UTI

## 2016-06-01 NOTE — ED Notes (Signed)
Patient transported to X-ray 

## 2016-06-01 NOTE — ED Notes (Signed)
Returned from xray

## 2016-06-01 NOTE — ED Notes (Signed)
Inquired about CT head for pt due to pt being on blood thinners.  Dr. Alvino Chapel notified but does not want scan at this time.

## 2016-06-01 NOTE — ED Notes (Signed)
Pt not in room to receive discharge instructions.  Instructions left at charge nurse desk in case the pt returns

## 2016-07-15 ENCOUNTER — Encounter: Payer: Self-pay | Admitting: Family Medicine

## 2016-07-15 ENCOUNTER — Ambulatory Visit (INDEPENDENT_AMBULATORY_CARE_PROVIDER_SITE_OTHER): Payer: Medicare Other | Admitting: Family Medicine

## 2016-07-15 VITALS — Ht 66.0 in | Wt 156.0 lb

## 2016-07-15 DIAGNOSIS — I482 Chronic atrial fibrillation, unspecified: Secondary | ICD-10-CM

## 2016-07-15 DIAGNOSIS — R0600 Dyspnea, unspecified: Secondary | ICD-10-CM

## 2016-07-15 DIAGNOSIS — M545 Low back pain, unspecified: Secondary | ICD-10-CM

## 2016-07-15 DIAGNOSIS — R0609 Other forms of dyspnea: Secondary | ICD-10-CM

## 2016-07-15 DIAGNOSIS — R600 Localized edema: Secondary | ICD-10-CM | POA: Diagnosis not present

## 2016-07-15 DIAGNOSIS — N83202 Unspecified ovarian cyst, left side: Secondary | ICD-10-CM | POA: Diagnosis not present

## 2016-07-15 MED ORDER — HYDROCODONE-ACETAMINOPHEN 5-325 MG PO TABS: ORAL_TABLET | ORAL | 0 refills | Status: DC

## 2016-07-15 NOTE — Patient Instructions (Signed)
#  1 it is important to do your bloodwork. Please due this we will let you know the results  #2 please take furosemide each morning 20 mg once each morning this will help with the swelling  #3 the new potassium 10 mEq, take 1 twice daily  #4 we have ordered a CT scan of your lumbar spine to help see if there may be a compression fracture in the spine that is causing your pain  #5 I would like to see you back in 2 months  #6 pain medicine-hydrocodone-you may use one half tablet every 4-6 hours as needed for severe pain it is best used this in the evening. Using a half of a tablet should prevent any excessive drowsiness

## 2016-07-15 NOTE — Progress Notes (Signed)
   Subjective:    Patient ID: [name], female    DOB: [birth year], 83 y.o.   MRN: [id number]  HPI  Patient arrives with c/o swelling in feet.- also having pain retated to recent fall Patient fell is been having pain in the lower back worse on the left side she's been to the ER she's been to our office couple times plain x-rays of been negative.  The swelling in the feet over the past couple weeks she relates she has not been taking the diuretic she denies chest pressure tightness pain shortness of breath  She is frustrated by her weakness as well as the discomfort She was in the ER they did a CAT scan the left ovarian growth was slightly bigger but not severely bigger. She has seen gynecologist before they did see a 125 and it was normal Review of Systems Relates some weakness relates some swelling in the legs at times gets a little short winded also has left flank pain and discomfort    Objective:   Physical Exam Heart irregular rate controlled lungs are clear no crackles pulse normal 1-2+ pitting edema in the lower legs patient has a fair 10 for her age but has some difficulty getting out of a chair She describes the pain is in her left lower back she points to the left lower lumbar area radiates into the left buttock region.  25 minutes was spent with the patient. Greater than half the time was spent in discussion and answering questions and counseling regarding the issues that the patient came in for today.     Assessment & Plan:  Left ovary cyst has gotten a little bit larger check CA 125 await the results I doubt a cancer saw gynecologist back in November 2016  Significant low back pain men present for several months after some falls x-rays were negative CT scan without contrast recommended to rule out the possibility of compression fracture  Pedal edema could be related to atrial fibrillation heart related condition has not been taking her diuretic sure reinitiate the  diuretic along with potassium  Slight dyspnea check BMP to make sure there is not congestive heart failure going on  Follow-up in 2 months

## 2016-07-18 ENCOUNTER — Ambulatory Visit: Payer: Medicare Other | Admitting: Family Medicine

## 2016-07-22 ENCOUNTER — Ambulatory Visit (HOSPITAL_COMMUNITY)
Admission: RE | Admit: 2016-07-22 | Discharge: 2016-07-22 | Disposition: A | Discharge status: Home / Self Care | Payer: Medicare Other | Source: Ambulatory Visit | Attending: Family Medicine | Admitting: Family Medicine

## 2016-07-22 DIAGNOSIS — M48061 Spinal stenosis, lumbar region without neurogenic claudication: Secondary | ICD-10-CM | POA: Diagnosis not present

## 2016-07-22 DIAGNOSIS — M545 Low back pain: Secondary | ICD-10-CM | POA: Diagnosis not present

## 2016-07-22 DIAGNOSIS — M4316 Spondylolisthesis, lumbar region: Secondary | ICD-10-CM | POA: Insufficient documentation

## 2016-08-02 ENCOUNTER — Encounter (HOSPITAL_COMMUNITY): Payer: Self-pay | Admitting: Emergency Medicine

## 2016-08-02 ENCOUNTER — Emergency Department (HOSPITAL_COMMUNITY)
Admission: EM | Admit: 2016-08-02 | Discharge: 2016-08-02 | Disposition: A | Discharge status: Home / Self Care | Payer: Medicare Other | Attending: Emergency Medicine | Admitting: Emergency Medicine

## 2016-08-02 ENCOUNTER — Ambulatory Visit: Payer: Medicare Other | Admitting: Family Medicine

## 2016-08-02 DIAGNOSIS — Z7901 Long term (current) use of anticoagulants: Secondary | ICD-10-CM | POA: Diagnosis not present

## 2016-08-02 DIAGNOSIS — Z85828 Personal history of other malignant neoplasm of skin: Secondary | ICD-10-CM | POA: Insufficient documentation

## 2016-08-02 DIAGNOSIS — M25552 Pain in left hip: Secondary | ICD-10-CM | POA: Diagnosis not present

## 2016-08-02 DIAGNOSIS — I10 Essential (primary) hypertension: Secondary | ICD-10-CM | POA: Insufficient documentation

## 2016-08-02 DIAGNOSIS — Z79899 Other long term (current) drug therapy: Secondary | ICD-10-CM | POA: Insufficient documentation

## 2016-08-02 DIAGNOSIS — M25559 Pain in unspecified hip: Secondary | ICD-10-CM | POA: Diagnosis not present

## 2016-08-02 DIAGNOSIS — N3000 Acute cystitis without hematuria: Secondary | ICD-10-CM | POA: Diagnosis not present

## 2016-08-02 DIAGNOSIS — M545 Low back pain: Secondary | ICD-10-CM | POA: Diagnosis present

## 2016-08-02 LAB — URINALYSIS, ROUTINE W REFLEX MICROSCOPIC
Bilirubin Urine: NEGATIVE
Glucose, UA: NEGATIVE mg/dL
Ketones, ur: NEGATIVE mg/dL
Nitrite: POSITIVE — AB
PH: 7 (ref 5.0–8.0)
Protein, ur: 30 mg/dL — AB
SPECIFIC GRAVITY, URINE: 1.017 (ref 1.005–1.030)

## 2016-08-02 MED ORDER — HYDROCODONE-ACETAMINOPHEN 5-325 MG PO TABS
1.0000 | ORAL_TABLET | Freq: Once | ORAL | Status: AC
  Administered 2016-08-02: 1 via ORAL
  Filled 2016-08-02: qty 1

## 2016-08-02 MED ORDER — TRAMADOL HCL 50 MG PO TABS: 50.0000 mg | ORAL_TABLET | Freq: Four times a day (QID) | ORAL | 0 refills | Status: DC | PRN

## 2016-08-02 MED ORDER — CIPROFLOXACIN HCL 500 MG PO TABS: 500.0000 mg | ORAL_TABLET | Freq: Two times a day (BID) | ORAL | 0 refills | Status: DC

## 2016-08-02 NOTE — Discharge Instructions (Signed)
Please follow-up with Dr. Wolfgang Phoenix regarding management of your pain. You do have a UTI today. Please take antibiotics as prescribed. Return for worsening symptoms, including fever, vomiting, escalating pain or any other symptoms concerning to you.

## 2016-08-02 NOTE — ED Triage Notes (Signed)
Patient complains of left hip pain. States chronic left hip pain that has gotten worse. Denies fall or injury.

## 2016-08-02 NOTE — ED Provider Notes (Addendum)
[facility] DEPT Provider Note   CSN: [id number] Arrival date & time: 08/02/16  1120   By signing my name below, I, Hilbert Odor, attest that this documentation has been prepared under the direction and in the presence of Forde Dandy, MD. Electronically Signed: Hilbert Odor, Scribe. 08/02/16. 3:32 PM. History   Chief Complaint Chief Complaint  Patient presents with  . Hip Pain    The history is provided by the patient. No language interpreter was used.  HPI Comments: [name] is a 83 y.o. female brought in by ambulance, who presents to the Emergency Department complaining of worsening left hip and lower back pain for the past week. States that pain is chronic and has been ongoing for a long time. She states that the pain worsens when she walks and sometimes radiates down her legs. She reports some nausea yesterday which has improved. She believes her nausea is due to the pain. She has taken extra strength tylenol this morning without any significant relief. She also reports increased urinary frequency. The patient is unaware of any fevers. She denies any recent fall or injuries. She has a hx of chronic left hip pain. She has an appointment scheduled today at 1:30 pm with her PCP, Dr. Wolfgang Phoenix. No focal numbness or weakness, no urinary retention, no bowel incontinence.   Past Medical History:  Diagnosis Date  . Abdominal pain, chronic, right lower quadrant    "ever since I had my appendix removed"  . Atrial fibrillation ([place])   . Cancer ([place])    left side  . Chronic back pain   . Edema   . GERD (gastroesophageal reflux disease)   . Hyperlipidemia   . Hypertension   . Osteopenia   . Osteoporosis   . Ovarian cyst 02/2015   left  . Recurrent abdominal pain    LLQ    Patient Active Problem List   Diagnosis Date Noted  . Major depression in partial remission ([place]) 03/17/2016  . Atrial fibrillation ([place]) 01/12/2016  . Osteoarthritis of right knee 12/15/2015  .  Major depression 09/22/2015  . Ventral hernia 05/21/2015  . Ovarian tumor 04/13/2015  . Squamous cell skin cancer 08/13/2014  . Impaired fasting glucose 08/05/2013  . Neuropathy ([place]) 01/11/2013  . Long term (current) use of anticoagulants 07/27/2012  . Atrial fibrillation with RVR ([place]) 01/29/2012  . Anxiety 01/29/2012  . GERD (gastroesophageal reflux disease) 01/29/2012  . Chronic anticoagulation 01/29/2012  . Sciatica of right side 05/10/2011    Past Surgical History:  Procedure Laterality Date  . APPENDECTOMY    . APPENDECTOMY    . BREAST SURGERY    . CARDIOVERSION N/A 03/31/2016   Procedure: CARDIOVERSION;  Surgeon: Josue Hector, MD;  Location: [facility];  Service: Cardiovascular;  Laterality: N/A;  . COLON RESECTION    . COLON SURGERY    . HERNIA REPAIR    . HERNIA REPAIR      OB History    Gravida Para Term Preterm AB Living   2 1     1 1    SAB TAB Ectopic Multiple Live Births   1               Home Medications    Prior to Admission medications   Medication Sig Start Date End Date Taking? Authorizing Provider  acetaminophen (TYLENOL) 500 MG tablet Take 500 mg by mouth every 6 (six) hours as needed for mild pain or moderate pain.   Yes Historical Provider, MD  ALPRAZolam (XANAX) 0.5 MG tablet Take one half to 1 tablet daily at bedtime when necessary insomnia 05/03/16  Yes [name], MD  amiodarone (PACERONE) 200 MG tablet Take 200mg  Two Times Daily for 1 month, then Change to 200mg  Daily Patient taking differently: Take 200 mg by mouth daily.  11/11/15  Yes Arnoldo Lenis, MD  diltiazem (CARDIZEM CD) 180 MG 24 hr capsule Take 180 mg by mouth daily.   Yes Historical Provider, MD  DULoxetine (CYMBALTA) 20 MG capsule Take two every day Patient taking differently: Take 40 mg by mouth daily.  01/12/16  Yes [name], MD  furosemide (LASIX) 20 MG tablet Take 1 tablet (20 mg total) by mouth daily. Patient taking differently: Take 20 mg by mouth daily as needed  for fluid or edema.  11/09/15  Yes Arnoldo Lenis, MD  HYDROcodone-acetaminophen (NORCO/VICODIN) 5-325 MG tablet 1/2 tablet every 6 hours as needed-caution drowsiness 07/15/16  Yes [name], MD  rivaroxaban (XARELTO) 20 MG TABS tablet Take 1 tablet (20 mg total) by mouth daily with supper. 03/29/16  Yes Josue Hector, MD  ciprofloxacin (CIPRO) 500 MG tablet Take 1 tablet (500 mg total) by mouth 2 (two) times daily. 08/02/16   Forde Dandy, MD  traMADol (ULTRAM) 50 MG tablet Take 1 tablet (50 mg total) by mouth every 6 (six) hours as needed for moderate pain or severe pain. 08/02/16   Forde Dandy, MD    Family History Family History  Problem Relation Age of Onset  . Diabetes Mother   . Heart disease    . Arthritis    . Cancer    . Hyperlipidemia Brother     Social History Social History  Substance Use Topics  . Smoking status: Never Smoker  . Smokeless tobacco: Never Used  . Alcohol use No     Allergies   Amiodarone; Amoxicillin; Cephalosporins; Lipitor [atorvastatin]; Lorazepam; Lovenox [enoxaparin sodium]; Morphine and related; Penicillins; and Sulfa antibiotics   Review of Systems Review of Systems  Gastrointestinal: Negative for nausea and vomiting.  Genitourinary: Positive for frequency and pelvic pain. Negative for difficulty urinating.  Musculoskeletal: Positive for back pain.  Neurological: Negative for weakness and numbness.  All other systems reviewed and are negative.  Physical Exam Updated Vital Signs BP 128/88 (BP Location: Left Arm)   Pulse 90   Temp 98.7 F (37.1 C) (Oral)   Resp 16   Ht 5\' 6"  (1.676 m)   Wt 156 lb (70.8 kg)   SpO2 96%   BMI 25.18 kg/m   Physical Exam Physical Exam  Nursing note and vitals reviewed. Constitutional: Well developed, well nourished, non-toxic, and in no acute distress Head: Normocephalic and atraumatic.  Mouth/Throat: Oropharynx is clear and moist.  Neck: Normal range of motion. Neck supple.  Cardiovascular:  Normal rate and regular rhythm.   Pulmonary/Chest: Effort normal and breath sounds normal.  Abdominal: Soft. There is no tenderness. There is no rebound and no guarding.  Musculoskeletal: Normal range of motion of left LE w/o deformities or bruising or swelling. mild tenderness to lateral aspect of the left hip Neurological: Alert, no facial droop, fluent speech, moves all extremities symmetrically, full strength in bilateral lower extremities, full sensation in bilateral lower extremities to light touch Skin: Skin is warm and dry.  Psychiatric: Cooperative   ED Treatments / Results  DIAGNOSTIC STUDIES: Oxygen Saturation is 96% on RA, adequate by my interpretation.    COORDINATION OF CARE: 12:08 PM Discussed  treatment plan with pt at bedside and pt agreed to plan. I will check the patients left hip x-ray.  Labs (all labs ordered are listed, but only abnormal results are displayed) Labs Reviewed  URINALYSIS, ROUTINE W REFLEX MICROSCOPIC - Abnormal; Notable for the following:       Result Value   APPearance CLOUDY (*)    Hgb urine dipstick SMALL (*)    Protein, ur 30 (*)    Nitrite POSITIVE (*)    Leukocytes, UA LARGE (*)    Bacteria, UA FEW (*)    All other components within normal limits  URINE CULTURE    EKG  EKG Interpretation None       Radiology No results found.  Procedures Procedures (including critical care time)  Medications Ordered in ED Medications  HYDROcodone-acetaminophen (NORCO/VICODIN) 5-325 MG per tablet 1 tablet (1 tablet Oral Given 08/02/16 1422)     Initial Impression / Assessment and Plan / ED Course  I have reviewed the triage vital signs and the nursing notes.  Pertinent labs & imaging results that were available during my care of the patient were reviewed by me and considered in my medical decision making (see chart for details).    Presenting with worsening left hip and left lower back pain. Pain she reports is chronic. Records reviewed.  She has had multiple evaluations and imaging studies including recent CT lumbar spine 3/2 and xr hip 1/10. CT primarily showing degenerative changes. She states she is being followed by Dr. Wolfgang Phoenix for this, and has appointment at 1:30PM today, but he has only written her for extra strength tylenol.  Her neurological exam is normal today. No concerning features to suggest infectious process of the spine or hip and no spinal cord compression.   UA does show UTI, and sent for culture. Will treat with cipro given multiple PCN, cephalosporin allergies. Less likely pyelonephritis as no CVA tenderness, pain in lower back and buttock region. No systemic signs or symptoms of illness. Appropriate for outpatient management.  Has ambulated to bathroom w/o difficulty  The patient appears reasonably screened and/or stabilized for discharge and I doubt any other medical condition or other [facility] requiring further screening, evaluation, or treatment in the ED at this time prior to discharge.  Strict return and follow-up instructions reviewed. She expressed understanding of all discharge instructions and felt comfortable with the plan of care.     Final Clinical Impressions(s) / ED Diagnoses   Final diagnoses:  Left hip pain  Acute cystitis without hematuria    New Prescriptions New Prescriptions   CIPROFLOXACIN (CIPRO) 500 MG TABLET    Take 1 tablet (500 mg total) by mouth 2 (two) times daily.   TRAMADOL (ULTRAM) 50 MG TABLET    Take 1 tablet (50 mg total) by mouth every 6 (six) hours as needed for moderate pain or severe pain.   I personally performed the services described in this documentation, which was scribed in my presence. The recorded information has been reviewed and is accurate.    Forde Dandy, MD 08/02/16 [place] Kienna Moncada, MD 08/02/16 [phone]

## 2016-08-05 ENCOUNTER — Encounter: Payer: Self-pay | Admitting: Family Medicine

## 2016-08-05 ENCOUNTER — Ambulatory Visit (INDEPENDENT_AMBULATORY_CARE_PROVIDER_SITE_OTHER): Payer: Medicare Other | Admitting: Family Medicine

## 2016-08-05 VITALS — BP 144/80 | Temp 98.6°F | Ht 66.0 in

## 2016-08-05 DIAGNOSIS — M48062 Spinal stenosis, lumbar region with neurogenic claudication: Secondary | ICD-10-CM | POA: Diagnosis not present

## 2016-08-05 LAB — URINE CULTURE

## 2016-08-05 MED ORDER — HYDROCODONE-ACETAMINOPHEN 5-325 MG PO TABS: ORAL_TABLET | ORAL | 0 refills | Status: DC

## 2016-08-05 NOTE — Addendum Note (Signed)
Addended by: Dairl Ponder on: 08/05/2016 05:23 PM   Modules accepted: Orders

## 2016-08-05 NOTE — Progress Notes (Signed)
Referral ordered in EPIC. 

## 2016-08-05 NOTE — Progress Notes (Signed)
   Subjective:    Patient ID: [name], female    DOB: October 22, 1923, 83 y.o.   MRN: [id number]  Urinary Tract Infection   This is a new problem. The current episode started in the past 7 days. She has tried antibiotics for the symptoms.    Patient has concern of pain left side pain that radiates to lower back. Also has c/o nausea associated with the pain.  Patient having severe pain in the lower back radiates around. She is had a CAT scan of the abdomen shows is stable ovarian growth that had been evaluated in the past by gynecology and was not felt to be cancer  She is also had a CAT scan of the lumbar spine which shows severe spinal stenosis  Her pain is in the lower back radiates around the side into the groin region hurts with movement unable to do much walking Review of Systems Denies vomiting diarrhea fever chills dysuria.    Objective:   Physical Exam Lungs are clear hearts regular low back subjective tenderness extremities no edema skin warm dry       Assessment & Plan:  Spinal stenosis with arthritic changes progressive pain and discomfort I recommend for the patient to be referred for further evaluation and injections. If she does do injection she will need to come off of Xarelto for the prescribed length of time she may use pain medicine as needed caution drowsiness  UTI on antibiotics if further troubles follow-up  Follow-up in one month

## 2016-08-06 ENCOUNTER — Telehealth: Payer: Self-pay | Admitting: *Deleted

## 2016-08-06 NOTE — Telephone Encounter (Signed)
Post ED Visit - Positive Culture Follow-up  Culture report reviewed by antimicrobial stewardship pharmacist:  []  Elenor Quinones, Pharm.D. []  Heide Guile, Pharm.D., BCPS []  Parks Neptune, Pharm.D. []  Alycia Rossetti, Pharm.D., BCPS []  [place], Pharm.D., BCPS, AAHIVP []  Legrand Como, Pharm.D., BCPS, AAHIVP []  Milus Glazier, Pharm.D. []  Stephens November, Pharm.D. R, [place], PA-C  Positive urine culture Treated with ciprofloxacin, organism sensitive to the same and no further patient follow-up is required at this time.  Harlon Flor [facility] 08/06/2016, 2:21 PM

## 2016-08-08 ENCOUNTER — Encounter: Payer: Self-pay | Admitting: Family Medicine

## 2016-08-11 ENCOUNTER — Telehealth: Payer: Self-pay | Admitting: Family Medicine

## 2016-08-11 NOTE — Telephone Encounter (Signed)
Patient said that she is experiencing such bad side pain.  She has been to the ER a couple of times and seen Dr. Nicki Reaper for this.  She feels like she cannot get any relief and would like to know what Dr. Nicki Reaper recommends she do.  I offered her an appointment for today but she declined and would just like to speak with someone.

## 2016-08-11 NOTE — Telephone Encounter (Signed)
Discussed with pt. Pt verbalized understanding. Transferred to front to schedule ov with dr Nicki Reaper in 2 weeks

## 2016-08-11 NOTE — Telephone Encounter (Signed)
This patient has been seen multiple times. We have referred her to Dr. Carloyn Manner. Asked patient has she heard anything at this point regarding appointment from his office? Also is her pain still in the back inside or is it in the abdomen currently? We can prescribe a stronger dose of hydrocodone code own if she is interested

## 2016-08-11 NOTE — Telephone Encounter (Signed)
b[place]ale, please see message about referral.

## 2016-08-11 NOTE — Telephone Encounter (Signed)
Called pt, explained that referral was sent to Dr. Carloyn Manner on 08/08/16, he will review her records and have his staff contact her to schedule, also explained that I sent a letter to her explaining this along with his office phone number to call to check on the status of the referral  Pt verbalized understanding

## 2016-08-11 NOTE — Telephone Encounter (Signed)
Spoke with patient and patient stated that she has not heard from Dr.Roy's office or anything regarding a appointment. Patient states that pain is still in her side and goes around to her back. She states she is ok with increasing dose. She would like to know if your recommend taking 1 whole tablet instead of just 1/2 tablet. Please advise?

## 2016-08-11 NOTE — Telephone Encounter (Signed)
Pt states pain is about the same as when seen. She stopped taking the hydrocodone because it only took the pain away for about 10 - 15 mins. She was taking one half tablet.

## 2016-08-11 NOTE — Telephone Encounter (Signed)
#  1 I would recommend that she take a whole tablet at a time every 4 hours #2 please have [name] make sure that the referral did get it with Dr. Carloyn Manner and call this lady regarding what to expect in regards to this referral. #3 it would be a good idea for the patient to consider following up with me in a couple weeks' time for recheck

## 2016-08-12 ENCOUNTER — Encounter: Payer: Self-pay | Admitting: Family Medicine

## 2016-08-23 DIAGNOSIS — M545 Low back pain: Secondary | ICD-10-CM | POA: Diagnosis not present

## 2016-08-24 ENCOUNTER — Other Ambulatory Visit: Payer: Self-pay | Admitting: Family Medicine

## 2016-08-24 ENCOUNTER — Other Ambulatory Visit: Payer: Self-pay | Admitting: Cardiology

## 2016-08-26 ENCOUNTER — Telehealth: Payer: Self-pay | Admitting: Family Medicine

## 2016-08-26 NOTE — Telephone Encounter (Signed)
Pt states that she is comfortable with the specialist she is with now. She has decided to cancel her appointment with Dr. Nicki Reaper on the 10th and will keep her other appointment on the 23rd. She is scheduled to see her specialist soon after. She was advised to call back if she feels she is getting worse.

## 2016-08-26 NOTE — Telephone Encounter (Signed)
Patient seen Dr. Nicki Reaper on 08/05/16 for back pain.  She has an appointment on 08/30/16 to follow up, but she said she is not any better and is in such pain she doesn't know if she can even walk to come in the office.  She wants to know if Dr. Nicki Reaper thinks she should cancel this appointment.

## 2016-08-26 NOTE — Telephone Encounter (Signed)
It seems to me that the patient should do her best to keep the appointment and if necessary get a friend or family member to bring her here in after we see her there is a possibility we may be setting her up for further consultation with additional specialists

## 2016-08-30 ENCOUNTER — Ambulatory Visit: Payer: Medicare Other | Admitting: Family Medicine

## 2016-09-12 ENCOUNTER — Ambulatory Visit (INDEPENDENT_AMBULATORY_CARE_PROVIDER_SITE_OTHER): Payer: Medicare Other | Admitting: Family Medicine

## 2016-09-12 ENCOUNTER — Encounter: Payer: Self-pay | Admitting: Family Medicine

## 2016-09-12 VITALS — BP 130/72 | Ht 66.0 in | Wt 159.0 lb

## 2016-09-12 DIAGNOSIS — I482 Chronic atrial fibrillation, unspecified: Secondary | ICD-10-CM

## 2016-09-12 DIAGNOSIS — E039 Hypothyroidism, unspecified: Secondary | ICD-10-CM

## 2016-09-12 DIAGNOSIS — R5383 Other fatigue: Secondary | ICD-10-CM

## 2016-09-12 NOTE — Progress Notes (Signed)
   Subjective:    Patient ID: [name], female    DOB: [birth year], 83 y.o.   MRN: [id number]  HPI Patient is here for a follow up visit on spinal stenosis.  Patient states that pain has improved. Patient has no other concerns at this time.  Patient is still debilitated but she feels that she is trying to do about as good as she can tolerate. She states she is post to see a specialist to get further opinion from them. She thought she was having side effects from her blood thinner stating at times she felt fatigue so she stopped taking it-we discussed how that puts her at increased risk of stroke. Review of Systems Denies chest tightness pressure pain shortness of breath does relate fatigue    Objective:   Physical Exam  Lungs clear heart irregular pulse normal extremities no edema skin warm dry      Assessment & Plan:  Back pain gradually getting somewhat better. Patient very limited in her movements. I am hopeful that the specialist can try injections.  Patient recently stopped taking her blood thinner I told her that puts her at risk of stroke she stated she is willing to restart it again. She was concerned it was causing a rash  Lab work is ordered because of fatigue tiredness.

## 2016-09-13 LAB — BASIC METABOLIC PANEL
BUN/Creatinine Ratio: 25 (ref 12–28)
BUN: 18 mg/dL (ref 10–36)
CALCIUM: 9.3 mg/dL (ref 8.7–10.3)
CO2: 25 mmol/L (ref 18–29)
Chloride: 101 mmol/L (ref 96–106)
Creatinine, Ser: 0.73 mg/dL (ref 0.57–1.00)
GFR calc Af Amer: 84 mL/min/{1.73_m2} (ref >59)
GFR, EST NON AFRICAN AMERICAN: 73 mL/min/{1.73_m2} (ref >59)
Glucose: 91 mg/dL (ref 65–99)
Potassium: 4.3 mmol/L (ref 3.5–5.2)
Sodium: 143 mmol/L (ref 134–144)

## 2016-09-13 LAB — TSH: TSH: 16.41 u[IU]/mL — ABNORMAL HIGH (ref 0.450–4.500)

## 2016-09-13 LAB — T4, FREE: Free T4: 0.95 ng/dL (ref 0.82–1.77)

## 2016-09-14 ENCOUNTER — Other Ambulatory Visit: Payer: Self-pay | Admitting: *Deleted

## 2016-09-14 MED ORDER — LEVOTHYROXINE SODIUM 50 MCG PO TABS: 50.0000 ug | ORAL_TABLET | Freq: Every day | ORAL | 2 refills | Status: DC

## 2016-09-14 MED ORDER — LEVOTHYROXINE SODIUM 25 MCG PO TABS: ORAL_TABLET | ORAL | 0 refills | Status: DC

## 2016-09-19 DIAGNOSIS — M47817 Spondylosis without myelopathy or radiculopathy, lumbosacral region: Secondary | ICD-10-CM | POA: Diagnosis not present

## 2016-10-13 ENCOUNTER — Other Ambulatory Visit: Payer: Self-pay | Admitting: Cardiology

## 2016-10-20 ENCOUNTER — Ambulatory Visit (INDEPENDENT_AMBULATORY_CARE_PROVIDER_SITE_OTHER): Payer: Medicare Other | Admitting: Family Medicine

## 2016-10-20 ENCOUNTER — Encounter: Payer: Self-pay | Admitting: Family Medicine

## 2016-10-20 VITALS — BP 124/80 | Ht 66.0 in

## 2016-10-20 DIAGNOSIS — E039 Hypothyroidism, unspecified: Secondary | ICD-10-CM

## 2016-10-20 DIAGNOSIS — I482 Chronic atrial fibrillation, unspecified: Secondary | ICD-10-CM

## 2016-10-20 MED ORDER — DILTIAZEM HCL ER COATED BEADS 120 MG PO CP24: 120.0000 mg | ORAL_CAPSULE | Freq: Every day | ORAL | 5 refills | Status: DC

## 2016-10-20 NOTE — Progress Notes (Signed)
   Subjective:    Patient ID: [name], female    DOB: [birth year], 83 y.o.   MRN: [id number]  HPI  Patient in today for 6 week follow up for spinal stenosis and atrial fib.  Patient states she gets fatigued and tired when she stands up she feels somewhat dizzy she occasionally has fallen over but she denies any injury recently denies any head injury. Patient states that she has seen a specialist for spinal stenosis and states she also restarted on blood thinner.   States no other concerns this visit.   Review of Systems    currently right now denies any type of chest tightness pressure pain shortness breath she does relate a little bit of dizziness when she stands up Objective:   Physical Exam Blood pressure when she stands up test show a decrease in blood pressure Heart rate control Extremities some edema on the top of the feet but nothing severe        Assessment & Plan:  Atrial fib on anticoagulant continue this Spinal stenosis follow-up with specialists Mild orthostasis reduce diltiazem down to 120 mg. No need for any lab work currently follow-up 3 months

## 2016-10-31 ENCOUNTER — Ambulatory Visit (INDEPENDENT_AMBULATORY_CARE_PROVIDER_SITE_OTHER): Payer: Medicare Other | Admitting: Cardiology

## 2016-10-31 ENCOUNTER — Encounter: Payer: Self-pay | Admitting: Cardiology

## 2016-10-31 VITALS — BP 120/70 | HR 70 | Ht 64.0 in | Wt 158.0 lb

## 2016-10-31 DIAGNOSIS — I10 Essential (primary) hypertension: Secondary | ICD-10-CM | POA: Diagnosis not present

## 2016-10-31 DIAGNOSIS — R600 Localized edema: Secondary | ICD-10-CM | POA: Diagnosis not present

## 2016-10-31 DIAGNOSIS — I480 Paroxysmal atrial fibrillation: Secondary | ICD-10-CM

## 2016-10-31 NOTE — Progress Notes (Signed)
Clinical Summary Ms. Steidle is a 83 y.o.female  seen today for follow up of the following medical problems.   1. Afib/Aflutter - metoprolol makes her tired, higher dose made her more fatigued and did not improve her palpitatins.  -  changed to dilttiazem due to ongoing palpitations with increased beta blocker dose and fatigue.  - due to side effects and ineffectiveness of AV nodal agents started amiodarone.  - s/p DCCV 03/2016  - pcp recently lowered dilt from 180mg  to 120mg  due to orthostatic symptoms. Patient has mistakingly been taking both a 120mg  and a 180mg  table daily for a total of 300mg  daily. On this dose significant increase in LE edema - patient also with new diagnosis of hypothryoidism, potentially amio related. Started on synthroid by pcp - no recent palpitaitons. severe dizziness, difficulty walking at home.    2. LE edema - severe increase recently , mistakingly taking 300mg  daily  3. Hypothryoid - recent diagnosis, possibly related to amiodarone.     SH: her sister is also a patient of mine, Hydrographic surveyor    Past Medical History:  Diagnosis Date  . Abdominal pain, chronic, right lower quadrant    "ever since I had my appendix removed"  . Atrial fibrillation ([place])   . Cancer ([place])    left side  . Chronic back pain   . Edema   . GERD (gastroesophageal reflux disease)   . Hyperlipidemia   . Hypertension   . Osteopenia   . Osteoporosis   . Ovarian cyst 02/2015   left  . Recurrent abdominal pain    LLQ     Allergies  Allergen Reactions  . Amiodarone Itching  . Amoxicillin Hives  . Cephalosporins Itching  . Lipitor [Atorvastatin] Other (See Comments)    Unknown:patient is not aware of this allergy  . Lorazepam Other (See Comments)    Exceptional fatigue   . Lovenox [Enoxaparin Sodium] Nausea And Vomiting  . Morphine And Related Itching  . Penicillins Swelling    Has patient had a PCN reaction causing immediate rash,  facial/tongue/throat swelling, SOB or lightheadedness with hypotension: unknown Has patient had a PCN reaction causing severe rash involving mucus membranes or skin necrosis: unknown Has patient had a PCN reaction that required hospitalization No Has patient had a PCN reaction occurring within the last 10 years: no If all of the above answers are "NO", then may proceed with Cephalosporin use.   . Sulfa Antibiotics Swelling     Current Outpatient Prescriptions  Medication Sig Dispense Refill  . acetaminophen (TYLENOL) 500 MG tablet Take 500 mg by mouth every 6 (six) hours as needed for mild pain or moderate pain.    Marland Kitchen amiodarone (PACERONE) 200 MG tablet TAKE (1) TABLET BY MOUTH TWICE DAILY FOR 1 MONTH THEN CHANGE TO 1 DAILY THERAFTER. 30 tablet 11  . diltiazem (CARDIZEM CD) 120 MG 24 hr capsule Take 1 capsule (120 mg total) by mouth daily. 30 capsule 5  . DULoxetine (CYMBALTA) 20 MG capsule TAKE TWO CAPSULES BY MOUTH ONCE DAILY. 60 capsule 0  . furosemide (LASIX) 20 MG tablet Take 1 tablet (20 mg total) by mouth daily. 90 tablet 3  . HYDROcodone-acetaminophen (NORCO/VICODIN) 5-325 MG tablet 1/2 tablet every 6 hours as needed-caution drowsiness 40 tablet 0  . levothyroxine (SYNTHROID, LEVOTHROID) 50 MCG tablet Take 1 tablet (50 mcg total) by mouth daily. 30 tablet 2  . rivaroxaban (XARELTO) 20 MG TABS tablet Take 1 tablet (20 mg total) by  mouth daily with supper. 30 tablet 6   No current facility-administered medications for this visit.      Past Surgical History:  Procedure Laterality Date  . APPENDECTOMY    . APPENDECTOMY    . BREAST SURGERY    . CARDIOVERSION N/A 03/31/2016   Procedure: CARDIOVERSION;  Surgeon: Josue Hector, MD;  Location: [facility];  Service: Cardiovascular;  Laterality: N/A;  . COLON RESECTION    . COLON SURGERY    . HERNIA REPAIR    . HERNIA REPAIR       Allergies  Allergen Reactions  . Amiodarone Itching  . Amoxicillin Hives  . Cephalosporins Itching  .  Lipitor [Atorvastatin] Other (See Comments)    Unknown:patient is not aware of this allergy  . Lorazepam Other (See Comments)    Exceptional fatigue   . Lovenox [Enoxaparin Sodium] Nausea And Vomiting  . Morphine And Related Itching  . Penicillins Swelling    Has patient had a PCN reaction causing immediate rash, facial/tongue/throat swelling, SOB or lightheadedness with hypotension: unknown Has patient had a PCN reaction causing severe rash involving mucus membranes or skin necrosis: unknown Has patient had a PCN reaction that required hospitalization No Has patient had a PCN reaction occurring within the last 10 years: no If all of the above answers are "NO", then may proceed with Cephalosporin use.   . Sulfa Antibiotics Swelling      Family History  Problem Relation Age of Onset  . Diabetes Mother   . Heart disease Unknown   . Arthritis Unknown   . Cancer Unknown   . Hyperlipidemia Brother      Social History Ms. Dolson reports that she has never smoked. She has never used smokeless tobacco. Ms. Pfund reports that she does not drink alcohol.   Review of Systems CONSTITUTIONAL: No weight loss, fever, chills, weakness or fatigue.  HEENT: Eyes: No visual loss, blurred vision, double vision or yellow sclerae.No hearing loss, sneezing, congestion, runny nose or sore throat.  SKIN: No rash or itching.  CARDIOVASCULAR: per hpi RESPIRATORY: No shortness of breath, cough or sputum.  GASTROINTESTINAL: No anorexia, nausea, vomiting or diarrhea. No abdominal pain or blood.  GENITOURINARY: No burning on urination, no polyuria NEUROLOGICAL: per hpi MUSCULOSKELETAL: No muscle, back pain, joint pain or stiffness.  LYMPHATICS: No enlarged nodes. No history of splenectomy.  PSYCHIATRIC: No history of depression or anxiety.  ENDOCRINOLOGIC: No reports of sweating, cold or heat intolerance. No polyuria or polydipsia.  Marland Kitchen   Physical Examination Vitals:   10/31/16 1404  BP: 120/70   Pulse: 70   Vitals:   10/31/16 1404  Weight: 158 lb (71.7 kg)  Height: 5\' 4"  (1.626 m)    Gen: resting comfortably, no acute distress HEENT: no scleral icterus, pupils equal round and reactive, no palptable cervical adenopathy,  CV: irreg, no m/r/g, no jvd Resp: Clear to auscultation bilaterally GI: abdomen is soft, non-tender, non-distended, normal bowel sounds, no hepatosplenomegaly MSK: extremities are warm, 1+ bilateral LE edema Skin: warm, no rash Neuro:  no focal deficits Psych: appropriate affect   Diagnostic Studies     Assessment and Plan  1. Afib/Aflutter - side effects on higher dose AV nodal agents, did not resolve her symptoms of palpitatoins - started on amiodarone along with DCCV, did well for a period but now back in afib and also evidence of hypothyroidism possibly amio related - we will d/c amio. Continue dilt, her dose recently decreased due to orthsotatic symptoms. WE will  refer to EP to help with management.  - CHADS2Vasc score of 4, continue anticoag    2. HTN - at goal, she will continue current meds   3. LE edema - increased over the last few days, she has mistakingly been taking dilt 300mg  daily - dose of dilt corrected to 120mg  daily.   4. Hypothyroidism - possibly amio related, we have stopped amio - thyroid replacement per pcp   F/u 3 months. EP appt within 1-2 weeks.   Arnoldo Lenis, M.D.,

## 2016-10-31 NOTE — Patient Instructions (Addendum)
Medication Instructions:  STOP DILTIAZEM 180 MG DAILY  STOP AMIODARONE   Labwork: NONE  Testing/Procedures: NONE  Follow-Up: Your physician recommends that you schedule a follow-up appointment in: [place] physician recommends that you schedule a follow-up appointment in: DR. Lovena Le     Any Other Special Instructions Will Be Listed Below (If Applicable).  You have been referred to DR. Lovena Le    If you need a refill on your cardiac medications before your next appointment, please call your pharmacy.

## 2016-11-08 ENCOUNTER — Ambulatory Visit (INDEPENDENT_AMBULATORY_CARE_PROVIDER_SITE_OTHER): Payer: Medicare Other | Admitting: Internal Medicine

## 2016-11-08 ENCOUNTER — Encounter: Payer: Self-pay | Admitting: Internal Medicine

## 2016-11-08 VITALS — BP 142/70 | HR 73 | Ht 64.0 in | Wt 163.0 lb

## 2016-11-08 DIAGNOSIS — I482 Chronic atrial fibrillation, unspecified: Secondary | ICD-10-CM

## 2016-11-08 NOTE — Progress Notes (Signed)
HPI [name] is referred today by Dr. Harl Bowie for evaluation of atrial fibrillation and flutter. She is a pleasant elderly woman with Atrial fib dating back several months. She underwent DCCV in11/2017 and was then placed on amiodarone. She has developed dizziness and sob. She had to stop the amio due to these symptoms. She feels fairly well except her energy level is not as good and she has had dizziness which roughly correlates with her initiation of the amiodarone. She denies syncope but feels like her heart is beating too fast.  She notes peripheral edema. Allergies  Allergen Reactions  . Amiodarone Itching  . Amoxicillin Hives  . Cephalosporins Itching  . Lipitor [Atorvastatin] Other (See Comments)    Unknown:patient is not aware of this allergy  . Lorazepam Other (See Comments)    Exceptional fatigue   . Lovenox [Enoxaparin Sodium] Nausea And Vomiting  . Morphine And Related Itching  . Penicillins Swelling    Has patient had a PCN reaction causing immediate rash, facial/tongue/throat swelling, SOB or lightheadedness with hypotension: unknown Has patient had a PCN reaction causing severe rash involving mucus membranes or skin necrosis: unknown Has patient had a PCN reaction that required hospitalization No Has patient had a PCN reaction occurring within the last 10 years: no If all of the above answers are "NO", then may proceed with Cephalosporin use.   . Sulfa Antibiotics Swelling     Current Outpatient Prescriptions  Medication Sig Dispense Refill  . acetaminophen (TYLENOL) 500 MG tablet Take 500 mg by mouth every 6 (six) hours as needed for mild pain or moderate pain.    Marland Kitchen diltiazem (CARDIZEM CD) 120 MG 24 hr capsule Take 1 capsule (120 mg total) by mouth daily. 30 capsule 5  . DULoxetine (CYMBALTA) 20 MG capsule TAKE TWO CAPSULES BY MOUTH ONCE DAILY. 60 capsule 0  . furosemide (LASIX) 20 MG tablet Take 1 tablet (20 mg total) by mouth daily. 90 tablet 3  .  HYDROcodone-acetaminophen (NORCO/VICODIN) 5-325 MG tablet 1/2 tablet every 6 hours as needed-caution drowsiness 40 tablet 0  . levothyroxine (SYNTHROID, LEVOTHROID) 50 MCG tablet Take 1 tablet (50 mcg total) by mouth daily. 30 tablet 2  . rivaroxaban (XARELTO) 20 MG TABS tablet Take 1 tablet (20 mg total) by mouth daily with supper. 30 tablet 6   No current facility-administered medications for this visit.      Past Medical History:  Diagnosis Date  . Abdominal pain, chronic, right lower quadrant    "ever since I had my appendix removed"  . Atrial fibrillation ([place])   . Cancer ([place])    left side  . Chronic back pain   . Edema   . GERD (gastroesophageal reflux disease)   . Hyperlipidemia   . Hypertension   . Osteopenia   . Osteoporosis   . Ovarian cyst 02/2015   left  . Recurrent abdominal pain    LLQ    ROS:   All systems reviewed and negative except as noted in the HPI.   Past Surgical History:  Procedure Laterality Date  . APPENDECTOMY    . APPENDECTOMY    . BREAST SURGERY    . CARDIOVERSION N/A 03/31/2016   Procedure: CARDIOVERSION;  Surgeon: Josue Hector, MD;  Location: [facility];  Service: Cardiovascular;  Laterality: N/A;  . COLON RESECTION    . COLON SURGERY    . HERNIA REPAIR    . HERNIA REPAIR       Family  History  Problem Relation Age of Onset  . Diabetes Mother   . Heart disease Unknown   . Arthritis Unknown   . Cancer Unknown   . Hyperlipidemia Brother      Social History   Social History  . Marital status: Widowed    Spouse name: N/A  . Number of children: 1  . Years of education: N/A   Occupational History  .  Retired   Social History Main Topics  . Smoking status: Never Smoker  . Smokeless tobacco: Never Used  . Alcohol use No  . Drug use: No  . Sexual activity: No   Other Topics Concern  . Not on file   Social History Narrative  . No narrative on file     BP (!) 142/70   Pulse 73   Ht 5\' 4"  (1.626 m)   Wt 163 lb (73.9  kg)   SpO2 96%   BMI 27.98 kg/m   Physical Exam:  Well appearing elderly woman, NAD HEENT: Unremarkable Neck:  6 cm JVD, no thyromegally Lymphatics:  No adenopathy Back:  No CVA tenderness Lungs:  Clear with no wheezes HEART:  IRegular rate rhythm, no murmurs, no rubs, no clicks Abd:  soft, positive bowel sounds, no organomegally, no rebound, no guarding Ext:  2 plus pulses, 1+ peripheral edema, no cyanosis, no clubbing Skin:  No rashes no nodules Neuro:  CN II through XII intact, motor grossly intact  EKG - reviewed  Assess/Plan: 1. Persistent atrial fib - I have discussed the treatment options. Her advanced age and otther comorbidities make her a poor candidate for rhythm control. I am not convinced her rate is controlled and I would like her to wear a cardiac monitor. If she is having rapid atrial fib then insertion of a PPM and AV node ablation would be reasonable.  2. Acute on chronic diastolic heart failure - her symptoms are class 2. She is encouraged to reduce her salt intake. 3. HTN - her blood pressure is only a little elevated. I have asked her to reduce her salt intake.  Mikle Bosworth.D.

## 2016-11-08 NOTE — Patient Instructions (Signed)
Your physician recommends that you schedule a follow-up appointment in: 6 week Dr Lovena Le    Your physician has recommended that you wear a holter monitor. Holter monitors are medical devices that record the heart's electrical activity. Doctors most often use these monitors to diagnose arrhythmias. Arrhythmias are problems with the speed or rhythm of the heartbeat. The monitor is a small, portable device. You can wear one while you do your normal daily activities. This is usually used to diagnose what is causing palpitations/syncope (passing out).      Your physician recommends that you continue on your current medications as directed. Please refer to the Current Medication list given to you today.     No lab work today     Thank you for [place] !

## 2016-11-09 ENCOUNTER — Ambulatory Visit (HOSPITAL_COMMUNITY)
Admission: RE | Admit: 2016-11-09 | Discharge: 2016-11-09 | Disposition: A | Discharge status: Home / Self Care | Payer: Medicare Other | Source: Ambulatory Visit | Attending: Internal Medicine | Admitting: Internal Medicine

## 2016-11-09 DIAGNOSIS — I482 Chronic atrial fibrillation, unspecified: Secondary | ICD-10-CM

## 2016-11-11 DIAGNOSIS — E039 Hypothyroidism, unspecified: Secondary | ICD-10-CM | POA: Diagnosis not present

## 2016-11-12 ENCOUNTER — Other Ambulatory Visit: Payer: Self-pay | Admitting: Family Medicine

## 2016-11-12 LAB — TSH: TSH: 18.55 u[IU]/mL — ABNORMAL HIGH (ref 0.450–4.500)

## 2016-11-14 MED ORDER — LEVOTHYROXINE SODIUM 75 MCG PO TABS: 75.0000 ug | ORAL_TABLET | Freq: Every day | ORAL | 5 refills | Status: DC

## 2016-11-14 NOTE — Addendum Note (Signed)
Addended by: Launa Grill on: 11/14/2016 08:47 AM   Modules accepted: Orders

## 2016-11-16 ENCOUNTER — Ambulatory Visit (INDEPENDENT_AMBULATORY_CARE_PROVIDER_SITE_OTHER): Payer: Medicare Other | Admitting: Family Medicine

## 2016-11-16 ENCOUNTER — Telehealth: Payer: Self-pay | Admitting: Family Medicine

## 2016-11-16 ENCOUNTER — Encounter: Payer: Self-pay | Admitting: Family Medicine

## 2016-11-16 VITALS — Ht 64.0 in | Wt 162.0 lb

## 2016-11-16 DIAGNOSIS — I482 Chronic atrial fibrillation, unspecified: Secondary | ICD-10-CM

## 2016-11-16 DIAGNOSIS — R600 Localized edema: Secondary | ICD-10-CM | POA: Diagnosis not present

## 2016-11-16 DIAGNOSIS — E039 Hypothyroidism, unspecified: Secondary | ICD-10-CM | POA: Diagnosis not present

## 2016-11-16 MED ORDER — FUROSEMIDE 20 MG PO TABS: ORAL_TABLET | ORAL | 0 refills | Status: DC

## 2016-11-16 NOTE — Telephone Encounter (Signed)
Pt called stating that her feet have been swelling for the past couple weeks and is having a hard time trying to go to sleep. Pt states that she would also like to get a refill on xanax because that helped to relax her to go to sleep. Please advise.   [place].

## 2016-11-16 NOTE — Telephone Encounter (Signed)
Pt states she is having swelling in both feet and it does not go down. Pain in both ankles. And left leg swelling and pain from knee down to feet. Pt advised she needs an appt. Today. Transferred to front to scheduled office visit.

## 2016-11-16 NOTE — Progress Notes (Signed)
   Subjective:    Patient ID: [name], female    DOB: [birth year], 83 y.o.   MRN: [id number]  HPI Patient arrives with c/o swelling in both feet and ankles since starting levothyroxine.  Followed by the cardiologist for chronic atrial fibrillation. Cardizem has been adjusted. Still persistent swollen ankles and feet.  No shortness of breath no orthopnea out of ordinary next  No chest pain  Claims compliance with diuretics   Review of Systems No headache, no major weight loss or weight gain, no chest pain no back pain abdominal pain no change in bowel habits complete ROS otherwise negative     Objective:   Physical Exam  Alert and oriented, vitals reviewed and stable, NAD ENT-TM's and ext canals WNL bilat via otoscopic exam Soft palate, tonsils and post pharynx WNL via oropharyngeal exam Neck-symmetric, no masses; thyroid nonpalpable and nontender Pulmonary-no tachypnea or accessory muscle use; Clear without wheezes via auscultation Card--no abnrml murmurs, rhythm reg and rate WNL Carotid pulses symmetric, without bruits Ankles 1+ edema bilateral pulses good sensation intact      Assessment & Plan:  Impression peripheral edema likely secondary to venous stasis plus calcium channel blocker side effect. Discussed also potential thyroid component, but not the supplementation, discuss. Will increase diuretic to 2 tablets every other day one tablet every OD  .25 Greater than 50% of this 25 minute face to face visit was spent in counseling and discussion and coordination of care regarding the above diagnosis/diagnosies

## 2016-11-18 ENCOUNTER — Other Ambulatory Visit: Payer: Self-pay | Admitting: Family Medicine

## 2016-11-18 ENCOUNTER — Telehealth: Payer: Self-pay | Admitting: Family Medicine

## 2016-11-18 MED ORDER — RIVAROXABAN 20 MG PO TABS: 20.0000 mg | ORAL_TABLET | Freq: Every day | ORAL | 1 refills | Status: DC

## 2016-11-18 MED ORDER — TRAZODONE HCL 50 MG PO TABS: ORAL_TABLET | ORAL | 3 refills | Status: DC

## 2016-11-18 NOTE — Telephone Encounter (Signed)
Patient is needing Rx for Xanax and Xarelto.  Larene Pickett

## 2016-11-18 NOTE — Telephone Encounter (Signed)
Try 1/2 trazadone prn for sleep instead of xanax if it causes too much a.m. Drowsiness then stop, xarelto times one done

## 2016-11-18 NOTE — Telephone Encounter (Signed)
Patient advised Try 1/2 trazadone prn for sleep instead of xanax if it causes too much a.m. Drowsiness then stop, xarelto times one. Prescription sent electronically to pharmacy. Patient verbalized understanding.

## 2016-11-18 NOTE — Telephone Encounter (Signed)
Xanax not on med list- med had been stopped due to fall risk/hydrocodone but patient wants to restart med Xarelto is being prescribed and managed by cardiology not our office

## 2016-11-20 NOTE — Telephone Encounter (Signed)
#  1 please have the pharmacy take this off her med list. Please have pharmacy-[facility]-notify Dr. branch regarding any further refills on Xarelto

## 2016-11-24 ENCOUNTER — Encounter: Payer: Self-pay | Admitting: Family Medicine

## 2016-11-24 ENCOUNTER — Ambulatory Visit (INDEPENDENT_AMBULATORY_CARE_PROVIDER_SITE_OTHER): Payer: Medicare Other | Admitting: Family Medicine

## 2016-11-24 VITALS — BP 134/80 | Ht 66.0 in

## 2016-11-24 DIAGNOSIS — R42 Dizziness and giddiness: Secondary | ICD-10-CM

## 2016-11-24 DIAGNOSIS — I482 Chronic atrial fibrillation, unspecified: Secondary | ICD-10-CM

## 2016-11-24 DIAGNOSIS — I10 Essential (primary) hypertension: Secondary | ICD-10-CM

## 2016-11-24 NOTE — Progress Notes (Signed)
   Subjective:    Patient ID: [name], female    DOB: [birth year], 83 y.o.   MRN: [id number]  Hypertension  This is a new problem. The current episode started today. Associated symptoms include headaches. (Nausea, Dizziness )   Patient states no other concerns this visit.  Earlier today had headache dizziness and blood pressure was elevated she had not been taking her diltiazem she went ahead and took one her blood pressure seems to be doing better now denies chest pressure tightness pain denies unilateral numbness weakness no wheezing or difficulty breathing  Review of Systems  Neurological: Positive for headaches.  Please see above     Objective:   Physical Exam  Hearts irregular rate is controlled blood pressure is good 134/74 extremities no edema skin warm dry no orthostasis      Assessment & Plan:  Patient was encouraged take her blood pressure medicine every single day I do not feel it is in her best interest to make any major changes in her medicine I like to see her back in a few weeks time to see how she is doing she was instructed that should things get worse before then to notify us we will be happy to see her

## 2016-12-07 ENCOUNTER — Other Ambulatory Visit: Payer: Self-pay

## 2016-12-07 ENCOUNTER — Emergency Department (HOSPITAL_COMMUNITY): Payer: Medicare Other

## 2016-12-07 ENCOUNTER — Encounter (HOSPITAL_COMMUNITY): Payer: Self-pay | Admitting: Cardiology

## 2016-12-07 ENCOUNTER — Emergency Department (HOSPITAL_COMMUNITY)
Admission: EM | Admit: 2016-12-07 | Discharge: 2016-12-07 | Disposition: A | Discharge status: Home / Self Care | Payer: Medicare Other | Attending: Emergency Medicine | Admitting: Emergency Medicine

## 2016-12-07 DIAGNOSIS — I10 Essential (primary) hypertension: Secondary | ICD-10-CM | POA: Diagnosis not present

## 2016-12-07 DIAGNOSIS — Z79899 Other long term (current) drug therapy: Secondary | ICD-10-CM | POA: Diagnosis not present

## 2016-12-07 DIAGNOSIS — Z859 Personal history of malignant neoplasm, unspecified: Secondary | ICD-10-CM | POA: Insufficient documentation

## 2016-12-07 DIAGNOSIS — K432 Incisional hernia without obstruction or gangrene: Secondary | ICD-10-CM | POA: Diagnosis not present

## 2016-12-07 DIAGNOSIS — R0789 Other chest pain: Secondary | ICD-10-CM | POA: Insufficient documentation

## 2016-12-07 DIAGNOSIS — R079 Chest pain, unspecified: Secondary | ICD-10-CM | POA: Diagnosis not present

## 2016-12-07 LAB — CBC
HEMATOCRIT: 41.4 % (ref 36.0–46.0)
Hemoglobin: 13.1 g/dL (ref 12.0–15.0)
MCH: 27.9 pg (ref 26.0–34.0)
MCHC: 31.6 g/dL (ref 30.0–36.0)
MCV: 88.1 fL (ref 78.0–100.0)
PLATELETS: 304 10*3/uL (ref 150–400)
RBC: 4.7 MIL/uL (ref 3.87–5.11)
RDW: 13.7 % (ref 11.5–15.5)
WBC: 9.8 10*3/uL (ref 4.0–10.5)

## 2016-12-07 LAB — BASIC METABOLIC PANEL
ANION GAP: 10 (ref 5–15)
BUN: 16 mg/dL (ref 6–20)
CHLORIDE: 104 mmol/L (ref 101–111)
CO2: 26 mmol/L (ref 22–32)
CREATININE: 0.85 mg/dL (ref 0.44–1.00)
Calcium: 9.3 mg/dL (ref 8.9–10.3)
GFR calc non Af Amer: 58 mL/min — ABNORMAL LOW (ref >60)
Glucose, Bld: 117 mg/dL — ABNORMAL HIGH (ref 65–99)
POTASSIUM: 3.8 mmol/L (ref 3.5–5.1)
SODIUM: 140 mmol/L (ref 135–145)

## 2016-12-07 LAB — HEPATIC FUNCTION PANEL
ALBUMIN: 4 g/dL (ref 3.5–5.0)
ALT: 19 U/L (ref 14–54)
AST: 21 U/L (ref 15–41)
Alkaline Phosphatase: 89 U/L (ref 38–126)
BILIRUBIN INDIRECT: 0.7 mg/dL (ref 0.3–0.9)
Bilirubin, Direct: 0.1 mg/dL (ref 0.1–0.5)
TOTAL PROTEIN: 7.1 g/dL (ref 6.5–8.1)
Total Bilirubin: 0.8 mg/dL (ref 0.3–1.2)

## 2016-12-07 LAB — LIPASE, BLOOD: LIPASE: 20 U/L (ref 11–51)

## 2016-12-07 LAB — CG4 I-STAT (LACTIC ACID): Lactic Acid, Venous: 1 mmol/L (ref 0.5–1.9)

## 2016-12-07 LAB — TROPONIN I: Troponin I: <0.03 ng/mL (ref <0.03)

## 2016-12-07 MED ORDER — METHOCARBAMOL 500 MG PO TABS: 500.0000 mg | ORAL_TABLET | Freq: Two times a day (BID) | ORAL | 0 refills | Status: DC | PRN

## 2016-12-07 MED ORDER — FENTANYL CITRATE (PF) 100 MCG/2ML IJ SOLN
25.0000 ug | Freq: Once | INTRAMUSCULAR | Status: AC
  Administered 2016-12-07: 25 ug via INTRAVENOUS
  Filled 2016-12-07: qty 2

## 2016-12-07 MED ORDER — METHOCARBAMOL 500 MG PO TABS
500.0000 mg | ORAL_TABLET | Freq: Once | ORAL | Status: AC
  Administered 2016-12-07: 500 mg via ORAL
  Filled 2016-12-07: qty 1

## 2016-12-07 MED ORDER — IOPAMIDOL (ISOVUE-370) INJECTION 76%
100.0000 mL | Freq: Once | INTRAVENOUS | Status: AC | PRN
  Administered 2016-12-07: 100 mL via INTRAVENOUS

## 2016-12-07 MED ORDER — ONDANSETRON HCL 4 MG/2ML IJ SOLN
4.0000 mg | Freq: Once | INTRAMUSCULAR | Status: AC
  Administered 2016-12-07: 4 mg via INTRAVENOUS
  Filled 2016-12-07: qty 2

## 2016-12-07 NOTE — ED Notes (Signed)
Assisted to the bathroom again.

## 2016-12-07 NOTE — ED Triage Notes (Signed)
Chest pain since yesterday.  And c/o mid back pain.  Pt states she fell Monday morning.

## 2016-12-07 NOTE — ED Provider Notes (Signed)
[facility] DEPT Provider Note   CSN: [id number] Arrival date & time: 12/07/16  1602     History   Chief Complaint Chief Complaint  Patient presents with  . Chest Pain    HPI [name] is a 83 y.o. female.  HPI Patient states that she was pulling weeds on Sunday. She twisted and fell from a crouched position. No head or neck trauma. She developed chest pain on Tuesday. This radiated to her back. This is been constant since Tuesday. The pain is worse with movement. She now is having upper abdominal pain since being in the emergency department. No focal weakness or numbness. No shortness of breath, cough or lower extremity swelling. Past Medical History:  Diagnosis Date  . Abdominal pain, chronic, right lower quadrant    "ever since I had my appendix removed"  . Atrial fibrillation ([place])   . Cancer ([place])    left side  . Chronic back pain   . Edema   . GERD (gastroesophageal reflux disease)   . Hyperlipidemia   . Hypertension   . Osteopenia   . Osteoporosis   . Ovarian cyst 02/2015   left  . Recurrent abdominal pain    LLQ    Patient Active Problem List   Diagnosis Date Noted  . Major depression in partial remission ([place]) 03/17/2016  . Atrial fibrillation ([place]) 01/12/2016  . Osteoarthritis of right knee 12/15/2015  . Major depression 09/22/2015  . Ventral hernia 05/21/2015  . Ovarian tumor 04/13/2015  . Squamous cell skin cancer 08/13/2014  . Impaired fasting glucose 08/05/2013  . Neuropathy 01/11/2013  . Long term (current) use of anticoagulants 07/27/2012  . Atrial fibrillation with RVR ([place]) 01/29/2012  . Anxiety 01/29/2012  . GERD (gastroesophageal reflux disease) 01/29/2012  . Chronic anticoagulation 01/29/2012  . Sciatica of right side 05/10/2011    Past Surgical History:  Procedure Laterality Date  . APPENDECTOMY    . APPENDECTOMY    . BREAST SURGERY    . CARDIOVERSION N/A 03/31/2016   Procedure: CARDIOVERSION;  Surgeon: Josue Hector, MD;   Location: [facility];  Service: Cardiovascular;  Laterality: N/A;  . COLON RESECTION    . COLON SURGERY    . HERNIA REPAIR    . HERNIA REPAIR      OB History    Gravida Para Term Preterm AB Living   2 1     1 1    SAB TAB Ectopic Multiple Live Births   1               Home Medications    Prior to Admission medications   Medication Sig Start Date End Date Taking? Authorizing Provider  diltiazem (CARDIZEM CD) 120 MG 24 hr capsule Take 1 capsule (120 mg total) by mouth daily. 10/20/16  Yes Luking, Elayne Snare, MD  DULoxetine (CYMBALTA) 20 MG capsule TAKE TWO CAPSULES BY MOUTH ONCE DAILY. Patient taking differently: TAKE ONE CAPSULE BY MOUTH TWICE DAILY. 08/24/16  Yes [name], MD  furosemide (LASIX) 20 MG tablet One tablet every other day and then 2 tablets every other day Patient taking differently: Take 40 mg by mouth every other day.  11/16/16  Yes Mikey Kirschner, MD  HYDROcodone-acetaminophen (NORCO/VICODIN) 5-325 MG tablet 1/2 tablet every 6 hours as needed-caution drowsiness Patient taking differently: Take 0.5 tablets by mouth every 6 (six) hours as needed for moderate pain or severe pain. 1/2 tablet every 6 hours as needed-caution drowsiness 08/05/16  Yes Luking, Scott A,  MD  levothyroxine (SYNTHROID, LEVOTHROID) 75 MCG tablet Take 1 tablet (75 mcg total) by mouth daily. 11/14/16  Yes [name], MD  rivaroxaban (XARELTO) 20 MG TABS tablet Take 1 tablet (20 mg total) by mouth daily with supper. 11/18/16  Yes Luking, Elayne Snare, MD  methocarbamol (ROBAXIN) 500 MG tablet Take 1 tablet (500 mg total) by mouth 2 (two) times daily as needed for muscle spasms. 12/07/16   Julianne Rice, MD  traZODone (DESYREL) 50 MG tablet 1/2 qhs prn sleep Patient not taking: Reported on 12/07/2016 11/18/16   [name], MD    Family History Family History  Problem Relation Age of Onset  . Diabetes Mother   . Heart disease Unknown   . Arthritis Unknown   . Cancer Unknown   . Hyperlipidemia  Brother     Social History Social History  Substance Use Topics  . Smoking status: Never Smoker  . Smokeless tobacco: Never Used  . Alcohol use No     Allergies   Amiodarone; Amoxicillin; Cephalosporins; Lipitor [atorvastatin]; Lorazepam; Lovenox [enoxaparin sodium]; Morphine and related; Penicillins; and Sulfa antibiotics   Review of Systems Review of Systems  Constitutional: Negative for chills and fever.  HENT: Negative for facial swelling.   Eyes: Negative for visual disturbance.  Respiratory: Negative for cough, chest tightness, shortness of breath and wheezing.   Cardiovascular: Positive for chest pain. Negative for palpitations and leg swelling.  Gastrointestinal: Positive for abdominal pain. Negative for diarrhea, nausea and vomiting.  Genitourinary: Negative for difficulty urinating, dysuria, flank pain and frequency.  Musculoskeletal: Positive for back pain and myalgias. Negative for arthralgias, neck pain and neck stiffness.  Skin: Negative for rash and wound.  Neurological: Negative for dizziness, weakness, light-headedness, numbness and headaches.  All other systems reviewed and are negative.    Physical Exam Updated Vital Signs BP (!) 153/69   Pulse 82   Temp 98.7 F (37.1 C) (Oral)   Resp 16   Ht 5' 4.5" (1.638 m)   Wt 70.3 kg (155 lb)   SpO2 95%   BMI 26.19 kg/m   Physical Exam  Constitutional: She is oriented to person, place, and time. She appears well-developed and well-nourished. No distress.  HENT:  Head: Normocephalic and atraumatic.  Mouth/Throat: Oropharynx is clear and moist. No oropharyngeal exudate.  Eyes: Pupils are equal, round, and reactive to light. EOM are normal.  Neck: Normal range of motion. Neck supple.  No posterior midline cervical tenderness to palpation.  Cardiovascular: Normal rate.  Exam reveals no friction rub.   No murmur heard. Irregularly irregular  Pulmonary/Chest: Effort normal and breath sounds normal. No  respiratory distress. She has no wheezes. She has no rales. She exhibits tenderness (patient has mild anterior sternal and right parasternal border tenderness to palpation. There is no crepitance or deformity.).  Abdominal: Soft. Bowel sounds are normal. There is tenderness. There is no rebound and no guarding.  Patient does have some mild epigastric tenderness to palpation. No palpable masses.  Musculoskeletal: Normal range of motion. She exhibits no edema or tenderness.  No midline thoracic or lumbar tenderness. Patient does have some right-sided thoracic tenderness along the inferior medial border of the scapula. No lower extremity swelling or asymmetry. 2+ distal pulses in all extremities.  Neurological: She is alert and oriented to person, place, and time.  Moving all extremities without focal weakness. Sensation is grossly intact.  Skin: Skin is warm and dry. No rash noted. She is not diaphoretic. No erythema.  Psychiatric: She has a normal mood and affect. Her behavior is normal.  Nursing note and vitals reviewed.    ED Treatments / Results  Labs (all labs ordered are listed, but only abnormal results are displayed) Labs Reviewed  BASIC METABOLIC PANEL - Abnormal; Notable for the following:       Result Value   Glucose, Bld 117 (*)    GFR calc non Af Amer 58 (*)    All other components within normal limits  CBC  TROPONIN I  HEPATIC FUNCTION PANEL  LIPASE, BLOOD  TROPONIN I  I-STAT CG4 LACTIC ACID, ED  CG4 I-STAT (LACTIC ACID)    EKG  EKG Interpretation  Date/Time:  Wednesday December 07 2016 16:06:59 EDT Ventricular Rate:  84 PR Interval:    QRS Duration: 72 QT Interval:  384 QTC Calculation: 453 R Axis:   71 Text Interpretation:  Atrial fibrillation with a competing junctional pacemaker Low voltage QRS Nonspecific ST abnormality Abnormal ECG Confirmed by Lita Mains  MD, Joaquina Nissen ([id number]) on 12/07/2016 8:17:48 PM       Radiology Dg Chest 2 View  Result Date:  12/07/2016 CLINICAL DATA:  Central chest pain since yesterday, mid back pain below shoulder blades since falling out of bed on Monday EXAM: CHEST  2 VIEW COMPARISON:  12/12/2015 FINDINGS: Enlargement of cardiac silhouette. Mediastinal contours and pulmonary vascularity normal. Atherosclerotic calcification aorta. Bibasilar atelectasis. Lungs otherwise clear. No pulmonary infiltrate, pleural effusion or pneumothorax. Bones demineralized. IMPRESSION: Bibasilar atelectasis. Enlargement of cardiac silhouette. Aortic Atherosclerosis (ICD10-I70.0). Electronically Signed   By: Lavonia Dana M.D.   On: 12/07/2016 16:54   Ct Angio Chest/abd/pel For Dissection W And/or Wo Contrast  Result Date: 12/07/2016 CLINICAL DATA:  83 year old female with chest pain radiating to the back. EXAM: CT ANGIOGRAPHY CHEST, ABDOMEN AND PELVIS TECHNIQUE: Multidetector CT imaging through the chest, abdomen and pelvis was performed using the standard protocol during bolus administration of intravenous contrast. Multiplanar reconstructed images and MIPs were obtained and reviewed to evaluate the vascular anatomy. CONTRAST:  100 cc Isovue 370 COMPARISON:  Chest radiograph dated 12/07/2016 FINDINGS: CTA CHEST FINDINGS Cardiovascular: There is mild cardiomegaly. No pericardial effusion. There is moderate atherosclerotic calcification of the thoracic aorta. There is no aneurysmal dilatation or evidence of dissection. The origins of the great vessels of the aortic arch appear patent. There is no CT evidence of pulmonary artery embolism. Mediastinum/Nodes: No hilar or mediastinal adenopathy. Esophagus and the thyroid gland are grossly unremarkable. Lungs/Pleura: There are bibasilar subsegmental atelectasis/ scarring. The lungs are clear. There is no pleural effusion or pneumothorax. The central airways are patent. Musculoskeletal: There is no axillary adenopathy. Small calcific densities or biopsy clips noted in the left breast. No fluid collection. No  acute osseous pathology. Review of the MIP images confirms the above findings. CTA ABDOMEN AND PELVIS FINDINGS VASCULAR Aorta: There is advanced atherosclerotic calcification of the aorta. There is no aneurysmal dilatation or evidence of dissection. Celiac: There is atherosclerotic calcification of the origin of the celiac axis with mild luminal narrowing. The celiac axis is patent. SMA: Atherosclerotic calcification of the origin of the SMA in with mild narrowing. The SMA is patent. No aneurysm or dissection. Renals: There are atherosclerotic calcification of the renal ostia. These vessels remain patent. IMA: Patent without evidence of aneurysm, dissection, vasculitis or significant stenosis. Inflow: There is moderate atherosclerotic calcification of the iliac vessels. No aneurysmal dilatation, dissection or inflammation. The iliac vessels are patent. Veins: No obvious venous abnormality within the limitations of this  arterial phase study. Review of the MIP images confirms the above findings. NON-VASCULAR There is no intra-abdominal free air or free fluid. Hepatobiliary: No focal liver abnormality is seen. No gallstones, gallbladder wall thickening, or biliary dilatation. Pancreas: Unremarkable. No pancreatic ductal dilatation or surrounding inflammatory changes. Spleen: Normal in size without focal abnormality. Adrenals/Urinary Tract: Adrenal glands are unremarkable. Kidneys are normal, without renal calculi, focal lesion, or hydronephrosis. Bladder is unremarkable. Stomach/Bowel: There is a small hiatal hernia. There are multiple duodenal diverticulum measuring up to 4.5 cm at the junction of the second and third portion of the duodenum. There is adhesions and herniation of a short segment of distal small bowel in the midline anterior pelvic wall along the incisional scar. There is no evidence of obstruction of this bowel loop. There is however mild inflammatory changes of this loop of bowel and adjacent segments  of small bowel which may represent enteritis or be related to a degree of strangulation. There is no pneumatosis or free air. There is moderate stool throughout the colon. There is sigmoid diverticulosis without active inflammatory changes. There postsurgical changes of the sigmoid colon. Appendectomy. Lymphatic: No adenopathy. Reproductive: The uterus and ovaries are grossly unremarkable. No pelvic mass. Other: Midline vertical anterior pelvic wall incisional scar. No fluid collection. Musculoskeletal: Osteopenia with degenerative changes of the spine. No acute fracture. Multilevel disc desiccation with vacuum phenomena. Review of the MIP images confirms the above findings. IMPRESSION: 1. No aortic aneurysm or dissection. No CT evidence of pulmonary artery embolus. 2. Adhesion and focal herniation of a short segment of distal small bowel in the midline anterior pelvic wall along the incisional scar with inflammatory changes of this loop of small bowel. Findings may represent enteritis or be related to a degree of strangulation caused by adhesion/herniation. There is no pneumatosis. No portal venous gas or other evidence of bowel ischemia. Clinical correlation is recommended. 3. Colonic diverticulosis without active inflammatory changes. No bowel obstruction. 4.  Aortic Atherosclerosis (ICD10-I70.0). Electronically Signed   By: Anner Crete M.D.   On: 12/07/2016 21:56    Procedures Procedures (including critical care time)  Medications Ordered in ED Medications  methocarbamol (ROBAXIN) tablet 500 mg (not administered)  ondansetron (ZOFRAN) injection 4 mg (4 mg Intravenous Given 12/07/16 2056)  fentaNYL (SUBLIMAZE) injection 25 mcg (25 mcg Intravenous Given 12/07/16 2058)  iopamidol (ISOVUE-370) 76 % injection 100 mL (100 mLs Intravenous Contrast Given 12/07/16 2122)     Initial Impression / Assessment and Plan / ED Course  I have reviewed the triage vital signs and the nursing notes.  Pertinent  labs & imaging results that were available during my care of the patient were reviewed by me and considered in my medical decision making (see chart for details).     Patient is having no tenderness to palpation over the pelvic incisional hernia. Normal lactic acid. Low suspicion for ischemia. Possible enterocolitis. EKG with atrial fibrillation. Troponin 2 is normal. CT angio of chest and abdomen without evidence of dissection or PE. Patient likely is having muscle skeletal pain from her fall. Will treat symptomatically. Patient can follow-up with her primary physician regarding her hernia. She's been given return precautions and is voiced understanding.  Final Clinical Impressions(s) / ED Diagnoses   Final diagnoses:  Chest wall pain  Incisional hernia, without obstruction or gangrene    New Prescriptions New Prescriptions   METHOCARBAMOL (ROBAXIN) 500 MG TABLET    Take 1 tablet (500 mg total) by mouth 2 (two) times daily  as needed for muscle spasms.     Julianne Rice, MD 12/07/16 2245

## 2016-12-07 NOTE — ED Notes (Signed)
ED Provider at bedside. 

## 2016-12-07 NOTE — ED Notes (Signed)
Patient transported to CT 

## 2016-12-07 NOTE — ED Notes (Signed)
Pt and family updated on plan of care,  

## 2016-12-07 NOTE — ED Notes (Signed)
Assisted pt to the restroom.  Pain worse with movement.  Pt states pain came after pulling weeds .

## 2016-12-07 NOTE — ED Notes (Signed)
Pt states that she lost her balance while trying to get on the bed two days ago causing her to fall, c/o chest, upper back that started yesterday, abd pain that started today while in er, pain is worse with movement,

## 2016-12-07 NOTE — ED Notes (Signed)
Pt also states that she has started having abd pain while in er,

## 2016-12-12 ENCOUNTER — Other Ambulatory Visit: Payer: Self-pay | Admitting: Family Medicine

## 2016-12-12 NOTE — Telephone Encounter (Signed)
Last seen 11/24/2016

## 2016-12-13 ENCOUNTER — Telehealth: Payer: Self-pay | Admitting: Family Medicine

## 2016-12-13 MED ORDER — HYDROCODONE-ACETAMINOPHEN 5-325 MG PO TABS: ORAL_TABLET | ORAL | 0 refills | Status: DC

## 2016-12-13 NOTE — Telephone Encounter (Signed)
Patient states having hip pain and wanting refill on hydrocodone 5/325

## 2016-12-13 NOTE — Telephone Encounter (Signed)
Tried to call no answer 12/13/16

## 2016-12-13 NOTE — Telephone Encounter (Signed)
May have a refill on hydrocodone 5 mg/325 mg 1 every 8 hours when necessary pain caution drowsiness #20

## 2016-12-19 NOTE — Telephone Encounter (Signed)
Patient notified and stated she will pick it up at her appointment tomorrow.

## 2016-12-20 ENCOUNTER — Ambulatory Visit (INDEPENDENT_AMBULATORY_CARE_PROVIDER_SITE_OTHER): Payer: Medicare Other | Admitting: Family Medicine

## 2016-12-20 ENCOUNTER — Encounter: Payer: Self-pay | Admitting: Family Medicine

## 2016-12-20 VITALS — BP 132/86 | Ht 64.5 in | Wt 161.0 lb

## 2016-12-20 DIAGNOSIS — Z7901 Long term (current) use of anticoagulants: Secondary | ICD-10-CM | POA: Diagnosis not present

## 2016-12-20 DIAGNOSIS — I482 Chronic atrial fibrillation, unspecified: Secondary | ICD-10-CM

## 2016-12-20 MED ORDER — POTASSIUM CHLORIDE ER 10 MEQ PO TBCR: 10.0000 meq | EXTENDED_RELEASE_TABLET | Freq: Every day | ORAL | 4 refills | Status: DC

## 2016-12-20 MED ORDER — METOPROLOL TARTRATE 25 MG PO TABS: ORAL_TABLET | ORAL | 3 refills | Status: DC

## 2016-12-20 NOTE — Progress Notes (Signed)
   Subjective:    Patient ID: [name], female    DOB: 1923-08-15, 83 y.o.   MRN: [id number]  Hypertension  This is a chronic problem. Compliance problems: takes meds every day, eats healthy. pt states she has been eating too much. has gained some weight. not able to exericse.     Went to ED on 7/18 for chest wall pain. Pt has appt with Dr. Lovena Le. Pt states she does not see why she needs to go to him.  Patient relates overall she is doing well not having any chest pain currently she states rarely does her heart race on her when it does she takes a half of a metoprolol she states she is not having any significant shortness breath no PND no orthopnea his had a little bit of swelling in her legs but using Lasix intermittently  Relates compliance with her medicines but states Lasix every other day Review of Systems Denies chest tightness pressure pain shortness breath nausea vomiting diarrhea    Objective:   Physical Exam Lungs are clear heart rate controlled irregular extremities 1-2+ edema in the lower legs no crackles in the lungs blood pressure good  Patient on Xarelto doing well with this. Will monitor for any signs of bleeding.     Assessment & Plan:  HTN continue current measures Atrial fib heart rate controlled metoprolol 25 mg one half twice a day when necessary if tachycardia  Mild pedal edema recommend Lasix 20 mg daily, potassium 10 mEq 1 a day, check metabolic 7 in 4 weeks, follow-up office visit within 3 months

## 2017-01-05 DIAGNOSIS — Z7901 Long term (current) use of anticoagulants: Secondary | ICD-10-CM | POA: Diagnosis not present

## 2017-01-05 DIAGNOSIS — I482 Chronic atrial fibrillation: Secondary | ICD-10-CM | POA: Diagnosis not present

## 2017-01-06 LAB — BASIC METABOLIC PANEL
BUN / CREAT RATIO: 18 (ref 12–28)
BUN: 14 mg/dL (ref 10–36)
CO2: 26 mmol/L (ref 20–29)
Calcium: 9.3 mg/dL (ref 8.7–10.3)
Chloride: 102 mmol/L (ref 96–106)
Creatinine, Ser: 0.79 mg/dL (ref 0.57–1.00)
GFR calc Af Amer: 76 mL/min/{1.73_m2} (ref >59)
GFR calc non Af Amer: 66 mL/min/{1.73_m2} (ref >59)
GLUCOSE: 113 mg/dL — AB (ref 65–99)
POTASSIUM: 4.1 mmol/L (ref 3.5–5.2)
SODIUM: 143 mmol/L (ref 134–144)

## 2017-01-07 ENCOUNTER — Encounter: Payer: Self-pay | Admitting: Family Medicine

## 2017-01-09 ENCOUNTER — Ambulatory Visit: Payer: Medicare Other | Admitting: Internal Medicine

## 2017-01-24 ENCOUNTER — Ambulatory Visit: Payer: Medicare Other | Admitting: Family Medicine

## 2017-01-24 ENCOUNTER — Other Ambulatory Visit: Payer: Self-pay | Admitting: Family Medicine

## 2017-01-24 NOTE — Telephone Encounter (Signed)
Patient last seen 12/20/16

## 2017-02-06 ENCOUNTER — Ambulatory Visit: Payer: Medicare Other | Admitting: Cardiology

## 2017-02-13 ENCOUNTER — Telehealth: Payer: Self-pay | Admitting: Family Medicine

## 2017-02-13 MED ORDER — HYDROCODONE-ACETAMINOPHEN 5-325 MG PO TABS: ORAL_TABLET | ORAL | 0 refills | Status: DC

## 2017-02-13 NOTE — Telephone Encounter (Signed)
Last filled 11/2016

## 2017-02-13 NOTE — Telephone Encounter (Signed)
Prescription up front for pick up. Patient verbalized understanding and scheduled follow up office visit in a few weeks.

## 2017-02-13 NOTE — Telephone Encounter (Signed)
Pt is requesting a refill on  HYDROcodone-acetaminophen (NORCO/VICODIN) 5-325 MG tablet  Pt also states that her feet are still swollen and painful.

## 2017-02-13 NOTE — Telephone Encounter (Signed)
It would be fine to go ahead and refill her hydrocodone. Also I would recommend that the patient move her follow-up office visit to somewhere within the next  2-3 weeks

## 2017-02-14 ENCOUNTER — Other Ambulatory Visit: Payer: Self-pay | Admitting: Cardiology

## 2017-02-23 ENCOUNTER — Ambulatory Visit (INDEPENDENT_AMBULATORY_CARE_PROVIDER_SITE_OTHER): Payer: Medicare Other | Admitting: Family Medicine

## 2017-02-23 ENCOUNTER — Encounter: Payer: Self-pay | Admitting: Family Medicine

## 2017-02-23 VITALS — BP 136/88 | Ht 64.5 in | Wt 158.4 lb

## 2017-02-23 DIAGNOSIS — R300 Dysuria: Secondary | ICD-10-CM | POA: Diagnosis not present

## 2017-02-23 DIAGNOSIS — Z23 Encounter for immunization: Secondary | ICD-10-CM | POA: Diagnosis not present

## 2017-02-23 LAB — POCT URINALYSIS DIPSTICK
Blood, UA: NEGATIVE
Spec Grav, UA: 1.02 (ref 1.010–1.025)
pH, UA: 5 (ref 5.0–8.0)

## 2017-02-23 MED ORDER — CIPROFLOXACIN HCL 250 MG PO TABS: 250.0000 mg | ORAL_TABLET | Freq: Two times a day (BID) | ORAL | 0 refills | Status: DC

## 2017-02-23 NOTE — Progress Notes (Signed)
   Subjective:    Patient ID: [name], female    DOB: [birth year], 83 y.o.   MRN: [id number]  HPI  Patient arrives for a follow up on her chronic foot pain and swelling. Patient with intermittent back pain in the upper back also intermittent right hip pain she thinks she may have urinary tract infection she relates some frequency but no dysuria. Denies fever chills sweats. She uses hydrocodone intermittently takes Tylenol intermittently otherwise she just puts up with pain Review of Systems  Constitutional: Positive for fatigue. Negative for activity change and appetite change.  HENT: Negative for congestion.   Respiratory: Negative for cough.   Cardiovascular: Negative for chest pain.  Gastrointestinal: Negative for abdominal pain.  Endocrine: Negative for polydipsia and polyphagia.  Genitourinary: Negative for dysuria and frequency.  Musculoskeletal: Positive for arthralgias and back pain.  Skin: Negative for color change.  Neurological: Negative for weakness.  Psychiatric/Behavioral: Negative for confusion.       Objective:   Physical Exam  Constitutional: She appears well-developed and well-nourished. No distress.  HENT:  Head: Normocephalic and atraumatic.  Eyes: Right eye exhibits no discharge. Left eye exhibits no discharge.  Neck: No tracheal deviation present.  Cardiovascular: Normal rate, regular rhythm and normal heart sounds.   No murmur heard. Pulmonary/Chest: Effort normal and breath sounds normal. No respiratory distress. She has no wheezes. She has no rales.  Musculoskeletal: She exhibits no edema.  Lymphadenopathy:    She has no cervical adenopathy.  Neurological: She is alert. She exhibits normal muscle tone.  Skin: Skin is warm and dry. No erythema.  Psychiatric: Her behavior is normal.  Vitals reviewed.  15 minutes spent with patient greater than half in discussion of multiple questions   Results for orders placed or performed in visit on 02/23/17    POCT urinalysis dipstick  Result Value Ref Range   Color, UA     Clarity, UA     Glucose, UA     Bilirubin, UA     Ketones, UA     Spec Grav, UA 1.020 1.010 - 1.025   Blood, UA Negative    pH, UA 5.0 5.0 - 8.0   Protein, UA 1+    Urobilinogen, UA  0.2 or 1.0 E.U./dL   Nitrite, UA     Leukocytes, UA Moderate (2+) (A) Negative       Assessment & Plan:  Patient does well with a cane I encouraged her to walk with a cane or use a walker Mild urinary tract infection Cipro twice a day for 5 days Urine culture If high fevers or if worse follow-up Otherwise recheck in about 4 months sooner if any problems she believe she has an appointment later this year she will keep that appointment

## 2017-02-24 ENCOUNTER — Other Ambulatory Visit: Payer: Self-pay | Admitting: Family Medicine

## 2017-02-25 LAB — URINE CULTURE

## 2017-03-14 ENCOUNTER — Ambulatory Visit: Payer: Medicare Other | Admitting: Family Medicine

## 2017-03-15 ENCOUNTER — Ambulatory Visit: Payer: Medicare Other | Admitting: Family Medicine

## 2017-03-29 ENCOUNTER — Ambulatory Visit (INDEPENDENT_AMBULATORY_CARE_PROVIDER_SITE_OTHER): Payer: Medicare Other | Admitting: Cardiology

## 2017-03-29 ENCOUNTER — Encounter: Payer: Self-pay | Admitting: *Deleted

## 2017-03-29 VITALS — BP 160/90 | HR 110 | Ht 64.0 in | Wt 161.2 lb

## 2017-03-29 DIAGNOSIS — I10 Essential (primary) hypertension: Secondary | ICD-10-CM

## 2017-03-29 DIAGNOSIS — R600 Localized edema: Secondary | ICD-10-CM | POA: Diagnosis not present

## 2017-03-29 DIAGNOSIS — I482 Chronic atrial fibrillation, unspecified: Secondary | ICD-10-CM

## 2017-03-29 DIAGNOSIS — Z79899 Other long term (current) drug therapy: Secondary | ICD-10-CM

## 2017-03-29 DIAGNOSIS — R2681 Unsteadiness on feet: Secondary | ICD-10-CM

## 2017-03-29 MED ORDER — METOPROLOL TARTRATE 25 MG PO TABS: ORAL_TABLET | ORAL | 3 refills | Status: DC

## 2017-03-29 MED ORDER — FUROSEMIDE 20 MG PO TABS: 40.0000 mg | ORAL_TABLET | Freq: Every day | ORAL | 3 refills | Status: DC

## 2017-03-29 NOTE — Patient Instructions (Signed)
Medication Instructions:  INCREASE LASIX TO 40 MG DAILY   Labwork: 2 WEEKS  BMET  Testing/Procedures: NONE  Follow-Up: Your physician recommends that you schedule a follow-up appointment in: DR. Lovena Le (ASAP) Your physician recommends that you schedule a follow-up appointment in: 6 WEEKS     Any Other Special Instructions Will Be Listed Below (If Applicable). You have been referred to PHYSICAL THERAPY      If you need a refill on your cardiac medications before your next appointment, please call your pharmacy.

## 2017-03-29 NOTE — Progress Notes (Signed)
Clinical Summary [name] is a 83 y.o.female seen today for follow up of the following medical problems.   1. Afib/Aflutter - metoprolol makes her tired, higher dose made her more fatigued and did not improve her palpitatins.  - changed to dilttiazem due to ongoing palpitations with increased beta blocker dose and fatigue.  - due to side effects and ineffectiveness of AV nodal agents started amiodarone.  - s/p DCCV 03/2016  - pcp recently lowered dilt from 180mg  to 120mg  due to orthostatic symptoms. Patient has mistakingly been taking both a 120mg  and a 180mg  table daily for a total of 300mg  daily. On this dose significant increase in LE edema - patient also with new diagnosis of hypothryoidism, potentially amio related. Started on synthroid by pcp - no recent palpitaitons. severe dizziness, difficulty walking at home.   - still with palpitations at times. Takes prn lopressor as needed    2. LE edema - fairly stable, she is compliant with diuretics  3. Hypothryoid - recent diagnosis, possibly related to amiodarone which she is now off     SH: her sister is also a patient of mine, Hydrographic surveyor    Past Medical History:  Diagnosis Date  . Abdominal pain, chronic, right lower quadrant    "ever since I had my appendix removed"  . Atrial fibrillation ([place])   . Cancer ([place])    left side  . Chronic back pain   . Edema   . GERD (gastroesophageal reflux disease)   . Hyperlipidemia   . Hypertension   . Osteopenia   . Osteoporosis   . Ovarian cyst 02/2015   left  . Recurrent abdominal pain    LLQ     Allergies  Allergen Reactions  . Amiodarone Itching  . Amoxicillin Hives  . Cephalosporins Itching  . Lipitor [Atorvastatin] Other (See Comments)    Unknown:patient is not aware of this allergy  . Lorazepam Other (See Comments)    Exceptional fatigue   . Lovenox [Enoxaparin Sodium] Nausea And Vomiting  . Morphine And Related Itching  .  Penicillins Swelling    Has patient had a PCN reaction causing immediate rash, facial/tongue/throat swelling, SOB or lightheadedness with hypotension: unknown Has patient had a PCN reaction causing severe rash involving mucus membranes or skin necrosis: unknown Has patient had a PCN reaction that required hospitalization No Has patient had a PCN reaction occurring within the last 10 years: no If all of the above answers are "NO", then may proceed with Cephalosporin use.   . Sulfa Antibiotics Swelling     Current Outpatient Medications  Medication Sig Dispense Refill  . ciprofloxacin (CIPRO) 250 MG tablet Take 1 tablet (250 mg total) by mouth 2 (two) times daily. 10 tablet 0  . diltiazem (CARDIZEM CD) 120 MG 24 hr capsule TAKE (1) CAPSULE BY MOUTH ONCE DAILY. 90 capsule 3  . DULoxetine (CYMBALTA) 20 MG capsule TAKE TWO CAPSULES BY MOUTH ONCE DAILY. 60 capsule 5  . furosemide (LASIX) 20 MG tablet One tablet every other day and then 2 tablets every other day (Patient taking differently: Take 20 mg by mouth daily. ) 45 tablet 0  . HYDROcodone-acetaminophen (NORCO/VICODIN) 5-325 MG tablet Take 1 tablet by mouth every 8 hours as needed for pain 20 tablet 0  . levothyroxine (SYNTHROID, LEVOTHROID) 75 MCG tablet Take 1 tablet (75 mcg total) by mouth daily. 30 tablet 5  . metoprolol tartrate (LOPRESSOR) 25 MG tablet 1/2 bid prn tachycardia 30 tablet 3  .  potassium chloride (K-DUR) 10 MEQ tablet Take 1 tablet (10 mEq total) by mouth daily. 30 tablet 4  . rivaroxaban (XARELTO) 20 MG TABS tablet Take 1 tablet (20 mg total) by mouth daily with supper. 30 tablet 1  . traZODone (DESYREL) 50 MG tablet 1/2 qhs prn sleep 15 tablet 3   No current facility-administered medications for this visit.      Past Surgical History:  Procedure Laterality Date  . APPENDECTOMY    . APPENDECTOMY    . BREAST SURGERY    . COLON RESECTION    . COLON SURGERY    . HERNIA REPAIR    . HERNIA REPAIR       Allergies    Allergen Reactions  . Amiodarone Itching  . Amoxicillin Hives  . Cephalosporins Itching  . Lipitor [Atorvastatin] Other (See Comments)    Unknown:patient is not aware of this allergy  . Lorazepam Other (See Comments)    Exceptional fatigue   . Lovenox [Enoxaparin Sodium] Nausea And Vomiting  . Morphine And Related Itching  . Penicillins Swelling    Has patient had a PCN reaction causing immediate rash, facial/tongue/throat swelling, SOB or lightheadedness with hypotension: unknown Has patient had a PCN reaction causing severe rash involving mucus membranes or skin necrosis: unknown Has patient had a PCN reaction that required hospitalization No Has patient had a PCN reaction occurring within the last 10 years: no If all of the above answers are "NO", then may proceed with Cephalosporin use.   . Sulfa Antibiotics Swelling      Family History  Problem Relation Age of Onset  . Diabetes Mother   . Heart disease Unknown   . Arthritis Unknown   . Cancer Unknown   . Hyperlipidemia Brother      Social History Ms. Faso reports that  has never smoked. she has never used smokeless tobacco. Ms. Marcom reports that she does not drink alcohol.   Review of Systems CONSTITUTIONAL: No weight loss, fever, chills, weakness or fatigue.  HEENT: Eyes: No visual loss, blurred vision, double vision or yellow sclerae.No hearing loss, sneezing, congestion, runny nose or sore throat.  SKIN: No rash or itching.  CARDIOVASCULAR: per hpi RESPIRATORY: No shortness of breath, cough or sputum.  GASTROINTESTINAL: No anorexia, nausea, vomiting or diarrhea. No abdominal pain or blood.  GENITOURINARY: No burning on urination, no polyuria NEUROLOGICAL: No headache, dizziness, syncope, paralysis, ataxia, numbness or tingling in the extremities. No change in bowel or bladder control.  MUSCULOSKELETAL: No muscle, back pain, joint pain or stiffness.  LYMPHATICS: No enlarged nodes. No history of  splenectomy.  PSYCHIATRIC: No history of depression or anxiety.  ENDOCRINOLOGIC: No reports of sweating, cold or heat intolerance. No polyuria or polydipsia.  Marland Kitchen   Physical Examination Vitals:   03/29/17 1124 03/29/17 1138  BP: (!) 160/90   Pulse: (!) 116 (!) 110  SpO2: 98%    Vitals:   03/29/17 1124  Weight: 161 lb 3.2 oz (73.1 kg)  Height: 5\' 4"  (1.626 m)    Gen: resting comfortably, no acute distress HEENT: no scleral icterus, pupils equal round and reactive, no palptable cervical adenopathy,  CV: irreg, no m/r/g, no jvd Resp: Clear to auscultation bilaterally GI: abdomen is soft, non-tender, non-distended, normal bowel sounds, no hepatosplenomegaly MSK: extremities are warm, 1+ bilateral LE edema Skin: warm, no rash Neuro:  no focal deficits Psych: appropriate affect     Assessment and Plan  1. Afib/Aflutter - side effects on higher dose AV nodal  agents, did not resolve her symptoms of palpitatoins - started on amiodarone along with DCCV, did well for a period but now back in afib and also evidence of hypothyroidism possibly amio related. Amio stopped.  - followed by EP, thought to be a poor candidate for rhythm control. Continue rate control, could consider av node ablation with pacemaker if refractory - CHADS2Vasc score of 4, continue anticoag  - she will f/u with EP, she missed her last f/u.    2. HTN - elevated but has not taken meds yet today - continue current meds, also follow with increased diuresis as described below.    3. LE edema - ongoing issue, we will increase lasix to 40mg  daily and check BMET in 2 weeks.   4. Hypothyroidism - possibly amio related, we have stopped amio - thyroid replacement per pcp   F/u 6 weeks.      Arnoldo Lenis, M.D., F.A.C.C.

## 2017-04-03 ENCOUNTER — Encounter: Payer: Self-pay | Admitting: Cardiology

## 2017-04-11 ENCOUNTER — Ambulatory Visit (INDEPENDENT_AMBULATORY_CARE_PROVIDER_SITE_OTHER): Payer: Medicare Other | Admitting: Family Medicine

## 2017-04-11 ENCOUNTER — Encounter: Payer: Self-pay | Admitting: Family Medicine

## 2017-04-11 ENCOUNTER — Ambulatory Visit (HOSPITAL_COMMUNITY)
Admission: RE | Admit: 2017-04-11 | Discharge: 2017-04-11 | Disposition: A | Discharge status: Home / Self Care | Payer: Medicare Other | Source: Ambulatory Visit | Attending: Family Medicine | Admitting: Family Medicine

## 2017-04-11 VITALS — BP 132/80 | Ht 64.5 in

## 2017-04-11 DIAGNOSIS — G542 Cervical root disorders, not elsewhere classified: Secondary | ICD-10-CM | POA: Diagnosis not present

## 2017-04-11 DIAGNOSIS — M47812 Spondylosis without myelopathy or radiculopathy, cervical region: Secondary | ICD-10-CM | POA: Diagnosis not present

## 2017-04-11 MED ORDER — CHLORZOXAZONE 500 MG PO TABS: ORAL_TABLET | ORAL | 0 refills | Status: DC

## 2017-04-11 MED ORDER — HYDROCODONE-ACETAMINOPHEN 5-325 MG PO TABS: ORAL_TABLET | ORAL | 0 refills | Status: DC

## 2017-04-11 MED ORDER — RIVAROXABAN 20 MG PO TABS: 20.0000 mg | ORAL_TABLET | Freq: Every day | ORAL | 5 refills | Status: DC

## 2017-04-11 NOTE — Progress Notes (Signed)
   Subjective:    Patient ID: [name], female    DOB: [birth year], 83 y.o.   MRN: [id number]  Gastroesophageal Reflux  This is a chronic problem. The current episode started more than 1 year ago.  Patient relates pain discomfort in the left shoulder area and neck into the trapezius region she denies numbness or tingling down the arms she does not tolerate medicines well including hydrocodone.  Patient relates pain discomfort with movement.  Denies shortness of breath sweats chills denies weakness in the arm.  Patient in today with c/o left shoulder pain.  Review of Systems Denies shortness of breath fever chills cough vomiting diarrhea chest pressure tightness or pain    Objective:   Physical Exam Lungs clear respiratory rate normal heart the rate is controlled extremities no edema has tenderness in the left trapezius region.       Assessment & Plan:  Cervical neck pain with radiation into the left trapezius I recommend physical therapy Also recommend muscle relaxer at nighttime Tylenol during the day X-rays of the neck  Patient is homebound it is too difficult for her to get out she does and would benefit from having home physical therapy.  Face-to-face evaluation was done today in accordance to Medicare guidelines

## 2017-04-14 ENCOUNTER — Telehealth: Payer: Self-pay | Admitting: Family Medicine

## 2017-04-14 NOTE — Telephone Encounter (Signed)
Pt states that she took the Chlorzoxazone at night like Dr. Nicki Reaper recommended, and she was unable to sleep, it kept her up all night and has not helped her pain at all She's cancelling her PT eval for Monday, states she's hurting so bad she don't think she can do it  Please advise

## 2017-04-14 NOTE — Telephone Encounter (Signed)
Patient advised She may discontinue the Parafon forte.  That is the mildest muscle relaxer there is.  May use Tylenol or hydrocodone for pain. Dr Nicki Reaper highly recommend the physical therapy to at least try to help her with her discomfort. Patient verbalized understanding

## 2017-04-14 NOTE — Telephone Encounter (Signed)
She may discontinue the Parafon forte.  That is the mildest muscle relaxer there is.  May use Tylenol or hydrocodone for pain.  I highly recommend the physical therapy to at least try to help her with her discomfort.

## 2017-04-17 ENCOUNTER — Ambulatory Visit (HOSPITAL_COMMUNITY): Payer: Medicare Other | Admitting: Physical Therapy

## 2017-04-17 ENCOUNTER — Telehealth (HOSPITAL_COMMUNITY): Payer: Self-pay | Admitting: Family Medicine

## 2017-04-17 NOTE — Telephone Encounter (Signed)
1126/18  pt left a message to [facility] because she doesn't have anyone that can bring her and would need to make another appt for another time

## 2017-05-09 ENCOUNTER — Ambulatory Visit (INDEPENDENT_AMBULATORY_CARE_PROVIDER_SITE_OTHER): Payer: Medicare Other | Admitting: Family Medicine

## 2017-05-09 ENCOUNTER — Encounter: Payer: Self-pay | Admitting: Family Medicine

## 2017-05-09 VITALS — BP 132/80 | Ht 64.5 in | Wt 155.0 lb

## 2017-05-09 DIAGNOSIS — R634 Abnormal weight loss: Secondary | ICD-10-CM

## 2017-05-09 DIAGNOSIS — M5090 Cervical disc disorder, unspecified, unspecified cervical region: Secondary | ICD-10-CM

## 2017-05-09 DIAGNOSIS — E038 Other specified hypothyroidism: Secondary | ICD-10-CM

## 2017-05-09 DIAGNOSIS — E039 Hypothyroidism, unspecified: Secondary | ICD-10-CM | POA: Insufficient documentation

## 2017-05-09 DIAGNOSIS — Z79899 Other long term (current) drug therapy: Secondary | ICD-10-CM | POA: Diagnosis not present

## 2017-05-09 DIAGNOSIS — L739 Follicular disorder, unspecified: Secondary | ICD-10-CM | POA: Diagnosis not present

## 2017-05-09 DIAGNOSIS — R7301 Impaired fasting glucose: Secondary | ICD-10-CM

## 2017-05-09 MED ORDER — DOXYCYCLINE HYCLATE 100 MG PO CAPS: ORAL_CAPSULE | ORAL | 0 refills | Status: DC

## 2017-05-09 NOTE — Progress Notes (Signed)
   Subjective:    Patient ID: [name], female    DOB: May 31, 1923, 83 y.o.   MRN: [id number]  HPI  Patient is here today to follow up on her neck pain.She states it wakes and wanes with pain. Neck pain and discomfort does not radiate down the arms she does not want to do physical therapy she does not feel it will help her she does use hydrocodone occasionally she denies any bleeding problems confusion on vomiting or falling Review of Systems  Constitutional: Negative for activity change, appetite change and fatigue.  HENT: Negative for congestion.   Respiratory: Negative for cough.   Cardiovascular: Negative for chest pain.  Gastrointestinal: Negative for abdominal pain.  Endocrine: Negative for polydipsia and polyphagia.  Skin: Negative for color change.  Neurological: Negative for weakness.  Psychiatric/Behavioral: Negative for confusion.       Objective:   Physical Exam  Constitutional: She appears well-developed and well-nourished. No distress.  HENT:  Head: Normocephalic and atraumatic.  Eyes: Right eye exhibits no discharge. Left eye exhibits no discharge.  Neck: No tracheal deviation present.  Cardiovascular: Normal rate and normal heart sounds.  No murmur heard. Pulmonary/Chest: Effort normal and breath sounds normal. No respiratory distress. She has no wheezes. She has no rales.  Musculoskeletal: She exhibits no edema.  Lymphadenopathy:    She has no cervical adenopathy.  Neurological: She is alert. She exhibits normal muscle tone.  Skin: Skin is warm and dry. No erythema.  Psychiatric: Her behavior is normal.  Vitals reviewed.         Assessment & Plan:  Mild weight loss probably related to just not eating as many calories but we will check lab work and will have the patient follow-up in a few months if this is progressive she will need further studies  Hypothyroidism check thyroid function previous labs look good this could be related to her weight  loss  Folliculitis of the face probably unrelated to her medicine antibiotics prescribed warning signs discussed  Cervical disc disease-hydrocodone when necessary for pain cautioned drowsiness  Follow-up in 3 months as planned

## 2017-05-10 LAB — CBC WITH DIFFERENTIAL/PLATELET
BASOS: 0 %
Basophils Absolute: 0 10*3/uL (ref 0.0–0.2)
EOS (ABSOLUTE): 0.1 10*3/uL (ref 0.0–0.4)
EOS: 2 %
HEMATOCRIT: 39.1 % (ref 34.0–46.6)
HEMOGLOBIN: 13.1 g/dL (ref 11.1–15.9)
Immature Grans (Abs): 0 10*3/uL (ref 0.0–0.1)
Immature Granulocytes: 0 %
LYMPHS ABS: 2.1 10*3/uL (ref 0.7–3.1)
Lymphs: 24 %
MCH: 28.4 pg (ref 26.6–33.0)
MCHC: 33.5 g/dL (ref 31.5–35.7)
MCV: 85 fL (ref 79–97)
MONOCYTES: 6 %
MONOS ABS: 0.5 10*3/uL (ref 0.1–0.9)
NEUTROS ABS: 6 10*3/uL (ref 1.4–7.0)
Neutrophils: 68 %
Platelets: 291 10*3/uL (ref 150–379)
RBC: 4.61 x10E6/uL (ref 3.77–5.28)
RDW: 13.6 % (ref 12.3–15.4)
WBC: 8.8 10*3/uL (ref 3.4–10.8)

## 2017-05-10 LAB — BASIC METABOLIC PANEL
BUN / CREAT RATIO: 19 (ref 12–28)
BUN: 16 mg/dL (ref 10–36)
CO2: 25 mmol/L (ref 20–29)
CREATININE: 0.84 mg/dL (ref 0.57–1.00)
Calcium: 9.2 mg/dL (ref 8.7–10.3)
Chloride: 99 mmol/L (ref 96–106)
GFR calc Af Amer: 70 mL/min/{1.73_m2} (ref >59)
GFR, EST NON AFRICAN AMERICAN: 61 mL/min/{1.73_m2} (ref >59)
Glucose: 151 mg/dL — ABNORMAL HIGH (ref 65–99)
POTASSIUM: 3.2 mmol/L — AB (ref 3.5–5.2)
SODIUM: 142 mmol/L (ref 134–144)

## 2017-05-10 LAB — HEPATIC FUNCTION PANEL
ALK PHOS: 82 IU/L (ref 39–117)
ALT: 11 IU/L (ref 0–32)
AST: 21 IU/L (ref 0–40)
Albumin: 4.4 g/dL (ref 3.2–4.6)
Bilirubin Total: 0.4 mg/dL (ref 0.0–1.2)
Bilirubin, Direct: 0.13 mg/dL (ref 0.00–0.40)
Total Protein: 7 g/dL (ref 6.0–8.5)

## 2017-05-10 LAB — TSH: TSH: 2.89 u[IU]/mL (ref 0.450–4.500)

## 2017-05-10 NOTE — Addendum Note (Signed)
Addended by: Dairl Ponder on: 05/10/2017 02:41 PM   Modules accepted: Orders

## 2017-05-10 NOTE — Addendum Note (Signed)
Addended by: Dairl Ponder on: 05/10/2017 02:45 PM   Modules accepted: Orders

## 2017-05-12 ENCOUNTER — Encounter: Payer: Self-pay | Admitting: Adult Health

## 2017-05-12 ENCOUNTER — Ambulatory Visit: Payer: Medicare Other | Admitting: Adult Health

## 2017-05-12 VITALS — BP 148/84 | HR 68 | Ht 64.0 in | Wt 156.0 lb

## 2017-05-12 DIAGNOSIS — I10 Essential (primary) hypertension: Secondary | ICD-10-CM | POA: Diagnosis not present

## 2017-05-12 DIAGNOSIS — I480 Paroxysmal atrial fibrillation: Secondary | ICD-10-CM | POA: Diagnosis not present

## 2017-05-12 NOTE — Patient Instructions (Signed)
Medication Instructions:  Your physician recommends that you continue on your current medications as directed. Please refer to the Current Medication list given to you today.   Labwork: NONE   Testing/Procedures: NONE  Follow-Up: Your physician wants you to follow-up in: 6 Months with Dr. Branch. You will receive a reminder letter in the mail two months in advance. If you don't receive a letter, please call our office to schedule the follow-up appointment.   Any Other Special Instructions Will Be Listed Below (If Applicable).     If you need a refill on your cardiac medications before your next appointment, please call your pharmacy. Thank you for choosing [facility]!    

## 2017-05-12 NOTE — Progress Notes (Signed)
Cardiology Office Note   Date:  05/12/2017   ID:  [name], DOB Jan 24, 1924, MRN [id number]  PCP:  [name], MD  Cardiologist: Carlyle Dolly, MD Chief Complaint  Patient presents with  . Atrial Fibrillation     History of Present Illness: [name] is a 83 y.o. female who presents for ongoing assessment and management of A. fib flutter,, status post  DCCV 03/2016, lower extremity edema, hypothyroidism.  Seen by Dr. Harl Bowie on 03/29/2017.  Remains on diltiazem, Xarelto, metoprolol 12.5 mg as needed rapid heart rhythm, hyperlipidemia.  She was to follow-up with electrophysiology for further evaluation of need for ablation, pacemaker if refractory.  She has had to take one half dose of Lopressor x1 earlier this week.  Because she was feeling jittery and thought her heart rate was up.  She denies any bleeding, dizziness, or palpitations.  She is medically compliant.  He was not aware that she had a referral appointment with Dr. Lovena Le.  Past Medical History:  Diagnosis Date  . Abdominal pain, chronic, right lower quadrant    "ever since I had my appendix removed"  . Atrial fibrillation ([place])   . Cancer ([place])    left side  . Chronic back pain   . Edema   . GERD (gastroesophageal reflux disease)   . Hyperlipidemia   . Hypertension   . Osteopenia   . Osteoporosis   . Ovarian cyst 02/2015   left  . Recurrent abdominal pain    LLQ    Past Surgical History:  Procedure Laterality Date  . APPENDECTOMY    . APPENDECTOMY    . BREAST SURGERY    . CARDIOVERSION N/A 03/31/2016   Procedure: CARDIOVERSION;  Surgeon: Josue Hector, MD;  Location: [facility];  Service: Cardiovascular;  Laterality: N/A;  . COLON RESECTION    . COLON SURGERY    . HERNIA REPAIR    . HERNIA REPAIR       Current Outpatient Medications  Medication Sig Dispense Refill  . chlorzoxazone (PARAFON FORTE DSC) 500 MG tablet Take 1 tablet by mouth at bedtime as needed for neck pain 28 tablet 0  .  diltiazem (CARDIZEM CD) 120 MG 24 hr capsule TAKE (1) CAPSULE BY MOUTH ONCE DAILY. 90 capsule 3  . doxycycline (VIBRAMYCIN) 100 MG capsule One 2 times a day for a week with a snack and a glass of water 14 capsule 0  . DULoxetine (CYMBALTA) 20 MG capsule TAKE TWO CAPSULES BY MOUTH ONCE DAILY. 60 capsule 5  . furosemide (LASIX) 20 MG tablet Take 2 tablets (40 mg total) daily by mouth. 90 tablet 3  . HYDROcodone-acetaminophen (NORCO/VICODIN) 5-325 MG tablet Take 1 tablet by mouth every 8 hours as needed for pain 28 tablet 0  . levothyroxine (SYNTHROID, LEVOTHROID) 75 MCG tablet Take 1 tablet (75 mcg total) by mouth daily. 30 tablet 5  . metoprolol tartrate (LOPRESSOR) 25 MG tablet 1/2 bid prn tachycardia 30 tablet 3  . potassium chloride (K-DUR) 10 MEQ tablet Take 1 tablet (10 mEq total) by mouth daily. 30 tablet 4  . rivaroxaban (XARELTO) 20 MG TABS tablet Take 1 tablet (20 mg total) by mouth daily with supper. 30 tablet 5  . traZODone (DESYREL) 50 MG tablet 1/2 qhs prn sleep 15 tablet 3   No current facility-administered medications for this visit.     Allergies:   Amiodarone; Amoxicillin; Cephalosporins; Lipitor [atorvastatin]; Lorazepam; Lovenox [enoxaparin sodium]; Morphine and related; Penicillins; and Sulfa antibiotics  Social History:  The patient  reports that  has never smoked. she has never used smokeless tobacco. She reports that she does not drink alcohol or use drugs.   Family History:  The patient's family history includes Arthritis in her unknown relative; Cancer in her unknown relative; Diabetes in her mother; Heart disease in her unknown relative; Hyperlipidemia in her brother.    ROS: All other systems are reviewed and negative. Unless otherwise mentioned in H&P    PHYSICAL EXAM: VS:  BP (!) 148/84   Pulse 68   Ht 5\' 4"  (1.626 m)   Wt 156 lb (70.8 kg)   SpO2 97%   BMI 26.78 kg/m  , BMI Body mass index is 26.78 kg/m. GEN: Well nourished, well developed, in no acute  distress  HEENT: normal  Neck: no JVD, carotid bruits, or masses Cardiac: IRRR; 1/6 systolic murmurs, rubs, or gallops, mild dependent edema  Respiratory:  Clear to auscultation bilaterally, normal work of breathing GI: soft, nontender, nondistended, + BS MS: no deformity or atrophy  Skin: warm and dry, no rash Neuro:  Strength and sensation are intact Psych: euthymic mood, full affect   Recent Labs: 05/09/2017: ALT 11; BUN 16; Creatinine, Ser 0.84; Hemoglobin 13.1; Platelets 291; Potassium 3.2; Sodium 142; TSH 2.890    Lipid Panel    Component Value Date/Time   CHOL 210 (H) 03/21/2013 0843   TRIG 184 (H) 03/21/2013 0843   HDL 57 03/21/2013 0843   CHOLHDL 3.7 03/21/2013 0843   VLDL 37 03/21/2013 0843   LDLCALC 116 (H) 03/21/2013 0843      Wt Readings from Last 3 Encounters:  05/12/17 156 lb (70.8 kg)  05/09/17 155 lb (70.3 kg)  03/29/17 161 lb 3.2 oz (73.1 kg)      Other studies Reviewed: Echocardiogram 2014/02/15 Left ventricle: The cavity size was normal. Wall thickness was increased in a pattern of mild LVH. Systolic function was normal. The estimated ejection fraction was in the range of 60% to 65%. Wall motion was normal; there were no regional wall motion abnormalities. - Aortic valve: Valve area (VTI): 1.92 cm^2. Valve area (Vmax): 1.95 cm^2. Valve area (Vmean): 2.07 cm^2. - Mitral valve: There was mild regurgitation. - Left atrium: The atrium was moderately dilated. - Right atrium: The atrium was mildly dilated. - Technically adequate study.  ASSESSMENT AND PLAN:  1.  Atrial fibrillation: Patient has had to take 1 extra dose of 25 mg of Lopressor as needed for rapid heart rhythm.  She was not certain that her heart rate was up but she states she felt jittery and thought it would be best to take a extra dose.  She continues on diltiazem 120 mg daily.  She continues on anticoagulation with rivaroxaban.  She is given name of electrophysiologist, Dr.  Crissie Sickles, along with a reminder of referral visit on June 23, 2017, at 9:30 AM.  2.  Hypokalemia: Review of labs completed on 18 December, reveals a potassium of 3.2.  She is on potassium 10 mEq daily but had not been taking it.  She has restarted it within the last 24 hours.  She states that it makes her constipated.  I have advised her to take a stool softener and increase fluids if necessary.  3.  Chronic lower extremity edema: Patient has dependent edema as she is sedentary and is in a wheelchair.  She is advised to keep her legs elevated as much as possible.  I did discuss compression hose.  She  is refusing them at this time.  She continues to take Lasix 40 mg daily with good results.  Creatinine 0.84.  Current medicines are reviewed at length with the patient today.    Labs/ tests ordered today include:  Phill Myron. [place] Pugh, ANP, AACC   05/12/2017 1:33 PM     [facility] [address] [place], [place] [address] Phone: [phone]; Fax: [phone]

## 2017-06-02 ENCOUNTER — Other Ambulatory Visit: Payer: Self-pay | Admitting: Family Medicine

## 2017-06-23 ENCOUNTER — Encounter: Payer: Self-pay | Admitting: *Deleted

## 2017-06-23 ENCOUNTER — Ambulatory Visit: Payer: Medicare Other | Admitting: Internal Medicine

## 2017-06-23 VITALS — BP 182/84 | HR 69 | Ht 64.0 in | Wt 159.4 lb

## 2017-06-23 DIAGNOSIS — I4891 Unspecified atrial fibrillation: Secondary | ICD-10-CM | POA: Diagnosis not present

## 2017-06-23 MED ORDER — METOPROLOL TARTRATE 25 MG PO TABS: 12.5000 mg | ORAL_TABLET | Freq: Two times a day (BID) | ORAL | 3 refills | Status: DC

## 2017-06-23 NOTE — Progress Notes (Signed)
HPI [name] returns today for ongoing evaluation and management of persistent atrial fib and HTN. She is a pleasant elderly woman who we initially placed on amiodarone but for which she became intolerant and was discontinued. I have recommended she be treated with rate control and had her wear a cardiac monitor which demonstrated an ave HR in Atrial fib of 80/min. No prolonged pauses. In the interim she feels well. Her BP is up today.  Allergies  Allergen Reactions  . Amiodarone Itching  . Amoxicillin Hives  . Cephalosporins Itching  . Lipitor [Atorvastatin] Other (See Comments)    Unknown:patient is not aware of this allergy  . Lorazepam Other (See Comments)    Exceptional fatigue   . Lovenox [Enoxaparin Sodium] Nausea And Vomiting  . Morphine And Related Itching  . Penicillins Swelling    Has patient had a PCN reaction causing immediate rash, facial/tongue/throat swelling, SOB or lightheadedness with hypotension: unknown Has patient had a PCN reaction causing severe rash involving mucus membranes or skin necrosis: unknown Has patient had a PCN reaction that required hospitalization No Has patient had a PCN reaction occurring within the last 10 years: no If all of the above answers are "NO", then may proceed with Cephalosporin use.   . Sulfa Antibiotics Swelling     Current Outpatient Medications  Medication Sig Dispense Refill  . diltiazem (CARDIZEM CD) 120 MG 24 hr capsule TAKE (1) CAPSULE BY MOUTH ONCE DAILY. 90 capsule 3  . doxycycline (VIBRAMYCIN) 100 MG capsule One 2 times a day for a week with a snack and a glass of water 14 capsule 0  . DULoxetine (CYMBALTA) 20 MG capsule TAKE TWO CAPSULES BY MOUTH ONCE DAILY. 60 capsule 5  . furosemide (LASIX) 20 MG tablet Take 2 tablets (40 mg total) daily by mouth. 90 tablet 3  . HYDROcodone-acetaminophen (NORCO/VICODIN) 5-325 MG tablet Take 1 tablet by mouth every 8 hours as needed for pain 28 tablet 0  . levothyroxine  (SYNTHROID, LEVOTHROID) 75 MCG tablet TAKE 1 TABLET BY MOUTH ONCE DAILY. 30 tablet 5  . metoprolol tartrate (LOPRESSOR) 25 MG tablet 1/2 bid prn tachycardia 30 tablet 3  . potassium chloride (K-DUR) 10 MEQ tablet Take 1 tablet (10 mEq total) by mouth daily. 30 tablet 4  . rivaroxaban (XARELTO) 20 MG TABS tablet Take 1 tablet (20 mg total) by mouth daily with supper. 30 tablet 5   No current facility-administered medications for this visit.      Past Medical History:  Diagnosis Date  . Abdominal pain, chronic, right lower quadrant    "ever since I had my appendix removed"  . Atrial fibrillation ([place])   . Cancer ([place])    left side  . Chronic back pain   . Edema   . GERD (gastroesophageal reflux disease)   . Hyperlipidemia   . Hypertension   . Osteopenia   . Osteoporosis   . Ovarian cyst 02/2015   left  . Recurrent abdominal pain    LLQ    ROS:   All systems reviewed and negative except as noted in the HPI.   Past Surgical History:  Procedure Laterality Date  . APPENDECTOMY    . APPENDECTOMY    . BREAST SURGERY    . CARDIOVERSION N/A 03/31/2016   Procedure: CARDIOVERSION;  Surgeon: Josue Hector, MD;  Location: [facility];  Service: Cardiovascular;  Laterality: N/A;  . COLON RESECTION    . COLON SURGERY    .  HERNIA REPAIR    . HERNIA REPAIR       Family History  Problem Relation Age of Onset  . Diabetes Mother   . Heart disease Unknown   . Arthritis Unknown   . Cancer Unknown   . Hyperlipidemia Brother      Social History   Socioeconomic History  . Marital status: Widowed    Spouse name: Not on file  . Number of children: 1  . Years of education: Not on file  . Highest education level: Not on file  Social Needs  . Financial resource strain: Not on file  . Food insecurity - worry: Not on file  . Food insecurity - inability: Not on file  . Transportation needs - medical: Not on file  . Transportation needs - non-medical: Not on file  Occupational History     Employer: RETIRED  Tobacco Use  . Smoking status: Never Smoker  . Smokeless tobacco: Never Used  Substance and Sexual Activity  . Alcohol use: No    Alcohol/week: 0.0 oz  . Drug use: No  . Sexual activity: No  Other Topics Concern  . Not on file  Social History Narrative  . Not on file     BP (!) 182/84   Pulse 69   Ht 5\' 4"  (1.626 m)   Wt 159 lb 6.4 oz (72.3 kg)   SpO2 94%   BMI 27.36 kg/m   Physical Exam:  Well appearing elderly woman, NAD HEENT: Unremarkable Neck:  No JVD, no thyromegally Lymphatics:  No adenopathy Back:  No CVA tenderness Lungs:  Clear with no wheezes HEART:  Regular rate rhythm, no murmurs, no rubs, no clicks Abd:  soft, positive bowel sounds, no organomegally, no rebound, no guarding Ext:  2 plus pulses, no edema, no cyanosis, no clubbing Skin:  No rashes no nodules Neuro:  CN II through XII intact, motor grossly intact  EKG - NSR with first degree AV block   Assess/Plan: 1. Atrial fib - she has reverted spontaneously back to NSR. She will continue systemic anti-coagulation. 2. HTN - her blood pressure is up. I checked it myself and the systolic was 948 by me. I have asked the patient to start taking her lopressor twice a day. 3. Coags - she is tolerating xarelto nicely. No bleeding  Mikle Bosworth.D.

## 2017-06-23 NOTE — Patient Instructions (Addendum)
Medication Instructions:  Your physician has recommended you make the following change in your medication:  Increase Lopressor to 12.5 mg Two Times Daily    Labwork: NONE   Testing/Procedures: NONE   Follow-Up: Your physician wants you to follow-up in: 6 Months with Dr. Lovena Le.  You will receive a reminder letter in the mail two months in advance. If you don't receive a letter, please call our office to schedule the follow-up appointment.   Any Other Special Instructions Will Be Listed Below (If Applicable).     If you need a refill on your cardiac medications before your next appointment, please call your pharmacy.  Thank you for choosing [facility]!

## 2017-07-13 ENCOUNTER — Other Ambulatory Visit: Payer: Self-pay | Admitting: Family Medicine

## 2017-07-14 NOTE — Telephone Encounter (Signed)
May have a prescription for 20 tablets 1 every 6 hours as needed severe pain cautioned drowsiness

## 2017-07-16 ENCOUNTER — Emergency Department (HOSPITAL_COMMUNITY): Payer: Medicare Other

## 2017-07-16 ENCOUNTER — Inpatient Hospital Stay (HOSPITAL_COMMUNITY)
Admission: EM | Admit: 2017-07-16 | Discharge: 2017-07-20 | DRG: 066 | Disposition: A | Discharge status: Skilled Nursing Facility | Payer: Medicare Other | Attending: Neurology | Admitting: Neurology

## 2017-07-16 ENCOUNTER — Encounter (HOSPITAL_COMMUNITY): Payer: Self-pay | Admitting: Emergency Medicine

## 2017-07-16 ENCOUNTER — Other Ambulatory Visit: Payer: Self-pay

## 2017-07-16 DIAGNOSIS — S0990XA Unspecified injury of head, initial encounter: Secondary | ICD-10-CM | POA: Diagnosis not present

## 2017-07-16 DIAGNOSIS — F419 Anxiety disorder, unspecified: Secondary | ICD-10-CM | POA: Diagnosis not present

## 2017-07-16 DIAGNOSIS — E785 Hyperlipidemia, unspecified: Secondary | ICD-10-CM

## 2017-07-16 DIAGNOSIS — E538 Deficiency of other specified B group vitamins: Secondary | ICD-10-CM | POA: Diagnosis present

## 2017-07-16 DIAGNOSIS — M6281 Muscle weakness (generalized): Secondary | ICD-10-CM | POA: Diagnosis not present

## 2017-07-16 DIAGNOSIS — K219 Gastro-esophageal reflux disease without esophagitis: Secondary | ICD-10-CM | POA: Diagnosis not present

## 2017-07-16 DIAGNOSIS — I609 Nontraumatic subarachnoid hemorrhage, unspecified: Secondary | ICD-10-CM

## 2017-07-16 DIAGNOSIS — E039 Hypothyroidism, unspecified: Secondary | ICD-10-CM | POA: Diagnosis not present

## 2017-07-16 DIAGNOSIS — I480 Paroxysmal atrial fibrillation: Secondary | ICD-10-CM | POA: Diagnosis not present

## 2017-07-16 DIAGNOSIS — M8580 Other specified disorders of bone density and structure, unspecified site: Secondary | ICD-10-CM | POA: Diagnosis not present

## 2017-07-16 DIAGNOSIS — I10 Essential (primary) hypertension: Secondary | ICD-10-CM | POA: Diagnosis present

## 2017-07-16 DIAGNOSIS — I607 Nontraumatic subarachnoid hemorrhage from unspecified intracranial artery: Secondary | ICD-10-CM | POA: Diagnosis not present

## 2017-07-16 DIAGNOSIS — Z7901 Long term (current) use of anticoagulants: Secondary | ICD-10-CM | POA: Diagnosis not present

## 2017-07-16 DIAGNOSIS — M549 Dorsalgia, unspecified: Secondary | ICD-10-CM | POA: Diagnosis not present

## 2017-07-16 DIAGNOSIS — I160 Hypertensive urgency: Secondary | ICD-10-CM | POA: Diagnosis not present

## 2017-07-16 DIAGNOSIS — E119 Type 2 diabetes mellitus without complications: Secondary | ICD-10-CM | POA: Diagnosis not present

## 2017-07-16 DIAGNOSIS — Z79899 Other long term (current) drug therapy: Secondary | ICD-10-CM

## 2017-07-16 DIAGNOSIS — I619 Nontraumatic intracerebral hemorrhage, unspecified: Secondary | ICD-10-CM | POA: Diagnosis present

## 2017-07-16 DIAGNOSIS — E876 Hypokalemia: Secondary | ICD-10-CM | POA: Diagnosis present

## 2017-07-16 DIAGNOSIS — M810 Age-related osteoporosis without current pathological fracture: Secondary | ICD-10-CM | POA: Diagnosis not present

## 2017-07-16 DIAGNOSIS — I340 Nonrheumatic mitral (valve) insufficiency: Secondary | ICD-10-CM | POA: Diagnosis not present

## 2017-07-16 DIAGNOSIS — I4891 Unspecified atrial fibrillation: Secondary | ICD-10-CM | POA: Diagnosis not present

## 2017-07-16 DIAGNOSIS — I5032 Chronic diastolic (congestive) heart failure: Secondary | ICD-10-CM | POA: Diagnosis not present

## 2017-07-16 DIAGNOSIS — J449 Chronic obstructive pulmonary disease, unspecified: Secondary | ICD-10-CM | POA: Diagnosis not present

## 2017-07-16 DIAGNOSIS — G8929 Other chronic pain: Secondary | ICD-10-CM | POA: Diagnosis not present

## 2017-07-16 DIAGNOSIS — R51 Headache: Secondary | ICD-10-CM | POA: Diagnosis not present

## 2017-07-16 DIAGNOSIS — I608 Other nontraumatic subarachnoid hemorrhage: Secondary | ICD-10-CM | POA: Diagnosis not present

## 2017-07-16 LAB — URINALYSIS, ROUTINE W REFLEX MICROSCOPIC
BILIRUBIN URINE: NEGATIVE
GLUCOSE, UA: NEGATIVE mg/dL
Hgb urine dipstick: NEGATIVE
KETONES UR: NEGATIVE mg/dL
NITRITE: NEGATIVE
PH: 5 (ref 5.0–8.0)
PROTEIN: NEGATIVE mg/dL
Specific Gravity, Urine: 1.012 (ref 1.005–1.030)

## 2017-07-16 LAB — PROTIME-INR
INR: 2.28
PROTHROMBIN TIME: 24.9 s — AB (ref 11.4–15.2)

## 2017-07-16 LAB — CBC
HCT: 38.5 % (ref 36.0–46.0)
Hemoglobin: 12.8 g/dL (ref 12.0–15.0)
MCH: 28.8 pg (ref 26.0–34.0)
MCHC: 33.2 g/dL (ref 30.0–36.0)
MCV: 86.5 fL (ref 78.0–100.0)
Platelets: 256 10*3/uL (ref 150–400)
RBC: 4.45 MIL/uL (ref 3.87–5.11)
RDW: 13.1 % (ref 11.5–15.5)
WBC: 9.6 10*3/uL (ref 4.0–10.5)

## 2017-07-16 LAB — CBC WITH DIFFERENTIAL/PLATELET
Basophils Absolute: 0 10*3/uL (ref 0.0–0.1)
Basophils Relative: 0 %
EOS ABS: 0.1 10*3/uL (ref 0.0–0.7)
EOS PCT: 2 %
HCT: 39.1 % (ref 36.0–46.0)
HEMOGLOBIN: 12.6 g/dL (ref 12.0–15.0)
LYMPHS ABS: 2.1 10*3/uL (ref 0.7–4.0)
Lymphocytes Relative: 27 %
MCH: 28.4 pg (ref 26.0–34.0)
MCHC: 32.2 g/dL (ref 30.0–36.0)
MCV: 88.1 fL (ref 78.0–100.0)
MONO ABS: 0.8 10*3/uL (ref 0.1–1.0)
MONOS PCT: 11 %
Neutro Abs: 4.8 10*3/uL (ref 1.7–7.7)
Neutrophils Relative %: 60 %
PLATELETS: 278 10*3/uL (ref 150–400)
RBC: 4.44 MIL/uL (ref 3.87–5.11)
RDW: 12.7 % (ref 11.5–15.5)
WBC: 7.8 10*3/uL (ref 4.0–10.5)

## 2017-07-16 LAB — BASIC METABOLIC PANEL
Anion gap: 11 (ref 5–15)
BUN: 26 mg/dL — ABNORMAL HIGH (ref 6–20)
CALCIUM: 9.3 mg/dL (ref 8.9–10.3)
CO2: 25 mmol/L (ref 22–32)
CREATININE: 0.87 mg/dL (ref 0.44–1.00)
Chloride: 102 mmol/L (ref 101–111)
GFR calc Af Amer: >60 mL/min (ref >60)
GFR, EST NON AFRICAN AMERICAN: 57 mL/min — AB (ref >60)
GLUCOSE: 96 mg/dL (ref 65–99)
Potassium: 3.4 mmol/L — ABNORMAL LOW (ref 3.5–5.1)
Sodium: 138 mmol/L (ref 135–145)

## 2017-07-16 LAB — APTT: APTT: 48 s — AB (ref 24–36)

## 2017-07-16 LAB — TROPONIN I: Troponin I: <0.03 ng/mL (ref <0.03)

## 2017-07-16 MED ORDER — PANTOPRAZOLE SODIUM 40 MG PO TBEC
40.0000 mg | DELAYED_RELEASE_TABLET | Freq: Every day | ORAL | Status: DC
  Administered 2017-07-17 – 2017-07-20 (×4): 40 mg via ORAL
  Filled 2017-07-16 (×4): qty 1

## 2017-07-16 MED ORDER — ACETAMINOPHEN 325 MG PO TABS
650.0000 mg | ORAL_TABLET | ORAL | Status: DC | PRN
  Administered 2017-07-17 – 2017-07-20 (×7): 650 mg via ORAL
  Filled 2017-07-16 (×7): qty 2

## 2017-07-16 MED ORDER — DOCUSATE SODIUM 100 MG PO CAPS
100.0000 mg | ORAL_CAPSULE | Freq: Two times a day (BID) | ORAL | Status: DC
  Administered 2017-07-17 – 2017-07-20 (×6): 100 mg via ORAL
  Filled 2017-07-16 (×7): qty 1

## 2017-07-16 MED ORDER — ACETAMINOPHEN 160 MG/5ML PO SOLN: 650.0000 mg | ORAL | Status: DC | PRN

## 2017-07-16 MED ORDER — ONDANSETRON HCL 4 MG/2ML IJ SOLN
4.0000 mg | Freq: Four times a day (QID) | INTRAMUSCULAR | Status: DC | PRN
  Filled 2017-07-16 (×2): qty 2

## 2017-07-16 MED ORDER — IOPAMIDOL (ISOVUE-370) INJECTION 76%
75.0000 mL | Freq: Once | INTRAVENOUS | Status: AC | PRN
  Administered 2017-07-16: 18:00:00 via INTRAVENOUS

## 2017-07-16 MED ORDER — PANTOPRAZOLE SODIUM 40 MG PO PACK: 40.0000 mg | PACK | Freq: Every day | ORAL | Status: DC

## 2017-07-16 MED ORDER — ONDANSETRON 4 MG PO TBDP: 4.0000 mg | ORAL_TABLET | Freq: Four times a day (QID) | ORAL | Status: DC | PRN

## 2017-07-16 MED ORDER — ACETAMINOPHEN 650 MG RE SUPP: 650.0000 mg | RECTAL | Status: DC | PRN

## 2017-07-16 MED ORDER — METOPROLOL TARTRATE 25 MG PO TABS
12.5000 mg | ORAL_TABLET | Freq: Once | ORAL | Status: AC
  Administered 2017-07-16: 12.5 mg via ORAL
  Filled 2017-07-16: qty 1

## 2017-07-16 MED ORDER — LABETALOL HCL 5 MG/ML IV SOLN
10.0000 mg | Freq: Once | INTRAVENOUS | Status: AC
  Administered 2017-07-16: 10 mg via INTRAVENOUS
  Filled 2017-07-16: qty 4

## 2017-07-16 MED ORDER — NICARDIPINE HCL IN NACL 20-0.86 MG/200ML-% IV SOLN
3.0000 mg/h | Freq: Once | INTRAVENOUS | Status: AC
  Administered 2017-07-16: 3 mg/h via INTRAVENOUS
  Filled 2017-07-16: qty 200

## 2017-07-16 MED ORDER — NICARDIPINE HCL IN NACL 20-0.86 MG/200ML-% IV SOLN
3.0000 mg/h | INTRAVENOUS | Status: DC
  Administered 2017-07-16 – 2017-07-17 (×3): 5 mg/h via INTRAVENOUS
  Filled 2017-07-16 (×2): qty 200

## 2017-07-16 MED ORDER — NICARDIPINE HCL IN NACL 20-0.86 MG/200ML-% IV SOLN: 3.0000 mg/h | Freq: Once | INTRAVENOUS | Status: AC

## 2017-07-16 MED ORDER — STROKE: EARLY STAGES OF RECOVERY BOOK
Freq: Once | Status: AC
  Administered 2017-07-16: 23:00:00
  Filled 2017-07-16: qty 1

## 2017-07-16 MED ORDER — LABETALOL HCL 5 MG/ML IV SOLN
5.0000 mg | Freq: Once | INTRAVENOUS | Status: AC
  Administered 2017-07-16: 5 mg via INTRAVENOUS
  Filled 2017-07-16: qty 4

## 2017-07-16 NOTE — ED Notes (Signed)
Pt informed she cannot have anything to eat at this time.

## 2017-07-16 NOTE — H&P (Signed)
Neurology H&P  CC: I just do not feel well  History is obtained from: Patient  HPI: [name] is a 84 y.o. female with a history of A. fib, hypertension, hyperlipidemia who states that she is just not been feeling well for the past few days.  She also has been complaining of headache for at least 3 or 4 days.  She denies any serious head trauma, but states she might have bumped it a couple of times over the past few days without really thinking anything of it.  LKW: 3 days ago tpa given?: no, ICH Modified Rankin Scale: 0-Completely asymptomatic and back to baseline post- stroke  ROS: A 14 point ROS was performed and is negative except as noted in the HPI.   Past Medical History:  Diagnosis Date  . Abdominal pain, chronic, right lower quadrant    "ever since I had my appendix removed"  . Atrial fibrillation ([place])   . Cancer ([place])    left side  . Chronic back pain   . Edema   . GERD (gastroesophageal reflux disease)   . Hyperlipidemia   . Hypertension   . Osteopenia   . Osteoporosis   . Ovarian cyst 02/2015   left  . Recurrent abdominal pain    LLQ     Family History  Problem Relation Age of Onset  . Diabetes Mother   . Heart disease Unknown   . Arthritis Unknown   . Cancer Unknown   . Hyperlipidemia Brother      Social History:  reports that  has never smoked. she has never used smokeless tobacco. She reports that she does not drink alcohol or use drugs.   Exam: Current vital signs: BP (!) 142/65   Pulse 70   Temp 98.7 F (37.1 C) (Oral)   Resp 16   Ht 5\' 4"  (1.626 m)   Wt 72.6 kg (160 lb)   SpO2 94%   BMI 27.46 kg/m  Vital signs in last 24 hours: Temp:  [98.7 F (37.1 C)-99.1 F (37.3 C)] 98.7 F (37.1 C) (02/24 2220) Pulse Rate:  [60-74] 70 (02/24 2300) Resp:  [13-26] 16 (02/24 2300) BP: (142-197)/(61-137) 142/65 (02/24 2300) SpO2:  [93 %-98 %] 94 % (02/24 2300) Weight:  [72.6 kg (160 lb)] 72.6 kg (160 lb) (02/24 1418)  Physical Exam   Constitutional: Appears well-developed and well-nourished.  Psych: Affect appropriate to situation Eyes: No scleral injection HENT: No OP obstrucion Head: Normocephalic.  Cardiovascular: Normal rate and regular rhythm.  Respiratory: Effort normal and breath sounds normal to anterior ascultation GI: Soft.  No distension. There is no tenderness.  Skin: WDI  Neuro: Mental Status: Patient is awake, alert, oriented to person, place, month, year, and situation. Patient is able to give a clear and coherent history. No signs of aphasia or neglect Cranial Nerves: II: Visual Fields are full. Pupils are equal, round, and reactive to light.   III,IV, VI: EOMI without ptosis or diploplia.  V: Facial sensation is symmetric to temperature VII: Facial movement is symmetric.  VIII: hearing is intact to voice X: Uvula elevates symmetrically XI: Shoulder shrug is symmetric. XII: tongue is midline without atrophy or fasciculations.  Motor: Tone is normal. Bulk is normal. 5/5 strength was present in all four extremities.  Sensory: Sensation is symmetric to light touch and temperature in the arms and legs. Cerebellar: FNF and HKS are intact bilaterally  I have reviewed labs in epic and the results pertinent to this consultation are:  INR 2.2  I have reviewed the images obtained: CT head-tiny sulcal hyperdensity, ? sah  Primary Diagnosis:  Subarachnoid hemorrhage   Secondary Diagnosis: Accelerated hypertension(SBP > 180 or DBP > 11) & end organ damage) and Paroxysmal atrial fibrillation   Impression: 84 year old female who presents with what I suspect is really a hypertensive emergency.  The tiny sulcal SAH could be due to her hitting her head at some point without really recognizing it.  Certainly we will need to hold Xarelto at this time.  I would favor checking an MRI to confirm that this truly is SAH given that it means holding her Xarelto  Recommendations: 1) discontinue Xarelto 2)  restart home antihypertensives 3) nicardipine for now given accelerated hypertension 4) MRI brain 5) restart home Synthroid for hypothyroidism 6) restart home Lasix 7) for hypokalemia, give one-time 20 mEq of potassium, resume home potassium 10 mg once daily   This patient is critically ill and at significant risk of neurological worsening, death and care requires constant monitoring of vital signs, hemodynamics,respiratory and cardiac monitoring, neurological assessment, discussion with family, other specialists and medical decision making of high complexity. I spent 35 minutes of neurocritical care time  in the care of  this patient.  Roland Rack, MD [facility] [phone]  If 7pm- 7am, please page neurology on call as listed in [place]. 07/16/2017  11:58 PM

## 2017-07-16 NOTE — ED Triage Notes (Signed)
Pt c/o HTN, since last week, also has HA and generalized weakness. Readings at home as high as 190/88. States she took all her BP medications today.

## 2017-07-16 NOTE — ED Provider Notes (Signed)
[facility] EMERGENCY DEPARTMENT Provider Note   CSN: [id number] Arrival date & time: 07/16/17  1412     History   Chief Complaint Chief Complaint  Patient presents with  . Hypertension    HPI [name] is a 84 y.o. female.   Hypertension     Pt was seen at 1550. Per pt, c/o gradual onset and persistence of constant "high blood pressure" for the past 3 days. Pt states she has been taking her BP multiple times per day for the past 3 days because "I didn't feel well." Pt has difficulty explaining how she does not feel well. Pt states she feels "like everything is moving around" when she moves from sitting to standing. Pt states she also feels generally weak. Pt was instructed to take her lopressor BID on 06/23/2017 Cards MD office visit but has not. Pt denies any specific complaint otherwise. Denies CP/SOB, no palpitations, no back pain, no abd pain, no N/V/D, no recent injury, no headache, no visual changes, no focal motor weakness, no tingling/numbness in extremities, no ataxia, no slurred speech, no facial droop.    Past Medical History:  Diagnosis Date  . Abdominal pain, chronic, right lower quadrant    "ever since I had my appendix removed"  . Atrial fibrillation ([place])   . Cancer ([place])    left side  . Chronic back pain   . Edema   . GERD (gastroesophageal reflux disease)   . Hyperlipidemia   . Hypertension   . Osteopenia   . Osteoporosis   . Ovarian cyst 02/2015   left  . Recurrent abdominal pain    LLQ    Patient Active Problem List   Diagnosis Date Noted  . Cervical disc disease 05/09/2017  . Hypothyroid 05/09/2017  . Major depression in partial remission ([place]) 03/17/2016  . Atrial fibrillation ([place]) 01/12/2016  . Osteoarthritis of right knee 12/15/2015  . Major depression 09/22/2015  . Ventral hernia 05/21/2015  . Ovarian tumor 04/13/2015  . Squamous cell skin cancer 08/13/2014  . Impaired fasting glucose 08/05/2013  . Neuropathy 01/11/2013  . Long  term (current) use of anticoagulants 07/27/2012  . Atrial fibrillation with RVR ([place]) 01/29/2012  . Anxiety 01/29/2012  . GERD (gastroesophageal reflux disease) 01/29/2012  . Chronic anticoagulation 01/29/2012  . Sciatica of right side 05/10/2011    Past Surgical History:  Procedure Laterality Date  . APPENDECTOMY    . APPENDECTOMY    . BREAST SURGERY    . CARDIOVERSION N/A 03/31/2016   Procedure: CARDIOVERSION;  Surgeon: Josue Hector, MD;  Location: [facility];  Service: Cardiovascular;  Laterality: N/A;  . COLON RESECTION    . COLON SURGERY    . HERNIA REPAIR    . HERNIA REPAIR      OB History    Gravida Para Term Preterm AB Living   2 1     1 1    SAB TAB Ectopic Multiple Live Births   1               Home Medications    Prior to Admission medications   Medication Sig Start Date End Date Taking? Authorizing Provider  diltiazem (CARDIZEM CD) 120 MG 24 hr capsule TAKE (1) CAPSULE BY MOUTH ONCE DAILY. 02/14/17   Arnoldo Lenis, MD  doxycycline (VIBRAMYCIN) 100 MG capsule One 2 times a day for a week with a snack and a glass of water 05/09/17   Luking, Elayne Snare, MD  DULoxetine (CYMBALTA) 20 MG  capsule TAKE TWO CAPSULES BY MOUTH ONCE DAILY. 02/24/17   [name], MD  furosemide (LASIX) 20 MG tablet Take 2 tablets (40 mg total) daily by mouth. 03/29/17 06/27/17  Arnoldo Lenis, MD  HYDROcodone-acetaminophen (NORCO/VICODIN) 5-325 MG tablet TAKE 1 TABLET BY MOUTH EVERY 6 HOURS AS NEEDED SEVERE PAIN. CAUTION, MAY CAUSE DROWSINESS. 07/14/17   [name], MD  levothyroxine (SYNTHROID, LEVOTHROID) 75 MCG tablet TAKE 1 TABLET BY MOUTH ONCE DAILY. 06/02/17   [name], MD  metoprolol tartrate (LOPRESSOR) 25 MG tablet Take 0.5 tablets (12.5 mg total) by mouth 2 (two) times daily. 06/23/17 09/21/17  Evans Lance, MD  potassium chloride (K-DUR) 10 MEQ tablet Take 1 tablet (10 mEq total) by mouth daily. 12/20/16   [name], MD  rivaroxaban (XARELTO) 20 MG TABS tablet Take  1 tablet (20 mg total) by mouth daily with supper. 04/11/17   [name], MD    Family History Family History  Problem Relation Age of Onset  . Diabetes Mother   . Heart disease Unknown   . Arthritis Unknown   . Cancer Unknown   . Hyperlipidemia Brother     Social History Social History   Tobacco Use  . Smoking status: Never Smoker  . Smokeless tobacco: Never Used  Substance Use Topics  . Alcohol use: No    Alcohol/week: 0.0 oz  . Drug use: No     Allergies   Amiodarone; Amoxicillin; Cephalosporins; Lipitor [atorvastatin]; Lorazepam; Lovenox [enoxaparin sodium]; Morphine and related; Penicillins; and Sulfa antibiotics   Review of Systems Review of Systems ROS: Statement: All systems negative except as marked or noted in the HPI; Constitutional: Negative for fever and chills. +generalized weakness, "sense of moving."; ; Eyes: Negative for eye pain, redness and discharge. ; ; ENMT: Negative for ear pain, hoarseness, nasal congestion, sinus pressure and sore throat. ; ; Cardiovascular: Negative for chest pain, palpitations, diaphoresis, dyspnea and peripheral edema. ; ; Respiratory: Negative for cough, wheezing and stridor. ; ; Gastrointestinal: Negative for nausea, vomiting, diarrhea, abdominal pain, blood in stool, hematemesis, jaundice and rectal bleeding. . ; ; Genitourinary: Negative for dysuria, flank pain and hematuria. ; ; Musculoskeletal: Negative for back pain and neck pain. Negative for swelling and trauma.; ; Skin: Negative for pruritus, rash, abrasions, blisters, bruising and skin lesion.; ; Neuro: Negative for headache, lightheadedness and neck stiffness. Negative for altered level of consciousness, altered mental status, extremity weakness, paresthesias, involuntary movement, seizure and syncope.      Physical Exam Updated Vital Signs BP (!) 190/74   Pulse 60   Temp 99.1 F (37.3 C) (Oral)   Resp 17   Ht 5\' 4"  (1.626 m)   Wt 72.6 kg (160 lb)   SpO2 93%    BMI 27.46 kg/m   Patient Vitals for the past 24 hrs:  BP Temp Temp src Pulse Resp SpO2 Height Weight  07/16/17 2120 (!) 162/64 - - 66 16 96 % - -  07/16/17 2111 (!) 183/61 - - 63 16 98 % - -  07/16/17 2030 (!) 176/62 - - 63 17 93 % - -  07/16/17 2000 (!) 193/68 - - 68 (!) 24 95 % - -  07/16/17 1930 (!) 178/92 - - 65 13 97 % - -  07/16/17 1900 (!) 191/77 - - 74 20 97 % - -  07/16/17 1852 (!) 190/74 - - - 17 - - -  07/16/17 1800 (!) 185/90 - - 60 19 93 % - -  07/16/17 1730 (!) 192/80 - - 62 15 96 % - -  07/16/17 1705 (!) 193/69 - - - - - - -  07/16/17 1700 (!) 193/69 - - - - - - -  07/16/17 1645 - - - 65 (!) 26 97 % - -  07/16/17 1630 (!) 197/79 - - 63 13 96 % - -  07/16/17 1629 (!) 197/79 - - 69 16 96 % - -  07/16/17 1615 (!) 179/76 - - - 13 - - -  07/16/17 1600 (!) 183/86 - - - - - - -  07/16/17 1418 (!) 161/137 99.1 F (37.3 C) Oral 74 18 97 % 5\' 4"  (1.626 m) 72.6 kg (160 lb)     Physical Exam 1555: Physical examination:  Nursing notes reviewed; Vital signs and O2 SAT reviewed;  Constitutional: Well developed, Well nourished, Well hydrated, In no acute distress; Head:  Normocephalic, atraumatic; Eyes: EOMI, PERRL, No scleral icterus; ENMT: Mouth and pharynx normal, Mucous membranes moist; Neck: Supple, Full range of motion, No lymphadenopathy; Cardiovascular: Regular rate and rhythm, No gallop; Respiratory: Breath sounds clear & equal bilaterally, No wheezes.  Speaking full sentences with ease, Normal respiratory effort/excursion; Chest: Nontender, Movement normal; Abdomen: Soft, Nontender, Nondistended, Normal bowel sounds; Genitourinary: No CVA tenderness; Spine:  No midline CS, TS, LS tenderness.;; Extremities: Pulses normal, No tenderness, No edema, No calf edema or asymmetry.; Neuro: AA&Ox3, rambling/tangential historian. Major CN grossly intact. Speech clear.  No facial droop.  No nystagmus. Grips equal. Strength 5/5 equal bilat UE's and LE's.  DTR 2/4 equal bilat UE's and LE's.   No gross sensory deficits.  Normal cerebellar testing bilat UE's (finger-nose) and LE's (heel-shin). ; Skin: Color normal, Warm, Dry.   ED Treatments / Results  Labs (all labs ordered are listed, but only abnormal results are displayed)   EKG  EKG Interpretation  Date/Time:  Sunday July 16 2017 16:13:58 EST Ventricular Rate:  63 PR Interval:    QRS Duration: 99 QT Interval:  470 QTC Calculation: 482 R Axis:   16 Text Interpretation:  Sinus rhythm Borderline prolonged PR interval Baseline wander When compared with ECG of 12/07/2016 Normal sinus rhythm has replaced Atrial fibrillation Confirmed by Francine Graven [phone]) on 07/16/2017 4:42:20 PM       Radiology   Procedures Procedures (including critical care time)  Medications Ordered in ED Medications  nicardipine (CARDENE) 20mg  in 0.86% saline 211ml IV infusion (0.1 mg/ml) (not administered)  metoprolol tartrate (LOPRESSOR) tablet 12.5 mg (12.5 mg Oral Given 07/16/17 1705)  labetalol (NORMODYNE,TRANDATE) injection 5 mg (5 mg Intravenous Given 07/16/17 1853)  iopamidol (ISOVUE-370) 76 % injection 75 mL ( Intravenous Contrast Given 07/16/17 1825)  labetalol (NORMODYNE,TRANDATE) injection 10 mg (10 mg Intravenous Given 07/16/17 1929)     Initial Impression / Assessment and Plan / ED Course  I have reviewed the triage vital signs and the nursing notes.  Pertinent labs & imaging results that were available during my care of the patient were reviewed by me and considered in my medical decision making (see chart for details).  MDM Reviewed: nursing note, previous chart and vitals Reviewed previous: labs and ECG Interpretation: labs, ECG, x-ray and CT scan Total time providing critical care: 75-105 minutes. This excludes time spent performing separately reportable procedures and services. Consults: neurology and admitting MD   CRITICAL CARE Performed by: Alfonzo Feller Total critical care time: 85 minutes Critical  care time was exclusive of separately billable procedures and treating other patients. Critical care was  necessary to treat or prevent imminent or life-threatening deterioration. Critical care was time spent personally by me on the following activities: development of treatment plan with patient and/or surrogate as well as nursing, discussions with consultants, evaluation of patient's response to treatment, examination of patient, obtaining history from patient or surrogate, ordering and performing treatments and interventions, ordering and review of laboratory studies, ordering and review of radiographic studies, pulse oximetry and re-evaluation of patient's condition.   Results for orders placed or performed during the hospital encounter of 07/16/17  Urinalysis, Routine w reflex microscopic  Result Value Ref Range   Color, Urine YELLOW YELLOW   APPearance CLEAR CLEAR   Specific Gravity, Urine 1.012 1.005 - 1.030   pH 5.0 5.0 - 8.0   Glucose, UA NEGATIVE NEGATIVE mg/dL   Hgb urine dipstick NEGATIVE NEGATIVE   Bilirubin Urine NEGATIVE NEGATIVE   Ketones, ur NEGATIVE NEGATIVE mg/dL   Protein, ur NEGATIVE NEGATIVE mg/dL   Nitrite NEGATIVE NEGATIVE   Leukocytes, UA SMALL (A) NEGATIVE   RBC / HPF 0-5 0 - 5 RBC/hpf   WBC, UA 6-30 0 - 5 WBC/hpf   Bacteria, UA RARE (A) NONE SEEN   Squamous Epithelial / LPF 0-5 (A) NONE SEEN   Mucus PRESENT    Hyaline Casts, UA PRESENT   Basic metabolic panel  Result Value Ref Range   Sodium 138 135 - 145 mmol/L   Potassium 3.4 (L) 3.5 - 5.1 mmol/L   Chloride 102 101 - 111 mmol/L   CO2 25 22 - 32 mmol/L   Glucose, Bld 96 65 - 99 mg/dL   BUN 26 (H) 6 - 20 mg/dL   Creatinine, Ser 0.87 0.44 - 1.00 mg/dL   Calcium 9.3 8.9 - 10.3 mg/dL   GFR calc non Af Amer 57 (L) >60 mL/min   GFR calc Af Amer >60 >60 mL/min   Anion gap 11 5 - 15  Troponin I  Result Value Ref Range   Troponin I <0.03 <0.03 ng/mL  CBC with Differential  Result Value Ref Range   WBC 7.8  4.0 - 10.5 K/uL   RBC 4.44 3.87 - 5.11 MIL/uL   Hemoglobin 12.6 12.0 - 15.0 g/dL   HCT 39.1 36.0 - 46.0 %   MCV 88.1 78.0 - 100.0 fL   MCH 28.4 26.0 - 34.0 pg   MCHC 32.2 30.0 - 36.0 g/dL   RDW 12.7 11.5 - 15.5 %   Platelets 278 150 - 400 K/uL   Neutrophils Relative % 60 %   Neutro Abs 4.8 1.7 - 7.7 K/uL   Lymphocytes Relative 27 %   Lymphs Abs 2.1 0.7 - 4.0 K/uL   Monocytes Relative 11 %   Monocytes Absolute 0.8 0.1 - 1.0 K/uL   Eosinophils Relative 2 %   Eosinophils Absolute 0.1 0.0 - 0.7 K/uL   Basophils Relative 0 %   Basophils Absolute 0.0 0.0 - 0.1 K/uL   Ct Angio Head W/cm &/or Wo Cm Result Date: 07/16/2017 CLINICAL DATA:  Right parietal subarachnoid hemorrhage. EXAM: CT ANGIOGRAPHY HEAD TECHNIQUE: Multidetector CT imaging of the head was performed using the standard protocol during bolus administration of intravenous contrast. Multiplanar CT image reconstructions and MIPs were obtained to evaluate the vascular anatomy. CONTRAST:  <See Chart> ISOVUE-370 IOPAMIDOL (ISOVUE-370) INJECTION 76% COMPARISON:  CT head without contrast from the same day. MRI brain and MRA [facility] 09/08/2008 FINDINGS: CTA HEAD Anterior circulation: The internal carotid arteries are within normal limits from the high cervical segments through  the ICA termini bilaterally. The right A1 segment is aplastic. Both A2 segments fill from the left with a patent anterior communicating artery. The left A1 segment is normal. M1 segments are within normal limits bilaterally. The MCA bifurcations are unremarkable. ACA and MCA branch vessels are within normal limits. Posterior circulation: The right vertebral artery is slightly dominant to the left. There is a fenestration at the vertebrobasilar junction without aneurysm. Basilar artery is otherwise normal. Right posterior cerebral artery is of fetal type with a small right P1 segment. The left posterior cerebral artery originates from the basilar tip. Moderate narrowing  is present in the proximal left P2 segment. Distal PCA branch vessels are intact bilaterally. Venous sinuses: The dural sinuses are patent bilaterally. The left transverse sinus is dominant straight sinus and deep cerebral veins are intact. Cortical veins are unremarkable. Anatomic variants: Fetal type right posterior cerebral artery. Delayed phase: Delayed images demonstrate no pathologic enhancement. Focal hyperdensity in the right parietal lobe is stable. IMPRESSION: 1. No focal vascular lesion to explain subarachnoid hemorrhage. 2. Focal hyperdensity in the right parietal lobe, suspected subarachnoid hemorrhage, is stable. 3. Moderate narrowing of the proximal left P2 segment. 4. No other significant proximal stenosis, aneurysm, or branch vessel occlusion. Electronically Signed   By: San Morelle M.D.   On: 07/16/2017 19:01   Dg Chest 2 View Result Date: 07/16/2017 CLINICAL DATA:  Hypertension.  Headache.  Weakness. EXAM: CHEST  2 VIEW COMPARISON:  12/07/2016 FINDINGS: Borderline enlargement of the cardiopericardial silhouette. Tortuous aorta with atherosclerotic calcification of the aortic arch and descending thoracic aorta. There is bandlike opacity at the left lung base which appears mildly increased from the 12/07/2016, and probably represents combination of atelectasis and scarring. The lungs appear otherwise clear. No pleural effusion. Mild thoracic spondylosis. IMPRESSION: 1. Bandlike opacity at the left lung base is increased compared to July 2018. Given the configuration, a combination of scarring and atelectasis is favored. 2.  Aortic Atherosclerosis (ICD10-I70.0). Electronically Signed   By: Van Clines M.D.   On: 07/16/2017 17:30   Ct Head Wo Contrast Result Date: 07/16/2017 CLINICAL DATA:  Hypertension.  Headache.  Weakness. EXAM: CT HEAD WITHOUT CONTRAST TECHNIQUE: Contiguous axial images were obtained from the base of the skull through the vertex without intravenous contrast.  COMPARISON:  02/12/2013 FINDINGS: Brain: Periventricular white matter and corona radiata hypodensities favor chronic ischemic microvascular white matter disease. On image 22/2 and on images 18-22 of series 5 there subtle density along a sulcal margin which was not apparent on 02/12/2013. Otherwise, The brainstem, cerebellum, cerebral peduncles, thalami, basal ganglia, basilar cisterns, and ventricular system appear within normal limits. No mass lesion or acute CVA identified. Vascular: Unremarkable Skull: Unremarkable Sinuses/Orbits: Unremarkable Other: No supplemental non-categorized findings. IMPRESSION: 1. Subtle hyperdensity along a sulcal margin in the right parietal lobe on image 22/2 is suspicious for a trace amount of subarachnoid hemorrhage. A venous angioma can sometimes give a similar appearance but this density was not present on 02/12/2013, and a small amount of subarachnoid hemorrhage is favored. 2. Periventricular white matter and corona radiata hypodensities favor chronic ischemic microvascular white matter disease. Critical Value/emergent results were called by telephone at the time of interpretation on 07/16/2017 at 5:28 pm to Dr. Francine Graven , who verbally acknowledged these results. Electronically Signed   By: Van Clines M.D.   On: 07/16/2017 17:28    1745: CT head as above. Neuro exam remains intact.  T/C returned from [facility] Dr. Domingo Cocking, case discussed, including:  HPI, pertinent PM/SHx, VS/PE, dx testing, ED course and treatment:  No neurosurgical issue at this time, recommends CT-A head, MRI brain, keep SBP 140-160's, admit and consult Neuro tomorrow. Pt's usual lopressor given. Will dose IV labetalol.  1950:  BP improved then increasing again; will give another dose IV labetalol and consider IV cardene gtt if no sustained improvement of BP after 2nd dose. Neuro exam continues unchanged/intact.   2010:  No significant change in BP after 2nd dose IV labetalol; IV cardene  gtt started.  T/C returned from [facility] Neuro Dr. Leonel Ramsay, case discussed, including:  HPI, pertinent PM/SHx, VS/PE, dx testing, ED course and treatment:  Agreeable to accept transfer/admit. Dx and testing d/w pt and family.  Questions answered.  Verb understanding, agreeable to admit/transfer to [facility].   2130:  BP improving on IV cardene gtt. Pt continues without change in neuro status.    Final Clinical Impressions(s) / ED Diagnoses   Final diagnoses:  None    ED Discharge Orders    None       Francine Graven, DO 07/18/17 2155

## 2017-07-16 NOTE — ED Notes (Signed)
Pt was informed that we need a urine sample. Pt states that she can not urinate at this time. Pt was told to let me know when she has to urinate.

## 2017-07-17 ENCOUNTER — Inpatient Hospital Stay (HOSPITAL_COMMUNITY): Payer: Medicare Other

## 2017-07-17 ENCOUNTER — Encounter (HOSPITAL_COMMUNITY): Payer: Self-pay | Admitting: General Practice

## 2017-07-17 DIAGNOSIS — E785 Hyperlipidemia, unspecified: Secondary | ICD-10-CM

## 2017-07-17 DIAGNOSIS — I609 Nontraumatic subarachnoid hemorrhage, unspecified: Secondary | ICD-10-CM

## 2017-07-17 DIAGNOSIS — I160 Hypertensive urgency: Secondary | ICD-10-CM

## 2017-07-17 DIAGNOSIS — I340 Nonrheumatic mitral (valve) insufficiency: Secondary | ICD-10-CM

## 2017-07-17 DIAGNOSIS — I480 Paroxysmal atrial fibrillation: Secondary | ICD-10-CM

## 2017-07-17 LAB — ECHOCARDIOGRAM COMPLETE
Ao-asc: 31 cm
Area-P 1/2: 4.23 cm2
E decel time: 176 msec
EERAT: 15.11
FS: 35 % (ref 28–44)
Height: 64 in
IVS/LV PW RATIO, ED: 0.98
LA ID, A-P, ES: 43 mm
LA diam end sys: 43 mm
LA diam index: 2.35 cm/m2
LA vol: 72.6 mL
LAVOLA4C: 74.7 mL
LAVOLIN: 39.7 mL/m2
LDCA: 2.54 cm2
LV E/e'average: 15.11
LV TDI E'LATERAL: 7.61
LVEEMED: 15.11
LVELAT: 7.61 cm/s
LVOT diameter: 18 mm
MV Dec: 176
MV pk A vel: 115 m/s
MVPG: 5 mmHg
MVPKEVEL: 115 m/s
P 1/2 time: 52 ms
PW: 10.9 mm — AB (ref 0.6–1.1)
RV LATERAL S' VELOCITY: 14.1 cm/s
RV TAPSE: 26.3 mm
TDI e' medial: 6.67
Weight: 2560 oz

## 2017-07-17 LAB — RAPID URINE DRUG SCREEN, HOSP PERFORMED
Amphetamines: NOT DETECTED
Barbiturates: NOT DETECTED
Benzodiazepines: NOT DETECTED
Cocaine: NOT DETECTED
Opiates: NOT DETECTED
TETRAHYDROCANNABINOL: NOT DETECTED

## 2017-07-17 LAB — MRSA PCR SCREENING: MRSA by PCR: NEGATIVE

## 2017-07-17 MED ORDER — METOPROLOL TARTRATE 25 MG PO TABS
25.0000 mg | ORAL_TABLET | Freq: Two times a day (BID) | ORAL | Status: DC
  Administered 2017-07-17 – 2017-07-18 (×4): 25 mg via ORAL
  Filled 2017-07-17 (×3): qty 1

## 2017-07-17 MED ORDER — POTASSIUM CHLORIDE CRYS ER 10 MEQ PO TBCR
10.0000 meq | EXTENDED_RELEASE_TABLET | Freq: Every day | ORAL | Status: DC
  Administered 2017-07-18 – 2017-07-20 (×3): 10 meq via ORAL
  Filled 2017-07-17 (×3): qty 1

## 2017-07-17 MED ORDER — FUROSEMIDE 40 MG PO TABS
40.0000 mg | ORAL_TABLET | Freq: Every day | ORAL | Status: DC
  Filled 2017-07-17: qty 1

## 2017-07-17 MED ORDER — METOPROLOL TARTRATE 25 MG PO TABS
12.5000 mg | ORAL_TABLET | Freq: Two times a day (BID) | ORAL | Status: DC
  Filled 2017-07-17: qty 1

## 2017-07-17 MED ORDER — LABETALOL HCL 5 MG/ML IV SOLN
10.0000 mg | INTRAVENOUS | Status: DC | PRN
  Administered 2017-07-17 – 2017-07-18 (×2): 10 mg via INTRAVENOUS
  Filled 2017-07-17: qty 4

## 2017-07-17 MED ORDER — POTASSIUM CHLORIDE CRYS ER 20 MEQ PO TBCR
20.0000 meq | EXTENDED_RELEASE_TABLET | Freq: Once | ORAL | Status: AC
  Administered 2017-07-17: 20 meq via ORAL
  Filled 2017-07-17: qty 1

## 2017-07-17 MED ORDER — DULOXETINE HCL 20 MG PO CPEP
40.0000 mg | ORAL_CAPSULE | Freq: Every day | ORAL | Status: DC
  Filled 2017-07-17: qty 2

## 2017-07-17 MED ORDER — DILTIAZEM HCL ER COATED BEADS 120 MG PO CP24
120.0000 mg | ORAL_CAPSULE | Freq: Every day | ORAL | Status: DC
  Administered 2017-07-17 – 2017-07-20 (×4): 120 mg via ORAL
  Filled 2017-07-17 (×4): qty 1

## 2017-07-17 MED ORDER — LEVOTHYROXINE SODIUM 75 MCG PO TABS
75.0000 ug | ORAL_TABLET | Freq: Every day | ORAL | Status: DC
  Administered 2017-07-17 – 2017-07-20 (×4): 75 ug via ORAL
  Filled 2017-07-17 (×4): qty 1

## 2017-07-17 NOTE — Progress Notes (Signed)
  Echocardiogram 2D Echocardiogram has been performed.  Nashla Althoff G Vanna Sailer 07/17/2017, 12:20 PM

## 2017-07-17 NOTE — Progress Notes (Signed)
PT Cancellation Note  Patient Details Name: [name] MRN: [id number] DOB: [birth year]   Cancelled Treatment:    Reason Eval/Treat Not Completed: Active bedrest order   Kolby Schara B Noreene Boreman 07/17/2017, 7:11 AM  Elwyn Reach, [place]

## 2017-07-17 NOTE — Evaluation (Signed)
Physical Therapy Evaluation Patient Details Name: [name] MRN: [id number] DOB: [birth year] Today's Date: 07/17/2017   History of Present Illness  84 yo admitted not feeling well with trace SAH right frontal. PMhx: HTN, HLD, afib, GERD  Clinical Impression  Pt initially pleasant on arrival with pt sliding down in chair. Pt assisted to put legs down and stating she lives at home with intermittent assist of cousins and family with assist for meals. Pt reports she uses rollator at home and stays by herself. Pt unable to use problem solving with gait to locate room or follow direction of numbers and when error pointed out pt very agitated and explosive with pushing RW away and needing assist and cues to safely return to room. Pt with inattention to left with gait and activity, flipping RW and running therapist into wall. Pt with decreased balance, safety, problem solving and function who is unsafe to be home alone and requires assist for mobility and function. Pt will benefit from acute therapy to maximize mobility, gait and safety to decrease burden of care and fall risk.     Follow Up Recommendations SNF;Supervision/Assistance - 24 hour    Equipment Recommendations  Rolling walker with 5" wheels    Recommendations for Other Services       Precautions / Restrictions Precautions Precautions: Fall Precaution Comments: easily agitated with corrections Restrictions Weight Bearing Restrictions: (P) No      Mobility  Bed Mobility               General bed mobility comments: pt in chair on arrival  Transfers Overall transfer level: Needs assistance   Transfers: Sit to/from Stand Sit to Stand: Min guard         General transfer comment: guarding for safety and lines. With return to chair pt sitting impulsively, flipping rW over with left hand with no awareness it was even there  Ambulation/Gait Ambulation/Gait assistance: Min assist Ambulation Distance (Feet): 170  Feet Assistive device: Rolling walker (2 wheeled) Gait Pattern/deviations: Step-through pattern;Decreased stride length Gait velocity: varied between too quick for safety and slow Gait velocity interpretation: Below normal speed for age/gender General Gait Details: pt with initial gait very quick with unsafe function with cues to stop, position self in RW and slow down. Pt running into door to right leaving room, cues for direction during gait then veering RW to left cutting off therapist with gait. Near room pt stated fatigue and cued pt for room number and she proceeded to go the wrong way. With cues for problem solving pt became agitated pushing RW away and unsafe with gait with assist to return to room  Stairs            Wheelchair Mobility    Modified Rankin (Stroke Patients Only)       Balance Overall balance assessment: Needs assistance   Sitting balance-Leahy Scale: Fair       Standing balance-Leahy Scale: Poor                               Pertinent Vitals/Pain Pain Assessment: No/denies pain    Home Living Family/patient expects to be discharged to:: Private residence Living Arrangements: Alone Available Help at Discharge: Family;Friend(s);Available PRN/intermittently Type of Home: House       Home Layout: One level Home Equipment: Walker - 4 wheels Additional Comments: (P) Pt reports that her sons and cousins as well as hired caregiver assist intermittently.  Prior Function Level of Independence: Needs assistance   Gait / Transfers Assistance Needed: pt uses rollator at home but self-reports she forgets it at times  ADL's / Homemaking Assistance Needed: sponge bathes, meals on wheels, or family brings meals  Comments: doesn't drive     Hand Dominance   Dominant Hand: (P) Right    Extremity/Trunk Assessment   Upper Extremity Assessment Upper Extremity Assessment: Defer to OT evaluation    Lower Extremity Assessment Lower  Extremity Assessment: Generalized weakness    Cervical / Trunk Assessment Cervical / Trunk Assessment: Kyphotic  Communication   Communication: HOH  Cognition Arousal/Alertness: Awake/alert Behavior During Therapy: Agitated;Impulsive Overall Cognitive Status: Impaired/Different from baseline Area of Impairment: Orientation;Safety/judgement;Following commands;Problem solving;Awareness                 Orientation Level: Time Current Attention Level: (P) Sustained   Following Commands: Follows one step commands inconsistently Safety/Judgement: (P) Decreased awareness of deficits;Decreased awareness of safety Awareness: Intellectual Problem Solving: Difficulty sequencing;Requires verbal cues General Comments: not aware of day, decreased awareness of LUE, running into obstacles on right and left, easily agitated when given directional cues for problem solving, pt impulsive with initial gait and practically running with no regard for RW placement      General Comments      Exercises     Assessment/Plan    PT Assessment Patient needs continued PT services  PT Problem List Decreased strength;Decreased mobility;Decreased safety awareness;Decreased activity tolerance;Decreased cognition;Decreased balance;Decreased knowledge of use of DME;Decreased coordination       PT Treatment Interventions Gait training;Therapeutic exercise;Patient/family education;Functional mobility training;DME instruction;Therapeutic activities;Cognitive remediation;Balance training;Neuromuscular re-education    PT Goals (Current goals can be found in the Care Plan section)  Acute Rehab PT Goals Patient Stated Goal: return home PT Goal Formulation: With patient Time For Goal Achievement: 07/31/17 Potential to Achieve Goals: Fair    Frequency Min 3X/week   Barriers to discharge Decreased caregiver support      Co-evaluation PT/OT/SLP Co-Evaluation/Treatment: Yes Reason for Co-Treatment:  Complexity of the patient's impairments (multi-system involvement);For patient/therapist safety PT goals addressed during session: Mobility/safety with mobility;Proper use of DME        AM-PAC PT "6 Clicks" Daily Activity  Outcome Measure Difficulty turning over in bed (including adjusting bedclothes, sheets and blankets)?: A Little Difficulty moving from lying on back to sitting on the side of the bed? : A Little Difficulty sitting down on and standing up from a chair with arms (e.g., wheelchair, bedside commode, etc,.)?: A Little Help needed moving to and from a bed to chair (including a wheelchair)?: A Little Help needed walking in hospital room?: A Lot Help needed climbing 3-5 steps with a railing? : A Lot 6 Click Score: 16    End of Session Equipment Utilized During Treatment: Gait belt Activity Tolerance: Treatment limited secondary to agitation Patient left: in chair;with call bell/phone within reach;with chair alarm set;with nursing/sitter in room Nurse Communication: Mobility status;Precautions PT Visit Diagnosis: Other abnormalities of gait and mobility (R26.89);Unsteadiness on feet (R26.81);Other symptoms and signs involving the nervous system (R29.898)    Time: 7106-2694 PT Time Calculation (min) (ACUTE ONLY): 14 min   Charges:   PT Evaluation $PT Eval Moderate Complexity: 1 Mod     PT G Codes:        Elwyn Reach, PT [phone]   Dhriti Fales B Verdie Barrows 07/17/2017, 11:49 AM

## 2017-07-17 NOTE — Progress Notes (Signed)
STROKE TEAM PROGRESS NOTE   SUBJECTIVE (INTERVAL HISTORY) Her RN is at the bedside.  Overall she feels her condition is stable. She stated that for the last a couple of days, she had frontal bilateral nagging pain. She did not feel well. She had small amount of vomiting last night. Today she feels fine. No significant HA, not more vomiting. CT concerns for trace SAH right frontal, CTA neg and MRI neg for [facility] right frontal. BP still high on cardene.    OBJECTIVE Temp:  [97.7 F (36.5 C)-99.1 F (37.3 C)] 97.7 F (36.5 C) (02/25 0804) Pulse Rate:  [60-85] 85 (02/25 0900) Cardiac Rhythm: Normal sinus rhythm (02/25 0803) Resp:  [13-26] 21 (02/25 0900) BP: (133-197)/(45-137) 171/60 (02/25 0900) SpO2:  [89 %-98 %] 97 % (02/25 0900) Weight:  [160 lb (72.6 kg)] 160 lb (72.6 kg) (02/24 1418)  No results for input(s): GLUCAP in the last 168 hours. Recent Labs  Lab 07/16/17 1624  NA 138  K 3.4*  CL 102  CO2 25  GLUCOSE 96  BUN 26*  CREATININE 0.87  CALCIUM 9.3   No results for input(s): AST, ALT, ALKPHOS, BILITOT, PROT, ALBUMIN in the last 168 hours. Recent Labs  Lab 07/16/17 1624 07/16/17 2257  WBC 7.8 9.6  NEUTROABS 4.8  --   HGB 12.6 12.8  HCT 39.1 38.5  MCV 88.1 86.5  PLT 278 256   Recent Labs  Lab 07/16/17 1624  TROPONINI <0.03   Recent Labs    07/16/17 2257  LABPROT 24.9*  INR 2.28   Recent Labs    07/16/17 1603  COLORURINE YELLOW  LABSPEC 1.012  PHURINE 5.0  GLUCOSEU NEGATIVE  HGBUR NEGATIVE  BILIRUBINUR NEGATIVE  KETONESUR NEGATIVE  PROTEINUR NEGATIVE  NITRITE NEGATIVE  LEUKOCYTESUR SMALL*       Component Value Date/Time   CHOL 210 (H) 03/21/2013 0843   TRIG 184 (H) 03/21/2013 0843   HDL 57 03/21/2013 0843   CHOLHDL 3.7 03/21/2013 0843   VLDL 37 03/21/2013 0843   LDLCALC 116 (H) 03/21/2013 0843   Lab Results  Component Value Date   HGBA1C 5.1 09/22/2015      Component Value Date/Time   LABOPIA NONE DETECTED 07/17/2017 0748    COCAINSCRNUR NONE DETECTED 07/17/2017 0748   LABBENZ NONE DETECTED 07/17/2017 0748   AMPHETMU NONE DETECTED 07/17/2017 0748   THCU NONE DETECTED 07/17/2017 0748   LABBARB NONE DETECTED 07/17/2017 0748    No results for input(s): ETH in the last 168 hours.  I have personally reviewed the radiological images below and agree with the radiology interpretations.  Ct Angio Head W/cm &/or Wo Cm  Result Date: 07/16/2017 CLINICAL DATA:  Right parietal subarachnoid hemorrhage. EXAM: CT ANGIOGRAPHY HEAD TECHNIQUE: Multidetector CT imaging of the head was performed using the standard protocol during bolus administration of intravenous contrast. Multiplanar CT image reconstructions and MIPs were obtained to evaluate the vascular anatomy. CONTRAST:  <See Chart> ISOVUE-370 IOPAMIDOL (ISOVUE-370) INJECTION 76% COMPARISON:  CT head without contrast from the same day. MRI brain and MRA [facility] 09/08/2008 FINDINGS: CTA HEAD Anterior circulation: The internal carotid arteries are within normal limits from the high cervical segments through the ICA termini bilaterally. The right A1 segment is aplastic. Both A2 segments fill from the left with a patent anterior communicating artery. The left A1 segment is normal. M1 segments are within normal limits bilaterally. The MCA bifurcations are unremarkable. ACA and MCA branch vessels are within normal limits. Posterior circulation: The right vertebral artery  is slightly dominant to the left. There is a fenestration at the vertebrobasilar junction without aneurysm. Basilar artery is otherwise normal. Right posterior cerebral artery is of fetal type with a small right P1 segment. The left posterior cerebral artery originates from the basilar tip. Moderate narrowing is present in the proximal left P2 segment. Distal PCA branch vessels are intact bilaterally. Venous sinuses: The dural sinuses are patent bilaterally. The left transverse sinus is dominant straight sinus and deep  cerebral veins are intact. Cortical veins are unremarkable. Anatomic variants: Fetal type right posterior cerebral artery. Delayed phase: Delayed images demonstrate no pathologic enhancement. Focal hyperdensity in the right parietal lobe is stable. IMPRESSION: 1. No focal vascular lesion to explain subarachnoid hemorrhage. 2. Focal hyperdensity in the right parietal lobe, suspected subarachnoid hemorrhage, is stable. 3. Moderate narrowing of the proximal left P2 segment. 4. No other significant proximal stenosis, aneurysm, or branch vessel occlusion. Electronically Signed   By: San Morelle M.D.   On: 07/16/2017 19:01   Dg Chest 2 View  Result Date: 07/16/2017 CLINICAL DATA:  Hypertension.  Headache.  Weakness. EXAM: CHEST  2 VIEW COMPARISON:  12/07/2016 FINDINGS: Borderline enlargement of the cardiopericardial silhouette. Tortuous aorta with atherosclerotic calcification of the aortic arch and descending thoracic aorta. There is bandlike opacity at the left lung base which appears mildly increased from the 12/07/2016, and probably represents combination of atelectasis and scarring. The lungs appear otherwise clear. No pleural effusion. Mild thoracic spondylosis. IMPRESSION: 1. Bandlike opacity at the left lung base is increased compared to July 2018. Given the configuration, a combination of scarring and atelectasis is favored. 2.  Aortic Atherosclerosis (ICD10-I70.0). Electronically Signed   By: Van Clines M.D.   On: 07/16/2017 17:30   Ct Head Wo Contrast  Result Date: 07/16/2017 CLINICAL DATA:  Hypertension.  Headache.  Weakness. EXAM: CT HEAD WITHOUT CONTRAST TECHNIQUE: Contiguous axial images were obtained from the base of the skull through the vertex without intravenous contrast. COMPARISON:  02/12/2013 FINDINGS: Brain: Periventricular white matter and corona radiata hypodensities favor chronic ischemic microvascular white matter disease. On image 22/2 and on images 18-22 of series 5  there subtle density along a sulcal margin which was not apparent on 02/12/2013. Otherwise, The brainstem, cerebellum, cerebral peduncles, thalami, basal ganglia, basilar cisterns, and ventricular system appear within normal limits. No mass lesion or acute CVA identified. Vascular: Unremarkable Skull: Unremarkable Sinuses/Orbits: Unremarkable Other: No supplemental non-categorized findings. IMPRESSION: 1. Subtle hyperdensity along a sulcal margin in the right parietal lobe on image 22/2 is suspicious for a trace amount of subarachnoid hemorrhage. A venous angioma can sometimes give a similar appearance but this density was not present on 02/12/2013, and a small amount of subarachnoid hemorrhage is favored. 2. Periventricular white matter and corona radiata hypodensities favor chronic ischemic microvascular white matter disease. Critical Value/emergent results were called by telephone at the time of interpretation on 07/16/2017 at 5:28 pm to Dr. Francine Graven , who verbally acknowledged these results. Electronically Signed   By: Van Clines M.D.   On: 07/16/2017 17:28   Mr Brain Wo Contrast  Result Date: 07/17/2017 CLINICAL DATA:  Possible head injury this week. Follow-up suspected subarachnoid hemorrhage. EXAM: MRI HEAD WITHOUT CONTRAST TECHNIQUE: Multiplanar, multiecho pulse sequences of the brain and surrounding structures were obtained without intravenous contrast. COMPARISON:  CT HEAD July 16, 2017 FINDINGS: INTRACRANIAL CONTENTS: No reduced diffusion to suggest acute ischemia. No susceptibility artifact with particular attention to subarachnoid space. Moderate to severe parenchymal brain volume loss.  No hydrocephalus. Patchy supratentorial white matter FLAIR T2 hyperintensities. No midline shift, mass effect or masses. VASCULAR: Normal major intracranial vascular flow voids present at skull base. SKULL AND UPPER CERVICAL SPINE: No abnormal sellar expansion. No suspicious calvarial bone marrow  signal. Craniocervical junction maintained. SINUSES/ORBITS: Trace sphenoid sinus mucosal thickening. Mastoid air cells are well aerated. Included ocular globes and orbital contents are non-suspicious. Status post bilateral ocular lens implants. OTHER: None. IMPRESSION: 1. No acute intracranial process. No MR findings of subarachnoid hemorrhage though, CT is more sensitive in the acute setting. 2. Moderate to severe parenchymal brain volume loss. Mild chronic small vessel ischemic disease. Electronically Signed   By: Elon Alas M.D.   On: 07/17/2017 05:31    Carotid Doppler  pending  TTE pending   PHYSICAL EXAM  Temp:  [97.7 F (36.5 C)-99.1 F (37.3 C)] 97.7 F (36.5 C) (02/25 0804) Pulse Rate:  [60-85] 85 (02/25 0900) Resp:  [13-26] 21 (02/25 0900) BP: (133-197)/(45-137) 171/60 (02/25 0900) SpO2:  [89 %-98 %] 97 % (02/25 0900) Weight:  [160 lb (72.6 kg)] 160 lb (72.6 kg) (02/24 1418)  General - Well nourished, well developed, in no apparent distress.  Ophthalmologic - fundi not visualized due to noncooperation.  Cardiovascular - Regular rate and rhythm with no murmur.  Mental Status -  Level of arousal and orientation to time, place, and person were intact. Language including expression, naming, repetition, comprehension was assessed and found intact. Fund of Knowledge was assessed and was intact.  Cranial Nerves II - XII - II - Visual field intact OU. III, IV, VI - Extraocular movements intact. V - Facial sensation intact bilaterally. VII - Facial movement intact bilaterally. VIII - Hearing & vestibular intact bilaterally. X - Palate elevates symmetrically. XI - Chin turning & shoulder shrug intact bilaterally. XII - Tongue protrusion intact.  Motor Strength - The patient's strength was normal in all extremities except right knee pain on flexion and pronator drift was absent.  Bulk was normal and fasciculations were absent.   Motor Tone - Muscle tone was assessed at  the neck and appendages and was normal.  Reflexes - The patient's reflexes were symmetrical in all extremities and she had no pathological reflexes.  Sensory - Light touch, temperature/pinprick were assessed and were symmetrical.    Coordination - The patient had normal movements in the hands with no ataxia or dysmetria.  Tremor was absent.  Gait and Station - deferred.   ASSESSMENT/PLAN [name] is a 84 y.o. female with history of afib on Xarelto, HTN, HLD admitted for HA for 2-3 days, not feeling well. No tPA given due to [facility] and on Xarelto    ? Trace SAH right frontal - suspicious on CT but not demonstrated on MRI   Resultant HA  CT head concerning for trace SAH right frontal  MRI  No infarct, no SAH  CTA head no aneurysm, left P2 narrowing  Repeat CT in am  Carotid Doppler  pending  2D Echo  pending  LDL pending  HgbA1c pending  UDS neg  SCDs for VTE prophylaxis  Fall precautions  Diet regular Room service appropriate? Yes; Fluid consistency: Thin   Xarelto (rivaroxaban) daily prior to admission, now on No antithrombotic due to concerns of SAH  Ongoing aggressive stroke risk factor management  Therapy recommendations:  pending  Disposition:  pending  PAF  On Xarelto at home, currently on hold  Currently in SR  Continue hold off Xarelto  Repeat CT in am  Hypertension Stable BP goal < 160 On home cardizem and metoprolol On cardene Taper off cardene as able Increase metoprolol to 25mg  bid  Long term BP goal normotensive  Hyperlipidemia  Home meds:  none   LDL pending, goal < 70  Other Stroke Risk Factors  Advanced age  Other Active Problems  INR 2.28 - due to Xarelto  Hypokalemia - supplement  Hospital day # 1  This patient is critically ill due to concerns of SAH, hypertensive emergency, PAF and at significant risk of neurological worsening, death form recurrent SAH, recurrent stroke, heart failure, cerebral edema.  This patient's care requires constant monitoring of vital signs, hemodynamics, respiratory and cardiac monitoring, review of multiple databases, neurological assessment, discussion with family, other specialists and medical decision making of high complexity. I spent 40 minutes of neurocritical care time in the care of this patient.  Rosalin Hawking, MD PhD Stroke Neurology 07/17/2017 9:54 AM    To contact Stroke Continuity provider, please refer to [email or web address] After hours, contact General Neurology

## 2017-07-17 NOTE — Progress Notes (Signed)
Attempted to call MRI, they are busy at the moment and will call back when ready. RN will continue to monitor

## 2017-07-17 NOTE — Evaluation (Signed)
Occupational Therapy Evaluation Patient Details Name: [name] MRN: [id number] DOB: 07/19/1923 Today's Date: 07/17/2017    History of Present Illness 84 yo admitted not feeling well with trace SAH right frontal. PMhx: HTN, HLD, afib, GERD   Clinical Impression   Pt admitted with above. She demonstrates the below listed deficits and will benefit from continued OT to maximize safety and independence with BADLs.  Pt seen with PT.  She presents with Lt inattention, impaired problem solving, decreased safety awareness, and poor awareness of deficits.   She became very agitated, when cues provided to assist her in finding her room number.  She currently requires min A for ADLs, and min A +2 for safety with functional mobility.  She reports she lives alone with intermittent assist from family and caregiver.  Based on eval, feel she will need 24 hour supervision/assist at discharge.  IF family unable to provide this level of care, recommend SNF level rehab at discharge.   Will follow acutely.      Follow Up Recommendations  SNF;Supervision/Assistance - 24 hour    Equipment Recommendations  3 in 1 bedside commode    Recommendations for Other Services       Precautions / Restrictions Precautions Precautions: Fall Precaution Comments: easily agitated with corrections Restrictions Weight Bearing Restrictions: No      Mobility Bed Mobility               General bed mobility comments: pt in chair on arrival  Transfers Overall transfer level: Needs assistance   Transfers: Sit to/from Stand Sit to Stand: Min guard Stand pivot transfers: Min guard;Min assist;+2 safety/equipment       General transfer comment: guarding for safety and lines. With return to chair pt sitting impulsively, flipping rW over with left hand with no awareness it was even there    Balance Overall balance assessment: Needs assistance Sitting-balance support: Feet supported Sitting balance-Leahy Scale:  Fair     Standing balance support: Single extremity supported Standing balance-Leahy Scale: Poor                             ADL either performed or assessed with clinical judgement   ADL Overall ADL's : Needs assistance/impaired Eating/Feeding: Supervision/ safety;Sitting   Grooming: Wash/dry hands;Wash/dry face;Oral care;Brushing hair;Minimal assistance;Standing   Upper Body Bathing: Supervision/ safety;Sitting   Lower Body Bathing: Minimal assistance;Sit to/from stand   Upper Body Dressing : Minimal assistance;Sitting   Lower Body Dressing: Minimal assistance;Sit to/from stand   Toilet Transfer: Minimal assistance;+2 for safety/equipment;Ambulation;Comfort height toilet;RW   Toileting- Clothing Manipulation and Hygiene: Minimal assistance;Sit to/from stand       Functional mobility during ADLs: Minimal assistance;Rolling walker General ADL Comments: Pt with Lt inattention/neglect, as well as poor problem solving, and poor safety awareness      Vision   Additional Comments: Pt would not participate in formal visual assessment      Perception Perception Perception Tested?: Yes Perception Deficits: Inattention/neglect Inattention/Neglect: Does not attend to left visual field;Does not attend to left side of body Spatial deficits: Pt noted to run into items on Lt with no awareness.  or attempt to correct  Pt flipped her walker with with her Lt hand which was still on the walker, then stated "I didn't even know that was there"      Praxis      Pertinent Vitals/Pain Pain Assessment: No/denies pain     Hand Dominance Right  Extremity/Trunk Assessment Upper Extremity Assessment Upper Extremity Assessment: Defer to OT evaluation   Lower Extremity Assessment Lower Extremity Assessment: Generalized weakness   Cervical / Trunk Assessment Cervical / Trunk Assessment: Kyphotic   Communication Communication Communication: HOH   Cognition  Arousal/Alertness: Awake/alert Behavior During Therapy: Agitated;Impulsive Overall Cognitive Status: Impaired/Different from baseline Area of Impairment: Orientation;Safety/judgement;Following commands;Problem solving;Awareness                 Orientation Level: Time Current Attention Level: Sustained   Following Commands: Follows one step commands inconsistently Safety/Judgement: Decreased awareness of deficits;Decreased awareness of safety Awareness: Intellectual Problem Solving: Difficulty sequencing;Requires verbal cues General Comments: not aware of day, decreased awareness of LUE, running into obstacles on right and left, easily agitated when given directional cues for problem solving, pt impulsive with initial gait and practically running with no regard for RW placement   General Comments       Exercises     Shoulder Instructions      Home Living Family/patient expects to be discharged to:: Private residence Living Arrangements: Alone Available Help at Discharge: Family;Friend(s);Available PRN/intermittently Type of Home: House       Home Layout: One level               Home Equipment: Walker - 4 wheels   Additional Comments: Pt reports that her sons and cousins as well as hired caregiver assist intermittently.         Prior Functioning/Environment Level of Independence: Needs assistance  Gait / Transfers Assistance Needed: pt uses rollator at home but self-reports she forgets it at times ADL's / Homemaking Assistance Needed: sponge bathes, meals on wheels, or family brings meals   Comments: doesn't drive        OT Problem List: Decreased strength;Decreased activity tolerance;Impaired balance (sitting and/or standing);Impaired vision/perception;Decreased coordination;Decreased safety awareness;Decreased cognition;Decreased knowledge of use of DME or AE      OT Treatment/Interventions: Self-care/ADL training;Neuromuscular education;DME and/or AE  instruction;Therapeutic activities;Cognitive remediation/compensation;Visual/perceptual remediation/compensation;Patient/family education;Balance training    OT Goals(Current goals can be found in the care plan section) Acute Rehab OT Goals Patient Stated Goal: return home OT Goal Formulation: With patient Time For Goal Achievement: 07/31/17 Potential to Achieve Goals: Good ADL Goals Pt Will Perform Grooming: with supervision;standing Pt Will Perform Upper Body Bathing: with supervision;sitting Pt Will Perform Lower Body Bathing: with supervision;sit to/from stand Pt Will Perform Upper Body Dressing: with supervision;sitting Pt Will Perform Lower Body Dressing: with supervision;sit to/from stand Pt Will Transfer to Toilet: with supervision;ambulating;regular height toilet;grab bars Pt Will Perform Toileting - Clothing Manipulation and hygiene: with supervision;sit to/from stand Additional ADL Goal #1: Pt will locate items on Lt with min cues during ADLs  OT Frequency: Min 2X/week   Barriers to D/C: Decreased caregiver support          Co-evaluation PT/OT/SLP Co-Evaluation/Treatment: Yes Reason for Co-Treatment: Complexity of the patient's impairments (multi-system involvement);For patient/therapist safety PT goals addressed during session: Mobility/safety with mobility;Proper use of DME OT goals addressed during session: ADL's and self-care      AM-PAC PT "6 Clicks" Daily Activity     Outcome Measure Help from another person eating meals?: A Little Help from another person taking care of personal grooming?: A Little Help from another person toileting, which includes using toliet, bedpan, or urinal?: A Little Help from another person bathing (including washing, rinsing, drying)?: A Little Help from another person to put on and taking off regular upper body clothing?: A Little Help from another  person to put on and taking off regular lower body clothing?: A Little 6 Click Score:  18   End of Session Equipment Utilized During Treatment: Rolling walker Nurse Communication: Mobility status  Activity Tolerance: Patient limited by fatigue Patient left: in chair;with call bell/phone within reach;with chair alarm set;with nursing/sitter in room  OT Visit Diagnosis: Unsteadiness on feet (R26.81);Cognitive communication deficit (R41.841) Symptoms and signs involving cognitive functions: Nontraumatic SAH                Time: 9432-0037 OT Time Calculation (min): 13 min Charges:  OT General Charges $OT Visit: 1 Visit OT Evaluation $OT Eval Moderate Complexity: 1 Mod G-Codes:     [facility], OTR/L [phone]   Lucille Passy M 07/17/2017, 12:20 PM

## 2017-07-18 ENCOUNTER — Inpatient Hospital Stay (HOSPITAL_COMMUNITY): Payer: Medicare Other

## 2017-07-18 ENCOUNTER — Encounter (HOSPITAL_COMMUNITY): Payer: Self-pay

## 2017-07-18 ENCOUNTER — Encounter (HOSPITAL_COMMUNITY): Payer: Medicare Other

## 2017-07-18 DIAGNOSIS — E785 Hyperlipidemia, unspecified: Secondary | ICD-10-CM

## 2017-07-18 DIAGNOSIS — E538 Deficiency of other specified B group vitamins: Secondary | ICD-10-CM

## 2017-07-18 LAB — BASIC METABOLIC PANEL
Anion gap: 11 (ref 5–15)
BUN: 19 mg/dL (ref 6–20)
CHLORIDE: 106 mmol/L (ref 101–111)
CO2: 25 mmol/L (ref 22–32)
Calcium: 8.9 mg/dL (ref 8.9–10.3)
Creatinine, Ser: 0.94 mg/dL (ref 0.44–1.00)
GFR calc Af Amer: 60 mL/min — ABNORMAL LOW (ref >60)
GFR calc non Af Amer: 51 mL/min — ABNORMAL LOW (ref >60)
GLUCOSE: 117 mg/dL — AB (ref 65–99)
POTASSIUM: 3.5 mmol/L (ref 3.5–5.1)
Sodium: 142 mmol/L (ref 135–145)

## 2017-07-18 LAB — CBC
HEMATOCRIT: 37.2 % (ref 36.0–46.0)
HEMOGLOBIN: 11.8 g/dL — AB (ref 12.0–15.0)
MCH: 27.9 pg (ref 26.0–34.0)
MCHC: 31.7 g/dL (ref 30.0–36.0)
MCV: 87.9 fL (ref 78.0–100.0)
Platelets: 273 10*3/uL (ref 150–400)
RBC: 4.23 MIL/uL (ref 3.87–5.11)
RDW: 13.1 % (ref 11.5–15.5)
WBC: 9.6 10*3/uL (ref 4.0–10.5)

## 2017-07-18 LAB — URINE CULTURE

## 2017-07-18 LAB — TSH: TSH: 2.489 u[IU]/mL (ref 0.350–4.500)

## 2017-07-18 LAB — VITAMIN B12: Vitamin B-12: 105 pg/mL — ABNORMAL LOW (ref 180–914)

## 2017-07-18 LAB — LIPID PANEL
CHOL/HDL RATIO: 2.6 ratio
Cholesterol: 185 mg/dL (ref 0–200)
HDL: 72 mg/dL (ref >40)
LDL CALC: 85 mg/dL (ref 0–99)
TRIGLYCERIDES: 139 mg/dL (ref <150)
VLDL: 28 mg/dL (ref 0–40)

## 2017-07-18 MED ORDER — AMLODIPINE BESYLATE 10 MG PO TABS
10.0000 mg | ORAL_TABLET | Freq: Every day | ORAL | Status: DC
  Administered 2017-07-18 – 2017-07-20 (×3): 10 mg via ORAL
  Filled 2017-07-18 (×3): qty 1

## 2017-07-18 MED ORDER — LABETALOL HCL 5 MG/ML IV SOLN
10.0000 mg | INTRAVENOUS | Status: DC | PRN
  Administered 2017-07-18: 10 mg via INTRAVENOUS
  Administered 2017-07-18 – 2017-07-19 (×2): 20 mg via INTRAVENOUS
  Administered 2017-07-20: 10 mg via INTRAVENOUS
  Filled 2017-07-18 (×4): qty 4

## 2017-07-18 MED ORDER — CYANOCOBALAMIN 1000 MCG/ML IJ SOLN
1000.0000 ug | Freq: Once | INTRAMUSCULAR | Status: AC
  Administered 2017-07-18: 1000 ug via INTRAMUSCULAR
  Filled 2017-07-18: qty 1

## 2017-07-18 MED ORDER — PRAVASTATIN SODIUM 20 MG PO TABS
20.0000 mg | ORAL_TABLET | Freq: Every day | ORAL | Status: DC
  Administered 2017-07-18 – 2017-07-19 (×2): 20 mg via ORAL
  Filled 2017-07-18 (×2): qty 1

## 2017-07-18 MED ORDER — TRAZODONE HCL 50 MG PO TABS
50.0000 mg | ORAL_TABLET | Freq: Once | ORAL | Status: AC
  Administered 2017-07-18: 50 mg via ORAL
  Filled 2017-07-18: qty 1

## 2017-07-18 MED ORDER — VITAMIN B-12 1000 MCG PO TABS
1000.0000 ug | ORAL_TABLET | Freq: Every day | ORAL | Status: DC
  Administered 2017-07-19 – 2017-07-20 (×2): 1000 ug via ORAL
  Filled 2017-07-18 (×2): qty 1

## 2017-07-18 NOTE — Progress Notes (Signed)
Occupational Therapy Treatment Patient Details Name: [name] MRN: [id number] DOB: 12-Nov-1923 Today's Date: 07/18/2017    History of present illness 84 yo admitted not feeling well with trace SAH right frontal. PMhx: HTN, HLD, afib, GERD   OT comments  Pt is able to perform ADLs with min guard assist and min verbal cues. She performs fairly well when in a structured, minimally distracting, and controlled environment, however, when she is fatigued, distracted, or in a novel environment, she requires increased  cues/assistance.  Overall,  She continues to demonstrate deficits with safety awareness, impaired balance, Lt inattention (apparent when she fatigues and distracted), decreased problem solving in novel situations, as well as impaired visual scanning and difficulty encoding visual information in novel and dynamic environments.   She attempted several times to don slippers unsuccessfully, and was unable to problem solve through appropriated modifications without therapist's intervention.    She was better able to negotiate while ambulating in hallway today - she did not run into times on either the Lt or Rt today, however, she attempted to walk into another patient's room without awareness that she was leaving the hallway.    She was aslo unable to locate her room, nor was she aware that she was even close to her room when she got back tthere.    She ran squarely into the left side of the doorframe while going into the bathroom.  She continues with balance deficits and demonstrates decreased safety awareness by walking away from walker to locate desired items.    Due to above observed deficits, she is at risk for falls and injury.  Strongly recommend 24 hour supervision initially upon discharge.  I did discuss this with her, and discussed her risk for falls.  She was receptive, but struggled with making a plan - encouraged her to discuss with her son.  She may perform well in her home, which is  familiar, but if her environment is cluttered, she will be at increased risk of falls.   Recommend that she not attempt to cook, and that she does not drive (she reports she occasionally drives to [employer]).  IF 24 hour supervision is not available, recommend SNF for short term rehab to allow her to further improve, and provide her increased opportunity to build awareness of deficits.     Follow Up Recommendations  Supervision/Assistance - 24 hour;SNF (if supervision not available)  - see comments above.  IF pt discharges home, recommend [facility].     Equipment Recommendations  3 in 1 bedside commode    Recommendations for Other Services      Precautions / Restrictions Precautions Precautions: Fall       Mobility Bed Mobility               General bed mobility comments: pt in chair on arrival  Transfers Overall transfer level: Needs assistance   Transfers: Sit to/from Stand;Stand Pivot Transfers Sit to Stand: Min guard Stand pivot transfers: Min guard       General transfer comment: guarding for safety and lines. With return to chair pt sitting impulsively, flipping rW over with left hand with no awareness it was even there    Balance Overall balance assessment: Needs assistance Sitting-balance support: Feet supported Sitting balance-Leahy Scale: Good     Standing balance support: No upper extremity supported;During functional activity Standing balance-Leahy Scale: Fair  ADL either performed or assessed with clinical judgement   ADL Overall ADL's : Needs assistance/impaired     Grooming: Wash/dry hands;Wash/dry face;Oral care;Min guard;Standing Grooming Details (indicate cue type and reason): min guard assist due to mild unsteadiness              Lower Body Dressing: Min guard;Sit to/from stand Lower Body Dressing Details (indicate cue type and reason): Pt attempted to don her slipper x 4 and was unable to complete the  task.  She required therapist's intervention to cue her how to modify the environment for success  Toilet Transfer: Min guard;Ambulation;Comfort height toilet;RW Armed forces technical officer Details (indicate cue type and reason): as pt was fatigued and distracted, she ran squarely into doorframe on the Lt side.  She stated "I thought that doorway was large enough for me to move through it"  Toileting- Water quality scientist and Hygiene: Min guard;Sit to/from stand Toileting - Clothing Manipulation Details (indicate cue type and reason): Pt clothing smells of urine      Functional mobility during ADLs: Min guard;Rolling walker       Vision   Additional Comments: while in static environment, pt locating items on Lt without difficulty.  she was better able to negotiate a dynamic environment while navigating around obstacles on Rt and Lt.  However, as she fatigued, she ran squarely into left side of doorframe while going into the bathroom    Perception     Praxis      Cognition Arousal/Alertness: Awake/alert Behavior During Therapy: WFL for tasks assessed/performed Overall Cognitive Status: Impaired/Different from baseline                     Current Attention Level: Selective   Following Commands: Follows multi-step commands inconsistently Safety/Judgement: Decreased awareness of deficits;Decreased awareness of safety Awareness: Intellectual Problem Solving: Requires verbal cues General Comments: Pt demonstrated difficulty donning her slipper, she had items wrapped on her foot, She made repeated errors, and unable to make a correction/change without therapist's intervention.   While ambulating on unit, pt unable to locate her room.  She attempted to walk into another pt's room with no awareness that she was transitioning from the hallway into a room.   while in bathroom, pt walked away from bathroom, wiithout walker to grab a cup and had mild loss of balance - poor awareness re: need for  walker.        Exercises     Shoulder Instructions       General Comments Discussed need for 24 hour assist initially.  She is receptive to this feedback and engaged on possible solutions - she reports she will discuss with her son     Pertinent Vitals/ Pain       Pain Assessment: Faces Faces Pain Scale: Hurts little more Pain Location: Rt knee pain   Home Living     Available Help at Discharge: Family;Friend(s);Available PRN/intermittently Type of Home: House                              Lives With: Alone    Prior Functioning/Environment              Frequency  Min 2X/week        Progress Toward Goals  OT Goals(current goals can now be found in the care plan section)  Progress towards OT goals: Progressing toward goals     Plan Discharge plan remains appropriate  Co-evaluation                 AM-PAC PT "6 Clicks" Daily Activity     Outcome Measure   Help from another person eating meals?: None Help from another person taking care of personal grooming?: A Little Help from another person toileting, which includes using toliet, bedpan, or urinal?: A Little Help from another person bathing (including washing, rinsing, drying)?: A Little Help from another person to put on and taking off regular upper body clothing?: A Little Help from another person to put on and taking off regular lower body clothing?: A Little 6 Click Score: 19    End of Session Equipment Utilized During Treatment: Rolling walker  OT Visit Diagnosis: Unsteadiness on feet (R26.81);Cognitive communication deficit (R41.841) Symptoms and signs involving cognitive functions: Nontraumatic SAH   Activity Tolerance Patient tolerated treatment well   Patient Left in chair;with call bell/phone within reach;with chair alarm set   Nurse Communication Mobility status        Time: 1332-1400 OT Time Calculation (min): 28 min  Charges: OT General Charges $OT Visit: 1  Visit OT Treatments $Self Care/Home Management : 8-22 mins $Therapeutic Activity: 8-22 mins  [facility], OTR/L [phone]    Lucille Passy M 07/18/2017, 2:27 PM

## 2017-07-18 NOTE — Progress Notes (Signed)
STROKE TEAM PROGRESS NOTE   SUBJECTIVE (INTERVAL HISTORY) No family is at the bedside.  Overall she feels her condition is stable. Her BP still on the high end but off cardene. Added amlodipine for BP control. She is eager to go home. Repeat CT head showed decreased density of right frontal trace SAH.    OBJECTIVE Temp:  [97.9 F (36.6 C)-98.5 F (36.9 C)] 97.9 F (36.6 C) (02/26 0800) Pulse Rate:  [58-96] 62 (02/26 0700) Cardiac Rhythm: Heart block (02/26 0400) Resp:  [13-23] 16 (02/26 0700) BP: (113-191)/(46-127) 157/60 (02/26 0700) SpO2:  [90 %-100 %] 91 % (02/26 0700)  No results for input(s): GLUCAP in the last 168 hours. Recent Labs  Lab 07/16/17 1624 07/18/17 0357  NA 138 142  K 3.4* 3.5  CL 102 106  CO2 25 25  GLUCOSE 96 117*  BUN 26* 19  CREATININE 0.87 0.94  CALCIUM 9.3 8.9   No results for input(s): AST, ALT, ALKPHOS, BILITOT, PROT, ALBUMIN in the last 168 hours. Recent Labs  Lab 07/16/17 1624 07/16/17 2257 07/18/17 0357  WBC 7.8 9.6 9.6  NEUTROABS 4.8  --   --   HGB 12.6 12.8 11.8*  HCT 39.1 38.5 37.2  MCV 88.1 86.5 87.9  PLT 278 256 273   Recent Labs  Lab 07/16/17 1624  TROPONINI <0.03   Recent Labs    07/16/17 2257  LABPROT 24.9*  INR 2.28   Recent Labs    07/16/17 1603  COLORURINE YELLOW  LABSPEC 1.012  PHURINE 5.0  GLUCOSEU NEGATIVE  HGBUR NEGATIVE  BILIRUBINUR NEGATIVE  KETONESUR NEGATIVE  PROTEINUR NEGATIVE  NITRITE NEGATIVE  LEUKOCYTESUR SMALL*       Component Value Date/Time   CHOL 185 07/18/2017 0357   TRIG 139 07/18/2017 0357   HDL 72 07/18/2017 0357   CHOLHDL 2.6 07/18/2017 0357   VLDL 28 07/18/2017 0357   LDLCALC 85 07/18/2017 0357   Lab Results  Component Value Date   HGBA1C 5.1 09/22/2015      Component Value Date/Time   LABOPIA NONE DETECTED 07/17/2017 0748   COCAINSCRNUR NONE DETECTED 07/17/2017 0748   LABBENZ NONE DETECTED 07/17/2017 0748   AMPHETMU NONE DETECTED 07/17/2017 0748   THCU NONE DETECTED  07/17/2017 0748   LABBARB NONE DETECTED 07/17/2017 0748    No results for input(s): ETH in the last 168 hours.  I have personally reviewed the radiological images below and agree with the radiology interpretations.  Ct Angio Head W/cm &/or Wo Cm  Result Date: 07/16/2017 CLINICAL DATA:  Right parietal subarachnoid hemorrhage. EXAM: CT ANGIOGRAPHY HEAD TECHNIQUE: Multidetector CT imaging of the head was performed using the standard protocol during bolus administration of intravenous contrast. Multiplanar CT image reconstructions and MIPs were obtained to evaluate the vascular anatomy. CONTRAST:  <See Chart> ISOVUE-370 IOPAMIDOL (ISOVUE-370) INJECTION 76% COMPARISON:  CT head without contrast from the same day. MRI brain and MRA [facility] 09/08/2008 FINDINGS: CTA HEAD Anterior circulation: The internal carotid arteries are within normal limits from the high cervical segments through the ICA termini bilaterally. The right A1 segment is aplastic. Both A2 segments fill from the left with a patent anterior communicating artery. The left A1 segment is normal. M1 segments are within normal limits bilaterally. The MCA bifurcations are unremarkable. ACA and MCA branch vessels are within normal limits. Posterior circulation: The right vertebral artery is slightly dominant to the left. There is a fenestration at the vertebrobasilar junction without aneurysm. Basilar artery is otherwise normal. Right posterior cerebral  artery is of fetal type with a small right P1 segment. The left posterior cerebral artery originates from the basilar tip. Moderate narrowing is present in the proximal left P2 segment. Distal PCA branch vessels are intact bilaterally. Venous sinuses: The dural sinuses are patent bilaterally. The left transverse sinus is dominant straight sinus and deep cerebral veins are intact. Cortical veins are unremarkable. Anatomic variants: Fetal type right posterior cerebral artery. Delayed phase: Delayed  images demonstrate no pathologic enhancement. Focal hyperdensity in the right parietal lobe is stable. IMPRESSION: 1. No focal vascular lesion to explain subarachnoid hemorrhage. 2. Focal hyperdensity in the right parietal lobe, suspected subarachnoid hemorrhage, is stable. 3. Moderate narrowing of the proximal left P2 segment. 4. No other significant proximal stenosis, aneurysm, or branch vessel occlusion. Electronically Signed   By: San Morelle M.D.   On: 07/16/2017 19:01   Dg Chest 2 View  Result Date: 07/16/2017 CLINICAL DATA:  Hypertension.  Headache.  Weakness. EXAM: CHEST  2 VIEW COMPARISON:  12/07/2016 FINDINGS: Borderline enlargement of the cardiopericardial silhouette. Tortuous aorta with atherosclerotic calcification of the aortic arch and descending thoracic aorta. There is bandlike opacity at the left lung base which appears mildly increased from the 12/07/2016, and probably represents combination of atelectasis and scarring. The lungs appear otherwise clear. No pleural effusion. Mild thoracic spondylosis. IMPRESSION: 1. Bandlike opacity at the left lung base is increased compared to July 2018. Given the configuration, a combination of scarring and atelectasis is favored. 2.  Aortic Atherosclerosis (ICD10-I70.0). Electronically Signed   By: Van Clines M.D.   On: 07/16/2017 17:30   Ct Head Wo Contrast  Result Date: 07/18/2017 CLINICAL DATA:  84 y/o F; right frontal subarachnoid hemorrhage for follow-up. EXAM: CT HEAD WITHOUT CONTRAST TECHNIQUE: Contiguous axial images were obtained from the base of the skull through the vertex without intravenous contrast. COMPARISON:  07/17/2017 MRI of the head. FINDINGS: Brain: Decreased density within right parietal sulcus (series 3, image 23) likely representing dispersion of subarachnoid hemorrhage. No new acute intracranial hemorrhage, stroke, or focal mass effect identified. Stable chronic microvascular ischemic changes and parenchymal  volume loss of the brain. Vascular: No hyperdense vessel or unexpected calcification. Skull: Normal. Negative for fracture or focal lesion. Sinuses/Orbits: Stable small fluid level within the right sphenoid sinus. Otherwise negative. Bilateral intra-ocular lens replacement. Other: None. IMPRESSION: 1. Decreased density within right parietal sulcus, likely interval partial dispersion of subarachnoid hemorrhage. 2. No new acute intracranial abnormality identified. 3. Stable chronic microvascular ischemic changes and parenchymal volume loss of the brain. Electronically Signed   By: Kristine Garbe M.D.   On: 07/18/2017 05:46   Ct Head Wo Contrast  Result Date: 07/16/2017 CLINICAL DATA:  Hypertension.  Headache.  Weakness. EXAM: CT HEAD WITHOUT CONTRAST TECHNIQUE: Contiguous axial images were obtained from the base of the skull through the vertex without intravenous contrast. COMPARISON:  02/12/2013 FINDINGS: Brain: Periventricular white matter and corona radiata hypodensities favor chronic ischemic microvascular white matter disease. On image 22/2 and on images 18-22 of series 5 there subtle density along a sulcal margin which was not apparent on 02/12/2013. Otherwise, The brainstem, cerebellum, cerebral peduncles, thalami, basal ganglia, basilar cisterns, and ventricular system appear within normal limits. No mass lesion or acute CVA identified. Vascular: Unremarkable Skull: Unremarkable Sinuses/Orbits: Unremarkable Other: No supplemental non-categorized findings. IMPRESSION: 1. Subtle hyperdensity along a sulcal margin in the right parietal lobe on image 22/2 is suspicious for a trace amount of subarachnoid hemorrhage. A venous angioma can sometimes give a  similar appearance but this density was not present on 02/12/2013, and a small amount of subarachnoid hemorrhage is favored. 2. Periventricular white matter and corona radiata hypodensities favor chronic ischemic microvascular white matter disease.  Critical Value/emergent results were called by telephone at the time of interpretation on 07/16/2017 at 5:28 pm to Dr. Francine Graven , who verbally acknowledged these results. Electronically Signed   By: Van Clines M.D.   On: 07/16/2017 17:28   Mr Brain Wo Contrast  Result Date: 07/17/2017 CLINICAL DATA:  Possible head injury this week. Follow-up suspected subarachnoid hemorrhage. EXAM: MRI HEAD WITHOUT CONTRAST TECHNIQUE: Multiplanar, multiecho pulse sequences of the brain and surrounding structures were obtained without intravenous contrast. COMPARISON:  CT HEAD July 16, 2017 FINDINGS: INTRACRANIAL CONTENTS: No reduced diffusion to suggest acute ischemia. No susceptibility artifact with particular attention to subarachnoid space. Moderate to severe parenchymal brain volume loss. No hydrocephalus. Patchy supratentorial white matter FLAIR T2 hyperintensities. No midline shift, mass effect or masses. VASCULAR: Normal major intracranial vascular flow voids present at skull base. SKULL AND UPPER CERVICAL SPINE: No abnormal sellar expansion. No suspicious calvarial bone marrow signal. Craniocervical junction maintained. SINUSES/ORBITS: Trace sphenoid sinus mucosal thickening. Mastoid air cells are well aerated. Included ocular globes and orbital contents are non-suspicious. Status post bilateral ocular lens implants. OTHER: None. IMPRESSION: 1. No acute intracranial process. No MR findings of subarachnoid hemorrhage though, CT is more sensitive in the acute setting. 2. Moderate to severe parenchymal brain volume loss. Mild chronic small vessel ischemic disease. Electronically Signed   By: Elon Alas M.D.   On: 07/17/2017 05:31    Carotid Doppler  pending  TTE - Left ventricle: The cavity size was mildly dilated. Wall   thickness was normal. Systolic function was normal. The estimated   ejection fraction was in the range of 55% to 60%. - Mitral valve: There was mild regurgitation. -  Left atrium: The atrium was mildly dilated.   PHYSICAL EXAM  Temp:  [97.9 F (36.6 C)-98.5 F (36.9 C)] 97.9 F (36.6 C) (02/26 0800) Pulse Rate:  [58-96] 62 (02/26 0700) Resp:  [13-23] 16 (02/26 0700) BP: (113-191)/(46-127) 157/60 (02/26 0700) SpO2:  [90 %-100 %] 91 % (02/26 0700)  General - Well nourished, well developed, in no apparent distress.  Ophthalmologic - fundi not visualized due to noncooperation.  Cardiovascular - Regular rate and rhythm with no murmur.  Mental Status -  Level of arousal and orientation to time, place, and person were intact. Language including expression, naming, repetition, comprehension was assessed and found intact. Fund of Knowledge was assessed and was intact.  Cranial Nerves II - XII - II - Visual field intact OU. III, IV, VI - Extraocular movements intact. V - Facial sensation intact bilaterally. VII - Facial movement intact bilaterally. VIII - Hearing & vestibular intact bilaterally. X - Palate elevates symmetrically. XI - Chin turning & shoulder shrug intact bilaterally. XII - Tongue protrusion intact.  Motor Strength - The patient's strength was normal in all extremities except right knee pain on flexion and pronator drift was absent.  Bulk was normal and fasciculations were absent.   Motor Tone - Muscle tone was assessed at the neck and appendages and was normal.  Reflexes - The patient's reflexes were symmetrical in all extremities and she had no pathological reflexes.  Sensory - Light touch, temperature/pinprick were assessed and were symmetrical.    Coordination - The patient had normal movements in the hands with no ataxia or dysmetria.  Tremor was  absent.  Gait and Station - deferred.   ASSESSMENT/PLAN [name] is a 84 y.o. female with history of afib on Xarelto, HTN, HLD admitted for HA for 2-3 days, not feeling well. No tPA given due to [facility] and on Xarelto    ? Trace SAH right frontal - suspicious on CT but  not demonstrated on MRI - likely due to HTN not in good control at home  Resultant HA  CT head concerning for trace SAH right frontal  MRI  No infarct, no SAH  CTA head no aneurysm, left P2 narrowing  Repeat CT decreased hyperdensity at right frontal trace SAH  Carotid Doppler  pending  2D Echo EF 55-60%  LDL 116  HgbA1c pending  UDS neg  SCDs for VTE prophylaxis Fall precautions  Diet regular Room service appropriate? Yes; Fluid consistency: Thin   Xarelto (rivaroxaban) daily prior to admission, now on No antithrombotic due to concerns of SAH  Ongoing aggressive stroke risk factor management  Therapy recommendations:  pending  Disposition:  pending  PAF  On Xarelto at home, currently on hold  Currently in SR  Continue hold off Xarelto   Repeat CT showed decreased hyperdensity at right frontal trace SAH  Need to follow up in clinic in one week and repeat CT. If SAH resolves, resume Xarelto  Hypertension Stable BP goal < 160 On home cardizem and metoprolol Off cardene Taper off cardene as able Increase metoprolol to 25mg  bid Add amlodipine 10mg  today  Long term BP goal normotensive  Hyperlipidemia  Home meds:  none   LDL 116, goal < 70  Add pravastatin 20mg   Continue statin on discharge.  B12 deficiency  B12 105  B12 102mcg IM x 1  B12 1015mcg po daily  Other Stroke Risk Factors  Advanced age  Other Active Problems  hypothyroidism - on synthroid - TSH WNL  Hypokalemia - supplement  Hospital day # 2  This patient is critically ill due to concerns of SAH, hypertensive emergency, PAF and at significant risk of neurological worsening, death form recurrent SAH, recurrent stroke, heart failure, cerebral edema. This patient's care requires constant monitoring of vital signs, hemodynamics, respiratory and cardiac monitoring, review of multiple databases, neurological assessment, discussion with family, other specialists and medical  decision making of high complexity. I spent 35 minutes of neurocritical care time in the care of this patient.  Rosalin Hawking, MD PhD Stroke Neurology 07/18/2017 9:59 AM    To contact Stroke Continuity provider, please refer to [email or web address] After hours, contact General Neurology

## 2017-07-18 NOTE — Evaluation (Signed)
Speech Language Pathology Evaluation Patient Details Name: [name] MRN: [id number] DOB: 1924/05/08 Today's Date: 07/18/2017 Time: 2952-8413 SLP Time Calculation (min) (ACUTE ONLY): 26 min  Problem List:  Patient Active Problem List   Diagnosis Date Noted  . HLD (hyperlipidemia) 07/18/2017  . B12 deficiency 07/18/2017  . SAH (subarachnoid hemorrhage) ([place]) 07/17/2017  . Hypertensive urgency 07/16/2017  . ICH (intracerebral hemorrhage) ([place]) 07/16/2017  . Cervical disc disease 05/09/2017  . Hypothyroid 05/09/2017  . Major depression in partial remission ([place]) 03/17/2016  . Atrial fibrillation ([place]) 01/12/2016  . Osteoarthritis of right knee 12/15/2015  . Major depression 09/22/2015  . Ventral hernia 05/21/2015  . Ovarian tumor 04/13/2015  . Squamous cell skin cancer 08/13/2014  . Impaired fasting glucose 08/05/2013  . Neuropathy 01/11/2013  . Long term (current) use of anticoagulants 07/27/2012  . Atrial fibrillation with RVR ([place]) 01/29/2012  . Anxiety 01/29/2012  . GERD (gastroesophageal reflux disease) 01/29/2012  . Chronic anticoagulation 01/29/2012  . Sciatica of right side 05/10/2011   Past Medical History:  Past Medical History:  Diagnosis Date  . Abdominal pain, chronic, right lower quadrant    "ever since I had my appendix removed"  . Atrial fibrillation ([place])   . Cancer ([place])    left side  . Chronic back pain   . Edema   . GERD (gastroesophageal reflux disease)   . Hyperlipidemia   . Hypertension   . Osteopenia   . Osteoporosis   . Ovarian cyst 02/2015   left  . Recurrent abdominal pain    LLQ   Past Surgical History:  Past Surgical History:  Procedure Laterality Date  . APPENDECTOMY    . APPENDECTOMY    . BREAST SURGERY    . CARDIOVERSION N/A 03/31/2016   Procedure: CARDIOVERSION;  Surgeon: Josue Hector, MD;  Location: [facility];  Service: Cardiovascular;  Laterality: N/A;  . COLON RESECTION    . COLON SURGERY    . HERNIA REPAIR    . HERNIA  REPAIR     HPI:  84 yo admitted not feeling well with trace SAH right frontal. PMhx: HTN, HLD, afib, GERD   Assessment / Plan / Recommendation Clinical Impression   Pt presents with mild cognitive impairment characterized by decreased anticipatory awareness of how her current limitations will impact her functional independence in the home environment.  No obvious visual scanning or attention deficits were noted on evaluation although they were not formally assessed.  Pt even directed therapist to leave objects that she used frequently (ie extra blanket, sweater from home) within her reach in a chair on her left as therapist was departing.  Pt had very clear recollection of yesterday's PT/OT evaluations and could provide a reasonable explanation as to why she behaved in the manner described, reporting "I don't need someone talking to me as if I am a child, I can walk."  RN endorses that pt has been very stable when transferring to and from bed and when  ambulating around the unit.  SLP discussed rationale of all therapy disciplines in maximizing safety and independence in the home environment. SLP also had a long discussion with pt regarding therapy recommendations.  Pt was receptive to recommendations and open to therapist's suggestions for improving safety at home including using a pill box for medication management.  Pt could also clearly convey her concerns regarding 24/7 supervision, specifically as it pertains to her perceived loss of independence.  Based on assessment, therapist gets the impression that cognitively pt  is at or near her baseline and that her behavior yesterday was incongruous to her norm.  Based on today's evaluation, would recommend that pt have at least intermittent assistance at discharge.  Will follow up for 1 additional therapy session while inpatient for further diagnostic treatment to assist in discharge planning.      SLP Assessment  SLP Recommendation/Assessment: Patient  needs continued Speech Lanaguage Pathology Services SLP Visit Diagnosis: Frontal lobe and executive function deficit    Follow Up Recommendations  Other (comment)(To be determined)    Frequency and Duration min 1 x/week         SLP Evaluation Cognition  Orientation Level: Oriented X4       Comprehension  Auditory Comprehension Overall Auditory Comprehension: Appears within functional limits for tasks assessed    Expression Expression Primary Mode of Expression: Verbal Verbal Expression Overall Verbal Expression: Appears within functional limits for tasks assessed   Oral / Motor  Oral Motor/Sensory Function Overall Oral Motor/Sensory Function: Within functional limits Motor Speech Overall Motor Speech: Appears within functional limits for tasks assessed   GO                    Emilio Math 07/18/2017, 11:31 AM

## 2017-07-19 ENCOUNTER — Inpatient Hospital Stay (HOSPITAL_COMMUNITY): Payer: Medicare Other

## 2017-07-19 DIAGNOSIS — I609 Nontraumatic subarachnoid hemorrhage, unspecified: Secondary | ICD-10-CM

## 2017-07-19 LAB — BASIC METABOLIC PANEL
ANION GAP: 9 (ref 5–15)
BUN: 16 mg/dL (ref 6–20)
CALCIUM: 9 mg/dL (ref 8.9–10.3)
CHLORIDE: 108 mmol/L (ref 101–111)
CO2: 25 mmol/L (ref 22–32)
Creatinine, Ser: 0.83 mg/dL (ref 0.44–1.00)
GFR calc non Af Amer: 60 mL/min — ABNORMAL LOW (ref >60)
Glucose, Bld: 115 mg/dL — ABNORMAL HIGH (ref 65–99)
Potassium: 3.7 mmol/L (ref 3.5–5.1)
SODIUM: 142 mmol/L (ref 135–145)

## 2017-07-19 LAB — CBC
HEMATOCRIT: 37.2 % (ref 36.0–46.0)
HEMOGLOBIN: 12.4 g/dL (ref 12.0–15.0)
MCH: 29.5 pg (ref 26.0–34.0)
MCHC: 33.3 g/dL (ref 30.0–36.0)
MCV: 88.6 fL (ref 78.0–100.0)
Platelets: 243 10*3/uL (ref 150–400)
RBC: 4.2 MIL/uL (ref 3.87–5.11)
RDW: 13.1 % (ref 11.5–15.5)
WBC: 9.3 10*3/uL (ref 4.0–10.5)

## 2017-07-19 LAB — HEMOGLOBIN A1C
HEMOGLOBIN A1C: 6 % — AB (ref 4.8–5.6)
MEAN PLASMA GLUCOSE: 126 mg/dL

## 2017-07-19 MED ORDER — LABETALOL HCL 100 MG PO TABS: 50.0000 mg | ORAL_TABLET | Freq: Two times a day (BID) | ORAL | Status: DC

## 2017-07-19 MED ORDER — HYDRALAZINE HCL 25 MG PO TABS
25.0000 mg | ORAL_TABLET | Freq: Three times a day (TID) | ORAL | Status: DC
  Administered 2017-07-19 – 2017-07-20 (×3): 25 mg via ORAL
  Filled 2017-07-19 (×3): qty 1

## 2017-07-19 MED ORDER — METOPROLOL TARTRATE 50 MG PO TABS
50.0000 mg | ORAL_TABLET | Freq: Two times a day (BID) | ORAL | Status: DC
  Administered 2017-07-19 – 2017-07-20 (×2): 50 mg via ORAL
  Filled 2017-07-19 (×2): qty 1

## 2017-07-19 MED ORDER — TRAZODONE HCL 50 MG PO TABS
50.0000 mg | ORAL_TABLET | Freq: Every evening | ORAL | Status: DC | PRN
  Administered 2017-07-19: 50 mg via ORAL
  Filled 2017-07-19: qty 1

## 2017-07-19 MED ORDER — METOPROLOL TARTRATE 25 MG PO TABS
25.0000 mg | ORAL_TABLET | Freq: Two times a day (BID) | ORAL | Status: DC
  Administered 2017-07-19: 25 mg via ORAL
  Filled 2017-07-19: qty 1

## 2017-07-19 NOTE — Progress Notes (Signed)
Physical Therapy Treatment Patient Details Name: [name] MRN: [id number] DOB: 04-14-1924 Today's Date: 07/19/2017    History of Present Illness 84 yo admitted not feeling well with trace SAH right frontal. PMhx: HTN, HLD, afib, GERD    PT Comments    Patient received in bed, pleasant and willing to participate with PT this morning. She requires only min guard for bed mobility and functional transfers, however continues to demonstrate very poor safety awareness with gait with RW, drifting to left and with unsafe use of device. When assisted and corrected by PT, even in gentle manner, patient quickly becomes agitated and belligerent towards PT, requiring family assistance to calm down. She was left up in the chair with all needs met, alarm set, family present this morning. Continue to recommend SNF.    Follow Up Recommendations  SNF;Supervision/Assistance - 24 hour     Equipment Recommendations  Rolling walker with 5" wheels    Recommendations for Other Services       Precautions / Restrictions Precautions Precautions: Fall Precaution Comments: easily agitated with corrections Restrictions Weight Bearing Restrictions: No    Mobility  Bed Mobility Overal bed mobility: Needs Assistance Bed Mobility: Supine to Sit     Supine to sit: Min guard     General bed mobility comments: increased time and effort   Transfers Overall transfer level: Needs assistance Equipment used: Rolling walker (2 wheeled) Transfers: Sit to/from Stand Sit to Stand: Min guard         General transfer comment: guarding for safety, continues to be impulsive and with poor safety awareness   Ambulation/Gait Ambulation/Gait assistance: Min assist Ambulation Distance (Feet): 100 Feet Assistive device: Rolling walker (2 wheeled) Gait Pattern/deviations: Step-through pattern;Decreased stride length     General Gait Details: patient declining further gait today due to concern of agitating the  pain she has been having in her R flank/back/neck; very poor safety awareness with RW, drifts to L and holds walker far out to her side so she is not within bouncardies of walker. MinA and cues to correct and maintain safety but patient very quickly and easily becoming agitated and belligerant with therapist when cues and assistance provided, yelling at therapist in hallway and required family assistance to calm patient down. Remains impulsive during gait.    Stairs            Wheelchair Mobility    Modified Rankin (Stroke Patients Only)       Balance Overall balance assessment: Needs assistance Sitting-balance support: Feet supported Sitting balance-Leahy Scale: Good     Standing balance support: No upper extremity supported;During functional activity Standing balance-Leahy Scale: Fair                              Cognition Arousal/Alertness: Awake/alert Behavior During Therapy: WFL for tasks assessed/performed Overall Cognitive Status: No family/caregiver present to determine baseline cognitive functioning Area of Impairment: Orientation;Safety/judgement;Following commands;Problem solving;Awareness                 Orientation Level: Time Current Attention Level: Selective   Following Commands: Follows multi-step commands inconsistently Safety/Judgement: Decreased awareness of deficits;Decreased awareness of safety Awareness: Intellectual Problem Solving: Requires verbal cues General Comments: patient with very poor safety awareness, quickly becomes belligerant and agitated with therapist when cues made for safety and explanation given as to why cues were provided; cues to find room in hallway       Exercises  General Comments        Pertinent Vitals/Pain Pain Assessment: 0-10 Pain Score: 4  Pain Location: R flank and back pain, neck pain  Pain Descriptors / Indicators: Aching;Sore;Tightness Pain Intervention(s): Limited activity within  patient's tolerance;Monitored during session;Repositioned    Home Living                      Prior Function            PT Goals (current goals can now be found in the care plan section) Acute Rehab PT Goals Patient Stated Goal: return home PT Goal Formulation: With patient Time For Goal Achievement: 07/31/17 Potential to Achieve Goals: Fair Progress towards PT goals: Progressing toward goals    Frequency    Min 3X/week      PT Plan Current plan remains appropriate    Co-evaluation              AM-PAC PT "6 Clicks" Daily Activity  Outcome Measure  Difficulty turning over in bed (including adjusting bedclothes, sheets and blankets)?: None Difficulty moving from lying on back to sitting on the side of the bed? : None Difficulty sitting down on and standing up from a chair with arms (e.g., wheelchair, bedside commode, etc,.)?: None Help needed moving to and from a bed to chair (including a wheelchair)?: A Little Help needed walking in hospital room?: A Little Help needed climbing 3-5 steps with a railing? : A Lot 6 Click Score: 20    End of Session Equipment Utilized During Treatment: Gait belt Activity Tolerance: Treatment limited secondary to agitation Patient left: in chair;with call bell/phone within reach;with family/visitor present;with chair alarm set   PT Visit Diagnosis: Other abnormalities of gait and mobility (R26.89);Unsteadiness on feet (R26.81);Other symptoms and signs involving the nervous system (R29.898)     Time: 2831-5176 PT Time Calculation (min) (ACUTE ONLY): 14 min  Charges:  $Gait Training: 8-22 mins                    G Codes:       Deniece Ree PT, DPT, CBIS  Supplemental Physical Therapist [place]   Pager [phone]

## 2017-07-19 NOTE — Progress Notes (Signed)
OT Cancellation Note  Patient Details Name: APREL EGELHOFF MRN: [id number] DOB: 04/28/1924   Cancelled Treatment:    Reason Eval/Treat Not Completed: Patient at procedure or test/ unavailable.   Attempted x 2 - pt at procedure x 1, then with another provider.  Will reattempt.  Yulian Gosney [place], OTR/L [phone]   Lucille Passy M 07/19/2017, 4:00 PM

## 2017-07-19 NOTE — Progress Notes (Signed)
Preliminary notes by tech- Carotid duplex exam completed.  Bilateral vertebral arteries antegrade flow. 1-39% stenosis bilateral carotid arteries.  [name] (RDMS, RVT)

## 2017-07-19 NOTE — Progress Notes (Addendum)
STROKE TEAM PROGRESS NOTE   SUBJECTIVE (INTERVAL HISTORY) Granddaughter is at the bedside.  Overall she feels her condition is improving slowly. Her BP trending down slowly. She continues to be eager to go home, encouraged to consider SNF for short term rehab. Working with MSW to find placement. Complaint of Right flank pain and H/A overnight. Resolved on my exam. Ambulated in hallways with PT assist.   OBJECTIVE Temp:  [98.8 F (37.1 C)-99.2 F (37.3 C)] 98.9 F (37.2 C) (02/27 0847) Pulse Rate:  [64-71] 64 (02/27 0847) Cardiac Rhythm: Heart block (02/27 0700) Resp:  [18] 18 (02/27 0847) BP: (159-180)/(49-71) 159/61 (02/27 0847) SpO2:  [95 %-99 %] 98 % (02/27 0847)  No results for input(s): GLUCAP in the last 168 hours. Recent Labs  Lab 07/16/17 1624 07/18/17 0357 07/19/17 0836  NA 138 142 142  K 3.4* 3.5 3.7  CL 102 106 108  CO2 25 25 25   GLUCOSE 96 117* 115*  BUN 26* 19 16  CREATININE 0.87 0.94 0.83  CALCIUM 9.3 8.9 9.0   No results for input(s): AST, ALT, ALKPHOS, BILITOT, PROT, ALBUMIN in the last 168 hours. Recent Labs  Lab 07/16/17 1624 07/16/17 2257 07/18/17 0357 07/19/17 0836  WBC 7.8 9.6 9.6 9.3  NEUTROABS 4.8  --   --   --   HGB 12.6 12.8 11.8* 12.4  HCT 39.1 38.5 37.2 37.2  MCV 88.1 86.5 87.9 88.6  PLT 278 256 273 243   Recent Labs  Lab 07/16/17 1624  TROPONINI <0.03   Recent Labs    07/16/17 2257  LABPROT 24.9*  INR 2.28   Recent Labs    07/16/17 1603  COLORURINE YELLOW  LABSPEC 1.012  PHURINE 5.0  GLUCOSEU NEGATIVE  HGBUR NEGATIVE  BILIRUBINUR NEGATIVE  KETONESUR NEGATIVE  PROTEINUR NEGATIVE  NITRITE NEGATIVE  LEUKOCYTESUR SMALL*       Component Value Date/Time   CHOL 185 07/18/2017 0357   TRIG 139 07/18/2017 0357   HDL 72 07/18/2017 0357   CHOLHDL 2.6 07/18/2017 0357   VLDL 28 07/18/2017 0357   LDLCALC 85 07/18/2017 0357   Lab Results  Component Value Date   HGBA1C 6.0 (H) 07/18/2017      Component Value Date/Time    LABOPIA NONE DETECTED 07/17/2017 0748   COCAINSCRNUR NONE DETECTED 07/17/2017 0748   LABBENZ NONE DETECTED 07/17/2017 0748   AMPHETMU NONE DETECTED 07/17/2017 0748   THCU NONE DETECTED 07/17/2017 0748   LABBARB NONE DETECTED 07/17/2017 0748    No results for input(s): ETH in the last 168 hours.  I have personally reviewed the radiological images below and agree with the radiology interpretations.  Ct Angio Head W/cm &/or Wo Cm  Result Date: 07/16/2017 CLINICAL DATA:  Right parietal subarachnoid hemorrhage. EXAM: CT ANGIOGRAPHY HEAD TECHNIQUE: Multidetector CT imaging of the head was performed using the standard protocol during bolus administration of intravenous contrast. Multiplanar CT image reconstructions and MIPs were obtained to evaluate the vascular anatomy. CONTRAST:  <See Chart> ISOVUE-370 IOPAMIDOL (ISOVUE-370) INJECTION 76% COMPARISON:  CT head without contrast from the same day. MRI brain and MRA [facility] 09/08/2008 FINDINGS: CTA HEAD Anterior circulation: The internal carotid arteries are within normal limits from the high cervical segments through the ICA termini bilaterally. The right A1 segment is aplastic. Both A2 segments fill from the left with a patent anterior communicating artery. The left A1 segment is normal. M1 segments are within normal limits bilaterally. The MCA bifurcations are unremarkable. ACA and MCA branch vessels are  within normal limits. Posterior circulation: The right vertebral artery is slightly dominant to the left. There is a fenestration at the vertebrobasilar junction without aneurysm. Basilar artery is otherwise normal. Right posterior cerebral artery is of fetal type with a small right P1 segment. The left posterior cerebral artery originates from the basilar tip. Moderate narrowing is present in the proximal left P2 segment. Distal PCA branch vessels are intact bilaterally. Venous sinuses: The dural sinuses are patent bilaterally. The left transverse  sinus is dominant straight sinus and deep cerebral veins are intact. Cortical veins are unremarkable. Anatomic variants: Fetal type right posterior cerebral artery. Delayed phase: Delayed images demonstrate no pathologic enhancement. Focal hyperdensity in the right parietal lobe is stable. IMPRESSION: 1. No focal vascular lesion to explain subarachnoid hemorrhage. 2. Focal hyperdensity in the right parietal lobe, suspected subarachnoid hemorrhage, is stable. 3. Moderate narrowing of the proximal left P2 segment. 4. No other significant proximal stenosis, aneurysm, or branch vessel occlusion. Electronically Signed   By: San Morelle M.D.   On: 07/16/2017 19:01   Dg Chest 2 View  Result Date: 07/16/2017 CLINICAL DATA:  Hypertension.  Headache.  Weakness. EXAM: CHEST  2 VIEW COMPARISON:  12/07/2016 FINDINGS: Borderline enlargement of the cardiopericardial silhouette. Tortuous aorta with atherosclerotic calcification of the aortic arch and descending thoracic aorta. There is bandlike opacity at the left lung base which appears mildly increased from the 12/07/2016, and probably represents combination of atelectasis and scarring. The lungs appear otherwise clear. No pleural effusion. Mild thoracic spondylosis. IMPRESSION: 1. Bandlike opacity at the left lung base is increased compared to July 2018. Given the configuration, a combination of scarring and atelectasis is favored. 2.  Aortic Atherosclerosis (ICD10-I70.0). Electronically Signed   By: Van Clines M.D.   On: 07/16/2017 17:30   Ct Head Wo Contrast  Result Date: 07/18/2017 CLINICAL DATA:  84 y/o F; right frontal subarachnoid hemorrhage for follow-up. EXAM: CT HEAD WITHOUT CONTRAST TECHNIQUE: Contiguous axial images were obtained from the base of the skull through the vertex without intravenous contrast. COMPARISON:  07/17/2017 MRI of the head. FINDINGS: Brain: Decreased density within right parietal sulcus (series 3, image 23) likely  representing dispersion of subarachnoid hemorrhage. No new acute intracranial hemorrhage, stroke, or focal mass effect identified. Stable chronic microvascular ischemic changes and parenchymal volume loss of the brain. Vascular: No hyperdense vessel or unexpected calcification. Skull: Normal. Negative for fracture or focal lesion. Sinuses/Orbits: Stable small fluid level within the right sphenoid sinus. Otherwise negative. Bilateral intra-ocular lens replacement. Other: None. IMPRESSION: 1. Decreased density within right parietal sulcus, likely interval partial dispersion of subarachnoid hemorrhage. 2. No new acute intracranial abnormality identified. 3. Stable chronic microvascular ischemic changes and parenchymal volume loss of the brain. Electronically Signed   By: Kristine Garbe M.D.   On: 07/18/2017 05:46   Ct Head Wo Contrast  Result Date: 07/16/2017 CLINICAL DATA:  Hypertension.  Headache.  Weakness. EXAM: CT HEAD WITHOUT CONTRAST TECHNIQUE: Contiguous axial images were obtained from the base of the skull through the vertex without intravenous contrast. COMPARISON:  02/12/2013 FINDINGS: Brain: Periventricular white matter and corona radiata hypodensities favor chronic ischemic microvascular white matter disease. On image 22/2 and on images 18-22 of series 5 there subtle density along a sulcal margin which was not apparent on 02/12/2013. Otherwise, The brainstem, cerebellum, cerebral peduncles, thalami, basal ganglia, basilar cisterns, and ventricular system appear within normal limits. No mass lesion or acute CVA identified. Vascular: Unremarkable Skull: Unremarkable Sinuses/Orbits: Unremarkable Other: No supplemental non-categorized  findings. IMPRESSION: 1. Subtle hyperdensity along a sulcal margin in the right parietal lobe on image 22/2 is suspicious for a trace amount of subarachnoid hemorrhage. A venous angioma can sometimes give a similar appearance but this density was not present on  02/12/2013, and a small amount of subarachnoid hemorrhage is favored. 2. Periventricular white matter and corona radiata hypodensities favor chronic ischemic microvascular white matter disease. Critical Value/emergent results were called by telephone at the time of interpretation on 07/16/2017 at 5:28 pm to Dr. Francine Graven , who verbally acknowledged these results. Electronically Signed   By: Van Clines M.D.   On: 07/16/2017 17:28   Mr Brain Wo Contrast  Result Date: 07/17/2017 CLINICAL DATA:  Possible head injury this week. Follow-up suspected subarachnoid hemorrhage. EXAM: MRI HEAD WITHOUT CONTRAST TECHNIQUE: Multiplanar, multiecho pulse sequences of the brain and surrounding structures were obtained without intravenous contrast. COMPARISON:  CT HEAD July 16, 2017 FINDINGS: INTRACRANIAL CONTENTS: No reduced diffusion to suggest acute ischemia. No susceptibility artifact with particular attention to subarachnoid space. Moderate to severe parenchymal brain volume loss. No hydrocephalus. Patchy supratentorial white matter FLAIR T2 hyperintensities. No midline shift, mass effect or masses. VASCULAR: Normal major intracranial vascular flow voids present at skull base. SKULL AND UPPER CERVICAL SPINE: No abnormal sellar expansion. No suspicious calvarial bone marrow signal. Craniocervical junction maintained. SINUSES/ORBITS: Trace sphenoid sinus mucosal thickening. Mastoid air cells are well aerated. Included ocular globes and orbital contents are non-suspicious. Status post bilateral ocular lens implants. OTHER: None. IMPRESSION: 1. No acute intracranial process. No MR findings of subarachnoid hemorrhage though, CT is more sensitive in the acute setting. 2. Moderate to severe parenchymal brain volume loss. Mild chronic small vessel ischemic disease. Electronically Signed   By: Elon Alas M.D.   On: 07/17/2017 05:31   Carotid Doppler   Bilateral vertebral arteries antegrade flow. 1-39%  stenosis bilateral carotid arteries.   TTE - Left ventricle: The cavity size was mildly dilated. Wall   thickness was normal. Systolic function was normal. The estimated   ejection fraction was in the range of 55% to 60%. - Mitral valve: There was mild regurgitation. - Left atrium: The atrium was mildly dilated.   PHYSICAL EXAM  Temp:  [98.8 F (37.1 C)-99.2 F (37.3 C)] 98.9 F (37.2 C) (02/27 0847) Pulse Rate:  [64-71] 64 (02/27 0847) Resp:  [18] 18 (02/27 0847) BP: (159-180)/(49-71) 159/61 (02/27 0847) SpO2:  [95 %-99 %] 98 % (02/27 0847)  General - Well nourished, well developed, in no apparent distress.  Ophthalmologic - fundi not visualized due to noncooperation.  Cardiovascular - Regular rate and rhythm with no murmur.  Mental Status -  Level of arousal and orientation to time, place, and person were intact. Language including expression, naming, repetition, comprehension was assessed and found intact. Fund of Knowledge was assessed and was intact.  Cranial Nerves II - XII - II - Visual field intact OU. III, IV, VI - Extraocular movements intact. V - Facial sensation intact bilaterally. VII - Facial movement intact bilaterally. VIII - Hearing & vestibular intact bilaterally. X - Palate elevates symmetrically. XI - Chin turning & shoulder shrug intact bilaterally. XII - Tongue protrusion intact.  Motor Strength - The patient's strength was normal in all extremities except right knee pain on flexion and pronator drift was absent.  Bulk was normal and fasciculations were absent.   Motor Tone - Muscle tone was assessed at the neck and appendages and was normal. Reflexes - The patient's reflexes were  symmetrical in all extremities and she had no pathological reflexes. Sensory - Light touch, temperature/pinprick were assessed and were symmetrical.  Coordination - The patient had normal movements in the hands with no ataxia or dysmetria.  Tremor was absent. Gait and  Station - deferred.  ASSESSMENT/PLAN [name] is a 84 y.o. female with history of afib on Xarelto, HTN, HLD admitted for HA for 2-3 days, not feeling well. No tPA given due to [facility] and on Xarelto    ? Trace SAH right frontal - suspicious on CT but not demonstrated on MRI - likely due to HTN not in good control at home  Resultant HA  CT head concerning for trace SAH right frontal  MRI  No infarct, no SAH  CTA head no aneurysm, left P2 narrowing  Repeat CT decreased hyperdensity at right frontal trace SAH  Carotid Doppler -  Bilateral vertebral arteries antegrade flow. 1-39% stenosis bilateral carotid arteries.   2D Echo EF 55-60%  LDL 116  HgbA1c 6.0  UDS neg  SCDs for VTE prophylaxis Fall precautions  Diet regular Room service appropriate? Yes; Fluid consistency: Thin   Xarelto (rivaroxaban) daily prior to admission, now on No antithrombotic due to concerns of SAH  Ongoing aggressive stroke risk factor management  Therapy recommendations:  SNF  Disposition:  SNF vs HOME in AM  PAF  On Xarelto at home, currently on hold  Currently in SR  Continue hold off Xarelto   Continue Metoprolol for rate control  Repeat CT showed decreased hyperdensity at right frontal trace SAH  Need to follow up in clinic in one week and repeat CT. If SAH resolves, resume Xarelto  Hypertension Unstable, on the high end BP goal < 160 On home cardizem and metoprolol Off cardene Increase metoprolol to 50mg  bid Add hydralazine 25mg  Q8 Continue Amlodipine 10mg  today  Long term BP goal normotensive  Hyperlipidemia  Home meds:  none   LDL 116, goal < 70  Add pravastatin 20mg   Continue statin on discharge.  B12 deficiency  B12 105  B12 1059mcg IM x 1  B12 1015mcg po daily  Other Stroke Risk Factors  Advanced age  Other Active Problems  hypothyroidism - on synthroid - TSH WNL  Hypokalemia - Resolved, supplementation in progress  Hospital day #  3  Rosalin Hawking, MD PhD Stroke Neurology 07/19/2017 2:35 PM    To contact Stroke Continuity provider, please refer to [email or web address] After hours, contact General Neurology

## 2017-07-19 NOTE — Progress Notes (Signed)
Patient having severe pain on her Rt flank radiating to her neck and back of her head after getting up to use the bedside commode.  BP 180/71, taken prior to patient getting up.  Iv labetalol 20 mg given with tylenol 650.  Will continue to monitor patient closely.

## 2017-07-19 NOTE — Clinical Social Work Note (Signed)
Clinical Social Work Assessment  Patient Details  Name: [name] MRN: [id number] Date of Birth: 1924-04-07  Date of referral:  07/19/17               Reason for consult:  Facility Placement                Permission sought to share information with:  Chartered certified accountant granted to share information::  Yes, Verbal Permission Granted  Name::     Programmer, multimedia::  SNF  Relationship::  son, granddaughter  Sport and exercise psychologist Information:     Housing/Transportation Living arrangements for the past 2 months:  Single Family Home Source of Information:  Patient, Other (Comment Required)(graddaughter) Patient Interpreter Needed:  None Criminal Activity/Legal Involvement Pertinent to Current Situation/Hospitalization:  No - Comment as needed Significant Relationships:  Adult Children, Other Family Members Lives with:  Self Do you feel safe going back to the place where you live?  No Need for family participation in patient care:  Yes (Comment)(help with directing patient)  Care giving concerns:  Pt lives at home alone- currently weaker than normal and very unsteady at this time- family does not think pt safe to return home in current condition   Facilities manager / plan:  CSW met with pt granddaughter, pt, and Neuro PA to discuss PT recommendation for SNF.  Pt interested in going to [place] for short term care- CSW explained possible barriers to this- don't usually accept short term patients, reduced therapy, private pay for room and board- but patient adamant about trying and states she knows the administrator.  When SNF discussed patient becomes belligerent that she will go home if she can't go to [place].    Employment status:  Retired Nurse, adult PT Recommendations:  [place] / Referral to community resources:  [place]  Patient/Family's Response to care:  Pt very against going to  SNF but family and physician think its the best thing for her- unclear if patient will be agreeable in the end but at least agreeable to search being initiated.  Patient/Family's Understanding of and Emotional Response to Diagnosis, Current Treatment, and Prognosis:  Unclear level of understanding from patient about risks of returning home alone but family expresses understanding and is very concerned with convincing patient of this.  Emotional Assessment Appearance:  Appears stated age Attitude/Demeanor/Rapport:  Angry Affect (typically observed):  Explosive Orientation:  Oriented to Self, Oriented to Place, Oriented to  Time, Oriented to Situation Alcohol / Substance use:  Not Applicable Psych involvement (Current and /or in the community):  No (Comment)  Discharge Needs  Concerns to be addressed:  Care Coordination Readmission within the last 30 days:  No Current discharge risk:  Physical Impairment Barriers to Discharge:  Continued Medical Work up   Jorge Ny, LCSW 07/19/2017, 1:00 PM

## 2017-07-19 NOTE — Plan of Care (Signed)
progressing 

## 2017-07-19 NOTE — Progress Notes (Signed)
PT Cancellation Note  Patient Details Name: JOSSALIN CHERVENAK MRN: [id number] DOB: [birth year]   Cancelled Treatment:    Reason Eval/Treat Not Completed: Other (comment) Attempted to provide skilled PT treatment this morning; patient just receiving/starting breakfast and requests PT return after she eats. Plan to attempt to try back as/if schedule allows.    Deniece Ree PT, DPT, CBIS  Supplemental Physical Therapist [facility]   Pager [phone]

## 2017-07-19 NOTE — NC FL2 (Signed)
[place] LEVEL OF CARE SCREENING TOOL     IDENTIFICATION  Patient Name: [name] Birthdate: 24-Feb-1924 Sex: female Admission Date (Current Location): 07/16/2017  [facility] and [place] Number:  Herbalist and Address:  The [place] [place] [address] [place], [place] [address]      Provider Number: [id number]  Attending Physician Name and Address:  Rosalin Hawking, MD  Relative Name and Phone Number:  Richardson Landry, son, [phone]    Current Level of Care: Hospital Recommended Level of Care: [place] Prior Approval Number:    Date Approved/Denied:   PASRR Number: [id number]  Discharge Plan: SNF    Current Diagnoses: Patient Active Problem List   Diagnosis Date Noted  . HLD (hyperlipidemia) 07/18/2017  . B12 deficiency 07/18/2017  . SAH (subarachnoid hemorrhage) ([place]) 07/17/2017  . Hypertensive urgency 07/16/2017  . ICH (intracerebral hemorrhage) ([place]) 07/16/2017  . Cervical disc disease 05/09/2017  . Hypothyroid 05/09/2017  . Major depression in partial remission ([place]) 03/17/2016  . Atrial fibrillation ([place]) 01/12/2016  . Osteoarthritis of right knee 12/15/2015  . Major depression 09/22/2015  . Ventral hernia 05/21/2015  . Ovarian tumor 04/13/2015  . Squamous cell skin cancer 08/13/2014  . Impaired fasting glucose 08/05/2013  . Neuropathy 01/11/2013  . Long term (current) use of anticoagulants 07/27/2012  . Atrial fibrillation with RVR ([place]) 01/29/2012  . Anxiety 01/29/2012  . GERD (gastroesophageal reflux disease) 01/29/2012  . Chronic anticoagulation 01/29/2012  . Sciatica of right side 05/10/2011    [place] RESPIRATION BLADDER Height & Weight     Self, Time, Situation, Place  Normal Continent Weight: 72.6 kg (160 lb) Height:  5\' 4"  (162.6 cm)  BEHAVIORAL SYMPTOMS/MOOD NEUROLOGICAL BOWEL NUTRITION STATUS      Continent Diet(Please see DC Summary)  AMBULATORY STATUS COMMUNICATION OF NEEDS Skin    Limited Assist Verbally Normal                       Personal Care Assistance Level of Assistance  Bathing, Feeding, Dressing Bathing Assistance: Limited assistance Feeding assistance: Independent Dressing Assistance: Limited assistance     Functional Limitations Info             SPECIAL CARE FACTORS FREQUENCY  PT (By licensed PT), OT (By licensed OT)     PT Frequency: 5x/week OT Frequency: 3x/week            Contractures      Additional Factors Info  Code Status, Allergies Code Status Info: Full Allergies Info: Amiodarone, Amoxicillin, Cephalosporins, Lipitor Atorvastatin, Lorazepam, Lovenox Enoxaparin Sodium, Morphine And Related, Penicillins, Sulfa Antibiotics           Current Medications (07/19/2017):  This is the current hospital active medication list Current Facility-Administered Medications  Medication Dose Route Frequency Provider Last Rate Last Dose  . acetaminophen (TYLENOL) tablet 650 mg  650 mg Oral Q4H PRN Greta Doom, MD   650 mg at 07/19/17 6195  . amLODipine (NORVASC) tablet 10 mg  10 mg Oral Daily Rosalin Hawking, MD   10 mg at 07/19/17 0920  . diltiazem (CARDIZEM CD) 24 hr capsule 120 mg  120 mg Oral Daily Greta Doom, MD   120 mg at 07/19/17 0920  . docusate sodium (COLACE) capsule 100 mg  100 mg Oral BID Greta Doom, MD   100 mg at 07/19/17 0920  . labetalol (NORMODYNE,TRANDATE) injection 10-20 mg  10-20 mg Intravenous Q2H PRN Rosalin Hawking,  MD   20 mg at 07/19/17 1115  . levothyroxine (SYNTHROID, LEVOTHROID) tablet 75 mcg  75 mcg Oral QAC breakfast Greta Doom, MD   75 mcg at 07/19/17 5208  . metoprolol tartrate (LOPRESSOR) tablet 25 mg  25 mg Oral BID Rosalin Hawking, MD   25 mg at 07/19/17 0920  . ondansetron (ZOFRAN-ODT) disintegrating tablet 4 mg  4 mg Oral Q6H PRN Greta Doom, MD       Or  . ondansetron [facility]) injection 4 mg  4 mg Intravenous Q6H PRN Greta Doom, MD      .  pantoprazole (PROTONIX) EC tablet 40 mg  40 mg Oral Daily Greta Doom, MD   40 mg at 07/19/17 0920   Or  . pantoprazole sodium (PROTONIX) 40 mg/20 mL oral suspension 40 mg  40 mg Per Tube Daily Greta Doom, MD      . potassium chloride (K-DUR,KLOR-CON) CR tablet 10 mEq  10 mEq Oral Daily Greta Doom, MD   10 mEq at 07/19/17 0920  . pravastatin (PRAVACHOL) tablet 20 mg  20 mg Oral q1800 Rosalin Hawking, MD   20 mg at 07/18/17 1623  . vitamin B-12 (CYANOCOBALAMIN) tablet 1,000 mcg  1,000 mcg Oral Daily Rosalin Hawking, MD   1,000 mcg at 07/19/17 0920     Discharge Medications: Please see discharge summary for a list of discharge medications.  Relevant Imaging Results:  Relevant Lab Results:   Additional Information SSN: [place], [place]

## 2017-07-20 ENCOUNTER — Telehealth: Payer: Self-pay | Admitting: Family Medicine

## 2017-07-20 DIAGNOSIS — I10 Essential (primary) hypertension: Secondary | ICD-10-CM | POA: Diagnosis not present

## 2017-07-20 DIAGNOSIS — E119 Type 2 diabetes mellitus without complications: Secondary | ICD-10-CM | POA: Diagnosis not present

## 2017-07-20 DIAGNOSIS — K219 Gastro-esophageal reflux disease without esophagitis: Secondary | ICD-10-CM | POA: Diagnosis not present

## 2017-07-20 DIAGNOSIS — M8580 Other specified disorders of bone density and structure, unspecified site: Secondary | ICD-10-CM | POA: Diagnosis not present

## 2017-07-20 DIAGNOSIS — E538 Deficiency of other specified B group vitamins: Secondary | ICD-10-CM | POA: Diagnosis not present

## 2017-07-20 DIAGNOSIS — I609 Nontraumatic subarachnoid hemorrhage, unspecified: Secondary | ICD-10-CM | POA: Diagnosis not present

## 2017-07-20 DIAGNOSIS — M549 Dorsalgia, unspecified: Secondary | ICD-10-CM | POA: Diagnosis not present

## 2017-07-20 DIAGNOSIS — G8929 Other chronic pain: Secondary | ICD-10-CM | POA: Diagnosis not present

## 2017-07-20 DIAGNOSIS — I608 Other nontraumatic subarachnoid hemorrhage: Secondary | ICD-10-CM | POA: Diagnosis not present

## 2017-07-20 DIAGNOSIS — J449 Chronic obstructive pulmonary disease, unspecified: Secondary | ICD-10-CM | POA: Diagnosis not present

## 2017-07-20 DIAGNOSIS — R079 Chest pain, unspecified: Secondary | ICD-10-CM | POA: Diagnosis not present

## 2017-07-20 DIAGNOSIS — M25512 Pain in left shoulder: Secondary | ICD-10-CM | POA: Diagnosis not present

## 2017-07-20 DIAGNOSIS — E876 Hypokalemia: Secondary | ICD-10-CM | POA: Diagnosis not present

## 2017-07-20 DIAGNOSIS — I480 Paroxysmal atrial fibrillation: Secondary | ICD-10-CM | POA: Diagnosis not present

## 2017-07-20 DIAGNOSIS — E785 Hyperlipidemia, unspecified: Secondary | ICD-10-CM | POA: Diagnosis not present

## 2017-07-20 DIAGNOSIS — F419 Anxiety disorder, unspecified: Secondary | ICD-10-CM | POA: Diagnosis not present

## 2017-07-20 DIAGNOSIS — M6281 Muscle weakness (generalized): Secondary | ICD-10-CM | POA: Diagnosis not present

## 2017-07-20 DIAGNOSIS — E039 Hypothyroidism, unspecified: Secondary | ICD-10-CM | POA: Diagnosis not present

## 2017-07-20 DIAGNOSIS — I4891 Unspecified atrial fibrillation: Secondary | ICD-10-CM | POA: Diagnosis not present

## 2017-07-20 DIAGNOSIS — I5032 Chronic diastolic (congestive) heart failure: Secondary | ICD-10-CM | POA: Diagnosis not present

## 2017-07-20 DIAGNOSIS — I160 Hypertensive urgency: Secondary | ICD-10-CM | POA: Diagnosis not present

## 2017-07-20 LAB — CBC
HEMATOCRIT: 38.3 % (ref 36.0–46.0)
HEMOGLOBIN: 12.4 g/dL (ref 12.0–15.0)
MCH: 28.6 pg (ref 26.0–34.0)
MCHC: 32.4 g/dL (ref 30.0–36.0)
MCV: 88.2 fL (ref 78.0–100.0)
Platelets: 257 10*3/uL (ref 150–400)
RBC: 4.34 MIL/uL (ref 3.87–5.11)
RDW: 12.9 % (ref 11.5–15.5)
WBC: 9.3 10*3/uL (ref 4.0–10.5)

## 2017-07-20 LAB — BASIC METABOLIC PANEL
ANION GAP: 10 (ref 5–15)
BUN: 20 mg/dL (ref 6–20)
CHLORIDE: 106 mmol/L (ref 101–111)
CO2: 26 mmol/L (ref 22–32)
Calcium: 9.3 mg/dL (ref 8.9–10.3)
Creatinine, Ser: 0.93 mg/dL (ref 0.44–1.00)
GFR calc non Af Amer: 52 mL/min — ABNORMAL LOW (ref >60)
GLUCOSE: 125 mg/dL — AB (ref 65–99)
POTASSIUM: 4 mmol/L (ref 3.5–5.1)
Sodium: 142 mmol/L (ref 135–145)

## 2017-07-20 MED ORDER — CYANOCOBALAMIN 1000 MCG PO TABS: 1000.0000 ug | ORAL_TABLET | Freq: Every day | ORAL | 1 refills | Status: DC

## 2017-07-20 MED ORDER — HYDRALAZINE HCL 25 MG PO TABS: 25.0000 mg | ORAL_TABLET | Freq: Three times a day (TID) | ORAL | 1 refills | Status: DC

## 2017-07-20 MED ORDER — METOPROLOL TARTRATE 50 MG PO TABS: 50.0000 mg | ORAL_TABLET | Freq: Two times a day (BID) | ORAL | 1 refills | Status: DC

## 2017-07-20 MED ORDER — AMLODIPINE BESYLATE 10 MG PO TABS: 10.0000 mg | ORAL_TABLET | Freq: Every day | ORAL | 1 refills | Status: DC

## 2017-07-20 MED ORDER — PRAVASTATIN SODIUM 20 MG PO TABS: 20.0000 mg | ORAL_TABLET | Freq: Every day | ORAL | 1 refills | Status: DC

## 2017-07-20 NOTE — Progress Notes (Signed)
Patients BP continues to be elevated at 2000 BP was 172/69 pulse was 66 gave patient her scheduled PO cardiac medications now at 0000 BP is 170/52 pulse 62 and temp is now normal will give her PRN 10 mg of PRN  Labetalol  And recheck BP in 20 minutes.

## 2017-07-20 NOTE — Clinical Social Work Placement (Signed)
Nurse to call report to [phone], [place]  NOTE  Date:  07/20/2017  Patient Details  Name: [name] MRN: [id number] Date of Birth: [birth year]  Clinical Social Work is seeking post-discharge placement for this patient at the [place] level of care (*CSW will initial, date and re-position this form in  chart as items are completed):  Yes   Patient/family provided with [facility] Work Department's list of facilities offering this level of care within the geographic area requested by the patient (or if unable, by the patient's family).  Yes   Patient/family informed of their freedom to choose among providers that offer the needed level of care, that participate in Medicare, Medicaid or managed care program needed by the patient, have an available bed and are willing to accept the patient.  Yes   Patient/family informed of [facility]'s ownership interest in [facility] and [facility], as well as of the fact that they are under no obligation to receive care at these facilities.  PASRR submitted to EDS on 07/19/17     PASRR number received on 07/19/17     Existing PASRR number confirmed on       FL2 transmitted to all facilities in geographic area requested by pt/family on 07/19/17     FL2 transmitted to all facilities within larger geographic area on       Patient informed that his/her managed care company has contracts with or will negotiate with certain facilities, including the following:        Yes   Patient/family informed of bed offers received.  Patient chooses bed at [facility]     Physician recommends and patient chooses bed at      Patient to be transferred to [facility] on 07/20/17.  Patient to be transferred to facility by Family car     Patient family notified on 07/20/17 of transfer.  Name of family member notified:  Richardson Landry     PHYSICIAN     Additional Comment:    _______________________________________________ Geralynn Ochs, [place] 07/20/2017, 12:52 PM

## 2017-07-20 NOTE — Progress Notes (Signed)
CSW spoke with pt and pt grandson- pt now agreeable to SNF and prefers SNF in [place] area to be close to home- family to discuss this evening and have choice by tomorrow morning  Jorge Ny, [place] Social Worker [phone]

## 2017-07-20 NOTE — Progress Notes (Signed)
Attempted to call report. Pt being discharged to [facility] via daughter. Pt alert and oriented x4. VSS. Pt c/o no pain at this time. No signs of respiratory distress. Education complete and care plans resolved. IV removed with catheter intact and pt tolerated well. No further issues at this time. Pt to follow up with PCP. Leanne Chang, RN

## 2017-07-20 NOTE — Telephone Encounter (Signed)
Nurses patient was in the hospital with hypertensive urgency needs to follow-up within 7-14 days Nurse's-patient recently discharged from the hospital. Please call patient, let them know that we are aware that they were discharged from the hospital. Please schedule them to follow-up with [place] within the next 7 days. Advised the patient to bring all of their medications with him to the visit. Please inquire if they are having any acute issues currently and documented accordingly.

## 2017-07-20 NOTE — Progress Notes (Addendum)
Patient has received auth for transfer to SNF today- family chooses [facility] #: [id number] Next review date 3/2 Rug Level: RVB Approved for 688min/wk of therapy Case worker Ms. Jeneen Rinks #: [phone]  Jorge Ny, [place] Social Worker [phone]

## 2017-07-20 NOTE — Discharge Summary (Addendum)
Stroke Discharge Summary  Patient ID: [name]   MRN: [id number]      DOB: Jan 20, 1924  Date of Admission: 07/16/2017 Date of Discharge: 07/20/2017  Attending Physician:  Rosalin Hawking, MD, Stroke MD Consultant(s):   Treatment Team:  Stroke, Md, MD None  Patient's PCP:  [name], MD  DISCHARGE DIAGNOSIS:  Active Problems:   Hypertensive urgency   SAH (subarachnoid hemorrhage) ([place])   HLD (hyperlipidemia)   B12 deficiency   AFIB   Hypokalemia   Hypothyroidism  Past Medical History:  Diagnosis Date  . Abdominal pain, chronic, right lower quadrant    "ever since I had my appendix removed"  . Atrial fibrillation ([place])   . Cancer ([place])    left side  . Chronic back pain   . Edema   . GERD (gastroesophageal reflux disease)   . Hyperlipidemia   . Hypertension   . Osteopenia   . Osteoporosis   . Ovarian cyst 02/2015   left  . Recurrent abdominal pain    LLQ   Past Surgical History:  Procedure Laterality Date  . APPENDECTOMY    . APPENDECTOMY    . BREAST SURGERY    . CARDIOVERSION N/A 03/31/2016   Procedure: CARDIOVERSION;  Surgeon: Josue Hector, MD;  Location: [facility];  Service: Cardiovascular;  Laterality: N/A;  . COLON RESECTION    . COLON SURGERY    . HERNIA REPAIR    . HERNIA REPAIR     Allergies as of 07/20/2017      Reactions   Amiodarone Itching   Amoxicillin Hives   Cephalosporins Itching   Lipitor [atorvastatin] Other (See Comments)   Unknown:patient is not aware of this allergy   Lorazepam Other (See Comments)   Exceptional fatigue   Lovenox [enoxaparin Sodium] Nausea And Vomiting   Morphine And Related Itching   Penicillins Swelling   Has patient had a PCN reaction causing immediate rash, facial/tongue/throat swelling, SOB or lightheadedness with hypotension: unknown Has patient had a PCN reaction causing severe rash involving mucus membranes or skin necrosis: unknown Has patient had a PCN reaction that required hospitalization No Has  patient had a PCN reaction occurring within the last 10 years: no If all of the above answers are "NO", then may proceed with Cephalosporin use.   Sulfa Antibiotics Swelling      Medication List    STOP taking these medications   doxycycline 100 MG capsule Commonly known as:  VIBRAMYCIN   DULoxetine 20 MG capsule Commonly known as:  CYMBALTA   furosemide 20 MG tablet Commonly known as:  LASIX   HYDROcodone-acetaminophen 5-325 MG tablet Commonly known as:  NORCO/VICODIN   rivaroxaban 20 MG Tabs tablet Commonly known as:  XARELTO     TAKE these medications   amLODipine 10 MG tablet Commonly known as:  NORVASC Take 1 tablet (10 mg total) by mouth daily. Start taking on:  07/21/2017   cyanocobalamin 1000 MCG tablet Take 1 tablet (1,000 mcg total) by mouth daily. Start taking on:  07/21/2017   diltiazem 120 MG 24 hr capsule Commonly known as:  CARDIZEM CD TAKE (1) CAPSULE BY MOUTH ONCE DAILY.   hydrALAZINE 25 MG tablet Commonly known as:  APRESOLINE Take 1 tablet (25 mg total) by mouth every 8 (eight) hours.   levothyroxine 75 MCG tablet Commonly known as:  SYNTHROID, LEVOTHROID TAKE 1 TABLET BY MOUTH ONCE DAILY.   metoprolol tartrate 50 MG tablet Commonly known as:  LOPRESSOR  Take 1 tablet (50 mg total) by mouth 2 (two) times daily. What changed:    medication strength  how much to take   potassium chloride 10 MEQ tablet Commonly known as:  K-DUR Take 1 tablet (10 mEq total) by mouth daily.   pravastatin 20 MG tablet Commonly known as:  PRAVACHOL Take 1 tablet (20 mg total) by mouth daily at 6 PM.      LABORATORY STUDIES CBC    Component Value Date/Time   WBC 9.3 07/20/2017 0537   RBC 4.34 07/20/2017 0537   HGB 12.4 07/20/2017 0537   HGB 13.1 05/09/2017 1450   HCT 38.3 07/20/2017 0537   HCT 39.1 05/09/2017 1450   PLT 257 07/20/2017 0537   PLT 291 05/09/2017 1450   MCV 88.2 07/20/2017 0537   MCV 85 05/09/2017 1450   MCH 28.6 07/20/2017 0537    MCHC 32.4 07/20/2017 0537   RDW 12.9 07/20/2017 0537   RDW 13.6 05/09/2017 1450   LYMPHSABS 2.1 07/16/2017 1624   LYMPHSABS 2.1 05/09/2017 1450   MONOABS 0.8 07/16/2017 1624   EOSABS 0.1 07/16/2017 1624   EOSABS 0.1 05/09/2017 1450   BASOSABS 0.0 07/16/2017 1624   BASOSABS 0.0 05/09/2017 1450   CMP    Component Value Date/Time   NA 142 07/20/2017 0537   NA 142 05/09/2017 1450   K 4.0 07/20/2017 0537   CL 106 07/20/2017 0537   CO2 26 07/20/2017 0537   GLUCOSE 125 (H) 07/20/2017 0537   BUN 20 07/20/2017 0537   BUN 16 05/09/2017 1450   CREATININE 0.93 07/20/2017 0537   CREATININE 0.64 09/10/2013 0948   CALCIUM 9.3 07/20/2017 0537   PROT 7.0 05/09/2017 1450   ALBUMIN 4.4 05/09/2017 1450   AST 21 05/09/2017 1450   ALT 11 05/09/2017 1450   ALKPHOS 82 05/09/2017 1450   BILITOT 0.4 05/09/2017 1450   GFRNONAA 52 (L) 07/20/2017 0537   GFRAA >60 07/20/2017 0537   COAGS Lab Results  Component Value Date   INR 2.28 07/16/2017   INR 3.27 (H) 01/30/2012   INR 3.03 (H) 01/29/2012   Lipid Panel    Component Value Date/Time   CHOL 185 07/18/2017 0357   TRIG 139 07/18/2017 0357   HDL 72 07/18/2017 0357   CHOLHDL 2.6 07/18/2017 0357   VLDL 28 07/18/2017 0357   LDLCALC 85 07/18/2017 0357   HgbA1C  Lab Results  Component Value Date   HGBA1C 6.0 (H) 07/18/2017   Urinalysis    Component Value Date/Time   COLORURINE YELLOW 07/16/2017 1603   APPEARANCEUR CLEAR 07/16/2017 1603   LABSPEC 1.012 07/16/2017 1603   PHURINE 5.0 07/16/2017 1603   GLUCOSEU NEGATIVE 07/16/2017 1603   HGBUR NEGATIVE 07/16/2017 1603   BILIRUBINUR NEGATIVE 07/16/2017 1603   BILIRUBINUR + 12/20/2012 1107   KETONESUR NEGATIVE 07/16/2017 1603   PROTEINUR NEGATIVE 07/16/2017 1603   UROBILINOGEN 0.2 04/04/2015 1018   NITRITE NEGATIVE 07/16/2017 1603   LEUKOCYTESUR SMALL (A) 07/16/2017 1603   Urine Drug Screen     Component Value Date/Time   LABOPIA NONE DETECTED 07/17/2017 0748   COCAINSCRNUR NONE  DETECTED 07/17/2017 0748   LABBENZ NONE DETECTED 07/17/2017 0748   AMPHETMU NONE DETECTED 07/17/2017 0748   THCU NONE DETECTED 07/17/2017 0748   LABBARB NONE DETECTED 07/17/2017 0748    Alcohol Level No results found for: ETH   SIGNIFICANT DIAGNOSTIC STUDIES Ct Angio Head W/cm &/or Wo Cm  Result Date: 07/16/2017 CLINICAL DATA:  Right parietal subarachnoid hemorrhage. EXAM: CT  ANGIOGRAPHY HEAD TECHNIQUE: Multidetector CT imaging of the head was performed using the standard protocol during bolus administration of intravenous contrast. Multiplanar CT image reconstructions and MIPs were obtained to evaluate the vascular anatomy. CONTRAST:  <See Chart> ISOVUE-370 IOPAMIDOL (ISOVUE-370) INJECTION 76% COMPARISON:  CT head without contrast from the same day. MRI brain and MRA [place] 09/08/2008 FINDINGS: CTA HEAD Anterior circulation: The internal carotid arteries are within normal limits from the high cervical segments through the ICA termini bilaterally. The right A1 segment is aplastic. Both A2 segments fill from the left with a patent anterior communicating artery. The left A1 segment is normal. M1 segments are within normal limits bilaterally. The MCA bifurcations are unremarkable. ACA and MCA branch vessels are within normal limits. Posterior circulation: The right vertebral artery is slightly dominant to the left. There is a fenestration at the vertebrobasilar junction without aneurysm. Basilar artery is otherwise normal. Right posterior cerebral artery is of fetal type with a small right P1 segment. The left posterior cerebral artery originates from the basilar tip. Moderate narrowing is present in the proximal left P2 segment. Distal PCA branch vessels are intact bilaterally. Venous sinuses: The dural sinuses are patent bilaterally. The left transverse sinus is dominant straight sinus and deep cerebral veins are intact. Cortical veins are unremarkable. Anatomic variants: Fetal type right  posterior cerebral artery. Delayed phase: Delayed images demonstrate no pathologic enhancement. Focal hyperdensity in the right parietal lobe is stable. IMPRESSION: 1. No focal vascular lesion to explain subarachnoid hemorrhage. 2. Focal hyperdensity in the right parietal lobe, suspected subarachnoid hemorrhage, is stable. 3. Moderate narrowing of the proximal left P2 segment. 4. No other significant proximal stenosis, aneurysm, or branch vessel occlusion. Electronically Signed   By: San Morelle M.D.   On: 07/16/2017 19:01   Dg Chest 2 View  Result Date: 07/16/2017 CLINICAL DATA:  Hypertension.  Headache.  Weakness. EXAM: CHEST  2 VIEW COMPARISON:  12/07/2016 FINDINGS: Borderline enlargement of the cardiopericardial silhouette. Tortuous aorta with atherosclerotic calcification of the aortic arch and descending thoracic aorta. There is bandlike opacity at the left lung base which appears mildly increased from the 12/07/2016, and probably represents combination of atelectasis and scarring. The lungs appear otherwise clear. No pleural effusion. Mild thoracic spondylosis. IMPRESSION: 1. Bandlike opacity at the left lung base is increased compared to July 2018. Given the configuration, a combination of scarring and atelectasis is favored. 2.  Aortic Atherosclerosis (ICD10-I70.0). Electronically Signed   By: Van Clines M.D.   On: 07/16/2017 17:30   Ct Head Wo Contrast  Result Date: 07/18/2017 CLINICAL DATA:  84 y/o F; right frontal subarachnoid hemorrhage for follow-up. EXAM: CT HEAD WITHOUT CONTRAST TECHNIQUE: Contiguous axial images were obtained from the base of the skull through the vertex without intravenous contrast. COMPARISON:  07/17/2017 MRI of the head. FINDINGS: Brain: Decreased density within right parietal sulcus (series 3, image 23) likely representing dispersion of subarachnoid hemorrhage. No new acute intracranial hemorrhage, stroke, or focal mass effect identified. Stable  chronic microvascular ischemic changes and parenchymal volume loss of the brain. Vascular: No hyperdense vessel or unexpected calcification. Skull: Normal. Negative for fracture or focal lesion. Sinuses/Orbits: Stable small fluid level within the right sphenoid sinus. Otherwise negative. Bilateral intra-ocular lens replacement. Other: None. IMPRESSION: 1. Decreased density within right parietal sulcus, likely interval partial dispersion of subarachnoid hemorrhage. 2. No new acute intracranial abnormality identified. 3. Stable chronic microvascular ischemic changes and parenchymal volume loss of the brain. Electronically Signed   By: Mia [place]  [place] M.D.   On: 07/18/2017 05:46   Ct Head Wo Contrast  Result Date: 07/16/2017 CLINICAL DATA:  Hypertension.  Headache.  Weakness. EXAM: CT HEAD WITHOUT CONTRAST TECHNIQUE: Contiguous axial images were obtained from the base of the skull through the vertex without intravenous contrast. COMPARISON:  02/12/2013 FINDINGS: Brain: Periventricular white matter and corona radiata hypodensities favor chronic ischemic microvascular white matter disease. On image 22/2 and on images 18-22 of series 5 there subtle density along a sulcal margin which was not apparent on 02/12/2013. Otherwise, The brainstem, cerebellum, cerebral peduncles, thalami, basal ganglia, basilar cisterns, and ventricular system appear within normal limits. No mass lesion or acute CVA identified. Vascular: Unremarkable Skull: Unremarkable Sinuses/Orbits: Unremarkable Other: No supplemental non-categorized findings. IMPRESSION: 1. Subtle hyperdensity along a sulcal margin in the right parietal lobe on image 22/2 is suspicious for a trace amount of subarachnoid hemorrhage. A venous angioma can sometimes give a similar appearance but this density was not present on 02/12/2013, and a small amount of subarachnoid hemorrhage is favored. 2. Periventricular white matter and corona radiata hypodensities favor  chronic ischemic microvascular white matter disease. Critical Value/emergent results were called by telephone at the time of interpretation on 07/16/2017 at 5:28 pm to Dr. Francine Graven , who verbally acknowledged these results. Electronically Signed   By: Van Clines M.D.   On: 07/16/2017 17:28   Mr Brain Wo Contrast  Result Date: 07/17/2017 CLINICAL DATA:  Possible head injury this week. Follow-up suspected subarachnoid hemorrhage. EXAM: MRI HEAD WITHOUT CONTRAST TECHNIQUE: Multiplanar, multiecho pulse sequences of the brain and surrounding structures were obtained without intravenous contrast. COMPARISON:  CT HEAD July 16, 2017 FINDINGS: INTRACRANIAL CONTENTS: No reduced diffusion to suggest acute ischemia. No susceptibility artifact with particular attention to subarachnoid space. Moderate to severe parenchymal brain volume loss. No hydrocephalus. Patchy supratentorial white matter FLAIR T2 hyperintensities. No midline shift, mass effect or masses. VASCULAR: Normal major intracranial vascular flow voids present at skull base. SKULL AND UPPER CERVICAL SPINE: No abnormal sellar expansion. No suspicious calvarial bone marrow signal. Craniocervical junction maintained. SINUSES/ORBITS: Trace sphenoid sinus mucosal thickening. Mastoid air cells are well aerated. Included ocular globes and orbital contents are non-suspicious. Status post bilateral ocular lens implants. OTHER: None. IMPRESSION: 1. No acute intracranial process. No MR findings of subarachnoid hemorrhage though, CT is more sensitive in the acute setting. 2. Moderate to severe parenchymal brain volume loss. Mild chronic small vessel ischemic disease. Electronically Signed   By: Elon Alas M.D.   On: 07/17/2017 05:31    Carotid Doppler  Bilateral vertebral arteries antegrade flow. 1-39% stenosis bilateral carotid arteries.   TTE - Left ventricle: The cavity size was mildly dilated. Wall thickness was normal. Systolic  function was normal. The estimated ejection fraction was in the range of 55% to 60%. - Mitral valve: There was mild regurgitation. - Left atrium: The atrium was mildly dilated.    HISTORY OF [place]  [name] is a 84 y.o. female with a history of A. fib, hypertension, hyperlipidemia who states that she is just not been feeling well for the past few days.  She also has been complaining of headache for at least 3 or 4 days.  She denies any serious head trauma, but states she might have bumped it a couple of times over the past few days without really thinking anything of it.  LKW: 3 days ago tpa given?: no, ICH Modified  Rankin Scale: 0-Completely asymptomatic and back to baseline post- stroke  ASSESSMENT/PLAN [name] is a 84 y.o. female with history of afib on Xarelto, HTN, HLD admitted for HA for 2-3 days, not feeling well. No tPA given due to [facility] and on Xarelto    ? Trace SAH right frontal - suspicious on CT but not demonstrated on MRI - likely due to HTN not in good control at home  Resultant HA  CT head concerning for trace SAH right frontal  MRI  No infarct, no SAH  CTA head no aneurysm, left P2 narrowing  Repeat CT decreased hyperdensity at right frontal trace SAH  Carotid Doppler - Bilateral vertebral arteries antegrade flow. 1-39% stenosis bilateral carotid arteries.   2D Echo EF 55-60%  LDL 116  HgbA1c 6.0  UDS neg  SCDs for VTE prophylaxis  Fall precautions  Diet regular Room service appropriate? Yes; Fluid consistency: Thin   Xarelto (rivaroxaban) daily prior to admission, now on No antithrombotic due to concerns of SAH  Ongoing aggressive stroke risk factor management  Therapy recommendations:  SNF  Disposition:  [facility] today  PAF  On Xarelto at home, currently on hold  Currently in SR  Continue hold off Xarelto   Continue Metoprolol for rate control  Repeat CT showed  decreased hyperdensity at right frontal trace SAH  Need to follow up in clinic in one week and repeat CT. If SAH resolves, resume Xarelto  Hypertension  Stable  BP goal < 160  Continue Cardizem  Increase metoprolol to 50mg  bid  Continue hydralazine 25mg  Q8  Continue Amlodipine 10mg  daily  Long term BP goal normotensive  Hyperlipidemia  Home meds:  none   LDL 116, goal < 70  Add pravastatin 20mg   Continue statin on discharge.  B12 deficiency  B12 105  B12 1062mcg IM x 1  B12 1062mcg po daily  Other Stroke Risk Factors  Advanced age  Other Active Problems  hypothyroidism - on synthroid - TSH WNL  Hypokalemia - Resolved, supplementation in progress  DISCHARGE EXAM Blood pressure (!) 162/64, pulse (!) 59, temperature 98 F (36.7 C), temperature source Oral, resp. rate 18, height 5\' 4"  (1.626 m), weight 72.6 kg (160 lb), SpO2 97 %. General - Well nourished, well developed, in no apparent distress.  Ophthalmologic - fundi not visualized due to noncooperation.  Cardiovascular - Regular rate and rhythm with no murmur.  Mental Status -  Level of arousal and orientation to time, place, and person were intact. Language including expression, naming, repetition, comprehension was assessed and found intact. Fund of Knowledge was assessed and was intact.  Cranial Nerves II - XII - II - Visual field intact OU. III, IV, VI - Extraocular movements intact. V - Facial sensation intact bilaterally. VII - Facial movement intact bilaterally. VIII - Hearing & vestibular intact bilaterally. X - Palate elevates symmetrically. XI - Chin turning & shoulder shrug intact bilaterally. XII - Tongue protrusion intact.  Motor Strength - The patient's strength was normal in all extremities except right knee pain on flexion and pronator drift was absent.  Bulk was normal and fasciculations were absent.   Motor Tone - Muscle tone was assessed at the neck and appendages  and was normal. Reflexes - The patient's reflexes were symmetrical in all extremities and she had no pathological reflexes. Sensory - Light touch, temperature/pinprick were assessed and were symmetrical.  Coordination - The patient had normal movements in the hands with no ataxia or dysmetria.  Tremor was absent. Gait and Station - deferred.  Discharge Diet   Fall precautions Diet regular Room service appropriate? Yes; Fluid consistency: Thin liquids  DISCHARGE PLAN  Disposition:  [facility] SNF  No antithrombotic for secondary stroke prevention due to [place]  Ongoing risk factor control by Primary Care Physician at time of discharge  Follow-up [place], Elayne Snare, MD in 2 weeks.  Follow-up with [facility] in 1 weeks, office to schedule an appointment. Patient will need a follow up Head CT for [facility] and timing of restarting AC therapy for AFIB  Greater than 30 minutes were spent preparing discharge.  Mary Sella, ANP-C Neurology Stroke Team 07/20/2017 10:45 AM  I reviewed above note and agree with the assessment and plan. I have made any additions or clarifications directly to the above note. Pt was seen and examined.   Pt BP still on the high side but improved. Will need follow up with PCP for better BP control. She also needs to follow up with neurology stroke clinic at [facility] in one week to repeat CT and restart anticoagulation if CT trace SAH resolved.   Rosalin Hawking, MD PhD Stroke Neurology 07/20/2017 5:43 PM

## 2017-07-21 NOTE — Telephone Encounter (Signed)
I called no answer

## 2017-07-26 ENCOUNTER — Ambulatory Visit: Payer: Medicare Other | Admitting: Adult Health

## 2017-07-26 ENCOUNTER — Encounter: Payer: Self-pay | Admitting: Adult Health

## 2017-07-26 VITALS — BP 146/60 | HR 70 | Ht 64.0 in

## 2017-07-26 DIAGNOSIS — I480 Paroxysmal atrial fibrillation: Secondary | ICD-10-CM

## 2017-07-26 DIAGNOSIS — I609 Nontraumatic subarachnoid hemorrhage, unspecified: Secondary | ICD-10-CM | POA: Diagnosis not present

## 2017-07-26 DIAGNOSIS — E785 Hyperlipidemia, unspecified: Secondary | ICD-10-CM | POA: Diagnosis not present

## 2017-07-26 NOTE — Telephone Encounter (Signed)
Telephone call no answer 

## 2017-07-26 NOTE — Telephone Encounter (Signed)
Thank you :)

## 2017-07-26 NOTE — Progress Notes (Signed)
I reviewed above note and agree with the assessment and plan.  Rosalin Hawking, MD PhD Stroke Neurology 07/26/2017 6:34 PM

## 2017-07-26 NOTE — Progress Notes (Signed)
[facility] [address] [place]. [place] [address] [phone]       OFFICE FOLLOW UP NOTE  [name] Date of Birth:  1924/01/30 Medical Record Number:  [id number]   Reason for Referral: Hospital stroke follow-up  CHIEF COMPLAINT:  Chief Complaint  Patient presents with  . Follow-up    SAH (subarachnoid hemorrhage) follow up from hospital .Pt saw Dr. Erlinda Hong in hospital. Pt is at [facility] and [place]    HPI: [name] is being seen today in the office for possible right frontal SAH on 07/17/2017. History obtained from patient and chart review. Reviewed all radiology images and labs personally.  [name] is a 84 year old female with a history of A. fib on Xarelto, hypertension, and hyperlipidemia who states that she has not been feeling well for the past few days. She was also been complaining of a headache for at least 3-4 days.  Patient denies any serious head trauma, but states she might of bumped it a couple times over the past few days without really think anything of it.  No TPA given due to [facility] and on Xarelto.  CT scan reviewed and there was a concern for trace SAH right frontal. Xarelto was put on hold at this time. MRI reviewed and showed no infarct and no SAH.  CTA of head reviewed and showed no aneurysm but with left P2 narrowing.  CT scan repeated and showed a decreased hypodensity in the right frontal trace SAH.  Carotid Dopplers obtained and showed 1-39% stenosis in the bilateral carotid arteries.  2D echo showed EF of 55-60%.  LDL at 116.  A1c at 6.0.  It was recommended that patient is seen within 1 week of discharge and have a repeat CT scan.  If SAH resolves then Xarelto can be resumed.  Patient was placed on pravastatin 20 mg at discharge.  Patient was discharged to short term rehab facility, [facility].  Since discharge, patient has been doing well overall.  Patient denies recent headaches.  Patient does state  some difficulties with balance and memory but both have been improving.  Patient does continue to take pravastatin without side effects of myalgias.  Patient is still currently living at [facility] rehab center where she works with physical therapy and occupational therapy daily.  She is eager to get back home.  She does live alone but states that her son lives a mile down from her house.  Patient's blood pressure is on the slightly elevated side at 146/60 at today's visit.  Patient states her blood pressure is normally satisfactory while she is back home. Patient denies new or worsening TIA/stroke symptoms.  Patient is accompanied by rehab center nurse.   ROS:   14 system review of systems performed and negative with exception of easy bruising is also easy bleeding, joint pain, cramps, aching muscles, anxiety, and not enough sleep  PMH:  Past Medical History:  Diagnosis Date  . Abdominal pain, chronic, right lower quadrant    "ever since I had my appendix removed"  . Atrial fibrillation ([place])   . Cancer ([place])    left side  . Chronic back pain   . Edema   . GERD (gastroesophageal reflux disease)   . Hyperlipidemia   . Hypertension   . Osteopenia   . Osteoporosis   . Ovarian cyst 02/2015   left  . Recurrent abdominal pain    LLQ    PSH:  Past  Surgical History:  Procedure Laterality Date  . APPENDECTOMY    . APPENDECTOMY    . BREAST SURGERY    . CARDIOVERSION N/A 03/31/2016   Procedure: CARDIOVERSION;  Surgeon: Josue Hector, MD;  Location: [facility];  Service: Cardiovascular;  Laterality: N/A;  . COLON RESECTION    . COLON SURGERY    . HERNIA REPAIR    . HERNIA REPAIR      Social History:  Social History   Socioeconomic History  . Marital status: Widowed    Spouse name: Not on file  . Number of children: 1  . Years of education: Not on file  . Highest education level: Not on file  Social Needs  . Financial resource strain: Not on file  . Food insecurity - worry:  Not on file  . Food insecurity - inability: Not on file  . Transportation needs - medical: Not on file  . Transportation needs - non-medical: Not on file  Occupational History    Employer: RETIRED  Tobacco Use  . Smoking status: Never Smoker  . Smokeless tobacco: Never Used  Substance and Sexual Activity  . Alcohol use: No    Alcohol/week: 0.0 oz  . Drug use: No  . Sexual activity: No  Other Topics Concern  . Not on file  Social History Narrative  . Not on file    Family History:  Family History  Problem Relation Age of Onset  . Diabetes Mother   . Heart disease Unknown   . Arthritis Unknown   . Cancer Unknown   . Hyperlipidemia Brother     Medications:   Current Outpatient Medications on File Prior to Visit  Medication Sig Dispense Refill  . amLODipine (NORVASC) 10 MG tablet Take 1 tablet (10 mg total) by mouth daily. 30 tablet 1  . diltiazem (CARDIZEM CD) 120 MG 24 hr capsule TAKE (1) CAPSULE BY MOUTH ONCE DAILY. 90 capsule 3  . hydrALAZINE (APRESOLINE) 25 MG tablet Take 1 tablet (25 mg total) by mouth every 8 (eight) hours. 90 tablet 1  . levothyroxine (SYNTHROID, LEVOTHROID) 75 MCG tablet TAKE 1 TABLET BY MOUTH ONCE DAILY. 30 tablet 5  . metoprolol tartrate (LOPRESSOR) 50 MG tablet Take 1 tablet (50 mg total) by mouth 2 (two) times daily. 60 tablet 1  . potassium chloride (K-DUR) 10 MEQ tablet Take 1 tablet (10 mEq total) by mouth daily. 30 tablet 4  . pravastatin (PRAVACHOL) 20 MG tablet Take 1 tablet (20 mg total) by mouth daily at 6 PM. 30 tablet 1  . vitamin B-12 1000 MCG tablet Take 1 tablet (1,000 mcg total) by mouth daily. 30 tablet 1   No current facility-administered medications on file prior to visit.     Allergies:   Allergies  Allergen Reactions  . Amiodarone Itching  . Amoxicillin Hives  . Cephalosporins Itching  . Lipitor [Atorvastatin] Other (See Comments)    Unknown:patient is not aware of this allergy  . Lorazepam Other (See Comments)     Exceptional fatigue   . Lovenox [Enoxaparin Sodium] Nausea And Vomiting  . Morphine And Related Itching  . Penicillins Swelling    Has patient had a PCN reaction causing immediate rash, facial/tongue/throat swelling, SOB or lightheadedness with hypotension: unknown Has patient had a PCN reaction causing severe rash involving mucus membranes or skin necrosis: unknown Has patient had a PCN reaction that required hospitalization No Has patient had a PCN reaction occurring within the last 10 years: no If all of the  above answers are "NO", then may proceed with Cephalosporin use.   . Sulfa Antibiotics Swelling     Physical Exam  There were no vitals filed for this visit. There is no height or weight on file to calculate BMI. No exam data present  General: well developed, elderly caucasian female, well nourished, seated, in no evident distress Head: head normocephalic and atraumatic.   Neck: supple with no carotid or supraclavicular bruits Cardiovascular: regular rate and rhythm, no murmurs Musculoskeletal: no deformity Skin:  no rash/petichiae Vascular:  Normal pulses all extremities  Neurologic Exam Mental Status: Awake and fully alert. Oriented to place and time. AFT 10. Clock drawing 3/4. Recall 1/3.  Unable to perform serial additions. Remote memory intact. Attention span, concentration and fund of knowledge appropriate. Mood and affect appropriate.  Cranial Nerves: Fundoscopic exam reveals sharp disc margins. Pupils equal, briskly reactive to light. Extraocular movements full without nystagmus. Visual fields full to confrontation. Hearing intact. Facial sensation intact. Face, tongue, palate moves normally and symmetrically.  Motor: Normal bulk and tone. Normal strength in all tested extremity muscles. Sensory.: intact to touch , pinprick , position and vibratory sensation.  Coordination: Rapid alternating movements normal in all extremities. Finger-to-nose and heel-to-shin  performed accurately bilaterally. Gait and Station: Arises from chair without difficulty. Stance is normal. Gait demonstrates cautious stride with a stiffened right leg (patient states due to knee pain).  Positive Romberg did not attempt tandem gait due to balance difficulties. Reflexes: 1+ and symmetric. Toes downgoing.    NIHSS  0 Modified Rankin  2 HAS-BLED 4 points  -  Risk was 8.9% in one validation study and 8.70 bleeds per 100 patient-years in another validation study. CHA2DS2-VASc 7 points  - Stroke risk was 11.2% per year in >90,000 patients (the [place] Atrial Fibrillation Cohort Study) and 15.7% risk of stroke/TIA/systemic embolism.     Diagnostic Data (Labs, Imaging, Testing)  CT head 07/16/2017 1. Subtle hyperdensity along a sulcal margin in the right parietal lobe on image 22/2 is suspicious for a trace amount of subarachnoid hemorrhage. A venous angioma can sometimes give a similar appearance but this density was not present on 02/12/2013, and a small amount of subarachnoid hemorrhage is favored. 2. Periventricular white matter and corona radiata hypodensities favor chronic ischemic microvascular white matter disease.  CTA head 07/16/2017 1. No focal vascular lesion to explain subarachnoid hemorrhage. 2. Focal hyperdensity in the right parietal lobe, suspected subarachnoid hemorrhage, is stable. 3. Moderate narrowing of the proximal left P2 segment. 4. No other significant proximal stenosis, aneurysm, or branch vessel occlusion.    MRI brain 07/17/2017 1. No acute intracranial process. No MR findings of subarachnoid hemorrhage though, CT is more sensitive in the acute setting. 2. Moderate to severe parenchymal brain volume loss. Mild chronic small vessel ischemic disease.  CT head  CT head 07/18/2017 (repeat) 1. Decreased density within right parietal sulcus, likely interval partial dispersion of subarachnoid hemorrhage. 2. No new acute intracranial abnormality  identified. 3. Stable chronic microvascular ischemic changes and parenchymal volume loss of the brain.     ASSESSMENT: [name] is a 84 y.o. year old female here with right frontal SAH on 07/16/2017 secondary to Xarelto for atrial fibrillation.  He was placed on hold in order to be placed to obtain repeat CT scan.  Xarelto will be restarted if subarachnoid resolves.  Vascular risk factors include hypertension, hyperlipidemia, and atrial fibrillation.    PLAN: Continue pravastatin for secondary stroke prevention  Ordered CT scan  for [facility] follow-up  -restart Xarelto if there has been resolution of the [facility]  Continue PT/OT at the rehab facility  Return in 4 months or call earlier if needed   Greater than 50% of time during this 25  minute visit was spent on counseling,explanation of diagnosis, planning of further management, discussion with patient and family and coordination of care.    Venancio Poisson, AGNP-BC  [facility] [address] [place], [place] [address]  Phone [phone] Fax [phone]

## 2017-07-26 NOTE — Telephone Encounter (Signed)
Pt was d/c to [facility] per discharge note.

## 2017-07-26 NOTE — Patient Instructions (Addendum)
Your Plan:  Continue pravastatin for secondary stroke prevention  CT Scan head to follow up on brain bleed - we will restart the Xarelto if your bleed resolves  Continue PT/OT at the rehab facility  Return in 4 months or call earlier if needed    Thank you for coming to see [place] at [facility]. I hope we have been able to provide you high quality care today.  You may receive a patient satisfaction survey over the next few weeks. We would appreciate your feedback and comments so that we may continue to improve ourselves and the health of our patients.

## 2017-07-30 DIAGNOSIS — R079 Chest pain, unspecified: Secondary | ICD-10-CM | POA: Diagnosis not present

## 2017-08-01 DIAGNOSIS — R079 Chest pain, unspecified: Secondary | ICD-10-CM | POA: Diagnosis not present

## 2017-08-01 DIAGNOSIS — M25512 Pain in left shoulder: Secondary | ICD-10-CM | POA: Diagnosis not present

## 2017-08-14 ENCOUNTER — Telehealth: Payer: Self-pay | Admitting: Family Medicine

## 2017-08-14 NOTE — Telephone Encounter (Signed)
Scott to see. I will let him respond to fam concerns when he sees

## 2017-08-14 NOTE — Telephone Encounter (Signed)
Granddaughter aware we will let Dr.Scott see the message.[name] states pt also has a ct scan of brain the same day of the appt 08/17/2017 at 10 am with Dr.Scott and a Ct is sched the same day later that afternoon.Wanted to know if they need to cancel it as it may be too much for one day. I told her we would see her and determine that day to r/s it or go ahead with it.

## 2017-08-14 NOTE — Telephone Encounter (Signed)
Patients granddaughter, [name], called and wanted to let [name] know that [name] is staying at the [facility] currently.  She will be bringing her to her follow up appointment on 08/17/17.   She wanted to make note that she has digressed quite a bit.  She was given Lorazepam on 3 different occasions while at this facility, even though it was noted in her chart that she has an allergy to this.  She said she believes this contributed to the 2 falls that she has had at the facility, which led to pneumonia.  She said the facility refused to contact [name] to ask for recommendation on what she should be prescribed instead of the lorazepam.  Instead, they asked the granddaughter her opinion on what she should be prescribed which [name] felt uncomfortable with because she is not her doctor.

## 2017-08-17 ENCOUNTER — Ambulatory Visit: Payer: Medicare Other | Admitting: Family Medicine

## 2017-08-17 ENCOUNTER — Ambulatory Visit
Admission: RE | Admit: 2017-08-17 | Discharge: 2017-08-17 | Disposition: A | Discharge status: Home / Self Care | Payer: Medicare Other | Source: Ambulatory Visit | Attending: Adult Health | Admitting: Adult Health

## 2017-08-17 DIAGNOSIS — I609 Nontraumatic subarachnoid hemorrhage, unspecified: Secondary | ICD-10-CM | POA: Diagnosis not present

## 2017-08-18 ENCOUNTER — Ambulatory Visit (INDEPENDENT_AMBULATORY_CARE_PROVIDER_SITE_OTHER): Payer: Medicare Other | Admitting: Family Medicine

## 2017-08-18 VITALS — BP 130/84 | HR 94 | Ht 64.0 in | Wt 144.0 lb

## 2017-08-18 DIAGNOSIS — I609 Nontraumatic subarachnoid hemorrhage, unspecified: Secondary | ICD-10-CM

## 2017-08-18 DIAGNOSIS — I10 Essential (primary) hypertension: Secondary | ICD-10-CM | POA: Diagnosis not present

## 2017-08-18 NOTE — Addendum Note (Signed)
Addended by: Sallee Lange A on: 08/18/2017 08:50 PM   Modules accepted: Level of Service

## 2017-08-18 NOTE — Progress Notes (Signed)
Subjective:    Patient ID: [name], female    DOB: 1924/03/26, 84 y.o.   MRN: [id number]  HPIHospital and rehab follow up.   Patient had elevated blood pressure He was in the hospital with hypertensive emergency Head subarachnoid hemorrhage Was discharged from Hospital to [facility] rehabilitation center a large number of physical therapy papers as well as nursing home papers were reviewed  Lab work and x-rays and discharge summary from hospitalization reviewed with the patient and the family  Has not improved much while at the center.  Initially she was able to walk well with a walker but then she sustained 2 or 3 separate falls at the rehabilitation center that set her back.  She has not been able to transfer on her own.  Is not able to stand up on her own.  Has regressed.  Recently physical therapy felt she has improved enough to go home. The family states very adamantly that the patient is not able to go home.  They feel that the patient has regressed compared to when she went in.  They have asked for an appeal but states that they do not feel like anyone is on their side. Fell on the days that lorazepam was given.   Granddaughter states she was suppose to have a follow up with pcp and ct scan within one week of being discharged. Ct scan was done yesterday.  Initially there was supposed to be close follow-up with primary care but patient was admitted into nursing home which certainly intervened on that plan May now follow-up today.  50-minute visit with the patient  Review of Systems     Objective:   Physical Exam Neck no masses lungs are clear heart irregular pulses normal blood pressure good extremities pedal edema patient cannot stand up on her own it requires 2 people to help lift her.  Patient is not capable of walking on her own.       Assessment & Plan:  -Actual time was 50 minutes.  Greater than half was on discussion of her current health including recent  subarachnoid hemorrhage also discussing her significant fatigue as well as difficulty with ambulation and inability to improve-greater than half the time was spent discussing with family and discussing with the patient and coordination of further evaluation by physical therapy, appeals to her nursing home center for ongoing rehab with appeals to Medicare  Patient is a fall risk.  I recommend avoiding all benzodiazepines for sleep.  Patient would benefit from intense physical therapy to assist her with improving.  Patient is not capable of going home currently not capable of independent living  Patient has bilateral pedal edema that is related to blood pressure medication  Patient with the goal of going home currently unable to do so.  Certainly if the patient does not improve enough with physical therapy and there is a possibility she may be discharged from there but then placed within the nursing home under her own cost.  Family is aware of this but wants to avoid that as a possibility.  I did inform the family that if the patient has any ongoing leg pain she needs to bring this up to the nursing home so they they can adequately look into possibility of a blood clot.  They voiced understanding.  I voiced to the family that I do not have admitting privileges to the nursing home and therefore I am not able to order any tests or other similar  measures while she is in the nursing home certainly we will provide close follow-up upon discharge

## 2017-08-21 ENCOUNTER — Encounter: Payer: Self-pay | Admitting: Family Medicine

## 2017-08-21 ENCOUNTER — Telehealth: Payer: Self-pay | Admitting: Family Medicine

## 2017-08-21 ENCOUNTER — Telehealth: Payer: Self-pay | Admitting: Adult Health

## 2017-08-21 DIAGNOSIS — I609 Nontraumatic subarachnoid hemorrhage, unspecified: Secondary | ICD-10-CM

## 2017-08-21 DIAGNOSIS — Z7901 Long term (current) use of anticoagulants: Secondary | ICD-10-CM

## 2017-08-21 MED ORDER — RIVAROXABAN 20 MG PO TABS: 20.0000 mg | ORAL_TABLET | Freq: Every day | ORAL | 6 refills | Status: DC

## 2017-08-21 NOTE — Telephone Encounter (Signed)
Katherine Sandhoff, RN at [facility] to inform her that we will be restarting Xarelto for atrial fibrillation management. Recent CT scan showed an indeterminate calcification vs recurrent subarachnoid hemorrhage. After consulting with Dr. Erlinda Hong, he recommended restarting Xarelto but check an MRI scan in 2 weeks after starting this medication to ensure she does not have any further hemorrhage. Xarelto order faxed to Maudie Mercury, RN and MRI order placed.   Also spoke to [place] (patients granddaughter) in regards to recent test results and the plan to restart the Xarelto. She was very frustrated with the current facility stating her grandmother feel on March 9th and 12th after receiving lorazepam (which she is allergic to, per granddaughter) and she has not been the same since then. [name] describes her as being "unrecognizable". She is appealing their recommendations for patient to return back to her home and she is fearful of her grandmothers safety. She states that she will continue to pursue appealing her grandmothers discharge from the facility. No further questions and appreciated the call.

## 2017-08-21 NOTE — Telephone Encounter (Signed)
A letter was dictated.  Please print a copy of this letter.  I will be happy to sign this letter.  It may be given to the patient's granddaughter for appeal for the patient's physical therapy stay at nursing home facility in the [facility]

## 2017-08-22 ENCOUNTER — Telehealth: Payer: Self-pay | Admitting: Adult Health

## 2017-08-22 NOTE — Telephone Encounter (Signed)
Please assist her with getting this letter

## 2017-08-22 NOTE — Telephone Encounter (Signed)
pending faxed clinical notes. °

## 2017-08-23 NOTE — Telephone Encounter (Signed)
[employer] Medicare Josem Kaufmann: [id number] (exp. 08/22/17 to 09/20/17) order sent to [facility] they will contact the pt to schedule.

## 2017-08-23 NOTE — Telephone Encounter (Signed)
Letter is up front; left msg on granddaughter voicemail to return call

## 2017-08-24 DIAGNOSIS — J449 Chronic obstructive pulmonary disease, unspecified: Secondary | ICD-10-CM | POA: Diagnosis not present

## 2017-08-24 DIAGNOSIS — I509 Heart failure, unspecified: Secondary | ICD-10-CM | POA: Diagnosis not present

## 2017-08-25 ENCOUNTER — Telehealth: Payer: Self-pay | Admitting: Family Medicine

## 2017-08-25 NOTE — Telephone Encounter (Signed)
She was discharged from the rehab facility yesterday.

## 2017-08-25 NOTE — Telephone Encounter (Signed)
If this patient is in the [place] [facility] home the facility should be able to order this.  It would be fine to do 1 refill of each of these medicines.  In the long run if this patient is in the nursing home then nursing home will take over management of this area

## 2017-08-25 NOTE — Telephone Encounter (Signed)
Patients granddaughter, Jeannetta Nap, called to request refill on alprazolam 0.5 mg and hydrocodone 5-352 mg.  She is completely out of these medications.    [facility] on [address].

## 2017-08-25 NOTE — Telephone Encounter (Signed)
Left message for daughter to return call on Monday. We need to know the last dose that was prescribed.

## 2017-08-26 DIAGNOSIS — E119 Type 2 diabetes mellitus without complications: Secondary | ICD-10-CM | POA: Diagnosis not present

## 2017-08-26 DIAGNOSIS — Z7901 Long term (current) use of anticoagulants: Secondary | ICD-10-CM | POA: Diagnosis not present

## 2017-08-26 DIAGNOSIS — Z9981 Dependence on supplemental oxygen: Secondary | ICD-10-CM | POA: Diagnosis not present

## 2017-08-26 DIAGNOSIS — E039 Hypothyroidism, unspecified: Secondary | ICD-10-CM | POA: Diagnosis not present

## 2017-08-26 DIAGNOSIS — I110 Hypertensive heart disease with heart failure: Secondary | ICD-10-CM | POA: Diagnosis not present

## 2017-08-26 DIAGNOSIS — S066X0D Traumatic subarachnoid hemorrhage without loss of consciousness, subsequent encounter: Secondary | ICD-10-CM | POA: Diagnosis not present

## 2017-08-26 DIAGNOSIS — J449 Chronic obstructive pulmonary disease, unspecified: Secondary | ICD-10-CM | POA: Diagnosis not present

## 2017-08-26 DIAGNOSIS — E785 Hyperlipidemia, unspecified: Secondary | ICD-10-CM | POA: Diagnosis not present

## 2017-08-26 DIAGNOSIS — I509 Heart failure, unspecified: Secondary | ICD-10-CM | POA: Diagnosis not present

## 2017-08-28 ENCOUNTER — Other Ambulatory Visit: Payer: Self-pay | Admitting: Family Medicine

## 2017-08-28 MED ORDER — HYDROCODONE-ACETAMINOPHEN 5-325 MG PO TABS: ORAL_TABLET | ORAL | 0 refills | Status: DC

## 2017-08-28 MED ORDER — ALPRAZOLAM 0.5 MG PO TABS: 0.5000 mg | ORAL_TABLET | Freq: Every evening | ORAL | 1 refills | Status: DC | PRN

## 2017-08-28 NOTE — Telephone Encounter (Signed)
Per the Granddaughter Alprazolam 0.5 one -two QHS prn anxiety, and Hydrocodone 5-325 one Q 8 hours prn. Wants called to [place].

## 2017-08-28 NOTE — Telephone Encounter (Signed)
Hydrocodone 5 mg / 325 mg, #50, 1 3 times daily as needed pain cautioned drowsiness Alprazolam 0.5 mg 1 nightly as needed sleep, #30, 1 refill

## 2017-08-28 NOTE — Telephone Encounter (Signed)
Son stopped in to check on this states that he would like both prescriptions to be printed out so that he can pick them up. Pt is needing these today due to her not having any. Please advise.

## 2017-08-28 NOTE — Telephone Encounter (Signed)
Is it ok to still both of these meds times one with these directions below

## 2017-08-29 ENCOUNTER — Telehealth: Payer: Self-pay | Admitting: Family Medicine

## 2017-08-29 ENCOUNTER — Telehealth: Payer: Self-pay | Admitting: Internal Medicine

## 2017-08-29 NOTE — Telephone Encounter (Signed)
I would recommend that the patient take furosemide 20 mg tablet if she has it today, also recommend that when she comes for her office visit this week she brings all pill bottles no matter if she is taking them or not along with a list of what she is currently taking

## 2017-08-29 NOTE — Telephone Encounter (Signed)
Looks like patient has appointment with Dr Wolfgang Phoenix tomorrow, I would await his medical assessment, would anticipate starting a diuretic as he already mentioned. If early f/u is needed with [place] based on his opinion then we can arrange.     Zandra Abts MD

## 2017-08-29 NOTE — Telephone Encounter (Signed)
The prescriptions were printed and given to the grandson

## 2017-08-29 NOTE — Telephone Encounter (Signed)
Per phone call from [place]-- -  Pt was d/c from a facility last week, she has 3+ pitting edema in legs and feet , crackles, diminished breath sounds in lungs, productive cough, complaining of SOB, and she's not taking any Furosemide.   Can call Kendrick Fries @ [phone] or call grand daughter @ [phone]

## 2017-08-29 NOTE — Telephone Encounter (Signed)
Returned pt call, she states that her grandmother was taken off of lasix during her hospital stay. Upon leaving hospital she went into an assisted living facility for e few weeks and did not have any lasix while there.Her granddaughter is home trying to care for her, but states that her swelling keeps getting worse. Please advise.

## 2017-08-29 NOTE — Telephone Encounter (Signed)
Spoke with granddaughter [name]. She verbalized understanding. Granddaughter is going to bring all pill bottles and current meds with them to appt.

## 2017-08-29 NOTE — Telephone Encounter (Signed)
Kendrick Fries with [facility] called to report findings at home health visit today  Pt recently D/C'd from rehab - states some doctor told her to stop her Lasix  Pt does have Lasix at home but currently not taking (not real clear on why she's not taking or who told her not to take)  Having lots of leg swelling (3+ edema in legs per home health)  & wheezing & bad cough   Has appt tomorrow morning with Dr. Nicki Reaper & was advised to bring in all meds to get straightened out  Please advise - call Jeannetta Nap [phone]   Home health also placed a call to pt's cardiology Kendrick Fries with [facility] [phone])

## 2017-08-30 ENCOUNTER — Other Ambulatory Visit (HOSPITAL_COMMUNITY)
Admission: RE | Admit: 2017-08-30 | Discharge: 2017-08-30 | Disposition: A | Discharge status: Home / Self Care | Payer: Medicare Other | Source: Ambulatory Visit | Attending: Nurse Practitioner | Admitting: Nurse Practitioner

## 2017-08-30 ENCOUNTER — Encounter: Payer: Self-pay | Admitting: Family Medicine

## 2017-08-30 ENCOUNTER — Ambulatory Visit (INDEPENDENT_AMBULATORY_CARE_PROVIDER_SITE_OTHER): Payer: Medicare Other | Admitting: Family Medicine

## 2017-08-30 ENCOUNTER — Telehealth: Payer: Self-pay | Admitting: Family Medicine

## 2017-08-30 ENCOUNTER — Ambulatory Visit (HOSPITAL_COMMUNITY)
Admission: RE | Admit: 2017-08-30 | Discharge: 2017-08-30 | Disposition: A | Discharge status: Home / Self Care | Payer: Medicare Other | Source: Ambulatory Visit | Attending: Nurse Practitioner | Admitting: Nurse Practitioner

## 2017-08-30 VITALS — BP 118/78 | Ht 64.0 in

## 2017-08-30 DIAGNOSIS — E038 Other specified hypothyroidism: Secondary | ICD-10-CM

## 2017-08-30 DIAGNOSIS — R600 Localized edema: Secondary | ICD-10-CM | POA: Diagnosis not present

## 2017-08-30 DIAGNOSIS — R059 Cough, unspecified: Secondary | ICD-10-CM

## 2017-08-30 DIAGNOSIS — I509 Heart failure, unspecified: Secondary | ICD-10-CM | POA: Diagnosis not present

## 2017-08-30 DIAGNOSIS — R918 Other nonspecific abnormal finding of lung field: Secondary | ICD-10-CM | POA: Diagnosis not present

## 2017-08-30 DIAGNOSIS — I480 Paroxysmal atrial fibrillation: Secondary | ICD-10-CM

## 2017-08-30 DIAGNOSIS — R05 Cough: Secondary | ICD-10-CM | POA: Insufficient documentation

## 2017-08-30 DIAGNOSIS — S066X0A Traumatic subarachnoid hemorrhage without loss of consciousness, initial encounter: Secondary | ICD-10-CM | POA: Diagnosis not present

## 2017-08-30 DIAGNOSIS — J449 Chronic obstructive pulmonary disease, unspecified: Secondary | ICD-10-CM | POA: Diagnosis not present

## 2017-08-30 LAB — CBC WITH DIFFERENTIAL/PLATELET
BASOS PCT: 0 %
Basophils Absolute: 0 10*3/uL (ref 0.0–0.1)
EOS ABS: 0.3 10*3/uL (ref 0.0–0.7)
EOS PCT: 4 %
HCT: 31.3 % — ABNORMAL LOW (ref 36.0–46.0)
HEMOGLOBIN: 10.1 g/dL — AB (ref 12.0–15.0)
Lymphocytes Relative: 19 %
Lymphs Abs: 1.2 10*3/uL (ref 0.7–4.0)
MCH: 28.5 pg (ref 26.0–34.0)
MCHC: 32.3 g/dL (ref 30.0–36.0)
MCV: 88.2 fL (ref 78.0–100.0)
MONOS PCT: 11 %
Monocytes Absolute: 0.7 10*3/uL (ref 0.1–1.0)
Neutro Abs: 4.2 10*3/uL (ref 1.7–7.7)
Neutrophils Relative %: 66 %
PLATELETS: 325 10*3/uL (ref 150–400)
RBC: 3.55 MIL/uL — ABNORMAL LOW (ref 3.87–5.11)
RDW: 13.1 % (ref 11.5–15.5)
WBC: 6.3 10*3/uL (ref 4.0–10.5)

## 2017-08-30 LAB — BASIC METABOLIC PANEL
Anion gap: 10 (ref 5–15)
BUN: 12 mg/dL (ref 6–20)
CALCIUM: 9 mg/dL (ref 8.9–10.3)
CO2: 25 mmol/L (ref 22–32)
CREATININE: 0.98 mg/dL (ref 0.44–1.00)
Chloride: 106 mmol/L (ref 101–111)
GFR calc Af Amer: 57 mL/min — ABNORMAL LOW (ref >60)
GFR calc non Af Amer: 49 mL/min — ABNORMAL LOW (ref >60)
Glucose, Bld: 111 mg/dL — ABNORMAL HIGH (ref 65–99)
Potassium: 3.9 mmol/L (ref 3.5–5.1)
SODIUM: 141 mmol/L (ref 135–145)

## 2017-08-30 LAB — BRAIN NATRIURETIC PEPTIDE: B NATRIURETIC PEPTIDE 5: 232 pg/mL — AB (ref 0.0–100.0)

## 2017-08-30 LAB — TSH: TSH: 2.121 u[IU]/mL (ref 0.350–4.500)

## 2017-08-30 MED ORDER — METOPROLOL TARTRATE 50 MG PO TABS: 50.0000 mg | ORAL_TABLET | Freq: Two times a day (BID) | ORAL | 5 refills | Status: DC

## 2017-08-30 MED ORDER — POTASSIUM CHLORIDE ER 10 MEQ PO TBCR: 10.0000 meq | EXTENDED_RELEASE_TABLET | Freq: Every day | ORAL | 4 refills | Status: DC

## 2017-08-30 MED ORDER — FUROSEMIDE 20 MG PO TABS: 20.0000 mg | ORAL_TABLET | Freq: Every day | ORAL | 5 refills | Status: DC

## 2017-08-30 MED ORDER — AMLODIPINE BESYLATE 10 MG PO TABS
10.0000 mg | ORAL_TABLET | Freq: Every day | ORAL | 1 refills | Status: DC
Start: 2017-08-30 — End: 2017-10-17

## 2017-08-30 MED ORDER — DOXYCYCLINE HYCLATE 100 MG PO TABS: 100.0000 mg | ORAL_TABLET | Freq: Two times a day (BID) | ORAL | 0 refills | Status: DC

## 2017-08-30 MED ORDER — DILTIAZEM HCL ER COATED BEADS 120 MG PO CP24: ORAL_CAPSULE | ORAL | 3 refills | Status: DC

## 2017-08-30 MED ORDER — PRAVASTATIN SODIUM 20 MG PO TABS: 20.0000 mg | ORAL_TABLET | Freq: Every day | ORAL | 5 refills | Status: DC

## 2017-08-30 MED ORDER — HYDRALAZINE HCL 25 MG PO TABS: ORAL_TABLET | ORAL | 5 refills | Status: DC

## 2017-08-30 MED ORDER — LEVOTHYROXINE SODIUM 75 MCG PO TABS: 75.0000 ug | ORAL_TABLET | Freq: Every day | ORAL | 5 refills | Status: DC

## 2017-08-30 MED ORDER — SERTRALINE HCL 50 MG PO TABS
50.0000 mg | ORAL_TABLET | Freq: Every day | ORAL | 5 refills | Status: DC
Start: 2017-08-30 — End: 2017-10-11

## 2017-08-30 MED ORDER — RIVAROXABAN 20 MG PO TABS: 20.0000 mg | ORAL_TABLET | Freq: Every day | ORAL | 6 refills | Status: DC

## 2017-08-30 NOTE — Telephone Encounter (Signed)
Returned call to pt's granddaughter, asked her to let me know what Dr. Wolfgang Phoenix did as far as her medications. She voiced understanding.

## 2017-08-30 NOTE — Telephone Encounter (Signed)
[facility] with [facility].  Please notify them that we diagnosed the patient with pneumonia and started her on antibiotics.  It would be wise for home health to do a follow-up visit with the patient somewhere within the next 5-6 days and report to [place] if any problems.

## 2017-08-30 NOTE — Progress Notes (Signed)
   Subjective:    Patient ID: [name], female    DOB: 05-04-1924, 84 y.o.   MRN: [id number]  HPI Patient arrives for a follow up from recent stay in rehab center. Patient relates a lot of congestion coughing not feeling good relates a little bit of low-grade fever and some chills denies wheezing difficulty breathing but does state that she gets short of breath with activity relates a lot of fatigue and relates a lot of back pain also insomnia issues Recently taking hydrocodone for severe pain and taking Xanax at nighttime to help sleep Family and patient fully aware that Xanax increases her risk of falls Family with history of CHF not on any Lasix currently has had moderate swelling in the legs. Family brings in 3 separate bags of medications ones that she is taking ones that she no longer takes and another 1 of empty bottles there had to be at least 40 bottles to review Greater than half the time was spent reviewing each every medication of eliminating any medication that she should not be on and clarifying her medications plus also managing of her cough chills fatigue body aches insomnia and pedal edema Family wondering if patient should restart her lasix for her pedal edema.  Review of Systems  Constitutional: Positive for fatigue. Negative for activity change, appetite change and chills.  HENT: Negative for congestion and rhinorrhea.   Respiratory: Positive for shortness of breath. Negative for apnea and cough.   Cardiovascular: Positive for leg swelling. Negative for chest pain.  Gastrointestinal: Negative for abdominal pain, blood in stool and constipation.  Endocrine: Negative for polydipsia and polyphagia.  Musculoskeletal: Positive for arthralgias and back pain.  Skin: Negative for color change.  Neurological: Positive for dizziness. Negative for weakness and headaches.  Psychiatric/Behavioral: Negative for confusion.       Objective:   Physical Exam  Constitutional: She  appears well-developed and well-nourished. No distress.  HENT:  Head: Normocephalic and atraumatic.  Eyes: Right eye exhibits no discharge. Left eye exhibits no discharge.  Neck: No tracheal deviation present.  Cardiovascular: Normal rate, regular rhythm and normal heart sounds.  No murmur heard. Pulmonary/Chest: Effort normal and breath sounds normal. No respiratory distress. She has no wheezes. She has no rales.  Musculoskeletal: She exhibits edema. She exhibits no deformity.  Lymphadenopathy:    She has no cervical adenopathy.  Neurological: She is alert. She exhibits normal muscle tone.  Skin: Skin is warm and dry. No erythema.  Psychiatric: Her behavior is normal.  Vitals reviewed.         Assessment & Plan:  Atrial fibrillation under good control tolerating anticoagulant no bleeding Hypothyroidism continue medication lab work ordered await results Pedal edema with history of CHF reinitiate Lasix reinitiate potassium with Lasix X-ray lab work indicated await results Moderate cough congestion treat with antibiotics possibility of pneumonia monitor closely

## 2017-08-31 ENCOUNTER — Ambulatory Visit: Payer: Medicare Other | Admitting: Family Medicine

## 2017-08-31 NOTE — Telephone Encounter (Signed)
I called and spoke with [facility] with [facility]. She states they are keeping a close eye on the pt. They are aware you started antibiotics.Patient will be seen by them today , then on April 16. Pt on Friday and Monday. [phone]

## 2017-09-01 ENCOUNTER — Telehealth: Payer: Self-pay | Admitting: Family Medicine

## 2017-09-01 NOTE — Telephone Encounter (Signed)
Called granddaughter to see what pcp did about medications. No answer. Left message for her to return call.

## 2017-09-01 NOTE — Telephone Encounter (Signed)
Patient said that the pharmacy did not receive the Pravastatin 20 mg that was sent over by Dr. Nicki Reaper on 08/30/17.  [name] is requesting this resent to [facility] on [address].

## 2017-09-01 NOTE — Telephone Encounter (Addendum)
Spoke with pharmacist at [facility] they received the prescription and it is ready for pick up ( they stated it was too early to fill on the 10th). Family notified and verbalized understanding.

## 2017-09-09 ENCOUNTER — Encounter: Payer: Self-pay | Admitting: Family Medicine

## 2017-09-11 ENCOUNTER — Emergency Department (HOSPITAL_COMMUNITY)
Admission: EM | Admit: 2017-09-11 | Discharge: 2017-09-11 | Disposition: A | Discharge status: Home / Self Care | Payer: Medicare Other | Attending: Emergency Medicine | Admitting: Emergency Medicine

## 2017-09-11 ENCOUNTER — Emergency Department (HOSPITAL_COMMUNITY): Payer: Medicare Other

## 2017-09-11 ENCOUNTER — Telehealth: Payer: Self-pay | Admitting: Family Medicine

## 2017-09-11 ENCOUNTER — Other Ambulatory Visit: Payer: Self-pay | Admitting: *Deleted

## 2017-09-11 ENCOUNTER — Encounter (HOSPITAL_COMMUNITY): Payer: Self-pay

## 2017-09-11 ENCOUNTER — Other Ambulatory Visit: Payer: Self-pay

## 2017-09-11 DIAGNOSIS — R5383 Other fatigue: Secondary | ICD-10-CM | POA: Diagnosis not present

## 2017-09-11 DIAGNOSIS — Z79899 Other long term (current) drug therapy: Secondary | ICD-10-CM | POA: Insufficient documentation

## 2017-09-11 DIAGNOSIS — R296 Repeated falls: Secondary | ICD-10-CM | POA: Diagnosis not present

## 2017-09-11 DIAGNOSIS — I10 Essential (primary) hypertension: Secondary | ICD-10-CM | POA: Insufficient documentation

## 2017-09-11 DIAGNOSIS — Z859 Personal history of malignant neoplasm, unspecified: Secondary | ICD-10-CM | POA: Diagnosis not present

## 2017-09-11 DIAGNOSIS — S3993XA Unspecified injury of pelvis, initial encounter: Secondary | ICD-10-CM | POA: Diagnosis not present

## 2017-09-11 DIAGNOSIS — Z9181 History of falling: Secondary | ICD-10-CM | POA: Insufficient documentation

## 2017-09-11 DIAGNOSIS — R402411 Glasgow coma scale score 13-15, in the field [EMT or ambulance]: Secondary | ICD-10-CM | POA: Diagnosis not present

## 2017-09-11 DIAGNOSIS — R531 Weakness: Secondary | ICD-10-CM

## 2017-09-11 DIAGNOSIS — Z7901 Long term (current) use of anticoagulants: Secondary | ICD-10-CM | POA: Diagnosis not present

## 2017-09-11 DIAGNOSIS — Z85828 Personal history of other malignant neoplasm of skin: Secondary | ICD-10-CM | POA: Insufficient documentation

## 2017-09-11 LAB — BASIC METABOLIC PANEL
Anion gap: 10 (ref 5–15)
BUN: 9 mg/dL (ref 6–20)
CALCIUM: 8.9 mg/dL (ref 8.9–10.3)
CO2: 24 mmol/L (ref 22–32)
Chloride: 104 mmol/L (ref 101–111)
Creatinine, Ser: 0.88 mg/dL (ref 0.44–1.00)
GFR calc Af Amer: >60 mL/min (ref >60)
GFR, EST NON AFRICAN AMERICAN: 56 mL/min — AB (ref >60)
GLUCOSE: 106 mg/dL — AB (ref 65–99)
Potassium: 3 mmol/L — ABNORMAL LOW (ref 3.5–5.1)
SODIUM: 138 mmol/L (ref 135–145)

## 2017-09-11 LAB — CBC
HEMATOCRIT: 30.2 % — AB (ref 36.0–46.0)
Hemoglobin: 9.8 g/dL — ABNORMAL LOW (ref 12.0–15.0)
MCH: 28.2 pg (ref 26.0–34.0)
MCHC: 32.5 g/dL (ref 30.0–36.0)
MCV: 87 fL (ref 78.0–100.0)
PLATELETS: 270 10*3/uL (ref 150–400)
RBC: 3.47 MIL/uL — ABNORMAL LOW (ref 3.87–5.11)
RDW: 14 % (ref 11.5–15.5)
WBC: 7.5 10*3/uL (ref 4.0–10.5)

## 2017-09-11 LAB — DIFFERENTIAL
BASOS ABS: 0 10*3/uL (ref 0.0–0.1)
BASOS PCT: 0 %
EOS PCT: 3 %
Eosinophils Absolute: 0.2 10*3/uL (ref 0.0–0.7)
LYMPHS ABS: 1.8 10*3/uL (ref 0.7–4.0)
Lymphocytes Relative: 24 %
Monocytes Absolute: 0.5 10*3/uL (ref 0.1–1.0)
Monocytes Relative: 7 %
Neutro Abs: 5 10*3/uL (ref 1.7–7.7)
Neutrophils Relative %: 66 %

## 2017-09-11 LAB — URINALYSIS, ROUTINE W REFLEX MICROSCOPIC
BILIRUBIN URINE: NEGATIVE
Glucose, UA: NEGATIVE mg/dL
Hgb urine dipstick: NEGATIVE
Ketones, ur: NEGATIVE mg/dL
LEUKOCYTES UA: NEGATIVE
Nitrite: NEGATIVE
PH: 7 (ref 5.0–8.0)
PROTEIN: NEGATIVE mg/dL
Specific Gravity, Urine: 1.008 (ref 1.005–1.030)

## 2017-09-11 LAB — CBG MONITORING, ED: GLUCOSE-CAPILLARY: 95 mg/dL (ref 65–99)

## 2017-09-11 MED ORDER — ACETAMINOPHEN 325 MG PO TABS
650.0000 mg | ORAL_TABLET | Freq: Once | ORAL | Status: AC
  Administered 2017-09-11: 650 mg via ORAL
  Filled 2017-09-11: qty 2

## 2017-09-11 MED ORDER — POTASSIUM CHLORIDE CRYS ER 20 MEQ PO TBCR
40.0000 meq | EXTENDED_RELEASE_TABLET | Freq: Once | ORAL | Status: AC
  Administered 2017-09-11: 40 meq via ORAL
  Filled 2017-09-11: qty 2

## 2017-09-11 MED ORDER — METOPROLOL TARTRATE 25 MG PO TABS
50.0000 mg | ORAL_TABLET | Freq: Once | ORAL | Status: AC
  Administered 2017-09-11: 50 mg via ORAL
  Filled 2017-09-11: qty 2

## 2017-09-11 MED ORDER — FUROSEMIDE 20 MG PO TABS
20.0000 mg | ORAL_TABLET | Freq: Once | ORAL | Status: AC
  Administered 2017-09-11: 20 mg via ORAL
  Filled 2017-09-11: qty 1

## 2017-09-11 MED ORDER — SERTRALINE HCL 50 MG PO TABS
50.0000 mg | ORAL_TABLET | Freq: Every day | ORAL | Status: DC
  Administered 2017-09-11: 50 mg via ORAL
  Filled 2017-09-11: qty 1

## 2017-09-11 MED ORDER — DILTIAZEM HCL ER 120 MG PO CP24
120.0000 mg | ORAL_CAPSULE | Freq: Once | ORAL | Status: AC
  Administered 2017-09-11: 120 mg via ORAL
  Filled 2017-09-11: qty 1

## 2017-09-11 MED ORDER — AMLODIPINE BESYLATE 5 MG PO TABS
10.0000 mg | ORAL_TABLET | Freq: Once | ORAL | Status: AC
  Administered 2017-09-11: 10 mg via ORAL
  Filled 2017-09-11: qty 2

## 2017-09-11 NOTE — ED Notes (Signed)
Family come to nurses desk, requesting to speak to someone regarding her grandmother - for an order for home health. Called Mariann Laster - she is enroute to the pt's room.

## 2017-09-11 NOTE — Progress Notes (Signed)
CSW called [facility] for transport of pt to [address] [place], [place].   Wendelyn Breslow, [facility]  [phone]

## 2017-09-11 NOTE — ED Triage Notes (Signed)
Pt brought in by [facility] from home for frequent falls x2 days. Pt had two falls last night. The first one pt states she woke up on the floor, the second one occurred while pt was walking to the bathroom. Pt endorses increased weakness x2 days, reports dizziness on standing. Pt also endorses hallucinations witnessed by granddaughter. EMS states pt reported "a lot of people" standing around her when she fell last night. Per EMS pt is on blood thinners. Pt unable to report LOC, no deformities noted, pt denies neck and back pain. Per EMS pt is mentating at her baseline.

## 2017-09-11 NOTE — Telephone Encounter (Signed)
Pt's granddaughter called stating that she is currently in the ER at [facility]. She states that the pt fell twice and stated that she didn't feel right. Granddaughter is requesting a new order to be written for her home health care. Granddaughter states that they are only getting 3 hrs of home health through [facility]. She is wanting to know if they can get more to maximize her benefits. Pt states that she can have up to 8 hrs a day with no more than 35 hrs a week. They are wanting it through a different company. Please advise.

## 2017-09-11 NOTE — Telephone Encounter (Signed)
Granddaughter is aware that Dr. Nicki Reaper is out till Thursday and is fine with that.

## 2017-09-11 NOTE — ED Notes (Signed)
Pt given lunch

## 2017-09-11 NOTE — Telephone Encounter (Signed)
See my response to the pt advice request essentially same message

## 2017-09-11 NOTE — ED Notes (Signed)
Pure wick placed.

## 2017-09-11 NOTE — ED Notes (Signed)
Pt and pt's family verbalizes understanding of d/c instructions. Pt waiting for [facility].

## 2017-09-11 NOTE — ED Provider Notes (Signed)
[facility] EMERGENCY DEPARTMENT Provider Note   CSN: [id number] Arrival date & time: 09/11/17  0855     History   Chief Complaint Chief Complaint  Patient presents with  . Fall    HPI [name] is a 84 y.o. female.  Patient fell today.  Patient has had numerous falls.  She was in rehab but now has been at home and family has been taking care of her.  The history is provided by the patient and a relative. No language interpreter was used.  Fall  This is a new problem. The current episode started 12 to 24 hours ago. The problem occurs every several days. The problem has not changed since onset.Pertinent negatives include no chest pain, no abdominal pain and no headaches. Nothing aggravates the symptoms. Nothing relieves the symptoms. She has tried nothing for the symptoms. The treatment provided no relief.    Past Medical History:  Diagnosis Date  . Abdominal pain, chronic, right lower quadrant    "ever since I had my appendix removed"  . Atrial fibrillation ([place])   . Cancer ([place])    left side  . Chronic back pain   . Edema   . GERD (gastroesophageal reflux disease)   . Hyperlipidemia   . Hypertension   . Osteopenia   . Osteoporosis   . Ovarian cyst 02/2015   left  . Recurrent abdominal pain    LLQ    Patient Active Problem List   Diagnosis Date Noted  . HLD (hyperlipidemia) 07/18/2017  . B12 deficiency 07/18/2017  . SAH (subarachnoid hemorrhage) ([place]) 07/17/2017  . Hypertensive urgency 07/16/2017  . ICH (intracerebral hemorrhage) ([place]) 07/16/2017  . Cervical disc disease 05/09/2017  . Hypothyroid 05/09/2017  . Major depression in partial remission ([place]) 03/17/2016  . Atrial fibrillation ([place]) 01/12/2016  . Osteoarthritis of right knee 12/15/2015  . Major depression 09/22/2015  . Ventral hernia 05/21/2015  . Ovarian tumor 04/13/2015  . Squamous cell skin cancer 08/13/2014  . Impaired fasting glucose 08/05/2013  . Neuropathy  01/11/2013  . Long term (current) use of anticoagulants 07/27/2012  . Atrial fibrillation with RVR ([place]) 01/29/2012  . Anxiety 01/29/2012  . GERD (gastroesophageal reflux disease) 01/29/2012  . Chronic anticoagulation 01/29/2012  . Sciatica of right side 05/10/2011    Past Surgical History:  Procedure Laterality Date  . APPENDECTOMY    . APPENDECTOMY    . BREAST SURGERY    . CARDIOVERSION N/A 03/31/2016   Procedure: CARDIOVERSION;  Surgeon: Josue Hector, MD;  Location: [facility];  Service: Cardiovascular;  Laterality: N/A;  . COLON RESECTION    . COLON SURGERY    . HERNIA REPAIR    . HERNIA REPAIR       OB History    Gravida  2   Para  1   Term      Preterm      AB  1   Living  1     SAB  1   TAB      Ectopic      Multiple      Live Births               Home Medications    Prior to Admission medications   Medication Sig Start Date End Date Taking? Authorizing Provider  Acetaminophen (TYLENOL PO) Take 650 mg by mouth every 8 (eight) hours as needed (Pain).    Yes [provider]  ALPRAZolam Duanne Moron) 0.5 MG tablet Take  1 tablet (0.5 mg total) by mouth at bedtime as needed for anxiety or sleep. 08/28/17  Yes [name], MD  amLODipine (NORVASC) 10 MG tablet Take 1 tablet (10 mg total) by mouth daily. 08/30/17  Yes [name], MD  calcium carbonate (TUMS - DOSED IN MG ELEMENTAL CALCIUM) 500 MG chewable tablet Chew 1 tablet by mouth 2 (two) times daily.   Yes [provider]  diltiazem (CARDIZEM CD) 120 MG 24 hr capsule TAKE (1) CAPSULE BY MOUTH ONCE DAILY. 08/30/17  Yes [name], MD  furosemide (LASIX) 20 MG tablet Take 1 tablet (20 mg total) by mouth daily. 08/30/17  Yes [name], MD  hydrALAZINE (APRESOLINE) 25 MG tablet One q 12 hours Patient taking differently: Take 25 mg by mouth 2 (two) times daily.  08/30/17  Yes [name], MD  HYDROcodone-acetaminophen (NORCO/VICODIN) 5-325 MG tablet 1 pill 3 times daily as  needed severe pain cautioned drowsiness Patient taking differently: Take 1 tablet by mouth every 8 (eight) hours as needed for moderate pain.  08/28/17  Yes Luking, Elayne Snare, MD  levothyroxine (SYNTHROID, LEVOTHROID) 75 MCG tablet Take 1 tablet (75 mcg total) by mouth daily. 08/30/17  Yes [name], MD  metoprolol tartrate (LOPRESSOR) 50 MG tablet Take 1 tablet (50 mg total) by mouth 2 (two) times daily. 08/30/17  Yes [name], MD  Multiple Vitamin (MULTIVITAMIN) tablet Take 1 tablet by mouth daily.   Yes [provider]  potassium chloride (K-DUR) 10 MEQ tablet Take 1 tablet (10 mEq total) by mouth daily. 08/30/17  Yes [name], MD  pravastatin (PRAVACHOL) 20 MG tablet Take 1 tablet (20 mg total) by mouth daily at 6 PM. 08/30/17  Yes Luking, Elayne Snare, MD  rivaroxaban (XARELTO) 20 MG TABS tablet Take 1 tablet (20 mg total) by mouth daily with supper. 08/30/17  Yes [name], MD  sertraline (ZOLOFT) 50 MG tablet Take 1 tablet (50 mg total) by mouth daily. 08/30/17  Yes [name], MD  vitamin B-12 1000 MCG tablet Take 1 tablet (1,000 mcg total) by mouth daily. 07/21/17  Yes Costello, Kayren Eaves, NP  doxycycline (VIBRA-TABS) 100 MG tablet Take 1 tablet (100 mg total) by mouth 2 (two) times daily. Patient not taking: Reported on 09/11/2017 08/30/17   [name], MD    Family History Family History  Problem Relation Age of Onset  . Diabetes Mother   . Heart disease Unknown   . Arthritis Unknown   . Cancer Unknown   . Hyperlipidemia Brother     Social History Social History   Tobacco Use  . Smoking status: Never Smoker  . Smokeless tobacco: Never Used  Substance Use Topics  . Alcohol use: No    Alcohol/week: 0.0 oz  . Drug use: No     Allergies   Amiodarone; Amoxicillin; Cephalosporins; Lipitor [atorvastatin]; Lorazepam; Lovenox [enoxaparin sodium]; Morphine and related; Penicillins; and Sulfa antibiotics   Review of Systems Review of Systems    Constitutional: Positive for fatigue. Negative for appetite change.  HENT: Negative for congestion, ear discharge and sinus pressure.   Eyes: Negative for discharge.  Respiratory: Negative for cough.   Cardiovascular: Negative for chest pain.  Gastrointestinal: Negative for abdominal pain and diarrhea.  Genitourinary: Negative for frequency and hematuria.  Musculoskeletal: Negative for back pain.  Skin: Negative for rash.  Neurological: Negative for seizures and headaches.  Psychiatric/Behavioral: Negative for hallucinations.     Physical  Exam Updated Vital Signs BP (!) 147/60   Pulse 65   Resp 19   Ht 5\' 4"  (1.626 m)   Wt 68 kg (150 lb)   SpO2 95%   BMI 25.75 kg/m   Physical Exam  Constitutional: She is oriented to person, place, and time. She appears well-developed.  HENT:  Head: Normocephalic.  Contusion to right forehead.  Eyes: Conjunctivae and EOM are normal. No scleral icterus.  Neck: Neck supple. No thyromegaly present.  Cardiovascular: Normal rate and regular rhythm. Exam reveals no gallop and no friction rub.  No murmur heard. Pulmonary/Chest: No stridor. She has no wheezes. She has no rales. She exhibits no tenderness.  Abdominal: She exhibits no distension. There is no tenderness. There is no rebound.  Musculoskeletal: Normal range of motion. She exhibits no edema.  Lymphadenopathy:    She has no cervical adenopathy.  Neurological: She is oriented to person, place, and time. She exhibits normal muscle tone. Coordination normal.  Skin: No rash noted. No erythema.  Psychiatric: She has a normal mood and affect. Her behavior is normal.     ED Treatments / Results  Labs (all labs ordered are listed, but only abnormal results are displayed) Labs Reviewed  BASIC METABOLIC PANEL - Abnormal; Notable for the following components:      Result Value   Potassium 3.0 (*)    Glucose, Bld 106 (*)    GFR calc non Af Amer 56 (*)    All other components within normal  limits  CBC - Abnormal; Notable for the following components:   RBC 3.47 (*)    Hemoglobin 9.8 (*)    HCT 30.2 (*)    All other components within normal limits  DIFFERENTIAL  CBG MONITORING, ED    EKG None  Radiology Dg Pelvis 1-2 Views  Result Date: 09/11/2017 CLINICAL DATA:  Fall yesterday, mid pelvic soreness, initial encounter. EXAM: PELVIS - 1-2 VIEW COMPARISON:  CT pelvis 01/25/2016. FINDINGS: No acute osseous or joint abnormality. Hip joint space is uniform bilaterally. Sacroiliac joints are patent. IMPRESSION: No acute findings. Electronically Signed   By: [name] M.D.   On: 09/11/2017 10:10   Ct Head Wo Contrast  Result Date: 09/11/2017 CLINICAL DATA:  Recurrent falls over the past 2 days. Initial encounter. EXAM: CT HEAD WITHOUT CONTRAST CT CERVICAL SPINE WITHOUT CONTRAST TECHNIQUE: Multidetector CT imaging of the head and cervical spine was performed following the standard protocol without intravenous contrast. Multiplanar CT image reconstructions of the cervical spine were also generated. COMPARISON:  Head CT scan 08/17/2017 07/16/2017. FINDINGS: CT HEAD FINDINGS Brain: No evidence of acute infarction, hemorrhage, hydrocephalus, extra-axial collection or mass lesion/mass effect. There is some cortical atrophy and chronic microvascular ischemic change. Vascular: No hyperdense vessel or unexpected calcification. Skull: Intact. Sinuses/Orbits: The patient is status post bilateral lens extraction. Left mastoid effusion is new since the prior examinations. Other: None. CT CERVICAL SPINE FINDINGS Alignment: Straightening of lordosis is noted. Trace facet mediated anterolisthesis C4 on C5 is seen. Skull base and vertebrae: No acute fracture. No primary bone lesion or focal pathologic process. Soft tissues and spinal canal: No prevertebral fluid or swelling. No visible canal hematoma. Disc levels: Loss of disc space height is seen at all levels, worst at C5-6 and C6-7. Upper chest:  Mild dependent atelectasis noted. Other: None. IMPRESSION: Negative for trauma to the head or cervical spine. Left mastoid effusion is new since the prior examinations. Atrophy and chronic microvascular ischemic change. Atherosclerosis. Electronically Signed  By: Inge Rise M.D.   On: 09/11/2017 11:54   Ct Cervical Spine Wo Contrast  Result Date: 09/11/2017 CLINICAL DATA:  Recurrent falls over the past 2 days. Initial encounter. EXAM: CT HEAD WITHOUT CONTRAST CT CERVICAL SPINE WITHOUT CONTRAST TECHNIQUE: Multidetector CT imaging of the head and cervical spine was performed following the standard protocol without intravenous contrast. Multiplanar CT image reconstructions of the cervical spine were also generated. COMPARISON:  Head CT scan 08/17/2017 07/16/2017. FINDINGS: CT HEAD FINDINGS Brain: No evidence of acute infarction, hemorrhage, hydrocephalus, extra-axial collection or mass lesion/mass effect. There is some cortical atrophy and chronic microvascular ischemic change. Vascular: No hyperdense vessel or unexpected calcification. Skull: Intact. Sinuses/Orbits: The patient is status post bilateral lens extraction. Left mastoid effusion is new since the prior examinations. Other: None. CT CERVICAL SPINE FINDINGS Alignment: Straightening of lordosis is noted. Trace facet mediated anterolisthesis C4 on C5 is seen. Skull base and vertebrae: No acute fracture. No primary bone lesion or focal pathologic process. Soft tissues and spinal canal: No prevertebral fluid or swelling. No visible canal hematoma. Disc levels: Loss of disc space height is seen at all levels, worst at C5-6 and C6-7. Upper chest: Mild dependent atelectasis noted. Other: None. IMPRESSION: Negative for trauma to the head or cervical spine. Left mastoid effusion is new since the prior examinations. Atrophy and chronic microvascular ischemic change. Atherosclerosis. Electronically Signed   By: Inge Rise M.D.   On: 09/11/2017 11:54     Procedures Procedures (including critical care time)  Medications Ordered in ED Medications  diltiazem (CARDIZEM) tablet 120 mg (has no administration in time range)  sertraline (ZOLOFT) tablet 50 mg (has no administration in time range)  potassium chloride SA (K-DUR,KLOR-CON) CR tablet 40 mEq (40 mEq Oral Given 09/11/17 1424)  amLODipine (NORVASC) tablet 10 mg (10 mg Oral Given 09/11/17 1457)  furosemide (LASIX) tablet 20 mg (20 mg Oral Given 09/11/17 1458)  metoprolol tartrate (LOPRESSOR) tablet 50 mg (50 mg Oral Given 09/11/17 1457)  acetaminophen (TYLENOL) tablet 650 mg (650 mg Oral Given 09/11/17 1457)     Initial Impression / Assessment and Plan / ED Course  I have reviewed the triage vital signs and the nursing notes.  Pertinent labs & imaging results that were available during my care of the patient were reviewed by me and considered in my medical decision making (see chart for details).  Patient with fatigue he had numerous falls.  She has been in rehab now was at home.  Care manager is trying to arrange more home health care for the patient  Final Clinical Impressions(s) / ED Diagnoses   Final diagnoses:  None    ED Discharge Orders    None       Milton Ferguson, MD 09/11/17 1511

## 2017-09-11 NOTE — Telephone Encounter (Signed)
New order for home health put in. [name] notified through [facility]. Mychart messages sent to dr scott also to review.

## 2017-09-11 NOTE — ED Provider Notes (Signed)
Clinical Course as of Sep 11 1824  Mon Sep 11, 2017  1628 Assumed care from Dr. Roderic Palau.  Awaiting social work consult.  Of note, patient has increasing falls and weakness.  Her lab work is reassuring, but will add on a UA.   [CI]  1732 UA collected, will follow-up   [CI]  1744 Wanda, CM/SW, has seen pt and arranged good care at home w/ family. Just awaiting UA then will dispo   [CI]  1825 UA negative.  Discharge home.   [CI]    Clinical Course User Index [CI] Duffy Bruce, MD      Duffy Bruce, MD 09/11/17 [phone]

## 2017-09-11 NOTE — Telephone Encounter (Signed)
New order for home health referral. [name] notified through my chart.

## 2017-09-11 NOTE — ED Notes (Signed)
Pt's family returned stating [facility] was taking too long. Family got an uber for patient. Pt taken to lobby in wheelchair with all belongings.

## 2017-09-11 NOTE — ED Notes (Signed)
Wanda case management at bedside.

## 2017-09-12 ENCOUNTER — Encounter (INDEPENDENT_AMBULATORY_CARE_PROVIDER_SITE_OTHER): Payer: Self-pay

## 2017-09-14 DIAGNOSIS — I110 Hypertensive heart disease with heart failure: Secondary | ICD-10-CM | POA: Diagnosis not present

## 2017-09-14 DIAGNOSIS — E119 Type 2 diabetes mellitus without complications: Secondary | ICD-10-CM

## 2017-09-14 DIAGNOSIS — W01198D Fall on same level from slipping, tripping and stumbling with subsequent striking against other object, subsequent encounter: Secondary | ICD-10-CM | POA: Diagnosis not present

## 2017-09-14 DIAGNOSIS — S066X0D Traumatic subarachnoid hemorrhage without loss of consciousness, subsequent encounter: Secondary | ICD-10-CM | POA: Diagnosis not present

## 2017-09-14 DIAGNOSIS — I509 Heart failure, unspecified: Secondary | ICD-10-CM | POA: Diagnosis not present

## 2017-09-14 NOTE — Telephone Encounter (Signed)
Please try to assist patient getting maximum benefit.  Work together with [employer] in arranging this

## 2017-09-14 NOTE — Telephone Encounter (Signed)
Home health states new referral is needed even though they are going out to see pt. New referral was put in. Granddaughter Thomasene Lot was notified through a [employer]

## 2017-09-14 NOTE — Telephone Encounter (Signed)
I am certainly back increased amount of hours with home health.  Whenever the maximum that is allowed is fine with me.  If they are requesting a different home health company please work with the family regarding this

## 2017-09-15 ENCOUNTER — Telehealth: Payer: Self-pay | Admitting: Family Medicine

## 2017-09-15 NOTE — Telephone Encounter (Signed)
Cat is aware of all.

## 2017-09-15 NOTE — Telephone Encounter (Signed)
Please advise 

## 2017-09-15 NOTE — Telephone Encounter (Signed)
It would be fine to go ahead and give verbal approval of this

## 2017-09-15 NOTE — Telephone Encounter (Signed)
Physical therapy nurse Cat from home health needing verbal orders to continue therapy twice a week for the next 3 weeks. [phone] you can leave message for this if she doesn't answer

## 2017-09-15 NOTE — Telephone Encounter (Signed)
I called and left a message asked that Cat r/c.

## 2017-09-19 ENCOUNTER — Telehealth: Payer: Self-pay | Admitting: Family Medicine

## 2017-09-19 NOTE — Telephone Encounter (Signed)
FYI--[facility] the physical therapist has been working with her for 4 weeks but she had fall yesterday but no injuries just wanted you to know. [phone] if you have any questions or concerns

## 2017-09-19 NOTE — Telephone Encounter (Signed)
noted 

## 2017-09-20 ENCOUNTER — Telehealth: Payer: Self-pay | Admitting: Family Medicine

## 2017-09-20 NOTE — Telephone Encounter (Signed)
Requesting order to extend services for continuing education for 1 time a week for 4 weeks.

## 2017-09-20 NOTE — Telephone Encounter (Signed)
Verbal order given to sara from [facility]

## 2017-09-20 NOTE — Telephone Encounter (Signed)
[facility] for [name], ?'s on what days & times they'd like services and also need to get with Dr. Nicki Reaper on diagnosis

## 2017-09-20 NOTE — Telephone Encounter (Signed)
/  Please go ahead and give order for this

## 2017-09-21 ENCOUNTER — Other Ambulatory Visit: Payer: Self-pay | Admitting: *Deleted

## 2017-09-21 MED ORDER — PRAVASTATIN SODIUM 20 MG PO TABS: 20.0000 mg | ORAL_TABLET | Freq: Every day | ORAL | 1 refills | Status: DC

## 2017-09-23 DIAGNOSIS — I509 Heart failure, unspecified: Secondary | ICD-10-CM | POA: Diagnosis not present

## 2017-09-23 DIAGNOSIS — J449 Chronic obstructive pulmonary disease, unspecified: Secondary | ICD-10-CM | POA: Diagnosis not present

## 2017-09-25 NOTE — Telephone Encounter (Signed)
[facility] for [name], ?'s on home health referral needs so I may complete form & send in

## 2017-09-26 ENCOUNTER — Encounter: Payer: Self-pay | Admitting: Family Medicine

## 2017-09-26 ENCOUNTER — Ambulatory Visit (INDEPENDENT_AMBULATORY_CARE_PROVIDER_SITE_OTHER): Payer: Medicare Other | Admitting: Family Medicine

## 2017-09-26 VITALS — BP 132/84 | Temp 98.6°F | Ht 64.0 in

## 2017-09-26 DIAGNOSIS — R358 Other polyuria: Secondary | ICD-10-CM

## 2017-09-26 DIAGNOSIS — I482 Chronic atrial fibrillation, unspecified: Secondary | ICD-10-CM

## 2017-09-26 DIAGNOSIS — R531 Weakness: Secondary | ICD-10-CM

## 2017-09-26 DIAGNOSIS — R3589 Other polyuria: Secondary | ICD-10-CM

## 2017-09-26 DIAGNOSIS — R600 Localized edema: Secondary | ICD-10-CM

## 2017-09-26 NOTE — Patient Instructions (Signed)
Amlodipine change to 5 mg a day  Furosemide increase to 2 tablets each a.m.  Keep appointment for the 22 nd

## 2017-09-26 NOTE — Progress Notes (Signed)
   Subjective:    Patient ID: [name], female    DOB: 02-23-1924, 84 y.o.   MRN: [id number]  HPI  Patient arrives with swelling in her feet. Family states the patient took a fall on Friday. Family also reports the home health nurse told them her temp was 99.5 yesterday. This patient is homebound She minor fall She is doing better She just relates a lot of fatigue tiredness She walks around with a walker Her family checks in on her frequently She denies any major setbacks recently She does not feel good most of the time She denies feeling dizzy when she stands up Review of Systems  Constitutional: Negative for activity change, appetite change and fatigue.  HENT: Negative for congestion.   Respiratory: Negative for cough.   Cardiovascular: Negative for chest pain.  Gastrointestinal: Negative for abdominal pain.  Endocrine: Negative for polydipsia and polyphagia.  Skin: Negative for color change.  Neurological: Negative for weakness.  Psychiatric/Behavioral: Negative for confusion.       Objective:   Physical Exam  Constitutional: She appears well-developed and well-nourished. No distress.  HENT:  Head: Normocephalic and atraumatic.  Eyes: Right eye exhibits no discharge. Left eye exhibits no discharge.  Neck: No tracheal deviation present.  Cardiovascular: Normal rate, regular rhythm and normal heart sounds.  No murmur heard. Pulmonary/Chest: Effort normal and breath sounds normal. No respiratory distress. She has no wheezes. She has no rales.  Musculoskeletal: She exhibits edema.  Lymphadenopathy:    She has no cervical adenopathy.  Neurological: She is alert. She exhibits normal muscle tone.  Skin: Skin is warm and dry. No erythema.  Psychiatric: Her behavior is normal.  Vitals reviewed.  Blood pressure is on the low end at 110/70 which I think is overly aggressive for this patient Patient does have significant pedal edema in the lower legs bilateral no  tenderness       Assessment & Plan:  Severe pedal edema Increase Lasix 20 mg 2 each morning Decrease amlodipine from 10 mg to 5 mg Keep metoprolol at 50 mg twice daily Minimize Xanax to no more than 1/2 tablet each evening Patient is fall risk avoid hydrocodone to avoid Xanax during the day Currently can stay by herself during the day but eventually she will need 24/7 care Hopefully physical therapy could help her out with strengthening Family is asking if home health care can provide a to help her with bathing Follow-up patient again in a few weeks time Atrial fibrillation under decent control rate wise I find no current evidence of infection going on Because of low-grade fever will have home health collect a urine patient unable to give a urine

## 2017-09-27 NOTE — Progress Notes (Signed)
Spoke with patient to clarify which home health is coming out therefore I will know who to send order to. Pt stated that she would get the nurse that is coming out at 1pm to give nurse a call.

## 2017-09-27 NOTE — Progress Notes (Signed)
Spoke with [name] at [facility] and gave verbal order to do urine and urine culture.

## 2017-09-28 ENCOUNTER — Encounter: Payer: Self-pay | Admitting: Family Medicine

## 2017-09-28 DIAGNOSIS — N390 Urinary tract infection, site not specified: Secondary | ICD-10-CM | POA: Diagnosis not present

## 2017-09-29 ENCOUNTER — Telehealth: Payer: Self-pay | Admitting: Family Medicine

## 2017-09-29 ENCOUNTER — Emergency Department (HOSPITAL_COMMUNITY)
Admission: EM | Admit: 2017-09-29 | Discharge: 2017-09-29 | Disposition: A | Discharge status: Home / Self Care | Payer: Medicare Other | Attending: Emergency Medicine | Admitting: Emergency Medicine

## 2017-09-29 ENCOUNTER — Emergency Department (HOSPITAL_COMMUNITY): Payer: Medicare Other

## 2017-09-29 ENCOUNTER — Encounter (HOSPITAL_COMMUNITY): Payer: Self-pay | Admitting: *Deleted

## 2017-09-29 ENCOUNTER — Other Ambulatory Visit: Payer: Self-pay

## 2017-09-29 DIAGNOSIS — R600 Localized edema: Secondary | ICD-10-CM | POA: Diagnosis not present

## 2017-09-29 DIAGNOSIS — I10 Essential (primary) hypertension: Secondary | ICD-10-CM | POA: Diagnosis not present

## 2017-09-29 DIAGNOSIS — E039 Hypothyroidism, unspecified: Secondary | ICD-10-CM | POA: Insufficient documentation

## 2017-09-29 DIAGNOSIS — Z85828 Personal history of other malignant neoplasm of skin: Secondary | ICD-10-CM | POA: Diagnosis not present

## 2017-09-29 DIAGNOSIS — Z7901 Long term (current) use of anticoagulants: Secondary | ICD-10-CM | POA: Diagnosis not present

## 2017-09-29 DIAGNOSIS — Z79899 Other long term (current) drug therapy: Secondary | ICD-10-CM | POA: Insufficient documentation

## 2017-09-29 DIAGNOSIS — R609 Edema, unspecified: Secondary | ICD-10-CM

## 2017-09-29 DIAGNOSIS — J449 Chronic obstructive pulmonary disease, unspecified: Secondary | ICD-10-CM | POA: Diagnosis not present

## 2017-09-29 DIAGNOSIS — F329 Major depressive disorder, single episode, unspecified: Secondary | ICD-10-CM | POA: Diagnosis not present

## 2017-09-29 DIAGNOSIS — Z853 Personal history of malignant neoplasm of breast: Secondary | ICD-10-CM | POA: Diagnosis not present

## 2017-09-29 DIAGNOSIS — R2243 Localized swelling, mass and lump, lower limb, bilateral: Secondary | ICD-10-CM | POA: Diagnosis present

## 2017-09-29 DIAGNOSIS — F419 Anxiety disorder, unspecified: Secondary | ICD-10-CM | POA: Diagnosis not present

## 2017-09-29 DIAGNOSIS — D649 Anemia, unspecified: Secondary | ICD-10-CM

## 2017-09-29 DIAGNOSIS — I509 Heart failure, unspecified: Secondary | ICD-10-CM | POA: Diagnosis not present

## 2017-09-29 DIAGNOSIS — M7989 Other specified soft tissue disorders: Secondary | ICD-10-CM | POA: Diagnosis not present

## 2017-09-29 DIAGNOSIS — J9811 Atelectasis: Secondary | ICD-10-CM | POA: Diagnosis not present

## 2017-09-29 DIAGNOSIS — S066X0A Traumatic subarachnoid hemorrhage without loss of consciousness, initial encounter: Secondary | ICD-10-CM | POA: Diagnosis not present

## 2017-09-29 HISTORY — DX: Malignant neoplasm of unspecified site of unspecified female breast: C50.919

## 2017-09-29 LAB — CBC
HEMATOCRIT: 28.3 % — AB (ref 36.0–46.0)
Hemoglobin: 9.3 g/dL — ABNORMAL LOW (ref 12.0–15.0)
MCH: 28.7 pg (ref 26.0–34.0)
MCHC: 32.9 g/dL (ref 30.0–36.0)
MCV: 87.3 fL (ref 78.0–100.0)
Platelets: 325 10*3/uL (ref 150–400)
RBC: 3.24 MIL/uL — AB (ref 3.87–5.11)
RDW: 13.8 % (ref 11.5–15.5)
WBC: 8 10*3/uL (ref 4.0–10.5)

## 2017-09-29 LAB — COMPREHENSIVE METABOLIC PANEL
ALT: 16 U/L (ref 14–54)
AST: 22 U/L (ref 15–41)
Albumin: 3.9 g/dL (ref 3.5–5.0)
Alkaline Phosphatase: 63 U/L (ref 38–126)
Anion gap: 10 (ref 5–15)
BILIRUBIN TOTAL: 0.8 mg/dL (ref 0.3–1.2)
BUN: 15 mg/dL (ref 6–20)
CO2: 26 mmol/L (ref 22–32)
CREATININE: 1.08 mg/dL — AB (ref 0.44–1.00)
Calcium: 9.5 mg/dL (ref 8.9–10.3)
Chloride: 104 mmol/L (ref 101–111)
GFR, EST AFRICAN AMERICAN: 50 mL/min — AB (ref >60)
GFR, EST NON AFRICAN AMERICAN: 44 mL/min — AB (ref >60)
Glucose, Bld: 108 mg/dL — ABNORMAL HIGH (ref 65–99)
Potassium: 3.7 mmol/L (ref 3.5–5.1)
Sodium: 140 mmol/L (ref 135–145)
TOTAL PROTEIN: 7.2 g/dL (ref 6.5–8.1)

## 2017-09-29 LAB — BRAIN NATRIURETIC PEPTIDE: B Natriuretic Peptide: 367 pg/mL — ABNORMAL HIGH (ref 0.0–100.0)

## 2017-09-29 LAB — TROPONIN I

## 2017-09-29 MED ORDER — FUROSEMIDE 10 MG/ML IJ SOLN
40.0000 mg | Freq: Once | INTRAMUSCULAR | Status: AC
  Administered 2017-09-29: 40 mg via INTRAVENOUS
  Filled 2017-09-29: qty 4

## 2017-09-29 NOTE — Discharge Instructions (Addendum)
Please take 2 Lasix pills per day for the next 3 days.  Follow-up with Dr. Wolfgang Phoenix on Monday Return here if worse, especially worsened pain or shortness of breath

## 2017-09-29 NOTE — ED Triage Notes (Addendum)
Pt c/o swelling to bilateral lower extremities x 1 month. SOB x 2 weeks. Pt wears 2L of O2 via [place] continuously.

## 2017-09-29 NOTE — ED Provider Notes (Signed)
[facility] EMERGENCY DEPARTMENT Provider Note   CSN: [id number] Arrival date & time: 09/29/17  1552     History   Chief Complaint Chief Complaint  Patient presents with  . Leg Swelling    HPI Melanye KATISHA SHIMIZU is a 84 y.o. female. History obtained from patient and son HPI  84 yo female ho a fib, possible subarachnoid hemorrhage in February, on Xarelto presents with increased leg swelling.  Son reports she has had leg swelling in the past.  He states that the home health nurse came in today and there is some weeping from the right leg.  Home health nurse spoke with primary care physician, Dr. Wolfgang Phoenix and advised the patient be transported to the hospital.  Son patient states that she has had ongoing bilateral leg swelling and this is not particularly worse.  However, he states that she has been on oxygen in the past 2 weeks and had not required it continuously previously.  She denies any chest pain or shortness of breath.  She denies headache, vision changes, or change in strength.  She is ambulating as per usual with a walker.  She is currently living at home in her own home with home health coming in.  Her son is in most days but she is able to stay alone at night.  Son feels that she has been doing much better getting around and doing her activities of daily living.  Past Medical History:  Diagnosis Date  . Abdominal pain, chronic, right lower quadrant    "ever since I had my appendix removed"  . Atrial fibrillation ([place])   . Breast cancer ([place])   . Cancer ([place])    left side  . Chronic back pain   . Edema   . GERD (gastroesophageal reflux disease)   . Hyperlipidemia   . Hypertension   . Osteopenia   . Osteoporosis   . Ovarian cyst 02/2015   left  . Recurrent abdominal pain    LLQ    Patient Active Problem List   Diagnosis Date Noted  . HLD (hyperlipidemia) 07/18/2017  . B12 deficiency 07/18/2017  . SAH (subarachnoid hemorrhage) ([place]) 07/17/2017  . Hypertensive urgency  07/16/2017  . ICH (intracerebral hemorrhage) ([place]) 07/16/2017  . Cervical disc disease 05/09/2017  . Hypothyroid 05/09/2017  . Major depression in partial remission ([place]) 03/17/2016  . Atrial fibrillation ([place]) 01/12/2016  . Osteoarthritis of right knee 12/15/2015  . Major depression 09/22/2015  . Ventral hernia 05/21/2015  . Ovarian tumor 04/13/2015  . Squamous cell skin cancer 08/13/2014  . Impaired fasting glucose 08/05/2013  . Neuropathy 01/11/2013  . Long term (current) use of anticoagulants 07/27/2012  . Atrial fibrillation with RVR ([place]) 01/29/2012  . Anxiety 01/29/2012  . GERD (gastroesophageal reflux disease) 01/29/2012  . Chronic anticoagulation 01/29/2012  . Sciatica of right side 05/10/2011    Past Surgical History:  Procedure Laterality Date  . APPENDECTOMY    . APPENDECTOMY    . BREAST SURGERY Left    lumpectomy  . CARDIOVERSION N/A 03/31/2016   Procedure: CARDIOVERSION;  Surgeon: Josue Hector, MD;  Location: [facility];  Service: Cardiovascular;  Laterality: N/A;  . COLON RESECTION    . COLON SURGERY    . HERNIA REPAIR    . HERNIA REPAIR       OB History    Gravida  2   Para  1   Term      Preterm      AB  1  Living  1     SAB  1   TAB      Ectopic      Multiple      Live Births               Home Medications    Prior to Admission medications   Medication Sig Start Date End Date Taking? Authorizing Provider  Acetaminophen (TYLENOL PO) Take 650 mg by mouth every 8 (eight) hours as needed (Pain).     [provider]  ALPRAZolam Duanne Moron) 0.5 MG tablet Take 1 tablet (0.5 mg total) by mouth at bedtime as needed for anxiety or sleep. 08/28/17   [name], MD  amLODipine (NORVASC) 10 MG tablet Take 1 tablet (10 mg total) by mouth daily. 08/30/17   [name], MD  calcium carbonate (TUMS - DOSED IN MG ELEMENTAL CALCIUM) 500 MG chewable tablet Chew 1 tablet by mouth 2 (two) times daily.    [provider]  diltiazem  (CARDIZEM CD) 120 MG 24 hr capsule TAKE (1) CAPSULE BY MOUTH ONCE DAILY. 08/30/17   [name], MD  doxycycline (VIBRA-TABS) 100 MG tablet Take 1 tablet (100 mg total) by mouth 2 (two) times daily. Patient not taking: Reported on 09/11/2017 08/30/17   [name], MD  furosemide (LASIX) 20 MG tablet Take 1 tablet (20 mg total) by mouth daily. 08/30/17   [name], MD  hydrALAZINE (APRESOLINE) 25 MG tablet One q 12 hours Patient taking differently: Take 25 mg by mouth 2 (two) times daily.  08/30/17   [name], MD  HYDROcodone-acetaminophen (NORCO/VICODIN) 5-325 MG tablet 1 pill 3 times daily as needed severe pain cautioned drowsiness Patient taking differently: Take 1 tablet by mouth every 8 (eight) hours as needed for moderate pain.  08/28/17   [name], MD  levothyroxine (SYNTHROID, LEVOTHROID) 75 MCG tablet Take 1 tablet (75 mcg total) by mouth daily. 08/30/17   [name], MD  metoprolol tartrate (LOPRESSOR) 50 MG tablet Take 1 tablet (50 mg total) by mouth 2 (two) times daily. 08/30/17   [name], MD  Multiple Vitamin (MULTIVITAMIN) tablet Take 1 tablet by mouth daily.    [provider]  potassium chloride (K-DUR) 10 MEQ tablet Take 1 tablet (10 mEq total) by mouth daily. 08/30/17   [name], MD  pravastatin (PRAVACHOL) 20 MG tablet Take 1 tablet (20 mg total) by mouth daily at 6 PM. 09/21/17   Luking, Elayne Snare, MD  rivaroxaban (XARELTO) 20 MG TABS tablet Take 1 tablet (20 mg total) by mouth daily with supper. 08/30/17   [name], MD  sertraline (ZOLOFT) 50 MG tablet Take 1 tablet (50 mg total) by mouth daily. 08/30/17   [name], MD  vitamin B-12 1000 MCG tablet Take 1 tablet (1,000 mcg total) by mouth daily. 07/21/17   Mary Sella, NP    Family History Family History  Problem Relation Age of Onset  . Diabetes Mother   . Heart disease Unknown   . Arthritis Unknown   . Cancer Unknown   . Hyperlipidemia Brother     Social  History Social History   Tobacco Use  . Smoking status: Never Smoker  . Smokeless tobacco: Never Used  Substance Use Topics  . Alcohol use: No    Alcohol/week: 0.0 oz  . Drug use: No     Allergies   Amiodarone; Amoxicillin; Cephalosporins; Lipitor [atorvastatin]; Lorazepam; Lovenox [enoxaparin sodium];  Morphine and related; Penicillins; and Sulfa antibiotics   Review of Systems Review of Systems  Skin:       Leg swelling bilaterally  All other systems reviewed and are negative.    Physical Exam Updated Vital Signs BP (!) 144/49   Pulse 62   Temp 98.7 F (37.1 C)   Resp 18   Ht 1.626 m (5\' 4" )   Wt 68 kg (150 lb)   SpO2 97%   BMI 25.75 kg/m   Physical Exam  Constitutional: She appears well-developed and well-nourished. No distress.  HENT:  Head: Normocephalic and atraumatic.  Right Ear: External ear normal.  Left Ear: External ear normal.  Nose: Nose normal.  Mouth/Throat: Oropharynx is clear and moist.  Eyes: Pupils are equal, round, and reactive to light. EOM are normal.  Neck: Normal range of motion.  Cardiovascular: Normal rate, regular rhythm and normal heart sounds.  Pulmonary/Chest: Effort normal and breath sounds normal.  Abdominal: Soft. Bowel sounds are normal.  Musculoskeletal: She exhibits edema.  Mild erythema, some weeping, pitting bilateral pedal edema extending 1/2 up anterior shins  Neurological: She is alert.  Skin: Skin is warm and dry. Capillary refill takes less than 2 seconds.  Psychiatric: She has a normal mood and affect.  Nursing note and vitals reviewed.    ED Treatments / Results  Labs (all labs ordered are listed, but only abnormal results are displayed) Labs Reviewed  BRAIN NATRIURETIC PEPTIDE  CBC  COMPREHENSIVE METABOLIC PANEL  TROPONIN I    EKG EKG Interpretation  Date/Time:  Friday Sep 29 2017 16:35:30 EDT Ventricular Rate:  64 PR Interval:    QRS Duration: 96 QT Interval:  462 QTC Calculation: 477 R  Axis:   97 Text Interpretation:  Sinus rhythm Borderline prolonged PR interval Right axis deviation Baseline wander in lead(s) II III aVF V4 Confirmed by Pattricia Boss [phone]) on 09/29/2017 6:37:39 PM   Radiology US Venous Img Lower Bilateral  Result Date: 09/29/2017 CLINICAL DATA:  84 year old female with bilateral lower extremity edema EXAM: BILATERAL LOWER EXTREMITY VENOUS DOPPLER ULTRASOUND TECHNIQUE: Gray-scale sonography with graded compression, as well as color Doppler and duplex ultrasound were performed to evaluate the lower extremity deep venous systems from the level of the common femoral vein and including the common femoral, femoral, profunda femoral, popliteal and calf veins including the posterior tibial, peroneal and gastrocnemius veins when visible. The superficial great saphenous vein was also interrogated. Spectral Doppler was utilized to evaluate flow at rest and with distal augmentation maneuvers in the common femoral, femoral and popliteal veins. COMPARISON:  None. FINDINGS: RIGHT LOWER EXTREMITY Common Femoral Vein: No evidence of thrombus. Normal compressibility, respiratory phasicity and response to augmentation. Saphenofemoral Junction: No evidence of thrombus. Normal compressibility and flow on color Doppler imaging. Profunda Femoral Vein: No evidence of thrombus. Normal compressibility and flow on color Doppler imaging. Femoral Vein: No evidence of thrombus. Normal compressibility, respiratory phasicity and response to augmentation. Popliteal Vein: No evidence of thrombus. Normal compressibility, respiratory phasicity and response to augmentation. Calf Veins: No evidence of thrombus. Normal compressibility and flow on color Doppler imaging. Superficial Great Saphenous Vein: No evidence of thrombus. Normal compressibility. Venous Reflux:  None. Other Findings:  None. LEFT LOWER EXTREMITY Common Femoral Vein: No evidence of thrombus. Normal compressibility, respiratory phasicity and  response to augmentation. Saphenofemoral Junction: No evidence of thrombus. Normal compressibility and flow on color Doppler imaging. Profunda Femoral Vein: No evidence of thrombus. Normal compressibility and flow on color Doppler imaging. Femoral  Vein: No evidence of thrombus. Normal compressibility, respiratory phasicity and response to augmentation. Popliteal Vein: No evidence of thrombus. Normal compressibility, respiratory phasicity and response to augmentation. Calf Veins: No evidence of thrombus. Normal compressibility and flow on color Doppler imaging. Superficial Great Saphenous Vein: No evidence of thrombus. Normal compressibility. Venous Reflux:  None. Other Findings:  None. IMPRESSION: No evidence of deep venous thrombosis. Electronically Signed   By: Jacqulynn Cadet M.D.   On: 09/29/2017 17:51   Dg Chest Port 1 View  Result Date: 09/29/2017 CLINICAL DATA:  Lower extremity swelling x1 month. EXAM: PORTABLE CHEST 1 VIEW COMPARISON:  08/30/2017 FINDINGS: Stable borderline cardiomegaly with mild aortic atherosclerosis. Low lung volumes with bibasilar atelectasis and pulmonary vascular congestion. No pneumothorax. Degenerative changes are seen about the [facility] and glenohumeral joints bilaterally. With CHF IMPRESSION: Low lung volumes. Mild pulmonary vascular congestion with bibasilar atelectasis. Stable borderline cardiomegaly with aortic atherosclerosis. Electronically Signed   By: Ashley Royalty M.D.   On: 09/29/2017 17:54    Procedures Procedures (including critical care time)  Medications Ordered in ED Medications - No data to display   Initial Impression / Assessment and Plan / ED Course  I have reviewed the triage vital signs and the nursing notes.  Pertinent labs & imaging results that were available during my care of the patient were reviewed by me and considered in my medical decision making (see chart for details).     84 year old female with increased peripheral edema.  Work-up  here included ultrasounds of his lower extremities with no evidence of deep venous thrombosis.  Chest x-Kem Hensen obtained with mild vascular congestion.  BNP is slightly elevated at 367.  Patient is anemic with a hemoglobin of 9.3.  This is stable from first prior of 422 at 9.8.  However, it is trending down from February when it was normal.  There is no report of any blood loss here.  She is back on anticoagulation.  She is given additional dose of Lasix IV here at 40 mg IV.  She is on 20 mg at home.  Plan to increase her Lasix to 40 mg over the next 3 days and advised follow-up with primary care next week for recheck of hemoglobin and reevaluation of Lasix dose. Final Clinical Impressions(s) / ED Diagnoses   Final diagnoses:  Peripheral edema  Anemia, unspecified type    ED Discharge Orders    None       Pattricia Boss, MD 09/29/17 1840

## 2017-09-29 NOTE — ED Notes (Signed)
Pt noted to have 3 + pitting edema to bilateral lower ext. Weeping noted from right leg

## 2017-09-29 NOTE — Telephone Encounter (Signed)
[name], pts niece called. States she went to see her yesterday and pt stated that her socks were wet like her legs leaking, not feeling good. Pt stated to niece that she was feeling weird.  [facility] comes to see her and niece wanted to call them to see if they could come and check on her. Nurse advised niece to take pt to ER for eval. Niece said if she could get here to go, she would. Nurse stated that she could call EMS. Niece verbalized understanding

## 2017-09-29 NOTE — Telephone Encounter (Signed)
Review blood work results from [facility] in [place].

## 2017-09-29 NOTE — ED Notes (Signed)
Patient transported to Ultrasound 

## 2017-09-29 NOTE — Telephone Encounter (Signed)
Colletta Maryland from [facility] contacted office and stated that pt has lots of fluid in legs and legs are weeping, 3+edema in legs, SOB and not feeling well. Colletta Maryland wanted to know if pt would beneift from extra lasix. Consulted with Dr. Nicki Reaper, per Dr. Nicki Reaper, ER evaluation. Informed Colletta Maryland and she verbalized understanding.

## 2017-10-01 NOTE — Telephone Encounter (Signed)
Urinalysis was reviewed urine culture pending

## 2017-10-02 ENCOUNTER — Telehealth: Payer: Self-pay | Admitting: *Deleted

## 2017-10-02 ENCOUNTER — Other Ambulatory Visit: Payer: Self-pay | Admitting: *Deleted

## 2017-10-02 DIAGNOSIS — Z79899 Other long term (current) drug therapy: Secondary | ICD-10-CM

## 2017-10-02 MED ORDER — POTASSIUM CHLORIDE CRYS ER 10 MEQ PO TBCR: EXTENDED_RELEASE_TABLET | ORAL | 2 refills | Status: DC

## 2017-10-02 MED ORDER — TORSEMIDE 20 MG PO TABS: ORAL_TABLET | ORAL | 2 refills | Status: DC

## 2017-10-02 NOTE — Telephone Encounter (Signed)
I recommend stopping Lasix.  Let us start Demadex 20 mg-generic is called torsemide-take 2 pills each morning, also take 2 potassium 10 mEq each morning, may have 60 tablets of each with 3 refills.  Also home health to continue evaluation.  Give [place] update within a week.  Repeat metabolic 7 in 2 weeks time.  It if ongoing troubles or progressively worse will need to be seen.  Recommend follow-up within 3 to 4 weeks. (Should be noted that the echo showed good ejection fraction and recent creatinine reasonable)

## 2017-10-02 NOTE — Telephone Encounter (Signed)
Wendelyn Breslow called from [place], stating since she seen patient on Friday and the patient went to the ER and then went back home. Patient had another fall Friday and is in a lot of pain. Patient is refusing going back to the hospital. Wendelyn Breslow states she thinks patient needs to be in a rehab. All vitials are normal and not much therapy was done today due to the pain. [phone].

## 2017-10-02 NOTE — Telephone Encounter (Signed)
This patient was in a skilled facility in [facility] but then was released because she had met goals and they could not afford to keep her there.  Please see if [facility] has social worker that can talk with patient if she needs additional home care that is fine as for being in a rehab facility the patient would have to agree to this and it is doubtful that she will currently.  If home health has a Education officer, museum I would recommend a Education officer, museum consult

## 2017-10-02 NOTE — Telephone Encounter (Signed)
Taking lasix 20mg  2 daily for 2 days for 3 days. Tomorrow will go back to one daily. Son Richardson Landry states she was not using her walker when she fell going to the bathroom. Pt also did not have on her oxygen when she fell. [facility] nurse Wendelyn Breslow states oxygen does drop when she is not on it. Son states they have not seen it below 28 lately with oxygen off. [facility] nurse states legs are still swelling and weeping. Son states taking the extra lasix did not help any. She is just having soreness from the fall but not in much pain. Pt did not want to go to ED after fall because they didn't really help her with her legs.

## 2017-10-02 NOTE — Telephone Encounter (Signed)
Left message to return call 

## 2017-10-02 NOTE — Telephone Encounter (Signed)
Discussed with pt's son Richardson Landry. He verbalized understanding. meds sent to [facility]. Order for bmp put in. Left message with KaT from [facility] to see if they can do bloodwork or if pt needs to go to lab.

## 2017-10-03 NOTE — Telephone Encounter (Signed)
Order for BMP given to [place], RN at [facility]. Lucky verbalized understanding and stated the nurse will draw the lab work the week of May 20th at her visit.

## 2017-10-04 ENCOUNTER — Telehealth: Payer: Self-pay | Admitting: Family Medicine

## 2017-10-04 NOTE — Telephone Encounter (Signed)
Review lab results in results folder from [facility].

## 2017-10-04 NOTE — Telephone Encounter (Signed)
Urine shows multiple colonies.  This is more than likely contaminant.  I would only recommend treatment with antibiotics if patient having dysuria-please talk with family

## 2017-10-05 ENCOUNTER — Other Ambulatory Visit: Payer: Self-pay | Admitting: *Deleted

## 2017-10-05 MED ORDER — NITROFURANTOIN MONOHYD MACRO 100 MG PO CAPS: 100.0000 mg | ORAL_CAPSULE | Freq: Two times a day (BID) | ORAL | 0 refills | Status: DC

## 2017-10-05 NOTE — Telephone Encounter (Signed)
Discussed with pt and with pt's son Richardson Landry. Med sent to [facility] to deliver.

## 2017-10-05 NOTE — Telephone Encounter (Signed)
Called pt's son and he did not know if she was having symptoms. He said to call the patient. She is at home alone. Called pt and she states she is having low abdominal pain like she does when she has a uti, frequent urination and a little pain with urination.

## 2017-10-05 NOTE — Telephone Encounter (Signed)
Macrobid 100 mg 1 twice daily for 7 days-if ongoing or worsening problems may need follow-up visit or ER visit

## 2017-10-09 ENCOUNTER — Telehealth: Payer: Self-pay | Admitting: Family Medicine

## 2017-10-09 NOTE — Telephone Encounter (Signed)
Please advise 

## 2017-10-09 NOTE — Telephone Encounter (Signed)
I called and left a message

## 2017-10-09 NOTE — Telephone Encounter (Signed)
It is certainly fine to go ahead with the verbal to allow them to continue to see the patient

## 2017-10-09 NOTE — Telephone Encounter (Signed)
Tracy is aware

## 2017-10-09 NOTE — Telephone Encounter (Signed)
[name], social worker with [facility], is requesting verbal order to see [name] for another visit.  Please advise.

## 2017-10-10 ENCOUNTER — Ambulatory Visit: Payer: Medicare Other | Admitting: Family Medicine

## 2017-10-10 ENCOUNTER — Telehealth: Payer: Self-pay | Admitting: *Deleted

## 2017-10-10 MED ORDER — HYDROCODONE-ACETAMINOPHEN 5-325 MG PO TABS: ORAL_TABLET | ORAL | 0 refills | Status: DC

## 2017-10-10 NOTE — Telephone Encounter (Signed)
Signed and placed at the front for pick up.I called and left a message asked that they r/c.

## 2017-10-10 NOTE — Telephone Encounter (Signed)
[name] is aware to pick up script.

## 2017-10-10 NOTE — Telephone Encounter (Signed)
She may have hydrocodone 5 mg / 325 mg, #21, 1 taken 3 times daily as needed pain

## 2017-10-10 NOTE — Telephone Encounter (Signed)
Rx printed awaiting signature.

## 2017-10-10 NOTE — Telephone Encounter (Signed)
Please advise 

## 2017-10-10 NOTE — Telephone Encounter (Signed)
[name] came into the office for patient requesting hydrocodone to be refilled patient only has one left. Please advise [phone]

## 2017-10-10 NOTE — Telephone Encounter (Signed)
Faxed referral to [facility], they'll contact pt's granddaughter to set up

## 2017-10-11 ENCOUNTER — Ambulatory Visit: Payer: Medicare Other | Admitting: Family Medicine

## 2017-10-11 ENCOUNTER — Emergency Department (HOSPITAL_COMMUNITY): Payer: Medicare Other

## 2017-10-11 ENCOUNTER — Encounter (HOSPITAL_COMMUNITY): Payer: Self-pay

## 2017-10-11 ENCOUNTER — Other Ambulatory Visit: Payer: Self-pay

## 2017-10-11 ENCOUNTER — Emergency Department (HOSPITAL_COMMUNITY)
Admission: EM | Admit: 2017-10-11 | Discharge: 2017-10-11 | Disposition: A | Discharge status: Home / Self Care | Payer: Medicare Other | Attending: Emergency Medicine | Admitting: Emergency Medicine

## 2017-10-11 ENCOUNTER — Telehealth: Payer: Self-pay | Admitting: Family Medicine

## 2017-10-11 DIAGNOSIS — I519 Heart disease, unspecified: Secondary | ICD-10-CM | POA: Diagnosis not present

## 2017-10-11 DIAGNOSIS — R1013 Epigastric pain: Secondary | ICD-10-CM | POA: Diagnosis not present

## 2017-10-11 DIAGNOSIS — R0781 Pleurodynia: Secondary | ICD-10-CM | POA: Insufficient documentation

## 2017-10-11 DIAGNOSIS — I509 Heart failure, unspecified: Secondary | ICD-10-CM | POA: Diagnosis not present

## 2017-10-11 DIAGNOSIS — R079 Chest pain, unspecified: Secondary | ICD-10-CM | POA: Insufficient documentation

## 2017-10-11 DIAGNOSIS — I10 Essential (primary) hypertension: Secondary | ICD-10-CM | POA: Diagnosis not present

## 2017-10-11 DIAGNOSIS — R2243 Localized swelling, mass and lump, lower limb, bilateral: Secondary | ICD-10-CM | POA: Insufficient documentation

## 2017-10-11 DIAGNOSIS — R072 Precordial pain: Secondary | ICD-10-CM | POA: Diagnosis present

## 2017-10-11 DIAGNOSIS — R0602 Shortness of breath: Secondary | ICD-10-CM | POA: Insufficient documentation

## 2017-10-11 DIAGNOSIS — Z79899 Other long term (current) drug therapy: Secondary | ICD-10-CM | POA: Insufficient documentation

## 2017-10-11 DIAGNOSIS — I4891 Unspecified atrial fibrillation: Secondary | ICD-10-CM | POA: Diagnosis not present

## 2017-10-11 DIAGNOSIS — E039 Hypothyroidism, unspecified: Secondary | ICD-10-CM | POA: Insufficient documentation

## 2017-10-11 DIAGNOSIS — N3001 Acute cystitis with hematuria: Secondary | ICD-10-CM | POA: Diagnosis not present

## 2017-10-11 DIAGNOSIS — Z7901 Long term (current) use of anticoagulants: Secondary | ICD-10-CM | POA: Insufficient documentation

## 2017-10-11 LAB — COMPREHENSIVE METABOLIC PANEL
ALBUMIN: 3.9 g/dL (ref 3.5–5.0)
ALT: 14 U/L (ref 14–54)
AST: 23 U/L (ref 15–41)
Alkaline Phosphatase: 127 U/L — ABNORMAL HIGH (ref 38–126)
Anion gap: 10 (ref 5–15)
BILIRUBIN TOTAL: 0.7 mg/dL (ref 0.3–1.2)
BUN: 16 mg/dL (ref 6–20)
CHLORIDE: 100 mmol/L — AB (ref 101–111)
CO2: 29 mmol/L (ref 22–32)
Calcium: 9.2 mg/dL (ref 8.9–10.3)
Creatinine, Ser: 1.04 mg/dL — ABNORMAL HIGH (ref 0.44–1.00)
GFR calc Af Amer: 53 mL/min — ABNORMAL LOW (ref >60)
GFR calc non Af Amer: 46 mL/min — ABNORMAL LOW (ref >60)
GLUCOSE: 113 mg/dL — AB (ref 65–99)
POTASSIUM: 3.3 mmol/L — AB (ref 3.5–5.1)
Sodium: 139 mmol/L (ref 135–145)
TOTAL PROTEIN: 7.4 g/dL (ref 6.5–8.1)

## 2017-10-11 LAB — CBC
HCT: 32.1 % — ABNORMAL LOW (ref 36.0–46.0)
Hemoglobin: 10.2 g/dL — ABNORMAL LOW (ref 12.0–15.0)
MCH: 27.8 pg (ref 26.0–34.0)
MCHC: 31.8 g/dL (ref 30.0–36.0)
MCV: 87.5 fL (ref 78.0–100.0)
Platelets: 389 10*3/uL (ref 150–400)
RBC: 3.67 MIL/uL — ABNORMAL LOW (ref 3.87–5.11)
RDW: 13.3 % (ref 11.5–15.5)
WBC: 11.8 10*3/uL — ABNORMAL HIGH (ref 4.0–10.5)

## 2017-10-11 LAB — BRAIN NATRIURETIC PEPTIDE: B Natriuretic Peptide: 199 pg/mL — ABNORMAL HIGH (ref 0.0–100.0)

## 2017-10-11 LAB — URINALYSIS, ROUTINE W REFLEX MICROSCOPIC
Bacteria, UA: NONE SEEN
Bilirubin Urine: NEGATIVE
GLUCOSE, UA: NEGATIVE mg/dL
Hgb urine dipstick: NEGATIVE
Ketones, ur: NEGATIVE mg/dL
Nitrite: POSITIVE — AB
PROTEIN: 30 mg/dL — AB
SPECIFIC GRAVITY, URINE: 1.01 (ref 1.005–1.030)
WBC, UA: >50 WBC/hpf — ABNORMAL HIGH (ref 0–5)
pH: 8 (ref 5.0–8.0)

## 2017-10-11 LAB — LACTIC ACID, PLASMA
LACTIC ACID, VENOUS: 1.1 mmol/L (ref 0.5–1.9)
Lactic Acid, Venous: 1.1 mmol/L (ref 0.5–1.9)

## 2017-10-11 LAB — I-STAT TROPONIN, ED
Troponin i, poc: 0 ng/mL (ref 0.00–0.08)
Troponin i, poc: 0.01 ng/mL (ref 0.00–0.08)

## 2017-10-11 LAB — LIPASE, BLOOD: Lipase: 34 U/L (ref 11–51)

## 2017-10-11 MED ORDER — POTASSIUM CHLORIDE CRYS ER 20 MEQ PO TBCR
40.0000 meq | EXTENDED_RELEASE_TABLET | Freq: Once | ORAL | Status: AC
  Administered 2017-10-11: 40 meq via ORAL
  Filled 2017-10-11: qty 2

## 2017-10-11 MED ORDER — ACETAMINOPHEN 325 MG PO TABS
650.0000 mg | ORAL_TABLET | Freq: Once | ORAL | Status: AC
  Administered 2017-10-11: 650 mg via ORAL
  Filled 2017-10-11: qty 2

## 2017-10-11 MED ORDER — SERTRALINE HCL 50 MG PO TABS: 50.0000 mg | ORAL_TABLET | Freq: Every day | ORAL | 5 refills | Status: DC

## 2017-10-11 MED ORDER — CIPROFLOXACIN HCL 250 MG PO TABS
250.0000 mg | ORAL_TABLET | Freq: Once | ORAL | Status: AC
  Administered 2017-10-11: 250 mg via ORAL
  Filled 2017-10-11: qty 1

## 2017-10-11 MED ORDER — CIPROFLOXACIN HCL 250 MG PO TABS: 250.0000 mg | ORAL_TABLET | Freq: Every day | ORAL | 0 refills | Status: AC

## 2017-10-11 NOTE — ED Provider Notes (Signed)
[facility] EMERGENCY DEPARTMENT Provider Note   CSN: [id number] Arrival date & time: 10/11/17  0901     History   Chief Complaint Chief Complaint  Patient presents with  . Abdominal Pain    HPI [name] is a 84 y.o. female.  HPI 84 year old Caucasian female past medical history significant for atrial fibrillation currently on Xarelto, hypertension, hyperlipidemia, GERD, remote history of breast cancer, subarachnoid hemorrhage that presents to the emergency department today for evaluation of chest pain.  Patient reports that last night she had some substernal chest pain.  This was not exertional.  Was associated with shortness of breath.  Denies any dyspnea on exertion.  Patient on 2 L of oxygen at baseline at home.  Patient states that the pain lasted throughout the night approximately 5 or 6 hours and then subsided this morning.  Patient currently denies any substernal chest pain.  She does report some pain in her right breast.  She states this is been there for the past 2 weeks and constant with palpation.  Patient states that she fell 2 weeks ago after having what she says is a syncopal episode.  She states she was seen by her primary care doctor for this.  The patient states that since then she is having pain under her right breast. This pain is different the the substernal pain she felt last night. This pain has been constant since she fell 2 weeks ago. Patient reports she is also being treated for a urinary tract infection at this time. She has one more dose of Macrobid.  Patient denies any specific abdominal pain.  Does report urinary symptoms.  Denies any fevers.  Patient reports lower extremity swelling that has improved after being started on Lasix.  Patient states that she has not missed a dose of her Xarelto.  Patient describes the chest pain as a pressure.  Nonradiating.  Not associated nausea, vomiting, diaphoresis.  Patient denies any change in her bowel habits including  melena or hematochezia.  Patient denies any head injury with fall.  Pt denies any fever, chill, ha, vision changes, lightheadedness, dizziness, congestion, neck pain,cough, abd pain, n/v/d,change in bowel habits, melena, hematochezia, lower extremity paresthesias.  Past Medical History:  Diagnosis Date  . Abdominal pain, chronic, right lower quadrant    "ever since I had my appendix removed"  . Atrial fibrillation ([place])   . Breast cancer ([place])   . Cancer ([place])    left side  . Chronic back pain   . Edema   . GERD (gastroesophageal reflux disease)   . Hyperlipidemia   . Hypertension   . Osteopenia   . Osteoporosis   . Ovarian cyst 02/2015   left  . Recurrent abdominal pain    LLQ    Patient Active Problem List   Diagnosis Date Noted  . HLD (hyperlipidemia) 07/18/2017  . B12 deficiency 07/18/2017  . SAH (subarachnoid hemorrhage) ([place]) 07/17/2017  . Hypertensive urgency 07/16/2017  . ICH (intracerebral hemorrhage) ([place]) 07/16/2017  . Cervical disc disease 05/09/2017  . Hypothyroid 05/09/2017  . Major depression in partial remission ([place]) 03/17/2016  . Atrial fibrillation ([place]) 01/12/2016  . Osteoarthritis of right knee 12/15/2015  . Major depression 09/22/2015  . Ventral hernia 05/21/2015  . Ovarian tumor 04/13/2015  . Squamous cell skin cancer 08/13/2014  . Impaired fasting glucose 08/05/2013  . Neuropathy 01/11/2013  . Long term (current) use of anticoagulants 07/27/2012  . Atrial fibrillation with RVR ([place]) 01/29/2012  . Anxiety 01/29/2012  .  GERD (gastroesophageal reflux disease) 01/29/2012  . Chronic anticoagulation 01/29/2012  . Sciatica of right side 05/10/2011    Past Surgical History:  Procedure Laterality Date  . APPENDECTOMY    . APPENDECTOMY    . BREAST SURGERY Left    lumpectomy  . CARDIOVERSION N/A 03/31/2016   Procedure: CARDIOVERSION;  Surgeon: Josue Hector, MD;  Location: [facility];  Service: Cardiovascular;  Laterality: N/A;  . COLON RESECTION      . COLON SURGERY    . HERNIA REPAIR    . HERNIA REPAIR       OB History    Gravida  2   Para  1   Term      Preterm      AB  1   Living  1     SAB  1   TAB      Ectopic      Multiple      Live Births               Home Medications    Prior to Admission medications   Medication Sig Start Date End Date Taking? Authorizing Provider  Acetaminophen (TYLENOL PO) Take 650 mg by mouth every 8 (eight) hours as needed (Pain).     [provider]  ALPRAZolam Duanne Moron) 0.5 MG tablet Take 1 tablet (0.5 mg total) by mouth at bedtime as needed for anxiety or sleep. 08/28/17   [name], MD  amLODipine (NORVASC) 10 MG tablet Take 1 tablet (10 mg total) by mouth daily. 08/30/17   [name], MD  calcium carbonate (TUMS - DOSED IN MG ELEMENTAL CALCIUM) 500 MG chewable tablet Chew 1 tablet by mouth 2 (two) times daily.    [provider]  diltiazem (CARDIZEM CD) 120 MG 24 hr capsule TAKE (1) CAPSULE BY MOUTH ONCE DAILY. 08/30/17   [name], MD  doxycycline (VIBRA-TABS) 100 MG tablet Take 1 tablet (100 mg total) by mouth 2 (two) times daily. Patient not taking: Reported on 09/11/2017 08/30/17   [name], MD  furosemide (LASIX) 20 MG tablet Take 1 tablet (20 mg total) by mouth daily. 08/30/17   [name], MD  hydrALAZINE (APRESOLINE) 25 MG tablet One q 12 hours Patient taking differently: Take 25 mg by mouth 2 (two) times daily.  08/30/17   [name], MD  HYDROcodone-acetaminophen (NORCO/VICODIN) 5-325 MG tablet 1 pill 3 times daily as needed severe pain cautioned drowsiness 10/10/17   Luking, Elayne Snare, MD  levothyroxine (SYNTHROID, LEVOTHROID) 75 MCG tablet Take 1 tablet (75 mcg total) by mouth daily. 08/30/17   [name], MD  metoprolol tartrate (LOPRESSOR) 50 MG tablet Take 1 tablet (50 mg total) by mouth 2 (two) times daily. 08/30/17   [name], MD  Multiple Vitamin (MULTIVITAMIN) tablet Take 1 tablet by mouth daily.     [provider]  nitrofurantoin, macrocrystal-monohydrate, (MACROBID) 100 MG capsule Take 1 capsule (100 mg total) by mouth 2 (two) times daily. 10/05/17   [name], MD  potassium chloride (K-DUR) 10 MEQ tablet Take 1 tablet (10 mEq total) by mouth daily. 08/30/17   [name], MD  potassium chloride (K-DUR,KLOR-CON) 10 MEQ tablet Take two tablets every morning 10/02/17   [name], MD  pravastatin (PRAVACHOL) 20 MG tablet Take 1 tablet (20 mg total) by mouth daily at 6 PM. 09/21/17   Luking, Elayne Snare, MD  rivaroxaban (XARELTO) 20 MG TABS tablet Take  1 tablet (20 mg total) by mouth daily with supper. 08/30/17   [name], MD  sertraline (ZOLOFT) 50 MG tablet Take 1 tablet (50 mg total) by mouth daily. 10/11/17   [name], MD  torsemide (DEMADEX) 20 MG tablet Take 2 tablets every morning. Stop lasix 10/02/17   [name], MD  vitamin B-12 1000 MCG tablet Take 1 tablet (1,000 mcg total) by mouth daily. 07/21/17   Mary Sella, NP    Family History Family History  Problem Relation Age of Onset  . Diabetes Mother   . Heart disease Unknown   . Arthritis Unknown   . Cancer Unknown   . Hyperlipidemia Brother     Social History Social History   Tobacco Use  . Smoking status: Never Smoker  . Smokeless tobacco: Never Used  Substance Use Topics  . Alcohol use: No    Alcohol/week: 0.0 oz  . Drug use: No     Allergies   Amiodarone; Amoxicillin; Cephalosporins; Lipitor [atorvastatin]; Lorazepam; Lovenox [enoxaparin sodium]; Morphine and related; Penicillins; and Sulfa antibiotics   Review of Systems Review of Systems  All other systems reviewed and are negative.    Physical Exam Updated Vital Signs Ht 5\' 4"  (1.626 m)   Wt 59 kg (130 lb)   BMI 22.31 kg/m   Physical Exam  Constitutional: She is oriented to person, place, and time. She appears well-developed and well-nourished.  Non-toxic appearance. No distress.  HENT:  Head: Normocephalic  and atraumatic.  Nose: Nose normal.  Mouth/Throat: Oropharynx is clear and moist.  Eyes: Pupils are equal, round, and reactive to light. Conjunctivae are normal. Right eye exhibits no discharge. Left eye exhibits no discharge.  Neck: Normal range of motion. Neck supple. No JVD present. No tracheal deviation present.  Cardiovascular: Normal rate, regular rhythm, normal heart sounds and intact distal pulses. Exam reveals no gallop and no friction rub.  No murmur heard. Pulmonary/Chest: Effort normal. No stridor. No respiratory distress. She has no wheezes. She exhibits no tenderness.  No hypoxia or tachypnea.  Patient on 2 L of oxygen.  She does have some bilateral lower lobe crackles noted.  No increased work of breathing or retractions.  Abdominal: Soft. Normal appearance and bowel sounds are normal. She exhibits no distension. There is tenderness (states it make her feel like she has to urinate) in the suprapubic area. There is no rebound, no guarding, no CVA tenderness, no tenderness at McBurney's point and negative Murphy's sign.  Patient has no ecchymosis to the abdomen.  Musculoskeletal: Normal range of motion.  Trace pitting edema to the bilateral lower extremities.  There is no obvious erythema noted.  No warmth noted.  Lymphadenopathy:    She has no cervical adenopathy.  Neurological: She is alert and oriented to person, place, and time.  Patient responds to commands appropriately.  Cranial nerves II through XII grossly intact.  Grip strength normal.  Moves all extremities any difficulties.  Skin: Skin is warm and dry. Capillary refill takes less than 2 seconds. She is not diaphoretic.  Psychiatric: Her behavior is normal. Judgment and thought content normal.  Nursing note and vitals reviewed.    ED Treatments / Results  Labs (all labs ordered are listed, but only abnormal results are displayed) Labs Reviewed  URINALYSIS, ROUTINE W REFLEX MICROSCOPIC - Abnormal; Notable for the  following components:      Result Value   APPearance CLOUDY (*)    Protein, ur 30 (*)  Nitrite POSITIVE (*)    Leukocytes, UA LARGE (*)    WBC, UA >50 (*)    Non Squamous Epithelial 0-5 (*)    All other components within normal limits  COMPREHENSIVE METABOLIC PANEL - Abnormal; Notable for the following components:   Potassium 3.3 (*)    Chloride 100 (*)    Glucose, Bld 113 (*)    Creatinine, Ser 1.04 (*)    Alkaline Phosphatase 127 (*)    GFR calc non Af Amer 46 (*)    GFR calc Af Amer 53 (*)    All other components within normal limits  CBC - Abnormal; Notable for the following components:   WBC 11.8 (*)    RBC 3.67 (*)    Hemoglobin 10.2 (*)    HCT 32.1 (*)    All other components within normal limits  BRAIN NATRIURETIC PEPTIDE - Abnormal; Notable for the following components:   B Natriuretic Peptide 199.0 (*)    All other components within normal limits  URINE CULTURE  LIPASE, BLOOD  LACTIC ACID, PLASMA  LACTIC ACID, PLASMA  I-STAT TROPONIN, ED  I-STAT TROPONIN, ED    EKG EKG Interpretation  Date/Time:  Wednesday Oct 11 2017 10:24:07 EDT Ventricular Rate:  65 PR Interval:    QRS Duration: 91 QT Interval:  442 QTC Calculation: 460 R Axis:   -6 Text Interpretation:  Sinus rhythm prolonged PR interval Left axis deviation Consider left ventricular hypertrophy Nonspecific T abnormalities, lateral leads Baseline wander When compared with ECG of 09/29/2017 and 07/16/2017 No significant change was found Confirmed by Francine Graven [id number]) on 10/11/2017 11:59:57 AM   Radiology Dg Chest 2 View  Result Date: 10/11/2017 CLINICAL DATA:  Chest pain EXAM: CHEST - 2 VIEW COMPARISON:  09/29/2017 FINDINGS: Interval improvement in aeration of the lungs. Resolution of congestive heart failure and edema. Improved aeration in the lung bases. Elevated right hemidiaphragm and right lower lobe atelectasis appears chronic. Small pleural effusion on the lateral view. IMPRESSION:  Resolution of pulmonary edema. Improvement in bibasilar atelectasis. Electronically Signed   By: Franchot Gallo M.D.   On: 10/11/2017 11:52    Procedures Procedures (including critical care time)  Medications Ordered in ED Medications  ciprofloxacin (CIPRO) tablet 250 mg (has no administration in time range)  acetaminophen (TYLENOL) tablet 650 mg (650 mg Oral Given 10/11/17 1147)  potassium chloride SA (K-DUR,KLOR-CON) CR tablet 40 mEq (40 mEq Oral Given 10/11/17 1229)     Initial Impression / Assessment and Plan / ED Course  I have reviewed the triage vital signs and the nursing notes.  Pertinent labs & imaging results that were available during my care of the patient were reviewed by me and considered in my medical decision making (see chart for details).     Patient with multiple medical problems presents to the emergency department today for evaluation of substernal chest pain that started last night that self resolved after several hours approximately 8 to 9 hours.  Patient also at this time reports pain in her right breast over her right side of her ribs that is worse with palpation.  She states his pain has been constant for the past 2 weeks after having a fall.  Patient denies any further recent falls.  Patient was seen on 5/10 in the ER for lower extremity swelling.  Patient currently on Xarelto for atrial fibrillation.  She is on 2 L of oxygen at home.  They noted that she had some bilateral edema and some pulmonary  edema.  Patient was given Lasix.  Mild elevation in her BNP.  Restart Lasix at home and was encouraged to follow-up with her primary care.  Patient had echo in 06/2017 that showed EF to be 55 to 60%.  Patient this time denies any substernal chest pain.  She reports pain in her right rib cage from her fall 2 weeks ago.   On exam patient overall well-appearing and nontoxic.  Her vital signs are reassuring.  She is satting at 91% on 2 L of oxygen.  She does wear 2 L oxygen at  baseline.  Patient cannot tell me what she wears this for.  She is afebrile in the ED.  No significant hypertension or hypotension is noted.  Patient has no significant orthostatics.  Heart regular rate and rhythm.  No rubs murmurs or gallops.  Her minimal bibasilar crackles.  Trace pitting edema to the lower extremities.  Patient has no bruising over the abdomen.  She is not tender over the upper abdomen.  Does have some mild suprapubic tenderness that she states "feels like it have to urinate when you press".  Patient has no CVA tenderness.  There is no obvious deformity, ecchymosis of the ribs.  Point tender over the right anterior side of the rib cage under the right breast.   Lab work reveals a mild leukocytosis of 11,000.  Hemoglobin appears improved from baseline.  Negative delta troponin.  Normal lactic acid.  Mild hypokalemia 3.3 which was replaced with oral potassium.  Mildly elevated creatinine.  BNP is 199 and improved from prior 2 weeks ago.  Lipase is normal.  UA shows significant signs of infection.  Culture is pending.  EKG shows sinus rhythm with mildly prolonged PR interval.  She has some nonspecific T wave ab normalities but no acute signs of ischemia.  This appears similar to prior tracing.  Chest x-ray shows improved pulmonary edema.  No new findings noted.  No signs of rib fracture.  Patient continues to have no focal abdominal pain.  Doubt any intra-abdominal pathology or trauma.  Patient's lab work has been reassuring.  Patient treated with Tylenol in the ED.  She has had improvement of the pain in her right side of her ribs.  She does have some mild pain with palpation.  She continues to deny any substernal chest pain.  Denies any shortness of breath, diaphoresis, nausea or emesis at this time.  I suspect possible occult rib fracture.  Patient given incentive spirometer.  Given the reassuring EKG, negative delta troponin low suspicion for ACS however patient will need follow-up  with her primary care doctor possible outpatient work-up.  Patient has no signs of pulmonary edema.  These seem to have improved with her Lasix at home.  Continues to be on 2 L of oxygen at home.  Patient is on Xarelto has not missed a dose of this.  Patient is not hypoxic.  Doubt PE.  Patient has ambulated in the department with saturation above 95%.  Denies any increased work of breathing.  Doubt CHF exacerbation.  Clinical presentation does not seem consistent with dissection.  Abdominal lab work reassuring.  Doubt cholecystitis, bowel obstruction, diverticulitis.  Her urine does show signs of infection.  I did review patient's records and she was placed on Macrobid.  Patient has several allergies.  Urine is significantly infected after a round of Macrobid.  Will start patient on Cipro as discussed with pharmacy and my attending.  Patient is having significant dysuria.  Instructed patient that antibiotics will help with this.  She does have hydrocodone that was prescribed by her primary care doctor for her rib pain.  Pt is hemodynamically stable, in NAD, & able to ambulate in the ED. Evaluation does not show pathology that would require ongoing emergent intervention or inpatient treatment. I explained the diagnosis to the patient. Pain has been managed & has no complaints prior to dc. Pt is comfortable with above plan and is stable for discharge at this time. All questions were answered prior to disposition. Strict return precautions for f/u to the ED were discussed. Encouraged follow up with PCP.  Patient was seen and evaluated with my attending who was agreeable with the above plan.     Final Clinical Impressions(s) / ED Diagnoses   Final diagnoses:  Rib pain on right side  Chest pain, unspecified type  Acute cystitis with hematuria    ED Discharge Orders        Ordered    ciprofloxacin (CIPRO) 250 MG tablet  Daily     10/11/17 1555       Doristine Devoid, PA-C 10/11/17 1618      Francine Graven, DO 10/16/17 0800

## 2017-10-11 NOTE — ED Triage Notes (Addendum)
Ems reports pt c/o epigastric pain since yesterday morning.  Also reports sob.  Pt recently has increased swelling in feet so her lasix was increased and reports has helped the swelling.  Pt also fell 2 weeks ago and has been having pain in r ribs.  Pt reports sob and is on home 02 at 2 liters.  CBG 119.  Pt presently on antibiotics for uti.

## 2017-10-11 NOTE — ED Notes (Signed)
Pt ambulated. Tolerated well. Initial o2 97%. Remained between 97-99% on 2L o2. Pt c/o some sob.

## 2017-10-11 NOTE — Telephone Encounter (Signed)
May have 50 mg tablet, #30, 1 daily, 5 refills

## 2017-10-11 NOTE — Telephone Encounter (Signed)
Rx sent to the pharmacy.

## 2017-10-11 NOTE — ED Notes (Signed)
Incentive spirometer given to pt. Pt instructed on it's use. Teach back method used. Pt verbalized instructions and gave return demonstration.

## 2017-10-11 NOTE — Telephone Encounter (Signed)
Refill request from pharmacy for Sertraline 50mg ; take one tablet by mouth once daily. Last filled 08/30/2017.

## 2017-10-11 NOTE — Discharge Instructions (Signed)
Work-up has been very reassuring in the emergency department.  Your heart test have been completely normal.  The EKG of your heart shows no signs of a heart attack.  I do not see any rib fractures from your fall however you may have a small rib fracture that we are not seeing on the x-ray.  Continue taking the pain medication you have at home.  Your urine is still infected.  Stop taking the Macrobid and start taking this medication.  Have given you a dose today.  Start taking tomorrow for the next 4 days.  You will need to follow-up with your primary care doctor.  If you develop any pain in the center of your chest, shortness of breath, vomiting, sweatiness return the ED immediately.

## 2017-10-13 ENCOUNTER — Telehealth: Payer: Self-pay | Admitting: Family Medicine

## 2017-10-13 NOTE — Telephone Encounter (Signed)
Review blood work results from [facility] in [place].

## 2017-10-14 LAB — URINE CULTURE: Culture: >=100000 — AB

## 2017-10-15 ENCOUNTER — Telehealth: Payer: Self-pay

## 2017-10-15 NOTE — Telephone Encounter (Signed)
Post ED Visit - Positive Culture Follow-up  Culture report reviewed by antimicrobial stewardship pharmacist:  []  Elenor Quinones, Pharm.D. []  Heide Guile, Pharm.D., BCPS AQ-ID []  Parks Neptune, Pharm.D., BCPS []  Alycia Rossetti, Pharm.D., BCPS []  [place], Pharm.D., BCPS, AAHIVP []  Legrand Como, Pharm.D., BCPS, AAHIVP []  Salome Arnt, PharmD, BCPS []  Wynell Balloon, PharmD []  Vincenza Hews, PharmD, BCPS [facility] Pharm D Positive urine culture Treated with ciprofloxacom, organism sensitive to the same and no further patient follow-up is required at this time.  Genia Del 10/15/2017, 11:11 AM

## 2017-10-15 NOTE — Telephone Encounter (Signed)
Lab results look good I have no changes to recommend at this time

## 2017-10-17 ENCOUNTER — Ambulatory Visit: Payer: Medicare Other | Admitting: Family Medicine

## 2017-10-17 ENCOUNTER — Other Ambulatory Visit: Payer: Self-pay | Admitting: Family Medicine

## 2017-10-18 ENCOUNTER — Ambulatory Visit: Payer: Medicare Other | Admitting: Family Medicine

## 2017-10-18 ENCOUNTER — Encounter: Payer: Self-pay | Admitting: Family Medicine

## 2017-10-18 VITALS — BP 126/52 | Ht 64.0 in | Wt 144.0 lb

## 2017-10-18 DIAGNOSIS — I10 Essential (primary) hypertension: Secondary | ICD-10-CM | POA: Diagnosis not present

## 2017-10-18 DIAGNOSIS — I480 Paroxysmal atrial fibrillation: Secondary | ICD-10-CM

## 2017-10-18 NOTE — Progress Notes (Signed)
   Subjective:    Patient ID: [name], female    DOB: 1923/10/08, 84 y.o.   MRN: [id number]  HPI  Patient is here today to follow up on her weakness. Patient feels drain no longer feels low energy.  Feels weak.  States when she moves around she feels tired.  Denies any chest tightness pressure pain shortness of breath wheezing vomiting does have some significant pedal edema Review of Systems Please see above denies PND orthopnea    Objective:   Physical Exam  Lungs are clear respiratory rate normal heart is regular pulse normal patient does have 1-2+ edema in the lower legs  Patient does have atrial fibrillation.  She is on Xarelto.    Assessment & Plan:  Edema is more than likely related to her calcium channel blockers she is on want to help keep the heart rate from going fast the other to keep her blood pressure down  Her blood pressure is relatively low at 118/64 I would recommend stopping hydralazine to see if that helps improve her overall energy.  We will see her back in approximately 4 weeks

## 2017-10-18 NOTE — Telephone Encounter (Signed)
She may have 3 refills of this medicine-please change the directions to be 1/2 tablet or 1 whole tablet nightly as needed insomnia, 0.5 mg tablet, #30

## 2017-10-24 ENCOUNTER — Telehealth: Payer: Self-pay | Admitting: Family Medicine

## 2017-10-24 DIAGNOSIS — J449 Chronic obstructive pulmonary disease, unspecified: Secondary | ICD-10-CM | POA: Diagnosis not present

## 2017-10-24 DIAGNOSIS — I509 Heart failure, unspecified: Secondary | ICD-10-CM | POA: Diagnosis not present

## 2017-10-24 NOTE — Telephone Encounter (Signed)
It would be fine to go ahead and write out a discharge order for oxygen-patient no longer needs oxygen

## 2017-10-24 NOTE — Telephone Encounter (Signed)
[facility] needs discharge order from doctor for them to pick up oxygen.

## 2017-10-24 NOTE — Telephone Encounter (Signed)
Script awaiting signature. Spoke with Felicia at [facility] and script needs to be faxed to [place]

## 2017-10-24 NOTE — Telephone Encounter (Signed)
Faxed script to [facility]

## 2017-10-30 DIAGNOSIS — S066X0A Traumatic subarachnoid hemorrhage without loss of consciousness, initial encounter: Secondary | ICD-10-CM | POA: Diagnosis not present

## 2017-10-30 DIAGNOSIS — J449 Chronic obstructive pulmonary disease, unspecified: Secondary | ICD-10-CM | POA: Diagnosis not present

## 2017-10-30 DIAGNOSIS — I509 Heart failure, unspecified: Secondary | ICD-10-CM | POA: Diagnosis not present

## 2017-11-13 ENCOUNTER — Other Ambulatory Visit: Payer: Self-pay | Admitting: Family Medicine

## 2017-11-14 ENCOUNTER — Ambulatory Visit: Payer: Medicare Other | Admitting: Family Medicine

## 2017-11-14 ENCOUNTER — Encounter: Payer: Self-pay | Admitting: Family Medicine

## 2017-11-14 VITALS — BP 124/64 | HR 63 | Ht 64.0 in | Wt 141.0 lb

## 2017-11-14 DIAGNOSIS — I480 Paroxysmal atrial fibrillation: Secondary | ICD-10-CM | POA: Diagnosis not present

## 2017-11-14 DIAGNOSIS — I10 Essential (primary) hypertension: Secondary | ICD-10-CM

## 2017-11-14 NOTE — Progress Notes (Signed)
   Subjective:    Patient ID: [name], female    DOB: [birth year], 84 y.o.   MRN: [id number]  HPI Patient is here today for a one month follow up.SHe is here to have edema of ankles checked and check her bp as it had been low in the past.She felt her heart was out of rhythm this am. Patient relates fatigue tiredness She finds himself feeling down because she cannot do the things she wants to do She is not suicidal She does not get much social contacts She denies any chest tightness pressure pain shortness of breath currently She does not do much in the way walking   Review of Systems    She relates leg weakness and intermittent swelling in the legs occasional shortness of breath no chest pressure tightness no abdominal pain no nausea vomiting diarrhea no bloody stools no headaches Objective:   Physical Exam Blood pressure acceptable Lungs clear respiratory rate normal heart irregular rate is controlled extremities no edema skin warm dry      Assessment & Plan:  Encouraged patient to bring artwork  Do some walking  Blood pressure atrial fib and chronic CHF under good control currently importance of healthy eating taking her medications doing a lot of walking was discussed follow-up within 3 months

## 2017-11-27 ENCOUNTER — Ambulatory Visit: Payer: Medicare Other | Admitting: Adult Health

## 2017-11-29 DIAGNOSIS — I509 Heart failure, unspecified: Secondary | ICD-10-CM | POA: Diagnosis not present

## 2017-11-29 DIAGNOSIS — J449 Chronic obstructive pulmonary disease, unspecified: Secondary | ICD-10-CM | POA: Diagnosis not present

## 2017-11-29 DIAGNOSIS — S066X0A Traumatic subarachnoid hemorrhage without loss of consciousness, initial encounter: Secondary | ICD-10-CM | POA: Diagnosis not present

## 2017-12-07 ENCOUNTER — Other Ambulatory Visit: Payer: Self-pay | Admitting: Family Medicine

## 2017-12-08 ENCOUNTER — Other Ambulatory Visit: Payer: Self-pay

## 2017-12-08 ENCOUNTER — Other Ambulatory Visit: Payer: Self-pay | Admitting: Family Medicine

## 2017-12-08 MED ORDER — PRAVASTATIN SODIUM 20 MG PO TABS: ORAL_TABLET | ORAL | 1 refills | Status: DC

## 2017-12-30 DIAGNOSIS — I509 Heart failure, unspecified: Secondary | ICD-10-CM | POA: Diagnosis not present

## 2017-12-30 DIAGNOSIS — S066X0A Traumatic subarachnoid hemorrhage without loss of consciousness, initial encounter: Secondary | ICD-10-CM | POA: Diagnosis not present

## 2017-12-30 DIAGNOSIS — J449 Chronic obstructive pulmonary disease, unspecified: Secondary | ICD-10-CM | POA: Diagnosis not present

## 2018-01-08 ENCOUNTER — Other Ambulatory Visit: Payer: Self-pay | Admitting: Cardiology

## 2018-01-30 DIAGNOSIS — S066X0A Traumatic subarachnoid hemorrhage without loss of consciousness, initial encounter: Secondary | ICD-10-CM | POA: Diagnosis not present

## 2018-01-30 DIAGNOSIS — J449 Chronic obstructive pulmonary disease, unspecified: Secondary | ICD-10-CM | POA: Diagnosis not present

## 2018-01-30 DIAGNOSIS — I509 Heart failure, unspecified: Secondary | ICD-10-CM | POA: Diagnosis not present

## 2018-02-13 ENCOUNTER — Other Ambulatory Visit: Payer: Self-pay | Admitting: Family Medicine

## 2018-02-13 ENCOUNTER — Encounter: Payer: Self-pay | Admitting: Family Medicine

## 2018-02-13 ENCOUNTER — Ambulatory Visit (INDEPENDENT_AMBULATORY_CARE_PROVIDER_SITE_OTHER): Payer: Medicare Other | Admitting: Family Medicine

## 2018-02-13 VITALS — BP 130/82 | Ht 64.0 in

## 2018-02-13 DIAGNOSIS — R600 Localized edema: Secondary | ICD-10-CM

## 2018-02-13 DIAGNOSIS — I480 Paroxysmal atrial fibrillation: Secondary | ICD-10-CM

## 2018-02-13 DIAGNOSIS — Z23 Encounter for immunization: Secondary | ICD-10-CM | POA: Diagnosis not present

## 2018-02-13 NOTE — Progress Notes (Signed)
Subjective:    Patient ID: [name], female    DOB: 12/27/1923, 84 y.o.   MRN: [id number]  Hypertension  This is a chronic problem. The current episode started more than 1 year ago. Pertinent negatives include no chest pain or shortness of breath. Risk factors for coronary artery disease include post-menopausal state and sedentary lifestyle. Treatments tried: norvasc, lopressor, cardizem. There are no compliance problems.     Patient stated she is concerned because she has been seein people and a dog in her house that are not really there I expected with her at length she is not depressed.  It is possible her medications may be contributing to her problem.  It is possible metoprolol could be adding to her issues.  I doubt any severe dementia issues but we may need to do other interventions based upon how she does  I talked with her at length about minimizing use of Xanax at nighttime to sleep she should only use this if she has had several nights of poor sleep  Obviously if her hallucinations continue it will need further work-up  Review of Systems  Constitutional: Negative for activity change, appetite change and fatigue.  HENT: Negative for congestion and rhinorrhea.   Respiratory: Negative for cough and shortness of breath.   Cardiovascular: Negative for chest pain and leg swelling.  Gastrointestinal: Negative for abdominal pain and diarrhea.  Endocrine: Negative for polydipsia and polyphagia.  Skin: Negative for color change.  Neurological: Negative for dizziness and weakness.  Psychiatric/Behavioral: Positive for hallucinations. Negative for behavioral problems and confusion.       Objective:   Physical Exam  Constitutional: She appears well-nourished. No distress.  HENT:  Head: Normocephalic and atraumatic.  Eyes: Right eye exhibits no discharge. Left eye exhibits no discharge.  Neck: No tracheal deviation present.  Cardiovascular: Normal rate, regular rhythm and  normal heart sounds.  No murmur heard. Pulmonary/Chest: Effort normal and breath sounds normal. No respiratory distress.  Musculoskeletal: She exhibits edema. She exhibits no deformity.  Lymphadenopathy:    She has no cervical adenopathy.  Neurological: She is alert. Coordination normal.  Skin: Skin is warm and dry.  Psychiatric: She has a normal mood and affect. Her behavior is normal.  Vitals reviewed.    25 minutes was spent with the patient.  This statement verifies that 25 minutes was indeed spent with the patient.  More than 50% of this visit-total duration of the visit-was spent in counseling and coordination of care. The issues that the patient came in for today as reflected in the diagnosis (s) please refer to documentation for further details.      Assessment & Plan:  Blood pressure is relatively low. I recommend she hold off on amlodipine She also has pedal edema that I think will improve with lower medication.  Atrial fibrillation is present.  She she is going to continue her Xarelto.  No bleeding issues.   Patient was encouraged only to use a half of Xanax when necessary to help with sleep do not use frequently  Hallucinations possibly related to age and cognitive issues but is also possible could be due to other measures I recommended the metoprolol just a half a tablet twice daily.  We will repeat the metabolic 7 in several weeks time  The patient will follow-up in several weeks.  If she is not doing better may need to do with a imaging test  I find no evidence of depression currently  I believe  it is important for the patient to continue forward with the medications as listed on her med list with the above corrections

## 2018-02-16 DIAGNOSIS — R300 Dysuria: Secondary | ICD-10-CM | POA: Diagnosis not present

## 2018-03-01 DIAGNOSIS — I509 Heart failure, unspecified: Secondary | ICD-10-CM | POA: Diagnosis not present

## 2018-03-01 DIAGNOSIS — J449 Chronic obstructive pulmonary disease, unspecified: Secondary | ICD-10-CM | POA: Diagnosis not present

## 2018-03-01 DIAGNOSIS — S066X0A Traumatic subarachnoid hemorrhage without loss of consciousness, initial encounter: Secondary | ICD-10-CM | POA: Diagnosis not present

## 2018-03-06 ENCOUNTER — Ambulatory Visit: Payer: Medicare Other | Admitting: Family Medicine

## 2018-03-06 ENCOUNTER — Encounter: Payer: Self-pay | Admitting: Family Medicine

## 2018-03-06 VITALS — BP 124/70 | Ht 64.0 in | Wt 145.0 lb

## 2018-03-06 DIAGNOSIS — I480 Paroxysmal atrial fibrillation: Secondary | ICD-10-CM | POA: Diagnosis not present

## 2018-03-06 DIAGNOSIS — E038 Other specified hypothyroidism: Secondary | ICD-10-CM

## 2018-03-06 DIAGNOSIS — R1084 Generalized abdominal pain: Secondary | ICD-10-CM | POA: Diagnosis not present

## 2018-03-06 DIAGNOSIS — R42 Dizziness and giddiness: Secondary | ICD-10-CM | POA: Diagnosis not present

## 2018-03-06 MED ORDER — METOPROLOL SUCCINATE ER 50 MG PO TB24: 50.0000 mg | ORAL_TABLET | Freq: Every day | ORAL | 1 refills | Status: DC

## 2018-03-06 MED ORDER — LISINOPRIL 2.5 MG PO TABS: 2.5000 mg | ORAL_TABLET | Freq: Every day | ORAL | 1 refills | Status: DC

## 2018-03-06 NOTE — Progress Notes (Signed)
   Subjective:    Patient ID: [name], female    DOB: 07-25-1923, 84 y.o.   MRN: [id number]  HPI  Patient is here today to follow up on edema. She states the swelling is no better. She is taking Torsemide 20 mg two Q am. She has not had any shortness of breath. She states she does not feel that good,feels like her heart is beating in her head, no headache.  Patient presents today having ongoing swelling in the legs in addition to this she overall feels pretty good at bedtime she gets a little dizzy when she stands up in addition to this she denies any chest tightness pressure pain no shortness of breath  She relates she is taking her medications Her healthcare insurance sent a clinical pharmacist to help to work with her on her medicines they are trying to improve her health and cut down on ER visits They sent [place] recommendations I reviewed over these recommendations and agree with some of them  Review of Systems  Constitutional: Negative for activity change, appetite change and fatigue.  HENT: Negative for congestion and rhinorrhea.   Respiratory: Negative for cough and shortness of breath.   Cardiovascular: Negative for chest pain and leg swelling.  Gastrointestinal: Negative for abdominal pain and diarrhea.  Endocrine: Negative for polydipsia and polyphagia.  Skin: Negative for color change.  Neurological: Negative for dizziness and weakness.  Psychiatric/Behavioral: Negative for behavioral problems and confusion.       Objective:   Physical Exam  Constitutional: She appears well-nourished. No distress.  HENT:  Head: Normocephalic and atraumatic.  Eyes: Right eye exhibits no discharge. Left eye exhibits no discharge.  Neck: No tracheal deviation present.  Cardiovascular: Normal rate and normal heart sounds.  No murmur heard. Pulmonary/Chest: Effort normal and breath sounds normal. No respiratory distress.  Musculoskeletal: She exhibits no edema.  Lymphadenopathy:    She  has no cervical adenopathy.  Neurological: She is alert. Coordination normal.  Skin: Skin is warm and dry.  Psychiatric: She has a normal mood and affect. Her behavior is normal.  Vitals reviewed.  abd soft No tenderness       Assessment & Plan:  Atrial fib on anticoagulants continue this  Significant pedal edema related to her medications  stop amlodipine 10 mg Start lisinopril 2.5 mg Consolidate her metoprolol use extended release 50 mg 1 daily  Follow-up again in 2 months  Intermittent abdominal pain once every several weeks we will check lab work her weight is stable I do not recommend any scans or ultrasound at this time we will monitor closely by following up in 2 months  We will also stop pravastatin because of her age unlikely to be benefiting her  Patient agrees with lessening her medication burden

## 2018-03-20 DIAGNOSIS — I480 Paroxysmal atrial fibrillation: Secondary | ICD-10-CM | POA: Diagnosis not present

## 2018-03-20 DIAGNOSIS — R1084 Generalized abdominal pain: Secondary | ICD-10-CM | POA: Diagnosis not present

## 2018-03-20 DIAGNOSIS — E038 Other specified hypothyroidism: Secondary | ICD-10-CM | POA: Diagnosis not present

## 2018-03-20 DIAGNOSIS — R42 Dizziness and giddiness: Secondary | ICD-10-CM | POA: Diagnosis not present

## 2018-03-21 ENCOUNTER — Encounter: Payer: Self-pay | Admitting: Family Medicine

## 2018-03-21 LAB — BASIC METABOLIC PANEL
BUN / CREAT RATIO: 20 (ref 12–28)
BUN: 22 mg/dL (ref 10–36)
CHLORIDE: 100 mmol/L (ref 96–106)
CO2: 25 mmol/L (ref 20–29)
Calcium: 9.3 mg/dL (ref 8.7–10.3)
Creatinine, Ser: 1.1 mg/dL — ABNORMAL HIGH (ref 0.57–1.00)
GFR calc non Af Amer: 44 mL/min/{1.73_m2} — ABNORMAL LOW (ref >59)
GFR, EST AFRICAN AMERICAN: 50 mL/min/{1.73_m2} — AB (ref >59)
Glucose: 80 mg/dL (ref 65–99)
POTASSIUM: 4.3 mmol/L (ref 3.5–5.2)
Sodium: 142 mmol/L (ref 134–144)

## 2018-03-21 LAB — CBC WITH DIFFERENTIAL/PLATELET
BASOS ABS: 0 10*3/uL (ref 0.0–0.2)
Basos: 0 %
EOS (ABSOLUTE): 0.3 10*3/uL (ref 0.0–0.4)
Eos: 3 %
Hematocrit: 34 % (ref 34.0–46.6)
Hemoglobin: 11.3 g/dL (ref 11.1–15.9)
Immature Grans (Abs): 0 10*3/uL (ref 0.0–0.1)
Immature Granulocytes: 0 %
LYMPHS ABS: 2 10*3/uL (ref 0.7–3.1)
Lymphs: 22 %
MCH: 28.8 pg (ref 26.6–33.0)
MCHC: 33.2 g/dL (ref 31.5–35.7)
MCV: 87 fL (ref 79–97)
MONOS ABS: 0.9 10*3/uL (ref 0.1–0.9)
Monocytes: 9 %
NEUTROS ABS: 6.3 10*3/uL (ref 1.4–7.0)
Neutrophils: 66 %
PLATELETS: 266 10*3/uL (ref 150–450)
RBC: 3.93 x10E6/uL (ref 3.77–5.28)
RDW: 12.8 % (ref 12.3–15.4)
WBC: 9.5 10*3/uL (ref 3.4–10.8)

## 2018-03-21 LAB — HEPATIC FUNCTION PANEL
ALT: 12 IU/L (ref 0–32)
AST: 20 IU/L (ref 0–40)
Albumin: 4.1 g/dL (ref 3.2–4.6)
Alkaline Phosphatase: 63 IU/L (ref 39–117)
BILIRUBIN TOTAL: 0.2 mg/dL (ref 0.0–1.2)
BILIRUBIN, DIRECT: 0.08 mg/dL (ref 0.00–0.40)
Total Protein: 6.8 g/dL (ref 6.0–8.5)

## 2018-03-21 LAB — LIPASE: Lipase: 37 U/L (ref 14–85)

## 2018-04-01 DIAGNOSIS — J449 Chronic obstructive pulmonary disease, unspecified: Secondary | ICD-10-CM | POA: Diagnosis not present

## 2018-04-01 DIAGNOSIS — S066X0A Traumatic subarachnoid hemorrhage without loss of consciousness, initial encounter: Secondary | ICD-10-CM | POA: Diagnosis not present

## 2018-04-01 DIAGNOSIS — I509 Heart failure, unspecified: Secondary | ICD-10-CM | POA: Diagnosis not present

## 2018-04-03 ENCOUNTER — Other Ambulatory Visit: Payer: Self-pay | Admitting: Family Medicine

## 2018-04-04 NOTE — Telephone Encounter (Signed)
May have 6 refills 

## 2018-04-18 DIAGNOSIS — N390 Urinary tract infection, site not specified: Secondary | ICD-10-CM | POA: Diagnosis not present

## 2018-04-18 DIAGNOSIS — B9689 Other specified bacterial agents as the cause of diseases classified elsewhere: Secondary | ICD-10-CM | POA: Diagnosis not present

## 2018-05-01 DIAGNOSIS — S066X0A Traumatic subarachnoid hemorrhage without loss of consciousness, initial encounter: Secondary | ICD-10-CM | POA: Diagnosis not present

## 2018-05-01 DIAGNOSIS — J449 Chronic obstructive pulmonary disease, unspecified: Secondary | ICD-10-CM | POA: Diagnosis not present

## 2018-05-01 DIAGNOSIS — I509 Heart failure, unspecified: Secondary | ICD-10-CM | POA: Diagnosis not present

## 2018-05-02 ENCOUNTER — Other Ambulatory Visit: Payer: Self-pay | Admitting: Family Medicine

## 2018-05-02 ENCOUNTER — Telehealth: Payer: Self-pay | Admitting: Family Medicine

## 2018-05-02 MED ORDER — PANTOPRAZOLE SODIUM 40 MG PO TBEC: 40.0000 mg | DELAYED_RELEASE_TABLET | Freq: Every day | ORAL | 5 refills | Status: DC

## 2018-05-02 NOTE — Telephone Encounter (Signed)
Add on to last note:   From 8am-10am this morning (05/02/2018) pt passed 3 bowel movements

## 2018-05-02 NOTE — Telephone Encounter (Signed)
If patient is having black stools she want to get this checked out right away  May send in Protonix 1 daily, #30, 5 refills  We will be happy to see the patient this week if she would like to get checked

## 2018-05-02 NOTE — Telephone Encounter (Signed)
[facility] stated she was discharged from their care 6/19 and they can not send a nurse out to see patient Please advise

## 2018-05-02 NOTE — Telephone Encounter (Signed)
Home health hotline advised to speak with primary care in regards of having dark stools(not red), pt is alert, no fever, no nausea or vomiting, no abd pain nor swelling, from 8am-10am pt passed  Bowel movements, pt is currently taking XARELTO 20 MG TABS tablet , recommendation from home health was to consider starting her on Protonix if possible. Advise.   Pharmacy:  [facility], [place]

## 2018-05-02 NOTE — Telephone Encounter (Signed)
Prescription sent electronically to pharmacy. Patient scheduled appt in am with Dr Nicki Reaper in am and was advised to go to ER if worse.

## 2018-05-02 NOTE — Telephone Encounter (Signed)
Patient has appointment at 110 today we will address it at that time Please go ahead and initiate Protonix 40 mg 1 daily, #30, 5 refills

## 2018-05-03 ENCOUNTER — Ambulatory Visit (INDEPENDENT_AMBULATORY_CARE_PROVIDER_SITE_OTHER): Payer: Medicare Other | Admitting: Family Medicine

## 2018-05-03 ENCOUNTER — Encounter: Payer: Self-pay | Admitting: Family Medicine

## 2018-05-03 VITALS — BP 118/70 | Ht 64.0 in | Wt 139.1 lb

## 2018-05-03 DIAGNOSIS — R197 Diarrhea, unspecified: Secondary | ICD-10-CM | POA: Diagnosis not present

## 2018-05-03 DIAGNOSIS — D649 Anemia, unspecified: Secondary | ICD-10-CM

## 2018-05-03 DIAGNOSIS — R195 Other fecal abnormalities: Secondary | ICD-10-CM | POA: Diagnosis not present

## 2018-05-03 LAB — POCT HEMOGLOBIN: HEMOGLOBIN: 10.6 g/dL (ref 9.5–13.5)

## 2018-05-03 NOTE — Progress Notes (Signed)
   Subjective:    Patient ID: [name], female    DOB: 1923-07-26, 84 y.o.   MRN: [id number]  HPI  Patient is here today with complaints of having dark stools. She says she noticed Tuesday and several times she seen it yesterday. She feels weak.She says she has had some lower side pain,but thinks it is related to arthritis. She has not noticed any bright red blood in her stool. Patient is not certain what was going on but she related some darker stools for several days denies any abdominal pain or discomfort she does relate some low back pain on the left side no fever chills or sweats Review of Systems  Constitutional: Negative for activity change, fatigue and fever.  HENT: Negative for congestion and rhinorrhea.   Respiratory: Negative for cough, chest tightness and shortness of breath.   Cardiovascular: Negative for chest pain and leg swelling.  Gastrointestinal: Negative for abdominal pain and nausea.  Skin: Negative for color change.  Neurological: Negative for dizziness and headaches.  Psychiatric/Behavioral: Negative for agitation and behavioral problems.   Results for orders placed or performed in visit on 05/03/18  POCT hemoglobin  Result Value Ref Range   Hemoglobin 10.6 9.5 - 13.5 g/dL       Objective:   Physical Exam Vitals signs reviewed.  Constitutional:      General: She is not in acute distress. HENT:     Head: Normocephalic and atraumatic.  Eyes:     General:        Right eye: No discharge.        Left eye: No discharge.  Neck:     Trachea: No tracheal deviation.  Cardiovascular:     Rate and Rhythm: Normal rate and regular rhythm.     Heart sounds: Normal heart sounds. No murmur.  Pulmonary:     Effort: Pulmonary effort is normal. No respiratory distress.     Breath sounds: Normal breath sounds.  Abdominal:     Palpations: Abdomen is soft.     Tenderness: There is no abdominal tenderness.  Lymphadenopathy:     Cervical: No cervical adenopathy.    Skin:    General: Skin is warm and dry.  Neurological:     Mental Status: She is alert.     Coordination: Coordination normal.  Psychiatric:        Behavior: Behavior normal.           Assessment & Plan:  Hemoccult is negative Dark stools diarrhea No sign of blood Warning signs were discussed with patient Follow-up ER if black or bloody stools Keep regular follow-ups here will see the patient back in 6 to 8 weeks sooner if any problems

## 2018-05-03 NOTE — Patient Instructions (Addendum)
Your stool test is negative for blood  If any problems please call  We will follow up in 6 to 8 weeks

## 2018-05-08 ENCOUNTER — Ambulatory Visit: Payer: Medicare Other | Admitting: Family Medicine

## 2018-05-10 ENCOUNTER — Telehealth: Payer: Self-pay | Admitting: Family Medicine

## 2018-05-10 DIAGNOSIS — N390 Urinary tract infection, site not specified: Secondary | ICD-10-CM

## 2018-05-10 NOTE — Telephone Encounter (Signed)
Home care provider went to pt's home on 05/08/2018 for a UTI.  [facility] is requesting for a referral to [facility] specialist in [place], [place].

## 2018-05-12 NOTE — Telephone Encounter (Signed)
May have referral to [facility] for frequent UTIs Not really sure what they can offer differently

## 2018-05-14 NOTE — Telephone Encounter (Signed)
Referral put in. Tried to call pt multiple times.  Number would not ring. No number left to call [facility]. Tried to google number and could only find one in [place]. Called son [name] and let him know. He states he does not have [facility] number and that [name] has it. Tried to call [name] again. No answer. Will keep trying to get in touch with pt to get [facility] number

## 2018-05-14 NOTE — Telephone Encounter (Signed)
Pt notified that referral was put in. She could not find [facility] phone number. Told her if they call back we would let them know and she can notify them when they come back out to see her. States they come about once a week.

## 2018-05-22 DIAGNOSIS — R448 Other symptoms and signs involving general sensations and perceptions: Secondary | ICD-10-CM | POA: Diagnosis not present

## 2018-05-22 DIAGNOSIS — H9202 Otalgia, left ear: Secondary | ICD-10-CM | POA: Diagnosis not present

## 2018-05-28 ENCOUNTER — Encounter: Payer: Self-pay | Admitting: Family Medicine

## 2018-05-29 ENCOUNTER — Ambulatory Visit (INDEPENDENT_AMBULATORY_CARE_PROVIDER_SITE_OTHER): Payer: Medicare Other | Admitting: Family Medicine

## 2018-05-29 ENCOUNTER — Encounter: Payer: Self-pay | Admitting: Family Medicine

## 2018-05-29 VITALS — BP 118/68 | Ht 64.0 in | Wt 136.0 lb

## 2018-05-29 DIAGNOSIS — D6489 Other specified anemias: Secondary | ICD-10-CM

## 2018-05-29 DIAGNOSIS — I959 Hypotension, unspecified: Secondary | ICD-10-CM | POA: Diagnosis not present

## 2018-05-29 DIAGNOSIS — R300 Dysuria: Secondary | ICD-10-CM

## 2018-05-29 LAB — POCT URINALYSIS DIPSTICK
Blood, UA: NEGATIVE
pH, UA: 6 (ref 5.0–8.0)

## 2018-05-29 MED ORDER — DOXYCYCLINE HYCLATE 100 MG PO TABS: 100.0000 mg | ORAL_TABLET | Freq: Two times a day (BID) | ORAL | 0 refills | Status: AC

## 2018-05-29 NOTE — Progress Notes (Signed)
   Subjective:    Patient ID: [name], female    DOB: [birth year], 85 y.o.   MRN: [id number]  HPI  Patient is here today to follow up on her dark stools she was having back on May 04, 2019. She states she has not seen any more black stools.   She states she still has a uti, she was given antibiotics that she did not finish as they caused diarrhea. She states she has some burning and stinging while urinating.  She states she can't give a sample of urine as of present. Review of Systems  Constitutional: Positive for fatigue. Negative for activity change and fever.  HENT: Negative for congestion and rhinorrhea.   Respiratory: Negative for cough, chest tightness and shortness of breath.   Cardiovascular: Negative for chest pain and leg swelling.  Gastrointestinal: Negative for abdominal pain and nausea.  Genitourinary: Positive for frequency. Negative for dysuria.  Skin: Negative for color change.  Neurological: Negative for dizziness and headaches.  Psychiatric/Behavioral: Negative for agitation and behavioral problems.       Results for orders placed or performed in visit on 05/29/18  POCT urinalysis dipstick  Result Value Ref Range   Color, UA     Clarity, UA     Glucose, UA     Bilirubin, UA     Ketones, UA     Spec Grav, UA <=1.005 (A) 1.010 - 1.025   Blood, UA Negative    pH, UA 6.0 5.0 - 8.0   Protein, UA     Urobilinogen, UA     Nitrite, UA postivie    Leukocytes, UA Moderate (2+) (A) Negative   Appearance     Odor      Objective:   Physical Exam Vitals signs reviewed.  Constitutional:      General: She is not in acute distress. HENT:     Head: Normocephalic.  Cardiovascular:     Rate and Rhythm: Normal rate and regular rhythm.     Heart sounds: Normal heart sounds. No murmur.  Pulmonary:     Effort: Pulmonary effort is normal.     Breath sounds: Normal breath sounds.  Lymphadenopathy:     Cervical: No cervical adenopathy.  Neurological:   Mental Status: She is alert.  Psychiatric:        Behavior: Behavior normal.           Assessment & Plan:  History of anemia patient feeling fatigue check CBC  Blood pressure lower than what I expected it to be reduced blood pressure medicine stop lisinopril.  Reduce the fluid pill down to 1 tablet daily check metabolic 7  UTI antibiotic prescribed warnings discussed follow-up in 3 weeks as planned  Await the results of the labs

## 2018-05-30 LAB — BASIC METABOLIC PANEL
BUN/Creatinine Ratio: 27 (ref 12–28)
BUN: 32 mg/dL (ref 10–36)
CO2: 24 mmol/L (ref 20–29)
Calcium: 9.5 mg/dL (ref 8.7–10.3)
Chloride: 102 mmol/L (ref 96–106)
Creatinine, Ser: 1.17 mg/dL — ABNORMAL HIGH (ref 0.57–1.00)
GFR, EST AFRICAN AMERICAN: 47 mL/min/{1.73_m2} — AB (ref >59)
GFR, EST NON AFRICAN AMERICAN: 41 mL/min/{1.73_m2} — AB (ref >59)
Glucose: 71 mg/dL (ref 65–99)
POTASSIUM: 4.7 mmol/L (ref 3.5–5.2)
SODIUM: 142 mmol/L (ref 134–144)

## 2018-05-30 LAB — CBC WITH DIFFERENTIAL/PLATELET
BASOS: 0 %
Basophils Absolute: 0 10*3/uL (ref 0.0–0.2)
EOS (ABSOLUTE): 0.2 10*3/uL (ref 0.0–0.4)
Eos: 2 %
HEMATOCRIT: 32.8 % — AB (ref 34.0–46.6)
HEMOGLOBIN: 11 g/dL — AB (ref 11.1–15.9)
IMMATURE GRANS (ABS): 0.1 10*3/uL (ref 0.0–0.1)
IMMATURE GRANULOCYTES: 1 %
LYMPHS: 20 %
Lymphocytes Absolute: 1.8 10*3/uL (ref 0.7–3.1)
MCH: 28.9 pg (ref 26.6–33.0)
MCHC: 33.5 g/dL (ref 31.5–35.7)
MCV: 86 fL (ref 79–97)
MONOCYTES: 8 %
Monocytes Absolute: 0.7 10*3/uL (ref 0.1–0.9)
NEUTROS PCT: 69 %
Neutrophils Absolute: 6.3 10*3/uL (ref 1.4–7.0)
Platelets: 346 10*3/uL (ref 150–450)
RBC: 3.8 x10E6/uL (ref 3.77–5.28)
RDW: 12.8 % (ref 11.7–15.4)
WBC: 9.1 10*3/uL (ref 3.4–10.8)

## 2018-06-01 DIAGNOSIS — I509 Heart failure, unspecified: Secondary | ICD-10-CM | POA: Diagnosis not present

## 2018-06-01 DIAGNOSIS — J449 Chronic obstructive pulmonary disease, unspecified: Secondary | ICD-10-CM | POA: Diagnosis not present

## 2018-06-01 DIAGNOSIS — S066X0A Traumatic subarachnoid hemorrhage without loss of consciousness, initial encounter: Secondary | ICD-10-CM | POA: Diagnosis not present

## 2018-06-01 LAB — SPECIMEN STATUS REPORT

## 2018-06-01 LAB — URINE CULTURE

## 2018-06-19 ENCOUNTER — Ambulatory Visit: Payer: Medicare Other | Admitting: Family Medicine

## 2018-06-19 ENCOUNTER — Encounter: Payer: Self-pay | Admitting: Family Medicine

## 2018-06-19 VITALS — BP 140/72 | Temp 98.5°F | Ht 64.0 in

## 2018-06-19 DIAGNOSIS — E039 Hypothyroidism, unspecified: Secondary | ICD-10-CM | POA: Diagnosis not present

## 2018-06-19 DIAGNOSIS — R350 Frequency of micturition: Secondary | ICD-10-CM

## 2018-06-19 LAB — POCT URINALYSIS DIPSTICK
SPEC GRAV UA: 1.025 (ref 1.010–1.025)
pH, UA: 5 (ref 5.0–8.0)

## 2018-06-19 NOTE — Progress Notes (Signed)
   Subjective:    Patient ID: [name], female    DOB: 07/23/1923, 85 y.o.   MRN: [id number]  HPIFollow up on UTI. Urinary symptoms are better. Has appt with urology March 10th. Pt wants to know if she is better should she still go.  Patient states urinary symptoms are doing better denies any high fever chills sweats dysuria.  Still having bilateral hip pain.  She typically stays in a wheelchair when she comes here to the office but around the house she uses a walker and states her hips bother her but she is tolerating it Tylenol when necessary  States she takes alprazolam to help her sleep.  She uses Xanax sparingly when she cannot sleep this may be occurs once every 7 to 10 days she denies it causing excessive drowsiness.  Results for orders placed or performed in visit on 06/19/18  POCT urinalysis dipstick  Result Value Ref Range   Color, UA     Clarity, UA     Glucose, UA     Bilirubin, UA     Ketones, UA     Spec Grav, UA 1.025 1.010 - 1.025   Blood, UA     pH, UA 5.0 5.0 - 8.0   Protein, UA     Urobilinogen, UA     Nitrite, UA     Leukocytes, UA Moderate (2+) (A) Negative   Appearance     Odor         Review of Systems  Constitutional: Negative for activity change, fatigue and fever.  HENT: Negative for congestion and rhinorrhea.   Respiratory: Negative for cough, chest tightness and shortness of breath.   Cardiovascular: Negative for chest pain and leg swelling.  Gastrointestinal: Negative for abdominal pain and nausea.  Skin: Negative for color change.  Neurological: Negative for dizziness and headaches.  Psychiatric/Behavioral: Negative for agitation and behavioral problems.       Objective:   Physical Exam Vitals signs reviewed.  Constitutional:      General: She is not in acute distress. HENT:     Head: Normocephalic and atraumatic.  Eyes:     General:        Right eye: No discharge.        Left eye: No discharge.  Neck:     Trachea: No tracheal  deviation.  Cardiovascular:     Rate and Rhythm: Normal rate and regular rhythm.     Heart sounds: Normal heart sounds. No murmur.  Pulmonary:     Effort: Pulmonary effort is normal. No respiratory distress.     Breath sounds: Normal breath sounds.  Lymphadenopathy:     Cervical: No cervical adenopathy.  Skin:    General: Skin is warm and dry.  Neurological:     Mental Status: She is alert.     Coordination: Coordination normal.  Psychiatric:        Behavior: Behavior normal.     Urinalysis under the microscope 2-3 WBCs per hpf      Assessment & Plan:  No true urinary tract infection symptoms therefore will not culture urine I think is reasonable for patient to follow-up with urology but currently right now no intervention necessary  Blood pressure good control watch diet stay active  Hip pain osteoarthritis use walker when necessary Tylenol as needed  Hypothyroidism check TSH await the results  Insomnia uses Xanax sparingly because caution with drowsiness  Follow-up in 3 months

## 2018-06-22 ENCOUNTER — Other Ambulatory Visit: Payer: Self-pay

## 2018-06-22 ENCOUNTER — Emergency Department (HOSPITAL_COMMUNITY): Payer: Medicare Other

## 2018-06-22 ENCOUNTER — Emergency Department (HOSPITAL_COMMUNITY)
Admission: EM | Admit: 2018-06-22 | Discharge: 2018-06-23 | Disposition: A | Discharge status: Home / Self Care | Payer: Medicare Other | Attending: Emergency Medicine | Admitting: Emergency Medicine

## 2018-06-22 ENCOUNTER — Ambulatory Visit (INDEPENDENT_AMBULATORY_CARE_PROVIDER_SITE_OTHER): Payer: Medicare Other | Admitting: Family Medicine

## 2018-06-22 ENCOUNTER — Encounter (HOSPITAL_COMMUNITY): Payer: Self-pay

## 2018-06-22 ENCOUNTER — Encounter: Payer: Self-pay | Admitting: Family Medicine

## 2018-06-22 VITALS — BP 162/88 | Temp 99.7°F | Ht 64.0 in

## 2018-06-22 DIAGNOSIS — R109 Unspecified abdominal pain: Secondary | ICD-10-CM | POA: Diagnosis not present

## 2018-06-22 DIAGNOSIS — Z79899 Other long term (current) drug therapy: Secondary | ICD-10-CM | POA: Diagnosis not present

## 2018-06-22 DIAGNOSIS — R112 Nausea with vomiting, unspecified: Secondary | ICD-10-CM | POA: Diagnosis not present

## 2018-06-22 DIAGNOSIS — E039 Hypothyroidism, unspecified: Secondary | ICD-10-CM | POA: Diagnosis not present

## 2018-06-22 DIAGNOSIS — K5730 Diverticulosis of large intestine without perforation or abscess without bleeding: Secondary | ICD-10-CM | POA: Diagnosis not present

## 2018-06-22 DIAGNOSIS — Z853 Personal history of malignant neoplasm of breast: Secondary | ICD-10-CM | POA: Insufficient documentation

## 2018-06-22 DIAGNOSIS — R1031 Right lower quadrant pain: Secondary | ICD-10-CM | POA: Insufficient documentation

## 2018-06-22 DIAGNOSIS — R1030 Lower abdominal pain, unspecified: Secondary | ICD-10-CM

## 2018-06-22 DIAGNOSIS — R1110 Vomiting, unspecified: Secondary | ICD-10-CM | POA: Diagnosis present

## 2018-06-22 LAB — COMPREHENSIVE METABOLIC PANEL
ALT: 15 U/L (ref 0–44)
AST: 24 U/L (ref 15–41)
Albumin: 4.4 g/dL (ref 3.5–5.0)
Alkaline Phosphatase: 59 U/L (ref 38–126)
Anion gap: 12 (ref 5–15)
BUN: 26 mg/dL — ABNORMAL HIGH (ref 8–23)
CALCIUM: 9.9 mg/dL (ref 8.9–10.3)
CO2: 27 mmol/L (ref 22–32)
CREATININE: 1.29 mg/dL — AB (ref 0.44–1.00)
Chloride: 102 mmol/L (ref 98–111)
GFR calc non Af Amer: 36 mL/min — ABNORMAL LOW (ref >60)
GFR, EST AFRICAN AMERICAN: 42 mL/min — AB (ref >60)
Glucose, Bld: 125 mg/dL — ABNORMAL HIGH (ref 70–99)
Potassium: 3.6 mmol/L (ref 3.5–5.1)
Sodium: 141 mmol/L (ref 135–145)
Total Bilirubin: 0.7 mg/dL (ref 0.3–1.2)
Total Protein: 7.7 g/dL (ref 6.5–8.1)

## 2018-06-22 LAB — URINALYSIS, ROUTINE W REFLEX MICROSCOPIC
Bacteria, UA: NONE SEEN
Bilirubin Urine: NEGATIVE
GLUCOSE, UA: NEGATIVE mg/dL
Ketones, ur: NEGATIVE mg/dL
Nitrite: NEGATIVE
Protein, ur: NEGATIVE mg/dL
Specific Gravity, Urine: 1.006 (ref 1.005–1.030)
pH: 6 (ref 5.0–8.0)

## 2018-06-22 LAB — CBC
HCT: 40 % (ref 36.0–46.0)
Hemoglobin: 12.9 g/dL (ref 12.0–15.0)
MCH: 28.9 pg (ref 26.0–34.0)
MCHC: 32.3 g/dL (ref 30.0–36.0)
MCV: 89.7 fL (ref 80.0–100.0)
Platelets: 307 10*3/uL (ref 150–400)
RBC: 4.46 MIL/uL (ref 3.87–5.11)
RDW: 12.6 % (ref 11.5–15.5)
WBC: 12.9 10*3/uL — ABNORMAL HIGH (ref 4.0–10.5)
nRBC: 0 % (ref 0.0–0.2)

## 2018-06-22 LAB — LIPASE, BLOOD: Lipase: 31 U/L (ref 11–51)

## 2018-06-22 MED ORDER — SODIUM CHLORIDE 0.9% FLUSH: 3.0000 mL | Freq: Once | INTRAVENOUS | Status: DC

## 2018-06-22 MED ORDER — SODIUM CHLORIDE 0.9 % IV BOLUS
500.0000 mL | Freq: Once | INTRAVENOUS | Status: AC
  Administered 2018-06-22: 500 mL via INTRAVENOUS

## 2018-06-22 MED ORDER — IOHEXOL 300 MG/ML  SOLN
25.0000 mL | Freq: Once | INTRAMUSCULAR | Status: AC | PRN
  Administered 2018-06-22: 25 mL via ORAL

## 2018-06-22 MED ORDER — ONDANSETRON 4 MG PO TBDP: 4.0000 mg | ORAL_TABLET | Freq: Three times a day (TID) | ORAL | 0 refills | Status: DC | PRN

## 2018-06-22 NOTE — ED Triage Notes (Signed)
Dr. Wolfgang Phoenix stated pt has right sided tenderness. Thinks pt might have Diverticulitis. Threw up twice today. NAD.

## 2018-06-22 NOTE — ED Provider Notes (Signed)
[facility] EMERGENCY DEPARTMENT Provider Note   CSN: [id number] Arrival date & time: 06/22/18  1653     History   Chief Complaint Chief Complaint  Patient presents with  . Emesis    HPI [name] is a 85 y.o. female.  HPI Patient presents with abdominal pain nausea and vomiting.  Began earlier today.  States she has had some chills.  Saw her PCP Dr. Wolfgang Phoenix and was sent to the ER.  States she vomited and felt a little better after vomiting her belly became less firm.  Has had previous abdominal surgeries.  No dysuria.  Still passing gas.  Pain is crampy and severe. Past Medical History:  Diagnosis Date  . Abdominal pain, chronic, right lower quadrant    "ever since I had my appendix removed"  . Atrial fibrillation ([place])   . Breast cancer ([place])   . Cancer ([place])    left side  . Chronic back pain   . Edema   . GERD (gastroesophageal reflux disease)   . Hyperlipidemia   . Hypertension   . Osteopenia   . Osteoporosis   . Ovarian cyst 02/2015   left  . Recurrent abdominal pain    LLQ    Patient Active Problem List   Diagnosis Date Noted  . HLD (hyperlipidemia) 07/18/2017  . B12 deficiency 07/18/2017  . SAH (subarachnoid hemorrhage) ([place]) 07/17/2017  . Hypertensive urgency 07/16/2017  . ICH (intracerebral hemorrhage) ([place]) 07/16/2017  . Cervical disc disease 05/09/2017  . Hypothyroid 05/09/2017  . Major depression in partial remission ([place]) 03/17/2016  . Atrial fibrillation ([place]) 01/12/2016  . Osteoarthritis of right knee 12/15/2015  . Major depression 09/22/2015  . Ventral hernia 05/21/2015  . Ovarian tumor 04/13/2015  . Squamous cell skin cancer 08/13/2014  . Impaired fasting glucose 08/05/2013  . Neuropathy 01/11/2013  . Long term (current) use of anticoagulants 07/27/2012  . Atrial fibrillation with RVR ([place]) 01/29/2012  . Anxiety 01/29/2012  . GERD (gastroesophageal reflux disease) 01/29/2012  . Chronic anticoagulation 01/29/2012  . Sciatica of  right side 05/10/2011    Past Surgical History:  Procedure Laterality Date  . APPENDECTOMY    . APPENDECTOMY    . BREAST SURGERY Left    lumpectomy  . CARDIOVERSION N/A 03/31/2016   Procedure: CARDIOVERSION;  Surgeon: Josue Hector, MD;  Location: [facility];  Service: Cardiovascular;  Laterality: N/A;  . COLON RESECTION    . COLON SURGERY    . HERNIA REPAIR    . HERNIA REPAIR       OB History    Gravida  2   Para  1   Term      Preterm      AB  1   Living  1     SAB  1   TAB      Ectopic      Multiple      Live Births               Home Medications    Prior to Admission medications   Medication Sig Start Date End Date Taking? Authorizing Provider  Acetaminophen (TYLENOL PO) Take 650 mg by mouth every 8 (eight) hours as needed (Pain).     [provider]  ALPRAZolam Duanne Moron) 0.5 MG tablet Take 1/2-1 tablet nightly as needed for insomnia 10/19/17   [name], MD  calcium carbonate (TUMS - DOSED IN MG ELEMENTAL CALCIUM) 500 MG chewable tablet Chew 1 tablet by mouth 2 (  two) times daily.    [provider]  diltiazem (CARDIZEM CD) 120 MG 24 hr capsule TAKE (1) CAPSULE BY MOUTH ONCE DAILY. 08/30/17   [name], MD  levothyroxine (SYNTHROID, LEVOTHROID) 75 MCG tablet TAKE ONE TABLET BY MOUTH ONCE DAILY. 02/13/18   [name], MD  lisinopril (ZESTRIL) 2.5 MG tablet Take 1 tablet (2.5 mg total) by mouth daily. 03/06/18   [name], MD  metoprolol succinate (TOPROL-XL) 50 MG 24 hr tablet Take 1 tablet (50 mg total) by mouth daily. Take with or immediately following a meal. 03/06/18   [name], MD  Multiple Vitamin (MULTIVITAMIN) tablet Take 1 tablet by mouth daily.    [provider]  ondansetron (ZOFRAN-ODT) 4 MG disintegrating tablet Take 1 tablet (4 mg total) by mouth every 8 (eight) hours as needed for nausea or vomiting. 06/22/18   Davonna Belling, MD  pantoprazole (PROTONIX) 40 MG tablet Take 1 tablet (40 mg  total) by mouth daily. 05/02/18 05/02/19  [name], MD  potassium chloride (K-DUR) 10 MEQ tablet TAKE ONE TABLET BY MOUTH ONCE DAILY. 05/02/18   [name], MD  sertraline (ZOLOFT) 50 MG tablet TAKE 1 TABLET BY MOUTH ONCE A DAY. 04/04/18   [name], MD  torsemide (DEMADEX) 20 MG tablet TAKE 2 TABLETS DAILY EVERY MORNING. 05/02/18   [name], MD  vitamin B-12 1000 MCG tablet Take 1 tablet (1,000 mcg total) by mouth daily. 07/21/17   Mary Sella, NP  XARELTO 20 MG TABS tablet TAKE 1 TABLET BY MOUTH ONCE DAILY WITH SUPPER. 02/13/18   [name], MD    Family History Family History  Problem Relation Age of Onset  . Diabetes Mother   . Heart disease Other   . Arthritis Other   . Cancer Other   . Hyperlipidemia Brother     Social History Social History   Tobacco Use  . Smoking status: Never Smoker  . Smokeless tobacco: Never Used  Substance Use Topics  . Alcohol use: No    Alcohol/week: 0.0 standard drinks  . Drug use: No     Allergies   Amiodarone; Amoxicillin; Cephalosporins; Lipitor [atorvastatin]; Lorazepam; Lovenox [enoxaparin sodium]; Morphine and related; Penicillins; and Sulfa antibiotics   Review of Systems Review of Systems  Constitutional: Positive for appetite change. Negative for chills.  HENT: Negative for congestion.   Respiratory: Negative for shortness of breath.   Cardiovascular: Negative for chest pain.  Gastrointestinal: Positive for abdominal distention, abdominal pain, nausea and vomiting.  Genitourinary: Negative for dysuria.  Musculoskeletal: Negative for back pain.  Skin: Negative for rash.  Neurological: Negative for weakness.  Hematological: Negative for adenopathy.  Psychiatric/Behavioral: Negative for confusion.     Physical Exam Updated Vital Signs BP (!) 150/86   Pulse 89   Temp 99.1 F (37.3 C) (Oral)   Resp 16   Ht 5\' 4"  (1.626 m)   Wt 60.8 kg   SpO2 94%   BMI 23.00 kg/m   Physical Exam HENT:       Head: Normocephalic.     Mouth/Throat:     Mouth: Mucous membranes are moist.  Eyes:     Extraocular Movements: Extraocular movements intact.  Neck:     Musculoskeletal: Neck supple.  Cardiovascular:     Rate and Rhythm: Regular rhythm.  Abdominal:     Tenderness: There is abdominal tenderness.     Comments: Tenderness over lower abdomen most severely but also somewhat  diffusely.  No clear hernia palpated.  Some firmness of abdomen.  Musculoskeletal:        General: No tenderness.  Skin:    General: Skin is warm.     Capillary Refill: Capillary refill takes less than 2 seconds.  Neurological:     Mental Status: She is alert and oriented to person, place, and time.      ED Treatments / Results  Labs (all labs ordered are listed, but only abnormal results are displayed) Labs Reviewed  COMPREHENSIVE METABOLIC PANEL - Abnormal; Notable for the following components:      Result Value   Glucose, Bld 125 (*)    BUN 26 (*)    Creatinine, Ser 1.29 (*)    GFR calc non Af Amer 36 (*)    GFR calc Af Amer 42 (*)    All other components within normal limits  CBC - Abnormal; Notable for the following components:   WBC 12.9 (*)    All other components within normal limits  LIPASE, BLOOD  URINALYSIS, ROUTINE W REFLEX MICROSCOPIC    EKG None  Radiology Ct Abdomen Pelvis Wo Contrast  Result Date: 06/22/2018 CLINICAL DATA:  Abdominal pain with nausea and vomiting. EXAM: CT ABDOMEN AND PELVIS WITHOUT CONTRAST TECHNIQUE: Multidetector CT imaging of the abdomen and pelvis was performed following the standard protocol without IV contrast. COMPARISON:  01/25/2016 FINDINGS: Lower chest: Coronary arteriosclerosis. The included heart size is top-normal. No pericardial effusion or thickening. Small hiatal hernia. Hepatobiliary: No focal liver abnormality is seen. No gallstones, gallbladder wall thickening, or biliary dilatation. Pancreas: Unremarkable. No pancreatic ductal dilatation or  surrounding inflammatory changes. Spleen: Normal in size without focal abnormality. Adrenals/Urinary Tract: Adrenal glands are unremarkable. Kidneys are normal, without renal calculi, focal lesion, or hydronephrosis. Bladder is unremarkable. Stomach/Bowel: Small hiatal hernia. Decompressed stomach. Duodenal diverticulum off the third portion. Normal small bowel rotation and ligament of Treitz position. Contrast reaches the colon. Status post appendectomy. Moderate stool retention and colonic diverticulosis noted from mid descending colon through rectum. Vascular/Lymphatic: Moderate aortoiliac and branch vessel atherosclerosis. No adenopathy. Reproductive: Uterus and bilateral adnexa are unremarkable. Other: Midline ventral scarring and and soft tissue calcifications near the site of incision projecting over the lower pelvis. No free air or free fluid. Musculoskeletal: Thoracolumbar spondylosis with facet arthropathy. No aggressive osseous lesions or fracture. Grade 1 anterolisthesis of L4 on L5 likely on the basis of degenerative disc disease. IMPRESSION: 1. No acute intraabdominal or pelvic abormality. 2. Moderate stool retention within the left colon with colonic diverticulosis. No acute diverticulitis. 3. Thoracolumbar spondylosis with grade 1 anterolisthesis of L4 on L5 likely on the basis of degenerative disc disease. Aortic Atherosclerosis (ICD10-I70.0). Electronically Signed   By: Ashley Royalty M.D.   On: 06/22/2018 23:05    Procedures Procedures (including critical care time)  Medications Ordered in ED Medications  sodium chloride flush (NS) 0.9 % injection 3 mL (3 mLs Intravenous Not Given 06/22/18 2010)  sodium chloride 0.9 % bolus 500 mL (0 mLs Intravenous Stopped 06/22/18 2128)  iohexol (OMNIPAQUE) 300 MG/ML solution 25 mL (25 mLs Oral Contrast Given 06/22/18 2246)     Initial Impression / Assessment and Plan / ED Course  I have reviewed the triage vital signs and the nursing  notes.  Pertinent labs & imaging results that were available during my care of the patient were reviewed by me and considered in my medical decision making (see chart for details).     Patient with right-sided abdominal  pain.  Sent in by PCP.  White count mildly elevated as is creatinine.  CT scan done and however reassuring.  Urine still pending.  Patient states she is feeling much better.  Hopefully will be able to discharge home.  Care will be turned over to Dr. Tomi Bamberger.  Final Clinical Impressions(s) / ED Diagnoses   Final diagnoses:  Non-intractable vomiting with nausea, unspecified vomiting type  Abdominal pain, unspecified abdominal location    ED Discharge Orders         Ordered    ondansetron (ZOFRAN-ODT) 4 MG disintegrating tablet  Every 8 hours PRN     06/22/18 2321           Davonna Belling, MD 06/22/18 2321

## 2018-06-22 NOTE — Progress Notes (Signed)
   Subjective:    Patient ID: [name], female    DOB: [birth year], 85 y.o.   MRN: [id number]  HPI  Patient is here today with complaints of waking up this am with stomach pain and vomiting. Patient relates some abdominal pain earlier today is been pretty severe at times other times it seems to fade she has had multiple different episodes of vomiting no diarrhea no sweats or chills. She also states her bp was elevated this am. She has taken the medications today,but states she vomited it back up.  Review of Systems  Constitutional: Negative for activity change and appetite change.  HENT: Negative for congestion and rhinorrhea.   Respiratory: Negative for cough and shortness of breath.   Cardiovascular: Negative for chest pain and leg swelling.  Gastrointestinal: Positive for abdominal pain, nausea and vomiting.  Skin: Negative for color change.  Neurological: Negative for dizziness and weakness.  Psychiatric/Behavioral: Negative for agitation and confusion.       Objective:   Physical Exam Vitals signs reviewed.  Constitutional:      General: She is not in acute distress. HENT:     Head: Normocephalic.  Cardiovascular:     Rate and Rhythm: Normal rate. Rhythm irregular.     Heart sounds: Normal heart sounds. No murmur.  Pulmonary:     Effort: Pulmonary effort is normal.     Breath sounds: Normal breath sounds.  Abdominal:     Palpations: Abdomen is soft. There is no mass.     Tenderness: There is abdominal tenderness. There is no guarding or rebound.  Lymphadenopathy:     Cervical: No cervical adenopathy.  Neurological:     Mental Status: She is alert.  Psychiatric:        Behavior: Behavior normal.   She is tender in the lower abdomen left side    25 minutes was spent with the patient.  This statement verifies that 25 minutes was indeed spent with the patient.  More than 50% of this visit-total duration of the visit-was spent in counseling and coordination of  care. The issues that the patient came in for today as reflected in the diagnosis (s) please refer to documentation for further details.     Assessment & Plan:  Abdominal pain Possible partial small bowel obstruction versus diverticulitis Given the pain in the vomiting I recommend ER evaluation immediately ER was spoken with they will be receiving her

## 2018-06-23 NOTE — ED Notes (Signed)
Pt states she is tired of waiting for her results and is not waiting anymore. Pt's son took her out in wheelchair. I told her to followup with Dr Wolfgang Phoenix.

## 2018-06-26 ENCOUNTER — Other Ambulatory Visit: Payer: Self-pay | Admitting: Family Medicine

## 2018-06-26 DIAGNOSIS — E039 Hypothyroidism, unspecified: Secondary | ICD-10-CM | POA: Diagnosis not present

## 2018-06-27 LAB — TSH: TSH: 1.44 u[IU]/mL (ref 0.450–4.500)

## 2018-06-27 NOTE — Telephone Encounter (Signed)
Please try to find out from family as patient currently taking this?  Or possibly not?  Current standards state that we try to avoid this medication in someone of her age. If she is taking this dose our next step will be to reduce the dose then eventually stop the medicine if she is not using the medicine I recommend deleting it from her medicine list thank you

## 2018-06-28 ENCOUNTER — Other Ambulatory Visit: Payer: Self-pay

## 2018-06-28 MED ORDER — ALPRAZOLAM 0.25 MG PO TABS: ORAL_TABLET | ORAL | 0 refills | Status: DC

## 2018-06-28 NOTE — Telephone Encounter (Signed)
So at this point I recommend stopping 0.5 mg New dose 0.25 mg 1 twice daily as needed anxiety caution drowsiness use sparingly #20

## 2018-06-28 NOTE — Telephone Encounter (Signed)
[name] states she rarely takes xanax and when she does take one she splits it in half

## 2018-07-02 ENCOUNTER — Telehealth: Payer: Self-pay | Admitting: Family Medicine

## 2018-07-02 ENCOUNTER — Other Ambulatory Visit: Payer: Self-pay

## 2018-07-02 MED ORDER — ALPRAZOLAM 0.25 MG PO TABS: ORAL_TABLET | ORAL | 2 refills | Status: DC

## 2018-07-02 NOTE — Telephone Encounter (Signed)
Please advise. Thank you

## 2018-07-02 NOTE — Telephone Encounter (Signed)
CB# [phone]

## 2018-07-02 NOTE — Telephone Encounter (Signed)
PA with [facility] calling requesting pt get refill on ALPRAZolam (XANAX) 0.25 MG tablet. Please send to [place], [place]

## 2018-07-02 NOTE — Telephone Encounter (Signed)
May have 2 refills sent to local pharmacy as requested

## 2018-07-17 ENCOUNTER — Other Ambulatory Visit: Payer: Self-pay | Admitting: Family Medicine

## 2018-07-18 NOTE — Telephone Encounter (Signed)
May have this +1 refill 

## 2018-07-26 ENCOUNTER — Other Ambulatory Visit: Payer: Self-pay | Admitting: Family Medicine

## 2018-07-31 ENCOUNTER — Ambulatory Visit: Payer: Medicare Other | Admitting: Urology

## 2018-07-31 DIAGNOSIS — N3000 Acute cystitis without hematuria: Secondary | ICD-10-CM | POA: Diagnosis not present

## 2018-07-31 DIAGNOSIS — R351 Nocturia: Secondary | ICD-10-CM

## 2018-08-20 ENCOUNTER — Other Ambulatory Visit: Payer: Self-pay

## 2018-08-20 MED ORDER — LISINOPRIL 2.5 MG PO TABS: 2.5000 mg | ORAL_TABLET | Freq: Every day | ORAL | 1 refills | Status: DC

## 2018-09-14 ENCOUNTER — Other Ambulatory Visit: Payer: Self-pay

## 2018-09-14 MED ORDER — LISINOPRIL 2.5 MG PO TABS: 2.5000 mg | ORAL_TABLET | Freq: Every day | ORAL | 1 refills | Status: DC

## 2018-09-17 ENCOUNTER — Other Ambulatory Visit: Payer: Self-pay | Admitting: Cardiology

## 2018-09-17 ENCOUNTER — Other Ambulatory Visit: Payer: Self-pay | Admitting: Family Medicine

## 2018-09-18 ENCOUNTER — Ambulatory Visit: Payer: Medicare Other | Admitting: Family Medicine

## 2018-10-16 ENCOUNTER — Telehealth: Payer: Self-pay | Admitting: Family Medicine

## 2018-10-16 NOTE — Telephone Encounter (Signed)
Patient scheduled phone visit with Dr Nicki Reaper 10/17/18 to discuss pain medication.

## 2018-10-16 NOTE — Telephone Encounter (Signed)
Please schedule as a virtual visit for Tuesday Being a controlled medication that can cause drowsiness I would like to have discussion with the patient either by phone or video tomorrow Phone is permissible if patient unable to do the video portion

## 2018-10-16 NOTE — Telephone Encounter (Signed)
Pt states she's in a lot of pain - feet, knees, shoulders - states Tylenol is not helping  Requesting HYDROcodone-acetaminophen (NORCO/VICODIN) 5-325 MG per tablet   States she's taken in the past & it works well  Please advise & call pt    [employer]

## 2018-10-17 ENCOUNTER — Other Ambulatory Visit: Payer: Self-pay

## 2018-10-17 ENCOUNTER — Ambulatory Visit (INDEPENDENT_AMBULATORY_CARE_PROVIDER_SITE_OTHER): Payer: Medicare Other | Admitting: Family Medicine

## 2018-10-17 DIAGNOSIS — M170 Bilateral primary osteoarthritis of knee: Secondary | ICD-10-CM

## 2018-10-17 MED ORDER — HYDROCODONE-ACETAMINOPHEN 5-325 MG PO TABS: ORAL_TABLET | ORAL | 0 refills | Status: DC

## 2018-10-17 NOTE — Progress Notes (Signed)
   Subjective:    Patient ID: [name], female    DOB: Sep 09, 1923, 85 y.o.   MRN: [id number]  Knee Pain   The incident occurred more than 1 week ago. Pain location: Both legs, knees, feet and ankles. Right shouler. Exacerbated by: laying down at night, pt begins to hurt and is unable to sleep. She has tried acetaminophen for the symptoms. The treatment provided no relief.  pt states that she would like Hydrocodone called in. Pt states she has been on this in the past and did well. Pt states she took a half tablet at night and it helped her to get sleep. She complains of knee pain as well as back pain and joint pain makes it difficult for her to get comfortable in the evening she used to take a half a tablet pain medicine when necessary I discussed this with her I told her about overusing the medicine would not be good and could cause side effects we will go with a low-dose for her. Virtual Visit via Video Note  I connected with [name] on 10/17/18 at  2:00 PM EDT by a video enabled telemedicine application and verified that I am speaking with the correct person using two identifiers.  Location: Patient: home Provider: office   I discussed the limitations of evaluation and management by telemedicine and the availability of in person appointments. The patient expressed understanding and agreed to proceed.  History of Present Illness:    Observations/Objective:   Assessment and Plan:   Follow Up Instructions:    I discussed the assessment and treatment plan with the patient. The patient was provided an opportunity to ask questions and all were answered. The patient agreed with the plan and demonstrated an understanding of the instructions.   The patient was advised to call back or seek an in-person evaluation if the symptoms worsen or if the condition fails to improve as anticipated.  I provided 15 minutes of non-face-to-face time during this encounter.   Vicente Males,  LPN     Review of Systems  Constitutional: Negative for activity change and appetite change.  HENT: Negative for congestion and rhinorrhea.   Respiratory: Negative for cough and shortness of breath.   Cardiovascular: Negative for chest pain and leg swelling.  Gastrointestinal: Negative for abdominal pain, nausea and vomiting.  Musculoskeletal: Positive for arthralgias and back pain.  Skin: Negative for color change.  Neurological: Negative for dizziness and weakness.  Psychiatric/Behavioral: Negative for agitation and confusion.       Objective:   Physical Exam  Today's visit was via telephone Physical exam was not possible for this visit       Assessment & Plan:  Chronic back and hip pain In medication she uses sparingly Drug registry checked Some was sent in she will take typically 1/2 tablet in the evening as needed to help her with pain she will avoid overusing the medicine she will follow-up if any progressive troubles The patient was encouraged just using medication in the evening if at all possible We will do comprehensive visit later in the summer

## 2018-10-18 ENCOUNTER — Other Ambulatory Visit: Payer: Self-pay | Admitting: Family Medicine

## 2018-10-18 NOTE — Telephone Encounter (Signed)
May have a refill on all of these with 3 additional refills

## 2018-11-20 ENCOUNTER — Other Ambulatory Visit: Payer: Self-pay | Admitting: Family Medicine

## 2018-11-28 ENCOUNTER — Telehealth: Payer: Self-pay | Admitting: Family Medicine

## 2018-11-28 NOTE — Telephone Encounter (Signed)
Patient scheduled virtual visit with Dr Nicki Reaper 11/29/18

## 2018-11-28 NOTE — Telephone Encounter (Signed)
Schedule phone visit for thursday

## 2018-11-28 NOTE — Telephone Encounter (Signed)
[name] with [facility] calling stating pt is more depressed lately and would like to know if her medication can be adjusted .   CB# [phone]

## 2018-11-29 ENCOUNTER — Other Ambulatory Visit: Payer: Self-pay

## 2018-11-29 ENCOUNTER — Ambulatory Visit (INDEPENDENT_AMBULATORY_CARE_PROVIDER_SITE_OTHER): Payer: Medicare Other | Admitting: Family Medicine

## 2018-11-29 DIAGNOSIS — F320 Major depressive disorder, single episode, mild: Secondary | ICD-10-CM

## 2018-11-29 NOTE — Progress Notes (Signed)
   Subjective:    Patient ID: [name], female    DOB: March 17, 1924, 85 y.o.   MRN: [id number] Telephone only Depression        This is a chronic problem.  Associated symptoms include no appetite change. pt states that she has had depression since she was in the nursing home. Pt states that she has not been able to go anywhere or do anything. Pt states sometimes she sleeps well. Sometimes she does not. I talked at length with the patient she feels a lot of this is stemming from the fact of the coronavirus not being able to go anywhere she does not want to increase the dose of her medicine Virtual Visit via Video Note  I connected with [name] on 11/29/18 at 11:30 AM EDT by a video enabled telemedicine application and verified that I am speaking with the correct person using two identifiers.  Location: Patient: home Provider: office   I discussed the limitations of evaluation and management by telemedicine and the availability of in person appointments. The patient expressed understanding and agreed to proceed.  History of Present Illness:    Observations/Objective:   Assessment and Plan:   Follow Up Instructions:    I discussed the assessment and treatment plan with the patient. The patient was provided an opportunity to ask questions and all were answered. The patient agreed with the plan and demonstrated an understanding of the instructions.   The patient was advised to call back or seek an in-person evaluation if the symptoms worsen or if the condition fails to improve as anticipated.  I provided 15 minutes of non-face-to-face time during this encounter.   Vicente Males, LPN 15 minutes was spent with patient today discussing healthcare issues which they came.  More than 50% of this visit-total duration of visit-was spent in counseling and coordination of care.  Please see diagnosis regarding the focus of this coordination and care     Review of Systems   Constitutional: Negative for activity change and appetite change.  HENT: Negative for congestion and rhinorrhea.   Respiratory: Negative for cough and shortness of breath.   Cardiovascular: Negative for chest pain and leg swelling.  Gastrointestinal: Negative for abdominal pain, nausea and vomiting.  Skin: Negative for color change.  Neurological: Negative for dizziness and weakness.  Psychiatric/Behavioral: Positive for depression. Negative for agitation and confusion.       Objective:   Physical Exam  Today's visit was via telephone Physical exam was not possible for this visit       Assessment & Plan:  Major depression Continue current medication Recheck again in 1 month Follow-up sooner problems

## 2018-12-17 ENCOUNTER — Telehealth: Payer: Self-pay | Admitting: Family Medicine

## 2018-12-17 NOTE — Telephone Encounter (Signed)
Pharmacy requesting refill on Lisinopril 2.5 mg tablet; take one tablet by mouth daily. Pt last seen 11/29/2018 for depression

## 2018-12-19 ENCOUNTER — Other Ambulatory Visit: Payer: Self-pay | Admitting: Family Medicine

## 2018-12-19 ENCOUNTER — Other Ambulatory Visit: Payer: Self-pay | Admitting: *Deleted

## 2018-12-19 MED ORDER — LISINOPRIL 2.5 MG PO TABS: 2.5000 mg | ORAL_TABLET | Freq: Every day | ORAL | 1 refills | Status: DC

## 2018-12-19 NOTE — Telephone Encounter (Signed)
May have 90-day with 1 refill

## 2018-12-19 NOTE — Telephone Encounter (Signed)
Refills sent to pharm

## 2018-12-21 ENCOUNTER — Ambulatory Visit (INDEPENDENT_AMBULATORY_CARE_PROVIDER_SITE_OTHER): Payer: Medicare Other | Admitting: Family Medicine

## 2018-12-21 ENCOUNTER — Other Ambulatory Visit: Payer: Self-pay

## 2018-12-21 DIAGNOSIS — I480 Paroxysmal atrial fibrillation: Secondary | ICD-10-CM

## 2018-12-21 DIAGNOSIS — F419 Anxiety disorder, unspecified: Secondary | ICD-10-CM

## 2018-12-21 DIAGNOSIS — M5431 Sciatica, right side: Secondary | ICD-10-CM

## 2018-12-21 MED ORDER — TORSEMIDE 20 MG PO TABS: ORAL_TABLET | ORAL | 6 refills | Status: DC

## 2018-12-21 MED ORDER — SERTRALINE HCL 50 MG PO TABS: 50.0000 mg | ORAL_TABLET | Freq: Every day | ORAL | 6 refills | Status: DC

## 2018-12-21 MED ORDER — PANTOPRAZOLE SODIUM 40 MG PO TBEC: 40.0000 mg | DELAYED_RELEASE_TABLET | Freq: Every day | ORAL | 6 refills | Status: DC

## 2018-12-21 MED ORDER — LEVOTHYROXINE SODIUM 75 MCG PO TABS: 75.0000 ug | ORAL_TABLET | Freq: Every day | ORAL | 6 refills | Status: DC

## 2018-12-21 MED ORDER — POTASSIUM CHLORIDE ER 10 MEQ PO TBCR: 10.0000 meq | EXTENDED_RELEASE_TABLET | Freq: Every day | ORAL | 6 refills | Status: DC

## 2018-12-21 NOTE — Progress Notes (Signed)
   Subjective:    Patient ID: [name], female    DOB: 1923-08-06, 85 y.o.   MRN: [id number] Telephone only HPI Pt here for 3 month follow up. Pt has Hydrocodone 5-325 mg; states she doesn't take them much. She only takes them if Acetaminophen doesn't help with pain. Pt states that she sometimes takes 1/2 and other times she may take 1 tablet.  This patient does relate that she has intermittent back pain and discomfort for which she uses half a hydrocodone when necessary does not use it often  Has atrial fibrillation under reasonable control does take her blood thinner denies any bleeding issues  Depression anxiety doing well with medication patient desires to stick with medicine Virtual Visit via Video Note  I connected with [name] on 12/21/18 at  1:10 PM EDT by a video enabled telemedicine application and verified that I am speaking with the correct person using two identifiers.  Location: Patient: home Provider: office   I discussed the limitations of evaluation and management by telemedicine and the availability of in person appointments. The patient expressed understanding and agreed to proceed.  History of Present Illness:    Observations/Objective:   Assessment and Plan:   Follow Up Instructions:    I discussed the assessment and treatment plan with the patient. The patient was provided an opportunity to ask questions and all were answered. The patient agreed with the plan and demonstrated an understanding of the instructions.   The patient was advised to call back or seek an in-person evaluation if the symptoms worsen or if the condition fails to improve as anticipated.  I provided 15 minutes of non-face-to-face time during this encounter.   Vicente Males, LPN     Review of Systems  Constitutional: Negative for activity change, appetite change and fatigue.  HENT: Negative for congestion and rhinorrhea.   Respiratory: Negative for cough and  shortness of breath.   Cardiovascular: Negative for chest pain and leg swelling.  Gastrointestinal: Negative for abdominal pain and diarrhea.  Endocrine: Negative for polydipsia and polyphagia.  Skin: Negative for color change.  Neurological: Negative for dizziness and weakness.  Psychiatric/Behavioral: Negative for behavioral problems and confusion.       Objective:   Physical Exam  Today's visit was via telephone Physical exam was not possible for this visit       Assessment & Plan:  Atrial fib heart rate under good control continue current measures  Anxiety depression stable continue current medication  Chronic sciatic on back pain sparing use of hydrocodone as necessary

## 2019-02-05 ENCOUNTER — Encounter: Payer: Self-pay | Admitting: Family Medicine

## 2019-02-16 ENCOUNTER — Other Ambulatory Visit: Payer: Self-pay

## 2019-02-16 ENCOUNTER — Inpatient Hospital Stay (HOSPITAL_COMMUNITY)
Admission: EM | Admit: 2019-02-16 | Discharge: 2019-02-18 | DRG: 393 | Disposition: A | Discharge status: Home / Self Care | Payer: Medicare Other | Attending: Family Medicine | Admitting: Family Medicine

## 2019-02-16 ENCOUNTER — Encounter (HOSPITAL_COMMUNITY): Payer: Self-pay

## 2019-02-16 DIAGNOSIS — E039 Hypothyroidism, unspecified: Secondary | ICD-10-CM | POA: Diagnosis present

## 2019-02-16 DIAGNOSIS — N179 Acute kidney failure, unspecified: Secondary | ICD-10-CM

## 2019-02-16 DIAGNOSIS — F419 Anxiety disorder, unspecified: Secondary | ICD-10-CM | POA: Diagnosis present

## 2019-02-16 DIAGNOSIS — Z7901 Long term (current) use of anticoagulants: Secondary | ICD-10-CM

## 2019-02-16 DIAGNOSIS — D6832 Hemorrhagic disorder due to extrinsic circulating anticoagulants: Secondary | ICD-10-CM | POA: Diagnosis present

## 2019-02-16 DIAGNOSIS — N3001 Acute cystitis with hematuria: Secondary | ICD-10-CM

## 2019-02-16 DIAGNOSIS — E538 Deficiency of other specified B group vitamins: Secondary | ICD-10-CM | POA: Diagnosis present

## 2019-02-16 DIAGNOSIS — I480 Paroxysmal atrial fibrillation: Secondary | ICD-10-CM | POA: Diagnosis present

## 2019-02-16 DIAGNOSIS — Z882 Allergy status to sulfonamides status: Secondary | ICD-10-CM

## 2019-02-16 DIAGNOSIS — I129 Hypertensive chronic kidney disease with stage 1 through stage 4 chronic kidney disease, or unspecified chronic kidney disease: Secondary | ICD-10-CM | POA: Diagnosis present

## 2019-02-16 DIAGNOSIS — Z888 Allergy status to other drugs, medicaments and biological substances status: Secondary | ICD-10-CM | POA: Diagnosis not present

## 2019-02-16 DIAGNOSIS — I10 Essential (primary) hypertension: Secondary | ICD-10-CM | POA: Diagnosis not present

## 2019-02-16 DIAGNOSIS — D62 Acute posthemorrhagic anemia: Secondary | ICD-10-CM | POA: Diagnosis not present

## 2019-02-16 DIAGNOSIS — K625 Hemorrhage of anus and rectum: Secondary | ICD-10-CM

## 2019-02-16 DIAGNOSIS — Z881 Allergy status to other antibiotic agents status: Secondary | ICD-10-CM

## 2019-02-16 DIAGNOSIS — T45515A Adverse effect of anticoagulants, initial encounter: Secondary | ICD-10-CM | POA: Diagnosis not present

## 2019-02-16 DIAGNOSIS — E785 Hyperlipidemia, unspecified: Secondary | ICD-10-CM | POA: Diagnosis present

## 2019-02-16 DIAGNOSIS — N182 Chronic kidney disease, stage 2 (mild): Secondary | ICD-10-CM | POA: Diagnosis present

## 2019-02-16 DIAGNOSIS — Z853 Personal history of malignant neoplasm of breast: Secondary | ICD-10-CM | POA: Diagnosis not present

## 2019-02-16 DIAGNOSIS — K642 Third degree hemorrhoids: Secondary | ICD-10-CM | POA: Diagnosis not present

## 2019-02-16 DIAGNOSIS — R450 Nervousness: Secondary | ICD-10-CM | POA: Diagnosis not present

## 2019-02-16 DIAGNOSIS — Z03818 Encounter for observation for suspected exposure to other biological agents ruled out: Secondary | ICD-10-CM | POA: Diagnosis not present

## 2019-02-16 DIAGNOSIS — K922 Gastrointestinal hemorrhage, unspecified: Secondary | ICD-10-CM | POA: Diagnosis not present

## 2019-02-16 DIAGNOSIS — F320 Major depressive disorder, single episode, mild: Secondary | ICD-10-CM

## 2019-02-16 DIAGNOSIS — Z79899 Other long term (current) drug therapy: Secondary | ICD-10-CM

## 2019-02-16 DIAGNOSIS — F329 Major depressive disorder, single episode, unspecified: Secondary | ICD-10-CM | POA: Diagnosis not present

## 2019-02-16 DIAGNOSIS — K219 Gastro-esophageal reflux disease without esophagitis: Secondary | ICD-10-CM | POA: Diagnosis not present

## 2019-02-16 DIAGNOSIS — Z20828 Contact with and (suspected) exposure to other viral communicable diseases: Secondary | ICD-10-CM | POA: Diagnosis not present

## 2019-02-16 DIAGNOSIS — B961 Klebsiella pneumoniae [K. pneumoniae] as the cause of diseases classified elsewhere: Secondary | ICD-10-CM | POA: Diagnosis present

## 2019-02-16 DIAGNOSIS — M810 Age-related osteoporosis without current pathological fracture: Secondary | ICD-10-CM | POA: Diagnosis present

## 2019-02-16 DIAGNOSIS — Z9049 Acquired absence of other specified parts of digestive tract: Secondary | ICD-10-CM

## 2019-02-16 DIAGNOSIS — I4819 Other persistent atrial fibrillation: Secondary | ICD-10-CM | POA: Diagnosis not present

## 2019-02-16 DIAGNOSIS — Z8719 Personal history of other diseases of the digestive system: Secondary | ICD-10-CM

## 2019-02-16 DIAGNOSIS — K5791 Diverticulosis of intestine, part unspecified, without perforation or abscess with bleeding: Secondary | ICD-10-CM | POA: Diagnosis present

## 2019-02-16 DIAGNOSIS — R58 Hemorrhage, not elsewhere classified: Secondary | ICD-10-CM | POA: Diagnosis not present

## 2019-02-16 DIAGNOSIS — R0902 Hypoxemia: Secondary | ICD-10-CM | POA: Diagnosis not present

## 2019-02-16 DIAGNOSIS — Z885 Allergy status to narcotic agent status: Secondary | ICD-10-CM

## 2019-02-16 DIAGNOSIS — Z79890 Hormone replacement therapy (postmenopausal): Secondary | ICD-10-CM

## 2019-02-16 DIAGNOSIS — Z880 Allergy status to penicillin: Secondary | ICD-10-CM

## 2019-02-16 DIAGNOSIS — Z833 Family history of diabetes mellitus: Secondary | ICD-10-CM

## 2019-02-16 DIAGNOSIS — Z8349 Family history of other endocrine, nutritional and metabolic diseases: Secondary | ICD-10-CM

## 2019-02-16 DIAGNOSIS — I4891 Unspecified atrial fibrillation: Secondary | ICD-10-CM | POA: Diagnosis present

## 2019-02-16 LAB — COMPREHENSIVE METABOLIC PANEL
ALT: 14 U/L (ref 0–44)
AST: 21 U/L (ref 15–41)
Albumin: 3.9 g/dL (ref 3.5–5.0)
Alkaline Phosphatase: 57 U/L (ref 38–126)
Anion gap: 9 (ref 5–15)
BUN: 36 mg/dL — ABNORMAL HIGH (ref 8–23)
CO2: 26 mmol/L (ref 22–32)
Calcium: 8.8 mg/dL — ABNORMAL LOW (ref 8.9–10.3)
Chloride: 107 mmol/L (ref 98–111)
Creatinine, Ser: 1.73 mg/dL — ABNORMAL HIGH (ref 0.44–1.00)
GFR calc Af Amer: 29 mL/min — ABNORMAL LOW (ref >60)
GFR calc non Af Amer: 25 mL/min — ABNORMAL LOW (ref >60)
Glucose, Bld: 130 mg/dL — ABNORMAL HIGH (ref 70–99)
Potassium: 3.8 mmol/L (ref 3.5–5.1)
Sodium: 142 mmol/L (ref 135–145)
Total Bilirubin: 0.7 mg/dL (ref 0.3–1.2)
Total Protein: 7.2 g/dL (ref 6.5–8.1)

## 2019-02-16 LAB — URINALYSIS, ROUTINE W REFLEX MICROSCOPIC
Glucose, UA: 100 mg/dL — AB
Ketones, ur: 15 mg/dL — AB
Nitrite: POSITIVE — AB
Protein, ur: >300 mg/dL — AB
Specific Gravity, Urine: 1.015 (ref 1.005–1.030)
pH: 8 (ref 5.0–8.0)

## 2019-02-16 LAB — URINALYSIS, MICROSCOPIC (REFLEX): RBC / HPF: >50 RBC/hpf (ref 0–5)

## 2019-02-16 LAB — CBC WITH DIFFERENTIAL/PLATELET
Abs Immature Granulocytes: 0.03 10*3/uL (ref 0.00–0.07)
Basophils Absolute: 0 10*3/uL (ref 0.0–0.1)
Basophils Relative: 0 %
Eosinophils Absolute: 0.2 10*3/uL (ref 0.0–0.5)
Eosinophils Relative: 3 %
HCT: 35.1 % — ABNORMAL LOW (ref 36.0–46.0)
Hemoglobin: 11 g/dL — ABNORMAL LOW (ref 12.0–15.0)
Immature Granulocytes: 0 %
Lymphocytes Relative: 31 %
Lymphs Abs: 2.2 10*3/uL (ref 0.7–4.0)
MCH: 28.9 pg (ref 26.0–34.0)
MCHC: 31.3 g/dL (ref 30.0–36.0)
MCV: 92.4 fL (ref 80.0–100.0)
Monocytes Absolute: 0.6 10*3/uL (ref 0.1–1.0)
Monocytes Relative: 8 %
Neutro Abs: 4.1 10*3/uL (ref 1.7–7.7)
Neutrophils Relative %: 58 %
Platelets: 242 10*3/uL (ref 150–400)
RBC: 3.8 MIL/uL — ABNORMAL LOW (ref 3.87–5.11)
RDW: 12.2 % (ref 11.5–15.5)
WBC: 7.2 10*3/uL (ref 4.0–10.5)
nRBC: 0 % (ref 0.0–0.2)

## 2019-02-16 LAB — TYPE AND SCREEN
ABO/RH(D): A POS
Antibody Screen: NEGATIVE

## 2019-02-16 LAB — POC OCCULT BLOOD, ED: Fecal Occult Bld: POSITIVE — AB

## 2019-02-16 LAB — HEMOGLOBIN AND HEMATOCRIT, BLOOD
HCT: 33.3 % — ABNORMAL LOW (ref 36.0–46.0)
HCT: 29.8 % — ABNORMAL LOW (ref 36.0–46.0)
Hemoglobin: 10.5 g/dL — ABNORMAL LOW (ref 12.0–15.0)
Hemoglobin: 9.4 g/dL — ABNORMAL LOW (ref 12.0–15.0)

## 2019-02-16 LAB — SARS CORONAVIRUS 2 BY RT PCR (HOSPITAL ORDER, PERFORMED IN ~~LOC~~ HOSPITAL LAB): SARS Coronavirus 2: NEGATIVE

## 2019-02-16 MED ORDER — ADULT MULTIVITAMIN W/MINERALS CH
1.0000 | ORAL_TABLET | Freq: Every day | ORAL | Status: DC
  Administered 2019-02-16 – 2019-02-18 (×3): 1 via ORAL
  Filled 2019-02-16 (×3): qty 1

## 2019-02-16 MED ORDER — ACETAMINOPHEN 650 MG RE SUPP: 650.0000 mg | Freq: Four times a day (QID) | RECTAL | Status: DC | PRN

## 2019-02-16 MED ORDER — CIPROFLOXACIN IN D5W 400 MG/200ML IV SOLN
400.0000 mg | Freq: Once | INTRAVENOUS | Status: AC
  Administered 2019-02-16: 400 mg via INTRAVENOUS
  Filled 2019-02-16: qty 200

## 2019-02-16 MED ORDER — SODIUM CHLORIDE 0.9 % IV SOLN: 250.0000 mL | INTRAVENOUS | Status: DC | PRN

## 2019-02-16 MED ORDER — SODIUM CHLORIDE 0.9% FLUSH: 3.0000 mL | INTRAVENOUS | Status: DC | PRN

## 2019-02-16 MED ORDER — ONDANSETRON HCL 4 MG/2ML IJ SOLN: 4.0000 mg | Freq: Four times a day (QID) | INTRAMUSCULAR | Status: DC | PRN

## 2019-02-16 MED ORDER — METOPROLOL SUCCINATE ER 50 MG PO TB24
50.0000 mg | ORAL_TABLET | Freq: Every day | ORAL | Status: DC
  Administered 2019-02-16 – 2019-02-18 (×3): 50 mg via ORAL
  Filled 2019-02-16 (×3): qty 1

## 2019-02-16 MED ORDER — ONDANSETRON HCL 4 MG PO TABS: 4.0000 mg | ORAL_TABLET | Freq: Four times a day (QID) | ORAL | Status: DC | PRN

## 2019-02-16 MED ORDER — ACETAMINOPHEN 325 MG PO TABS
650.0000 mg | ORAL_TABLET | Freq: Two times a day (BID) | ORAL | Status: DC
  Administered 2019-02-16 – 2019-02-18 (×4): 650 mg via ORAL
  Filled 2019-02-16 (×4): qty 2

## 2019-02-16 MED ORDER — TRAZODONE HCL 50 MG PO TABS: 50.0000 mg | ORAL_TABLET | Freq: Every evening | ORAL | Status: DC | PRN

## 2019-02-16 MED ORDER — VITAMIN B-12 1000 MCG PO TABS
1000.0000 ug | ORAL_TABLET | Freq: Every day | ORAL | Status: DC
  Administered 2019-02-16 – 2019-02-18 (×3): 1000 ug via ORAL
  Filled 2019-02-16 (×6): qty 1

## 2019-02-16 MED ORDER — PANTOPRAZOLE SODIUM 40 MG IV SOLR
40.0000 mg | Freq: Two times a day (BID) | INTRAVENOUS | Status: DC
  Administered 2019-02-16 – 2019-02-18 (×4): 40 mg via INTRAVENOUS
  Filled 2019-02-16 (×4): qty 40

## 2019-02-16 MED ORDER — CALCIUM CARBONATE ANTACID 500 MG PO CHEW
1.0000 | CHEWABLE_TABLET | Freq: Every day | ORAL | Status: DC
  Administered 2019-02-16 – 2019-02-18 (×4): 200 mg via ORAL
  Filled 2019-02-16 (×3): qty 1

## 2019-02-16 MED ORDER — SODIUM CHLORIDE 0.9% FLUSH
3.0000 mL | Freq: Two times a day (BID) | INTRAVENOUS | Status: DC
  Administered 2019-02-16 – 2019-02-18 (×3): 3 mL via INTRAVENOUS

## 2019-02-16 MED ORDER — ALBUTEROL SULFATE (2.5 MG/3ML) 0.083% IN NEBU: 2.5000 mg | INHALATION_SOLUTION | RESPIRATORY_TRACT | Status: DC | PRN

## 2019-02-16 MED ORDER — ACETAMINOPHEN 325 MG PO TABS: 650.0000 mg | ORAL_TABLET | Freq: Four times a day (QID) | ORAL | Status: DC | PRN

## 2019-02-16 MED ORDER — CIPROFLOXACIN IN D5W 200 MG/100ML IV SOLN
200.0000 mg | INTRAVENOUS | Status: DC
  Administered 2019-02-17: 200 mg via INTRAVENOUS
  Filled 2019-02-16 (×3): qty 100

## 2019-02-16 MED ORDER — LEVOTHYROXINE SODIUM 75 MCG PO TABS
75.0000 ug | ORAL_TABLET | Freq: Every day | ORAL | Status: DC
  Administered 2019-02-17 – 2019-02-18 (×2): 75 ug via ORAL
  Filled 2019-02-16 (×2): qty 1

## 2019-02-16 MED ORDER — ALPRAZOLAM 0.25 MG PO TABS
0.2500 mg | ORAL_TABLET | Freq: Two times a day (BID) | ORAL | Status: DC | PRN
  Administered 2019-02-16: 0.125 mg via ORAL
  Filled 2019-02-16: qty 1

## 2019-02-16 MED ORDER — SODIUM CHLORIDE 0.9 % IV SOLN
INTRAVENOUS | Status: DC
  Administered 2019-02-16 – 2019-02-17 (×2): via INTRAVENOUS

## 2019-02-16 MED ORDER — ONDANSETRON 4 MG PO TBDP: 4.0000 mg | ORAL_TABLET | Freq: Three times a day (TID) | ORAL | Status: DC | PRN

## 2019-02-16 MED ORDER — DILTIAZEM HCL ER COATED BEADS 120 MG PO CP24
120.0000 mg | ORAL_CAPSULE | Freq: Every day | ORAL | Status: DC
  Administered 2019-02-16 – 2019-02-18 (×3): 120 mg via ORAL
  Filled 2019-02-16 (×3): qty 1

## 2019-02-16 MED ORDER — POTASSIUM CHLORIDE CRYS ER 10 MEQ PO TBCR
10.0000 meq | EXTENDED_RELEASE_TABLET | Freq: Every day | ORAL | Status: DC
  Administered 2019-02-16 – 2019-02-18 (×3): 10 meq via ORAL
  Filled 2019-02-16 (×6): qty 1

## 2019-02-16 MED ORDER — SERTRALINE HCL 50 MG PO TABS
50.0000 mg | ORAL_TABLET | Freq: Every day | ORAL | Status: DC
  Administered 2019-02-16 – 2019-02-18 (×3): 50 mg via ORAL
  Filled 2019-02-16 (×3): qty 1

## 2019-02-16 MED ORDER — POLYETHYLENE GLYCOL 3350 17 G PO PACK: 17.0000 g | PACK | Freq: Every day | ORAL | Status: DC | PRN

## 2019-02-16 MED ORDER — PANTOPRAZOLE SODIUM 40 MG PO TBEC: 40.0000 mg | DELAYED_RELEASE_TABLET | Freq: Every day | ORAL | Status: DC

## 2019-02-16 NOTE — ED Notes (Signed)
ED TO INPATIENT HANDOFF REPORT  ED Nurse Name and Phone #: Graylee Arutyunyan [phone]  S Name/Age/Gender [name] 85 y.o. female Room/Bed: APA05/APA05  Code Status   Code Status: Prior  Home/SNF/Other Home Patient oriented to: self, place, time and situation Is this baseline? Yes   Triage Complete: Triage complete  Chief Complaint EMS  Triage Note EMS reports pt has history of a fib.  Reports noticed bright red blood in stool this morning.  C/O lower abd pain.     Allergies Allergies  Allergen Reactions  . Amiodarone Itching  . Amoxicillin Hives  . Cephalosporins Itching  . Lipitor [Atorvastatin] Other (See Comments)    Unknown:patient is not aware of this allergy  . Lorazepam Other (See Comments)    Exceptional fatigue   . Lovenox [Enoxaparin Sodium] Nausea And Vomiting  . Morphine And Related Itching  . Penicillins Swelling    Has patient had a PCN reaction causing immediate rash, facial/tongue/throat swelling, SOB or lightheadedness with hypotension: unknown Has patient had a PCN reaction causing severe rash involving mucus membranes or skin necrosis: unknown Has patient had a PCN reaction that required hospitalization No Has patient had a PCN reaction occurring within the last 10 years: no If all of the above answers are "NO", then may proceed with Cephalosporin use.   . Sulfa Antibiotics Swelling    Level of Care/Admitting Diagnosis ED Disposition    ED Disposition Condition [facility] [facility]: [facility] [id number]  Level of Care: Telemetry [5]  Covid Evaluation: N/A  Diagnosis: GI bleed [id number]  Admitting Physician: Morrison Old  Attending Physician: Morrison Old  Bed request comments: tele  PT Class (Do Not Modify): Observation [104]  PT Acc Code (Do Not Modify): Observation [10022]       B Medical/Surgery History Past Medical History:  Diagnosis Date  . Abdominal pain, chronic, right lower  quadrant    "ever since I had my appendix removed"  . Atrial fibrillation ([place])   . Breast cancer ([place])   . Cancer ([place])    left side  . Chronic back pain   . Edema   . GERD (gastroesophageal reflux disease)   . Hyperlipidemia   . Hypertension   . Osteopenia   . Osteoporosis   . Ovarian cyst 02/2015   left  . Recurrent abdominal pain    LLQ   Past Surgical History:  Procedure Laterality Date  . APPENDECTOMY    . APPENDECTOMY    . BREAST SURGERY Left    lumpectomy  . CARDIOVERSION N/A 03/31/2016   Procedure: CARDIOVERSION;  Surgeon: Josue Hector, MD;  Location: [facility];  Service: Cardiovascular;  Laterality: N/A;  . COLON RESECTION    . COLON SURGERY    . HERNIA REPAIR    . HERNIA REPAIR       A IV Location/Drains/Wounds Patient Lines/Drains/Airways Status   Active Line/Drains/Airways    Name:   Placement date:   Placement time:   Site:   Days:   Peripheral IV 02/16/19 Left Antecubital   02/16/19    1325    Antecubital   less than 1          Intake/Output Last 24 hours No intake or output data in the 24 hours ending 02/16/19 1811  Labs/Imaging Results for orders placed or performed during the hospital encounter of 02/16/19 (from the past 48 hour(s))  Type and screen [facility]     Status: None  Collection Time: 02/16/19  8:55 AM  Result Value Ref Range   ABO/RH(D) A POS    Antibody Screen NEG    Sample Expiration      02/19/2019,2359 Performed at [facility], [address] [place], [place] [address]   CBC with Differential/Platelet     Status: Abnormal   Collection Time: 02/16/19  8:58 AM  Result Value Ref Range   WBC 7.2 4.0 - 10.5 K/uL   RBC 3.80 (L) 3.87 - 5.11 MIL/uL   Hemoglobin 11.0 (L) 12.0 - 15.0 g/dL   HCT 35.1 (L) 36.0 - 46.0 %   MCV 92.4 80.0 - 100.0 fL   MCH 28.9 26.0 - 34.0 pg   MCHC 31.3 30.0 - 36.0 g/dL   RDW 12.2 11.5 - 15.5 %   Platelets 242 150 - 400 K/uL   nRBC 0.0 0.0 - 0.2 %   Neutrophils Relative % 58 %   Neutro  Abs 4.1 1.7 - 7.7 K/uL   Lymphocytes Relative 31 %   Lymphs Abs 2.2 0.7 - 4.0 K/uL   Monocytes Relative 8 %   Monocytes Absolute 0.6 0.1 - 1.0 K/uL   Eosinophils Relative 3 %   Eosinophils Absolute 0.2 0.0 - 0.5 K/uL   Basophils Relative 0 %   Basophils Absolute 0.0 0.0 - 0.1 K/uL   Immature Granulocytes 0 %   Abs Immature Granulocytes 0.03 0.00 - 0.07 K/uL    Comment: Performed at [facility], [address] [place], [place] [address]  Comprehensive metabolic panel     Status: Abnormal   Collection Time: 02/16/19  8:58 AM  Result Value Ref Range   Sodium 142 135 - 145 mmol/L   Potassium 3.8 3.5 - 5.1 mmol/L   Chloride 107 98 - 111 mmol/L   CO2 26 22 - 32 mmol/L   Glucose, Bld 130 (H) 70 - 99 mg/dL   BUN 36 (H) 8 - 23 mg/dL   Creatinine, Ser 1.73 (H) 0.44 - 1.00 mg/dL   Calcium 8.8 (L) 8.9 - 10.3 mg/dL   Total Protein 7.2 6.5 - 8.1 g/dL   Albumin 3.9 3.5 - 5.0 g/dL   AST 21 15 - 41 U/L   ALT 14 0 - 44 U/L   Alkaline Phosphatase 57 38 - 126 U/L   Total Bilirubin 0.7 0.3 - 1.2 mg/dL   GFR calc non Af Amer 25 (L) >60 mL/min   GFR calc Af Amer 29 (L) >60 mL/min   Anion gap 9 5 - 15    Comment: Performed at [facility], [address], [place], [place] [address]  POC occult blood, ED RN will collect     Status: Abnormal   Collection Time: 02/16/19  9:25 AM  Result Value Ref Range   Fecal Occult Bld POSITIVE (A) NEGATIVE  Urinalysis, Routine w reflex microscopic     Status: Abnormal   Collection Time: 02/16/19  9:59 AM  Result Value Ref Range   Color, Urine RED (A) YELLOW    Comment: BIOCHEMICALS MAY BE AFFECTED BY COLOR   APPearance HAZY (A) CLEAR   Specific Gravity, Urine 1.015 1.005 - 1.030   pH 8.0 5.0 - 8.0   Glucose, UA 100 (A) NEGATIVE mg/dL   Hgb urine dipstick LARGE (A) NEGATIVE   Bilirubin Urine MODERATE (A) NEGATIVE   Ketones, ur 15 (A) NEGATIVE mg/dL   Protein, ur >300 (A) NEGATIVE mg/dL   Nitrite POSITIVE (A) NEGATIVE   Leukocytes,Ua LARGE (A) NEGATIVE  Comment: Performed at [facility], [address], [place], [place] [address]  Urinalysis, Microscopic (reflex)     Status: Abnormal   Collection Time: 02/16/19  9:59 AM  Result Value Ref Range   RBC / HPF >50 0 - 5 RBC/hpf   WBC, UA 11-20 0 - 5 WBC/hpf   Bacteria, UA MANY (A) NONE SEEN   Squamous Epithelial / LPF 0-5 0 - 5    Comment: Performed at [facility], [address] [place], [place] [address]  SARS Coronavirus 2 [facility] order, Performed in [facility] hospital lab) Nasopharyngeal Nasopharyngeal Swab     Status: None   Collection Time: 02/16/19 12:33 PM   Specimen: Nasopharyngeal Swab  Result Value Ref Range   SARS Coronavirus 2 NEGATIVE NEGATIVE    Comment: (NOTE) If result is NEGATIVE SARS-CoV-2 target nucleic acids are NOT DETECTED. The SARS-CoV-2 RNA is generally detectable in upper and lower  respiratory specimens during the acute phase of infection. The lowest  concentration of SARS-CoV-2 viral copies this assay can detect is 250  copies / mL. A negative result does not preclude SARS-CoV-2 infection  and should not be used as the sole basis for treatment or other  patient management decisions.  A negative result may occur with  improper specimen collection / handling, submission of specimen other  than nasopharyngeal swab, presence of viral mutation(s) within the  areas targeted by this assay, and inadequate number of viral copies  (<250 copies / mL). A negative result must be combined with clinical  observations, patient history, and epidemiological information. If result is POSITIVE SARS-CoV-2 target nucleic acids are DETECTED. The SARS-CoV-2 RNA is generally detectable in upper and lower  respiratory specimens dur ing the acute phase of infection.  Positive  results are indicative of active infection with SARS-CoV-2.  Clinical  correlation with patient history and other diagnostic information is  necessary to determine patient infection status.  Positive  results do  not rule out bacterial infection or co-infection with other viruses. If result is PRESUMPTIVE POSTIVE SARS-CoV-2 nucleic acids MAY BE PRESENT.   A presumptive positive result was obtained on the submitted specimen  and confirmed on repeat testing.  While 2019 novel coronavirus  (SARS-CoV-2) nucleic acids may be present in the submitted sample  additional confirmatory testing may be necessary for epidemiological  and / or clinical management purposes  to differentiate between  SARS-CoV-2 and other Sarbecovirus currently known to infect humans.  If clinically indicated additional testing with an alternate test  methodology [phone]) is advised. The SARS-CoV-2 RNA is generally  detectable in upper and lower respiratory sp ecimens during the acute  phase of infection. The expected result is Negative. Fact Sheet for Patients:  StrictlyIdeas.no Fact Sheet for Healthcare Providers: BankingDealers.co.za This test is not yet approved or cleared by the [place] FDA and has been authorized for detection and/or diagnosis of SARS-CoV-2 by FDA under an Emergency Use Authorization (EUA).  This EUA will remain in effect (meaning this test can be used) for the duration of the COVID-19 declaration under Section 564(b)(1) of the Act, 21 U.S.C. section 360bbb-3(b)(1), unless the authorization is terminated or revoked sooner. Performed at [facility], [address], [place], [place] [address]   Hemoglobin and hematocrit, blood     Status: Abnormal   Collection Time: 02/16/19  3:13 PM  Result Value Ref Range   Hemoglobin 10.5 (L) 12.0 - 15.0 g/dL   HCT 33.3 (L) 36.0 - 46.0 %  Comment: Performed at [facility], [address], [place], [place] [address]   No results found.  Pending Labs Unresulted Labs (From admission, onward)    Start     Ordered   02/16/19 1500  Hemoglobin and hematocrit, blood  Now then every 8 hours,   R (with  STAT occurrences)     02/16/19 1159   02/16/19 1123  Urine Culture  ONCE - STAT,   STAT     02/16/19 1123          Vitals/Pain Today's Vitals   02/16/19 1530 02/16/19 1600 02/16/19 1615 02/16/19 1630  BP: (!) 154/71 (!) 152/70  (!) 159/111  Pulse: 80  78   Resp: 17 17 18    Temp:      TempSrc:      SpO2: 93%  94%   Weight:      PainSc:        Isolation Precautions Airborne and Contact precautions  Medications Medications  0.9 %  sodium chloride infusion ( Intravenous New Bag/Given 02/16/19 0922)  ciprofloxacin (CIPRO) IVPB 400 mg (400 mg Intravenous New Bag/Given 02/16/19 1134)    Mobility walks with device Moderate fall risk   Focused Assessments    R Recommendations: See Admitting Provider Note  Report given to:   Additional Notes:

## 2019-02-16 NOTE — Progress Notes (Signed)
Moderate amount of bright red blood noted when assisting pt.to BSC. No bowel movement noted. Will continue to monitor.

## 2019-02-16 NOTE — ED Notes (Signed)
edh     

## 2019-02-16 NOTE — Progress Notes (Signed)
Pharmacy Antibiotic Note  [name] is a 85 y.o. female admitted on 02/16/2019 with UTI.  Pharmacy has been consulted for  ciprofloxacin dosing. This patient has AKI, possibly due to GI bleed and pre-renal azotemia.  Plan: Give ciprofloxacin 400mg  IV x1 dose Then start ciprofloxacin 200mg  IV q24h on 02-17-19 at 1100 Pharmacy to monitor renal function, cultures and patient progress.  Weight: 135 lb (61.2 kg)  Temp (24hrs), Avg:98.8 F (37.1 C), Min:98.8 F (37.1 C), Max:98.8 F (37.1 C)  Recent Labs  Lab 02/16/19 0858  WBC 7.2  CREATININE 1.73*    CrCl cannot be calculated (Unknown ideal weight.).    Allergies  Allergen Reactions  . Amiodarone Itching  . Amoxicillin Hives  . Cephalosporins Itching  . Lipitor [Atorvastatin] Other (See Comments)    Unknown:patient is not aware of this allergy  . Lorazepam Other (See Comments)    Exceptional fatigue   . Lovenox [Enoxaparin Sodium] Nausea And Vomiting  . Morphine And Related Itching  . Penicillins Swelling    Has patient had a PCN reaction causing immediate rash, facial/tongue/throat swelling, SOB or lightheadedness with hypotension: unknown Has patient had a PCN reaction causing severe rash involving mucus membranes or skin necrosis: unknown Has patient had a PCN reaction that required hospitalization No Has patient had a PCN reaction occurring within the last 10 years: no If all of the above answers are "NO", then may proceed with Cephalosporin use.   . Sulfa Antibiotics Swelling    Antimicrobials this admission: ciprofloxacin  9/26>>     Dose adjustments this admission: ciprofloxacin  Microbiology results:  9/26 UCx:      Thank you for allowing pharmacy to be a part of this patient's care.  Despina Pole 02/16/2019 7:21 PM

## 2019-02-16 NOTE — ED Provider Notes (Addendum)
[facility] EMERGENCY DEPARTMENT Provider Note   CSN: [id number] Arrival date & time: 02/16/19  [id number]     History   Chief Complaint Chief Complaint  Patient presents with  . GI Bleeding    HPI [name] is a 85 y.o. female.     Patient with a large bloody bowel movement at about 630 this morning.  Has had a bowel movement since then without any significant large amount of blood.  Did have some mild abdominal discomfort with the first bowel movement but has no abdominal pain now.  Patient is on Xarelto for atrial fib.  No nausea or vomiting.  No prior history of bleeding and bowel movements.  Patient lives by herself at home.     Past Medical History:  Diagnosis Date  . Abdominal pain, chronic, right lower quadrant    "ever since I had my appendix removed"  . Atrial fibrillation ([place])   . Breast cancer ([place])   . Cancer ([place])    left side  . Chronic back pain   . Edema   . GERD (gastroesophageal reflux disease)   . Hyperlipidemia   . Hypertension   . Osteopenia   . Osteoporosis   . Ovarian cyst 02/2015   left  . Recurrent abdominal pain    LLQ    Patient Active Problem List   Diagnosis Date Noted  . HLD (hyperlipidemia) 07/18/2017  . B12 deficiency 07/18/2017  . SAH (subarachnoid hemorrhage) ([place]) 07/17/2017  . Hypertensive urgency 07/16/2017  . ICH (intracerebral hemorrhage) ([place]) 07/16/2017  . Cervical disc disease 05/09/2017  . Hypothyroid 05/09/2017  . Major depression in partial remission ([place]) 03/17/2016  . Atrial fibrillation ([place]) 01/12/2016  . Osteoarthritis of right knee 12/15/2015  . Major depression 09/22/2015  . Ventral hernia 05/21/2015  . Ovarian tumor 04/13/2015  . Squamous cell skin cancer 08/13/2014  . Impaired fasting glucose 08/05/2013  . Neuropathy 01/11/2013  . Long term (current) use of anticoagulants 07/27/2012  . Atrial fibrillation with RVR ([place]) 01/29/2012  . Anxiety 01/29/2012  . GERD (gastroesophageal reflux disease)  01/29/2012  . Chronic anticoagulation 01/29/2012  . Sciatica of right side 05/10/2011    Past Surgical History:  Procedure Laterality Date  . APPENDECTOMY    . APPENDECTOMY    . BREAST SURGERY Left    lumpectomy  . CARDIOVERSION N/A 03/31/2016   Procedure: CARDIOVERSION;  Surgeon: Josue Hector, MD;  Location: [facility];  Service: Cardiovascular;  Laterality: N/A;  . COLON RESECTION    . COLON SURGERY    . HERNIA REPAIR    . HERNIA REPAIR       OB History    Gravida  2   Para  1   Term      Preterm      AB  1   Living  1     SAB  1   TAB      Ectopic      Multiple      Live Births               Home Medications    Prior to Admission medications   Medication Sig Start Date End Date Taking? Authorizing Provider  Acetaminophen (TYLENOL PO) Take 650 mg by mouth every 8 (eight) hours as needed (Pain).     [provider]  ALPRAZolam Duanne Moron) 0.25 MG tablet TAKE 1 TABLET BY MOUTH TWICE DAILY AS NEEDED FOR ANXIETY OR SLEEP. 10/18/18   [name], MD  calcium carbonate (TUMS - DOSED IN MG ELEMENTAL CALCIUM) 500 MG chewable tablet Chew 1 tablet by mouth 2 (two) times daily.    [provider]  diltiazem (CARDIZEM CD) 120 MG 24 hr capsule TAKE (1) CAPSULE BY MOUTH ONCE DAILY. 09/17/18   Satira Sark, MD  HYDROcodone-acetaminophen (NORCO/VICODIN) 5-325 MG tablet 1/2 tablet every 12 hours as needed use sparingly 10/17/18   [name], MD  levothyroxine (SYNTHROID) 75 MCG tablet Take 1 tablet (75 mcg total) by mouth daily. 12/21/18   [name], MD  lisinopril (ZESTRIL) 2.5 MG tablet Take 1 tablet (2.5 mg total) by mouth daily. 12/19/18   [name], MD  metoprolol succinate (TOPROL-XL) 50 MG 24 hr tablet Take 1 tablet (50 mg total) by mouth daily. Take with or immediately following a meal. 03/06/18   [name], MD  Multiple Vitamin (MULTIVITAMIN) tablet Take 1 tablet by mouth daily.    [provider]  ondansetron  (ZOFRAN-ODT) 4 MG disintegrating tablet Take 1 tablet (4 mg total) by mouth every 8 (eight) hours as needed for nausea or vomiting. 06/22/18   Davonna Belling, MD  pantoprazole (PROTONIX) 40 MG tablet Take 1 tablet (40 mg total) by mouth daily. 12/21/18   [name], MD  potassium chloride (K-DUR) 10 MEQ tablet Take 1 tablet (10 mEq total) by mouth daily. 12/21/18   [name], MD  sertraline (ZOLOFT) 50 MG tablet Take 1 tablet (50 mg total) by mouth daily. 12/21/18   [name], MD  torsemide (DEMADEX) 20 MG tablet TAKE 2 TABLETS DAILY EVERY MORNING. 12/21/18   [name], MD  vitamin B-12 1000 MCG tablet Take 1 tablet (1,000 mcg total) by mouth daily. 07/21/17   Mary Sella, NP  XARELTO 20 MG TABS tablet TAKE 1 TABLET BY MOUTH ONCE DAILY WITH SUPPER. 10/18/18   [name], MD    Family History Family History  Problem Relation Age of Onset  . Diabetes Mother   . Heart disease Other   . Arthritis Other   . Cancer Other   . Hyperlipidemia Brother     Social History Social History   Tobacco Use  . Smoking status: Never Smoker  . Smokeless tobacco: Never Used  Substance Use Topics  . Alcohol use: No    Alcohol/week: 0.0 standard drinks  . Drug use: No     Allergies   Amiodarone, Amoxicillin, Cephalosporins, Lipitor [atorvastatin], Lorazepam, Lovenox [enoxaparin sodium], Morphine and related, Penicillins, and Sulfa antibiotics   Review of Systems Review of Systems  Constitutional: Negative for chills and fever.  HENT: Negative for congestion, rhinorrhea and sore throat.   Eyes: Negative for visual disturbance.  Respiratory: Negative for cough and shortness of breath.   Cardiovascular: Negative for chest pain and leg swelling.  Gastrointestinal: Positive for blood in stool. Negative for abdominal pain, diarrhea, nausea and vomiting.  Genitourinary: Negative for dysuria.  Musculoskeletal: Negative for back pain and neck pain.  Skin: Negative for rash.   Neurological: Negative for dizziness, light-headedness and headaches.  Hematological: Does not bruise/bleed easily.  Psychiatric/Behavioral: Negative for confusion.     Physical Exam Updated Vital Signs BP 127/73   Pulse 69   Temp 98.8 F (37.1 C) (Oral)   Resp (!) 21   Wt 61.2 kg   SpO2 94%   BMI 23.17 kg/m   Physical Exam Vitals signs and nursing note reviewed.  Constitutional:      General: She is  not in acute distress.    Appearance: Normal appearance. She is well-developed.  HENT:     Head: Normocephalic and atraumatic.  Eyes:     Extraocular Movements: Extraocular movements intact.     Conjunctiva/sclera: Conjunctivae normal.     Pupils: Pupils are equal, round, and reactive to light.  Neck:     Musculoskeletal: Neck supple.  Cardiovascular:     Rate and Rhythm: Normal rate and regular rhythm.     Heart sounds: No murmur.  Pulmonary:     Effort: Pulmonary effort is normal. No respiratory distress.     Breath sounds: Normal breath sounds.  Abdominal:     Palpations: Abdomen is soft.     Tenderness: There is no abdominal tenderness.  Genitourinary:    Rectum: Guaiac result positive.  Musculoskeletal:        General: No swelling.  Skin:    General: Skin is warm and dry.  Neurological:     General: No focal deficit present.     Mental Status: She is alert and oriented to person, place, and time.      ED Treatments / Results  Labs (all labs ordered are listed, but only abnormal results are displayed) Labs Reviewed  CBC WITH DIFFERENTIAL/PLATELET - Abnormal; Notable for the following components:      Result Value   RBC 3.80 (*)    Hemoglobin 11.0 (*)    HCT 35.1 (*)    All other components within normal limits  COMPREHENSIVE METABOLIC PANEL - Abnormal; Notable for the following components:   Glucose, Bld 130 (*)    BUN 36 (*)    Creatinine, Ser 1.73 (*)    Calcium 8.8 (*)    GFR calc non Af Amer 25 (*)    GFR calc Af Amer 29 (*)    All other  components within normal limits  URINALYSIS, ROUTINE W REFLEX MICROSCOPIC - Abnormal; Notable for the following components:   Color, Urine RED (*)    APPearance HAZY (*)    Glucose, UA 100 (*)    Hgb urine dipstick LARGE (*)    Bilirubin Urine MODERATE (*)    Ketones, ur 15 (*)    Protein, ur >300 (*)    Nitrite POSITIVE (*)    Leukocytes,Ua LARGE (*)    All other components within normal limits  URINALYSIS, MICROSCOPIC (REFLEX) - Abnormal; Notable for the following components:   Bacteria, UA MANY (*)    All other components within normal limits  POC OCCULT BLOOD, ED - Abnormal; Notable for the following components:   Fecal Occult Bld POSITIVE (*)    All other components within normal limits  TYPE AND SCREEN    EKG EKG Interpretation  Date/Time:  Saturday February 16 2019 08:42:34 EDT Ventricular Rate:  69 PR Interval:    QRS Duration: 95 QT Interval:  412 QTC Calculation: 442 R Axis:   9 Text Interpretation:  Sinus rhythm Borderline prolonged PR interval No significant change since last tracing Confirmed by Fredia Sorrow [phone]) on 02/16/2019 9:53:53 AM   Radiology No results found.  Procedures Procedures (including critical care time)  Medications Ordered in ED Medications  0.9 %  sodium chloride infusion ( Intravenous New Bag/Given 02/16/19 [id number])     Initial Impression / Assessment and Plan / ED Course  I have reviewed the triage vital signs and the nursing notes.  Pertinent labs & imaging results that were available during my care of the patient were reviewed by me and  considered in my medical decision making (see chart for details).       Work-up here hemodynamically stable based on heart rate and blood pressure.  Hemoglobin not significantly abnormal abnormal.  Stool Hemoccult positive.  Urinalysis consistent with urinary tract infection.  Sent for culture and will treat with Cipro since has allergy to penicillins and cephalosporins.  Kidney function  worse than it was earlier this year.  So some component of acute kidney injury.  Will discuss with hospitalist for admission for observation to make sure the bleeding does not continue.  Also for continued treatment of the urinary tract infection.  And the acute kidney injury.   COVID testing ordered EKG showed sinus rhythm.   Final Clinical Impressions(s) / ED Diagnoses   Final diagnoses:  Gastrointestinal hemorrhage, unspecified gastrointestinal hemorrhage type  Acute cystitis with hematuria  AKI (acute kidney injury) [facility])    ED Discharge Orders    None       Fredia Sorrow, MD 02/16/19 1127    Fredia Sorrow, MD 02/16/19 1128   Hospitalist will admit.  Will contact GI medicine to give them a heads up about patient being admitted for GI bleed.  Patient still currently stable.   Fredia Sorrow, MD 02/16/19 1210  Dr. Buford Dresser gastroenterology made aware of the patient.    Fredia Sorrow, MD 02/16/19 1326

## 2019-02-16 NOTE — ED Notes (Addendum)
Pt had large amt of blood around rectum, very little stool obtained.

## 2019-02-16 NOTE — ED Triage Notes (Signed)
EMS reports pt has history of a fib.  Reports noticed bright red blood in stool this morning.  C/O lower abd pain.

## 2019-02-16 NOTE — H&P (Addendum)
Patient Demographics:    Demetrise Youngdahl, is a 85 y.o. female  MRN: [id number]   DOB - 07-04-1923  Admit Date - 02/16/2019  Outpatient Primary MD for the patient is [name], MD   Assessment & Plan:    Principal Problem:   GI bleed Active Problems:   Atrial fibrillation (HCC)   Anxiety   GERD (gastroesophageal reflux disease)   Long term (current) use of anticoagulants   Major depression    1) GI bleed/BRBPR----??? Hemorrhoids Vs Diverticular Bleed-check serial H&H, transfuse as clinically indicated, gentle hydration, clear liquid diet only -EDP apparently discussed case with on-call GI physician Dr. Gala Romney -Rectal exam in the ED suggest hemorrhoids please see photos in epic -Last colonoscopy more than 10 years ago, history of diverticulosis however  2)PAF- currently in sinus rhythm, hold Xarelto due to #1 above, continue Cardizem CD 120 mg daily and metoprolol XL 50 mg daily for rate control  3)H/o  GERD--continue PPI  4) presumed UTI-  continue Cipro pending cultures, patient is allergic to sulfa penicillin and cephalosporins  5) depression and anxiety--stable, continue Zoloft 50 mg daily, may use trazodone as needed for sleep,, Xanax as needed as ordered  6)Hypothyroidism--continue levothyroxine 75 mcg daily  7)AKI----acute kidney injury on CKD stage - II, worsening renal function is due to GI bleed with prerenal azotemia and decreased renal perfusion     creatinine on admission=1.73  ,   baseline creatinine = 1.2   ,   renally adjust medications, avoid nephrotoxic agents / dehydration  / hypotension -Hold torsemide, hold lisinopril, give gentle IV hydration   With History of - Reviewed by me  Past Medical History:  Diagnosis Date   Abdominal pain, chronic, right lower quadrant    "ever  since I had my appendix removed"   Atrial fibrillation ([place])    Breast cancer ([place])    Cancer ([place])    left side   Chronic back pain    Edema    GERD (gastroesophageal reflux disease)    Hyperlipidemia    Hypertension    Osteopenia    Osteoporosis    Ovarian cyst 02/2015   left   Recurrent abdominal pain    LLQ      Past Surgical History:  Procedure Laterality Date   APPENDECTOMY     APPENDECTOMY     BREAST SURGERY Left    lumpectomy   CARDIOVERSION N/A 03/31/2016   Procedure: CARDIOVERSION;  Surgeon: Josue Hector, MD;  Location: [facility];  Service: Cardiovascular;  Laterality: N/A;   COLON RESECTION     COLON SURGERY     HERNIA REPAIR     HERNIA REPAIR      Chief Complaint  Patient presents with   GI Bleeding      HPI:    Noor Branigan  is a 85 y.o. female with past medical history relevant for chronic atrial fibrillation on Xarelto for stroke prophylaxis, anxiety  and depressive disorder, GERD, H/o subarachnoid hemorrhage/ICH, hypertension who presents to the ED with concerns about BRBPR x multiple episodes (> 3) since 0630 am, first BM had very little stool and some abd pain........ subsequent episodes of BRBPR were w/o stool and painless  -Complains of fatigue, no chest pains no palpitations no significant dizziness no dyspnea on exertion  No nausea or vomiting,   No fever  Or chills   In ED--stool occult blood is positive, patient with bright red blood per day -Creatinine is up to 1.73 from a baseline around 1.2 BUN is up to 36 -UA suggestive of UTI -Hemoglobin is down to 10.5 with a baseline usually between 11 and 12  -EDP apparently discussed case with on-call GI physician Dr. Gala Romney -Rectal exam in the ED suggest hemorrhoids please see photos in epic  --Last colonoscopy more than 10 years ago, history of diverticulosis however    Review of systems:    In addition to the HPI above,   A full Review of  Systems was done, all other  systems reviewed are negative except as noted above in HPI , .   Social History:  Reviewed by me    Social History   Tobacco Use   Smoking status: Never Smoker   Smokeless tobacco: Never Used  Substance Use Topics   Alcohol use: No    Alcohol/week: 0.0 standard drinks     Family History :  Reviewed by me    Family History  Problem Relation Age of Onset   Diabetes Mother    Heart disease Other    Arthritis Other    Cancer Other    Hyperlipidemia Brother     Home Medications:   Prior to Admission medications   Medication Sig Start Date End Date Taking? Authorizing Provider  acetaminophen (TYLENOL) 325 MG tablet Take 650 mg by mouth 2 (two) times daily.    Yes [provider]  ALPRAZolam (XANAX) 0.25 MG tablet TAKE 1 TABLET BY MOUTH TWICE DAILY AS NEEDED FOR ANXIETY OR SLEEP. Patient taking differently: Take 0.25 mg by mouth 2 (two) times daily as needed for anxiety or sleep.  10/18/18  Yes [name], MD  calcium carbonate (TUMS - DOSED IN MG ELEMENTAL CALCIUM) 500 MG chewable tablet Chew 1 tablet by mouth daily.    Yes [provider]  diltiazem (CARDIZEM CD) 120 MG 24 hr capsule TAKE (1) CAPSULE BY MOUTH ONCE DAILY. Patient taking differently: Take 120 mg by mouth daily.  09/17/18  Yes Satira Sark, MD  HYDROcodone-acetaminophen (NORCO/VICODIN) 5-325 MG tablet 1/2 tablet every 12 hours as needed use sparingly Patient taking differently: Take 0.5 tablets by mouth every 12 (twelve) hours as needed for moderate pain. 1/2 tablet every 12 hours as needed use sparingly 10/17/18  Yes Luking, Elayne Snare, MD  levothyroxine (SYNTHROID) 75 MCG tablet Take 1 tablet (75 mcg total) by mouth daily. 12/21/18  Yes Luking, Elayne Snare, MD  lisinopril (ZESTRIL) 2.5 MG tablet Take 1 tablet (2.5 mg total) by mouth daily. 12/19/18  Yes [name], MD  metoprolol succinate (TOPROL-XL) 50 MG 24 hr tablet Take 1 tablet (50 mg total) by mouth daily. Take with or  immediately following a meal. 03/06/18  Yes Luking, Elayne Snare, MD  Multiple Vitamin (MULTIVITAMIN) tablet Take 1 tablet by mouth daily.   Yes [provider]  ondansetron (ZOFRAN-ODT) 4 MG disintegrating tablet Take 1 tablet (4 mg total) by mouth every 8 (eight) hours as needed for  nausea or vomiting. 06/22/18  Yes Davonna Belling, MD  pantoprazole (PROTONIX) 40 MG tablet Take 1 tablet (40 mg total) by mouth daily. 12/21/18  Yes [name], MD  potassium chloride (K-DUR) 10 MEQ tablet Take 1 tablet (10 mEq total) by mouth daily. 12/21/18  Yes [name], MD  sertraline (ZOLOFT) 50 MG tablet Take 1 tablet (50 mg total) by mouth daily. 12/21/18  Yes Luking, Elayne Snare, MD  torsemide (DEMADEX) 20 MG tablet TAKE 2 TABLETS DAILY EVERY MORNING. Patient taking differently: Take 40 mg by mouth every morning.  12/21/18  Yes Luking, Elayne Snare, MD  vitamin B-12 1000 MCG tablet Take 1 tablet (1,000 mcg total) by mouth daily. 07/21/17  Yes Costello, Mary A, NP  XARELTO 20 MG TABS tablet TAKE 1 TABLET BY MOUTH ONCE DAILY WITH SUPPER. Patient taking differently: Take 20 mg by mouth daily with supper.  10/18/18  Yes [name], MD     Allergies:     Allergies  Allergen Reactions   Amiodarone Itching   Amoxicillin Hives   Cephalosporins Itching   Lipitor [Atorvastatin] Other (See Comments)    Unknown:patient is not aware of this allergy   Lorazepam Other (See Comments)    Exceptional fatigue    Lovenox [Enoxaparin Sodium] Nausea And Vomiting   Morphine And Related Itching   Penicillins Swelling    Has patient had a PCN reaction causing immediate rash, facial/tongue/throat swelling, SOB or lightheadedness with hypotension: unknown Has patient had a PCN reaction causing severe rash involving mucus membranes or skin necrosis: unknown Has patient had a PCN reaction that required hospitalization No Has patient had a PCN reaction occurring within the last 10 years: no If all of the above  answers are "NO", then may proceed with Cephalosporin use.    Sulfa Antibiotics Swelling     Physical Exam:   Vitals  Blood pressure 105/89, pulse 82, temperature 98.8 F (37.1 C), temperature source Oral, resp. rate (!) 28, weight 61.2 kg, SpO2 96 %.  Physical Examination: General appearance - alert, well appearing, and in no distress  Mental status - alert, oriented to person, place, and time,  Eyes - sclera anicteric Neck - supple, no JVD elevation , Chest - clear  to auscultation bilaterally, symmetrical air movement,  Heart - S1 and S2 normal, regular  Abdomen - soft, nontender, nondistended, no masses or organomegaly Neurological - screening mental status exam normal, neck supple without rigidity, cranial nerves II through XII intact, DTR's normal and symmetric Extremities - no pedal edema noted, intact peripheral pulses  Skin - warm, dry     Data Review:    CBC Recent Labs  Lab 02/16/19 0858 02/16/19 1513  WBC 7.2  --   HGB 11.0* 10.5*  HCT 35.1* 33.3*  PLT 242  --   MCV 92.4  --   MCH 28.9  --   MCHC 31.3  --   RDW 12.2  --   LYMPHSABS 2.2  --   MONOABS 0.6  --   EOSABS 0.2  --   BASOSABS 0.0  --    ------------------------------------------------------------------------------------------------------------------  Chemistries  Recent Labs  Lab 02/16/19 0858  NA 142  K 3.8  CL 107  CO2 26  GLUCOSE 130*  BUN 36*  CREATININE 1.73*  CALCIUM 8.8*  AST 21  ALT 14  ALKPHOS 57  BILITOT 0.7   ------------------------------------------------------------------------------------------------------------------ CrCl cannot be calculated (Unknown ideal weight.). ------------------------------------------------------------------------------------------------------------------ No results for input(s): TSH, T4TOTAL, T3FREE, THYROIDAB in  the last 72 hours.  Invalid input(s): FREET3   Coagulation profile No results for input(s): INR, PROTIME in the last 168  hours. ------------------------------------------------------------------------------------------------------------------- No results for input(s): DDIMER in the last 72 hours. -------------------------------------------------------------------------------------------------------------------  Cardiac Enzymes No results for input(s): CKMB, TROPONINI, MYOGLOBIN in the last 168 hours.  Invalid input(s): CK ------------------------------------------------------------------------------------------------------------------    Component Value Date/Time   BNP 199.0 (H) 10/11/2017 0948   ---------------------------------------------------------------------------------------------------------------  Urinalysis    Component Value Date/Time   COLORURINE RED (A) 02/16/2019 0959   APPEARANCEUR HAZY (A) 02/16/2019 0959   LABSPEC 1.015 02/16/2019 0959   PHURINE 8.0 02/16/2019 0959   GLUCOSEU 100 (A) 02/16/2019 0959   HGBUR LARGE (A) 02/16/2019 0959   BILIRUBINUR MODERATE (A) 02/16/2019 0959   BILIRUBINUR + 12/20/2012 1107   KETONESUR 15 (A) 02/16/2019 0959   PROTEINUR >300 (A) 02/16/2019 0959   UROBILINOGEN 0.2 04/04/2015 1018   NITRITE POSITIVE (A) 02/16/2019 0959   LEUKOCYTESUR LARGE (A) 02/16/2019 0959   ----------------------------------------------------------------------------------------------------------------   Imaging Results:    No results found.  Radiological Exams on Admission: No results found.  DVT Prophylaxis -SCD  (Gi Bleed) AM Labs Ordered, also please review Full Orders  Family Communication: Admission, patients condition and plan of care including tests being ordered have been discussed with the patient who indicate understanding and agree with the plan   Code Status - Full Code  Likely DC to  home  Condition   stable  Roxan Hockey M.D on 02/16/2019 at 6:56 PM Go to [email or web address] -  for contact info  [facility] - Office  [phone]

## 2019-02-17 ENCOUNTER — Other Ambulatory Visit: Payer: Self-pay

## 2019-02-17 DIAGNOSIS — F419 Anxiety disorder, unspecified: Secondary | ICD-10-CM | POA: Diagnosis present

## 2019-02-17 DIAGNOSIS — K219 Gastro-esophageal reflux disease without esophagitis: Secondary | ICD-10-CM | POA: Diagnosis present

## 2019-02-17 DIAGNOSIS — I129 Hypertensive chronic kidney disease with stage 1 through stage 4 chronic kidney disease, or unspecified chronic kidney disease: Secondary | ICD-10-CM | POA: Diagnosis present

## 2019-02-17 DIAGNOSIS — D62 Acute posthemorrhagic anemia: Secondary | ICD-10-CM | POA: Diagnosis not present

## 2019-02-17 DIAGNOSIS — I4819 Other persistent atrial fibrillation: Secondary | ICD-10-CM | POA: Diagnosis not present

## 2019-02-17 DIAGNOSIS — M810 Age-related osteoporosis without current pathological fracture: Secondary | ICD-10-CM | POA: Diagnosis present

## 2019-02-17 DIAGNOSIS — B961 Klebsiella pneumoniae [K. pneumoniae] as the cause of diseases classified elsewhere: Secondary | ICD-10-CM | POA: Diagnosis present

## 2019-02-17 DIAGNOSIS — Z888 Allergy status to other drugs, medicaments and biological substances status: Secondary | ICD-10-CM | POA: Diagnosis not present

## 2019-02-17 DIAGNOSIS — Z853 Personal history of malignant neoplasm of breast: Secondary | ICD-10-CM | POA: Diagnosis not present

## 2019-02-17 DIAGNOSIS — Z20828 Contact with and (suspected) exposure to other viral communicable diseases: Secondary | ICD-10-CM | POA: Diagnosis present

## 2019-02-17 DIAGNOSIS — Z881 Allergy status to other antibiotic agents status: Secondary | ICD-10-CM | POA: Diagnosis not present

## 2019-02-17 DIAGNOSIS — K922 Gastrointestinal hemorrhage, unspecified: Secondary | ICD-10-CM | POA: Diagnosis present

## 2019-02-17 DIAGNOSIS — I480 Paroxysmal atrial fibrillation: Secondary | ICD-10-CM | POA: Diagnosis present

## 2019-02-17 DIAGNOSIS — N182 Chronic kidney disease, stage 2 (mild): Secondary | ICD-10-CM | POA: Diagnosis present

## 2019-02-17 DIAGNOSIS — E538 Deficiency of other specified B group vitamins: Secondary | ICD-10-CM | POA: Diagnosis present

## 2019-02-17 DIAGNOSIS — Z882 Allergy status to sulfonamides status: Secondary | ICD-10-CM | POA: Diagnosis not present

## 2019-02-17 DIAGNOSIS — E039 Hypothyroidism, unspecified: Secondary | ICD-10-CM | POA: Diagnosis present

## 2019-02-17 DIAGNOSIS — N3001 Acute cystitis with hematuria: Secondary | ICD-10-CM | POA: Diagnosis present

## 2019-02-17 DIAGNOSIS — D6832 Hemorrhagic disorder due to extrinsic circulating anticoagulants: Secondary | ICD-10-CM | POA: Diagnosis present

## 2019-02-17 DIAGNOSIS — E785 Hyperlipidemia, unspecified: Secondary | ICD-10-CM | POA: Diagnosis present

## 2019-02-17 DIAGNOSIS — K642 Third degree hemorrhoids: Secondary | ICD-10-CM | POA: Diagnosis present

## 2019-02-17 DIAGNOSIS — Z7901 Long term (current) use of anticoagulants: Secondary | ICD-10-CM | POA: Diagnosis not present

## 2019-02-17 DIAGNOSIS — K625 Hemorrhage of anus and rectum: Secondary | ICD-10-CM | POA: Diagnosis not present

## 2019-02-17 DIAGNOSIS — T45515A Adverse effect of anticoagulants, initial encounter: Secondary | ICD-10-CM | POA: Diagnosis present

## 2019-02-17 DIAGNOSIS — N179 Acute kidney failure, unspecified: Secondary | ICD-10-CM | POA: Diagnosis present

## 2019-02-17 DIAGNOSIS — K5791 Diverticulosis of intestine, part unspecified, without perforation or abscess with bleeding: Secondary | ICD-10-CM | POA: Diagnosis present

## 2019-02-17 DIAGNOSIS — F329 Major depressive disorder, single episode, unspecified: Secondary | ICD-10-CM | POA: Diagnosis present

## 2019-02-17 LAB — BASIC METABOLIC PANEL
Anion gap: 6 (ref 5–15)
BUN: 20 mg/dL (ref 8–23)
CO2: 22 mmol/L (ref 22–32)
Calcium: 8.6 mg/dL — ABNORMAL LOW (ref 8.9–10.3)
Chloride: 115 mmol/L — ABNORMAL HIGH (ref 98–111)
Creatinine, Ser: 1.23 mg/dL — ABNORMAL HIGH (ref 0.44–1.00)
GFR calc Af Amer: 44 mL/min — ABNORMAL LOW (ref >60)
GFR calc non Af Amer: 38 mL/min — ABNORMAL LOW (ref >60)
Glucose, Bld: 100 mg/dL — ABNORMAL HIGH (ref 70–99)
Potassium: 4 mmol/L (ref 3.5–5.1)
Sodium: 143 mmol/L (ref 135–145)

## 2019-02-17 LAB — CBC
HCT: 31.5 % — ABNORMAL LOW (ref 36.0–46.0)
Hemoglobin: 9.8 g/dL — ABNORMAL LOW (ref 12.0–15.0)
MCH: 28.9 pg (ref 26.0–34.0)
MCHC: 31.1 g/dL (ref 30.0–36.0)
MCV: 92.9 fL (ref 80.0–100.0)
Platelets: 224 10*3/uL (ref 150–400)
RBC: 3.39 MIL/uL — ABNORMAL LOW (ref 3.87–5.11)
RDW: 12.3 % (ref 11.5–15.5)
WBC: 7.2 10*3/uL (ref 4.0–10.5)
nRBC: 0 % (ref 0.0–0.2)

## 2019-02-17 LAB — HEMOGLOBIN AND HEMATOCRIT, BLOOD
HCT: 30.3 % — ABNORMAL LOW (ref 36.0–46.0)
Hemoglobin: 9.6 g/dL — ABNORMAL LOW (ref 12.0–15.0)

## 2019-02-17 MED ORDER — PHENAZOPYRIDINE HCL 100 MG PO TABS
200.0000 mg | ORAL_TABLET | Freq: Once | ORAL | Status: AC
  Administered 2019-02-17: 200 mg via ORAL
  Filled 2019-02-17: qty 2

## 2019-02-17 MED ORDER — HYDROCORTISONE ACETATE 25 MG RE SUPP
25.0000 mg | Freq: Two times a day (BID) | RECTAL | Status: DC
  Administered 2019-02-17 (×2): 25 mg via RECTAL
  Filled 2019-02-17 (×3): qty 1

## 2019-02-17 MED ORDER — DEXTROSE-NACL 5-0.45 % IV SOLN
INTRAVENOUS | Status: DC
  Administered 2019-02-17: 18:00:00 via INTRAVENOUS

## 2019-02-17 NOTE — Progress Notes (Signed)
Patient Demographics:    [name], is a 85 y.o. female, DOB - 01/11/1924, [id number]  Admit date - 02/16/2019   Admitting Physician Ivar Domangue Denton Brick, MD  Outpatient Primary MD for the patient is [name], MD  LOS - 0   Chief Complaint  Patient presents with  . GI Bleeding        Subjective:    [name] today has no fevers, no emesis,  No chest pain, episodes of BRBPR persist on and off, no significant abdominal pain, no melena  -Patient with fatigue, very mild dyspnea on exertion and dizziness -Complains of dysuria which improved with Pyridium  Assessment  & Plan :    Principal Problem:   GI bleed Active Problems:   Atrial fibrillation (HCC)   Anxiety   GERD (gastroesophageal reflux disease)   Long term (current) use of anticoagulants   Major depression   GIB (gastrointestinal bleeding)  Brief summary -85 y.o. female with past medical history relevant for chronic atrial fibrillation on Xarelto for stroke prophylaxis, anxiety and depressive disorder, GERD, H/o subarachnoid hemorrhage/ICH, hypertension admitted on 02/16/2019 with BRBPR  A/p  1) Presumed lower GI bleed/BRBPR----??? Hemorrhoids Vs Diverticular Bleed- GI physician Dr. Gala Romney consult appreciated, patient declines colonoscopy at this time --Rectal exam in the ED suggest hemorrhoids please see photos in epic -Last colonoscopy more than 10 years ago,  patient has history of diverticulosis  -Anusol HC for presumed hemorrhoids-ordered by GI physician -Per GI physician okay to continue clear liquid diet for now  2) acute blood loss symptomatic anemia--- secondary to #1 above, baseline hemoglobin usually close to 12, admission hemoglobin was 11.0, hemoglobin currently around 9.4. --Patient with fatigue, very mild dyspnea on exertion and dizziness  3)PAF- currently in sinus rhythm, hold Xarelto due to #1 above,  continue Cardizem CD 120 mg daily and metoprolol XL 50 mg daily for rate control -We will hold Xarelto for at least 5 days  4) presumed UTI-  continue Cipro pending cultures, patient is allergic to sulfa penicillin and cephalosporins -Patient had dysuria improved with Pyridium  5) depression and anxiety--stable, continue Zoloft 50 mg daily, may use trazodone as needed for sleep,, Xanax as needed as ordered  6)Hypothyroidism--continue levothyroxine 75 mcg daily  7)AKI----acute kidney injury on CKD stage - II, worsening renal function is due to GI bleed with prerenal azotemia and decreased renal perfusion, creatinine on admission=1.73  ,   baseline creatinine = 1.2   , creatinine with hydration is down to 1.23,  renally adjust medications, avoid nephrotoxic agents / dehydration  / hypotension -Continue to hold torsemide, hold lisinopril,  continue gentle IV hydration   Disposition/Need for in-Hospital Stay- patient unable to be discharged at this time due to symptomatic anemia due to lower GI bleed- Patient with fatigue, very mild dyspnea on exertion and dizziness--hemoglobin down to 9.4 from 11 on admission and his preadmission baseline close to 12  Code Status : Full  Family Communication:   NA (patient is alert, awake and coherent)  Disposition Plan  : TBD  Consults  :  Gi  DVT Prophylaxis  :   - SCDs   Lab Results  Component Value Date   PLT 224 02/17/2019   Inpatient Medications  Scheduled Meds: .  acetaminophen  650 mg Oral BID  . calcium carbonate  1 tablet Oral Daily  . diltiazem  120 mg Oral Daily  . hydrocortisone  25 mg Rectal BID  . levothyroxine  75 mcg Oral Q0600  . metoprolol succinate  50 mg Oral Daily  . multivitamin with minerals  1 tablet Oral Daily  . pantoprazole (PROTONIX) IV  40 mg Intravenous Q12H  . potassium chloride  10 mEq Oral Daily  . sertraline  50 mg Oral Daily  . sodium chloride flush  3 mL Intravenous Q12H  . cyanocobalamin  1,000 mcg Oral  Daily   Continuous Infusions: . sodium chloride    . ciprofloxacin 200 mg (02/17/19 1205)  . dextrose 5 % and 0.45% NaCl     PRN Meds:.sodium chloride, acetaminophen **OR** acetaminophen, albuterol, ALPRAZolam, ondansetron **OR** ondansetron (ZOFRAN) IV, polyethylene glycol, sodium chloride flush, traZODone    Anti-infectives (From admission, onward)   Start     Dose/Rate Route Frequency Ordered Stop   02/17/19 1100  ciprofloxacin (CIPRO) IVPB 200 mg     200 mg 100 mL/hr over 60 Minutes Intravenous Every 24 hours 02/16/19 1925     02/16/19 1130  ciprofloxacin (CIPRO) IVPB 400 mg     400 mg 200 mL/hr over 60 Minutes Intravenous  Once 02/16/19 1123 02/16/19 1234        Objective:   Vitals:   02/17/19 0300 02/17/19 0553 02/17/19 0901 02/17/19 1302  BP:  (!) 115/50 (!) 141/58 (!) 144/56  Pulse:  62 63 63  Resp:  18 18 18   Temp:  98.4 F (36.9 C)  98.4 F (36.9 C)  TempSrc:  Oral    SpO2:  97% 99% 95%  Weight: 68.4 kg     Height: 5\' 4"  (1.626 m)       Wt Readings from Last 3 Encounters:  02/17/19 68.4 kg  06/22/18 60.8 kg  05/29/18 61.7 kg     Intake/Output Summary (Last 24 hours) at 02/17/2019 1712 Last data filed at 02/17/2019 [id number] Gross per 24 hour  Intake 1935.38 ml  Output -  Net 1935.38 ml   Physical Exam  Gen:- Awake Alert, in no apparent distress  HEENT:- [place].AT, No sclera icterus Neck-Supple Neck,No JVD,.  Lungs-  CTAB , fair symmetrical air movement CV- S1, S2 normal, regular  Abd-  +ve B.Sounds, Abd Soft, No significant tenderness,    Extremity/Skin:- No  edema, pedal pulses present  Psych-affect is appropriate, oriented x3 Neuro-no new focal deficits, no tremors Rectal Exam--hemorrhoids, please see photos in epic   Data Review:   Micro Results Recent Results (from the past 240 hour(s))  SARS Coronavirus 2 [facility] order, Performed in [facility] hospital lab) Nasopharyngeal Nasopharyngeal Swab     Status: None   Collection Time: 02/16/19  12:33 PM   Specimen: Nasopharyngeal Swab  Result Value Ref Range Status   SARS Coronavirus 2 NEGATIVE NEGATIVE Final    Comment: (NOTE) If result is NEGATIVE SARS-CoV-2 target nucleic acids are NOT DETECTED. The SARS-CoV-2 RNA is generally detectable in upper and lower  respiratory specimens during the acute phase of infection. The lowest  concentration of SARS-CoV-2 viral copies this assay can detect is 250  copies / mL. A negative result does not preclude SARS-CoV-2 infection  and should not be used as the sole basis for treatment or other  patient management decisions.  A negative result may occur with  improper specimen collection / handling, submission of specimen other  than nasopharyngeal swab,  presence of viral mutation(s) within the  areas targeted by this assay, and inadequate number of viral copies  (<250 copies / mL). A negative result must be combined with clinical  observations, patient history, and epidemiological information. If result is POSITIVE SARS-CoV-2 target nucleic acids are DETECTED. The SARS-CoV-2 RNA is generally detectable in upper and lower  respiratory specimens dur ing the acute phase of infection.  Positive  results are indicative of active infection with SARS-CoV-2.  Clinical  correlation with patient history and other diagnostic information is  necessary to determine patient infection status.  Positive results do  not rule out bacterial infection or co-infection with other viruses. If result is PRESUMPTIVE POSTIVE SARS-CoV-2 nucleic acids MAY BE PRESENT.   A presumptive positive result was obtained on the submitted specimen  and confirmed on repeat testing.  While 2019 novel coronavirus  (SARS-CoV-2) nucleic acids may be present in the submitted sample  additional confirmatory testing may be necessary for epidemiological  and / or clinical management purposes  to differentiate between  SARS-CoV-2 and other Sarbecovirus currently known to infect  humans.  If clinically indicated additional testing with an alternate test  methodology [id number]) is advised. The SARS-CoV-2 RNA is generally  detectable in upper and lower respiratory sp ecimens during the acute  phase of infection. The expected result is Negative. Fact Sheet for Patients:  StrictlyIdeas.no Fact Sheet for Healthcare Providers: BankingDealers.co.za This test is not yet approved or cleared by the [place] FDA and has been authorized for detection and/or diagnosis of SARS-CoV-2 by FDA under an Emergency Use Authorization (EUA).  This EUA will remain in effect (meaning this test can be used) for the duration of the COVID-19 declaration under Section 564(b)(1) of the Act, 21 U.S.C. section 360bbb-3(b)(1), unless the authorization is terminated or revoked sooner. Performed at [facility], [address] [place], [place] [address]     Radiology Reports No results found.   CBC Recent Labs  Lab 02/16/19 0858 02/16/19 1513 02/16/19 2227 02/17/19 0639 02/17/19 1449  WBC 7.2  --   --  7.2  --   HGB 11.0* 10.5* 9.4* 9.8* 9.6*  HCT 35.1* 33.3* 29.8* 31.5* 30.3*  PLT 242  --   --  224  --   MCV 92.4  --   --  92.9  --   MCH 28.9  --   --  28.9  --   MCHC 31.3  --   --  31.1  --   RDW 12.2  --   --  12.3  --   LYMPHSABS 2.2  --   --   --   --   MONOABS 0.6  --   --   --   --   EOSABS 0.2  --   --   --   --   BASOSABS 0.0  --   --   --   --     Chemistries  Recent Labs  Lab 02/16/19 0858 02/17/19 0639  NA 142 143  K 3.8 4.0  CL 107 115*  CO2 26 22  GLUCOSE 130* 100*  BUN 36* 20  CREATININE 1.73* 1.23*  CALCIUM 8.8* 8.6*  AST 21  --   ALT 14  --   ALKPHOS 57  --   BILITOT 0.7  --    ------------------------------------------------------------------------------------------------------------------ No results for input(s): CHOL, HDL, LDLCALC, TRIG, CHOLHDL, LDLDIRECT in the last 72 hours.  Lab Results   Component Value Date   HGBA1C 6.0 (H)  07/18/2017   ------------------------------------------------------------------------------------------------------------------ No results for input(s): TSH, T4TOTAL, T3FREE, THYROIDAB in the last 72 hours.  Invalid input(s): FREET3 ------------------------------------------------------------------------------------------------------------------ No results for input(s): VITAMINB12, FOLATE, FERRITIN, TIBC, IRON, RETICCTPCT in the last 72 hours.  Coagulation profile No results for input(s): INR, PROTIME in the last 168 hours.  No results for input(s): DDIMER in the last 72 hours.  Cardiac Enzymes No results for input(s): CKMB, TROPONINI, MYOGLOBIN in the last 168 hours.  Invalid input(s): CK ------------------------------------------------------------------------------------------------------------------    Component Value Date/Time   BNP 199.0 (H) 10/11/2017 [id number]     Roxan Hockey M.D on 02/17/2019 at 5:12 PM  Go to [email or web address] - for contact info  [facility] - Office  [phone]

## 2019-02-17 NOTE — Plan of Care (Signed)

## 2019-02-17 NOTE — Consult Note (Signed)
Referring Provider: No ref. provider found Primary Care Physician:  Katherine Drown, MD Primary Gastroenterologist:  Dr.Jermichael Belmares  Reason for Consultation: Rectal bleeding  HPI:   Very pleasant 85 year old lady on chronic Xarelto for atrial fibrillation admitted to the hospital with multiple episodes of painless rectal bleeding.  Has remained hemodynamically stable since yesterday.  Has a moderate degree of grade 3 hemorrhoids on examination in the ED (see photos in the record).  Admitting hemoglobin 11.0; today 9.8.  No upper GI tract symptoms such as odynophagia, dysphagia early satiety nausea or vomiting.  There is been no melena.  She denies constipation or diarrhea has 1 bowel movement daily. Last colonoscopy unknown if previously performed was over 20 years ago as patient reports.  Takes Protonix 40 mg daily with apparent excellent control of GERD. GI history is significant for complicated  diverticulitis with abscess requiring segmental resection by Dr. Irving Shows many years ago with colostomy and subsequent takedown.  Short time later she had a laparotomy for gangrenous appendicitis.  No family history of colorectal neoplasia or other GI malignancy.  Past Medical History:  Diagnosis Date  . Abdominal pain, chronic, right lower quadrant    "ever since I had my appendix removed"  . Atrial fibrillation ([place])   . Breast cancer ([place])   . Cancer ([place])    left side  . Chronic back pain   . Edema   . GERD (gastroesophageal reflux disease)   . Hyperlipidemia   . Hypertension   . Osteopenia   . Osteoporosis   . Ovarian cyst 02/2015   left  . Recurrent abdominal pain    LLQ    Past Surgical History:  Procedure Laterality Date  . APPENDECTOMY    . APPENDECTOMY    . BREAST SURGERY Left    lumpectomy  . CARDIOVERSION N/A 03/31/2016   Procedure: CARDIOVERSION;  Surgeon: Josue Hector, MD;  Location: [facility];  Service: Cardiovascular;  Laterality: N/A;  . COLON RESECTION    .  COLON SURGERY    . HERNIA REPAIR    . HERNIA REPAIR      Prior to Admission medications   Medication Sig Start Date End Date Taking? Authorizing Provider  acetaminophen (TYLENOL) 325 MG tablet Take 650 mg by mouth 2 (two) times daily.    Yes [provider]  ALPRAZolam (XANAX) 0.25 MG tablet TAKE 1 TABLET BY MOUTH TWICE DAILY AS NEEDED FOR ANXIETY OR SLEEP. Patient taking differently: Take 0.25 mg by mouth 2 (two) times daily as needed for anxiety or sleep.  10/18/18  Yes Katherine Drown, MD  calcium carbonate (TUMS - DOSED IN MG ELEMENTAL CALCIUM) 500 MG chewable tablet Chew 1 tablet by mouth daily.    Yes [provider]  diltiazem (CARDIZEM CD) 120 MG 24 hr capsule TAKE (1) CAPSULE BY MOUTH ONCE DAILY. Patient taking differently: Take 120 mg by mouth daily.  09/17/18  Yes Satira Sark, MD  HYDROcodone-acetaminophen (NORCO/VICODIN) 5-325 MG tablet 1/2 tablet every 12 hours as needed use sparingly Patient taking differently: Take 0.5 tablets by mouth every 12 (twelve) hours as needed for moderate pain. 1/2 tablet every 12 hours as needed use sparingly 10/17/18  Yes Luking, Elayne Snare, MD  levothyroxine (SYNTHROID) 75 MCG tablet Take 1 tablet (75 mcg total) by mouth daily. 12/21/18  Yes Luking, Elayne Snare, MD  lisinopril (ZESTRIL) 2.5 MG tablet Take 1 tablet (2.5 mg total) by mouth daily. 12/19/18  Yes Luking, Elayne Snare,  MD  metoprolol succinate (TOPROL-XL) 50 MG 24 hr tablet Take 1 tablet (50 mg total) by mouth daily. Take with or immediately following a meal. 03/06/18  Yes Luking, Elayne Snare, MD  Multiple Vitamin (MULTIVITAMIN) tablet Take 1 tablet by mouth daily.   Yes [provider]  ondansetron (ZOFRAN-ODT) 4 MG disintegrating tablet Take 1 tablet (4 mg total) by mouth every 8 (eight) hours as needed for nausea or vomiting. 06/22/18  Yes Davonna Belling, MD  pantoprazole (PROTONIX) 40 MG tablet Take 1 tablet (40 mg total) by mouth daily. 12/21/18  Yes Katherine Drown, MD   potassium chloride (K-DUR) 10 MEQ tablet Take 1 tablet (10 mEq total) by mouth daily. 12/21/18  Yes Katherine Drown, MD  sertraline (ZOLOFT) 50 MG tablet Take 1 tablet (50 mg total) by mouth daily. 12/21/18  Yes Luking, Elayne Snare, MD  torsemide (DEMADEX) 20 MG tablet TAKE 2 TABLETS DAILY EVERY MORNING. Patient taking differently: Take 40 mg by mouth every morning.  12/21/18  Yes Luking, Elayne Snare, MD  vitamin B-12 1000 MCG tablet Take 1 tablet (1,000 mcg total) by mouth daily. 07/21/17  Yes Costello, Mary A, NP  XARELTO 20 MG TABS tablet TAKE 1 TABLET BY MOUTH ONCE DAILY WITH SUPPER. Patient taking differently: Take 20 mg by mouth daily with supper.  10/18/18  Yes Katherine Drown, MD    Current Facility-Administered Medications  Medication Dose Route Frequency Provider Last Rate Last Dose  . 0.9 %  sodium chloride infusion   Intravenous Continuous Roxan Hockey, MD 50 mL/hr at 02/17/19 0856    . 0.9 %  sodium chloride infusion  250 mL Intravenous PRN Emokpae, Courage, MD      . acetaminophen (TYLENOL) tablet 650 mg  650 mg Oral Q6H PRN Emokpae, Courage, MD       Or  . acetaminophen (TYLENOL) suppository 650 mg  650 mg Rectal Q6H PRN Emokpae, Courage, MD      . acetaminophen (TYLENOL) tablet 650 mg  650 mg Oral BID Denton Brick, Courage, MD   650 mg at 02/17/19 0901  . albuterol (PROVENTIL) (2.5 MG/3ML) 0.083% nebulizer solution 2.5 mg  2.5 mg Nebulization Q2H PRN Emokpae, Courage, MD      . ALPRAZolam Duanne Moron) tablet 0.25 mg  0.25 mg Oral BID PRN Roxan Hockey, MD   0.125 mg at 02/16/19 2148  . calcium carbonate (TUMS - dosed in mg elemental calcium) chewable tablet 200 mg of elemental calcium  1 tablet Oral Daily Emokpae, Courage, MD   200 mg of elemental calcium at 02/17/19 0901  . ciprofloxacin (CIPRO) IVPB 200 mg  200 mg Intravenous Q24H Emokpae, Courage, MD      . diltiazem (CARDIZEM CD) 24 hr capsule 120 mg  120 mg Oral Daily Emokpae, Courage, MD   120 mg at 02/17/19 0902  . levothyroxine  (SYNTHROID) tablet 75 mcg  75 mcg Oral Q0600 Roxan Hockey, MD   75 mcg at 02/17/19 0619  . metoprolol succinate (TOPROL-XL) 24 hr tablet 50 mg  50 mg Oral Daily Emokpae, Courage, MD   50 mg at 02/17/19 0902  . multivitamin with minerals tablet 1 tablet  1 tablet Oral Daily Roxan Hockey, MD   1 tablet at 02/16/19 2129  . ondansetron (ZOFRAN) tablet 4 mg  4 mg Oral Q6H PRN Emokpae, Courage, MD       Or  . ondansetron (ZOFRAN) injection 4 mg  4 mg Intravenous Q6H PRN Roxan Hockey, MD      .  pantoprazole (PROTONIX) injection 40 mg  40 mg Intravenous Q12H Emokpae, Courage, MD   40 mg at 02/16/19 2135  . phenazopyridine (PYRIDIUM) tablet 200 mg  200 mg Oral Once Emokpae, Courage, MD      . polyethylene glycol (MIRALAX / GLYCOLAX) packet 17 g  17 g Oral Daily PRN Emokpae, Courage, MD      . potassium chloride (K-DUR) CR tablet 10 mEq  10 mEq Oral Daily Emokpae, Courage, MD   10 mEq at 02/17/19 0902  . sertraline (ZOLOFT) tablet 50 mg  50 mg Oral Daily Emokpae, Courage, MD   50 mg at 02/17/19 0902  . sodium chloride flush (NS) 0.9 % injection 3 mL  3 mL Intravenous Q12H Emokpae, Courage, MD   3 mL at 02/16/19 2128  . sodium chloride flush (NS) 0.9 % injection 3 mL  3 mL Intravenous PRN Emokpae, Courage, MD      . traZODone (DESYREL) tablet 50 mg  50 mg Oral QHS PRN Emokpae, Courage, MD      . vitamin B-12 (CYANOCOBALAMIN) tablet 1,000 mcg  1,000 mcg Oral Daily Emokpae, Courage, MD   1,000 mcg at 02/17/19 0901    Allergies as of 02/16/2019 - Review Complete 02/16/2019  Allergen Reaction Noted  . Amiodarone Itching 12/12/2015  . Amoxicillin Hives 09/22/2012  . Cephalosporins Itching 01/13/2011  . Lipitor [atorvastatin] Other (See Comments) 09/22/2012  . Lorazepam Other (See Comments) 08/04/2014  . Lovenox [enoxaparin sodium] Nausea And Vomiting 09/22/2012  . Morphine and related Itching 08/02/2010  . Penicillins Swelling 08/02/2010  . Sulfa antibiotics Swelling 08/02/2010    Family  History  Problem Relation Age of Onset  . Diabetes Mother   . Heart disease Other   . Arthritis Other   . Cancer Other   . Hyperlipidemia Brother    Social history: Widowed x28 years.  Has 1 child one grandchild.  Lives down the street from the hospital.  Lives lives alone.  Has caregivers coming twice a week to help her with housework.  No tobacco or alcohol in her history.  Still has a current driver's license -renewed last year although patient is afraid to drive for fear of "others hitting her".    Social History   Socioeconomic History  . Marital status: Widowed    Spouse name: Not on file  . Number of children: 1  . Years of education: Not on file  . Highest education level: Not on file  Occupational History    Employer: RETIRED  Social Needs  . Financial resource strain: Not on file  . Food insecurity    Worry: Not on file    Inability: Not on file  . Transportation needs    Medical: Not on file    Non-medical: Not on file  Tobacco Use  . Smoking status: Never Smoker  . Smokeless tobacco: Never Used  Substance and Sexual Activity  . Alcohol use: No    Alcohol/week: 0.0 standard drinks  . Drug use: No  . Sexual activity: Never  Lifestyle  . Physical activity    Days per week: Not on file    Minutes per session: Not on file  . Stress: Not on file  Relationships  . Social Herbalist on phone: Not on file    Gets together: Not on file    Attends religious service: Not on file    Active member of club or organization: Not on file    Attends meetings of clubs or  organizations: Not on file    Relationship status: Not on file  . Intimate partner violence    Fear of current or ex partner: Not on file    Emotionally abused: Not on file    Physically abused: Not on file    Forced sexual activity: Not on file  Other Topics Concern  . Not on file  Social History Narrative  . Not on file    Review of Systems: As in history of present illness.   Physical Exam: Vital signs in last 24 hours: Temp:  [98.4 F (36.9 C)-99.5 F (37.5 C)] 98.4 F (36.9 C) (09/27 0553) Pulse Rate:  [60-83] 63 (09/27 0901) Resp:  [11-30] 18 (09/27 0901) BP: (105-184)/(48-111) 141/58 (09/27 0901) SpO2:  [93 %-99 %] 99 % (09/27 0901) Weight:  [68.4 kg] 68.4 kg (09/27 0300) Last BM Date: 02/16/19 General:   Alert,  Well-developed, well-nourished, pleasant, oriented and cooperative in NAD Lungs:  Clear throughout to auscultation.   No wheezes, crackles, or rhonchi. No acute distress. Heart:  Regular rate and rhythm; no murmurs, clicks, rubs,  or gallops. Abdomen: Nondistended.  Surgical scars present.  Positive bowel sounds.  Soft and nontender without appreciable mass organomegaly.   Rectal:  Deferred; already done in ED. Intake/Output from previous day: 09/26 0701 - 09/27 0700 In: 1935.4 [P.O.:360; I.V.:1575.4] Out: -  Intake/Output this shift: No intake/output data recorded.  Lab Results: Recent Labs    02/16/19 0858 02/16/19 1513 02/16/19 2227 02/17/19 0639  WBC 7.2  --   --  7.2  HGB 11.0* 10.5* 9.4* 9.8*  HCT 35.1* 33.3* 29.8* 31.5*  PLT 242  --   --  224   BMET Recent Labs    02/16/19 0858 02/17/19 0639  NA 142 143  K 3.8 4.0  CL 107 115*  CO2 26 22  GLUCOSE 130* 100*  BUN 36* 20  CREATININE 1.73* 1.23*  CALCIUM 8.8* 8.6*   LFT Recent Labs    02/16/19 0858  PROT 7.2  ALBUMIN 3.9  AST 21  ALT 14  ALKPHOS 57  BILITOT 0.7    Impression: Very pleasant 85 year old lady admitted to the hospital with painless hematochezia.  I believe she has had a relatively modest GI bleed at this point in time given hemodynamic stability, lack of active bleeding now and only a 1 g drop in hemoglobin in the setting of IV hydration. Etiology not clear but likely emanating from her lower GI tract;   hemorrhoids, diverticular etiology or occult malignancy in the setting of anticoagulation as well as other benign lesion such as AVMs remain in  the differential. We talked about a definitive evaluation which would include a colonoscopy.  Patient states that she is too old for colonoscopy and wishes no invasive procedures.  Moreover, she states if something significant is found she would not be interested in pursuing other invasive lines of treatment including surgery.  She seems to understand the risks of not pursuing a thorough evaluation.  Recommendations: Follow hemoglobin.  Clear liquids only for today.  We will add Benefiber 1 tablespoon daily to her regimen empirically once her diet is advanced.  Anusol HC cream to the anorectum empirically With clinical information known at this time, would hold Xarelto for a total of 5 days and then resume as the benefits of this therapy appear to still outweigh the risks.  Further recommendations to follow      Notice:  This dictation was prepared with Dragon dictation along with  smaller phrase technology. Any transcriptional errors that result from this process are unintentional and may not be corrected upon review.

## 2019-02-17 NOTE — Progress Notes (Signed)
Each time patient was assisted to [facility] to void, moderate amount of bright red blood present. Amount of bleeding present in commode progressively increased throughout the shift. Patient will be monitored. No VS changes noted throughout my shift.

## 2019-02-17 NOTE — Plan of Care (Signed)
Pt with readiness and eager to learn. Progressing to meeting general care plan expected outcomes.

## 2019-02-18 ENCOUNTER — Telehealth: Payer: Self-pay | Admitting: Family Medicine

## 2019-02-18 DIAGNOSIS — K922 Gastrointestinal hemorrhage, unspecified: Secondary | ICD-10-CM

## 2019-02-18 DIAGNOSIS — I4811 Longstanding persistent atrial fibrillation: Secondary | ICD-10-CM

## 2019-02-18 LAB — BASIC METABOLIC PANEL
Anion gap: 7 (ref 5–15)
BUN: 14 mg/dL (ref 8–23)
CO2: 22 mmol/L (ref 22–32)
Calcium: 8.8 mg/dL — ABNORMAL LOW (ref 8.9–10.3)
Chloride: 114 mmol/L — ABNORMAL HIGH (ref 98–111)
Creatinine, Ser: 1.35 mg/dL — ABNORMAL HIGH (ref 0.44–1.00)
GFR calc Af Amer: 39 mL/min — ABNORMAL LOW (ref >60)
GFR calc non Af Amer: 34 mL/min — ABNORMAL LOW (ref >60)
Glucose, Bld: 109 mg/dL — ABNORMAL HIGH (ref 70–99)
Potassium: 4.1 mmol/L (ref 3.5–5.1)
Sodium: 143 mmol/L (ref 135–145)

## 2019-02-18 LAB — CBC
HCT: 30.4 % — ABNORMAL LOW (ref 36.0–46.0)
Hemoglobin: 9.5 g/dL — ABNORMAL LOW (ref 12.0–15.0)
MCH: 28.9 pg (ref 26.0–34.0)
MCHC: 31.3 g/dL (ref 30.0–36.0)
MCV: 92.4 fL (ref 80.0–100.0)
Platelets: 222 10*3/uL (ref 150–400)
RBC: 3.29 MIL/uL — ABNORMAL LOW (ref 3.87–5.11)
RDW: 12.3 % (ref 11.5–15.5)
WBC: 6.2 10*3/uL (ref 4.0–10.5)
nRBC: 0 % (ref 0.0–0.2)

## 2019-02-18 MED ORDER — CIPROFLOXACIN HCL 250 MG PO TABS
250.0000 mg | ORAL_TABLET | Freq: Every day | ORAL | Status: DC
  Administered 2019-02-18: 250 mg via ORAL
  Filled 2019-02-18: qty 1

## 2019-02-18 MED ORDER — HYDROCORTISONE ACETATE 25 MG RE SUPP: 25.0000 mg | Freq: Two times a day (BID) | RECTAL | 0 refills | Status: DC

## 2019-02-18 MED ORDER — CIPROFLOXACIN HCL 250 MG PO TABS: 250.0000 mg | ORAL_TABLET | Freq: Two times a day (BID) | ORAL | 0 refills | Status: DC

## 2019-02-18 MED ORDER — ASPIRIN EC 81 MG PO TBEC: 81.0000 mg | DELAYED_RELEASE_TABLET | Freq: Every day | ORAL | 2 refills | Status: AC

## 2019-02-18 MED ORDER — ACETAMINOPHEN 325 MG PO TABS: 650.0000 mg | ORAL_TABLET | Freq: Four times a day (QID) | ORAL | 0 refills | Status: DC | PRN

## 2019-02-18 MED ORDER — TORSEMIDE 20 MG PO TABS: ORAL_TABLET | ORAL | 6 refills | Status: DC

## 2019-02-18 NOTE — Telephone Encounter (Signed)
Patient with 1 family member (difficult to have 3 people in the same room) (if they want to tag team to some degree I am okay with that)

## 2019-02-18 NOTE — Discharge Instructions (Signed)
1)STOP Xarelto (rivaroxaban) 2)You have an appointment with Dr Sallee Lange [phone] on Monday 02/25/2019 at 11am--repeat CBC and BMP at that visit----also for recheck and reevaluation and to discuss risk versus benefit of taking Xarelto (blood thinner) 3) okay to take baby aspirin 81 mg daily with food for stroke prevention 4)Avoid ibuprofen/Advil/Aleve/Motrin/Goody Powders/Naproxen/BC powders/Meloxicam/Diclofenac/Indomethacin and other Nonsteroidal anti-inflammatory medications as these will make you more likely to bleed and can cause stomach ulcers, can also cause Kidney problems. 5) you have Klebsiella urinary tract infection----you are currently prescribed ciprofloxacin antibiotic-please call back on Tuesday, 02/19/2019 after 1 PM to get the final results of the urine culture and to see if we need to change antibiotics

## 2019-02-18 NOTE — Progress Notes (Signed)
Patient is refusing bed alarm. Pt educated several times about safety risks/concerns and patient adamant against use of alarm. Will continue to monitor.

## 2019-02-18 NOTE — Discharge Summary (Signed)
[name], is a 85 y.o. female  DOB Sep 08, 1923  MRN [id number].  Admission date:  02/16/2019  Admitting Physician  Roxan Hockey, MD  Discharge Date:  02/18/2019   Primary MD  [name], MD  Recommendations for primary care physician for things to follow:   - 1)STOP Xarelto (rivaroxaban) 2)You have an appointment with Dr Sallee Lange [phone] on Monday 02/25/2019 at 11am--repeat CBC and BMP at that visit----also for recheck and reevaluation and to discuss risk versus benefit of taking Xarelto (blood thinner) 3) okay to take baby aspirin 81 mg daily with food for stroke prevention 4)Avoid ibuprofen/Advil/Aleve/Motrin/Goody Powders/Naproxen/BC powders/Meloxicam/Diclofenac/Indomethacin and other Nonsteroidal anti-inflammatory medications as these will make you more likely to bleed and can cause stomach ulcers, can also cause Kidney problems. 5) you have Klebsiella urinary tract infection----you are currently prescribed ciprofloxacin antibiotic-please call back on Tuesday, 02/19/2019 after 1 PM to get the final results of the urine culture and to see if we need to change antibiotics  Admission Diagnosis  Acute cystitis with hematuria [N30.01] AKI (acute kidney injury) ([place]) [N17.9] Gastrointestinal hemorrhage, unspecified gastrointestinal hemorrhage type [K92.2]   Discharge Diagnosis  Acute cystitis with hematuria [N30.01] AKI (acute kidney injury) ([place]) [N17.9] Gastrointestinal hemorrhage, unspecified gastrointestinal hemorrhage type [K92.2]    Principal Problem:   GI bleed Active Problems:   Atrial fibrillation ([place])   Anxiety   GERD (gastroesophageal reflux disease)   Long term (current) use of anticoagulants   Major depression   GIB (gastrointestinal bleeding)      Past Medical History:  Diagnosis Date  . Abdominal pain, chronic, right lower quadrant    "ever since I had my  appendix removed"  . Atrial fibrillation ([place])   . Breast cancer ([place])   . Cancer ([place])    left side  . Chronic back pain   . Edema   . GERD (gastroesophageal reflux disease)   . Hyperlipidemia   . Hypertension   . Osteopenia   . Osteoporosis   . Ovarian cyst 02/2015   left  . Recurrent abdominal pain    LLQ    Past Surgical History:  Procedure Laterality Date  . APPENDECTOMY    . APPENDECTOMY    . BREAST SURGERY Left    lumpectomy  . CARDIOVERSION N/A 03/31/2016   Procedure: CARDIOVERSION;  Surgeon: Josue Hector, MD;  Location: [facility];  Service: Cardiovascular;  Laterality: N/A;  . COLON RESECTION    . COLON SURGERY    . HERNIA REPAIR    . HERNIA REPAIR       HPI  from the history and physical done on the day of admission:    -[name]  is a 85 y.o. female with past medical history relevant for chronic atrial fibrillation on Xarelto for stroke prophylaxis, anxiety and depressive disorder, GERD, H/o subarachnoid hemorrhage/ICH, hypertension who presents to the ED with concerns about BRBPR x multiple episodes (> 3) since 0630 am, first BM had very little stool and some abd pain........ subsequent episodes of  BRBPR were w/o stool and painless  -Complains of fatigue, no chest pains no palpitations no significant dizziness no dyspnea on exertion  No nausea or vomiting,   No fever  Or chills   In ED--stool occult blood is positive, patient with bright red blood per day -Creatinine is up to 1.73 from a baseline around 1.2 BUN is up to 36 -UA suggestive of UTI -Hemoglobin is down to 10.5 with a baseline usually between 11 and 12  -EDP apparently discussed case with on-call GI physician Dr. Gala Romney -Rectal exam in the ED suggest hemorrhoids please see photos in epic  --Last colonoscopy more than 10 years ago, history of diverticulosis however     Hospital Course:    - Brief summary -85 y.o.femalewith past medical history relevant for chronic atrial  fibrillation on Xarelto for stroke prophylaxis,anxiety and depressive disorder, GERD, H/osubarachnoid hemorrhage/ICH, hypertension admitted on 02/16/2019 with BRBPR  A/p  1) Presumed lowerGI bleed/BRBPR----??? Hemorrhoids Vs Diverticular Bleed- GI physician Dr. Gala Romney consult appreciated, patient declines colonoscopy at this time --Rectal exam in the ED suggest hemorrhoids please see photos in epic -Last colonoscopy more than 10 years ago, patient has history of diverticulosis  -Anusol HC for presumed hemorrhoids-ordered by GI physician -GI consult appreciated at this time patient not inclined to have a colonoscopy -No further rectal bleeding -Outpatient follow-up with PCP to discuss risk versus benefit of ongoing anticoagulation arranged -Personally spoke with PCP prior to discharge -Tolerating diet okay at this time  2) acute blood loss symptomatic anemia--- secondary to #1 above, baseline hemoglobin usually close to 12, admission hemoglobin was 11.0, hemoglobin currently around 9.5. --Repeat CBC with PCP at follow-up appointment next week  3)PAF-currently in sinus rhythm, hold Xarelto due to #1 above, continue Cardizem CD 120 mg daily and metoprolol XL 50 mg daily for rate control -We will hold Xarelto for at least 5 days, patient and family will talk to PCP about risk versus benefit of restarting anticoagulation  4) Klebsiella UTI-okay to complete Cipro as per sensitivity report.  patient is allergic to sulfa penicillin and cephalosporins -Patient had dysuria improved with Pyridium and Cipro  5)depression andanxiety--stable, continue Zoloft 50 mg daily, may use trazodone as needed for sleep,,  6)Hypothyroidism--continue levothyroxine 75 mcg daily  7)AKI----acute kidney injury on CKD stage -II,worsening renal function is due to GI bleed with prerenal azotemia and decreased renal perfusion,creatinine on admission=1.73, baseline creatinine = 1.2, creatinine with  hydration is down to 1.35,renally adjust medications, avoid nephrotoxic agents / dehydration / hypotension --Repeat BMP with PCP at next visit  8) chronic anticoagulation--patient with history of subarachnoid bleed/ICH-please see CT head report from 07/16/2018 and repeat CT head from 07/18/2018 -- Patient with paroxysmal atrial fibrillation currently in sinus rhythm, -Risk versus benefit of full anticoagulation with Xarelto given GI bleed and recent ICH discussed with patient, her son and patient's PCP Dr. Sallee Lange----.  We will hold Xarelto at this time -Patient will use aspirin 81 mg for stroke prophylaxis -Patient will discuss with Dr. Sallee Lange the PCP on 02/25/2019--to see if she wants to go back on Xarelto or just stay on baby aspirin -I called and discussed this case with patient's PCP Dr. Sallee Lange prior to discharge   Code Status : Full  Family Communication:   Discussed with her son - (patient is alert, awake and coherent)  Disposition Plan  :  Home  Consults  :  Gi  Discharge Condition: stable  Diet and Activity recommendation:  As advised  Discharge Instructions    Discharge Instructions    Call MD for:  difficulty breathing, headache or visual disturbances   Complete by: As directed    Call MD for:  persistant dizziness or light-headedness   Complete by: As directed    Call MD for:  persistant nausea and vomiting   Complete by: As directed    Call MD for:  severe uncontrolled pain   Complete by: As directed    Call MD for:  temperature >100.4   Complete by: As directed    Diet - low sodium heart healthy   Complete by: As directed    Discharge instructions   Complete by: As directed    1)STOP Xarelto (rivaroxaban) 2)You have an appointment with Dr Sallee Lange [phone] on Monday 02/25/2019 at 11am--repeat CBC and BMP at that visit----also for recheck and reevaluation and to discuss risk versus benefit of taking Xarelto (blood thinner) 3) okay  to take baby aspirin 81 mg daily with food for stroke prevention 4)Avoid ibuprofen/Advil/Aleve/Motrin/Goody Powders/Naproxen/BC powders/Meloxicam/Diclofenac/Indomethacin and other Nonsteroidal anti-inflammatory medications as these will make you more likely to bleed and can cause stomach ulcers, can also cause Kidney problems. 5) you have Klebsiella urinary tract infection----you are currently prescribed ciprofloxacin antibiotic-please call back on Tuesday, 02/19/2019 after 1 PM to get the final results of the urine culture and to see if we need to change antibiotics   Increase activity slowly   Complete by: As directed        Discharge Medications     Allergies as of 02/18/2019      Reactions   Amiodarone Itching   Amoxicillin Hives   Cephalosporins Itching   Lipitor [atorvastatin] Other (See Comments)   Unknown:patient is not aware of this allergy   Lorazepam Other (See Comments)   Exceptional fatigue   Lovenox [enoxaparin Sodium] Nausea And Vomiting   Morphine And Related Itching   Penicillins Swelling   Has patient had a PCN reaction causing immediate rash, facial/tongue/throat swelling, SOB or lightheadedness with hypotension: unknown Has patient had a PCN reaction causing severe rash involving mucus membranes or skin necrosis: unknown Has patient had a PCN reaction that required hospitalization No Has patient had a PCN reaction occurring within the last 10 years: no If all of the above answers are "NO", then may proceed with Cephalosporin use.   Sulfa Antibiotics Swelling      Medication List    STOP taking these medications   Xarelto 20 MG Tabs tablet Generic drug: rivaroxaban     TAKE these medications   acetaminophen 325 MG tablet Commonly known as: TYLENOL Take 2 tablets (650 mg total) by mouth every 6 (six) hours as needed for mild pain (or Fever >/= 101). What changed:   when to take this  reasons to take this   ALPRAZolam 0.25 MG tablet Commonly known as:  XANAX TAKE 1 TABLET BY MOUTH TWICE DAILY AS NEEDED FOR ANXIETY OR SLEEP. What changed: See the new instructions.   aspirin EC 81 MG tablet Take 1 tablet (81 mg total) by mouth daily with breakfast.   calcium carbonate 500 MG chewable tablet Commonly known as: TUMS - dosed in mg elemental calcium Chew 1 tablet by mouth daily.   ciprofloxacin 250 MG tablet Commonly known as: CIPRO Take 1 tablet (250 mg total) by mouth 2 (two) times daily for 3 days.   cyanocobalamin 1000 MCG tablet Take 1 tablet (1,000 mcg total) by mouth daily.   diltiazem 120 MG 24 hr  capsule Commonly known as: CARDIZEM CD TAKE (1) CAPSULE BY MOUTH ONCE DAILY. What changed: See the new instructions.   HYDROcodone-acetaminophen 5-325 MG tablet Commonly known as: NORCO/VICODIN 1/2 tablet every 12 hours as needed use sparingly What changed:   how much to take  how to take this  when to take this  reasons to take this   hydrocortisone 25 MG suppository Commonly known as: ANUSOL-HC Place 1 suppository (25 mg total) rectally 2 (two) times daily. For hemorrhoids   levothyroxine 75 MCG tablet Commonly known as: SYNTHROID Take 1 tablet (75 mcg total) by mouth daily.   lisinopril 2.5 MG tablet Commonly known as: Zestril Take 1 tablet (2.5 mg total) by mouth daily.   metoprolol succinate 50 MG 24 hr tablet Commonly known as: TOPROL-XL Take 1 tablet (50 mg total) by mouth daily. Take with or immediately following a meal.   multivitamin tablet Take 1 tablet by mouth daily.   ondansetron 4 MG disintegrating tablet Commonly known as: ZOFRAN-ODT Take 1 tablet (4 mg total) by mouth every 8 (eight) hours as needed for nausea or vomiting.   pantoprazole 40 MG tablet Commonly known as: PROTONIX Take 1 tablet (40 mg total) by mouth daily.   potassium chloride 10 MEQ tablet Commonly known as: K-DUR Take 1 tablet (10 mEq total) by mouth daily.   sertraline 50 MG tablet Commonly known as: ZOLOFT Take 1  tablet (50 mg total) by mouth daily.   torsemide 20 MG tablet Commonly known as: DEMADEX TAKE 1 TABLET DAILY EVERY MORNING. What changed: additional instructions      Major procedures and Radiology Reports - PLEASE review detailed and final reports for all details, in brief -   No results found.  Micro Results    Recent Results (from the past 240 hour(s))  Urine Culture     Status: Abnormal (Preliminary result)   Collection Time: 02/16/19 10:09 AM   Specimen: Urine, Clean Catch  Result Value Ref Range Status   Specimen Description   Final    URINE, CLEAN CATCH Performed at [facility], [address], [place], [place] [address]    Special Requests   Final    NONE Performed at [facility], [address], [place], [place] [address]    Culture 60,000 COLONIES/mL KLEBSIELLA OXYTOCA (A)  Final   Report Status PENDING  Incomplete  SARS Coronavirus 2 [facility] order, Performed in [facility] hospital lab) Nasopharyngeal Nasopharyngeal Swab     Status: None   Collection Time: 02/16/19 12:33 PM   Specimen: Nasopharyngeal Swab  Result Value Ref Range Status   SARS Coronavirus 2 NEGATIVE NEGATIVE Final    Comment: (NOTE) If result is NEGATIVE SARS-CoV-2 target nucleic acids are NOT DETECTED. The SARS-CoV-2 RNA is generally detectable in upper and lower  respiratory specimens during the acute phase of infection. The lowest  concentration of SARS-CoV-2 viral copies this assay can detect is 250  copies / mL. A negative result does not preclude SARS-CoV-2 infection  and should not be used as the sole basis for treatment or other  patient management decisions.  A negative result may occur with  improper specimen collection / handling, submission of specimen other  than nasopharyngeal swab, presence of viral mutation(s) within the  areas targeted by this assay, and inadequate number of viral copies  (<250 copies / mL). A negative result must be combined with clinical  observations,  patient history, and epidemiological information. If result is POSITIVE SARS-CoV-2 target nucleic acids are  DETECTED. The SARS-CoV-2 RNA is generally detectable in upper and lower  respiratory specimens dur ing the acute phase of infection.  Positive  results are indicative of active infection with SARS-CoV-2.  Clinical  correlation with patient history and other diagnostic information is  necessary to determine patient infection status.  Positive results do  not rule out bacterial infection or co-infection with other viruses. If result is PRESUMPTIVE POSTIVE SARS-CoV-2 nucleic acids MAY BE PRESENT.   A presumptive positive result was obtained on the submitted specimen  and confirmed on repeat testing.  While 2019 novel coronavirus  (SARS-CoV-2) nucleic acids may be present in the submitted sample  additional confirmatory testing may be necessary for epidemiological  and / or clinical management purposes  to differentiate between  SARS-CoV-2 and other Sarbecovirus currently known to infect humans.  If clinically indicated additional testing with an alternate test  methodology [phone]) is advised. The SARS-CoV-2 RNA is generally  detectable in upper and lower respiratory sp ecimens during the acute  phase of infection. The expected result is Negative. Fact Sheet for Patients:  StrictlyIdeas.no Fact Sheet for Healthcare Providers: BankingDealers.co.za This test is not yet approved or cleared by the [place] FDA and has been authorized for detection and/or diagnosis of SARS-CoV-2 by FDA under an Emergency Use Authorization (EUA).  This EUA will remain in effect (meaning this test can be used) for the duration of the COVID-19 declaration under Section 564(b)(1) of the Act, 21 U.S.C. section 360bbb-3(b)(1), unless the authorization is terminated or revoked sooner. Performed at [facility], [address] [place], [place] [address]         Today   Subjective    Eben Burow today has no new complaints, No dizziness, no shortness of breath, tolerating oral intake, eager to go home          Patient has been seen and examined prior to discharge   Objective   Blood pressure (!) 146/58, pulse 64, temperature 98.1 F (36.7 C), temperature source Oral, resp. rate 18, height 5\' 4"  (1.626 m), weight 68.4 kg, SpO2 97 %.   Intake/Output Summary (Last 24 hours) at 02/18/2019 1405 Last data filed at 02/18/2019 0900 Gross per 24 hour  Intake 870.38 ml  Output 450 ml  Net 420.38 ml    Exam Gen:- Awake Alert, in no apparent distress  HEENT:- [place].AT, No sclera icterus Neck-Supple Neck,No JVD,.  Lungs-  CTAB , fair symmetrical air movement CV- S1, S2 normal, regular  Abd-  +ve B.Sounds, Abd Soft, No significant tenderness,    Extremity/Skin:- No  edema, pedal pulses present  Psych-affect is appropriate, oriented x3 Neuro-no new focal deficits, no tremors Rectal Exam--hemorrhoids, please see photos in epic   Data Review   CBC w Diff:  Lab Results  Component Value Date   WBC 6.2 02/18/2019   HGB 9.5 (L) 02/18/2019   HGB 11.0 (L) 05/29/2018   HCT 30.4 (L) 02/18/2019   HCT 32.8 (L) 05/29/2018   PLT 222 02/18/2019   PLT 346 05/29/2018   LYMPHOPCT 31 02/16/2019   MONOPCT 8 02/16/2019   EOSPCT 3 02/16/2019   BASOPCT 0 02/16/2019    CMP:  Lab Results  Component Value Date   NA 143 02/18/2019   NA 142 05/29/2018   K 4.1 02/18/2019   CL 114 (H) 02/18/2019   CO2 22 02/18/2019   BUN 14 02/18/2019   BUN 32 05/29/2018   CREATININE 1.35 (H) 02/18/2019   CREATININE 0.64 09/10/2013  PROT 7.2 02/16/2019   PROT 6.8 03/20/2018   ALBUMIN 3.9 02/16/2019   ALBUMIN 4.1 03/20/2018   BILITOT 0.7 02/16/2019   BILITOT 0.2 03/20/2018   ALKPHOS 57 02/16/2019   AST 21 02/16/2019   ALT 14 02/16/2019  .   Total Discharge time is about 33 minutes  Roxan Hockey M.D on 02/18/2019 at 2:05 PM  Go to [email or web address] -   for contact info  [facility] - Office  [phone]

## 2019-02-18 NOTE — Progress Notes (Addendum)
    Subjective: No abdominal pain. No rectal bleeding. Hungry and wants to advance diet. Still wanting to hold off on colonoscopy.   Objective: Vital signs in last 24 hours: Temp:  [98.1 F (36.7 C)-98.7 F (37.1 C)] 98.1 F (36.7 C) (09/28 0636) Pulse Rate:  [55-64] 64 (09/28 0952) Resp:  [18] 18 (09/28 0636) BP: (140-146)/(56-59) 146/58 (09/28 0636) SpO2:  [95 %-97 %] 97 % (09/28 0811) Last BM Date: 02/17/19 General:   Alert and oriented, pleasant Head:  Normocephalic and atraumatic.  Abdomen:  Bowel sounds present, soft, non-tender, non-distended. Sitting up in chair. Extremities:  Without  edema. Neurologic:  Alert and  oriented x4  Intake/Output from previous day: 09/27 0701 - 09/28 0700 In: 630.4 [I.V.:630.4] Out: -  Intake/Output this shift: Total I/O In: 240 [P.O.:240] Out: 450 [Urine:450]  Lab Results: Recent Labs    02/16/19 0858  02/17/19 0639 02/17/19 1449 02/18/19 0935  WBC 7.2  --  7.2  --  6.2  HGB 11.0*   < > 9.8* 9.6* 9.5*  HCT 35.1*   < > 31.5* 30.3* 30.4*  PLT 242  --  224  --  222   < > = values in this interval not displayed.   BMET Recent Labs    02/16/19 0858 02/17/19 0639 02/18/19 0512  NA 142 143 143  K 3.8 4.0 4.1  CL 107 115* 114*  CO2 26 22 22   GLUCOSE 130* 100* 109*  BUN 36* 20 14  CREATININE 1.73* 1.23* 1.35*  CALCIUM 8.8* 8.6* 8.8*   LFT Recent Labs    02/16/19 0858  PROT 7.2  ALBUMIN 3.9  AST 21  ALT 14  ALKPHOS 23  BILITOT 0.7    Assessment: 85 year old female admitted with painless rectal bleeding in setting of anticoagulation, with differentials including hemorrhoids, diverticular, occult malignancy, AVMS. She desires conservative approach and declining invasive procedures. No further rectal bleeding. Desires to advance her diet. Will increase to soft diet.     Plan: Hold Xarelto for total of 5 days Anusol BID Recommend Benefiber 1 tbsp daily going forward (will need to see if this is on formulary).   Increase to soft diet Hopeful discharge in near future   Annitta Needs, PhD, ANP-BC [facility]     LOS: 1 day    02/18/2019, 11:44 AM

## 2019-02-18 NOTE — Plan of Care (Signed)

## 2019-02-18 NOTE — Telephone Encounter (Signed)
Patient in the hospital  Hospitalist called to set up follow-up appointment 11 AM on Monday here at the office Family is to call us if that appointment does not fit with their schedule

## 2019-02-18 NOTE — Telephone Encounter (Signed)
Patient's granddaughter called in and is okay with the 11:00 appt on Monday and she said that either she or her father would be bringing her to the appt, or both.  How many people can we allow to come to this appointment?  She said it was important that they all have a discussion and come together on a plan.  I am just trying to anticipate a plan for prescreening when it comes time.

## 2019-02-19 LAB — URINE CULTURE: Culture: 60000 — AB

## 2019-02-25 ENCOUNTER — Other Ambulatory Visit: Payer: Self-pay

## 2019-02-25 ENCOUNTER — Ambulatory Visit (INDEPENDENT_AMBULATORY_CARE_PROVIDER_SITE_OTHER): Payer: Medicare Other | Admitting: Family Medicine

## 2019-02-25 ENCOUNTER — Encounter: Payer: Self-pay | Admitting: Family Medicine

## 2019-02-25 VITALS — BP 118/62 | Temp 98.4°F | Wt 140.0 lb

## 2019-02-25 DIAGNOSIS — I4891 Unspecified atrial fibrillation: Secondary | ICD-10-CM | POA: Diagnosis not present

## 2019-02-25 DIAGNOSIS — D649 Anemia, unspecified: Secondary | ICD-10-CM

## 2019-02-25 DIAGNOSIS — Z79899 Other long term (current) drug therapy: Secondary | ICD-10-CM

## 2019-02-25 DIAGNOSIS — N390 Urinary tract infection, site not specified: Secondary | ICD-10-CM

## 2019-02-25 LAB — POCT URINALYSIS DIPSTICK
Spec Grav, UA: <=1.005 — AB (ref 1.010–1.025)
pH, UA: 5 (ref 5.0–8.0)

## 2019-02-25 MED ORDER — NITROFURANTOIN MONOHYD MACRO 100 MG PO CAPS: 100.0000 mg | ORAL_CAPSULE | Freq: Two times a day (BID) | ORAL | 0 refills | Status: DC

## 2019-02-25 NOTE — Progress Notes (Signed)
Subjective:    Patient ID: [name], female    DOB: May 06, 1924, 85 y.o.   MRN: [id number]  HPIpt arrives with [name].  Would like to discuss xarelto.  Patient comes in today for follow-up from hospitalization.  She had a lower GI bleed.  It was not severe.  It did go away within 1 day.  She was in the hospital for 2 days.  Did not require transfusion.  It was felt more than likely due to a diverticula or possible internal hemorrhoid.  Patient denies any constipation issues.  She states she is doing fine now with no blood in her bowel movements.  We will be doing follow-up blood work.  Patient has a history of atrial fibrillation and is on medication to help protect her against strokes but this patient had a intracerebral bleed-subarachnoid hemorrhage after sustaining a fall in February 2019.  And now has had this episode.  When she was in the hospital the hospitalist state that her rhythm was normal sinus.  This raises into question does the patient really need Xarelto because of the risk of bleeding given that she is 85 years old-at the same time we certainly do not want her to have a stroke  Finished cipro for UTI. Pt states she is still having urinary symptoms but unable to get a urine sample today. Urine culture was done in hospital.  Patient finally was able to give a urine specimen and shows WBCs she does relate some dysuria we will do 1 round of Macrobid  This patient is becoming more debilitated with age she is able to walk around the house with her walker but otherwise is in a wheelchair.  She denies any falls recently.    Review of Systems  Constitutional: Negative for activity change, appetite change and fatigue.  HENT: Negative for congestion and rhinorrhea.   Respiratory: Negative for cough and shortness of breath.   Cardiovascular: Negative for chest pain and leg swelling.  Gastrointestinal: Negative for abdominal pain, blood in stool, constipation and  diarrhea.  Endocrine: Negative for polydipsia and polyphagia.  Skin: Negative for color change.  Neurological: Negative for dizziness and weakness.  Psychiatric/Behavioral: Negative for behavioral problems and confusion.       Objective:   Physical Exam Vitals signs reviewed.  Constitutional:      General: She is not in acute distress. HENT:     Head: Normocephalic and atraumatic.  Eyes:     General:        Right eye: No discharge.        Left eye: No discharge.  Neck:     Trachea: No tracheal deviation.  Cardiovascular:     Rate and Rhythm: Normal rate and regular rhythm.     Heart sounds: Normal heart sounds. No murmur.  Pulmonary:     Effort: Pulmonary effort is normal. No respiratory distress.     Breath sounds: Normal breath sounds.  Lymphadenopathy:     Cervical: No cervical adenopathy.  Skin:    General: Skin is warm and dry.  Neurological:     Mental Status: She is alert.     Coordination: Coordination normal.  Psychiatric:        Behavior: Behavior normal.           Assessment & Plan:  Atrial fibrillation- patient's heart rhythm seems normal sinus rhythm currently we will go ahead and set her up with cardiology.  If the patient is in normal sinus  rhythm on a regular basis then Xarelto is not necessary.  See discussion below  As for the torsemide she would just use 1 each morning  As for now we will hold Xarelto.  Await cardiology's input.  Recent GI bleeding no evidence of bleeding now at her age we will not be doing a colonoscopy.  This is a second significant bleeding within the past year and a half while on medication.  This raises into question whether or not the medication is really necessary.  Potentially patient needs to stop the Xarelto for now until cardiology sees her to help judge if ongoing use of Xarelto is wise I believe patient is interested in trying the Xarelto ongoing if it was felt necessary  Cognitive dysfunction I believe that this  is normal for her age her [name] cognitive assessment is 82 out of 30 but I would not recommend Namenda or Aricept at this point we will reassess again in a month  Urinary tract infection go ahead with Macrobid twice daily for 7 days

## 2019-02-26 ENCOUNTER — Encounter: Payer: Self-pay | Admitting: Family Medicine

## 2019-02-26 LAB — BASIC METABOLIC PANEL
BUN/Creatinine Ratio: 17 (ref 12–28)
BUN: 29 mg/dL (ref 10–36)
CO2: 23 mmol/L (ref 20–29)
Calcium: 9.3 mg/dL (ref 8.7–10.3)
Chloride: 103 mmol/L (ref 96–106)
Creatinine, Ser: 1.69 mg/dL — ABNORMAL HIGH (ref 0.57–1.00)
GFR calc Af Amer: 30 mL/min/{1.73_m2} — ABNORMAL LOW (ref >59)
GFR calc non Af Amer: 26 mL/min/{1.73_m2} — ABNORMAL LOW (ref >59)
Glucose: 92 mg/dL (ref 65–99)
Potassium: 4.3 mmol/L (ref 3.5–5.2)
Sodium: 143 mmol/L (ref 134–144)

## 2019-02-26 LAB — CBC WITH DIFFERENTIAL/PLATELET
Basophils Absolute: 0 10*3/uL (ref 0.0–0.2)
Basos: 0 %
EOS (ABSOLUTE): 0.3 10*3/uL (ref 0.0–0.4)
Eos: 3 %
Hematocrit: 32.2 % — ABNORMAL LOW (ref 34.0–46.6)
Hemoglobin: 10.7 g/dL — ABNORMAL LOW (ref 11.1–15.9)
Immature Grans (Abs): 0 10*3/uL (ref 0.0–0.1)
Immature Granulocytes: 1 %
Lymphocytes Absolute: 2.3 10*3/uL (ref 0.7–3.1)
Lymphs: 28 %
MCH: 28.4 pg (ref 26.6–33.0)
MCHC: 33.2 g/dL (ref 31.5–35.7)
MCV: 85 fL (ref 79–97)
Monocytes Absolute: 0.6 10*3/uL (ref 0.1–0.9)
Monocytes: 8 %
Neutrophils Absolute: 5.2 10*3/uL (ref 1.4–7.0)
Neutrophils: 60 %
Platelets: 291 10*3/uL (ref 150–450)
RBC: 3.77 x10E6/uL (ref 3.77–5.28)
RDW: 12.8 % (ref 11.7–15.4)
WBC: 8.5 10*3/uL (ref 3.4–10.8)

## 2019-02-26 LAB — HEPATIC FUNCTION PANEL
ALT: 15 IU/L (ref 0–32)
AST: 20 IU/L (ref 0–40)
Albumin: 4.4 g/dL (ref 3.5–4.6)
Alkaline Phosphatase: 67 IU/L (ref 39–117)
Bilirubin Total: 0.2 mg/dL (ref 0.0–1.2)
Bilirubin, Direct: 0.1 mg/dL (ref 0.00–0.40)
Total Protein: 7.2 g/dL (ref 6.0–8.5)

## 2019-02-27 LAB — URINE CULTURE

## 2019-03-11 ENCOUNTER — Other Ambulatory Visit: Payer: Self-pay | Admitting: *Deleted

## 2019-03-11 MED ORDER — TORSEMIDE 20 MG PO TABS: ORAL_TABLET | ORAL | 5 refills | Status: DC

## 2019-03-25 ENCOUNTER — Ambulatory Visit (INDEPENDENT_AMBULATORY_CARE_PROVIDER_SITE_OTHER): Payer: Medicare Other | Admitting: Family Medicine

## 2019-03-25 VITALS — BP 137/72

## 2019-03-25 DIAGNOSIS — E038 Other specified hypothyroidism: Secondary | ICD-10-CM | POA: Diagnosis not present

## 2019-03-25 DIAGNOSIS — N390 Urinary tract infection, site not specified: Secondary | ICD-10-CM

## 2019-03-25 MED ORDER — DOXYCYCLINE HYCLATE 100 MG PO TABS: 100.0000 mg | ORAL_TABLET | Freq: Two times a day (BID) | ORAL | 0 refills | Status: DC

## 2019-03-25 NOTE — Progress Notes (Signed)
Subjective:    Patient ID: [name], female    DOB: [birth year], 85 y.o.   MRN: [id number]  HPI  Patient calls for a follow up from last visit. Granddaughter(DPR) stated she was just with the patient and she was not feeling great today- didnt sleep well and feels like in fog but no pain currently. Patient relates some UTI symptoms slight burning denies high fever chills recently treated with antibiotics Has recurrent UTI Patient also has history of atrial fibrillation but on last visit to the office it did not seem to be in atrial fibrillation She is also had some bleeding issues Therefore we have stopped her Xarelto She is doing a baby aspirin daily  BP 137/72  Virtual Visit via Video Note  I connected with [name] on 03/25/19 at  1:40 PM EST by a video enabled telemedicine application and verified that I am speaking with the correct person using two identifiers.  Location: Patient: home Provider: office   I discussed the limitations of evaluation and management by telemedicine and the availability of in person appointments. The patient expressed understanding and agreed to proceed.  History of Present Illness:    Observations/Objective:   Assessment and Plan:   Follow Up Instructions:    I discussed the assessment and treatment plan with the patient. The patient was provided an opportunity to ask questions and all were answered. The patient agreed with the plan and demonstrated an understanding of the instructions.   The patient was advised to call back or seek an in-person evaluation if the symptoms worsen or if the condition fails to improve as anticipated.  I provided 17 minutes of non-face-to-face time during this encounter.     Review of Systems  Constitutional: Negative for activity change and appetite change.  HENT: Negative for congestion and rhinorrhea.   Respiratory: Negative for cough and shortness of breath.   Cardiovascular: Negative  for chest pain and leg swelling.  Gastrointestinal: Negative for abdominal pain, nausea and vomiting.  Musculoskeletal: Positive for arthralgias, back pain and gait problem.  Skin: Negative for color change.  Neurological: Negative for dizziness and weakness.  Psychiatric/Behavioral: Negative for agitation and confusion.       Objective:   Physical Exam  Patient had virtual visit Appears to be in no distress Atraumatic Neuro able to relate and oriented No apparent resp distress Color normal  Patient does have significant ataxia uses a walker to walk high risk of falling Fall Risk  03/25/2019 02/25/2019 07/26/2017 02/23/2017 04/19/2016  Falls in the past year? 0 0 No Yes No  Number falls in past yr: - - - 1 -  Injury with Fall? - - - No -  Risk for fall due to : Impaired balance/gait;Impaired mobility History of fall(s);Impaired mobility - Impaired mobility -  Follow up Falls evaluation completed Falls evaluation completed - Education provided -       Assessment & Plan:  Patient with recurrent UTI Ideally we would not want to treat with antibiotics but in this particular case having some symptomatology therefore doxycycline twice daily for 7 days Very important to keep well-hydrated to minimize reoccurrence  Hypothyroidism takes medication on a daily basis states energy level doing okay  Other chronic health measures are doing okay no bleeding issues  As per previous discussion-please see recent hospitalization discharge summary-also see office visit note from 02/25/2019 Currently right now I would not advise for the patient to be on Xarelto.  Risk outweighs benefit  currently

## 2019-03-26 ENCOUNTER — Other Ambulatory Visit: Payer: Self-pay | Admitting: Family Medicine

## 2019-04-03 ENCOUNTER — Other Ambulatory Visit: Payer: Self-pay | Admitting: Cardiology

## 2019-04-03 ENCOUNTER — Other Ambulatory Visit: Payer: Self-pay | Admitting: Family Medicine

## 2019-05-03 ENCOUNTER — Encounter: Payer: Self-pay | Admitting: Cardiology

## 2019-05-03 ENCOUNTER — Telehealth (INDEPENDENT_AMBULATORY_CARE_PROVIDER_SITE_OTHER): Payer: Medicare Other | Admitting: Cardiology

## 2019-05-03 VITALS — BP 156/80 | Ht 66.0 in | Wt 140.0 lb

## 2019-05-03 DIAGNOSIS — I480 Paroxysmal atrial fibrillation: Secondary | ICD-10-CM

## 2019-05-03 DIAGNOSIS — I10 Essential (primary) hypertension: Secondary | ICD-10-CM

## 2019-05-03 NOTE — Patient Instructions (Signed)

## 2019-05-03 NOTE — Progress Notes (Signed)
Virtual Visit via Telephone Note   This visit type was conducted due to national recommendations for restrictions regarding the COVID-19 Pandemic (e.g. social distancing) in an effort to limit this patient's exposure and mitigate transmission in our community.  Due to her co-morbid illnesses, this patient is at least at moderate risk for complications without adequate follow up.  This format is felt to be most appropriate for this patient at this time.  The patient did not have access to video technology/had technical difficulties with video requiring transitioning to audio format only (telephone).  All issues noted in this document were discussed and addressed.  No physical exam could be performed with this format.  Please refer to the patient's chart for her  consent to telehealth for [facility].   Date:  05/03/2019   ID:  [name], DOB Jul 06, 1923, MRN [id number]  Patient Location: Home Provider Location: Office  PCP:  [name], MD  Cardiologist:  Dr Carlyle Dolly MD Electrophysiologist:  None   Evaluation Performed:  Follow-Up Visit  Chief Complaint:  Follow up visit  History of Present Illness:    [name] is a 85 y.o. female seen today for follow up of the following medical problems.   1. Afib/Aflutter - metoprolol makes her tired, higher dose made her more fatigued and did not improve her palpitatins.  - changed to dilttiazem due to ongoing palpitations with increased beta blocker dose and fatigue.  - due to side effects and ineffectiveness of AV nodal agents started amiodarone.  - s/p DCCV 03/2016  - pcp recently lowered dilt from 180mg  to 120mg  due to orthostatic symptoms. Patient has mistakingly been taking both a 120mg  and a 180mg  table daily for a total of 300mg  daily. On this dose significant increase in LE edema - patient also with new diagnosis of hypothryoidism, potentially amio related. Started on synthroid by pcp - no recent  palpitaitons.severe dizziness, difficulty walking at home.   - no recent palpitations.  - off xarelto since recent admission with GI bleed. Admit last year with small SAH.     2. LE edema - no recent issues   3. GI bleed - admit 01/2019 with GI bleed, bright red blood per rectum - Hgb 10.5, down from baseline 11 to 12 - she decliend colonscopy. Xarelto held  - no recurrent bleeding.   4. History of ICH/SAH 06/2017 - suggested by CT but not seen on MRI     SH: her sister is also a patient of mine, [employer]  The patient does not have symptoms concerning for COVID-19 infection (fever, chills, cough, or new shortness of breath).    Past Medical History:  Diagnosis Date  . Abdominal pain, chronic, right lower quadrant    "ever since I had my appendix removed"  . Atrial fibrillation ([place])   . Breast cancer ([place])   . Cancer ([place])    left side  . Chronic back pain   . Edema   . GERD (gastroesophageal reflux disease)   . Hyperlipidemia   . Hypertension   . Osteopenia   . Osteoporosis   . Ovarian cyst 02/2015   left  . Recurrent abdominal pain    LLQ   Past Surgical History:  Procedure Laterality Date  . APPENDECTOMY    . APPENDECTOMY    . BREAST SURGERY Left    lumpectomy  . CARDIOVERSION N/A 03/31/2016   Procedure: CARDIOVERSION;  Surgeon: Josue Hector, MD;  Location: [facility];  Service: Cardiovascular;  Laterality: N/A;  . COLON RESECTION    . COLON SURGERY    . HERNIA REPAIR    . HERNIA REPAIR       Current Meds  Medication Sig  . acetaminophen (TYLENOL) 325 MG tablet Take 2 tablets (650 mg total) by mouth every 6 (six) hours as needed for mild pain (or Fever >/= 101).  Marland Kitchen ALPRAZolam (XANAX) 0.25 MG tablet TAKE 1 TABLET BY MOUTH TWICE DAILY AS NEEDED FOR ANXIETY OR SLEEP.  Marland Kitchen aspirin EC 81 MG tablet Take 1 tablet (81 mg total) by mouth daily with breakfast.  . calcium carbonate (TUMS - DOSED IN MG ELEMENTAL CALCIUM) 500 MG chewable tablet  Chew 1 tablet by mouth daily.   Marland Kitchen diltiazem (CARDIZEM CD) 120 MG 24 hr capsule TAKE (1) CAPSULE BY MOUTH ONCE DAILY.  Marland Kitchen doxycycline (VIBRA-TABS) 100 MG tablet Take 1 tablet (100 mg total) by mouth 2 (two) times daily.  Marland Kitchen HYDROcodone-acetaminophen (NORCO/VICODIN) 5-325 MG tablet 1/2 tablet every 12 hours as needed use sparingly (Patient taking differently: Take 0.5 tablets by mouth every 12 (twelve) hours as needed for moderate pain. 1/2 tablet every 12 hours as needed use sparingly)  . hydrocortisone (ANUSOL-HC) 25 MG suppository Place 1 suppository (25 mg total) rectally 2 (two) times daily. For hemorrhoids  . levothyroxine (SYNTHROID) 75 MCG tablet Take 1 tablet (75 mcg total) by mouth daily.  Marland Kitchen lisinopril (ZESTRIL) 2.5 MG tablet Take 1 tablet (2.5 mg total) by mouth daily.  . metoprolol succinate (TOPROL-XL) 50 MG 24 hr tablet TAKE 1 TABLET BY MOUTH DAILY. TAKE WITH OR IMMEDIATELY FOLLOWING A MEAL.  . Multiple Vitamin (MULTIVITAMIN) tablet Take 1 tablet by mouth daily.  . nitrofurantoin, macrocrystal-monohydrate, (MACROBID) 100 MG capsule Take 1 capsule (100 mg total) by mouth 2 (two) times daily.  . ondansetron (ZOFRAN-ODT) 4 MG disintegrating tablet Take 1 tablet (4 mg total) by mouth every 8 (eight) hours as needed for nausea or vomiting.  . pantoprazole (PROTONIX) 40 MG tablet Take 1 tablet (40 mg total) by mouth daily.  . potassium chloride (K-DUR) 10 MEQ tablet Take 1 tablet (10 mEq total) by mouth daily.  . sertraline (ZOLOFT) 50 MG tablet Take 1 tablet (50 mg total) by mouth daily.  Marland Kitchen torsemide (DEMADEX) 20 MG tablet Take one tablet every other day  . vitamin B-12 1000 MCG tablet Take 1 tablet (1,000 mcg total) by mouth daily.     Allergies:   Amiodarone, Amoxicillin, Cephalosporins, Lipitor [atorvastatin], Lorazepam, Lovenox [enoxaparin sodium], Morphine and related, Penicillins, and Sulfa antibiotics   Social History   Tobacco Use  . Smoking status: Never Smoker  . Smokeless  tobacco: Never Used  Substance Use Topics  . Alcohol use: No    Alcohol/week: 0.0 standard drinks  . Drug use: No     Family Hx: The patient's family history includes Arthritis in an other family member; Cancer in an other family member; Diabetes in her mother; Heart disease in an other family member; Hyperlipidemia in her brother.  ROS:   Please see the history of present illness.     All other systems reviewed and are negative.   Prior CV studies:   The following studies were reviewed today:  Labs/Other Tests and Data Reviewed:    EKG:  No ECG reviewed.  Recent Labs: 06/26/2018: TSH 1.440 02/25/2019: ALT 15; BUN 29; Creatinine, Ser 1.69; Hemoglobin 10.7; Platelets 291; Potassium 4.3; Sodium 143   Recent Lipid Panel Lab Results  Component Value  Date/Time   CHOL 185 07/18/2017 03:57 AM   TRIG 139 07/18/2017 03:57 AM   HDL 72 07/18/2017 03:57 AM   CHOLHDL 2.6 07/18/2017 03:57 AM   LDLCALC 85 07/18/2017 03:57 AM    Wt Readings from Last 3 Encounters:  05/03/19 140 lb (63.5 kg)  02/25/19 140 lb (63.5 kg)  02/17/19 150 lb 12.7 oz (68.4 kg)     Objective:    Vital Signs:  BP (!) 156/80   Ht 5\' 6"  (1.676 m)   Wt 140 lb (63.5 kg)   BMI 22.60 kg/m   Normal affect, normal speech pattern and tone. Comfortable, no apparent distress. No audible signs of SOb or wheezing  ASSESSMENT & PLAN:    1. Afib/aflutter -no recent symptoms - off xarelto due to recent GI bleed. Due to prior head bleed last year and also recent GI bleed I would not restart xarelto. We discussed the risks and benefits of anticoagulation and risk of stroke vs bleeding risk with patient and her granddaughter. As a group we have decided not to restart xarelto due to high bleeding risk   2. LE Edema  controlled, continue to monitor.   COVID-19 Education: The signs and symptoms of COVID-19 were discussed with the patient and how to seek care for testing (follow up with PCP or arrange E-visit).  The  importance of social distancing was discussed today.  Time:   Today, I have spent 13 minutes with the patient with telehealth technology discussing the above problems.     Medication Adjustments/Labs and Tests Ordered: Current medicines are reviewed at length with the patient today.  Concerns regarding medicines are outlined above.   Tests Ordered: No orders of the defined types were placed in this encounter.   Medication Changes: No orders of the defined types were placed in this encounter.   Follow Up:  Either In Person or Virtual in 6 month(s)  Signed, Carlyle Dolly, MD  05/03/2019 1:48 PM

## 2019-05-20 ENCOUNTER — Other Ambulatory Visit: Payer: Self-pay | Admitting: Family Medicine

## 2019-05-20 ENCOUNTER — Other Ambulatory Visit: Payer: Self-pay | Admitting: Cardiology

## 2019-05-30 ENCOUNTER — Other Ambulatory Visit: Payer: Self-pay

## 2019-05-30 ENCOUNTER — Ambulatory Visit (INDEPENDENT_AMBULATORY_CARE_PROVIDER_SITE_OTHER): Payer: Medicare Other | Admitting: Family Medicine

## 2019-05-30 DIAGNOSIS — M25559 Pain in unspecified hip: Secondary | ICD-10-CM

## 2019-05-30 MED ORDER — HYDROCODONE-ACETAMINOPHEN 5-325 MG PO TABS: ORAL_TABLET | ORAL | 0 refills | Status: DC

## 2019-05-30 NOTE — Progress Notes (Signed)
   Subjective:    Patient ID: [name], female    DOB: 10/08/1923, 86 y.o.   MRN: [id number]  HPI  Patient calls to discuss right hip pain for a while. Patient states it used to hurt off and on for a while but now hurst most of the time. Patient with intermittent hip pain discomfort.  Hurts with certain movements.  Hurts when she gets up and walks with a walker.  No known injury no falls.  In addition to this denies any abdominal pain or discomfort relates some back pain PMH benign Virtual Visit via Video Note  I connected with [name] on 05/30/19 at 10:00 AM EST by a video enabled telemedicine application and verified that I am speaking with the correct person using two identifiers.  Location: Patient: home Provider: office   I discussed the limitations of evaluation and management by telemedicine and the availability of in person appointments. The patient expressed understanding and agreed to proceed.  History of Present Illness:    Observations/Objective:   Assessment and Plan:   Follow Up Instructions:    I discussed the assessment and treatment plan with the patient. The patient was provided an opportunity to ask questions and all were answered. The patient agreed with the plan and demonstrated an understanding of the instructions.   The patient was advised to call back or seek an in-person evaluation if the symptoms worsen or if the condition fails to improve as anticipated.  I provided 16 minutes of non-face-to-face time during this encounter.   Fall Risk  03/25/2019 02/25/2019 07/26/2017 02/23/2017 04/19/2016  Falls in the past year? 0 0 No Yes No  Number falls in past yr: - - - 1 -  Injury with Fall? - - - No -  Risk for fall due to : Impaired balance/gait;Impaired mobility History of fall(s);Impaired mobility - Impaired mobility -  Follow up Falls evaluation completed Falls evaluation completed - Education provided -      Review of Systems    Constitutional: Negative for activity change and fever.  HENT: Negative for congestion, ear pain and rhinorrhea.   Eyes: Negative for discharge.  Respiratory: Negative for cough, shortness of breath and wheezing.   Cardiovascular: Negative for chest pain.  Complains of hip pain left side and low back pain     Objective:   Physical Exam   Today's visit was via telephone Physical exam was not possible for this visit      Assessment & Plan:  It is hard to know if this is hip pain or possibly pain that is different from the lower back in addition to this it is possible that this could be a pinched nerve.  During this pandemic I do not think it is a great idea for her to get out to do x-rays for orthopedic visit we will use hydrocodone at low doses to help with severe pain otherwise she is to take Tylenol use the pain medicine sparingly she is using in the past without trouble.  If ongoing troubles or problems to notify us

## 2019-06-18 ENCOUNTER — Other Ambulatory Visit: Payer: Self-pay | Admitting: Family Medicine

## 2019-07-03 DIAGNOSIS — M79671 Pain in right foot: Secondary | ICD-10-CM | POA: Diagnosis not present

## 2019-07-03 DIAGNOSIS — M79672 Pain in left foot: Secondary | ICD-10-CM | POA: Diagnosis not present

## 2019-07-03 DIAGNOSIS — M79674 Pain in right toe(s): Secondary | ICD-10-CM | POA: Diagnosis not present

## 2019-07-03 DIAGNOSIS — B351 Tinea unguium: Secondary | ICD-10-CM | POA: Diagnosis not present

## 2019-07-05 ENCOUNTER — Other Ambulatory Visit: Payer: Self-pay | Admitting: Family Medicine

## 2019-07-05 NOTE — Telephone Encounter (Signed)
Check up 03/25/19

## 2019-07-06 NOTE — Telephone Encounter (Signed)
May have 

## 2019-07-09 NOTE — Telephone Encounter (Signed)
This plus 3 refills each

## 2019-07-18 ENCOUNTER — Telehealth: Payer: Self-pay | Admitting: Family Medicine

## 2019-07-18 DIAGNOSIS — M79605 Pain in left leg: Secondary | ICD-10-CM

## 2019-07-18 DIAGNOSIS — M79604 Pain in right leg: Secondary | ICD-10-CM

## 2019-07-18 NOTE — Telephone Encounter (Signed)
Seen this morning by PA Rosealee Albee. Home health referral due to living by self, bilateral leg pain. Maybe 2 days a week. PA went out to check on patient just as a courtesy/checkup. Pt needs help with meds and ADLs. May call patient and Wells Guiles back informing them of what we will be doing. Please advise. Thank you

## 2019-07-18 NOTE — Telephone Encounter (Signed)
[name] with [facility] (Through patients [employer]) said that a [facility] nurse practitioner went out to see her today and they feel that she is declining and needs a home health referral. [name] that she lives alone and if home health doesn't get involved that she will probably end up in the hospital.  [name] [phone]

## 2019-07-18 NOTE — Telephone Encounter (Signed)
May have home health referral due to bilateral leg pain Please find out from [facility] what aspects do they feel the home health will need to do for the patient?

## 2019-07-19 NOTE — Telephone Encounter (Signed)
Referral placed to home health and pt is aware

## 2019-07-24 ENCOUNTER — Other Ambulatory Visit: Payer: Self-pay | Admitting: Family Medicine

## 2019-07-24 ENCOUNTER — Telehealth: Payer: Self-pay | Admitting: Family Medicine

## 2019-07-24 DIAGNOSIS — Z9181 History of falling: Secondary | ICD-10-CM | POA: Diagnosis not present

## 2019-07-24 DIAGNOSIS — M25551 Pain in right hip: Secondary | ICD-10-CM | POA: Diagnosis not present

## 2019-07-24 DIAGNOSIS — I10 Essential (primary) hypertension: Secondary | ICD-10-CM | POA: Diagnosis not present

## 2019-07-24 DIAGNOSIS — M1711 Unilateral primary osteoarthritis, right knee: Secondary | ICD-10-CM | POA: Diagnosis not present

## 2019-07-24 DIAGNOSIS — Z7982 Long term (current) use of aspirin: Secondary | ICD-10-CM | POA: Diagnosis not present

## 2019-07-24 DIAGNOSIS — I4891 Unspecified atrial fibrillation: Secondary | ICD-10-CM | POA: Diagnosis not present

## 2019-07-24 NOTE — Telephone Encounter (Signed)
Ok plus 3 ref 

## 2019-07-24 NOTE — Telephone Encounter (Signed)
[name] with [facility] calling requesting skilled nursing 1 x 5 weeks and home health aid 2 x 4 weeks.   PT and social work eval.   [name] also needs a brief history about the pt.   CB# [phone]

## 2019-07-24 NOTE — Telephone Encounter (Signed)
Per [place] with [facility] - they will start pt's care but pt will need an office visit in the next 30 days with a note that addresses what is causing the hip pain.   Please advise - Office or virtual

## 2019-07-24 NOTE — Telephone Encounter (Signed)
So this patient is being cared for by [facility] as well.  They are a home physician assistant visit group that does home visits and is actually somehow affiliated with [employer] or something like that So hopefully they can provide the visit note to advance care Please point them in that direction If for some reason that they cannot provide that to the home health group then the patient will need to do a visit with [place] either in person or video-because the system will not allow a phone visit to take the place of face-to-face

## 2019-07-25 ENCOUNTER — Emergency Department (HOSPITAL_COMMUNITY)
Admission: EM | Admit: 2019-07-25 | Discharge: 2019-07-25 | Disposition: A | Discharge status: Home / Self Care | Payer: Medicare Other | Attending: Emergency Medicine | Admitting: Emergency Medicine

## 2019-07-25 ENCOUNTER — Emergency Department (HOSPITAL_COMMUNITY): Payer: Medicare Other

## 2019-07-25 ENCOUNTER — Other Ambulatory Visit: Payer: Self-pay

## 2019-07-25 ENCOUNTER — Encounter (HOSPITAL_COMMUNITY): Payer: Self-pay

## 2019-07-25 ENCOUNTER — Telehealth: Payer: Self-pay | Admitting: *Deleted

## 2019-07-25 DIAGNOSIS — N390 Urinary tract infection, site not specified: Secondary | ICD-10-CM | POA: Diagnosis not present

## 2019-07-25 DIAGNOSIS — I10 Essential (primary) hypertension: Secondary | ICD-10-CM | POA: Diagnosis not present

## 2019-07-25 DIAGNOSIS — R109 Unspecified abdominal pain: Secondary | ICD-10-CM | POA: Diagnosis not present

## 2019-07-25 DIAGNOSIS — R1110 Vomiting, unspecified: Secondary | ICD-10-CM | POA: Insufficient documentation

## 2019-07-25 DIAGNOSIS — K439 Ventral hernia without obstruction or gangrene: Secondary | ICD-10-CM | POA: Diagnosis not present

## 2019-07-25 DIAGNOSIS — Z79899 Other long term (current) drug therapy: Secondary | ICD-10-CM | POA: Diagnosis not present

## 2019-07-25 DIAGNOSIS — R1032 Left lower quadrant pain: Secondary | ICD-10-CM | POA: Diagnosis present

## 2019-07-25 LAB — URINALYSIS, ROUTINE W REFLEX MICROSCOPIC
Bilirubin Urine: NEGATIVE
Glucose, UA: NEGATIVE mg/dL
Hgb urine dipstick: NEGATIVE
Ketones, ur: NEGATIVE mg/dL
Nitrite: POSITIVE — AB
Protein, ur: NEGATIVE mg/dL
Specific Gravity, Urine: 1.005 (ref 1.005–1.030)
pH: 5 (ref 5.0–8.0)

## 2019-07-25 LAB — COMPREHENSIVE METABOLIC PANEL
ALT: 17 U/L (ref 0–44)
AST: 24 U/L (ref 15–41)
Albumin: 4.2 g/dL (ref 3.5–5.0)
Alkaline Phosphatase: 55 U/L (ref 38–126)
Anion gap: 12 (ref 5–15)
BUN: 29 mg/dL — ABNORMAL HIGH (ref 8–23)
CO2: 27 mmol/L (ref 22–32)
Calcium: 9.7 mg/dL (ref 8.9–10.3)
Chloride: 100 mmol/L (ref 98–111)
Creatinine, Ser: 1.45 mg/dL — ABNORMAL HIGH (ref 0.44–1.00)
GFR calc Af Amer: 36 mL/min — ABNORMAL LOW (ref >60)
GFR calc non Af Amer: 31 mL/min — ABNORMAL LOW (ref >60)
Glucose, Bld: 124 mg/dL — ABNORMAL HIGH (ref 70–99)
Potassium: 4.5 mmol/L (ref 3.5–5.1)
Sodium: 139 mmol/L (ref 135–145)
Total Bilirubin: 1 mg/dL (ref 0.3–1.2)
Total Protein: 7.4 g/dL (ref 6.5–8.1)

## 2019-07-25 LAB — CBC WITH DIFFERENTIAL/PLATELET
Abs Immature Granulocytes: 0.04 10*3/uL (ref 0.00–0.07)
Basophils Absolute: 0 10*3/uL (ref 0.0–0.1)
Basophils Relative: 0 %
Eosinophils Absolute: 0.1 10*3/uL (ref 0.0–0.5)
Eosinophils Relative: 1 %
HCT: 37.9 % (ref 36.0–46.0)
Hemoglobin: 11.9 g/dL — ABNORMAL LOW (ref 12.0–15.0)
Immature Granulocytes: 0 %
Lymphocytes Relative: 17 %
Lymphs Abs: 2 10*3/uL (ref 0.7–4.0)
MCH: 28.6 pg (ref 26.0–34.0)
MCHC: 31.4 g/dL (ref 30.0–36.0)
MCV: 91.1 fL (ref 80.0–100.0)
Monocytes Absolute: 0.8 10*3/uL (ref 0.1–1.0)
Monocytes Relative: 6 %
Neutro Abs: 9.2 10*3/uL — ABNORMAL HIGH (ref 1.7–7.7)
Neutrophils Relative %: 76 %
Platelets: 251 10*3/uL (ref 150–400)
RBC: 4.16 MIL/uL (ref 3.87–5.11)
RDW: 12.5 % (ref 11.5–15.5)
WBC: 12.2 10*3/uL — ABNORMAL HIGH (ref 4.0–10.5)
nRBC: 0 % (ref 0.0–0.2)

## 2019-07-25 LAB — LIPASE, BLOOD: Lipase: 27 U/L (ref 11–51)

## 2019-07-25 MED ORDER — CIPROFLOXACIN HCL 500 MG PO TABS: 500.0000 mg | ORAL_TABLET | Freq: Two times a day (BID) | ORAL | 0 refills | Status: DC

## 2019-07-25 MED ORDER — ONDANSETRON HCL 4 MG/2ML IJ SOLN
4.0000 mg | Freq: Once | INTRAMUSCULAR | Status: AC
  Administered 2019-07-25: 4 mg via INTRAVENOUS
  Filled 2019-07-25: qty 2

## 2019-07-25 MED ORDER — IOHEXOL 300 MG/ML  SOLN
75.0000 mL | Freq: Once | INTRAMUSCULAR | Status: AC | PRN
  Administered 2019-07-25: 75 mL via INTRAVENOUS

## 2019-07-25 MED ORDER — SODIUM CHLORIDE 0.9 % IV BOLUS
500.0000 mL | Freq: Once | INTRAVENOUS | Status: AC
  Administered 2019-07-25: 500 mL via INTRAVENOUS

## 2019-07-25 MED ORDER — CIPROFLOXACIN IN D5W 400 MG/200ML IV SOLN
400.0000 mg | Freq: Once | INTRAVENOUS | Status: AC
  Administered 2019-07-25: 400 mg via INTRAVENOUS
  Filled 2019-07-25: qty 200

## 2019-07-25 NOTE — Telephone Encounter (Signed)
Pt called and states she has been having right upper side pain for one year. Worse for the past 2 weeks. Pain is constant but worse when moving or walking. Pt states she has vomited twice today. Pt did not want to go to ED. Consult with Dr. Richardson Landry and pt does need to go to ED. I advised pt that she needs to go to ED right away and she states she will have someone take her to ED.

## 2019-07-25 NOTE — Discharge Instructions (Addendum)
You have a urinary tract infection.  Prescription for antibiotics sent to your pharmacy.  Increase fluids.  Additionally you have a hernia in your abdomen.  Recommend follow-up with general surgery.  Phone number given.  Return for vomiting increased abdominal pain, fever, chills.

## 2019-07-25 NOTE — ED Provider Notes (Signed)
[facility] Provider Note   CSN: [id number] Arrival date & time: 07/25/19  1029     History Chief Complaint  Patient presents with  . Abdominal Pain    [name] is a 86 y.o. female.  Right lower quadrant pain with radiation to the left lower quadrant for the past year, worse in the past few weeks.  1 episode of vomiting today.  No fever, sweats, chills, diarrhea, cough, chest pain, dyspnea.Marland Kitchen  Positive dysuria.  Severity mild to moderate.  Nothing makes symptoms better or worse.  Patient lives at home with help from her son.        Past Medical History:  Diagnosis Date  . Abdominal pain, chronic, right lower quadrant    "ever since I had my appendix removed"  . Atrial fibrillation ([place])   . Breast cancer ([place])   . Cancer ([place])    left side  . Chronic back pain   . Edema   . GERD (gastroesophageal reflux disease)   . Hyperlipidemia   . Hypertension   . Osteopenia   . Osteoporosis   . Ovarian cyst 02/2015   left  . Recurrent abdominal pain    LLQ    Patient Active Problem List   Diagnosis Date Noted  . GIB (gastrointestinal bleeding) 02/17/2019  . GI bleed 02/16/2019  . HLD (hyperlipidemia) 07/18/2017  . B12 deficiency 07/18/2017  . SAH (subarachnoid hemorrhage) ([place]) 07/17/2017  . Hypertensive urgency 07/16/2017  . ICH (intracerebral hemorrhage) ([place]) 07/16/2017  . Cervical disc disease 05/09/2017  . Hypothyroid 05/09/2017  . Major depression in partial remission ([place]) 03/17/2016  . Atrial fibrillation ([place]) 01/12/2016  . Osteoarthritis of right knee 12/15/2015  . Major depression 09/22/2015  . Ventral hernia 05/21/2015  . Ovarian tumor 04/13/2015  . Squamous cell skin cancer 08/13/2014  . Impaired fasting glucose 08/05/2013  . Neuropathy 01/11/2013  . Long term (current) use of anticoagulants 07/27/2012  . Atrial fibrillation with RVR ([place]) 01/29/2012  . Anxiety 01/29/2012  . GERD (gastroesophageal reflux disease) 01/29/2012   . Chronic anticoagulation 01/29/2012  . Sciatica of right side 05/10/2011    Past Surgical History:  Procedure Laterality Date  . APPENDECTOMY    . APPENDECTOMY    . BREAST SURGERY Left    lumpectomy  . CARDIOVERSION N/A 03/31/2016   Procedure: CARDIOVERSION;  Surgeon: Josue Hector, MD;  Location: [facility];  Service: Cardiovascular;  Laterality: N/A;  . COLON RESECTION    . COLON SURGERY    . HERNIA REPAIR    . HERNIA REPAIR       OB History    Gravida  2   Para  1   Term      Preterm      AB  1   Living  1     SAB  1   TAB      Ectopic      Multiple      Live Births              Family History  Problem Relation Age of Onset  . Diabetes Mother   . Heart disease Other   . Arthritis Other   . Cancer Other   . Hyperlipidemia Brother     Social History   Tobacco Use  . Smoking status: Never Smoker  . Smokeless tobacco: Never Used  Substance Use Topics  . Alcohol use: No    Alcohol/week: 0.0 standard drinks  . Drug use: No  Home Medications Prior to Admission medications   Medication Sig Start Date End Date Taking? Authorizing Provider  acetaminophen (TYLENOL) 325 MG tablet Take 2 tablets (650 mg total) by mouth every 6 (six) hours as needed for mild pain (or Fever >/= 101). 02/18/19   Roxan Hockey, MD  ALPRAZolam Duanne Moron) 0.25 MG tablet TAKE 1 TABLET BY MOUTH TWICE DAILY AS NEEDED FOR ANXIETY OR SLEEP. 07/25/19   Mikey Kirschner, MD  calcium carbonate (TUMS - DOSED IN MG ELEMENTAL CALCIUM) 500 MG chewable tablet Chew 1 tablet by mouth daily.     [provider]  ciprofloxacin (CIPRO) 500 MG tablet Take 1 tablet (500 mg total) by mouth 2 (two) times daily. 07/25/19   Nat Christen, MD  diltiazem (CARDIZEM CD) 120 MG 24 hr capsule TAKE (1) CAPSULE BY MOUTH ONCE DAILY. 05/20/19   Satira Sark, MD  HYDROcodone-acetaminophen (NORCO/VICODIN) 5-325 MG tablet 1/2 tablet every 12 hours as needed use sparingly 05/30/19   [name], MD    HYDROcodone-acetaminophen (NORCO/VICODIN) 5-325 MG tablet 1/2 tablet every 12 hours as needed use sparingly 05/30/19   Luking, Elayne Snare, MD  hydrocortisone (ANUSOL-HC) 25 MG suppository Place 1 suppository (25 mg total) rectally 2 (two) times daily. For hemorrhoids 02/18/19   Roxan Hockey, MD  levothyroxine (SYNTHROID) 75 MCG tablet TAKE ONE TABLET BY MOUTH ONCE DAILY. 07/09/19   [name], MD  lisinopril (ZESTRIL) 2.5 MG tablet TAKE 1 TABLET BY MOUTH ONCE A DAY. 06/18/19   Mikey Kirschner, MD  metoprolol succinate (TOPROL-XL) 50 MG 24 hr tablet TAKE 1 TABLET BY MOUTH DAILY. TAKE WITH OR IMMEDIATELY FOLLOWING A MEAL. 05/20/19   [name], MD  Multiple Vitamin (MULTIVITAMIN) tablet Take 1 tablet by mouth daily.    [provider]  ondansetron (ZOFRAN-ODT) 4 MG disintegrating tablet Take 1 tablet (4 mg total) by mouth every 8 (eight) hours as needed for nausea or vomiting. 06/22/18   Davonna Belling, MD  pantoprazole (PROTONIX) 40 MG tablet Take 1 tablet (40 mg total) by mouth daily. 12/21/18   [name], MD  potassium chloride (KLOR-CON) 10 MEQ tablet TAKE ONE TABLET BY MOUTH ONCE DAILY. 07/09/19   [name], MD  sertraline (ZOLOFT) 50 MG tablet TAKE 1 TABLET BY MOUTH ONCE A DAY. 07/09/19   [name], MD  torsemide (DEMADEX) 20 MG tablet Take one tablet every other day 03/11/19   [name], MD  vitamin B-12 1000 MCG tablet Take 1 tablet (1,000 mcg total) by mouth daily. 07/21/17   Mary Sella, NP    Allergies    Amiodarone, Amoxicillin, Cephalosporins, Lipitor [atorvastatin], Lorazepam, Lovenox [enoxaparin sodium], Morphine and related, Penicillins, and Sulfa antibiotics  Review of Systems   Review of Systems  All other systems reviewed and are negative.   Physical Exam Updated Vital Signs BP (!) 154/70 (BP Location: Left Arm)   Pulse 80   Temp 98.5 F (36.9 C) (Oral)   Resp 18   Ht 5\' 4"  (1.626 m)   Wt 63.5 kg   SpO2 93%   BMI 24.03  kg/m   Physical Exam Vitals and nursing note reviewed.  Constitutional:      Appearance: She is well-developed.     Comments: nad  HENT:     Head: Normocephalic and atraumatic.  Eyes:     Conjunctiva/sclera: Conjunctivae normal.  Cardiovascular:     Rate and Rhythm: Normal rate and regular rhythm.  Pulmonary:  Effort: Pulmonary effort is normal.     Breath sounds: Normal breath sounds.  Abdominal:     General: Bowel sounds are normal.     Palpations: Abdomen is soft.     Comments: Abdomen palpated; nontender  Musculoskeletal:        General: Normal range of motion.     Cervical back: Neck supple.  Skin:    General: Skin is warm and dry.  Neurological:     General: No focal deficit present.     Mental Status: She is alert and oriented to person, place, and time.  Psychiatric:        Behavior: Behavior normal.     ED Results / Procedures / Treatments   Labs (all labs ordered are listed, but only abnormal results are displayed) Labs Reviewed  CBC WITH DIFFERENTIAL/PLATELET - Abnormal; Notable for the following components:      Result Value   WBC 12.2 (*)    Hemoglobin 11.9 (*)    Neutro Abs 9.2 (*)    All other components within normal limits  COMPREHENSIVE METABOLIC PANEL - Abnormal; Notable for the following components:   Glucose, Bld 124 (*)    BUN 29 (*)    Creatinine, Ser 1.45 (*)    GFR calc non Af Amer 31 (*)    GFR calc Af Amer 36 (*)    All other components within normal limits  URINALYSIS, ROUTINE W REFLEX MICROSCOPIC - Abnormal; Notable for the following components:   Color, Urine STRAW (*)    Nitrite POSITIVE (*)    Leukocytes,Ua MODERATE (*)    Bacteria, UA MANY (*)    All other components within normal limits  URINE CULTURE  LIPASE, BLOOD    EKG None  Radiology CT Abdomen Pelvis W Contrast  Result Date: 07/25/2019 CLINICAL DATA:  RIGHT upper quadrant abdominal pain radiating to RIGHT flank for 1 year, some radiation of pain to LEFT lower  quadrant, vomited once today; history hypertension, GERD, hernia repair, colon resection, appendectomy LEFT breast cancer post lumpectomy, atrial fibrillation EXAM: CT ABDOMEN AND PELVIS WITH CONTRAST TECHNIQUE: Multidetector CT imaging of the abdomen and pelvis was performed using the standard protocol following bolus administration of intravenous contrast. Sagittal and coronal MPR images reconstructed from axial data set. CONTRAST:  58mL OMNIPAQUE IOHEXOL 300 MG/ML SOLN IV. No oral contrast. COMPARISON:  06/22/2018 FINDINGS: Lower chest: Minimal dependent atelectasis at lower lobes. Peribronchial thickening. Hepatobiliary: Gallbladder and liver normal appearance Pancreas: Normal appearance Spleen: Normal appearance Adrenals/Urinary Tract: Adrenal glands normal appearance. Cortical thinning of the kidney slightly greater on RIGHT. Extrarenal pelvis and small cyst at LEFT kidney. Kidneys, ureters and bladder otherwise unremarkable. Stomach/Bowel: Appendix surgically absent by history. Extension of a small bowel loop into a small anterior ventral pelvic hernia; minimal infiltration of adjacent fat could reflect incarceration. No evidence of bowel obstruction or perforation. Diverticulosis of the distal descending and sigmoid colon without definite evidence of acute diverticulitis. Few additional scattered RIGHT colonic diverticula. Large duodenal diverticulum adjacent to pancreatic head. Stomach and bowel loops otherwise normal appearance. Vascular/Lymphatic: Extensive atherosclerotic calcifications aorta, visceral arteries, iliac arteries, coronary arteries. Aorta normal caliber. No adenopathy. Reproductive: Atrophic uterus with unremarkable ovaries Other: No free air or free fluid. Musculoskeletal: Bones demineralized. Mild scattered degenerative disc and facet disease changes of the lumbar spine. IMPRESSION: Extension of a small bowel loop into a ventral pelvic hernia, with associated minimal surrounding  infiltrative change, nonspecific but could represent inflammation or incarceration, recommend clinical correlation. Scattered  colonic diverticulosis greatest at sigmoid colon without definite evidence of diverticulitis. Extensive atherosclerotic disease changes including coronary arteries and visceral arteries. Aortic Atherosclerosis (ICD10-I70.0). Electronically Signed   By: Lavonia Dana M.D.   On: 07/25/2019 13:20    Procedures Procedures (including critical care time)  Medications Ordered in ED Medications  ciprofloxacin (CIPRO) IVPB 400 mg (0 mg Intravenous Stopped 07/25/19 1354)  iohexol (OMNIPAQUE) 300 MG/ML solution 75 mL (75 mLs Intravenous Contrast Given 07/25/19 1229)  sodium chloride 0.9 % bolus 500 mL (500 mLs Intravenous New Bag/Given 07/25/19 1357)  ondansetron (ZOFRAN) injection 4 mg (4 mg Intravenous Given 07/25/19 1358)    ED Course  I have reviewed the triage vital signs and the nursing notes.  Pertinent labs & imaging results that were available during my care of the patient were reviewed by me and considered in my medical decision making (see chart for details).    MDM Rules/Calculators/A&P                      Patient is in no acute distress.  No evidence of a surgical abdomen.  CT scan reviewed and abnormal findings noted.  No clinical evidence of incarceration.  However, urinalysis shows evidence of infection.  Because of her multiple allergies, will start ciprofloxacin.  Patient rechecked several times and is stable at discharge. Final Clinical Impression(s) / ED Diagnoses Final diagnoses:  Urinary tract infection without hematuria, site unspecified  Ventral hernia without obstruction or gangrene    Rx / DC Orders ED Discharge Orders         Ordered    ciprofloxacin (CIPRO) 500 MG tablet  2 times daily     07/25/19 1444           Nat Christen, MD 07/25/19 1450

## 2019-07-25 NOTE — ED Triage Notes (Signed)
Pt reports has had pain in r flank radiating around to r side of abd.  Reports has had this pain for the past year and has had ct scans in the past.  PT says today she vomited and her pcp office told her to come to ed.  Pt also c/o pain in left leg.

## 2019-07-27 LAB — URINE CULTURE: Culture: 80000 — AB

## 2019-07-28 ENCOUNTER — Telehealth: Payer: Self-pay | Admitting: Emergency Medicine

## 2019-07-28 NOTE — Telephone Encounter (Signed)
Please go ahead with that request thank you

## 2019-07-28 NOTE — Telephone Encounter (Signed)
Post ED Visit - Positive Culture Follow-up  Culture report reviewed by antimicrobial stewardship pharmacist: [facility] Team []  Elenor Quinones, Pharm.D. []  Heide Guile, Pharm.D., BCPS AQ-ID []  Parks Neptune, Pharm.D., BCPS []  Alycia Rossetti, Pharm.D., BCPS []  [place], Pharm.D., BCPS, AAHIVP []  Legrand Como, Pharm.D., BCPS, AAHIVP [x]  Salome Arnt, PharmD, BCPS []  Johnnette Gourd, PharmD, BCPS []  Hughes Better, PharmD, BCPS []  Leeroy Cha, PharmD []  Laqueta Linden, PharmD, BCPS []  Albertina Parr, PharmD  [place] Team []  Leodis Sias, PharmD []  Lindell Spar, PharmD []  Royetta Asal, PharmD []  Graylin Shiver, Rph []  Rema Fendt) Glennon Mac, PharmD []  Arlyn Dunning, PharmD []  Netta Cedars, PharmD []  Dia Sitter, PharmD []  Leone Haven, PharmD []  Gretta Arab, PharmD []  Theodis Shove, PharmD []  Peggyann Juba, PharmD []  Reuel Boom, PharmD   Positive urine culture Treated with Ciprofloxacin, organism sensitive to the same and no further patient follow-up is required at this time.  [name] Eustice 07/28/2019, 3:18 PM

## 2019-07-29 NOTE — Telephone Encounter (Signed)
Larene Beach returned call and verbal orders given for skilled nursing, home health aide, PT and social work eval.

## 2019-07-29 NOTE — Telephone Encounter (Signed)
Left message to return call 

## 2019-07-30 ENCOUNTER — Other Ambulatory Visit: Payer: Self-pay

## 2019-07-30 ENCOUNTER — Encounter: Payer: Self-pay | Admitting: General Surgery

## 2019-07-30 ENCOUNTER — Ambulatory Visit: Payer: Medicare Other | Admitting: General Surgery

## 2019-07-30 VITALS — BP 162/63 | HR 82 | Temp 97.9°F | Resp 14 | Ht 64.0 in | Wt 140.0 lb

## 2019-07-30 DIAGNOSIS — M25551 Pain in right hip: Secondary | ICD-10-CM | POA: Diagnosis not present

## 2019-07-30 DIAGNOSIS — K439 Ventral hernia without obstruction or gangrene: Secondary | ICD-10-CM

## 2019-07-30 NOTE — Patient Instructions (Addendum)
Right hip pain ? Osteoporosis / osteoarthritis? - DEXA (bone scan in 2010 showed osteopenia (weakened bones))   You do have a hernia but this is not causing your pain and risk of surgery out weigh benefits.   Ventral Hernia  A ventral hernia is a bulge of tissue from inside the abdomen that pushes through a weak area of the muscles that form the front wall of the abdomen. The tissues inside the abdomen are inside a sac (peritoneum). These tissues include the small intestine, large intestine, and the fatty tissue that covers the intestines (omentum). Sometimes, the bulge that forms a hernia contains intestines. Other hernias contain only fat. Ventral hernias do not go away without surgical treatment. There are several types of ventral hernias. You may have:  A hernia at an incision site from previous abdominal surgery (incisional hernia).  A hernia just above the belly button (epigastric hernia), or at the belly button (umbilical hernia). These types of hernias can develop from heavy lifting or straining.  A hernia that comes and goes (reducible hernia). It may be visible only when you lift or strain. This type of hernia can be pushed back into the abdomen (reduced).  A hernia that traps abdominal tissue inside the hernia (incarcerated hernia). This type of hernia does not reduce.  A hernia that cuts off blood flow to the tissues inside the hernia (strangulated hernia). The tissues can start to die if this happens. This is a very painful bulge that cannot be reduced. A strangulated hernia is a medical emergency. What are the causes? This condition is caused by abdominal tissue putting pressure on an area of weakness in the abdominal muscles. What increases the risk? The following factors may make you more likely to develop this condition:  Being female.  Being 81 or older.  Being overweight or obese.  Having had previous abdominal surgery, especially if there was an infection after  surgery.  Having had an injury to the abdominal wall.  Having had several pregnancies.  Having a buildup of fluid inside the abdomen (ascites). What are the signs or symptoms? The only symptom of a ventral hernia may be a painless bulge in the abdomen. A reducible hernia may be visible only when you strain, cough, or lift. Other symptoms may include:  Dull pain.  A feeling of pressure. Signs and symptoms of a strangulated hernia may include:  Increasing pain.  Nausea and vomiting.  Pain when pressing on the hernia.  The skin over the hernia turning red or purple.  Constipation.  Blood in the stool (feces). How is this diagnosed? This condition may be diagnosed based on:  Your symptoms.  Your medical history.  A physical exam. You may be asked to cough or strain while standing. These actions increase the pressure inside your abdomen and force the hernia through the opening in your muscles. Your health care provider may try to reduce the hernia by pressing on it.  Imaging studies, such as an ultrasound or CT scan. How is this treated? This condition is treated with surgery. If you have a strangulated hernia, surgery is done as soon as possible. If your hernia is small and not incarcerated, you may be asked to lose some weight before surgery. Follow these instructions at home:  Follow instructions from your health care provider about eating or drinking restrictions.  If you are overweight, your health care provider may recommend that you increase your activity level and eat a healthier diet.  Do not  lift anything that is heavier than 10 lb (4.5 kg).  Return to your normal activities as told by your health care provider. Ask your health care provider what activities are safe for you. You may need to avoid activities that increase pressure on your hernia.  Take over-the-counter and prescription medicines only as told by your health care provider.  Keep all follow-up  visits as told by your health care provider. This is important. Contact a health care provider if:  Your hernia gets larger.  Your hernia becomes painful. Get help right away if:  Your hernia becomes increasingly painful.  You have pain along with any of the following: ? Changes in skin color in the area of the hernia. ? Nausea. ? Vomiting. ? Fever. Summary  A ventral hernia is a bulge of tissue from inside the abdomen that pushes through a weak area of the muscles that form the front wall of the abdomen.  This condition is treated with surgery, which may be urgent depending on your hernia.  Do not lift anything that is heavier than 10 lb (4.5 kg), and follow activity instructions from your health care provider. This information is not intended to replace advice given to you by your health care provider. Make sure you discuss any questions you have with your health care provider. Document Revised: 06/21/2017 Document Reviewed: 11/28/2016 Elsevier Patient Education  [place].

## 2019-07-30 NOTE — Progress Notes (Signed)
[facility] History and Physical   Reason for Referral: Ventral hernia  Referring Physician: Dr. Lacinda Axon (ED)   Chief Complaint    New Patient (Initial Visit)      [name] is a 86 y.o. female.  HPI: Ms.  [name] is a sweet 86 yo who lives at home alone but does not cook or clean. She is able to ambulate with a walker and make transitions from a lift/ recliner to chairs and uses the restroom on her own. She stays at home alone during the day and has some assistance. She is here today with her son. They have come due to complaints from the patient of right sided pain. She has been to the ED with this pain and says that over the last few months this pain has been getting worse. She also reports this odd intermittent nausea and vomiting that comes cyclic every 6 weeks or so.   At the Ed she had a CT performed that did demonstrate a ventral hernia with small bowel that was not obstructed or compromised.   In the last few months she has started having worsening pain all over. She points to her right hip area and says this has hurt chronically for years, and has some radiation to the left. She has an extensive operative history. She had an open sigmoid colectomy for diverticulitis/ abscess at some point, hernia repair in 2002 that was a tissue repair followed by an open appendectomy in 2003 and a subsequent open ventral hernia repair with mesh in 2008 all by Dr. Tamala Julian.    Past Medical History:  Diagnosis Date  . Abdominal pain, chronic, right lower quadrant    "ever since I had my appendix removed"  . Atrial fibrillation ([place])   . Breast cancer ([place])   . Cancer ([place])    left side  . Chronic back pain   . Edema   . GERD (gastroesophageal reflux disease)   . Hyperlipidemia   . Hypertension   . Osteopenia   . Osteoporosis   . Ovarian cyst 02/2015   left  . Recurrent abdominal pain    LLQ    Past Surgical History:  Procedure Laterality Date  . APPENDECTOMY    .  APPENDECTOMY    . BREAST SURGERY Left    lumpectomy  . CARDIOVERSION N/A 03/31/2016   Procedure: CARDIOVERSION;  Surgeon: Josue Hector, MD;  Location: [facility];  Service: Cardiovascular;  Laterality: N/A;  . COLON RESECTION    . COLON SURGERY    . HERNIA REPAIR    . HERNIA REPAIR      Family History  Problem Relation Age of Onset  . Diabetes Mother   . Heart disease Other   . Arthritis Other   . Cancer Other   . Hyperlipidemia Brother     Social History   Tobacco Use  . Smoking status: Never Smoker  . Smokeless tobacco: Never Used  Substance Use Topics  . Alcohol use: No    Alcohol/week: 0.0 standard drinks  . Drug use: No    Medications: I have reviewed the patient's current medications. Allergies as of 07/30/2019      Reactions   Amiodarone Itching   Amoxicillin Hives   Cephalosporins Itching   Lipitor [atorvastatin] Other (See Comments)   Unknown:patient is not aware of this allergy   Lorazepam Other (See Comments)   Exceptional fatigue   Lovenox [enoxaparin Sodium] Nausea And Vomiting   Morphine And Related Itching  Penicillins Swelling   Has patient had a PCN reaction causing immediate rash, facial/tongue/throat swelling, SOB or lightheadedness with hypotension: unknown Has patient had a PCN reaction causing severe rash involving mucus membranes or skin necrosis: unknown Has patient had a PCN reaction that required hospitalization No Has patient had a PCN reaction occurring within the last 10 years: no If all of the above answers are "NO", then may proceed with Cephalosporin use.   Sulfa Antibiotics Swelling      Medication List       Accurate as of July 30, 2019 11:06 AM. If you have any questions, ask your nurse or doctor.        acetaminophen 325 MG tablet Commonly known as: TYLENOL Take 2 tablets (650 mg total) by mouth every 6 (six) hours as needed for mild pain (or Fever >/= 101).   ALPRAZolam 0.25 MG tablet Commonly known as: XANAX TAKE 1  TABLET BY MOUTH TWICE DAILY AS NEEDED FOR ANXIETY OR SLEEP.   calcium carbonate 500 MG chewable tablet Commonly known as: TUMS - dosed in mg elemental calcium Chew 1 tablet by mouth daily.   ciprofloxacin 500 MG tablet Commonly known as: CIPRO Take 1 tablet (500 mg total) by mouth 2 (two) times daily.   Compro 25 MG suppository Generic drug: prochlorperazine INSERT 1 SUPPOSITORY RECTALLY TWICE DAILY AS NEEDED FOR HEMORRHOIDS   cyanocobalamin 1000 MCG tablet Take 1 tablet (1,000 mcg total) by mouth daily.   diltiazem 120 MG 24 hr capsule Commonly known as: CARDIZEM CD TAKE (1) CAPSULE BY MOUTH ONCE DAILY.   HYDROcodone-acetaminophen 5-325 MG tablet Commonly known as: NORCO/VICODIN 1/2 tablet every 12 hours as needed use sparingly What changed: Another medication with the same name was removed. Continue taking this medication, and follow the directions you see here. Changed by: Virl Cagey, MD   hydrocortisone 25 MG suppository Commonly known as: ANUSOL-HC Place 1 suppository (25 mg total) rectally 2 (two) times daily. For hemorrhoids   levothyroxine 75 MCG tablet Commonly known as: SYNTHROID TAKE ONE TABLET BY MOUTH ONCE DAILY.   lisinopril 2.5 MG tablet Commonly known as: ZESTRIL TAKE 1 TABLET BY MOUTH ONCE A DAY.   metoprolol succinate 50 MG 24 hr tablet Commonly known as: TOPROL-XL TAKE 1 TABLET BY MOUTH DAILY. TAKE WITH OR IMMEDIATELY FOLLOWING A MEAL.   multivitamin tablet Take 1 tablet by mouth daily.   ondansetron 4 MG disintegrating tablet Commonly known as: ZOFRAN-ODT Take 1 tablet (4 mg total) by mouth every 8 (eight) hours as needed for nausea or vomiting.   pantoprazole 40 MG tablet Commonly known as: PROTONIX Take 1 tablet (40 mg total) by mouth daily.   potassium chloride 10 MEQ tablet Commonly known as: KLOR-CON TAKE ONE TABLET BY MOUTH ONCE DAILY.   sertraline 50 MG tablet Commonly known as: ZOLOFT TAKE 1 TABLET BY MOUTH ONCE A DAY.     torsemide 20 MG tablet Commonly known as: DEMADEX Take one tablet every other day        ROS:  A comprehensive review of systems was negative except for: Genitourinary: positive for frequency Musculoskeletal: positive for back pain and right hip pain  Endocrine: positive for tired/ sluggish  Blood pressure (!) 162/63, pulse 82, temperature 97.9 F (36.6 C), temperature source Oral, resp. rate 14, height 5\' 4"  (1.626 m), weight 140 lb (63.5 kg), SpO2 94 %. Physical Exam Vitals reviewed.  Constitutional:      Appearance: She is normal weight.  HENT:  Head: Normocephalic.     Nose: Nose normal.     Mouth/Throat:     Mouth: Mucous membranes are moist.  Eyes:     Pupils: Pupils are equal, round, and reactive to light.  Cardiovascular:     Rate and Rhythm: Normal rate.  Pulmonary:     Effort: Pulmonary effort is normal.     Breath sounds: Normal breath sounds.  Abdominal:     General: There is no distension.     Palpations: Abdomen is soft.     Tenderness: There is abdominal tenderness. There is no guarding or rebound.     Hernia: A hernia is present.     Comments: Some minor abdominal tenderness with reduction of hernia at lower scar cross section  Musculoskeletal:        General: No swelling.     Cervical back: Normal range of motion.     Comments: Right hip tenderness to palpation over the bony prominence   Skin:    General: Skin is warm.  Neurological:     General: No focal deficit present.     Mental Status: She is alert and oriented to person, place, and time.  Psychiatric:        Mood and Affect: Mood normal.        Behavior: Behavior normal.        Thought Content: Thought content normal.        Judgment: Judgment normal.     Results: personally reviewed CT from 07/2019, 1/ 2020, and CT from 2007 - Small bowel herniated through ventral hernia in 2020 and 2021, no obstruction or compromise,  Bones are demineralized and no obvious fractures on right hip, 2007  can see area where hernia developing/ patulous appearing abdominal wall where mesh appears to be invaginating into a hernia scan and some loops of bowel tracking   Ct a/p 07/2019 -  CLINICAL DATA:  RIGHT upper quadrant abdominal pain radiating to RIGHT flank for 1 year, some radiation of pain to LEFT lower quadrant, vomited once today; history hypertension, GERD, hernia repair, colon resection, appendectomy LEFT breast cancer post lumpectomy, atrial fibrillation  EXAM: CT ABDOMEN AND PELVIS WITH CONTRAST  TECHNIQUE: Multidetector CT imaging of the abdomen and pelvis was performed using the standard protocol following bolus administration of intravenous contrast. Sagittal and coronal MPR images reconstructed from axial data set.  CONTRAST:  19mL OMNIPAQUE IOHEXOL 300 MG/ML SOLN IV. No oral contrast.  COMPARISON:  06/22/2018  FINDINGS: Lower chest: Minimal dependent atelectasis at lower lobes. Peribronchial thickening.  Hepatobiliary: Gallbladder and liver normal appearance  Pancreas: Normal appearance  Spleen: Normal appearance  Adrenals/Urinary Tract: Adrenal glands normal appearance. Cortical thinning of the kidney slightly greater on RIGHT. Extrarenal pelvis and small cyst at LEFT kidney. Kidneys, ureters and bladder otherwise unremarkable.  Stomach/Bowel: Appendix surgically absent by history. Extension of a small bowel loop into a small anterior ventral pelvic hernia; minimal infiltration of adjacent fat could reflect incarceration. No evidence of bowel obstruction or perforation. Diverticulosis of the distal descending and sigmoid colon without definite evidence of acute diverticulitis. Few additional scattered RIGHT colonic diverticula. Large duodenal diverticulum adjacent to pancreatic head. Stomach and bowel loops otherwise normal appearance.  Vascular/Lymphatic: Extensive atherosclerotic calcifications aorta, visceral arteries, iliac arteries,  coronary arteries. Aorta normal caliber. No adenopathy.  Reproductive: Atrophic uterus with unremarkable ovaries  Other: No free air or free fluid.  Musculoskeletal: Bones demineralized. Mild scattered degenerative disc and facet disease changes of the lumbar spine.  IMPRESSION: Extension of a small bowel loop into a ventral pelvic hernia, with associated minimal surrounding infiltrative change, nonspecific but could represent inflammation or incarceration, recommend clinical correlation.  Scattered colonic diverticulosis greatest at sigmoid colon without definite evidence of diverticulitis.  Extensive atherosclerotic disease changes including coronary arteries and visceral arteries.  Aortic Atherosclerosis (ICD10-I70.0).   Electronically Signed   By: Lavonia Dana M.D.   On: 07/25/2019 13:20  DEXA scan 2010- Report says osteopenia of bilateral hips   Assessment & Plan:  KERAH NEUGENT is a 86 y.o. female with symptoms mostly of right hip pain and full body pain that has been getting worse in the last year. She has had some chronic right sided pain. She denies any traumas or falls to this area.  She has demineralized bone and DEXA In 2010 with osteopenia so I am sure she has osteoporosis now.  She is 68 with A fib, no on anticoagulation due to fall risk and subsequent EKGs with sinus rhythm. She does have a ventral hernia after 4+ open abdominal surgeries/ hernia repairs. She is not obstructed from this and really has no pain complaints that are related to the location of the hernia.  I am unsure why she gets nauseated and vomits every 6 weeks and yes she is at risk for incarceration but her history does not support this at this time.  I have warned her of the symptoms of incarceration and bowel obstruction and told her to go to the ED with those symptoms.   We discussed that surgery for her would be very dangerous due to the age and an open surgery would likely be  necessary due to her prior surgeries. We discussed that her right hip pain would not improve with a surgery.  She alos has a high likelihood of SNF following surgery, and worsening mobility. Overall I think the risk of any surgery out weight the benefits. I think she probably is having pain from osteoporosis of the hip.    Her son and her are in agreement with surgery being risky.  I would avoid surgery if possible.   All questions were answered to the satisfaction of the patient and family. I spent over 45 minutes looking over the patient's records and images, and discuss with the family the pain, possible etiologies, and risk of surgery.    Virl Cagey 07/30/2019, 11:06 AM

## 2019-07-31 NOTE — Telephone Encounter (Signed)
Spoke with Wells Guiles w/[facility] [phone]) - she states patient's provider is Rosealee Albee, NP & she will figure out how to get visit note from 07/18/2019 send to [facility]  Sent message to contact, [facility] w/[facility] that [facility] would send the requested documentation

## 2019-08-01 DIAGNOSIS — M25551 Pain in right hip: Secondary | ICD-10-CM | POA: Insufficient documentation

## 2019-08-02 ENCOUNTER — Telehealth: Payer: Self-pay | Admitting: Family Medicine

## 2019-08-02 NOTE — Telephone Encounter (Signed)
Go ahead with verbal order as requested

## 2019-08-02 NOTE — Telephone Encounter (Signed)
Verbal order given to Stacey at [facility] 

## 2019-08-02 NOTE — Telephone Encounter (Signed)
Homehealth-Stacey called needing verbal order for physical therapy for one week one and 2 weeks four. Stacey-[phone]

## 2019-08-06 ENCOUNTER — Telehealth: Payer: Self-pay | Admitting: Family Medicine

## 2019-08-06 NOTE — Telephone Encounter (Signed)
In this situation we would need to refer her to orthopedics.  She could be referred to orthopedics locally for her hip pain.  Or if she would prefer to see orthopedics and even we could do the same.  Also with the patient would want to come to be seen for office visit here that would be fine as well.  But we do not do injections into the hip thank you.

## 2019-08-06 NOTE — Telephone Encounter (Signed)
[facility] calling needing a new diagnosis code. The code they received is for pain in hip and leg and that wont work for [facility].   They want to know if the pt has osteoarthritis or osteoporosis so they could use a diagnosis code like that to get this approved.

## 2019-08-06 NOTE — Telephone Encounter (Signed)
Pt does have osteoarthritis on her problem list so I called shannon and gave her that diagnosis code and she wanted to let dr scott know that Ziggy has been complaining of pain in right hip for over one year and pt wanted to know if dr scott could give her an injection in her hip.

## 2019-08-07 NOTE — Telephone Encounter (Signed)
Called and discussed with pt. Pt states she would like to discuss with dr scott first and she did not want to come in office. She wanted to do virtual visit.

## 2019-08-07 NOTE — Telephone Encounter (Signed)
Left message to return call 

## 2019-08-08 DIAGNOSIS — M25551 Pain in right hip: Secondary | ICD-10-CM | POA: Diagnosis not present

## 2019-08-08 DIAGNOSIS — M1711 Unilateral primary osteoarthritis, right knee: Secondary | ICD-10-CM | POA: Diagnosis not present

## 2019-08-08 DIAGNOSIS — I10 Essential (primary) hypertension: Secondary | ICD-10-CM | POA: Diagnosis not present

## 2019-08-08 DIAGNOSIS — I4891 Unspecified atrial fibrillation: Secondary | ICD-10-CM | POA: Diagnosis not present

## 2019-08-13 ENCOUNTER — Other Ambulatory Visit: Payer: Self-pay

## 2019-08-13 ENCOUNTER — Ambulatory Visit (INDEPENDENT_AMBULATORY_CARE_PROVIDER_SITE_OTHER): Payer: Medicare Other | Admitting: Family Medicine

## 2019-08-13 DIAGNOSIS — K439 Ventral hernia without obstruction or gangrene: Secondary | ICD-10-CM | POA: Diagnosis not present

## 2019-08-13 DIAGNOSIS — M25559 Pain in unspecified hip: Secondary | ICD-10-CM | POA: Diagnosis not present

## 2019-08-13 DIAGNOSIS — R252 Cramp and spasm: Secondary | ICD-10-CM | POA: Diagnosis not present

## 2019-08-13 MED ORDER — SERTRALINE HCL 50 MG PO TABS: 50.0000 mg | ORAL_TABLET | Freq: Every day | ORAL | 5 refills | Status: DC

## 2019-08-13 MED ORDER — DILTIAZEM HCL ER COATED BEADS 120 MG PO CP24: ORAL_CAPSULE | ORAL | 5 refills | Status: DC

## 2019-08-13 MED ORDER — LISINOPRIL 2.5 MG PO TABS: 2.5000 mg | ORAL_TABLET | Freq: Every day | ORAL | 5 refills | Status: DC

## 2019-08-13 MED ORDER — PANTOPRAZOLE SODIUM 40 MG PO TBEC: 40.0000 mg | DELAYED_RELEASE_TABLET | Freq: Every day | ORAL | 5 refills | Status: DC

## 2019-08-13 MED ORDER — LEVOTHYROXINE SODIUM 75 MCG PO TABS: 75.0000 ug | ORAL_TABLET | Freq: Every day | ORAL | 5 refills | Status: DC

## 2019-08-13 MED ORDER — HYDROCODONE-ACETAMINOPHEN 5-325 MG PO TABS: ORAL_TABLET | ORAL | 0 refills | Status: DC

## 2019-08-13 MED ORDER — ALPRAZOLAM 0.25 MG PO TABS: ORAL_TABLET | ORAL | 4 refills | Status: DC

## 2019-08-13 MED ORDER — METOPROLOL SUCCINATE ER 50 MG PO TB24: ORAL_TABLET | ORAL | 5 refills | Status: DC

## 2019-08-13 MED ORDER — POTASSIUM CHLORIDE ER 10 MEQ PO TBCR: 10.0000 meq | EXTENDED_RELEASE_TABLET | Freq: Every day | ORAL | 5 refills | Status: DC

## 2019-08-13 MED ORDER — TORSEMIDE 20 MG PO TABS: ORAL_TABLET | ORAL | 5 refills | Status: DC

## 2019-08-13 NOTE — Progress Notes (Signed)
   Subjective:    Patient ID: [name], female    DOB: [birth year], 86 y.o.   MRN: [id number]  Hip Pain  Incident onset: over a year ago but worse the past 2 - 3 months. Pain location: right hip.  takes hydrocodone 5/325. Pt states she has increased to one whole tablet instead of a half tablet because one half was not helping.  Up til 3 am due to pain.  Uses a walker to get around some days having to take 2 and a day rather than 1 Pt having cramps in ankles and feet. Usually walking helps with the cramps.  Intermittent cramps and discomfort does try to do stretches for this Right shoulder pain, wrist pain, pain in fingers.  Intermittent shoulder pain wrist pain pains in her hands Long discussion held with the patient more than likely this is just related to getting older Virtual Visit via Telephone Note  I connected with [name] on 08/13/19 at 11:00 AM EDT by telephone and verified that I am speaking with the correct person using two identifiers.  Location: Patient: home Provider: office   I discussed the limitations, risks, security and privacy concerns of performing an evaluation and management service by telephone and the availability of in person appointments. I also discussed with the patient that there may be a patient responsible charge related to this service. The patient expressed understanding and agreed to proceed.   History of Present Illness:    Observations/Objective:   Assessment and Plan:   Follow Up Instructions:    I discussed the assessment and treatment plan with the patient. The patient was provided an opportunity to ask questions and all were answered. The patient agreed with the plan and demonstrated an understanding of the instructions.   The patient was advised to call back or seek an in-person evaluation if the symptoms worsen or if the condition fails to improve as anticipated.  I provided 20 minutes of non-face-to-face time during this  encounter.      Review of Systems     Objective:   Physical Exam  Virtual unable to do exam      Assessment & Plan:  1. Cramp and spasm Recently had electrolytes which look good therefore we will not repeat lab work currently stretching exercises recommended  2. Hip pain Hydrocodone may be use avoid excessive use no more than 1 or 2/day patient denies drowsiness with it  3. Ventral hernia without obstruction or gangrene This was noted on the CT scan but by no means needs any type of surgery  We will work on doing a home visit in May

## 2019-09-17 ENCOUNTER — Telehealth: Payer: Self-pay | Admitting: Family Medicine

## 2019-09-17 NOTE — Telephone Encounter (Signed)
[name] with [facility] calling to see if Dr. Nicki Reaper could call in something for pt. She is complaining of bilateral knee pain, back of knees. She does not like taking the hydrocodone it is too strong and tylenol is not strong enough.   Please call pt with what she should do.

## 2019-09-17 NOTE — Telephone Encounter (Signed)
Please advise. Thank you

## 2019-09-18 NOTE — Telephone Encounter (Signed)
Difficult situation for several reasons #1 patient is on blood thinner therefore cannot use anti-inflammatories #2 tramadol which is weaker than the hydrocodone yet stronger than Tylenol interacts with her other medications so therefore we cannot use tramadol #3 patient may safely take 500 mg Tylenol, to take it every 6 hours as needed for pain no greater than 6 in a day #4 hydrocodone if it is causing drowsiness or unsteadiness would not be advisable due to risk of falling- If the patient decides to utilize a half a tablet and does not have those side effects she could use that sparingly but not frequently  Feel free to notify if any other additional issues

## 2019-09-18 NOTE — Telephone Encounter (Signed)
No number to call [name] from [facility] and tried to call pt a few times and number is busy. Will try again tomorrow

## 2019-09-19 NOTE — Telephone Encounter (Signed)
Discussed with pt. Pt states she never said hydrocodone was too strong. She has been taking one half tablet but about 20 mins before I called she took a whole tablet even though it is prescribed for only one half and she states the pain has eased up and she does not feel any side effects. I did tell her she should only take it as prescribed.

## 2019-10-11 ENCOUNTER — Other Ambulatory Visit: Payer: Self-pay | Admitting: Family Medicine

## 2019-10-11 NOTE — Telephone Encounter (Signed)
Seen 08/13/19 for hip pain and prescribed hydrocodone at that visit

## 2019-10-15 ENCOUNTER — Telehealth: Payer: Self-pay | Admitting: Family Medicine

## 2019-10-15 NOTE — Telephone Encounter (Signed)
Pt notified and moved to the 4:10

## 2019-10-15 NOTE — Telephone Encounter (Signed)
Please inform family of will be in the [place] between 51 and 530, hopefully closer to 430-5 PM  Front-please move patient's appointment time to 4:10 PM thank you

## 2019-10-15 NOTE — Telephone Encounter (Signed)
Pt is schedule for home visit tomorrow 5/26 and would like to know what time Dr. Nicki Reaper will be coming.

## 2019-10-16 ENCOUNTER — Ambulatory Visit: Payer: Medicare Other | Admitting: Family Medicine

## 2019-10-16 ENCOUNTER — Other Ambulatory Visit: Payer: Self-pay

## 2019-10-16 DIAGNOSIS — R109 Unspecified abdominal pain: Secondary | ICD-10-CM | POA: Diagnosis not present

## 2019-10-16 DIAGNOSIS — R296 Repeated falls: Secondary | ICD-10-CM

## 2019-10-16 DIAGNOSIS — G8929 Other chronic pain: Secondary | ICD-10-CM | POA: Diagnosis not present

## 2019-10-16 NOTE — Progress Notes (Signed)
   Subjective:  Home visit  Patient ID: [name], female    DOB: [birth year], 86 y.o.   MRN: [id number]  HPI Pt needing follow up. Pt grand daughter Jeannetta Nap states pt is doing fine. No issues or concerns at this time. Taking all meds as prescribed.  This patient is having a difficult time with abdominal pain and low back pain Also has pain in her left knee Patient denies vomiting diarrhea denies sweats chills Denies dysuria She relates a lot of weakness in her legs Has very difficult time walking with a walker She feels she needs a motorized scooter to get around the house to allow her to do her ADLs She also relates that she did take a fall a few days ago she was able to get herself up with no injury She denies striking her head Her appetite is doing okay Her moods are doing okay She is on multiple medicines She has history of renal insufficiency as well as chronic abdominal pain and left knee arthritis and weakness She does use hydrocodone sparingly for pain and denies it causing drowsiness She cannot take anti-inflammatories She uses Xanax very sparingly Family would like to get an assistant to come in to help her on a regular basis She has a community member who helps her a little bit but it is not enough help  Review of Systems Please see above abdominal pain back pain knee pain fatigue with activity    Objective:   Physical Exam  Lungs clear respiratory rate normal heart ir regular no murmurs extremities no edema Rate controlled Blood pressure 168/74    Assessment & Plan:  Hypertension related into significant stiffness of the blood vessels related to age  We will make adjustments to her medications  Significant abdominal discomfort I do not recommend additional imaging currently  We will try to get home health to see the patient  We will also try to have home physical therapy because of her falls and weakness  Also will look into see if there is any  availability of aides who could sit with her Fall Risk  08/13/2019 07/30/2019 03/25/2019 02/25/2019 07/26/2017  Falls in the past year? 0 0 0 0 No  Number falls in past yr: - - - - -  Injury with Fall? - - - - -  Risk for fall due to : Impaired mobility - Impaired balance/gait;Impaired mobility History of fall(s);Impaired mobility -  Risk for fall due to: Comment falls in the past over one year ago. none since using walker - - - -  Follow up - - Falls evaluation completed Falls evaluation completed -    Finally we will look to see if she might qualify for a scooter

## 2019-10-18 ENCOUNTER — Other Ambulatory Visit: Payer: Self-pay | Admitting: *Deleted

## 2019-10-18 ENCOUNTER — Telehealth: Payer: Self-pay | Admitting: *Deleted

## 2019-10-18 DIAGNOSIS — R109 Unspecified abdominal pain: Secondary | ICD-10-CM

## 2019-10-18 DIAGNOSIS — K469 Unspecified abdominal hernia without obstruction or gangrene: Secondary | ICD-10-CM

## 2019-10-18 DIAGNOSIS — R296 Repeated falls: Secondary | ICD-10-CM

## 2019-10-18 DIAGNOSIS — R531 Weakness: Secondary | ICD-10-CM

## 2019-10-18 NOTE — Progress Notes (Signed)
Called [facility] and they were unable to do home health. Called [facility] and they said to fax over referral to fax number [phone]. Order, demographics, notes and insurance information faxed over.

## 2019-10-18 NOTE — Progress Notes (Signed)
Referral put in and left message to return call with granddaughter miranda

## 2019-10-18 NOTE — Progress Notes (Signed)
Left message with granddaughter Jeannetta Nap

## 2019-10-18 NOTE — Telephone Encounter (Signed)
Copied from cc chart message. [name] dose want to do second opinion.   [name], MD  [facility]  Finally-patient also with intermittent severe abdominal pain had CT scan did not show tumor I do not recommend any further scans if family is interested we can get a second opinion with a different general surgeon with [facility] in [place]. This would purely be for a second opinion I doubt that they would recommend surgery unless her condition worsen. There are options regarding second opinion

## 2019-10-18 NOTE — Progress Notes (Signed)
Daughter has been notified and referral put in. Need to call home health to set up since brendale is not in the office today or next week.

## 2019-10-18 NOTE — Telephone Encounter (Signed)
I recommend referral to general surgery [facility] Due to hernia and abdominal pain

## 2019-10-18 NOTE — Progress Notes (Signed)
[name] states she will call insurance and find out if she qualifies for one and which medical equipment provider to send it to.

## 2019-10-18 NOTE — Progress Notes (Signed)
Left message to return call 

## 2019-10-22 NOTE — Telephone Encounter (Signed)
Is there someone in [place] patient could see? She states she can't go to [facility]

## 2019-10-22 NOTE — Telephone Encounter (Signed)
So in this particular patient she saw Dr. Constance Haw who is the local general surgeon  (Dr. Constance Haw is in with Dr. Arnoldo Morale)  There are no other choices in [place] If they are interested in a second opinion I recommend [place] [facility] I doubt currently they would recommend surgery given her age and health findings But if they are interested in a second opinion [place] [facility]

## 2019-10-22 NOTE — Telephone Encounter (Signed)
Left message to return call 

## 2019-10-22 NOTE — Telephone Encounter (Signed)
Patient notified and stated she will proceed with referral to [facility] and will have someone drive her. Referral ordered in Epic.

## 2019-10-24 ENCOUNTER — Telehealth: Payer: Self-pay | Admitting: Family Medicine

## 2019-10-24 DIAGNOSIS — G8929 Other chronic pain: Secondary | ICD-10-CM | POA: Diagnosis not present

## 2019-10-24 DIAGNOSIS — M170 Bilateral primary osteoarthritis of knee: Secondary | ICD-10-CM | POA: Diagnosis not present

## 2019-10-24 DIAGNOSIS — Z79891 Long term (current) use of opiate analgesic: Secondary | ICD-10-CM | POA: Diagnosis not present

## 2019-10-24 DIAGNOSIS — I10 Essential (primary) hypertension: Secondary | ICD-10-CM | POA: Diagnosis not present

## 2019-10-24 DIAGNOSIS — R109 Unspecified abdominal pain: Secondary | ICD-10-CM | POA: Diagnosis not present

## 2019-10-24 DIAGNOSIS — M6281 Muscle weakness (generalized): Secondary | ICD-10-CM | POA: Diagnosis not present

## 2019-10-24 DIAGNOSIS — M545 Low back pain: Secondary | ICD-10-CM | POA: Diagnosis not present

## 2019-10-24 DIAGNOSIS — I4891 Unspecified atrial fibrillation: Secondary | ICD-10-CM | POA: Diagnosis not present

## 2019-10-24 DIAGNOSIS — Z7982 Long term (current) use of aspirin: Secondary | ICD-10-CM | POA: Diagnosis not present

## 2019-10-24 DIAGNOSIS — Z9181 History of falling: Secondary | ICD-10-CM | POA: Diagnosis not present

## 2019-10-24 NOTE — Telephone Encounter (Signed)
May have verbal orders for this 

## 2019-10-24 NOTE — Telephone Encounter (Signed)
Shannon notified.  °

## 2019-10-24 NOTE — Telephone Encounter (Signed)
[name] with [facility] calling requesting verbal orders for nursing 1 x 5 weeks and a home health aid 1 x 3 weeks.    CB# [phone]

## 2019-10-28 ENCOUNTER — Telehealth: Payer: Self-pay | Admitting: Family Medicine

## 2019-10-28 NOTE — Telephone Encounter (Signed)
Please advise. Thank you

## 2019-10-28 NOTE — Telephone Encounter (Signed)
May have please

## 2019-10-28 NOTE — Telephone Encounter (Signed)
[facility] contacted and verbalized understanding.

## 2019-10-28 NOTE — Telephone Encounter (Signed)
Ben-from [facility] needing verbal orders for physical therapy for one week one, two weeks for 4. [phone]

## 2019-10-29 ENCOUNTER — Other Ambulatory Visit: Payer: Self-pay | Admitting: Family Medicine

## 2019-10-29 NOTE — Telephone Encounter (Signed)
Pt called and needs something for pain her hip she said is unbearable and needs something called in.

## 2019-10-29 NOTE — Telephone Encounter (Signed)
Last seen in March for hip pain. Please advise. Thank you

## 2019-10-29 NOTE — Telephone Encounter (Signed)
In the past we have used hydrocodone sparingly to help her when needed.  It is possible she may be out of these? If she is out of these we can send in a refill May pend the refill after talking with patient please

## 2019-10-30 MED ORDER — HYDROCODONE-ACETAMINOPHEN 5-325 MG PO TABS: ORAL_TABLET | ORAL | 0 refills | Status: DC

## 2019-10-30 NOTE — Telephone Encounter (Signed)
Pt states that she has not received medication at this time. Informed patient that it was sent in. Advised pt to call pharmacy. Pt verbalized understanding.

## 2019-11-01 ENCOUNTER — Telehealth: Payer: Self-pay | Admitting: Family Medicine

## 2019-11-01 NOTE — Telephone Encounter (Signed)
Please advise. Thank you

## 2019-11-01 NOTE — Telephone Encounter (Signed)
1.  Please try to clarify [name]'s message I'm not sure if I completely understand what the issue is and what they are hoping for  #2 please let them know that I am out of the office the rest of today I will be back on Monday but if there is something emergent please bring it to [name] attention otherwise I will address this on Monday

## 2019-11-01 NOTE — Telephone Encounter (Signed)
Spoke with Sondra Barges; Tami states that when she was speaking with Home health nurse, pt was in background saying that if Dr.Scott didn't address her ear problem, she was going to stop home health.  Contacted pt. Pt fell off stool last night while taking her blouse off. Home health nurse told her she needed a new stool. Pt states she has had that stool for 61 years and it was her fault that she feel. Pt states she is unable to hear out of left ear. Pt states she told provider about it when he did the home visit. Pt states provider must have forgot. Ears feel the same way they did when this happened a few years back. Pt had wax build up a few years back and she came here to have ears cleaned. Pt is aware that provider is out of office until Monday. Please advise. Thank you.

## 2019-11-01 NOTE — Telephone Encounter (Signed)
[name] from [facility] called and reported that [name] fell off stool and said that she is in more paid with her back and hip pain. Pt also stated that if she did not hear from Dr Nicki Reaper on her ear pain that address this pain, she will cancel all home health, PT and nursing.

## 2019-11-01 NOTE — Telephone Encounter (Signed)
Pt contacted. Pt states that she has no way to [name], so "just forget it". Spoke with patient about different options like OTC products. Pt states she has been using OTC drops but that has not been helping. Informed pt that she could try Urgent Care to have her ear irrigated. Informed pt of Urgent Care location, she states that is right up the road from her. Pt states she will try to go to Urgent Care.

## 2019-11-01 NOTE — Telephone Encounter (Signed)
Refer to Dr Benjamine Mola or [facility] for cerumen impaction

## 2019-11-04 ENCOUNTER — Encounter: Payer: Self-pay | Admitting: Family Medicine

## 2019-11-04 DIAGNOSIS — G8929 Other chronic pain: Secondary | ICD-10-CM | POA: Diagnosis not present

## 2019-11-04 DIAGNOSIS — M6281 Muscle weakness (generalized): Secondary | ICD-10-CM | POA: Diagnosis not present

## 2019-11-04 DIAGNOSIS — M170 Bilateral primary osteoarthritis of knee: Secondary | ICD-10-CM | POA: Diagnosis not present

## 2019-11-06 ENCOUNTER — Inpatient Hospital Stay (HOSPITAL_COMMUNITY)
Admission: EM | Admit: 2019-11-06 | Discharge: 2019-11-09 | DRG: 389 | Disposition: A | Discharge status: Home Health | Payer: Medicare Other | Attending: Family Medicine | Admitting: Family Medicine

## 2019-11-06 ENCOUNTER — Emergency Department (HOSPITAL_COMMUNITY): Payer: Medicare Other

## 2019-11-06 ENCOUNTER — Other Ambulatory Visit: Payer: Self-pay

## 2019-11-06 ENCOUNTER — Encounter (HOSPITAL_COMMUNITY): Payer: Self-pay | Admitting: Emergency Medicine

## 2019-11-06 DIAGNOSIS — Z20822 Contact with and (suspected) exposure to covid-19: Secondary | ICD-10-CM | POA: Diagnosis not present

## 2019-11-06 DIAGNOSIS — Z881 Allergy status to other antibiotic agents status: Secondary | ICD-10-CM

## 2019-11-06 DIAGNOSIS — Z83438 Family history of other disorder of lipoprotein metabolism and other lipidemia: Secondary | ICD-10-CM

## 2019-11-06 DIAGNOSIS — E785 Hyperlipidemia, unspecified: Secondary | ICD-10-CM | POA: Diagnosis present

## 2019-11-06 DIAGNOSIS — B9620 Unspecified Escherichia coli [E. coli] as the cause of diseases classified elsewhere: Secondary | ICD-10-CM | POA: Diagnosis not present

## 2019-11-06 DIAGNOSIS — N179 Acute kidney failure, unspecified: Secondary | ICD-10-CM | POA: Diagnosis not present

## 2019-11-06 DIAGNOSIS — Z882 Allergy status to sulfonamides status: Secondary | ICD-10-CM

## 2019-11-06 DIAGNOSIS — K56609 Unspecified intestinal obstruction, unspecified as to partial versus complete obstruction: Secondary | ICD-10-CM | POA: Diagnosis present

## 2019-11-06 DIAGNOSIS — Z79890 Hormone replacement therapy (postmenopausal): Secondary | ICD-10-CM

## 2019-11-06 DIAGNOSIS — G8929 Other chronic pain: Secondary | ICD-10-CM | POA: Diagnosis present

## 2019-11-06 DIAGNOSIS — H6122 Impacted cerumen, left ear: Secondary | ICD-10-CM | POA: Diagnosis present

## 2019-11-06 DIAGNOSIS — E860 Dehydration: Secondary | ICD-10-CM | POA: Diagnosis not present

## 2019-11-06 DIAGNOSIS — I482 Chronic atrial fibrillation, unspecified: Secondary | ICD-10-CM | POA: Diagnosis present

## 2019-11-06 DIAGNOSIS — K5651 Intestinal adhesions [bands], with partial obstruction: Principal | ICD-10-CM | POA: Diagnosis present

## 2019-11-06 DIAGNOSIS — F419 Anxiety disorder, unspecified: Secondary | ICD-10-CM | POA: Diagnosis present

## 2019-11-06 DIAGNOSIS — Z880 Allergy status to penicillin: Secondary | ICD-10-CM

## 2019-11-06 DIAGNOSIS — J9811 Atelectasis: Secondary | ICD-10-CM | POA: Diagnosis not present

## 2019-11-06 DIAGNOSIS — Z888 Allergy status to other drugs, medicaments and biological substances status: Secondary | ICD-10-CM

## 2019-11-06 DIAGNOSIS — Z9049 Acquired absence of other specified parts of digestive tract: Secondary | ICD-10-CM | POA: Diagnosis not present

## 2019-11-06 DIAGNOSIS — Z79899 Other long term (current) drug therapy: Secondary | ICD-10-CM | POA: Diagnosis not present

## 2019-11-06 DIAGNOSIS — K219 Gastro-esophageal reflux disease without esophagitis: Secondary | ICD-10-CM | POA: Diagnosis present

## 2019-11-06 DIAGNOSIS — Z03818 Encounter for observation for suspected exposure to other biological agents ruled out: Secondary | ICD-10-CM | POA: Diagnosis not present

## 2019-11-06 DIAGNOSIS — K439 Ventral hernia without obstruction or gangrene: Secondary | ICD-10-CM | POA: Diagnosis not present

## 2019-11-06 DIAGNOSIS — N3000 Acute cystitis without hematuria: Secondary | ICD-10-CM

## 2019-11-06 DIAGNOSIS — I4891 Unspecified atrial fibrillation: Secondary | ICD-10-CM | POA: Diagnosis present

## 2019-11-06 DIAGNOSIS — N1832 Chronic kidney disease, stage 3b: Secondary | ICD-10-CM | POA: Diagnosis present

## 2019-11-06 DIAGNOSIS — R739 Hyperglycemia, unspecified: Secondary | ICD-10-CM | POA: Diagnosis not present

## 2019-11-06 DIAGNOSIS — Z885 Allergy status to narcotic agent status: Secondary | ICD-10-CM

## 2019-11-06 DIAGNOSIS — F329 Major depressive disorder, single episode, unspecified: Secondary | ICD-10-CM | POA: Diagnosis present

## 2019-11-06 DIAGNOSIS — M810 Age-related osteoporosis without current pathological fracture: Secondary | ICD-10-CM | POA: Diagnosis present

## 2019-11-06 DIAGNOSIS — E039 Hypothyroidism, unspecified: Secondary | ICD-10-CM | POA: Diagnosis not present

## 2019-11-06 DIAGNOSIS — Z853 Personal history of malignant neoplasm of breast: Secondary | ICD-10-CM

## 2019-11-06 DIAGNOSIS — I129 Hypertensive chronic kidney disease with stage 1 through stage 4 chronic kidney disease, or unspecified chronic kidney disease: Secondary | ICD-10-CM | POA: Diagnosis present

## 2019-11-06 DIAGNOSIS — R112 Nausea with vomiting, unspecified: Secondary | ICD-10-CM | POA: Diagnosis present

## 2019-11-06 LAB — CBC WITH DIFFERENTIAL/PLATELET
Abs Immature Granulocytes: 0.08 10*3/uL — ABNORMAL HIGH (ref 0.00–0.07)
Basophils Absolute: 0 10*3/uL (ref 0.0–0.1)
Basophils Relative: 0 %
Eosinophils Absolute: 0.1 10*3/uL (ref 0.0–0.5)
Eosinophils Relative: 0 %
HCT: 41.8 % (ref 36.0–46.0)
Hemoglobin: 13.7 g/dL (ref 12.0–15.0)
Immature Granulocytes: 1 %
Lymphocytes Relative: 10 %
Lymphs Abs: 1.8 10*3/uL (ref 0.7–4.0)
MCH: 29.4 pg (ref 26.0–34.0)
MCHC: 32.8 g/dL (ref 30.0–36.0)
MCV: 89.7 fL (ref 80.0–100.0)
Monocytes Absolute: 0.6 10*3/uL (ref 0.1–1.0)
Monocytes Relative: 4 %
Neutro Abs: 15 10*3/uL — ABNORMAL HIGH (ref 1.7–7.7)
Neutrophils Relative %: 85 %
Platelets: 350 10*3/uL (ref 150–400)
RBC: 4.66 MIL/uL (ref 3.87–5.11)
RDW: 12.2 % (ref 11.5–15.5)
WBC: 17.6 10*3/uL — ABNORMAL HIGH (ref 4.0–10.5)
nRBC: 0 % (ref 0.0–0.2)

## 2019-11-06 LAB — URINALYSIS, ROUTINE W REFLEX MICROSCOPIC
Bilirubin Urine: NEGATIVE
Glucose, UA: NEGATIVE mg/dL
Hgb urine dipstick: NEGATIVE
Ketones, ur: NEGATIVE mg/dL
Nitrite: POSITIVE — AB
Protein, ur: NEGATIVE mg/dL
Specific Gravity, Urine: 1.012 (ref 1.005–1.030)
WBC, UA: >50 WBC/hpf — ABNORMAL HIGH (ref 0–5)
pH: 6 (ref 5.0–8.0)

## 2019-11-06 LAB — COMPREHENSIVE METABOLIC PANEL
ALT: 17 U/L (ref 0–44)
AST: 25 U/L (ref 15–41)
Albumin: 4.5 g/dL (ref 3.5–5.0)
Alkaline Phosphatase: 64 U/L (ref 38–126)
Anion gap: 17 — ABNORMAL HIGH (ref 5–15)
BUN: 38 mg/dL — ABNORMAL HIGH (ref 8–23)
CO2: 23 mmol/L (ref 22–32)
Calcium: 9.7 mg/dL (ref 8.9–10.3)
Chloride: 98 mmol/L (ref 98–111)
Creatinine, Ser: 1.84 mg/dL — ABNORMAL HIGH (ref 0.44–1.00)
GFR calc Af Amer: 27 mL/min — ABNORMAL LOW (ref >60)
GFR calc non Af Amer: 23 mL/min — ABNORMAL LOW (ref >60)
Glucose, Bld: 199 mg/dL — ABNORMAL HIGH (ref 70–99)
Potassium: 4.1 mmol/L (ref 3.5–5.1)
Sodium: 138 mmol/L (ref 135–145)
Total Bilirubin: 0.8 mg/dL (ref 0.3–1.2)
Total Protein: 8.4 g/dL — ABNORMAL HIGH (ref 6.5–8.1)

## 2019-11-06 LAB — SARS CORONAVIRUS 2 BY RT PCR (HOSPITAL ORDER, PERFORMED IN ~~LOC~~ HOSPITAL LAB): SARS Coronavirus 2: NEGATIVE

## 2019-11-06 LAB — LIPASE, BLOOD: Lipase: 31 U/L (ref 11–51)

## 2019-11-06 MED ORDER — ONDANSETRON HCL 4 MG/2ML IJ SOLN
4.0000 mg | Freq: Once | INTRAMUSCULAR | Status: AC
  Administered 2019-11-06: 4 mg via INTRAVENOUS
  Filled 2019-11-06: qty 2

## 2019-11-06 MED ORDER — FENTANYL CITRATE (PF) 100 MCG/2ML IJ SOLN
50.0000 ug | Freq: Once | INTRAMUSCULAR | Status: AC
  Administered 2019-11-06: 50 ug via INTRAVENOUS
  Filled 2019-11-06: qty 2

## 2019-11-06 MED ORDER — SODIUM CHLORIDE 0.9 % IV BOLUS
1000.0000 mL | Freq: Once | INTRAVENOUS | Status: AC
  Administered 2019-11-06: 1000 mL via INTRAVENOUS

## 2019-11-06 NOTE — ED Provider Notes (Signed)
[facility] EMERGENCY DEPARTMENT Provider Note   CSN: [id number] Arrival date & time: 11/06/19  1919     History Chief Complaint  Patient presents with   Emesis    [name] is a 86 y.o. female.  Pt presents to the ED today with abdominal pain and n/v.  The pt has chronic RLQ pain, but this pain is different.  The pt said it started today.  Pain more in the RUQ, but it is everywhere.  Pt said she gets frequent UTIs and recently finished abx for the UTI.  Pt also c/o left ear pain.        Past Medical History:  Diagnosis Date   Abdominal pain, chronic, right lower quadrant    "ever since I had my appendix removed"   Atrial fibrillation ([place])    Breast cancer ([place])    Cancer ([place])    left side   Chronic back pain    Edema    GERD (gastroesophageal reflux disease)    Hyperlipidemia    Hypertension    Osteopenia    Osteoporosis    Ovarian cyst 02/2015   left   Recurrent abdominal pain    LLQ    Patient Active Problem List   Diagnosis Date Noted   Right hip pain 08/01/2019   GIB (gastrointestinal bleeding) 02/17/2019   GI bleed 02/16/2019   HLD (hyperlipidemia) 07/18/2017   B12 deficiency 07/18/2017   SAH (subarachnoid hemorrhage) ([place]) 07/17/2017   Hypertensive urgency 07/16/2017   ICH (intracerebral hemorrhage) ([place]) 07/16/2017   Cervical disc disease 05/09/2017   Hypothyroid 05/09/2017   Major depression in partial remission ([place]) 03/17/2016   Atrial fibrillation ([place]) 01/12/2016   Osteoarthritis of right knee 12/15/2015   Major depression 09/22/2015   Ventral hernia 05/21/2015   Ovarian tumor 04/13/2015   Squamous cell skin cancer 08/13/2014   Impaired fasting glucose 08/05/2013   Neuropathy 01/11/2013   Long term (current) use of anticoagulants 07/27/2012   Atrial fibrillation with RVR ([place]) 01/29/2012   Anxiety 01/29/2012   GERD (gastroesophageal reflux disease) 01/29/2012   Chronic anticoagulation  01/29/2012   Sciatica of right side 05/10/2011    Past Surgical History:  Procedure Laterality Date   APPENDECTOMY     APPENDECTOMY     BREAST SURGERY Left    lumpectomy   CARDIOVERSION N/A 03/31/2016   Procedure: CARDIOVERSION;  Surgeon: Josue Hector, MD;  Location: [facility];  Service: Cardiovascular;  Laterality: N/A;   COLON RESECTION     COLON SURGERY     HERNIA REPAIR     HERNIA REPAIR       OB History    Gravida  2   Para  1   Term      Preterm      AB  1   Living  1     SAB  1   TAB      Ectopic      Multiple      Live Births              Family History  Problem Relation Age of Onset   Diabetes Mother    Heart disease Other    Arthritis Other    Cancer Other    Hyperlipidemia Brother     Social History   Tobacco Use   Smoking status: Never Smoker   Smokeless tobacco: Never Used  Vaping Use   Vaping Use: Never used  Substance Use Topics   Alcohol use:  No    Alcohol/week: 0.0 standard drinks   Drug use: No    Home Medications Prior to Admission medications   Medication Sig Start Date End Date Taking? Authorizing Provider  acetaminophen (TYLENOL) 325 MG tablet Take 2 tablets (650 mg total) by mouth every 6 (six) hours as needed for mild pain (or Fever >/= 101). 02/18/19   Roxan Hockey, MD  ALPRAZolam Duanne Moron) 0.25 MG tablet Take one tablet by mouth twice daily prn needed for anxiety or sleep 08/13/19   [name], MD  diltiazem (CARDIZEM CD) 120 MG 24 hr capsule Take one capsule by mouth once daily 08/13/19   [name], MD  HYDROcodone-acetaminophen (NORCO/VICODIN) 5-325 MG tablet TAKE 1/2-1 TABLET BY MOUTH EVERY TWELVE HOURS AS NEEDED. 10/30/19   [name], MD  levothyroxine (SYNTHROID) 75 MCG tablet Take 1 tablet (75 mcg total) by mouth daily. 08/13/19   [name], MD  lisinopril (ZESTRIL) 2.5 MG tablet Take 1 tablet (2.5 mg total) by mouth daily. 08/13/19   [name], MD  metoprolol  succinate (TOPROL-XL) 50 MG 24 hr tablet TAKE 1 TABLET BY MOUTH DAILY. TAKE WITH OR IMMEDIATELY FOLLOWING A MEAL. 08/13/19   [name], MD  Multiple Vitamin (MULTIVITAMIN) tablet Take 1 tablet by mouth daily.    [provider]  pantoprazole (PROTONIX) 40 MG tablet Take 1 tablet (40 mg total) by mouth daily. 08/13/19   [name], MD  potassium chloride (KLOR-CON) 10 MEQ tablet Take 1 tablet (10 mEq total) by mouth daily. 08/13/19   [name], MD  sertraline (ZOLOFT) 50 MG tablet Take 1 tablet (50 mg total) by mouth daily. 08/13/19   [name], MD  torsemide (DEMADEX) 20 MG tablet Take one tablet every other day 08/13/19   [name], MD  vitamin B-12 1000 MCG tablet Take 1 tablet (1,000 mcg total) by mouth daily. 07/21/17   Mary Sella, NP    Allergies    Amiodarone, Amoxicillin, Cephalosporins, Lipitor [atorvastatin], Lorazepam, Lovenox [enoxaparin sodium], Morphine and related, Penicillins, and Sulfa antibiotics  Review of Systems   Review of Systems  Gastrointestinal: Positive for abdominal pain, nausea and vomiting.  All other systems reviewed and are negative.   Physical Exam Updated Vital Signs BP (!) 157/58    Pulse 85    Temp 98.6 F (37 C) (Oral)    Resp 18    Ht 5\' 2"  (1.575 m)    Wt 63.5 kg    SpO2 96%    BMI 25.61 kg/m   Physical Exam Vitals and nursing note reviewed.  Constitutional:      Appearance: Normal appearance.  HENT:     Head: Normocephalic and atraumatic.     Right Ear: External ear normal.     Left Ear: External ear normal. There is impacted cerumen.     Nose: Nose normal.     Mouth/Throat:     Mouth: Mucous membranes are dry.  Eyes:     Extraocular Movements: Extraocular movements intact.     Conjunctiva/sclera: Conjunctivae normal.     Pupils: Pupils are equal, round, and reactive to light.  Cardiovascular:     Rate and Rhythm: Normal rate and regular rhythm.     Pulses: Normal pulses.     Heart sounds: Normal  heart sounds.  Pulmonary:     Effort: Pulmonary effort is normal.     Breath sounds: Normal breath sounds.  Abdominal:  General: Abdomen is flat.     Tenderness: There is generalized abdominal tenderness.  Musculoskeletal:        General: Normal range of motion.     Cervical back: Normal range of motion and neck supple.  Skin:    General: Skin is warm.     Capillary Refill: Capillary refill takes less than 2 seconds.  Neurological:     General: No focal deficit present.     Mental Status: She is alert and oriented to person, place, and time.  Psychiatric:        Mood and Affect: Mood normal.        Behavior: Behavior normal.        Thought Content: Thought content normal.        Judgment: Judgment normal.     ED Results / Procedures / Treatments   Labs (all labs ordered are listed, but only abnormal results are displayed) Labs Reviewed  CBC WITH DIFFERENTIAL/PLATELET - Abnormal; Notable for the following components:      Result Value   WBC 17.6 (*)    Neutro Abs 15.0 (*)    Abs Immature Granulocytes 0.08 (*)    All other components within normal limits  COMPREHENSIVE METABOLIC PANEL - Abnormal; Notable for the following components:   Glucose, Bld 199 (*)    BUN 38 (*)    Creatinine, Ser 1.84 (*)    Total Protein 8.4 (*)    GFR calc non Af Amer 23 (*)    GFR calc Af Amer 27 (*)    Anion gap 17 (*)    All other components within normal limits  URINALYSIS, ROUTINE W REFLEX MICROSCOPIC - Abnormal; Notable for the following components:   APPearance HAZY (*)    Nitrite POSITIVE (*)    Leukocytes,Ua LARGE (*)    WBC, UA >50 (*)    Bacteria, UA FEW (*)    All other components within normal limits  URINE CULTURE  SARS CORONAVIRUS 2 BY RT PCR (HOSPITAL ORDER, [facility])  LIPASE, BLOOD    EKG None  Radiology CT ABDOMEN PELVIS WO CONTRAST  Result Date: 11/06/2019 CLINICAL DATA:  Abdomen pain EXAM: CT ABDOMEN AND PELVIS WITHOUT CONTRAST  TECHNIQUE: Multidetector CT imaging of the abdomen and pelvis was performed following the standard protocol without IV contrast. COMPARISON:  CT 07/25/2019, 06/22/2018 FINDINGS: Lower chest: Lung bases demonstrate no acute consolidation or pleural effusion. Coronary vascular calcification. Hepatobiliary: No focal liver abnormality is seen. No gallstones, gallbladder wall thickening, or biliary dilatation. Pancreas: Unremarkable. No pancreatic ductal dilatation or surrounding inflammatory changes. Spleen: Normal in size without focal abnormality. Adrenals/Urinary Tract: Adrenal glands are normal. Mild atrophy right kidney. No hydronephrosis. Urinary bladder is unremarkable. Stomach/Bowel: The stomach is nonenlarged. Mildly dilated fluid-filled proximal and mid small bowel measuring up to 3.5 cm in diameter without bowel wall thickening. Transition to nondilated distal small bowel in the left lower quadrant/pelvis, consistent with bowel obstruction, likely due to adhesion. There is a small low anterior ventral hernia containing portion of dilated small bowel loops but this does not appear to be the source of obstruction. Vascular/Lymphatic: Extensive aortic atherosclerosis. No aneurysm. No suspicious adenopathy Reproductive: Uterus and bilateral adnexa are unremarkable. Other: No free air or free fluid. Musculoskeletal: Degenerative changes. No acute or suspicious osseous abnormality. IMPRESSION: 1. Findings consistent with small bowel obstruction, transition point visible in the left lower quadrant/pelvis, and felt likely due to adhesions. There is a small low ventral hernia  containing a portion of dilated small bowel but this does not appear to be source of obstruction. 2. Mildly atrophic right kidney.  Negative for hydronephrosis Electronically Signed   By: Donavan Foil M.D.   On: 11/06/2019 21:35    Procedures Procedures (including critical care time)  Medications Ordered in ED Medications  sodium  chloride 0.9 % bolus 1,000 mL (0 mLs Intravenous Stopped 11/06/19 2132)  ondansetron (ZOFRAN) injection 4 mg (4 mg Intravenous Given 11/06/19 2011)  fentaNYL (SUBLIMAZE) injection 50 mcg (50 mcg Intravenous Given 11/06/19 2011)  fentaNYL (SUBLIMAZE) injection 50 mcg (50 mcg Intravenous Given 11/06/19 2225)    ED Course  I have reviewed the triage vital signs and the nursing notes.  Pertinent labs & imaging results that were available during my care of the patient were reviewed by me and considered in my medical decision making (see chart for details).    MDM Rules/Calculators/A&P                           Ear will be irrigated.  Pt does have a SBO.  A NG to LIWS was placed.  Pt d/w Dr. Arnoldo Morale (surgery) who will consult.  He requests hospitalist admission.  Pt given rocephin IV for her UTI.  Urine sent for cx.  Pt d/w Dr. Humphrey Rolls (triad) for admission.  Final Clinical Impression(s) / ED Diagnoses Final diagnoses:  SBO (small bowel obstruction) ([place])  Acute cystitis without hematuria  Impacted cerumen of left ear    Rx / DC Orders ED Discharge Orders    None       Isla Pence, MD 11/06/19 2304

## 2019-11-06 NOTE — ED Triage Notes (Signed)
Pt c/o emesis and abd pain that started today, but pt states she has had right flank pain x one year.

## 2019-11-07 ENCOUNTER — Inpatient Hospital Stay (HOSPITAL_COMMUNITY): Payer: Medicare Other

## 2019-11-07 DIAGNOSIS — Z881 Allergy status to other antibiotic agents status: Secondary | ICD-10-CM | POA: Diagnosis not present

## 2019-11-07 DIAGNOSIS — K56609 Unspecified intestinal obstruction, unspecified as to partial versus complete obstruction: Secondary | ICD-10-CM | POA: Diagnosis present

## 2019-11-07 DIAGNOSIS — R112 Nausea with vomiting, unspecified: Secondary | ICD-10-CM | POA: Diagnosis present

## 2019-11-07 DIAGNOSIS — F419 Anxiety disorder, unspecified: Secondary | ICD-10-CM | POA: Diagnosis present

## 2019-11-07 DIAGNOSIS — I129 Hypertensive chronic kidney disease with stage 1 through stage 4 chronic kidney disease, or unspecified chronic kidney disease: Secondary | ICD-10-CM | POA: Diagnosis present

## 2019-11-07 DIAGNOSIS — Z9049 Acquired absence of other specified parts of digestive tract: Secondary | ICD-10-CM | POA: Diagnosis not present

## 2019-11-07 DIAGNOSIS — N1832 Chronic kidney disease, stage 3b: Secondary | ICD-10-CM | POA: Diagnosis present

## 2019-11-07 DIAGNOSIS — Z83438 Family history of other disorder of lipoprotein metabolism and other lipidemia: Secondary | ICD-10-CM | POA: Diagnosis not present

## 2019-11-07 DIAGNOSIS — H6122 Impacted cerumen, left ear: Secondary | ICD-10-CM | POA: Diagnosis present

## 2019-11-07 DIAGNOSIS — E039 Hypothyroidism, unspecified: Secondary | ICD-10-CM | POA: Diagnosis present

## 2019-11-07 DIAGNOSIS — K219 Gastro-esophageal reflux disease without esophagitis: Secondary | ICD-10-CM | POA: Diagnosis present

## 2019-11-07 DIAGNOSIS — B9620 Unspecified Escherichia coli [E. coli] as the cause of diseases classified elsewhere: Secondary | ICD-10-CM | POA: Diagnosis present

## 2019-11-07 DIAGNOSIS — Z79899 Other long term (current) drug therapy: Secondary | ICD-10-CM | POA: Diagnosis not present

## 2019-11-07 DIAGNOSIS — R739 Hyperglycemia, unspecified: Secondary | ICD-10-CM | POA: Diagnosis present

## 2019-11-07 DIAGNOSIS — G8929 Other chronic pain: Secondary | ICD-10-CM | POA: Diagnosis present

## 2019-11-07 DIAGNOSIS — K5651 Intestinal adhesions [bands], with partial obstruction: Secondary | ICD-10-CM | POA: Diagnosis present

## 2019-11-07 DIAGNOSIS — Z853 Personal history of malignant neoplasm of breast: Secondary | ICD-10-CM | POA: Diagnosis not present

## 2019-11-07 DIAGNOSIS — M810 Age-related osteoporosis without current pathological fracture: Secondary | ICD-10-CM | POA: Diagnosis present

## 2019-11-07 DIAGNOSIS — I482 Chronic atrial fibrillation, unspecified: Secondary | ICD-10-CM | POA: Diagnosis present

## 2019-11-07 DIAGNOSIS — Z20822 Contact with and (suspected) exposure to covid-19: Secondary | ICD-10-CM | POA: Diagnosis present

## 2019-11-07 DIAGNOSIS — E860 Dehydration: Secondary | ICD-10-CM | POA: Diagnosis present

## 2019-11-07 DIAGNOSIS — F329 Major depressive disorder, single episode, unspecified: Secondary | ICD-10-CM | POA: Diagnosis present

## 2019-11-07 DIAGNOSIS — N179 Acute kidney failure, unspecified: Secondary | ICD-10-CM | POA: Diagnosis present

## 2019-11-07 DIAGNOSIS — Z79890 Hormone replacement therapy (postmenopausal): Secondary | ICD-10-CM | POA: Diagnosis not present

## 2019-11-07 DIAGNOSIS — N3000 Acute cystitis without hematuria: Secondary | ICD-10-CM | POA: Diagnosis present

## 2019-11-07 DIAGNOSIS — E785 Hyperlipidemia, unspecified: Secondary | ICD-10-CM | POA: Diagnosis present

## 2019-11-07 LAB — GLUCOSE, CAPILLARY
Glucose-Capillary: 105 mg/dL — ABNORMAL HIGH (ref 70–99)
Glucose-Capillary: 108 mg/dL — ABNORMAL HIGH (ref 70–99)
Glucose-Capillary: 89 mg/dL (ref 70–99)
Glucose-Capillary: 104 mg/dL — ABNORMAL HIGH (ref 70–99)

## 2019-11-07 LAB — COMPREHENSIVE METABOLIC PANEL
ALT: 12 U/L (ref 0–44)
AST: 17 U/L (ref 15–41)
Albumin: 3.3 g/dL — ABNORMAL LOW (ref 3.5–5.0)
Alkaline Phosphatase: 49 U/L (ref 38–126)
Anion gap: 11 (ref 5–15)
BUN: 30 mg/dL — ABNORMAL HIGH (ref 8–23)
CO2: 24 mmol/L (ref 22–32)
Calcium: 8 mg/dL — ABNORMAL LOW (ref 8.9–10.3)
Chloride: 109 mmol/L (ref 98–111)
Creatinine, Ser: 1.46 mg/dL — ABNORMAL HIGH (ref 0.44–1.00)
GFR calc Af Amer: 36 mL/min — ABNORMAL LOW (ref >60)
GFR calc non Af Amer: 31 mL/min — ABNORMAL LOW (ref >60)
Glucose, Bld: 107 mg/dL — ABNORMAL HIGH (ref 70–99)
Potassium: 3.8 mmol/L (ref 3.5–5.1)
Sodium: 144 mmol/L (ref 135–145)
Total Bilirubin: 0.6 mg/dL (ref 0.3–1.2)
Total Protein: 6 g/dL — ABNORMAL LOW (ref 6.5–8.1)

## 2019-11-07 LAB — CBC
HCT: 33.6 % — ABNORMAL LOW (ref 36.0–46.0)
Hemoglobin: 10.6 g/dL — ABNORMAL LOW (ref 12.0–15.0)
MCH: 29.4 pg (ref 26.0–34.0)
MCHC: 31.5 g/dL (ref 30.0–36.0)
MCV: 93.3 fL (ref 80.0–100.0)
Platelets: 239 10*3/uL (ref 150–400)
RBC: 3.6 MIL/uL — ABNORMAL LOW (ref 3.87–5.11)
RDW: 12.3 % (ref 11.5–15.5)
WBC: 10.5 10*3/uL (ref 4.0–10.5)
nRBC: 0 % (ref 0.0–0.2)

## 2019-11-07 MED ORDER — INSULIN ASPART 100 UNIT/ML ~~LOC~~ SOLN: 0.0000 [IU] | Freq: Every day | SUBCUTANEOUS | Status: DC

## 2019-11-07 MED ORDER — FLUTICASONE PROPIONATE 50 MCG/ACT NA SUSP
1.0000 | Freq: Every day | NASAL | Status: DC
  Administered 2019-11-07 – 2019-11-08 (×2): 1 via NASAL
  Filled 2019-11-07: qty 16

## 2019-11-07 MED ORDER — ALPRAZOLAM 0.25 MG PO TABS
0.2500 mg | ORAL_TABLET | Freq: Once | ORAL | Status: AC
  Administered 2019-11-07: 0.25 mg via ORAL
  Filled 2019-11-07: qty 1

## 2019-11-07 MED ORDER — KCL IN DEXTROSE-NACL 20-5-0.45 MEQ/L-%-% IV SOLN: INTRAVENOUS | Status: DC

## 2019-11-07 MED ORDER — FENTANYL CITRATE (PF) 100 MCG/2ML IJ SOLN
12.5000 ug | INTRAMUSCULAR | Status: DC | PRN
  Administered 2019-11-07: 12.5 ug via INTRAVENOUS
  Filled 2019-11-07: qty 2

## 2019-11-07 MED ORDER — ACETAMINOPHEN 325 MG PO TABS
650.0000 mg | ORAL_TABLET | Freq: Four times a day (QID) | ORAL | Status: DC | PRN
  Administered 2019-11-07 – 2019-11-09 (×3): 650 mg via ORAL
  Filled 2019-11-07 (×3): qty 2

## 2019-11-07 MED ORDER — BISACODYL 10 MG RE SUPP
10.0000 mg | Freq: Two times a day (BID) | RECTAL | Status: DC
  Filled 2019-11-07: qty 1

## 2019-11-07 MED ORDER — INSULIN ASPART 100 UNIT/ML ~~LOC~~ SOLN: 0.0000 [IU] | Freq: Three times a day (TID) | SUBCUTANEOUS | Status: DC

## 2019-11-07 MED ORDER — SODIUM CHLORIDE 0.9 % IV SOLN
1.0000 g | INTRAVENOUS | Status: DC
  Administered 2019-11-07 – 2019-11-08 (×2): 1 g via INTRAVENOUS
  Filled 2019-11-07 (×3): qty 10

## 2019-11-07 MED ORDER — SODIUM CHLORIDE 0.9 % IV SOLN: INTRAVENOUS | Status: DC

## 2019-11-07 MED ORDER — ACETAMINOPHEN 650 MG RE SUPP: 650.0000 mg | Freq: Four times a day (QID) | RECTAL | Status: DC | PRN

## 2019-11-07 MED ORDER — ONDANSETRON HCL 4 MG PO TABS: 4.0000 mg | ORAL_TABLET | Freq: Four times a day (QID) | ORAL | Status: DC | PRN

## 2019-11-07 MED ORDER — HEPARIN SODIUM (PORCINE) 5000 UNIT/ML IJ SOLN
5000.0000 [IU] | Freq: Three times a day (TID) | INTRAMUSCULAR | Status: DC
  Administered 2019-11-07 – 2019-11-08 (×6): 5000 [IU] via SUBCUTANEOUS
  Filled 2019-11-07 (×8): qty 1

## 2019-11-07 MED ORDER — ONDANSETRON HCL 4 MG/2ML IJ SOLN
4.0000 mg | Freq: Four times a day (QID) | INTRAMUSCULAR | Status: DC | PRN
  Administered 2019-11-07: 4 mg via INTRAVENOUS
  Filled 2019-11-07: qty 2

## 2019-11-07 NOTE — Progress Notes (Signed)
Assisted patient to restroom. RN assisted with steadying patient and with tubings. Patient requesting to sit in chair upon return from bathroom. Patient sitting in recliner. Denies any other needs at this time.

## 2019-11-07 NOTE — Consult Note (Signed)
Reason for Consult: Small bowel obstruction Referring Physician: Dr. Sharolyn Douglas is an 86 y.o. female.  HPI: Patient is a 86 year old white female with multiple medical problems including chronic abdominal pain who presented to the emergency room yesterday with nausea and vomiting.  A CT scan of the abdomen was performed which revealed a bowel obstruction in the right lower abdomen with a possible transition zone present.  She also has a known incisional hernia which contains bowel but is not causing the obstruction.  She saw Dr. Constance Haw in March of this year for her incisional hernia but it was felt that she was a high risk surgical candidate.  She had a nasogastric tube placed and was admitted to the hospital for further evaluation and treatment.  She states she has no abdominal pain at the present time.  She cannot remember exactly when she had her last bowel movement.  She is passing flatus.  Past Medical History:  Diagnosis Date  . Abdominal pain, chronic, right lower quadrant    "ever since I had my appendix removed"  . Atrial fibrillation ([place])   . Breast cancer ([place])   . Cancer ([place])    left side  . Chronic back pain   . Edema   . GERD (gastroesophageal reflux disease)   . Hyperlipidemia   . Hypertension   . Osteopenia   . Osteoporosis   . Ovarian cyst 02/2015   left  . Recurrent abdominal pain    LLQ    Past Surgical History:  Procedure Laterality Date  . APPENDECTOMY    . APPENDECTOMY    . BREAST SURGERY Left    lumpectomy  . CARDIOVERSION N/A 03/31/2016   Procedure: CARDIOVERSION;  Surgeon: Josue Hector, MD;  Location: [facility];  Service: Cardiovascular;  Laterality: N/A;  . COLON RESECTION    . COLON SURGERY    . HERNIA REPAIR    . HERNIA REPAIR      Family History  Problem Relation Age of Onset  . Diabetes Mother   . Heart disease Other   . Arthritis Other   . Cancer Other   . Hyperlipidemia Brother     Social History:  reports that she has  never smoked. She has never used smokeless tobacco. She reports that she does not drink alcohol and does not use drugs.  Allergies:  Allergies  Allergen Reactions  . Amiodarone Itching  . Amoxicillin Hives  . Cephalosporins Itching  . Lipitor [Atorvastatin] Other (See Comments)    Unknown:patient is not aware of this allergy  . Lorazepam Other (See Comments)    Exceptional fatigue   . Lovenox [Enoxaparin Sodium] Nausea And Vomiting  . Morphine And Related Itching  . Penicillins Swelling       . Sulfa Antibiotics Swelling    Medications:  Scheduled: . fluticasone  1 spray Each Nare Daily  . heparin  5,000 Units Subcutaneous Q8H  . insulin aspart  0-15 Units Subcutaneous TID WC  . insulin aspart  0-5 Units Subcutaneous QHS    Results for orders placed or performed during the hospital encounter of 11/06/19 (from the past 48 hour(s))  CBC with Differential     Status: Abnormal   Collection Time: 11/06/19  7:55 PM  Result Value Ref Range   WBC 17.6 (H) 4.0 - 10.5 K/uL   RBC 4.66 3.87 - 5.11 MIL/uL   Hemoglobin 13.7 12.0 - 15.0 g/dL   HCT 41.8 36 - 46 %   MCV  89.7 80.0 - 100.0 fL   MCH 29.4 26.0 - 34.0 pg   MCHC 32.8 30.0 - 36.0 g/dL   RDW 12.2 11.5 - 15.5 %   Platelets 350 150 - 400 K/uL   nRBC 0.0 0.0 - 0.2 %   Neutrophils Relative % 85 %   Neutro Abs 15.0 (H) 1.7 - 7.7 K/uL   Lymphocytes Relative 10 %   Lymphs Abs 1.8 0.7 - 4.0 K/uL   Monocytes Relative 4 %   Monocytes Absolute 0.6 0 - 1 K/uL   Eosinophils Relative 0 %   Eosinophils Absolute 0.1 0 - 0 K/uL   Basophils Relative 0 %   Basophils Absolute 0.0 0 - 0 K/uL   Immature Granulocytes 1 %   Abs Immature Granulocytes 0.08 (H) 0.00 - 0.07 K/uL    Comment: Performed at [facility], [address] [place], [place] [address]  Comprehensive metabolic panel     Status: Abnormal   Collection Time: 11/06/19  7:55 PM  Result Value Ref Range   Sodium 138 135 - 145 mmol/L   Potassium 4.1 3.5 - 5.1 mmol/L   Chloride  98 98 - 111 mmol/L   CO2 23 22 - 32 mmol/L   Glucose, Bld 199 (H) 70 - 99 mg/dL    Comment: Glucose reference range applies only to samples taken after fasting for at least 8 hours.   BUN 38 (H) 8 - 23 mg/dL   Creatinine, Ser 1.84 (H) 0.44 - 1.00 mg/dL   Calcium 9.7 8.9 - 10.3 mg/dL   Total Protein 8.4 (H) 6.5 - 8.1 g/dL   Albumin 4.5 3.5 - 5.0 g/dL   AST 25 15 - 41 U/L   ALT 17 0 - 44 U/L   Alkaline Phosphatase 64 38 - 126 U/L   Total Bilirubin 0.8 0.3 - 1.2 mg/dL   GFR calc non Af Amer 23 (L) >60 mL/min   GFR calc Af Amer 27 (L) >60 mL/min   Anion gap 17 (H) 5 - 15    Comment: Performed at [facility], [address], [place], [place] [address]  Lipase, blood     Status: None   Collection Time: 11/06/19  7:55 PM  Result Value Ref Range   Lipase 31 11 - 51 U/L    Comment: Performed at [facility], [address], [place], [place] [address]  Urinalysis, Routine w reflex microscopic     Status: Abnormal   Collection Time: 11/06/19  9:32 PM  Result Value Ref Range   Color, Urine YELLOW YELLOW   APPearance HAZY (A) CLEAR   Specific Gravity, Urine 1.012 1.005 - 1.030   pH 6.0 5.0 - 8.0   Glucose, UA NEGATIVE NEGATIVE mg/dL   Hgb urine dipstick NEGATIVE NEGATIVE   Bilirubin Urine NEGATIVE NEGATIVE   Ketones, ur NEGATIVE NEGATIVE mg/dL   Protein, ur NEGATIVE NEGATIVE mg/dL   Nitrite POSITIVE (A) NEGATIVE   Leukocytes,Ua LARGE (A) NEGATIVE   RBC / HPF 0-5 0 - 5 RBC/hpf   WBC, UA >50 (H) 0 - 5 WBC/hpf   Bacteria, UA FEW (A) NONE SEEN   Squamous Epithelial / LPF 0-5 0 - 5   WBC Clumps PRESENT     Comment: Performed at [facility], [address] [place],  [address]  SARS Coronavirus 2 by RT PCR (hospital order, performed in [facility] hospital lab) Nasopharyngeal Urine, Clean Catch     Status: None   Collection Time: 11/06/19 10:16 PM  Specimen: Urine, Clean Catch; Nasopharyngeal  Result Value Ref Range   SARS Coronavirus 2 NEGATIVE NEGATIVE    Comment:  (NOTE) SARS-CoV-2 target nucleic acids are NOT DETECTED.  The SARS-CoV-2 RNA is generally detectable in upper and lower respiratory specimens during the acute phase of infection. The lowest concentration of SARS-CoV-2 viral copies this assay can detect is 250 copies / mL. A negative result does not preclude SARS-CoV-2 infection and should not be used as the sole basis for treatment or other patient management decisions.  A negative result may occur with improper specimen collection / handling, submission of specimen other than nasopharyngeal swab, presence of viral mutation(s) within the areas targeted by this assay, and inadequate number of viral copies (<250 copies / mL). A negative result must be combined with clinical observations, patient history, and epidemiological information.  Fact Sheet for Patients:   StrictlyIdeas.no  Fact Sheet for Healthcare Providers: BankingDealers.co.za  This test is not yet approved or  cleared by the [place] FDA and has been authorized for detection and/or diagnosis of SARS-CoV-2 by FDA under an Emergency Use Authorization (EUA).  This EUA will remain in effect (meaning this test can be used) for the duration of the COVID-19 declaration under Section 564(b)(1) of the Act, 21 U.S.C. section 360bbb-3(b)(1), unless the authorization is terminated or revoked sooner.  Performed at [facility], [address], [place], [place] [address]     CT ABDOMEN PELVIS WO CONTRAST  Result Date: 11/06/2019 CLINICAL DATA:  Abdomen pain EXAM: CT ABDOMEN AND PELVIS WITHOUT CONTRAST TECHNIQUE: Multidetector CT imaging of the abdomen and pelvis was performed following the standard protocol without IV contrast. COMPARISON:  CT 07/25/2019, 06/22/2018 FINDINGS: Lower chest: Lung bases demonstrate no acute consolidation or pleural effusion. Coronary vascular calcification. Hepatobiliary: No focal liver abnormality is  seen. No gallstones, gallbladder wall thickening, or biliary dilatation. Pancreas: Unremarkable. No pancreatic ductal dilatation or surrounding inflammatory changes. Spleen: Normal in size without focal abnormality. Adrenals/Urinary Tract: Adrenal glands are normal. Mild atrophy right kidney. No hydronephrosis. Urinary bladder is unremarkable. Stomach/Bowel: The stomach is nonenlarged. Mildly dilated fluid-filled proximal and mid small bowel measuring up to 3.5 cm in diameter without bowel wall thickening. Transition to nondilated distal small bowel in the left lower quadrant/pelvis, consistent with bowel obstruction, likely due to adhesion. There is a small low anterior ventral hernia containing portion of dilated small bowel loops but this does not appear to be the source of obstruction. Vascular/Lymphatic: Extensive aortic atherosclerosis. No aneurysm. No suspicious adenopathy Reproductive: Uterus and bilateral adnexa are unremarkable. Other: No free air or free fluid. Musculoskeletal: Degenerative changes. No acute or suspicious osseous abnormality. IMPRESSION: 1. Findings consistent with small bowel obstruction, transition point visible in the left lower quadrant/pelvis, and felt likely due to adhesions. There is a small low ventral hernia containing a portion of dilated small bowel but this does not appear to be source of obstruction. 2. Mildly atrophic right kidney.  Negative for hydronephrosis Electronically Signed   By: Donavan Foil M.D.   On: 11/06/2019 21:35   DG Chest Portable 1 View  Result Date: 11/07/2019 CLINICAL DATA:  Post NG tube placement EXAM: PORTABLE CHEST 1 VIEW COMPARISON:  10/11/2017 FINDINGS: NG tube is in the stomach. Left base atelectasis. Right lung clear. Heart is normal size. No effusions or acute bony abnormality. IMPRESSION: NG tube in the stomach. Left base atelectasis. Electronically Signed   By: Rolm Baptise M.D.   On: 11/07/2019 01:18    ROS:  Pertinent items are noted  in HPI.  Blood pressure (!) 147/63, pulse 82, temperature 99.7 F (37.6 C), temperature source Oral, resp. rate 16, height 5\' 2"  (1.575 m), weight 65.4 kg, SpO2 91 %. Physical Exam: Pleasant white female in no acute distress Head is normocephalic, atraumatic Lungs are clear to auscultation with equal breath sounds bilaterally Heart examination reveals a regular rate and rhythm without S3, S4, murmurs Abdomen is soft with bowel sounds present.  No tenderness or rigidity are noted.  No significant distention is noted.  A lower abdominal incisional hernia is present which is reducible.  CT scan images personally reviewed  Assessment/Plan: Impression: Partial small bowel obstruction most likely secondary adhesive disease.  She does have a leukocytosis which is secondary to a UTI.  This is being addressed.  She has not had much output from her NG tube.  We will leave that in today.  No need for surgical intervention.  It would be a high risk procedure for the patient.  Will start Dulcolax suppositories.  Will follow with you.  Aviva Signs 11/07/2019, 7:22 AM

## 2019-11-07 NOTE — Plan of Care (Signed)

## 2019-11-07 NOTE — Progress Notes (Addendum)
  Patient seen and evaluated, chart reviewed, please see EMR for updated orders. Please see full H&P dictated by admitting physician Dr. Humphrey Rolls for same date of service.    1)SBO--continue NG tube, surgical consult from Dr. Arnoldo Morale appreciated -Recommend conservative management for now  2) chronic atrial fibrillation--- patient previously taken off Xarelto due to bleeding over 4 months ago -May use IV metoprolol as needed for rate control  3)AKI----acute kidney injury on CKD stage - IIIb  -Worsening renal function is due to dehydration in setting of GI losses and poor oral intake compounded by lisinopril and torsemide use --- creatinine on admission=1.84  , baseline creatinine = 1.4 to 1.6    , creatinine is now= 1.46  , renally adjust medications, avoid nephrotoxic agents / dehydration  / hypotension -Continue to hydrate -Hold torsemide and lisinopril  4)HTN--IV labetalol as needed, hold torsemide, hold metoprolol and lisinopril as well as Cardizem until oral intake resumes  5)Presumed UTI--- c/n IV Rocephin pending cultures  Patient seen and evaluated, chart reviewed, please see EMR for updated orders. Please see full H&P dictated by admitting physician Dr. Humphrey Rolls for same date of service.

## 2019-11-07 NOTE — H&P (Signed)
History and Physical    SOLARIS KRAM GGY:[id number] DOB: 04-28-1924 DOA: 11/06/2019  PCP: [name], MD (Confirm with patient/family/NH records and if not entered, this has to be entered at [facility] point of entry) Patient coming from: Home  I have personally briefly reviewed patient's old medical records in [place]  Chief Complaint: Intractable nominal pain, nausea and vomiting  HPI: [name] is a 86 y.o. female with medical history significant of hypertension, hyperlipidemia, atrial fibrillation, GERD and chronic back pain presented to ED for evaluation of intractable nausea, vomiting and abdominal pain.  Patient states that he has a history of chronic abdominal pain but this pain is totally different and started in right upper quadrant and slowly become generalized..  Patient states that she is having severe nausea with few episodes of vomiting for the last 2 days and her last bowel movement was yesterday and denying passing flatus.  Patient otherwise denies fever, chills, chest pain, shortness of breath .  ED Course: On arrival to the ED patient had blood pressure of 157/58, heart rate 85, temperature 98.6, respiratory rate 18 and oxygen saturation 96% on room air.  Blood work showed WBC 17.6, hemoglobin 13.7, sodium 138, potassium 4.1, BUN 38, creatinine 1.84 and blood glucose 199.  Covid test negative.  Chest x-ray negative.  UA positive for UTI.  Patient was given a bolus of IV normal saline in the ED, 2 doses of IV fentanyl and Zofran.  Patient was also given IV ceftriaxone for UTI.  ED physician contacted Dr. Baldo Daub surgery who recommended to place NG tube and will see the patient in the morning.  Review of Systems: As per HPI otherwise 10 point review of systems negative.  Unacceptable ROS statements: "10 systems reviewed," "Extensive" (without elaboration).  Acceptable ROS statements: "All others negative," "All others reviewed and are negative," and "All  others unremarkable," with at [place] documented Can't double dip - if using for HPI can't use for ROS  Past Medical History:  Diagnosis Date  . Abdominal pain, chronic, right lower quadrant    "ever since I had my appendix removed"  . Atrial fibrillation ([place])   . Breast cancer ([place])   . Cancer ([place])    left side  . Chronic back pain   . Edema   . GERD (gastroesophageal reflux disease)   . Hyperlipidemia   . Hypertension   . Osteopenia   . Osteoporosis   . Ovarian cyst 02/2015   left  . Recurrent abdominal pain    LLQ    Past Surgical History:  Procedure Laterality Date  . APPENDECTOMY    . APPENDECTOMY    . BREAST SURGERY Left    lumpectomy  . CARDIOVERSION N/A 03/31/2016   Procedure: CARDIOVERSION;  Surgeon: Josue Hector, MD;  Location: [facility];  Service: Cardiovascular;  Laterality: N/A;  . COLON RESECTION    . COLON SURGERY    . HERNIA REPAIR    . HERNIA REPAIR       reports that she has never smoked. She has never used smokeless tobacco. She reports that she does not drink alcohol and does not use drugs.  Allergies  Allergen Reactions  . Amiodarone Itching  . Amoxicillin Hives  . Cephalosporins Itching  . Lipitor [Atorvastatin] Other (See Comments)    Unknown:patient is not aware of this allergy  . Lorazepam Other (See Comments)    Exceptional fatigue   . Lovenox [Enoxaparin Sodium] Nausea And Vomiting  .  Morphine And Related Itching  . Penicillins Swelling       . Sulfa Antibiotics Swelling    Family History  Problem Relation Age of Onset  . Diabetes Mother   . Heart disease Other   . Arthritis Other   . Cancer Other   . Hyperlipidemia Brother     Unacceptable: Noncontributory, unremarkable, or negative. Acceptable: (example)Family history negative for heart disease  Prior to Admission medications   Medication Sig Start Date End Date Taking? Authorizing Provider  ALPRAZolam Duanne Moron) 0.25 MG tablet Take one tablet by mouth twice daily  prn needed for anxiety or sleep Patient taking differently: Take 0.25 mg by mouth 2 (two) times daily as needed for anxiety or sleep.  08/13/19  Yes [name], MD  diltiazem (CARDIZEM CD) 120 MG 24 hr capsule Take one capsule by mouth once daily Patient taking differently: Take 120 mg by mouth daily.  08/13/19  Yes Luking, Elayne Snare, MD  HYDROcodone-acetaminophen (NORCO/VICODIN) 5-325 MG tablet TAKE 1/2-1 TABLET BY MOUTH EVERY TWELVE HOURS AS NEEDED. Patient taking differently: Take 0.5-1 tablets by mouth every 12 (twelve) hours as needed for moderate pain or severe pain.  10/30/19  Yes [name], MD  levothyroxine (SYNTHROID) 75 MCG tablet Take 1 tablet (75 mcg total) by mouth daily. 08/13/19  Yes Luking, Elayne Snare, MD  lisinopril (ZESTRIL) 2.5 MG tablet Take 1 tablet (2.5 mg total) by mouth daily. 08/13/19  Yes Luking, Elayne Snare, MD  metoprolol succinate (TOPROL-XL) 50 MG 24 hr tablet TAKE 1 TABLET BY MOUTH DAILY. TAKE WITH OR IMMEDIATELY FOLLOWING A MEAL. Patient taking differently: Take 50 mg by mouth daily. TAKE WITH OR IMMEDIATELY FOLLOWING A MEAL. 08/13/19  Yes [name], MD  Multiple Vitamin (MULTIVITAMIN) tablet Take 1 tablet by mouth daily.   Yes [provider]  pantoprazole (PROTONIX) 40 MG tablet Take 1 tablet (40 mg total) by mouth daily. 08/13/19  Yes Luking, Elayne Snare, MD  potassium chloride (KLOR-CON) 10 MEQ tablet Take 1 tablet (10 mEq total) by mouth daily. 08/13/19  Yes Luking, Elayne Snare, MD  QC ACETAMINOPHEN 8 HOURS 650 MG CR tablet Take 1 tablet by mouth in the morning and at bedtime. 10/23/19  Yes [provider]  sertraline (ZOLOFT) 50 MG tablet Take 1 tablet (50 mg total) by mouth daily. 08/13/19  Yes [name], MD  torsemide (DEMADEX) 20 MG tablet Take one tablet every other day Patient taking differently: Take 20 mg by mouth every other day.  08/13/19  Yes [name], MD  vitamin B-12 1000 MCG tablet Take 1 tablet (1,000 mcg total) by mouth daily.  07/21/17  Yes Mary Sella, NP    Physical Exam: Vitals:   11/06/19 2330 11/07/19 0000 11/07/19 0030 11/07/19 0139  BP: 140/70 (!) 146/59 121/84 (!) 162/70  Pulse: 85 74 84 89  Resp:  18  16  Temp:    98.4 F (36.9 C)  TempSrc:    Oral  SpO2: 96% 95% 91% 93%  Weight:    65.4 kg  Height:    5\' 2"  (1.575 m)    Constitutional: NAD, calm, comfortable Vitals:   11/06/19 2330 11/07/19 0000 11/07/19 0030 11/07/19 0139  BP: 140/70 (!) 146/59 121/84 (!) 162/70  Pulse: 85 74 84 89  Resp:  18  16  Temp:    98.4 F (36.9 C)  TempSrc:    Oral  SpO2: 96% 95% 91% 93%  Weight:    65.4  kg  Height:    5\' 2"  (1.575 m)    General: Patient is a 86 year old female in acute distress secondary to abdominal pain. Eyes: PERRL, lids and conjunctivae normal ENMT: Mucous membranes are moist. Posterior pharynx clear of any exudate or lesions.Normal dentition.  Neck: normal, supple, no masses, no thyromegaly Respiratory: clear to auscultation bilaterally, no wheezing, no crackles. Normal respiratory effort. No accessory muscle use.  Cardiovascular: Regular rate and rhythm, no murmurs / rubs / gallops. No extremity edema. 2+ pedal pulses. No carotid bruits.  Abdomen: Generalized abdominal tenderness and suprapubic tenderness, no masses palpated. No hepatosplenomegaly. Bowel sounds are sluggish.  Musculoskeletal: no clubbing / cyanosis. No joint deformity upper and lower extremities. Good ROM, no contractures. Normal muscle tone.  Skin: no rashes, lesions, ulcers. No induration Neurologic: CN 2-12 grossly intact. Sensation intact, DTR normal. Strength 5/5 in all 4.  Psychiatric: Normal judgment and insight. Alert and oriented x 3. Normal mood.   (Anything < 9 systems with 2 bullets each down codes to level 1) (If patient refuses exam can't bill higher level) (Make sure to document decubitus ulcers present on admission -- if possible -- and whether patient has chronic indwelling catheter at time of  admission)  Labs on Admission: I have personally reviewed following labs and imaging studies  CBC: Recent Labs  Lab 11/06/19 1955  WBC 17.6*  NEUTROABS 15.0*  HGB 13.7  HCT 41.8  MCV 89.7  PLT 789   Basic Metabolic Panel: Recent Labs  Lab 11/06/19 1955  NA 138  K 4.1  CL 98  CO2 23  GLUCOSE 199*  BUN 38*  CREATININE 1.84*  CALCIUM 9.7   GFR: Estimated Creatinine Clearance: 16.9 mL/min (A) (by C-G formula based on SCr of 1.84 mg/dL (H)). Liver Function Tests: Recent Labs  Lab 11/06/19 1955  AST 25  ALT 17  ALKPHOS 64  BILITOT 0.8  PROT 8.4*  ALBUMIN 4.5   Recent Labs  Lab 11/06/19 1955  LIPASE 31   No results for input(s): AMMONIA in the last 168 hours. Coagulation Profile: No results for input(s): INR, PROTIME in the last 168 hours. Cardiac Enzymes: No results for input(s): CKTOTAL, CKMB, CKMBINDEX, TROPONINI in the last 168 hours. BNP (last 3 results) No results for input(s): PROBNP in the last 8760 hours. HbA1C: No results for input(s): HGBA1C in the last 72 hours. CBG: No results for input(s): GLUCAP in the last 168 hours. Lipid Profile: No results for input(s): CHOL, HDL, LDLCALC, TRIG, CHOLHDL, LDLDIRECT in the last 72 hours. Thyroid Function Tests: No results for input(s): TSH, T4TOTAL, FREET4, T3FREE, THYROIDAB in the last 72 hours. Anemia Panel: No results for input(s): VITAMINB12, FOLATE, FERRITIN, TIBC, IRON, RETICCTPCT in the last 72 hours. Urine analysis:    Component Value Date/Time   COLORURINE YELLOW 11/06/2019 2132   APPEARANCEUR HAZY (A) 11/06/2019 2132   LABSPEC 1.012 11/06/2019 2132   PHURINE 6.0 11/06/2019 2132   GLUCOSEU NEGATIVE 11/06/2019 2132   HGBUR NEGATIVE 11/06/2019 2132   BILIRUBINUR NEGATIVE 11/06/2019 2132   BILIRUBINUR + 12/20/2012 1107   KETONESUR NEGATIVE 11/06/2019 2132   PROTEINUR NEGATIVE 11/06/2019 2132   UROBILINOGEN 0.2 04/04/2015 1018   NITRITE POSITIVE (A) 11/06/2019 2132   LEUKOCYTESUR LARGE (A)  11/06/2019 2132    Radiological Exams on Admission: CT ABDOMEN PELVIS WO CONTRAST  Result Date: 11/06/2019 CLINICAL DATA:  Abdomen pain EXAM: CT ABDOMEN AND PELVIS WITHOUT CONTRAST TECHNIQUE: Multidetector CT imaging of the abdomen and pelvis was performed following the  standard protocol without IV contrast. COMPARISON:  CT 07/25/2019, 06/22/2018 FINDINGS: Lower chest: Lung bases demonstrate no acute consolidation or pleural effusion. Coronary vascular calcification. Hepatobiliary: No focal liver abnormality is seen. No gallstones, gallbladder wall thickening, or biliary dilatation. Pancreas: Unremarkable. No pancreatic ductal dilatation or surrounding inflammatory changes. Spleen: Normal in size without focal abnormality. Adrenals/Urinary Tract: Adrenal glands are normal. Mild atrophy right kidney. No hydronephrosis. Urinary bladder is unremarkable. Stomach/Bowel: The stomach is nonenlarged. Mildly dilated fluid-filled proximal and mid small bowel measuring up to 3.5 cm in diameter without bowel wall thickening. Transition to nondilated distal small bowel in the left lower quadrant/pelvis, consistent with bowel obstruction, likely due to adhesion. There is a small low anterior ventral hernia containing portion of dilated small bowel loops but this does not appear to be the source of obstruction. Vascular/Lymphatic: Extensive aortic atherosclerosis. No aneurysm. No suspicious adenopathy Reproductive: Uterus and bilateral adnexa are unremarkable. Other: No free air or free fluid. Musculoskeletal: Degenerative changes. No acute or suspicious osseous abnormality. IMPRESSION: 1. Findings consistent with small bowel obstruction, transition point visible in the left lower quadrant/pelvis, and felt likely due to adhesions. There is a small low ventral hernia containing a portion of dilated small bowel but this does not appear to be source of obstruction. 2. Mildly atrophic right kidney.  Negative for hydronephrosis  Electronically Signed   By: Donavan Foil M.D.   On: 11/06/2019 21:35   DG Chest Portable 1 View  Result Date: 11/07/2019 CLINICAL DATA:  Post NG tube placement EXAM: PORTABLE CHEST 1 VIEW COMPARISON:  10/11/2017 FINDINGS: NG tube is in the stomach. Left base atelectasis. Right lung clear. Heart is normal size. No effusions or acute bony abnormality. IMPRESSION: NG tube in the stomach. Left base atelectasis. Electronically Signed   By: Rolm Baptise M.D.   On: 11/07/2019 01:18     Assessment/Plan Principal Problem:   SBO (small bowel obstruction) ([place]) Patient presented with intractable nausea, vomiting and abdominal pain and CT abdomen/pelvis was done that was positive for small bowel obstruction. ED physician contacted general surgery who will see the patient in the morning. NG tube placed as per general surgery recommendations. N.p.o. IV Zofran for nausea and vomiting. IV normal saline at the rate of 75 mL/h. Tylenol for fever as needed. IV fentanyl every 4 hours as needed for moderate and severe pain.  Active Problems:  Nausea and vomiting IV Zofran as needed for nausea and vomiting  Hyperglycemia Patient is having hyperglycemia with no history of diabetes mellitus. Low-dose sliding scale insulin ordered. Blood glucose monitoring and hypoglycemic protocol in place.  Acute kidney injury Continue IV normal saline at the rate of 75 mL/h. Continue to monitor creatinine level  Atrial fibrillation Stable. Patient states that she was on Xarelto for anticoagulation that was stopped 4 months ago because of acute bleeding. Patient will be continued on home medications after confirmation from the pharmacy.    Major depression Patient denies any signs and symptoms of depression and anxiety at this time. Continue home medications after confirmation by pharmacy.      Hypothyroid Continue home medication after confirmation by pharmacy.    DVT prophylaxis: Heparin Code Status: Full  code Family Communication: No family member present at the bedside  Disposition Plan:  Consults called: General surgery consult ordered for ED physician Admission status: Inpatient/MedSurg   Edmonia Lynch MD [facility] Pager [phone]   If 7PM-7AM, please contact night-coverage [email or web address] Password [facility]  11/07/2019, 4:37 AM

## 2019-11-08 LAB — GLUCOSE, CAPILLARY
Glucose-Capillary: 108 mg/dL — ABNORMAL HIGH (ref 70–99)
Glucose-Capillary: 97 mg/dL (ref 70–99)
Glucose-Capillary: 91 mg/dL (ref 70–99)
Glucose-Capillary: 89 mg/dL (ref 70–99)

## 2019-11-08 LAB — HEMOGLOBIN A1C
Hgb A1c MFr Bld: 6 % — ABNORMAL HIGH (ref 4.8–5.6)
Mean Plasma Glucose: 126 mg/dL

## 2019-11-08 MED ORDER — POLYETHYLENE GLYCOL 3350 17 G PO PACK
17.0000 g | PACK | Freq: Every day | ORAL | Status: DC
  Administered 2019-11-08 – 2019-11-09 (×2): 17 g via ORAL
  Filled 2019-11-08 (×2): qty 1

## 2019-11-08 MED ORDER — ALPRAZOLAM 0.25 MG PO TABS
0.2500 mg | ORAL_TABLET | Freq: Once | ORAL | Status: AC
  Administered 2019-11-08: 0.25 mg via ORAL
  Filled 2019-11-08: qty 1

## 2019-11-08 NOTE — Progress Notes (Signed)
Subjective: Patient denies any abdominal pain.  She states she is passing flatus and is having multiple bowel movements.  Objective: Vital signs in last 24 hours: Temp:  [97.6 F (36.4 C)-98.7 F (37.1 C)] 98.7 F (37.1 C) (06/18 0420) Pulse Rate:  [66-71] 66 (06/18 0420) Resp:  [17-18] 18 (06/18 0420) BP: (151-164)/(72-84) 151/72 (06/18 0420) SpO2:  [96 %-97 %] 97 % (06/18 0420) Last BM Date: 11/06/19  Intake/Output from previous day: 06/17 0701 - 06/18 0700 In: 1282.2 [I.V.:1282.2] Out: 150 [Emesis/NG output:150] Intake/Output this shift: No intake/output data recorded.  General appearance: alert, cooperative and no distress GI: soft, non-tender; bowel sounds normal; no masses,  no organomegaly  Lab Results:  Recent Labs    11/06/19 1955 11/07/19 0751  WBC 17.6* 10.5  HGB 13.7 10.6*  HCT 41.8 33.6*  PLT 350 239   BMET Recent Labs    11/06/19 1955 11/07/19 0751  NA 138 144  K 4.1 3.8  CL 98 109  CO2 23 24  GLUCOSE 199* 107*  BUN 38* 30*  CREATININE 1.84* 1.46*  CALCIUM 9.7 8.0*   PT/INR No results for input(s): LABPROT, INR in the last 72 hours.  Studies/Results: CT ABDOMEN PELVIS WO CONTRAST  Result Date: 11/06/2019 CLINICAL DATA:  Abdomen pain EXAM: CT ABDOMEN AND PELVIS WITHOUT CONTRAST TECHNIQUE: Multidetector CT imaging of the abdomen and pelvis was performed following the standard protocol without IV contrast. COMPARISON:  CT 07/25/2019, 06/22/2018 FINDINGS: Lower chest: Lung bases demonstrate no acute consolidation or pleural effusion. Coronary vascular calcification. Hepatobiliary: No focal liver abnormality is seen. No gallstones, gallbladder wall thickening, or biliary dilatation. Pancreas: Unremarkable. No pancreatic ductal dilatation or surrounding inflammatory changes. Spleen: Normal in size without focal abnormality. Adrenals/Urinary Tract: Adrenal glands are normal. Mild atrophy right kidney. No hydronephrosis. Urinary bladder is  unremarkable. Stomach/Bowel: The stomach is nonenlarged. Mildly dilated fluid-filled proximal and mid small bowel measuring up to 3.5 cm in diameter without bowel wall thickening. Transition to nondilated distal small bowel in the left lower quadrant/pelvis, consistent with bowel obstruction, likely due to adhesion. There is a small low anterior ventral hernia containing portion of dilated small bowel loops but this does not appear to be the source of obstruction. Vascular/Lymphatic: Extensive aortic atherosclerosis. No aneurysm. No suspicious adenopathy Reproductive: Uterus and bilateral adnexa are unremarkable. Other: No free air or free fluid. Musculoskeletal: Degenerative changes. No acute or suspicious osseous abnormality. IMPRESSION: 1. Findings consistent with small bowel obstruction, transition point visible in the left lower quadrant/pelvis, and felt likely due to adhesions. There is a small low ventral hernia containing a portion of dilated small bowel but this does not appear to be source of obstruction. 2. Mildly atrophic right kidney.  Negative for hydronephrosis Electronically Signed   By: Donavan Foil M.D.   On: 11/06/2019 21:35   DG Chest Portable 1 View  Result Date: 11/07/2019 CLINICAL DATA:  Post NG tube placement EXAM: PORTABLE CHEST 1 VIEW COMPARISON:  10/11/2017 FINDINGS: NG tube is in the stomach. Left base atelectasis. Right lung clear. Heart is normal size. No effusions or acute bony abnormality. IMPRESSION: NG tube in the stomach. Left base atelectasis. Electronically Signed   By: Rolm Baptise M.D.   On: 11/07/2019 01:18    Anti-infectives: Anti-infectives (From admission, onward)   Start     Dose/Rate Route Frequency Ordered Stop   11/07/19 0830  cefTRIAXone (ROCEPHIN) 1 g in sodium chloride 0.9 % 100 mL IVPB     Discontinue  1 g 200 mL/hr over 30 Minutes Intravenous Every 24 hours 11/07/19 4360        Assessment/Plan: Impression: Partial small bowel obstruction,  resolving. Plan: Will remove NG tube and start full liquid diet.  No need for acute surgical intervention at this time.  LOS: 1 day    Aviva Signs 11/08/2019

## 2019-11-08 NOTE — Progress Notes (Signed)
Patient Demographics:    [name], is a 86 y.o. female, DOB - Aug 08, 1923, DJS:[id number]  Admit date - 11/06/2019   Admitting Physician Edmonia Lynch, DO  Outpatient Primary MD for the patient is Katherine Drown, MD  LOS - 1   Chief Complaint  Patient presents with  . Emesis        Subjective:    [name] today has no fevers, no emesis,  No chest pain,   -Passing gas patient with small bowel obstruction,-- requiring IV fluids until oral intake is reliable  Assessment  & Plan :    Principal Problem:   SBO (small bowel obstruction) (HCC) Active Problems:   Atrial fibrillation (HCC)   Major depression   Hypothyroid   Nausea and vomiting   Hyperglycemia   Brief Summary:- 86 y.o. female with medical history significant of hypertension, hyperlipidemia, atrial fibrillation, GERD and chronic back pain admitted 11/07/19 with SBO--  A/p 1)SBO-- , surgical consult from Dr. Arnoldo Morale appreciated -Recommend conservative management for now -Passing gas, per Surgeon ok to remove NG tube and try liquid diet  2)Chronic Atrial Fibrillation--- patient previously taken off Xarelto due to bleeding over 4 months ago -May use IV metoprolol as needed for rate control  3)AKI----acute kidney injury on CKD stage - IIIb  -Worsening renal function is due to dehydration in setting of GI losses and poor oral intake compounded by lisinopril and torsemide use --- creatinine on admission=1.84  , baseline creatinine = 1.4 to 1.6    , creatinine is now= 1.46  , renally adjust medications, avoid nephrotoxic agents / dehydration  / hypotension -Continue to hydrate -Hold torsemide and lisinopril  4)HTN--IV labetalol as needed, hold torsemide, hold metoprolol and lisinopril as well as Cardizem until oral intake resumes  5)E coli  UTI--- c/n IV Rocephin pending cultures  Disposition/Need for in-Hospital Stay-  patient unable to be discharged at this time due to --- patient with small bowel obstruction,-- requiring IV fluids until oral intake is reliable  Status is: Inpatient  Remains inpatient appropriate because:IV treatments appropriate due to intensity of illness or inability to take PO and Inpatient level of care appropriate due to severity of illness  Disposition: The patient is from: Home              Anticipated d/c is to: Home              Anticipated d/c date is: 2 days              Patient currently is not medically stable to d/c. Barriers: Not Clinically Stable- --patient with small bowel obstruction,-- requiring IV fluids until oral intake is reliable  Code Status : full code  Family Communication:     (patient is alert, awake and coherent)   Consults  :  Gen surgery  DVT Prophylaxis  :   - Heparin - SCDs   Lab Results  Component Value Date   PLT 239 11/07/2019    Inpatient Medications  Scheduled Meds: . fluticasone  1 spray Each Nare Daily  . heparin  5,000 Units Subcutaneous Q8H  . insulin aspart  0-15 Units Subcutaneous TID WC  . insulin aspart  0-5 Units Subcutaneous QHS  . polyethylene glycol  17 g Oral Daily   Continuous Infusions: . cefTRIAXone (ROCEPHIN)  IV Stopped (11/08/19 1816)  . dextrose 5 % and 0.45 % NaCl with KCl 20 mEq/L 50 mL/hr at 11/08/19 1100   PRN Meds:.acetaminophen **OR** acetaminophen, fentaNYL (SUBLIMAZE) injection, ondansetron **OR** ondansetron (ZOFRAN) IV    Anti-infectives (From admission, onward)   Start     Dose/Rate Route Frequency Ordered Stop   11/07/19 0830  cefTRIAXone (ROCEPHIN) 1 g in sodium chloride 0.9 % 100 mL IVPB     Discontinue     1 g 200 mL/hr over 30 Minutes Intravenous Every 24 hours 11/07/19 0821          Objective:   Vitals:   11/07/19 0522 11/07/19 1504 11/08/19 0420 11/08/19 1407  BP: (!) 147/63 (!) 164/84 (!) 151/72 (!) 155/54  Pulse: 82 71 66 66  Resp: 16 17 18 20   Temp: 99.7 F (37.6 C) 97.6 F  (36.4 C) 98.7 F (37.1 C) 98.1 F (36.7 C)  TempSrc: Oral Oral    SpO2: 91% 96% 97% 93%  Weight:      Height:        Wt Readings from Last 3 Encounters:  11/07/19 65.4 kg  07/30/19 63.5 kg  07/25/19 63.5 kg     Intake/Output Summary (Last 24 hours) at 11/08/2019 1843 Last data filed at 11/08/2019 1800 Gross per 24 hour  Intake 1467.11 ml  Output 150 ml  Net 1317.11 ml     Physical Exam  Gen:- Awake Alert,  In no apparent distress  HEENT:- [place].AT, No sclera icterus Nose- NG tube removed 11/08/19 Neck-Supple Neck,No JVD,.  Lungs-  CTAB , fair symmetrical air movement CV- S1, S2 normal, regular  Abd-  +ve B.Sounds, Abd Soft, No tenderness,    Extremity/Skin:- No  edema, pedal pulses present  Psych-affect is appropriate, oriented x3 Neuro-generalized weakness, no new focal deficits, no tremors   Data Review:   Micro Results Recent Results (from the past 240 hour(s))  Urine culture     Status: Abnormal (Preliminary result)   Collection Time: 11/06/19  9:32 PM   Specimen: Urine, Random  Result Value Ref Range Status   Specimen Description   Final    URINE, RANDOM Performed at [facility], [address], [place], [place] [address]    Special Requests   Final    NONE Performed at [facility], [address], [place], [place] [address]    Culture (A)  Final    >=100,000 COLONIES/mL ESCHERICHIA COLI SUSCEPTIBILITIES TO FOLLOW Performed at [facility], [place] [address] [place], [place] [address]    Report Status PENDING  Incomplete  SARS Coronavirus 2 by RT PCR (hospital order, performed in [facility] hospital lab) Nasopharyngeal Urine, Clean Catch     Status: None   Collection Time: 11/06/19 10:16 PM   Specimen: Urine, Clean Catch; Nasopharyngeal  Result Value Ref Range Status   SARS Coronavirus 2 NEGATIVE NEGATIVE Final    Comment: (NOTE) SARS-CoV-2 target nucleic acids are NOT DETECTED.  The SARS-CoV-2 RNA is generally detectable in upper and  lower respiratory specimens during the acute phase of infection. The lowest concentration of SARS-CoV-2 viral copies this assay can detect is 250 copies / mL. A negative result does not preclude SARS-CoV-2 infection and should not be used as the sole basis for treatment or other patient management decisions.  A negative result may occur with improper specimen collection / handling, submission of specimen other than nasopharyngeal swab, presence of  viral mutation(s) within the areas targeted by this assay, and inadequate number of viral copies (<250 copies / mL). A negative result must be combined with clinical observations, patient history, and epidemiological information.  Fact Sheet for Patients:   StrictlyIdeas.no  Fact Sheet for Healthcare Providers: BankingDealers.co.za  This test is not yet approved or  cleared by the [place] FDA and has been authorized for detection and/or diagnosis of SARS-CoV-2 by FDA under an Emergency Use Authorization (EUA).  This EUA will remain in effect (meaning this test can be used) for the duration of the COVID-19 declaration under Section 564(b)(1) of the Act, 21 U.S.C. section 360bbb-3(b)(1), unless the authorization is terminated or revoked sooner.  Performed at [facility], [address], [place], [place] [address]     Radiology Reports CT ABDOMEN PELVIS WO CONTRAST  Result Date: 11/06/2019 CLINICAL DATA:  Abdomen pain EXAM: CT ABDOMEN AND PELVIS WITHOUT CONTRAST TECHNIQUE: Multidetector CT imaging of the abdomen and pelvis was performed following the standard protocol without IV contrast. COMPARISON:  CT 07/25/2019, 06/22/2018 FINDINGS: Lower chest: Lung bases demonstrate no acute consolidation or pleural effusion. Coronary vascular calcification. Hepatobiliary: No focal liver abnormality is seen. No gallstones, gallbladder wall thickening, or biliary dilatation. Pancreas: Unremarkable. No  pancreatic ductal dilatation or surrounding inflammatory changes. Spleen: Normal in size without focal abnormality. Adrenals/Urinary Tract: Adrenal glands are normal. Mild atrophy right kidney. No hydronephrosis. Urinary bladder is unremarkable. Stomach/Bowel: The stomach is nonenlarged. Mildly dilated fluid-filled proximal and mid small bowel measuring up to 3.5 cm in diameter without bowel wall thickening. Transition to nondilated distal small bowel in the left lower quadrant/pelvis, consistent with bowel obstruction, likely due to adhesion. There is a small low anterior ventral hernia containing portion of dilated small bowel loops but this does not appear to be the source of obstruction. Vascular/Lymphatic: Extensive aortic atherosclerosis. No aneurysm. No suspicious adenopathy Reproductive: Uterus and bilateral adnexa are unremarkable. Other: No free air or free fluid. Musculoskeletal: Degenerative changes. No acute or suspicious osseous abnormality. IMPRESSION: 1. Findings consistent with small bowel obstruction, transition point visible in the left lower quadrant/pelvis, and felt likely due to adhesions. There is a small low ventral hernia containing a portion of dilated small bowel but this does not appear to be source of obstruction. 2. Mildly atrophic right kidney.  Negative for hydronephrosis Electronically Signed   By: Donavan Foil M.D.   On: 11/06/2019 21:35   DG Chest Portable 1 View  Result Date: 11/07/2019 CLINICAL DATA:  Post NG tube placement EXAM: PORTABLE CHEST 1 VIEW COMPARISON:  10/11/2017 FINDINGS: NG tube is in the stomach. Left base atelectasis. Right lung clear. Heart is normal size. No effusions or acute bony abnormality. IMPRESSION: NG tube in the stomach. Left base atelectasis. Electronically Signed   By: Rolm Baptise M.D.   On: 11/07/2019 01:18     CBC Recent Labs  Lab 11/06/19 1955 11/07/19 0751  WBC 17.6* 10.5  HGB 13.7 10.6*  HCT 41.8 33.6*  PLT 350 239  MCV 89.7  93.3  MCH 29.4 29.4  MCHC 32.8 31.5  RDW 12.2 12.3  LYMPHSABS 1.8  --   MONOABS 0.6  --   EOSABS 0.1  --   BASOSABS 0.0  --     Chemistries  Recent Labs  Lab 11/06/19 1955 11/07/19 0751  NA 138 144  K 4.1 3.8  CL 98 109  CO2 23 24  GLUCOSE 199* 107*  BUN 38* 30*  CREATININE 1.84* 1.46*  CALCIUM  9.7 8.0*  AST 25 17  ALT 17 12  ALKPHOS 64 49  BILITOT 0.8 0.6   ------------------------------------------------------------------------------------------------------------------ No results for input(s): CHOL, HDL, LDLCALC, TRIG, CHOLHDL, LDLDIRECT in the last 72 hours.  Lab Results  Component Value Date   HGBA1C 6.0 (H) 11/06/2019   ------------------------------------------------------------------------------------------------------------------ No results for input(s): TSH, T4TOTAL, T3FREE, THYROIDAB in the last 72 hours.  Invalid input(s): FREET3 ------------------------------------------------------------------------------------------------------------------ No results for input(s): VITAMINB12, FOLATE, FERRITIN, TIBC, IRON, RETICCTPCT in the last 72 hours.  Coagulation profile No results for input(s): INR, PROTIME in the last 168 hours.  No results for input(s): DDIMER in the last 72 hours.  Cardiac Enzymes No results for input(s): CKMB, TROPONINI, MYOGLOBIN in the last 168 hours.  Invalid input(s): CK ------------------------------------------------------------------------------------------------------------------    Component Value Date/Time   BNP 199.0 (H) 10/11/2017 8478     Roxan Hockey M.D on 11/08/2019 at 6:43 PM  Go to [email or web address] - for contact info  [facility] - Office  [phone]

## 2019-11-08 NOTE — Care Management Important Message (Signed)
Important Message  Patient Details  Name: [name] MRN: [id number] Date of Birth: Nov 15, 1923   Medicare Important Message Given:  Yes     [name] 11/08/2019, 4:21 PM

## 2019-11-09 LAB — BASIC METABOLIC PANEL
Anion gap: 9 (ref 5–15)
BUN: 14 mg/dL (ref 8–23)
CO2: 24 mmol/L (ref 22–32)
Calcium: 8.7 mg/dL — ABNORMAL LOW (ref 8.9–10.3)
Chloride: 107 mmol/L (ref 98–111)
Creatinine, Ser: 1.19 mg/dL — ABNORMAL HIGH (ref 0.44–1.00)
GFR calc Af Amer: 46 mL/min — ABNORMAL LOW (ref >60)
GFR calc non Af Amer: 39 mL/min — ABNORMAL LOW (ref >60)
Glucose, Bld: 134 mg/dL — ABNORMAL HIGH (ref 70–99)
Potassium: 3.7 mmol/L (ref 3.5–5.1)
Sodium: 140 mmol/L (ref 135–145)

## 2019-11-09 LAB — URINE CULTURE: Culture: >=100000 — AB

## 2019-11-09 LAB — CBC
HCT: 33.9 % — ABNORMAL LOW (ref 36.0–46.0)
Hemoglobin: 10.9 g/dL — ABNORMAL LOW (ref 12.0–15.0)
MCH: 29.6 pg (ref 26.0–34.0)
MCHC: 32.2 g/dL (ref 30.0–36.0)
MCV: 92.1 fL (ref 80.0–100.0)
Platelets: 220 10*3/uL (ref 150–400)
RBC: 3.68 MIL/uL — ABNORMAL LOW (ref 3.87–5.11)
RDW: 12 % (ref 11.5–15.5)
WBC: 7 10*3/uL (ref 4.0–10.5)
nRBC: 0 % (ref 0.0–0.2)

## 2019-11-09 LAB — GLUCOSE, CAPILLARY
Glucose-Capillary: 93 mg/dL (ref 70–99)
Glucose-Capillary: 108 mg/dL — ABNORMAL HIGH (ref 70–99)

## 2019-11-09 MED ORDER — CEFDINIR 300 MG PO CAPS
300.0000 mg | ORAL_CAPSULE | Freq: Two times a day (BID) | ORAL | 0 refills | Status: AC
Start: 2019-11-09 — End: 2019-11-11

## 2019-11-09 MED ORDER — DILTIAZEM HCL ER COATED BEADS 120 MG PO CP24: 120.0000 mg | ORAL_CAPSULE | Freq: Every day | ORAL | 2 refills | Status: DC

## 2019-11-09 MED ORDER — PANTOPRAZOLE SODIUM 40 MG PO TBEC: 40.0000 mg | DELAYED_RELEASE_TABLET | Freq: Every day | ORAL | 5 refills | Status: DC

## 2019-11-09 MED ORDER — ACETAMINOPHEN 325 MG PO TABS: 650.0000 mg | ORAL_TABLET | Freq: Four times a day (QID) | ORAL | 0 refills | Status: DC | PRN

## 2019-11-09 MED ORDER — ONDANSETRON HCL 4 MG PO TABS: 4.0000 mg | ORAL_TABLET | Freq: Four times a day (QID) | ORAL | 0 refills | Status: DC | PRN

## 2019-11-09 MED ORDER — CEFDINIR 300 MG PO CAPS
300.0000 mg | ORAL_CAPSULE | Freq: Once | ORAL | Status: AC
  Administered 2019-11-09: 300 mg via ORAL
  Filled 2019-11-09: qty 1

## 2019-11-09 MED ORDER — SENNOSIDES-DOCUSATE SODIUM 8.6-50 MG PO TABS: 2.0000 | ORAL_TABLET | Freq: Every day | ORAL | 1 refills | Status: AC

## 2019-11-09 MED ORDER — TORSEMIDE 20 MG PO TABS: 20.0000 mg | ORAL_TABLET | ORAL | 5 refills | Status: DC

## 2019-11-09 NOTE — TOC Transition Note (Signed)
Transition of Care [facility]) - CM/SW Discharge Note   Patient Details  Name: [name] MRN: [id number] Date of Birth: Aug 31, 1923  [facility] CM/SW Contact:  Sherald Barge, RN Phone Number: 11/09/2019, 10:53 AM   Clinical Narrative:   [facility] aware of DC home with resumption of HH. Will pull orders from chart. No further TOC needs noted at this time.     Final next level of care: [place] Barriers to Discharge: No Barriers Identified   Patient Goals and CMS Choice Patient states their goals for this hospitalization and ongoing recovery are:: feel better CMS Medicare.gov Compare Post Acute Care list provided to:: Patient Choice offered to / list presented to : Patient  Discharge Placement                       Discharge Plan and Services                          HH Arranged: RN, PT, Nurse's Aide [facility] Agency: [place] ([place]) Date [place]: 11/09/19   Representative spoke with at [place]: [place] ([place]) Interventions     Readmission Risk Interventions Readmission Risk Prevention Plan 11/09/2019  Transportation Screening Complete  PCP or Specialist Appt within 5-7 Days Complete  Home Care Screening Complete  Medication Review (RN CM) Complete  Some recent data might be hidden

## 2019-11-09 NOTE — Progress Notes (Signed)
Discharge instructions given to the pt. Education on picking up medications at pharmacy, patient verbalized understanding of instructions. Patient will be discharged when son arrives.

## 2019-11-09 NOTE — Discharge Summary (Signed)
[name], is a 86 y.o. female  DOB 1923/06/07  MRN [id number].  Admission date:  11/06/2019  Admitting Physician  Edmonia Lynch, DO  Discharge Date:  11/09/2019   Primary MD  [name], MD  Recommendations for primary care physician for things to follow:   1)Avoid ibuprofen/Advil/Aleve/Motrin/Goody Powders/Naproxen/BC powders/Meloxicam/Diclofenac/Indomethacin and other Nonsteroidal anti-inflammatory medications as these will make you more likely to bleed and can cause stomach ulcers, can also cause Kidney problems.   2)Avoid constipation  Admission Diagnosis  SBO (small bowel obstruction) ([place]) [K56.609] Impacted cerumen of left ear [H61.22] Acute cystitis without hematuria [N30.00]   Discharge Diagnosis  SBO (small bowel obstruction) ([place]) [K56.609] Impacted cerumen of left ear [H61.22] Acute cystitis without hematuria [N30.00]   Principal Problem:   SBO (small bowel obstruction) (HCC) Active Problems:   Atrial fibrillation (H[facility])   Nausea and vomiting   Major depression   Hypothyroid   Hyperglycemia      Past Medical History:  Diagnosis Date  . Abdominal pain, chronic, right lower quadrant    "ever since I had my appendix removed"  . Atrial fibrillation ([place])   . Breast cancer ([place])   . Cancer ([place])    left side  . Chronic back pain   . Edema   . GERD (gastroesophageal reflux disease)   . Hyperlipidemia   . Hypertension   . Osteopenia   . Osteoporosis   . Ovarian cyst 02/2015   left  . Recurrent abdominal pain    LLQ    Past Surgical History:  Procedure Laterality Date  . APPENDECTOMY    . APPENDECTOMY    . BREAST SURGERY Left    lumpectomy  . CARDIOVERSION N/A 03/31/2016   Procedure: CARDIOVERSION;  Surgeon: Josue Hector, MD;  Location: [facility];  Service: Cardiovascular;  Laterality: N/A;  . COLON RESECTION    . COLON SURGERY    . HERNIA REPAIR    .  HERNIA REPAIR       HPI  from the history and physical done on the day of admission:    Chief Complaint: Intractable nominal pain, nausea and vomiting  HPI: BIANA HAGGAR is a 86 y.o. female with medical history significant of hypertension, hyperlipidemia, atrial fibrillation, GERD and chronic back pain presented to ED for evaluation of intractable nausea, vomiting and abdominal pain.  Patient states that he has a history of chronic abdominal pain but this pain is totally different and started in right upper quadrant and slowly become generalized..  Patient states that she is having severe nausea with few episodes of vomiting for the last 2 days and her last bowel movement was yesterday and denying passing flatus.  Patient otherwise denies fever, chills, chest pain, shortness of breath .  ED Course: On arrival to the ED patient had blood pressure of 157/58, heart rate 85, temperature 98.6, respiratory rate 18 and oxygen saturation 96% on room air.  Blood work showed WBC 17.6, hemoglobin 13.7, sodium 138, potassium 4.1, BUN 38, creatinine  1.84 and blood glucose 199.  Covid test negative.  Chest x-ray negative.  UA positive for UTI.  Patient was given a bolus of IV normal saline in the ED, 2 doses of IV fentanyl and Zofran.  Patient was also given IV ceftriaxone for UTI.  ED physician contacted Dr. Baldo Daub surgery who recommended to place NG tube and will see the patient in the morning.  Review of Systems: As per HPI otherwise 10 point review of systems negative.   Hospital Course:     Brief Summary:- 86 y.o.femalewith medical history significant ofhypertension, hyperlipidemia, atrial fibrillation, GERD and chronic back pain admitted 11/07/19 with SBO--  A/p 1)SBO-- surgical consult from Dr. Arnoldo Morale appreciated -Recommend conservative management for now -Initially had NG tube, bowel function returned slowly, NG tube was removed, patient tolerating oral intake, passing gas and  having BMs, -No emesis  2)Chronic Atrial Fibrillation---patient previously taken off Xarelto due to bleeding over 4 months ago -PTA patient was on metoprolol and Cardizem both can be restarted  3)AKI----acute kidney injury on CKD stage -IIIb  -Worsening renal function is due to dehydration in setting of GI losses and poor oral intake compounded by lisinopril and torsemide use ---creatinine on admission=1.84, baseline creatinine = 1.4 to 1.6, creatinine is now= 1.1, renally adjust medications, avoid nephrotoxic agents / dehydration / hypotension -AKI resolved with hydration and with discontinuation of torsemide on lisinopril -PTA home medications can be restarted at this time  4)HTN--- stable, okay to restart metoprolol, Cardizem on lisinopril as well as torsemide  5)E coli  UTI--- treated with Rocephin , okay to discharge on Omnicef   Disposition--home with home health services  Disposition: The patient is from: Home  Anticipated d/c is to: Home with home health services    Code Status : full code  Family Communication:     (patient is alert, awake and coherent)  --Left voicemail for patient's son  Consults  :  Gen surgery  Discharge Condition: stable  Follow UP--PCP post discharge   Diet and Activity recommendation:  As advised  Discharge Instructions    Discharge Instructions    Call MD for:  difficulty breathing, headache or visual disturbances   Complete by: As directed    Call MD for:  persistant dizziness or light-headedness   Complete by: As directed    Call MD for:  persistant nausea and vomiting   Complete by: As directed    Call MD for:  severe uncontrolled pain   Complete by: As directed    Call MD for:  temperature >100.4   Complete by: As directed    Diet - low sodium heart healthy   Complete by: As directed    Discharge instructions   Complete by: As directed    1)Avoid ibuprofen/Advil/Aleve/Motrin/Goody  Powders/Naproxen/BC powders/Meloxicam/Diclofenac/Indomethacin and other Nonsteroidal anti-inflammatory medications as these will make you more likely to bleed and can cause stomach ulcers, can also cause Kidney problems.   2) avoid constipation   Increase activity slowly   Complete by: As directed         Discharge Medications     Allergies as of 11/09/2019      Reactions   Amiodarone Itching   Amoxicillin Hives   Cephalosporins Itching   Lipitor [atorvastatin] Other (See Comments)   Unknown:patient is not aware of this allergy   Lorazepam Other (See Comments)   Exceptional fatigue   Lovenox [enoxaparin Sodium] Nausea And Vomiting   Morphine And Related Itching   Penicillins Swelling  Sulfa Antibiotics Swelling      Medication List    TAKE these medications   ALPRAZolam 0.25 MG tablet Commonly known as: XANAX Take one tablet by mouth twice daily prn needed for anxiety or sleep What changed:   how much to take  how to take this  when to take this  reasons to take this  additional instructions   cefdinir 300 MG capsule Commonly known as: OMNICEF Take 1 capsule (300 mg total) by mouth 2 (two) times daily for 2 days.   cyanocobalamin 1000 MCG tablet Take 1 tablet (1,000 mcg total) by mouth daily.   diltiazem 120 MG 24 hr capsule Commonly known as: CARDIZEM CD Take 1 capsule (120 mg total) by mouth daily.   HYDROcodone-acetaminophen 5-325 MG tablet Commonly known as: NORCO/VICODIN TAKE 1/2-1 TABLET BY MOUTH EVERY TWELVE HOURS AS NEEDED. What changed:   how much to take  how to take this  when to take this  reasons to take this  additional instructions   levothyroxine 75 MCG tablet Commonly known as: SYNTHROID Take 1 tablet (75 mcg total) by mouth daily.   lisinopril 2.5 MG tablet Commonly known as: ZESTRIL Take 1 tablet (2.5 mg total) by mouth daily.   metoprolol succinate 50 MG 24 hr tablet Commonly known as: TOPROL-XL TAKE 1 TABLET BY  MOUTH DAILY. TAKE WITH OR IMMEDIATELY FOLLOWING A MEAL. What changed:   how much to take  how to take this  when to take this  additional instructions   multivitamin tablet Take 1 tablet by mouth daily.   ondansetron 4 MG tablet Commonly known as: ZOFRAN Take 1 tablet (4 mg total) by mouth every 6 (six) hours as needed for nausea.   pantoprazole 40 MG tablet Commonly known as: PROTONIX Take 1 tablet (40 mg total) by mouth daily.   potassium chloride 10 MEQ tablet Commonly known as: KLOR-CON Take 1 tablet (10 mEq total) by mouth daily.   QC Acetaminophen 8 Hours 650 MG CR tablet Generic drug: acetaminophen Take 1 tablet by mouth in the morning and at bedtime. What changed: Another medication with the same name was added. Make sure you understand how and when to take each.   acetaminophen 325 MG tablet Commonly known as: TYLENOL Take 2 tablets (650 mg total) by mouth every 6 (six) hours as needed for mild pain (or Fever >/= 101). What changed: You were already taking a medication with the same name, and this prescription was added. Make sure you understand how and when to take each.   senna-docusate 8.6-50 MG tablet Commonly known as: Senokot-S Take 2 tablets by mouth at bedtime.   sertraline 50 MG tablet Commonly known as: ZOLOFT Take 1 tablet (50 mg total) by mouth daily.   torsemide 20 MG tablet Commonly known as: DEMADEX Take 1 tablet (20 mg total) by mouth every Monday, Wednesday, and Friday. Start taking on: November 11, 2019 What changed:   how much to take  how to take this  when to take this  additional instructions       Major procedures and Radiology Reports - PLEASE review detailed and final reports for all details, in brief -   CT ABDOMEN PELVIS WO CONTRAST  Result Date: 11/06/2019 CLINICAL DATA:  Abdomen pain EXAM: CT ABDOMEN AND PELVIS WITHOUT CONTRAST TECHNIQUE: Multidetector CT imaging of the abdomen and pelvis was performed following the  standard protocol without IV contrast. COMPARISON:  CT 07/25/2019, 06/22/2018 FINDINGS: Lower chest: Lung bases demonstrate no  acute consolidation or pleural effusion. Coronary vascular calcification. Hepatobiliary: No focal liver abnormality is seen. No gallstones, gallbladder wall thickening, or biliary dilatation. Pancreas: Unremarkable. No pancreatic ductal dilatation or surrounding inflammatory changes. Spleen: Normal in size without focal abnormality. Adrenals/Urinary Tract: Adrenal glands are normal. Mild atrophy right kidney. No hydronephrosis. Urinary bladder is unremarkable. Stomach/Bowel: The stomach is nonenlarged. Mildly dilated fluid-filled proximal and mid small bowel measuring up to 3.5 cm in diameter without bowel wall thickening. Transition to nondilated distal small bowel in the left lower quadrant/pelvis, consistent with bowel obstruction, likely due to adhesion. There is a small low anterior ventral hernia containing portion of dilated small bowel loops but this does not appear to be the source of obstruction. Vascular/Lymphatic: Extensive aortic atherosclerosis. No aneurysm. No suspicious adenopathy Reproductive: Uterus and bilateral adnexa are unremarkable. Other: No free air or free fluid. Musculoskeletal: Degenerative changes. No acute or suspicious osseous abnormality. IMPRESSION: 1. Findings consistent with small bowel obstruction, transition point visible in the left lower quadrant/pelvis, and felt likely due to adhesions. There is a small low ventral hernia containing a portion of dilated small bowel but this does not appear to be source of obstruction. 2. Mildly atrophic right kidney.  Negative for hydronephrosis Electronically Signed   By: Donavan Foil M.D.   On: 11/06/2019 21:35   DG Chest Portable 1 View  Result Date: 11/07/2019 CLINICAL DATA:  Post NG tube placement EXAM: PORTABLE CHEST 1 VIEW COMPARISON:  10/11/2017 FINDINGS: NG tube is in the stomach. Left base atelectasis.  Right lung clear. Heart is normal size. No effusions or acute bony abnormality. IMPRESSION: NG tube in the stomach. Left base atelectasis. Electronically Signed   By: Rolm Baptise M.D.   On: 11/07/2019 01:18    Micro Results    Recent Results (from the past 240 hour(s))  Urine culture     Status: Abnormal   Collection Time: 11/06/19  9:32 PM   Specimen: Urine, Random  Result Value Ref Range Status   Specimen Description   Final    URINE, RANDOM Performed at [facility], [address], [place], [place] [address]    Special Requests   Final    NONE Performed at [facility], [address], [place], [place] [address]    Culture >=100,000 COLONIES/mL ESCHERICHIA COLI (A)  Final   Report Status 11/09/2019 FINAL  Final   Organism ID, Bacteria ESCHERICHIA COLI (A)  Final      Susceptibility   Escherichia coli - MIC*    AMPICILLIN 4 SENSITIVE Sensitive     CEFAZOLIN <=4 SENSITIVE Sensitive     CEFTRIAXONE <=0.25 SENSITIVE Sensitive     CIPROFLOXACIN <=0.25 SENSITIVE Sensitive     GENTAMICIN <=1 SENSITIVE Sensitive     IMIPENEM <=0.25 SENSITIVE Sensitive     NITROFURANTOIN <=16 SENSITIVE Sensitive     TRIMETH/SULFA <=20 SENSITIVE Sensitive     AMPICILLIN/SULBACTAM <=2 SENSITIVE Sensitive     PIP/TAZO <=4 SENSITIVE Sensitive     * >=100,000 COLONIES/mL ESCHERICHIA COLI  SARS Coronavirus 2 by RT PCR (hospital order, performed in [facility] hospital lab) Nasopharyngeal Urine, Clean Catch     Status: None   Collection Time: 11/06/19 10:16 PM   Specimen: Urine, Clean Catch; Nasopharyngeal  Result Value Ref Range Status   SARS Coronavirus 2 NEGATIVE NEGATIVE Final    Comment: (NOTE) SARS-CoV-2 target nucleic acids are NOT DETECTED.  The SARS-CoV-2 RNA is generally detectable in upper and lower respiratory specimens during the acute phase of  infection. The lowest concentration of SARS-CoV-2 viral copies this assay can detect is 250 copies / mL. A negative result does not preclude  SARS-CoV-2 infection and should not be used as the sole basis for treatment or other patient management decisions.  A negative result may occur with improper specimen collection / handling, submission of specimen other than nasopharyngeal swab, presence of viral mutation(s) within the areas targeted by this assay, and inadequate number of viral copies (<250 copies / mL). A negative result must be combined with clinical observations, patient history, and epidemiological information.  Fact Sheet for Patients:   StrictlyIdeas.no  Fact Sheet for Healthcare Providers: BankingDealers.co.za  This test is not yet approved or  cleared by the [place] FDA and has been authorized for detection and/or diagnosis of SARS-CoV-2 by FDA under an Emergency Use Authorization (EUA).  This EUA will remain in effect (meaning this test can be used) for the duration of the COVID-19 declaration under Section 564(b)(1) of the Act, 21 U.S.C. section 360bbb-3(b)(1), unless the authorization is terminated or revoked sooner.  Performed at [facility], [address], [place], [place] [address]        Today   Subjective    Eben Burow today has no new complaints No fever  Or chills  -Eating and drinking well, no emesis, passing gas and having BMs          Patient has been seen and examined prior to discharge   Objective   Blood pressure (!) 159/70, pulse 66, temperature 98.4 F (36.9 C), temperature source Oral, resp. rate 16, height 5\' 2"  (1.575 m), weight 65.4 kg, SpO2 96 %.   Intake/Output Summary (Last 24 hours) at 11/09/2019 1036 Last data filed at 11/09/2019 0900 Gross per 24 hour  Intake 1521.49 ml  Output --  Net 1521.49 ml    Exam Gen:- Awake Alert, no acute distress  HEENT:- [place].AT, No sclera icterus Neck-Supple Neck,No JVD,.  Lungs-  CTAB , good air movement bilaterally  CV- S1, S2 normal, regular Abd-  +ve B.Sounds, Abd Soft,  No tenderness,    Extremity/Skin:- No  edema,   good pulses Psych-affect is appropriate, oriented x3 Neuro-generalized weakness, no new focal deficits, no tremors    Data Review   CBC w Diff:  Lab Results  Component Value Date   WBC 7.0 11/09/2019   HGB 10.9 (L) 11/09/2019   HGB 10.7 (L) 02/25/2019   HCT 33.9 (L) 11/09/2019   HCT 32.2 (L) 02/25/2019   PLT 220 11/09/2019   PLT 291 02/25/2019   LYMPHOPCT 10 11/06/2019   MONOPCT 4 11/06/2019   EOSPCT 0 11/06/2019   BASOPCT 0 11/06/2019    CMP:  Lab Results  Component Value Date   NA 140 11/09/2019   NA 143 02/25/2019   K 3.7 11/09/2019   CL 107 11/09/2019   CO2 24 11/09/2019   BUN 14 11/09/2019   BUN 29 02/25/2019   CREATININE 1.19 (H) 11/09/2019   CREATININE 0.64 09/10/2013   PROT 6.0 (L) 11/07/2019   PROT 7.2 02/25/2019   ALBUMIN 3.3 (L) 11/07/2019   ALBUMIN 4.4 02/25/2019   BILITOT 0.6 11/07/2019   BILITOT 0.2 02/25/2019   ALKPHOS 49 11/07/2019   AST 17 11/07/2019   ALT 12 11/07/2019  .   Total Discharge time is about 33 minutes  Roxan Hockey M.D on 11/09/2019 at 10:36 AM  Go to [email or web address] -  for contact info  [facility] - Office  [phone]

## 2019-11-09 NOTE — Progress Notes (Signed)
Discharge instructions given to the pt. Education on picking up medications at pharmacy, patient verbalized understanding of instructions. Patient will be discharged when son arrives.   Nursing instructor with the student when giving discharge instructions. Concur with above charting. The patient is ready for discharge when son arrives to pick the patient up.

## 2019-11-09 NOTE — Progress Notes (Signed)
  Subjective: Patient has no abdominal pain, nausea, vomiting.  She is having multiple bowel movements.  Objective: Vital signs in last 24 hours: Temp:  [98.1 F (36.7 C)-98.3 F (36.8 C)] 98.1 F (36.7 C) (06/19 0431) Pulse Rate:  [63-72] 68 (06/19 0431) Resp:  [16-20] 16 (06/19 0431) BP: (155-177)/(54-132) 162/66 (06/19 0431) SpO2:  [93 %-97 %] 97 % (06/19 0431) Last BM Date: 11/08/19  Intake/Output from previous day: 06/18 0701 - 06/19 0700 In: 1320.8 [P.O.:50; I.V.:1270.8] Out: -  Intake/Output this shift: No intake/output data recorded.  General appearance: alert, cooperative and no distress GI: soft, non-tender; bowel sounds normal; no masses,  no organomegaly  Lab Results:  Recent Labs    11/06/19 1955 11/07/19 0751  WBC 17.6* 10.5  HGB 13.7 10.6*  HCT 41.8 33.6*  PLT 350 239   BMET Recent Labs    11/06/19 1955 11/07/19 0751  NA 138 144  K 4.1 3.8  CL 98 109  CO2 23 24  GLUCOSE 199* 107*  BUN 38* 30*  CREATININE 1.84* 1.46*  CALCIUM 9.7 8.0*   PT/INR No results for input(s): LABPROT, INR in the last 72 hours.  Studies/Results: No results found.  Anti-infectives: Anti-infectives (From admission, onward)   Start     Dose/Rate Route Frequency Ordered Stop   11/07/19 0830  cefTRIAXone (ROCEPHIN) 1 g in sodium chloride 0.9 % 100 mL IVPB     Discontinue     1 g 200 mL/hr over 30 Minutes Intravenous Every 24 hours 11/07/19 1308        Assessment/Plan: Impression: Partial small bowel obstruction, resolved Plan: Okay for discharge from surgery standpoint.  May advance diet.  LOS: 2 days    [facility]iva Signs 11/09/2019

## 2019-11-12 ENCOUNTER — Telehealth: Payer: Self-pay | Admitting: Family Medicine

## 2019-11-12 NOTE — Telephone Encounter (Signed)
Verbal approval given to [facility]

## 2019-11-12 NOTE — Telephone Encounter (Signed)
[name] with [facility] calling to resume nursing services with pt. 1 x 4 weeks   CB# [phone]

## 2019-11-12 NOTE — Telephone Encounter (Signed)
May go ahead is fine to have approval

## 2019-11-15 ENCOUNTER — Encounter: Payer: Self-pay | Admitting: Cardiology

## 2019-11-15 ENCOUNTER — Telehealth (INDEPENDENT_AMBULATORY_CARE_PROVIDER_SITE_OTHER): Payer: Medicare Other | Admitting: Cardiology

## 2019-11-15 ENCOUNTER — Other Ambulatory Visit: Payer: Self-pay

## 2019-11-15 VITALS — BP 148/80 | HR 68 | Ht 62.0 in | Wt 144.0 lb

## 2019-11-15 DIAGNOSIS — I480 Paroxysmal atrial fibrillation: Secondary | ICD-10-CM | POA: Diagnosis not present

## 2019-11-15 DIAGNOSIS — R600 Localized edema: Secondary | ICD-10-CM | POA: Diagnosis not present

## 2019-11-15 NOTE — Progress Notes (Signed)
Virtual Visit via Telephone Note   This visit type was conducted due to national recommendations for restrictions regarding the COVID-19 Pandemic (e.g. social distancing) in an effort to limit this patient's exposure and mitigate transmission in our community.  Due to her co-morbid illnesses, this patient is at least at moderate risk for complications without adequate follow up.  This format is felt to be most appropriate for this patient at this time.  The patient did not have access to video technology/had technical difficulties with video requiring transitioning to audio format only (telephone).  All issues noted in this document were discussed and addressed.  No physical exam could be performed with this format.  Please refer to the patient's chart for her  consent to telehealth for [facility].   The patient was identified using 2 identifiers.  Date:  11/15/2019   ID:  [name], DOB Apr 01, 1924, MRN [id number]  Patient Location: Home Provider Location: Office  PCP:  [name], MD  Cardiologist:  Dr Harl Bowie Electrophysiologist:  None   Evaluation Performed:  Follow-Up Visit  Chief Complaint:  Follow up  History of Present Illness:    [name] is a 86 y.o. female seen today for follow up of the following medical problems.   1. Afib/Aflutter - metoprolol makes her tired, higher dose made her more fatigued and did not improve her palpitatins.  - changed to dilttiazem due to ongoing palpitations with increased beta blocker dose and fatigue.  - due to side effects and ineffectiveness of AV nodal agents started amiodarone.  - s/p DCCV 03/2016  - pcp recently lowered dilt from 180mg  to 120mg  due to orthostatic symptoms.  - off xarelto since recent admission with GI bleed. Admit last year with small SAH.  - no recent palpitations - compliant with meds   2. LE edema -no recent swelling   3. GI bleed - admit 01/2019 with GI bleed, bright red blood  per rectum - Hgb 10.5, down from baseline 11 to 12 - she decliend colonscopy. Xarelto held  - denies any recent bleeding.    4. History of ICH/SAH 06/2017 - suggested by CT but not seen on MRI   5. SBO - admitted 10/2019 with SBO, managed conservatively - self resolved, no ongoing symptoms    SH: her sister is also a patient of mine, [employer]   The patient does not have symptoms concerning for COVID-19 infection (fever, chills, cough, or new shortness of breath).    Past Medical History:  Diagnosis Date  . Abdominal pain, chronic, right lower quadrant    "ever since I had my appendix removed"  . Atrial fibrillation ([place])   . Breast cancer ([place])   . Cancer ([place])    left side  . Chronic back pain   . Edema   . GERD (gastroesophageal reflux disease)   . Hyperlipidemia   . Hypertension   . Osteopenia   . Osteoporosis   . Ovarian cyst 02/2015   left  . Recurrent abdominal pain    LLQ   Past Surgical History:  Procedure Laterality Date  . APPENDECTOMY    . APPENDECTOMY    . BREAST SURGERY Left    lumpectomy  . CARDIOVERSION N/A 03/31/2016   Procedure: CARDIOVERSION;  Surgeon: Josue Hector, MD;  Location: [facility];  Service: Cardiovascular;  Laterality: N/A;  . COLON RESECTION    . COLON SURGERY    . HERNIA REPAIR    . HERNIA REPAIR  Current Meds  Medication Sig  . acetaminophen (TYLENOL) 325 MG tablet Take 2 tablets (650 mg total) by mouth every 6 (six) hours as needed for mild pain (or Fever >/= 101).  Marland Kitchen ALPRAZolam (XANAX) 0.25 MG tablet Take one tablet by mouth twice daily prn needed for anxiety or sleep (Patient taking differently: Take 0.25 mg by mouth 2 (two) times daily as needed for anxiety or sleep. )  . diltiazem (CARDIZEM CD) 120 MG 24 hr capsule Take 1 capsule (120 mg total) by mouth daily.  Marland Kitchen HYDROcodone-acetaminophen (NORCO/VICODIN) 5-325 MG tablet TAKE 1/2-1 TABLET BY MOUTH EVERY TWELVE HOURS AS NEEDED. (Patient taking  differently: Take 0.5-1 tablets by mouth every 12 (twelve) hours as needed for moderate pain or severe pain. )  . levothyroxine (SYNTHROID) 75 MCG tablet Take 1 tablet (75 mcg total) by mouth daily.  Marland Kitchen lisinopril (ZESTRIL) 2.5 MG tablet Take 1 tablet (2.5 mg total) by mouth daily.  . metoprolol succinate (TOPROL-XL) 50 MG 24 hr tablet TAKE 1 TABLET BY MOUTH DAILY. TAKE WITH OR IMMEDIATELY FOLLOWING A MEAL. (Patient taking differently: Take 50 mg by mouth daily. TAKE WITH OR IMMEDIATELY FOLLOWING A MEAL.)  . Multiple Vitamin (MULTIVITAMIN) tablet Take 1 tablet by mouth daily.  . ondansetron (ZOFRAN) 4 MG tablet Take 1 tablet (4 mg total) by mouth every 6 (six) hours as needed for nausea.  . pantoprazole (PROTONIX) 40 MG tablet Take 1 tablet (40 mg total) by mouth daily.  . potassium chloride (KLOR-CON) 10 MEQ tablet Take 1 tablet (10 mEq total) by mouth daily.  . QC ACETAMINOPHEN 8 HOURS 650 MG CR tablet Take 1 tablet by mouth in the morning and at bedtime.  . senna-docusate (SENOKOT-S) 8.6-50 MG tablet Take 2 tablets by mouth at bedtime.  . sertraline (ZOLOFT) 50 MG tablet Take 1 tablet (50 mg total) by mouth daily.  Marland Kitchen torsemide (DEMADEX) 20 MG tablet Take 1 tablet (20 mg total) by mouth every Monday, Wednesday, and Friday.  . vitamin B-12 1000 MCG tablet Take 1 tablet (1,000 mcg total) by mouth daily.     Allergies:   Amiodarone, Amoxicillin, Cephalosporins, Lipitor [atorvastatin], Lorazepam, Lovenox [enoxaparin sodium], Morphine and related, Penicillins, and Sulfa antibiotics   Social History   Tobacco Use  . Smoking status: Never Smoker  . Smokeless tobacco: Never Used  Vaping Use  . Vaping Use: Never used  Substance Use Topics  . Alcohol use: No    Alcohol/week: 0.0 standard drinks  . Drug use: No     Family Hx: The patient's family history includes Arthritis in an other family member; Cancer in an other family member; Diabetes in her mother; Heart disease in an other family  member; Hyperlipidemia in her brother.  ROS:   Please see the history of present illness.     All other systems reviewed and are negative.   Prior CV studies:   The following studies were reviewed today:    Labs/Other Tests and Data Reviewed:    EKG:  No ECG reviewed.  Recent Labs: 11/07/2019: ALT 12 11/09/2019: BUN 14; Creatinine, Ser 1.19; Hemoglobin 10.9; Platelets 220; Potassium 3.7; Sodium 140   Recent Lipid Panel Lab Results  Component Value Date/Time   CHOL 185 07/18/2017 03:57 AM   TRIG 139 07/18/2017 03:57 AM   HDL 72 07/18/2017 03:57 AM   CHOLHDL 2.6 07/18/2017 03:57 AM   LDLCALC 85 07/18/2017 03:57 AM    Wt Readings from Last 3 Encounters:  11/15/19 144 lb (65.3 kg)  11/07/19 144 lb 2.9 oz (65.4 kg)  07/30/19 140 lb (63.5 kg)     Objective:    Vital Signs:  BP (!) 148/80   Pulse 68   Ht 5\' 2"  (1.575 m)   Wt 144 lb (65.3 kg)   BMI 26.34 kg/m    Normal affect. Normal speech pattern and tone. Comfortable, no apparent distress. No audible signs of sob or wheezing.   ASSESSMENT & PLAN:    1. Afib/aflutter - off anticoag due to prior [facility] as well as GI bleeding - no symptoms, continue current meds   2. LE Edema -doing well, continue diuretic.    COVID-19 Education: The signs and symptoms of COVID-19 were discussed with the patient and how to seek care for testing (follow up with PCP or arrange E-visit).  The importance of social distancing was discussed today.  Time:   Today, I have spent 15 minutes with the patient with telehealth technology discussing the above problems.     Medication Adjustments/Labs and Tests Ordered: Current medicines are reviewed at length with the patient today.  Concerns regarding medicines are outlined above.   Tests Ordered: No orders of the defined types were placed in this encounter.   Medication Changes: No orders of the defined types were placed in this encounter.   Follow Up:  Either In Person or Virtual  in 6 month(s)  Signed, Carlyle Dolly, MD  11/15/2019 12:40 PM    [place]

## 2019-11-15 NOTE — Patient Instructions (Signed)
Medication Instructions:  Your physician recommends that you continue on your current medications as directed. Please refer to the Current Medication list given to you today.  *If you need a refill on your cardiac medications before your next appointment, please call your pharmacy*   Lab Work: None ordered  If you have labs (blood work) drawn today and your tests are completely normal, you will receive your results only by:  [place] (if you have MyChart) OR  A paper copy in the mail If you have any lab test that is abnormal or we need to change your treatment, we will call you to review the results.   Testing/Procedures: None ordered   Follow-Up: At [facility], you and your health needs are our priority.  As part of our continuing mission to provide you with exceptional heart care, we have created designated Provider Care Teams.  These Care Teams include your primary Cardiologist (physician) and Advanced Practice Providers (APPs -  Physician Assistants and Nurse Practitioners) who all work together to provide you with the care you need, when you need it.  We recommend signing up for the patient portal called "MyChart".  Sign up information is provided on this After Visit Summary.  MyChart is used to connect with patients for Virtual Visits (Telemedicine).  Patients are able to view lab/test results, encounter notes, upcoming appointments, etc.  Non-urgent messages can be sent to your provider as well.   To learn more about what you can do with MyChart, go to [email or web address].    Your next appointment:   6 month(s)  The format for your next appointment:   Either In Person or Virtual  Provider:   Carlyle Dolly, MD   Other Instructions

## 2019-11-17 ENCOUNTER — Telehealth: Payer: Self-pay | Admitting: Family Medicine

## 2019-11-17 NOTE — Telephone Encounter (Signed)
Patient will be due for a home visit toward the last part of August Please connect with patient and/or her granddaughter Jeannetta Nap Essentially put on the schedule for 4:10 PM last week of August-last 10 days while this would be fine-preferably not a Monday or one of my half days

## 2019-11-18 ENCOUNTER — Telehealth: Payer: Self-pay | Admitting: Family Medicine

## 2019-11-18 NOTE — Telephone Encounter (Signed)
Lmtc. I don't see Dr. Nicki [name] mentioning ct scan anywhere and it doesn't look like one has been ordered.

## 2019-11-18 NOTE — Telephone Encounter (Signed)
Pt recently had CT scan through an ER visit, pt's granddaughter wonders if pt needs the CT that Dr. Nicki Reaper ordered?  Please advise & call Jeannetta Nap [phone]

## 2019-11-19 NOTE — Telephone Encounter (Signed)
Left another message for pt to return call

## 2019-11-23 DIAGNOSIS — I129 Hypertensive chronic kidney disease with stage 1 through stage 4 chronic kidney disease, or unspecified chronic kidney disease: Secondary | ICD-10-CM | POA: Diagnosis not present

## 2019-11-23 DIAGNOSIS — M545 Low back pain: Secondary | ICD-10-CM | POA: Diagnosis not present

## 2019-11-23 DIAGNOSIS — F329 Major depressive disorder, single episode, unspecified: Secondary | ICD-10-CM | POA: Diagnosis not present

## 2019-11-23 DIAGNOSIS — B9620 Unspecified Escherichia coli [E. coli] as the cause of diseases classified elsewhere: Secondary | ICD-10-CM | POA: Diagnosis not present

## 2019-11-23 DIAGNOSIS — M6281 Muscle weakness (generalized): Secondary | ICD-10-CM | POA: Diagnosis not present

## 2019-11-23 DIAGNOSIS — M170 Bilateral primary osteoarthritis of knee: Secondary | ICD-10-CM | POA: Diagnosis not present

## 2019-11-23 DIAGNOSIS — R109 Unspecified abdominal pain: Secondary | ICD-10-CM | POA: Diagnosis not present

## 2019-11-23 DIAGNOSIS — M8580 Other specified disorders of bone density and structure, unspecified site: Secondary | ICD-10-CM | POA: Diagnosis not present

## 2019-11-23 DIAGNOSIS — H6122 Impacted cerumen, left ear: Secondary | ICD-10-CM | POA: Diagnosis not present

## 2019-11-23 DIAGNOSIS — K219 Gastro-esophageal reflux disease without esophagitis: Secondary | ICD-10-CM | POA: Diagnosis not present

## 2019-11-23 DIAGNOSIS — M810 Age-related osteoporosis without current pathological fracture: Secondary | ICD-10-CM | POA: Diagnosis not present

## 2019-11-23 DIAGNOSIS — N1832 Chronic kidney disease, stage 3b: Secondary | ICD-10-CM | POA: Diagnosis not present

## 2019-11-23 DIAGNOSIS — I482 Chronic atrial fibrillation, unspecified: Secondary | ICD-10-CM | POA: Diagnosis not present

## 2019-11-23 DIAGNOSIS — E785 Hyperlipidemia, unspecified: Secondary | ICD-10-CM | POA: Diagnosis not present

## 2019-11-23 DIAGNOSIS — N3000 Acute cystitis without hematuria: Secondary | ICD-10-CM | POA: Diagnosis not present

## 2019-11-23 DIAGNOSIS — G8929 Other chronic pain: Secondary | ICD-10-CM | POA: Diagnosis not present

## 2019-11-27 NOTE — Telephone Encounter (Signed)
Left message for granddaughter to return call

## 2019-11-28 NOTE — Telephone Encounter (Signed)
Assuming granddaughter will contact our office if this is a continued issue for them. Left several messages before completing.

## 2019-12-05 ENCOUNTER — Telehealth: Payer: Self-pay | Admitting: Family Medicine

## 2019-12-05 NOTE — Telephone Encounter (Signed)
May have right hip xray  Result to be forwarded to [place] as soon as it is read

## 2019-12-05 NOTE — Telephone Encounter (Signed)
[name]-[facility] -states patient is having right hip pain  And requesting x-ray. [name]

## 2019-12-05 NOTE — Telephone Encounter (Signed)
Please advise. Thank you

## 2019-12-06 NOTE — Telephone Encounter (Signed)
Called [facility] because number in message does not work. And spoke with Maudie Mercury and she states it will probably be Monday before they can get out to do xray and will send results when they are read.

## 2019-12-07 DIAGNOSIS — M25551 Pain in right hip: Secondary | ICD-10-CM | POA: Diagnosis not present

## 2019-12-09 ENCOUNTER — Other Ambulatory Visit: Payer: Self-pay | Admitting: Family Medicine

## 2019-12-16 ENCOUNTER — Telehealth: Payer: Self-pay | Admitting: Family Medicine

## 2019-12-16 DIAGNOSIS — M25559 Pain in unspecified hip: Secondary | ICD-10-CM

## 2019-12-16 NOTE — Telephone Encounter (Signed)
Patient is daughter would like results of x-ray ordered through [facility], The results are in your box for review

## 2019-12-18 NOTE — Telephone Encounter (Signed)
X-ray shows degenerative changes no fracture.  It should be noted that portable x-rays are slightly limited regarding their detail because of them being portable but the best that can be told based on the x-ray is that there is no fracture If ongoing troubles recommend follow-up

## 2019-12-18 NOTE — Telephone Encounter (Signed)
Results discussed with [name]-grandaughter(DPR) per Dr Nicki Reaper: [place] shows degenerative changes no fracture.  It should be noted that portable x-rays are slightly limited regarding their detail because of them being portable but the best that can be told based on the x-ray is that there is no fracture If ongoing troubles recommend follow-up  Granddaughter verbalized understanding and granddaughter wants to know what they need to do from here- recent hospitalization with a scan- does she need a new scan- in a lot of pain still

## 2019-12-18 NOTE — Telephone Encounter (Signed)
Where is her pain currently? Hip?  Back? When does this pain occurr? Anything that makes it worse? Anything make it better? When did the pain began? Any fall or injury?

## 2019-12-19 NOTE — Telephone Encounter (Signed)
Left message to return call 

## 2019-12-20 NOTE — Telephone Encounter (Signed)
Patient requesting orthopedic referral for [place] or [facility]

## 2019-12-20 NOTE — Telephone Encounter (Signed)
No falls or injuries that she is aware of.

## 2019-12-20 NOTE — Telephone Encounter (Signed)
Patient states pain started about a year or so ago and is getting worse. Pain is in her right hip and low back, pain is pretty much always there but worse with movement. States once she sits down and gets comfortable the pain will ease off.

## 2019-12-20 NOTE — Telephone Encounter (Signed)
Ortho referral Dr. Aline Brochure

## 2019-12-20 NOTE — Telephone Encounter (Signed)
More likely this is an impingement of the nerve in the back or potentially arthritic changes or degenerative changes in the back or the hip  Certainly we are sympathetic to this It would be reasonable to do orthopedic consultation If the family feels they can bring the patient to orthopedics we can set up the referral here in [place] or in [place] or [place] Please let us know how they would like to proceed

## 2019-12-26 ENCOUNTER — Encounter: Payer: Self-pay | Admitting: Family Medicine

## 2020-01-07 ENCOUNTER — Telehealth: Payer: Self-pay | Admitting: Family Medicine

## 2020-01-07 DIAGNOSIS — M1990 Unspecified osteoarthritis, unspecified site: Secondary | ICD-10-CM

## 2020-01-07 DIAGNOSIS — R270 Ataxia, unspecified: Secondary | ICD-10-CM

## 2020-01-07 DIAGNOSIS — R531 Weakness: Secondary | ICD-10-CM

## 2020-01-07 NOTE — Telephone Encounter (Signed)
So I am all for her having an assistant to help her but I would recommend that you talk with [facility] to see if this is something they can provide?  Also aging disability services may have some assistance in this matter  If you need something written and signed I will be happy to do so

## 2020-01-07 NOTE — Telephone Encounter (Signed)
[name] is calling for patient who is requesting home health aid to come in to help her with daily living like bathing ,fixing her meals,etc. [name] states we can contact patient when this is setup.

## 2020-01-08 NOTE — Telephone Encounter (Signed)
Called [facility] and was told that Wells Guiles would give me a call back

## 2020-01-09 NOTE — Telephone Encounter (Signed)
So #1.  Patient will or will not qualify for home health aide #2 in order to see if she would qualify from a home health aide we would have to consult home health #3 reason for consultation home health ataxia leg weakness arthritis  I cannot guarantee if home health will be able to come or get a aide  Aging disability and transit services may have a list of aids that can be paid for, or possibly the patient would qualify for an aide through them-so if family is more interested in that they can talk with them  If they are more interested in doing the home health consultation go ahead with that

## 2020-01-09 NOTE — Telephone Encounter (Signed)
Discussed with pt's son and number given to him for aging, disability, and transit in [place] ([phone]). Referral put in for home health. Son Richardson Landry verbalized understanding

## 2020-01-09 NOTE — Telephone Encounter (Signed)
[name] at [employer] stated they are just a management service thru the insurance and the Dr would have to order a home health aide for the patient thru a participating home health agency if appropriate.

## 2020-01-13 ENCOUNTER — Other Ambulatory Visit: Payer: Self-pay | Admitting: Family Medicine

## 2020-01-20 ENCOUNTER — Ambulatory Visit (INDEPENDENT_AMBULATORY_CARE_PROVIDER_SITE_OTHER): Payer: Medicare Other | Admitting: Orthopedic Surgery

## 2020-01-20 ENCOUNTER — Other Ambulatory Visit: Payer: Self-pay | Admitting: Family Medicine

## 2020-01-20 ENCOUNTER — Ambulatory Visit: Payer: Medicare Other

## 2020-01-20 ENCOUNTER — Other Ambulatory Visit: Payer: Self-pay

## 2020-01-20 VITALS — BP 132/76 | HR 72 | Ht 64.0 in | Wt 140.0 lb

## 2020-01-20 DIAGNOSIS — G8929 Other chronic pain: Secondary | ICD-10-CM | POA: Diagnosis not present

## 2020-01-20 DIAGNOSIS — M5441 Lumbago with sciatica, right side: Secondary | ICD-10-CM | POA: Diagnosis not present

## 2020-01-20 DIAGNOSIS — M5136 Other intervertebral disc degeneration, lumbar region: Secondary | ICD-10-CM | POA: Diagnosis not present

## 2020-01-20 MED ORDER — METHOCARBAMOL 500 MG PO TABS: 500.0000 mg | ORAL_TABLET | Freq: Three times a day (TID) | ORAL | 1 refills | Status: DC

## 2020-01-20 MED ORDER — PREDNISONE 10 MG (48) PO TBPK
ORAL_TABLET | Freq: Every day | ORAL | 0 refills | Status: DC
Start: 2020-01-20 — End: 2020-03-23

## 2020-01-20 MED ORDER — MELOXICAM 7.5 MG PO TABS
7.5000 mg | ORAL_TABLET | Freq: Every day | ORAL | 5 refills | Status: DC
Start: 2020-01-20 — End: 2020-03-23

## 2020-01-20 NOTE — Progress Notes (Signed)
NEW PROBLEM//OFFICE VISIT  Chief Complaint  Patient presents with  . Back Pain    Bcak pain    86 year old female presents with a 2-year history of back pain worse in the last 8 months associated with bilateral leg pain and inability to sleep at night.  She has not been evaluated as far as I can tell by the epic notes.  She says she has been to several doctors nobody can figure out what she had other than to tell her that 2 bones are rubbing together and nothing else was wrong  She is on hydrocodone 5 mg but says it does not help her pain  Pain is in the lower back she has bilateral leg pain as well  She has a history of atrial fibrillation not on any blood thinners at present   Review of Systems  Cardiovascular: Positive for palpitations.  Musculoskeletal: Positive for back pain, joint pain and neck pain.  All other systems reviewed and are negative.    Past Medical History:  Diagnosis Date  . Abdominal pain, chronic, right lower quadrant    "ever since I had my appendix removed"  . Atrial fibrillation ([place])   . Breast cancer ([place])   . Cancer ([place])    left side  . Chronic back pain   . Edema   . GERD (gastroesophageal reflux disease)   . Hyperlipidemia   . Hypertension   . Osteopenia   . Osteoporosis   . Ovarian cyst 02/2015   left  . Recurrent abdominal pain    LLQ    Past Surgical History:  Procedure Laterality Date  . APPENDECTOMY    . APPENDECTOMY    . BREAST SURGERY Left    lumpectomy  . CARDIOVERSION N/A 03/31/2016   Procedure: CARDIOVERSION;  Surgeon: Josue Hector, MD;  Location: [facility];  Service: Cardiovascular;  Laterality: N/A;  . COLON RESECTION    . COLON SURGERY    . HERNIA REPAIR    . HERNIA REPAIR      Family History  Problem Relation Age of Onset  . Diabetes Mother   . Heart disease Other   . Arthritis Other   . Cancer Other   . Hyperlipidemia Brother    Social History   Tobacco Use  . Smoking status: Never Smoker  . Smokeless  tobacco: Never Used  Vaping Use  . Vaping Use: Never used  Substance Use Topics  . Alcohol use: No    Alcohol/week: 0.0 standard drinks  . Drug use: No    Allergies  Allergen Reactions  . Amiodarone Itching  . Amoxicillin Hives  . Cephalosporins Itching  . Lipitor [Atorvastatin] Other (See Comments)    Unknown:patient is not aware of this allergy  . Lorazepam Other (See Comments)    Exceptional fatigue   . Lovenox [Enoxaparin Sodium] Nausea And Vomiting  . Morphine And Related Itching  . Penicillins Swelling       . Sulfa Antibiotics Swelling    Current Meds  Medication Sig  . acetaminophen (TYLENOL) 325 MG tablet Take 2 tablets (650 mg total) by mouth every 6 (six) hours as needed for mild pain (or Fever >/= 101).  Marland Kitchen ALPRAZolam (XANAX) 0.25 MG tablet Take one tablet by mouth twice daily prn needed for anxiety or sleep (Patient taking differently: Take 0.25 mg by mouth 2 (two) times daily as needed for anxiety or sleep. )  . diltiazem (CARDIZEM CD) 120 MG 24 hr capsule Take 1 capsule (120 mg  total) by mouth daily.  Marland Kitchen HYDROcodone-acetaminophen (NORCO/VICODIN) 5-325 MG tablet TAKE 1/2-1 TABLET BY MOUTH EVERY TWELVE HOURS AS NEEDED.  Marland Kitchen levothyroxine (SYNTHROID) 75 MCG tablet Take 1 tablet (75 mcg total) by mouth daily.  Marland Kitchen lisinopril (ZESTRIL) 2.5 MG tablet Take 1 tablet (2.5 mg total) by mouth daily.  . metoprolol succinate (TOPROL-XL) 50 MG 24 hr tablet TAKE 1 TABLET BY MOUTH DAILY. TAKE WITH OR IMMEDIATELY FOLLOWING A MEAL. (Patient taking differently: Take 50 mg by mouth daily. TAKE WITH OR IMMEDIATELY FOLLOWING A MEAL.)  . Multiple Vitamin (MULTIVITAMIN) tablet Take 1 tablet by mouth daily.  . ondansetron (ZOFRAN) 4 MG tablet Take 1 tablet (4 mg total) by mouth every 6 (six) hours as needed for nausea.  . pantoprazole (PROTONIX) 40 MG tablet Take 1 tablet (40 mg total) by mouth daily.  . potassium chloride (KLOR-CON) 10 MEQ tablet Take 1 tablet (10 mEq total) by mouth daily.   . QC ACETAMINOPHEN 8 HOURS 650 MG CR tablet Take 1 tablet by mouth in the morning and at bedtime.  . senna-docusate (SENOKOT-S) 8.6-50 MG tablet Take 2 tablets by mouth at bedtime.  . sertraline (ZOLOFT) 50 MG tablet Take 1 tablet (50 mg total) by mouth daily.  . vitamin B-12 1000 MCG tablet Take 1 tablet (1,000 mcg total) by mouth daily.    BP 132/76   Pulse 72   Ht 5\' 4"  (1.626 m)   Wt 140 lb (63.5 kg)   BMI 24.03 kg/m   Physical Exam Constitutional:      General: She is not in acute distress.    Appearance: She is well-developed.  Cardiovascular:     Comments: No peripheral edema Skin:    General: Skin is warm and dry.  Neurological:     Mental Status: She is alert and oriented to person, place, and time.     Sensory: No sensory deficit.     Coordination: Coordination normal.     Gait: Gait abnormal.     Deep Tendon Reflexes: Reflexes are normal and symmetric.     Ortho Exam  Normal strength in both legs all muscles tenderness anterolateral compartments unclear if it is from edema from venous stasis disease or L5 root irritation  Reflexes 1-2+ knee 0 ankle bilaterally  Straight leg raise is positive on the right at 30 degrees in the seated position negative on the left  The back is tender in the midline and across the lower portions on both sides right worse than left   MEDICAL DECISION MAKING  A.  Encounter Diagnoses  Name Primary?  . Chronic right-sided low back pain with right-sided sciatica   . DDD (degenerative disc disease), lumbar Yes    B. DATA ANALYSED:   IMAGING: Interpretation of images: Internal images show degenerative disc disease especially at L 5 S1 with facet arthritis lower segments   C. MANAGEMENT   Stop the hydrocodone the risk-benefit ratio was not worth taking medication at an opioid and not working  Meds ordered this encounter  Medications  . predniSONE (STERAPRED UNI-PAK 48 TAB) 10 MG (48) TBPK tablet    Sig: Take by mouth  daily.    Dispense:  48 tablet    Refill:  0  . methocarbamol (ROBAXIN) 500 MG tablet    Sig: Take 1 tablet (500 mg total) by mouth 3 (three) times daily.    Dispense:  60 tablet    Refill:  1  . meloxicam (MOBIC) 7.5 MG tablet  Sig: Take 1 tablet (7.5 mg total) by mouth daily. Start after prednisone    Dispense:  30 tablet    Refill:  5    Follow-up 2 months  Arther Abbott, MD  01/20/2020 11:57 AM

## 2020-01-21 ENCOUNTER — Ambulatory Visit: Payer: Medicare Other | Admitting: Family Medicine

## 2020-01-22 ENCOUNTER — Other Ambulatory Visit: Payer: Self-pay

## 2020-01-22 ENCOUNTER — Telehealth: Payer: Medicare Other | Admitting: Family Medicine

## 2020-01-23 ENCOUNTER — Telehealth: Payer: Self-pay | Admitting: Family Medicine

## 2020-01-23 NOTE — Telephone Encounter (Signed)
Please go ahead and give

## 2020-01-23 NOTE — Telephone Encounter (Signed)
[name] with [facility] needs verbal orders skilled nursing 1x 4 week , home health aid  2x 3week and Medical Social work [place]- [phone]

## 2020-01-23 NOTE — Telephone Encounter (Signed)
Attempted to contact [facility], but number on message is wrong number. Will call [facility] and give orders.

## 2020-01-23 NOTE — Telephone Encounter (Signed)
Please advise. Thank you

## 2020-01-23 NOTE — Telephone Encounter (Signed)
[facility] contacted- Larene Beach number is [phone]. Contacted [facility] and gave verbal orders. Larene Beach verbalized understanding.

## 2020-02-05 DIAGNOSIS — I10 Essential (primary) hypertension: Secondary | ICD-10-CM | POA: Diagnosis not present

## 2020-02-05 DIAGNOSIS — M5431 Sciatica, right side: Secondary | ICD-10-CM | POA: Diagnosis not present

## 2020-02-05 DIAGNOSIS — R270 Ataxia, unspecified: Secondary | ICD-10-CM | POA: Diagnosis not present

## 2020-02-18 ENCOUNTER — Other Ambulatory Visit: Payer: Self-pay | Admitting: Family Medicine

## 2020-02-18 NOTE — Telephone Encounter (Signed)
5 months refill please

## 2020-02-19 ENCOUNTER — Other Ambulatory Visit: Payer: Self-pay | Admitting: Family Medicine

## 2020-02-28 ENCOUNTER — Encounter: Payer: Self-pay | Admitting: Family Medicine

## 2020-02-28 ENCOUNTER — Telehealth: Payer: Self-pay | Admitting: *Deleted

## 2020-02-28 DIAGNOSIS — M549 Dorsalgia, unspecified: Secondary | ICD-10-CM

## 2020-02-28 NOTE — Telephone Encounter (Signed)
[name] [facility]) contacted and verbalized understanding. Pt would like referral. Referral placed to [facility].

## 2020-02-28 NOTE — Telephone Encounter (Signed)
So in this type of situation I would recommend [facility].  They have some orthopedic specialist that work with back pain as well as they will do injections.  Certainly injections can take the edge off the pain and the discomfort but may not necessarily take all of the discomfort away.  If they are interested in the referral please refer to [facility] due to back pain

## 2020-02-28 NOTE — Telephone Encounter (Signed)
Granddaughter(DPR) calls and wonders if the patient would be a canidate for a steroid injection in her back to help with her back pain and if so who is recommended.

## 2020-02-29 ENCOUNTER — Other Ambulatory Visit: Payer: Self-pay | Admitting: Family Medicine

## 2020-02-29 ENCOUNTER — Other Ambulatory Visit (INDEPENDENT_AMBULATORY_CARE_PROVIDER_SITE_OTHER): Payer: Medicare Other

## 2020-02-29 DIAGNOSIS — Z23 Encounter for immunization: Secondary | ICD-10-CM | POA: Diagnosis not present

## 2020-03-06 ENCOUNTER — Telehealth: Payer: Self-pay

## 2020-03-06 NOTE — Telephone Encounter (Signed)
No need for call back:  Patient just wanted Dr. Nicki Reaper to know that she has an appt at [facility] on Oct. 28th for her back.  She inquired about how she could get her scans and I told her she could get her son or her grand-daughter to get them from [facility] for her.  I also mailed her a small calendar with [facility]'s information, address and Doctor's names on it so she would have it prior to her appt.  She is aware where the office is located, in the same building as the "[employer]".

## 2020-03-11 IMAGING — US US EXTREM LOW VENOUS BILAT
1 series · 13 of 24 positions shown · non-contrast
Comparison: None.

CLINICAL DATA: [AGE] female with bilateral lower extremity
edema



[Series 1: us extrem low venous bilat · 0.08mm/px · 13 of 65 slices shown]
[im 1/65]
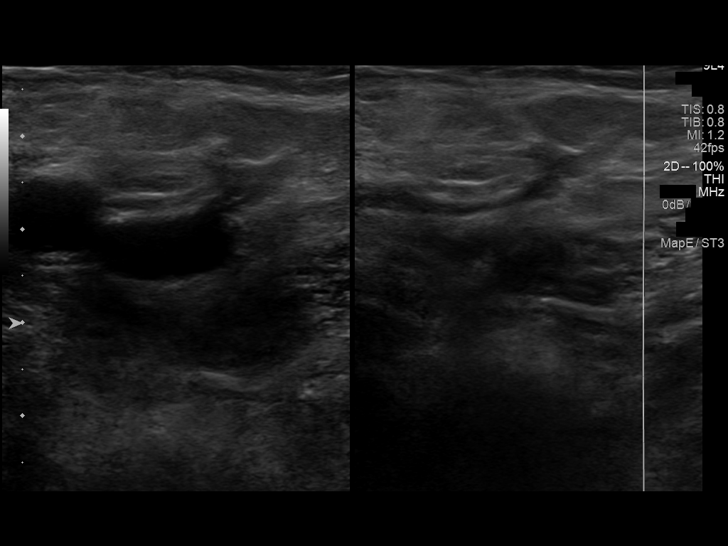
[im 6/65]
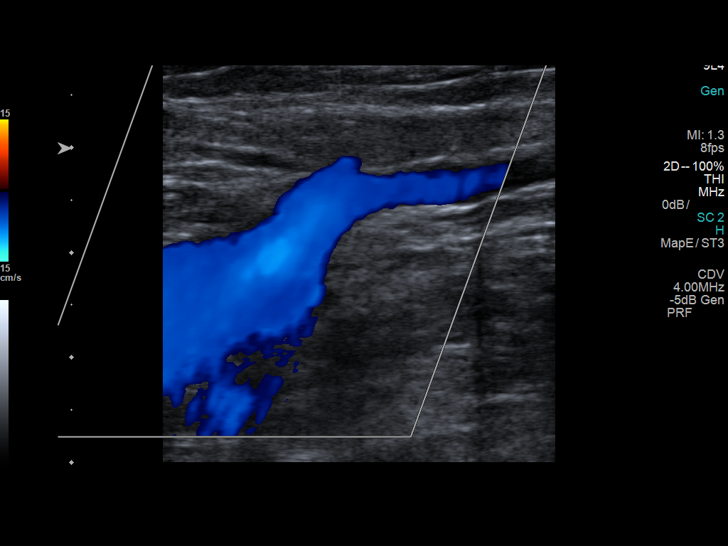
[im 12/65]
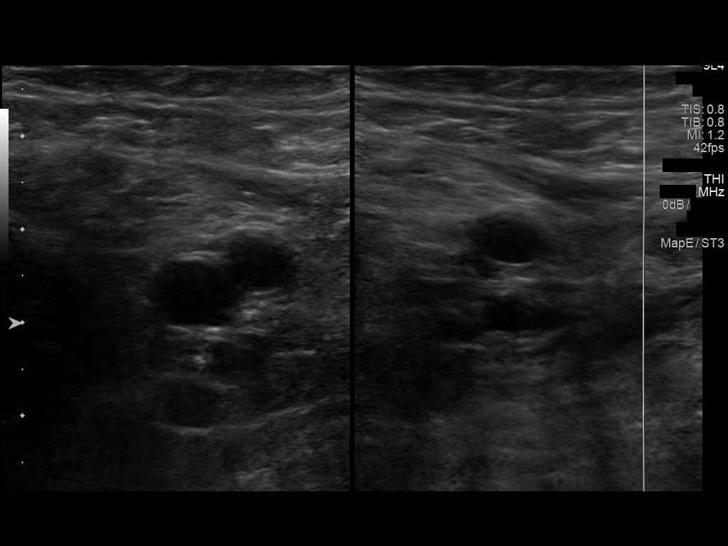
[im 17/65]
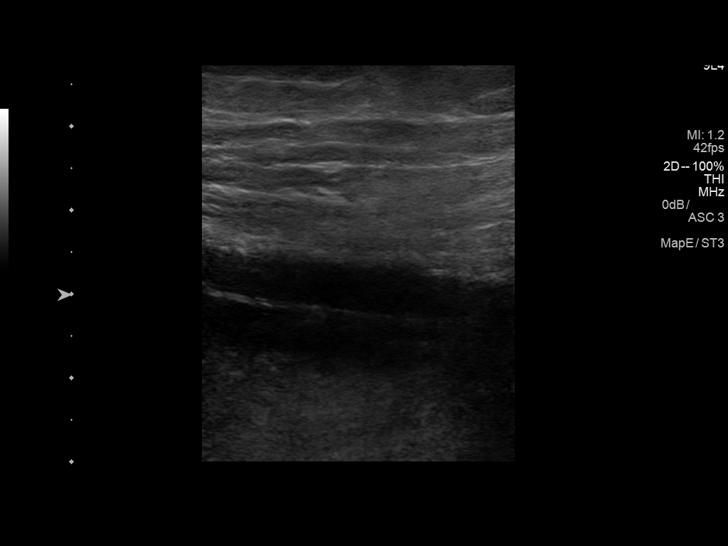
[im 23/65]
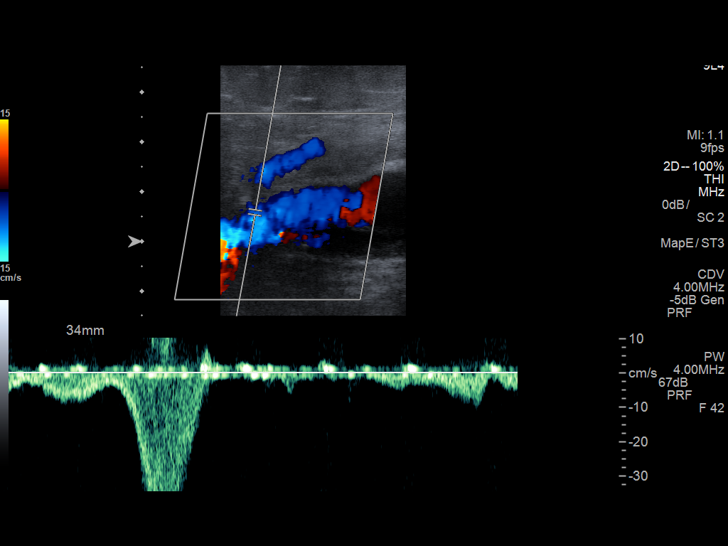
[im 28/65]
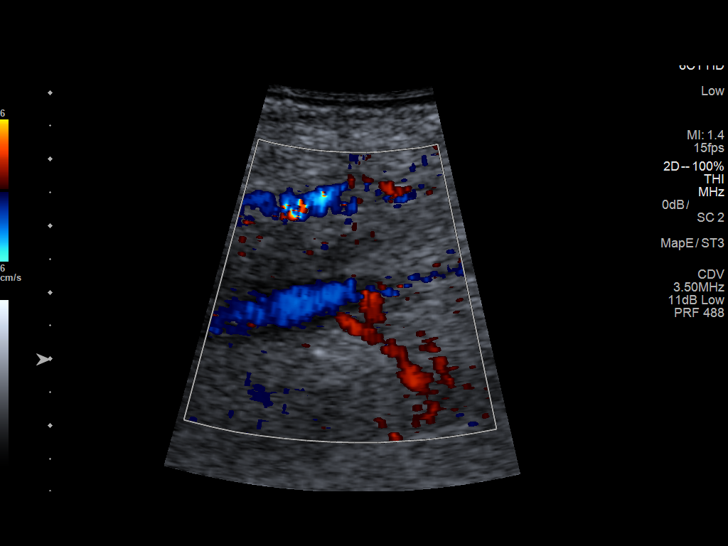
[im 34/65]
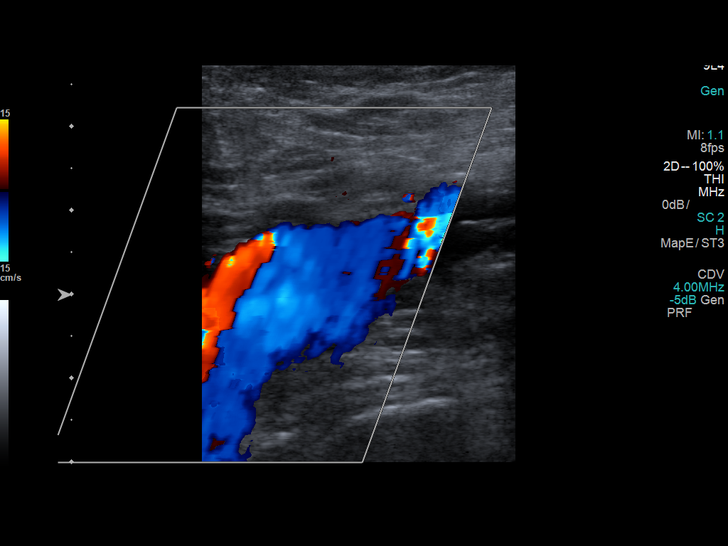
[im 37/65]
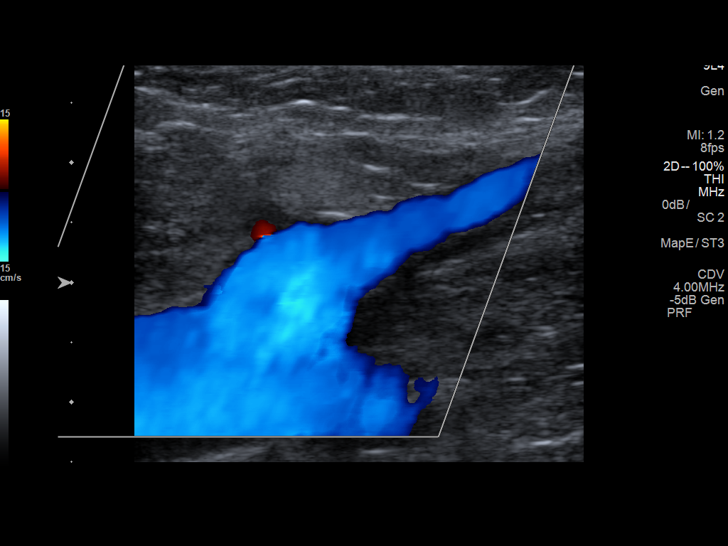
[im 42/65]
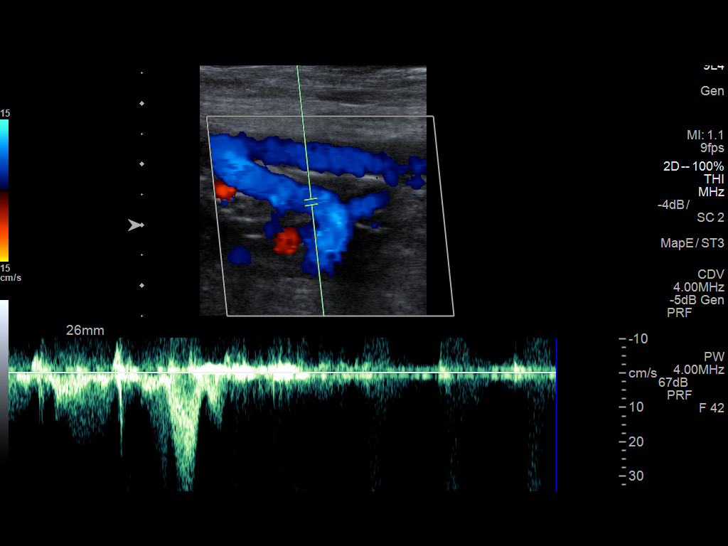
[im 48/65]
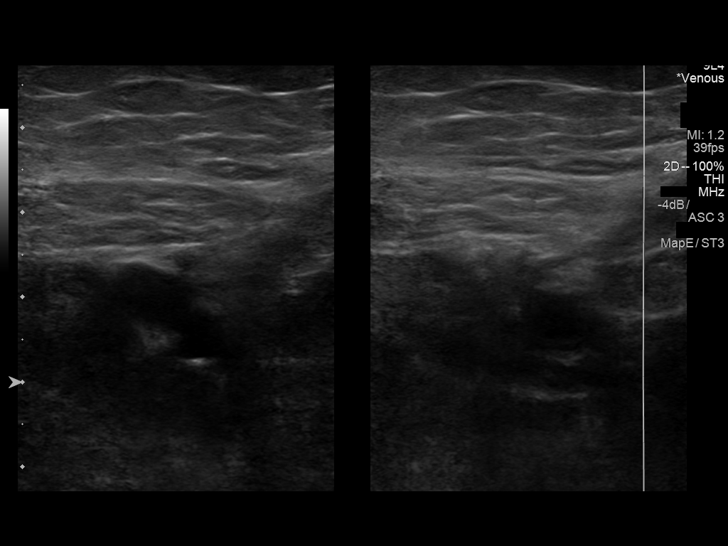
[im 53/65]
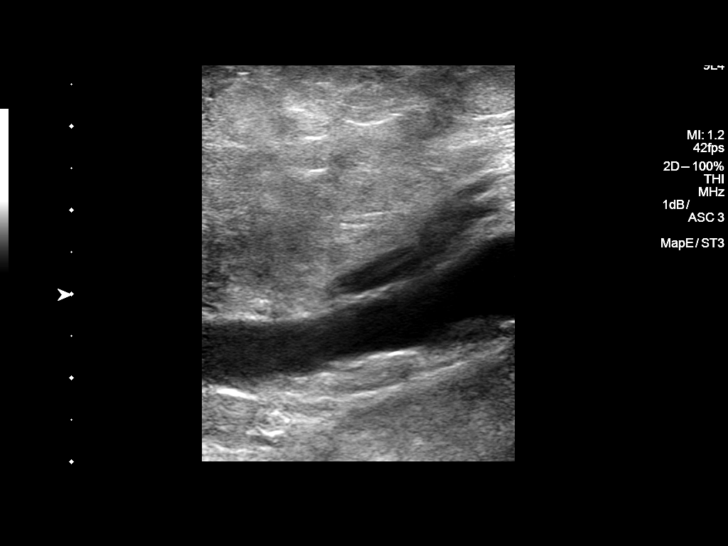
[im 59/65]
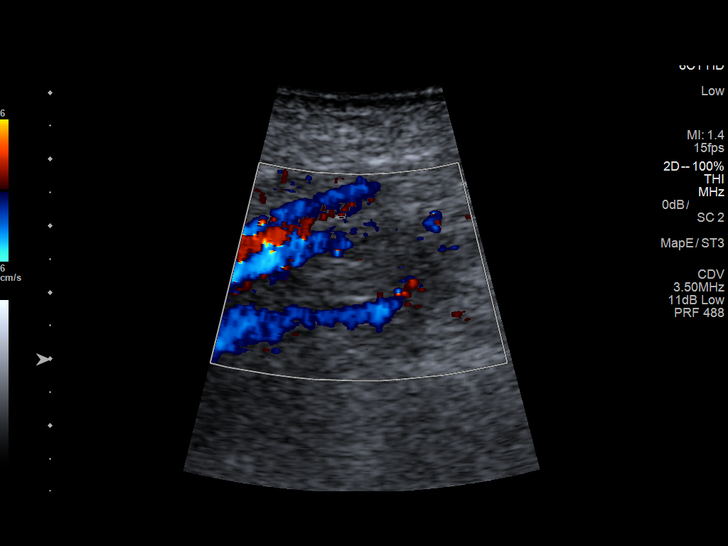
[im 65/65]
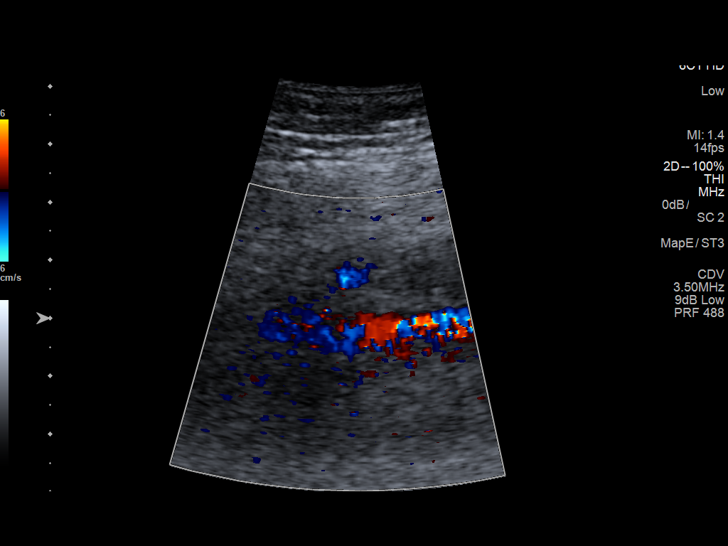

[13 of 24 positions shown; findings below may reference images not displayed]

FINDINGS: RIGHT LOWER EXTREMITY

Common Femoral Vein: No evidence of thrombus. Normal
compressibility, respiratory phasicity and response to augmentation.

Saphenofemoral Junction: No evidence of thrombus. Normal
compressibility and flow on color Doppler imaging.

Profunda Femoral Vein: No evidence of thrombus. Normal
compressibility and flow on color Doppler imaging.

Femoral Vein: No evidence of thrombus. Normal compressibility,
respiratory phasicity and response to augmentation.

Popliteal Vein: No evidence of thrombus. Normal compressibility,
respiratory phasicity and response to augmentation.

Calf Veins: No evidence of thrombus. Normal compressibility and flow
on color Doppler imaging.

Superficial Great Saphenous Vein: No evidence of thrombus. Normal
compressibility.

Venous Reflux:  None.

Other Findings:  None.

LEFT LOWER EXTREMITY

Common Femoral Vein: No evidence of thrombus. Normal
compressibility, respiratory phasicity and response to augmentation.

Saphenofemoral Junction: No evidence of thrombus. Normal
compressibility and flow on color Doppler imaging.

Profunda Femoral Vein: No evidence of thrombus. Normal
compressibility and flow on color Doppler imaging.

Femoral Vein: No evidence of thrombus. Normal compressibility,
respiratory phasicity and response to augmentation.

Popliteal Vein: No evidence of thrombus. Normal compressibility,
respiratory phasicity and response to augmentation.

Calf Veins: No evidence of thrombus. Normal compressibility and flow
on color Doppler imaging.

Superficial Great Saphenous Vein: No evidence of thrombus. Normal
compressibility.

Venous Reflux:  None.

Other Findings:  None.
IMPRESSION: No evidence of deep venous thrombosis.

## 2020-03-19 DIAGNOSIS — M545 Low back pain, unspecified: Secondary | ICD-10-CM | POA: Diagnosis not present

## 2020-03-23 ENCOUNTER — Ambulatory Visit (INDEPENDENT_AMBULATORY_CARE_PROVIDER_SITE_OTHER): Payer: Medicare Other | Admitting: Orthopedic Surgery

## 2020-03-23 ENCOUNTER — Encounter: Payer: Self-pay | Admitting: Orthopedic Surgery

## 2020-03-23 ENCOUNTER — Other Ambulatory Visit: Payer: Self-pay

## 2020-03-23 VITALS — BP 152/68 | HR 60 | Ht 64.0 in

## 2020-03-23 DIAGNOSIS — G8929 Other chronic pain: Secondary | ICD-10-CM

## 2020-03-23 DIAGNOSIS — M5441 Lumbago with sciatica, right side: Secondary | ICD-10-CM | POA: Diagnosis not present

## 2020-03-23 DIAGNOSIS — M5136 Other intervertebral disc degeneration, lumbar region: Secondary | ICD-10-CM | POA: Diagnosis not present

## 2020-03-23 NOTE — Addendum Note (Signed)
Addended by: Dessie Coma on: 03/23/2020 11:46 AM   Modules accepted: Orders

## 2020-03-23 NOTE — Patient Instructions (Addendum)
Epidural Steroid Injection  An epidural steroid injection is a shot of steroid medicine and numbing medicine that is given into the space between the spinal cord and the bones of the back (epidural space). The shot helps relieve pain caused by an irritated or swollen nerve root. The amount of pain relief you get from the injection depends on what is causing the nerve to be swollen and irritated, and how long your pain lasts. You are more likely to benefit from this injection if your pain is strong and comes on suddenly rather than if you have had long-term (chronic) pain. Tell a health care provider about:  Any allergies you have.  All medicines you are taking, including vitamins, herbs, eye drops, creams, and over-the-counter medicines.  Any problems you or family members have had with anesthetic medicines.  Any blood disorders you have.  Any surgeries you have had.  Any medical conditions you have.  Whether you are pregnant or may be pregnant. What are the risks? Generally, this is a safe procedure. However, problems may occur, including:  Headache.  Bleeding.  Infection.  Allergic reaction to medicines.  Nerve damage. What happens before the procedure? Staying hydrated Follow instructions from your health care provider about hydration, which may include:  Up to 2 hours before the procedure - you may continue to drink clear liquids, such as water, clear fruit juice, black coffee, and plain tea. Eating and drinking restrictions Follow instructions from your health care provider about eating and drinking, which may include:  8 hours before the procedure - stop eating heavy meals or foods, such as meat, fried foods, or fatty foods.  6 hours before the procedure - stop eating light meals or foods, such as toast or cereal.  6 hours before the procedure - stop drinking milk or drinks that contain milk.  2 hours before the procedure - stop drinking clear  liquids. Medicines  You may be given medicines to lower anxiety.  Ask your health care provider about: ? Changing or stopping your regular medicines. This is especially important if you are taking diabetes medicines or blood thinners. ? Taking medicines such as aspirin and ibuprofen. These medicines can thin your blood. Do not take these medicines unless your health care provider tells you to take them. ? Taking over-the-counter medicines, vitamins, herbs, and supplements.  Ask your health care provider what steps will be taken to prevent infection. General instructions  Plan to have someone take you home from the hospital or clinic.  If you will be going home right after the procedure, plan to have someone with you for 24 hours. What happens during the procedure?  An IV will be inserted into one of your veins.  You will be given one or more of the following: ? A medicine to help you relax (sedative). ? A medicine to numb the area (local anesthetic).  You will be asked to lie on your abdomen or sit.  The injection site will be cleaned.  A needle will be inserted through your skin into the epidural space. This may cause you some discomfort. An X-ray machine will be used to guide the needle as close as possible to the affected nerve.  A steroid medicine and a local anesthetic will be injected into the epidural space.  The needle and IV will be removed.  A bandage (dressing) will be put over the injection site. The procedure may vary among health care providers and hospitals. What can I expect after the  procedure? Follow these instructions at home: Injection site care  You may remove the bandage (dressing) after 24 hours.  Check your injection site every day for signs of infection. Check for: ? Redness, swelling, or pain. ? Fluid or blood. ? Warmth. ? Pus or a bad smell. Managing pain, stiffness, and swelling  For 24 hours after the procedure: ? Avoid using heat on the  injection site. ? Do not take baths, swim, or use a hot tub until your health care provider approves. Ask your health care provider if you may take a shower. You may only be allowed to take sponge baths.  If directed, put ice on the injection site. To do this: ? Put ice in a plastic bag. ? Place a towel between your skin and the bag. ? Leave the ice on for 20 minutes, 2-3 times a day.  Activity  Do not drive for 24 hours if you were given a sedative during your procedure.  Return to your normal activities as told by your health care provider. Ask your health care provider what activities are safe for you. General instructions  Your blood pressure, heart rate, breathing rate, and blood oxygen level will be monitored until you leave the hospital or clinic.  Your arm or leg may feel weak or numb for a few hours.  The injection site may feel sore.  Take over-the-counter and prescription medicines only as told by your health care provider.  Drink enough fluid to keep your urine pale yellow.  Keep all follow-up visits as told by your health care provider. This is important. Contact a health care provider if:  You have any of these signs of infection: ? Redness, swelling, or pain around your injection site. ? Fluid or blood coming from your injection site. ? Warmth coming from your injection site. ? Pus or a bad smell coming from your injection site. ? A fever.  You continue to have pain and soreness around the injection site, even after taking over-the-counter pain medicine.  You have severe, sudden, or lasting nausea or vomiting. Get help right away if:  You have severe pain at the injection site that is not relieved by medicines.  You develop a severe headache or a stiff neck.  You become sensitive to light.  You have any new numbness or weakness in your legs or arms.  You lose control of your bladder or bowel movements.  You have trouble breathing. Summary  An  epidural steroid injection is a shot of steroid medicine and numbing medicine that is given into the epidural space.  The shot helps relieve pain caused by an irritated or swollen nerve root.  You are more likely to benefit from this injection if your pain is strong and comes on suddenly rather than if you have had chronic pain. This information is not intended to replace advice given to you by your health care provider. Make sure you discuss any questions you have with your health care provider. Document Revised: 11/19/2018 Document Reviewed: 11/19/2018 Elsevier Patient Education  2020 [facility] [phone].

## 2020-03-23 NOTE — Progress Notes (Signed)
Chief Complaint  Patient presents with  . Back Pain    still having some pain in lower back   86 year old female presented  with a 2-year history of back pain worse in the last 8 months associated with bilateral leg pain and inability to sleep at night.  She has not been evaluated as far as I can tell by the epic notes.  She says she has been to several doctors nobody can figure out what she had other than to tell her that 2 bones are rubbing together and nothing else was wrong   She is on hydrocodone 5 mg but says it does not help her pain   Pain is in the lower back she has bilateral leg pain as well   She has a history of atrial fibrillation not on any blood thinners at present A.      Encounter Diagnoses  Name Primary?  . Chronic right-sided low back pain with right-sided sciatica    . DDD (degenerative disc disease), lumbar Yes      B. DATA ANALYSED:   IMAGING: Interpretation of images: Internal images show degenerative disc disease especially at L 5 S1 with facet arthritis lower segments    C. MANAGEMENT    Stop the hydrocodone the risk-benefit ratio was not worth taking medication at an opioid and not working       Meds ordered this encounter  Medications  . predniSONE (STERAPRED UNI-PAK 48 TAB) 10 MG (48) TBPK tablet      Sig: Take by mouth daily.      Dispense:  48 tablet      Refill:  0  . methocarbamol (ROBAXIN) 500 MG tablet      Sig: Take 1 tablet (500 mg total) by mouth 3 (three) times daily.      Dispense:  60 tablet      Refill:  1  . meloxicam (MOBIC) 7.5 MG tablet      Sig: Take 1 tablet (7.5 mg total) by mouth daily. Start after prednisone      Dispense:  30 tablet      Refill:  5    Options at this time include epidural injection after MRI   Chronic pain management

## 2020-04-09 ENCOUNTER — Other Ambulatory Visit: Payer: Self-pay

## 2020-04-09 ENCOUNTER — Ambulatory Visit (HOSPITAL_COMMUNITY)
Admission: RE | Admit: 2020-04-09 | Discharge: 2020-04-09 | Disposition: A | Discharge status: Home / Self Care | Payer: Medicare Other | Source: Ambulatory Visit | Attending: Orthopedic Surgery | Admitting: Orthopedic Surgery

## 2020-04-09 DIAGNOSIS — G8929 Other chronic pain: Secondary | ICD-10-CM

## 2020-04-09 DIAGNOSIS — M5441 Lumbago with sciatica, right side: Secondary | ICD-10-CM | POA: Insufficient documentation

## 2020-04-09 DIAGNOSIS — M5136 Other intervertebral disc degeneration, lumbar region: Secondary | ICD-10-CM | POA: Insufficient documentation

## 2020-04-09 DIAGNOSIS — M545 Low back pain, unspecified: Secondary | ICD-10-CM | POA: Diagnosis not present

## 2020-04-20 ENCOUNTER — Other Ambulatory Visit: Payer: Self-pay | Admitting: Family Medicine

## 2020-04-27 ENCOUNTER — Ambulatory Visit (INDEPENDENT_AMBULATORY_CARE_PROVIDER_SITE_OTHER): Payer: Medicare Other | Admitting: Orthopedic Surgery

## 2020-04-27 ENCOUNTER — Other Ambulatory Visit: Payer: Self-pay

## 2020-04-27 ENCOUNTER — Encounter: Payer: Self-pay | Admitting: Orthopedic Surgery

## 2020-04-27 VITALS — BP 138/60 | HR 69 | Ht 63.0 in | Wt 145.0 lb

## 2020-04-27 DIAGNOSIS — G8929 Other chronic pain: Secondary | ICD-10-CM | POA: Diagnosis not present

## 2020-04-27 DIAGNOSIS — M5136 Other intervertebral disc degeneration, lumbar region: Secondary | ICD-10-CM | POA: Diagnosis not present

## 2020-04-27 DIAGNOSIS — M5441 Lumbago with sciatica, right side: Secondary | ICD-10-CM | POA: Diagnosis not present

## 2020-04-27 NOTE — Progress Notes (Signed)
Chief Complaint  Patient presents with  . Back Pain    R/ still hurting    Encounter Diagnoses  Name Primary?  . DDD (degenerative disc disease), lumbar Yes  . Chronic right-sided low back pain with right-sided sciatica     Follow-up regarding MRI for back pain   MRI report as follows   T12- L1: Disc narrowing and bulging with degenerative facet spurring asymmetric to the right. Prominent right foraminal impingement. Mild spinal stenosis  L1-L2: Degenerative facet spurring. Disc narrowing and bulging. Moderate to advanced spinal stenosis. Moderate right foraminal impingement  L2-L3: Facet osteoarthritis with spurring and ligamentum flavum thickening. Circumferential disc bulging. Moderate spinal stenosis.  L3-L4: Facet osteoarthritis with spurring and anterolisthesis. The disc is narrowed mild rightward bulging. Mild right foraminal stenosis. Mild to moderate spinal stenosis.  L4-L5: Disc narrowing and endplate degeneration with bulging and ridging. Degenerative facet spurring greater on the left. Moderate or advanced left foraminal impingement. Mild left subarticular recess stenosis  L5-S1:Non segmented.  No impingement  IMPRESSION: 1. 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 T12 is non rib-bearing. 2. Multilevel disc and facet degeneration with L3-4 anterolisthesis. 3. L1-2 moderate to advanced spinal stenosis. L2-3 moderate spinal stenosis. 4. Right foraminal impingement at T12-L1 and L1-2. 5. Left foraminal impingement at L4-5.   Electronically Signed   By: Monte Fantasia M.D.   On: 04/10/2020 09:23   Discussed with patient she is 63 she is probably not a surgical candidate.  This is not married expertise.  I can set her up for epidural injections to see if she improves but otherwise I do not have much else to offer her I also offered a referral to a spine specialist for treatment  She decided to

## 2020-04-28 ENCOUNTER — Other Ambulatory Visit: Payer: Self-pay | Admitting: Family Medicine

## 2020-04-28 ENCOUNTER — Telehealth: Payer: Self-pay

## 2020-04-28 ENCOUNTER — Encounter: Payer: Self-pay | Admitting: Family Medicine

## 2020-04-28 MED ORDER — HYDROCODONE-ACETAMINOPHEN 5-325 MG PO TABS
ORAL_TABLET | ORAL | 0 refills | Status: DC
Start: 2020-04-28 — End: 2020-04-28

## 2020-04-28 MED ORDER — HYDROCODONE-ACETAMINOPHEN 5-325 MG PO TABS
1.0000 | ORAL_TABLET | Freq: Four times a day (QID) | ORAL | 0 refills | Status: DC | PRN
Start: 2020-04-28 — End: 2020-06-02

## 2020-04-28 NOTE — Telephone Encounter (Signed)
Med is pended and ready to sign

## 2020-04-28 NOTE — Telephone Encounter (Signed)
Last visit 10/16/19

## 2020-04-28 NOTE — Telephone Encounter (Signed)
Done. thanks

## 2020-04-28 NOTE — Telephone Encounter (Signed)
Pt.notified

## 2020-04-28 NOTE — Telephone Encounter (Signed)
Patient is requesting refill on hydrocodone for backpain called in [place]

## 2020-04-28 NOTE — Telephone Encounter (Signed)
Please call pt and and schedule a virtual visit within the next 4 weeks with dr Nicki Reaper. thanks

## 2020-04-28 NOTE — Telephone Encounter (Signed)
May pend a refill for 10 tablets it is already on the med list  Also patient to do follow-up visit with [place] the next 4 weeks this can be virtual

## 2020-04-28 NOTE — Telephone Encounter (Signed)
Med pended. Please send back after signing so I can let pt know and get her scheduled for an appt

## 2020-04-28 NOTE — Telephone Encounter (Signed)
Medication was signed for and sent to [employer] [facility] thank you

## 2020-05-08 ENCOUNTER — Other Ambulatory Visit: Payer: Self-pay

## 2020-05-08 ENCOUNTER — Encounter: Payer: Self-pay | Admitting: Cardiology

## 2020-05-08 ENCOUNTER — Telehealth (INDEPENDENT_AMBULATORY_CARE_PROVIDER_SITE_OTHER): Payer: Medicare Other | Admitting: Cardiology

## 2020-05-08 VITALS — Ht 63.0 in | Wt 140.0 lb

## 2020-05-08 DIAGNOSIS — I480 Paroxysmal atrial fibrillation: Secondary | ICD-10-CM | POA: Diagnosis not present

## 2020-05-08 DIAGNOSIS — R600 Localized edema: Secondary | ICD-10-CM | POA: Diagnosis not present

## 2020-05-08 NOTE — Patient Instructions (Signed)
Medication Instructions:  Your physician recommends that you continue on your current medications as directed. Please refer to the Current Medication list given to you today.  *If you need a refill on your cardiac medications before your next appointment, please call your pharmacy*   Lab Work: NONE   If you have labs (blood work) drawn today and your tests are completely normal, you will receive your results only by: . MyChart Message (if you have MyChart) OR . A paper copy in the mail If you have any lab test that is abnormal or we need to change your treatment, we will call you to review the results.   Testing/Procedures: NONE    Follow-Up: At [facility], you and your health needs are our priority.  As part of our continuing mission to provide you with exceptional heart care, we have created designated Provider Care Teams.  These Care Teams include your primary Cardiologist (physician) and Advanced Practice Providers (APPs -  Physician Assistants and Nurse Practitioners) who all work together to provide you with the care you need, when you need it.  We recommend signing up for the patient portal called "MyChart".  Sign up information is provided on this After Visit Summary.  MyChart is used to connect with patients for Virtual Visits (Telemedicine).  Patients are able to view lab/test results, encounter notes, upcoming appointments, etc.  Non-urgent messages can be sent to your provider as well.   To learn more about what you can do with MyChart, go to [email or web address].    Your next appointment:   6 month(s)  The format for your next appointment:   In Person  Provider:   Jonathan Branch, MD   Other Instructions Thank you for choosing [facility]!    

## 2020-05-08 NOTE — Progress Notes (Signed)
Virtual Visit via Telephone Note   This visit type was conducted due to national recommendations for restrictions regarding the COVID-19 Pandemic (e.g. social distancing) in an effort to limit this patient's exposure and mitigate transmission in our community.  Due to her co-morbid illnesses, this patient is at least at moderate risk for complications without adequate follow up.  This format is felt to be most appropriate for this patient at this time.  The patient did not have access to video technology/had technical difficulties with video requiring transitioning to audio format only (telephone).  All issues noted in this document were discussed and addressed.  No physical exam could be performed with this format.  Please refer to the patient's chart for her  consent to telehealth for [facility]    Date:  05/08/2020   ID:  [name], DOB Mar 30, 1924, MRN [id number] The patient was identified using 2 identifiers.  Patient Location: Home Provider Location: Office/Clinic  PCP:  [name], MD  Cardiologist: Dr Harl Bowie Electrophysiologist:  None   Evaluation Performed:  Follow-Up Visit  Chief Complaint:  Follow up visit  History of Present Illness:    DOMINIC RHOME is a 86 y.o. female seen today for follow up of the following medical problems.   1. Afib/Aflutter - metoprolol makes her tired, higher dose made her more fatigued and did not improve her palpitatins.  - changed to dilttiazem due to ongoing palpitations with increased beta blocker dose and fatigue.  - due to side effects and ineffectiveness of AV nodal agents started amiodarone.  - s/p DCCV 03/2016  - pcp recently lowered dilt from 180mg  to 120mg  due to orthostatic symptoms.  - off xarelto since recent admission with GI bleed. Admit last year with small SAH.  - no recent palpitations - compliant with meds    2. LE edema -denies any recent issues   3. GI bleed - admit 01/2019 with GI  bleed, bright red blood per rectum - Hgb 10.5, down from baseline 11 to 12 - she decliend colonscopy. Xarelto held  - denies any recent bleeding.   4. History of ICH/SAH 06/2017 - suggested by CT but not seen on MRI   5. SBO - admitted 10/2019 with SBO, managed conservatively - self resolved    SH: her sister is also a patient of mine, [employer] Has had covid vaccine x2   The patient does not have symptoms concerning for COVID-19 infection (fever, chills, cough, or new shortness of breath).    Past Medical History:  Diagnosis Date  . Abdominal pain, chronic, right lower quadrant    "ever since I had my appendix removed"  . Atrial fibrillation ([place])   . Breast cancer ([place])   . Cancer ([place])    left side  . Chronic back pain   . Edema   . GERD (gastroesophageal reflux disease)   . Hyperlipidemia   . Hypertension   . Osteopenia   . Osteoporosis   . Ovarian cyst 02/2015   left  . Recurrent abdominal pain    LLQ   Past Surgical History:  Procedure Laterality Date  . APPENDECTOMY    . APPENDECTOMY    . BREAST SURGERY Left    lumpectomy  . CARDIOVERSION N/A 03/31/2016   Procedure: CARDIOVERSION;  Surgeon: Josue Hector, MD;  Location: [facility];  Service: Cardiovascular;  Laterality: N/A;  . COLON RESECTION    . COLON SURGERY    . HERNIA REPAIR    .  HERNIA REPAIR       No outpatient medications have been marked as taking for the 05/08/20 encounter (Appointment) with Arnoldo Lenis, MD.     Allergies:   Amiodarone, Cephalosporins, Meloxicam, Robaxin [methocarbamol], Amoxicillin, Lovenox [enoxaparin sodium], Morphine and related, Penicillins, Sulfa antibiotics, Lipitor [atorvastatin], and Lorazepam   Social History   Tobacco Use  . Smoking status: Never Smoker  . Smokeless tobacco: Never Used  Vaping Use  . Vaping Use: Never used  Substance Use Topics  . Alcohol use: No    Alcohol/week: 0.0 standard drinks  . Drug use: No     Family  Hx: The patient's family history includes Arthritis in an other family member; Cancer in an other family member; Diabetes in her mother; Heart disease in an other family member; Hyperlipidemia in her brother.  ROS:   Please see the history of present illness.     All other systems reviewed and are negative.   Prior CV studies:   The following studies were reviewed today:    Labs/Other Tests and Data Reviewed:    EKG:  No ECG reviewed.  Recent Labs: 11/07/2019: ALT 12 11/09/2019: BUN 14; Creatinine, Ser 1.19; Hemoglobin 10.9; Platelets 220; Potassium 3.7; Sodium 140   Recent Lipid Panel Lab Results  Component Value Date/Time   CHOL 185 07/18/2017 03:57 AM   TRIG 139 07/18/2017 03:57 AM   HDL 72 07/18/2017 03:57 AM   CHOLHDL 2.6 07/18/2017 03:57 AM   LDLCALC 85 07/18/2017 03:57 AM    Wt Readings from Last 3 Encounters:  04/27/20 145 lb (65.8 kg)  01/20/20 140 lb (63.5 kg)  11/15/19 144 lb (65.3 kg)     Risk Assessment/Calculations:      Objective:    Vital Signs:   Today's Vitals   05/08/20 1228  Weight: 140 lb (63.5 kg)  Height: 5\' 3"  (1.6 m)   Body mass index is 24.8 kg/m.  Normal affect. Normal speech pattern and tone. Comfortable, no apparent distress. No audible signs of sob or wheezing.   ASSESSMENT & PLAN:    1. Afib/aflutter - off anticoag due to prior [facility] as well as GI bleeding - no recent palpitations, continue current meds  2. LE edema - controlled, continue diuretic         COVID-19 Education: The signs and symptoms of COVID-19 were discussed with the patient and how to seek care for testing (follow up with PCP or arrange E-visit). The importance of social distancing was discussed today.  Time:   Today, I have spent 15 minutes with the patient with telehealth technology discussing the above problems.     Medication Adjustments/Labs and Tests Ordered: Current medicines are reviewed at length with the patient today.  Concerns regarding  medicines are outlined above.   Tests Ordered: No orders of the defined types were placed in this encounter.   Medication Changes: No orders of the defined types were placed in this encounter.   Follow Up:  Virtual Visit  in 6 month(s)  Signed, Carlyle Dolly, MD  05/08/2020 12:01 PM    [place]

## 2020-05-18 ENCOUNTER — Other Ambulatory Visit: Payer: Self-pay | Admitting: Family Medicine

## 2020-05-26 ENCOUNTER — Other Ambulatory Visit: Payer: Self-pay | Admitting: *Deleted

## 2020-05-26 MED ORDER — LISINOPRIL 2.5 MG PO TABS: 2.5000 mg | ORAL_TABLET | Freq: Every day | ORAL | 1 refills | Status: DC

## 2020-05-27 ENCOUNTER — Telehealth: Payer: Self-pay | Admitting: Family Medicine

## 2020-05-27 ENCOUNTER — Other Ambulatory Visit: Payer: Self-pay

## 2020-05-27 ENCOUNTER — Telehealth (INDEPENDENT_AMBULATORY_CARE_PROVIDER_SITE_OTHER): Payer: Medicare Other | Admitting: Family Medicine

## 2020-05-27 DIAGNOSIS — R112 Nausea with vomiting, unspecified: Secondary | ICD-10-CM | POA: Diagnosis not present

## 2020-05-27 DIAGNOSIS — M25551 Pain in right hip: Secondary | ICD-10-CM

## 2020-05-27 DIAGNOSIS — E038 Other specified hypothyroidism: Secondary | ICD-10-CM

## 2020-05-27 DIAGNOSIS — I4811 Longstanding persistent atrial fibrillation: Secondary | ICD-10-CM

## 2020-05-27 MED ORDER — METOPROLOL SUCCINATE ER 50 MG PO TB24: ORAL_TABLET | ORAL | 5 refills | Status: DC

## 2020-05-27 MED ORDER — TORSEMIDE 20 MG PO TABS
ORAL_TABLET | ORAL | 5 refills | Status: DC
Start: 2020-05-27 — End: 2020-09-08

## 2020-05-27 MED ORDER — POTASSIUM CHLORIDE ER 10 MEQ PO TBCR: 10.0000 meq | EXTENDED_RELEASE_TABLET | Freq: Every day | ORAL | 5 refills | Status: DC

## 2020-05-27 MED ORDER — SERTRALINE HCL 50 MG PO TABS: 50.0000 mg | ORAL_TABLET | Freq: Every day | ORAL | 5 refills | Status: DC

## 2020-05-27 MED ORDER — LEVOTHYROXINE SODIUM 75 MCG PO TABS: 75.0000 ug | ORAL_TABLET | Freq: Every day | ORAL | 6 refills | Status: DC

## 2020-05-27 MED ORDER — PANTOPRAZOLE SODIUM 40 MG PO TBEC: 40.0000 mg | DELAYED_RELEASE_TABLET | Freq: Every day | ORAL | 5 refills | Status: DC

## 2020-05-27 MED ORDER — DILTIAZEM HCL ER COATED BEADS 120 MG PO CP24: ORAL_CAPSULE | ORAL | 5 refills | Status: DC

## 2020-05-27 NOTE — Telephone Encounter (Signed)
[name], dreyfuss are scheduled for a virtual visit with your provider today.    Just as we do with appointments in the office, we must obtain your consent to participate.  Your consent will be active for this visit and any virtual visit you may have with one of our providers in the next 365 days.    If you have a MyChart account, I can also send a copy of this consent to you electronically.  All virtual visits are billed to your insurance company just like a traditional visit in the office.  As this is a virtual visit, video technology does not allow for your provider to perform a traditional examination.  This may limit your provider's ability to fully assess your condition.  If your provider identifies any concerns that need to be evaluated in person or the need to arrange testing such as labs, EKG, etc, we will make arrangements to do so.    Although advances in technology are sophisticated, we cannot ensure that it will always work on either your end or our end.  If the connection with a video visit is poor, we may have to switch to a telephone visit.  With either a video or telephone visit, we are not always able to ensure that we have a secure connection.   I need to obtain your verbal consent now.   Are you willing to proceed with your visit today?   [name] has provided verbal consent on 05/27/2020 for a virtual visit (video or telephone).   Marlowe Shores, LPN 12/22/7076  6:75 PM

## 2020-05-27 NOTE — Progress Notes (Signed)
   Subjective:    Patient ID: [name], female    DOB: 23-Apr-1924, 87 y.o.   MRN: [id number] Very nice patient-phone visit unable to do video HPI  She has a history of small bowel obstruction she states intermittently abdominal pain nausea and vomiting over the past 3 to 4 days nausea is fading vomiting has gone away.  Able to tolerate some mild foods and liquids Pt having 4 week follow up. Pt states last Monday she began having stomach pain and vomiting. Has not vomited in a few days. Pt states "she feels bad all the time."  Sunday sx Vomiting some nausea bm going ok no blood Patient also has ongoing severe back pain and rib pain and hip pain is seen orthopedist they told her there is really nothing she could do for this she does take a half a pain pill when necessary with Tylenol and she states it does a good job without making her drowsy she request to have more and requests a larger quantity she denies abusing it keeps it in a safe place  Virtual Visit via Telephone Note  I connected with [name] on 05/27/20 at  1:10 PM EST by telephone and verified that I am speaking with the correct person using two identifiers.  Location: Patient: home Provider: office   I discussed the limitations, risks, security and privacy concerns of performing an evaluation and management service by telephone and the availability of in person appointments. I also discussed with the patient that there may be a patient responsible charge related to this service. The patient expressed understanding and agreed to proceed.   History of Present Illness:    Observations/Objective:   Assessment and Plan:   Follow Up Instructions:    I discussed the assessment and treatment plan with the patient. The patient was provided an opportunity to ask questions and all were answered. The patient agreed with the plan and demonstrated an understanding of the instructions.   The patient was advised to call  back or seek an in-person evaluation if the symptoms worsen or if the condition fails to improve as anticipated.  I provided 30 documentation as well as chart review and time spent with patient minutes of non-face-to-face time during this encounter.       Review of Systems Please see above    Objective:   Physical Exam Today's visit was via telephone Physical exam was not possible for this visit        Assessment & Plan:  History of small bowel obstruction Intermittent nausea with vomiting Patient was encouraged to watch his closely if she starts having severe abdominal pain and repetitive vomiting she will need to go to the ER Call us if any ongoing troubles  History of hypertension she feels her blood pressure doing good taking her medicine on a regular basis we will do lab work in the spring  Generalized anxiety and depression she states this is doing well currently takes her sertraline on a regular basis  Anxiety uses Xanax on a occasional basis but denies abusing it  Hypothyroidism takes her medication on a regular basis we will check lab work in the springtime.  Continue current medication.  We will plan to see the patient has home visit or an office visit in April

## 2020-05-29 ENCOUNTER — Other Ambulatory Visit: Payer: Self-pay | Admitting: Family Medicine

## 2020-06-01 ENCOUNTER — Telehealth: Payer: Self-pay | Admitting: Family Medicine

## 2020-06-01 NOTE — Telephone Encounter (Signed)
Patient is requesting refill on hydrocodone  °

## 2020-06-02 ENCOUNTER — Other Ambulatory Visit: Payer: Self-pay | Admitting: Family Medicine

## 2020-06-02 MED ORDER — HYDROCODONE-ACETAMINOPHEN 5-325 MG PO TABS: 1.0000 | ORAL_TABLET | Freq: Four times a day (QID) | ORAL | 0 refills | Status: DC | PRN

## 2020-06-02 NOTE — Telephone Encounter (Signed)
Done [employer]

## 2020-06-16 ENCOUNTER — Other Ambulatory Visit: Payer: Self-pay | Admitting: Family Medicine

## 2020-06-30 ENCOUNTER — Encounter: Payer: Self-pay | Admitting: Family Medicine

## 2020-07-02 ENCOUNTER — Other Ambulatory Visit: Payer: Self-pay | Admitting: Family Medicine

## 2020-07-02 MED ORDER — HYDROCODONE-ACETAMINOPHEN 5-325 MG PO TABS: 1.0000 | ORAL_TABLET | Freq: Four times a day (QID) | ORAL | 0 refills | Status: DC | PRN

## 2020-07-17 ENCOUNTER — Other Ambulatory Visit: Payer: Self-pay | Admitting: Family Medicine

## 2020-08-07 ENCOUNTER — Other Ambulatory Visit: Payer: Self-pay | Admitting: Family Medicine

## 2020-08-13 DIAGNOSIS — H5213 Myopia, bilateral: Secondary | ICD-10-CM | POA: Diagnosis not present

## 2020-08-18 ENCOUNTER — Other Ambulatory Visit: Payer: Self-pay | Admitting: Family Medicine

## 2020-08-27 ENCOUNTER — Other Ambulatory Visit: Payer: Self-pay

## 2020-08-27 ENCOUNTER — Telehealth: Payer: Self-pay | Admitting: Family Medicine

## 2020-08-27 ENCOUNTER — Ambulatory Visit: Payer: Medicare Other | Admitting: Family Medicine

## 2020-08-27 DIAGNOSIS — E038 Other specified hypothyroidism: Secondary | ICD-10-CM | POA: Diagnosis not present

## 2020-08-27 DIAGNOSIS — G609 Hereditary and idiopathic neuropathy, unspecified: Secondary | ICD-10-CM | POA: Diagnosis not present

## 2020-08-27 DIAGNOSIS — D509 Iron deficiency anemia, unspecified: Secondary | ICD-10-CM

## 2020-08-27 DIAGNOSIS — M25559 Pain in unspecified hip: Secondary | ICD-10-CM

## 2020-08-27 DIAGNOSIS — E538 Deficiency of other specified B group vitamins: Secondary | ICD-10-CM

## 2020-08-27 DIAGNOSIS — M1990 Unspecified osteoarthritis, unspecified site: Secondary | ICD-10-CM | POA: Diagnosis not present

## 2020-08-27 DIAGNOSIS — I4811 Longstanding persistent atrial fibrillation: Secondary | ICD-10-CM

## 2020-08-27 NOTE — Progress Notes (Signed)
   Subjective:    Patient ID: [name], female    DOB: [birth year], 87 y.o.   MRN: [id number]  HPI Provider doing home visit for patient. Pt does have concerns regarding foot pain and ear issues.  Home visit moderately complex Relates a lot of burning pain and discomfort in the feet In addition to this finds her hips causing a lot of trouble She does have to take hydrocodone intermittently for this She denies any breathing troubles chest tightness pressure pain denies any rectal bleeding Appetites been good denies being depressed or suicidal She is frustrated that she cannot get around as well as she used to   Review of Systems     Objective:   Physical Exam Lungs are clear no crackles respiratory rate normal heart regular no murmurs or gallops abdomen-no complaint of pain extremities does have some puffiness in the feet but nothing severe skin warm dry       Assessment & Plan:  Check lab work 1. Idiopathic peripheral neuropathy Check B12 level await results okay comment  2. Hip pain Ongoing hip pain will give her a brief refill of hydrocodone to use sparingly  3. Arthritis Tylenol be the best approach for this  4. Other specified hypothyroidism Continue thyroid medicine check lab work  5. Longstanding persistent atrial fibrillation (HCC) Heart rate is controlled currently seems normal sinus rhythm currently not a candidate for anticoagulations because of her previous GI bleed  6. B12 deficiency Check B12 may be contributing neuropathy of the feet History not iron deficient anemia check lab work

## 2020-08-27 NOTE — Telephone Encounter (Signed)
[name] call about patient not being able to ear out of one ear not sure which one but wanting to know if you could send someone out to clean her ears. She stated you have sent someone before but couldn't find it in her chart. Patient has a home visit at 3:30 today and wanting to know about this if it can be done again. Please advise

## 2020-08-27 NOTE — Telephone Encounter (Signed)
I will be able to do the home visit but after I finished the clinical work here  I can look into her ears but as for cleaning them out we will need to set her up with ENT I am not aware of anyone currently who would come out to the home to clean out the ear

## 2020-08-28 ENCOUNTER — Other Ambulatory Visit: Payer: Self-pay | Admitting: *Deleted

## 2020-08-28 DIAGNOSIS — H6120 Impacted cerumen, unspecified ear: Secondary | ICD-10-CM

## 2020-08-28 DIAGNOSIS — D509 Iron deficiency anemia, unspecified: Secondary | ICD-10-CM

## 2020-08-28 DIAGNOSIS — G629 Polyneuropathy, unspecified: Secondary | ICD-10-CM

## 2020-08-28 DIAGNOSIS — E039 Hypothyroidism, unspecified: Secondary | ICD-10-CM

## 2020-08-28 DIAGNOSIS — M2550 Pain in unspecified joint: Secondary | ICD-10-CM

## 2020-08-28 DIAGNOSIS — I10 Essential (primary) hypertension: Secondary | ICD-10-CM

## 2020-08-28 MED ORDER — HYDROCODONE-ACETAMINOPHEN 5-325 MG PO TABS: ORAL_TABLET | ORAL | 0 refills | Status: DC

## 2020-08-31 ENCOUNTER — Ambulatory Visit
Admission: EM | Admit: 2020-08-31 | Discharge: 2020-08-31 | Disposition: A | Discharge status: Home / Self Care | Payer: Medicare Other | Attending: Family Medicine | Admitting: Family Medicine

## 2020-08-31 ENCOUNTER — Other Ambulatory Visit: Payer: Self-pay

## 2020-08-31 ENCOUNTER — Encounter: Payer: Self-pay | Admitting: Emergency Medicine

## 2020-08-31 ENCOUNTER — Telehealth: Payer: Self-pay

## 2020-08-31 DIAGNOSIS — H6123 Impacted cerumen, bilateral: Secondary | ICD-10-CM

## 2020-08-31 DIAGNOSIS — E039 Hypothyroidism, unspecified: Secondary | ICD-10-CM | POA: Diagnosis not present

## 2020-08-31 DIAGNOSIS — M2550 Pain in unspecified joint: Secondary | ICD-10-CM | POA: Diagnosis not present

## 2020-08-31 DIAGNOSIS — I10 Essential (primary) hypertension: Secondary | ICD-10-CM | POA: Diagnosis not present

## 2020-08-31 DIAGNOSIS — D509 Iron deficiency anemia, unspecified: Secondary | ICD-10-CM | POA: Diagnosis not present

## 2020-08-31 MED ORDER — CARBAMIDE PEROXIDE 6.5 % OT SOLN: 5.0000 [drp] | Freq: Two times a day (BID) | OTIC | 0 refills | Status: DC | PRN

## 2020-08-31 NOTE — Telephone Encounter (Signed)
Pt does not need referal to ENT she had her ears cleaned at Urgent Care

## 2020-08-31 NOTE — ED Triage Notes (Signed)
Wax buildup in bilateral ears.  Seen by Dr Wolfgang Phoenix at home and he said she needed both ears cleaned.

## 2020-08-31 NOTE — ED Provider Notes (Signed)
[facility]    CSN: [id number] Arrival date & time: 08/31/20  1251      History   Chief Complaint Chief Complaint  Patient presents with  . Cerumen Impaction    HPI [name] is a 87 y.o. female.   HPI Patient here today for cerumen impaction in both ears.  Seen by PCP who referred her to have ears cleaned. Patient denies pain or altered hearing.  Past Medical History:  Diagnosis Date  . Abdominal pain, chronic, right lower quadrant    "ever since I had my appendix removed"  . Atrial fibrillation ([place])   . Breast cancer ([place])   . Cancer ([place])    left side  . Chronic back pain   . Edema   . GERD (gastroesophageal reflux disease)   . Hyperlipidemia   . Hypertension   . Osteopenia   . Osteoporosis   . Ovarian cyst 02/2015   left  . Recurrent abdominal pain    LLQ    Patient Active Problem List   Diagnosis Date Noted  . SBO (small bowel obstruction) ([place]) 11/07/2019  . Nausea and vomiting 11/07/2019  . Hyperglycemia 11/07/2019  . Right hip pain 08/01/2019  . GI bleed 02/16/2019  . HLD (hyperlipidemia) 07/18/2017  . B12 deficiency 07/18/2017  . SAH (subarachnoid hemorrhage) ([place]) 07/17/2017  . ICH (intracerebral hemorrhage) ([place]) 07/16/2017  . Cervical disc disease 05/09/2017  . Hypothyroid 05/09/2017  . Atrial fibrillation ([place]) 01/12/2016  . Osteoarthritis of right knee 12/15/2015  . Major depression 09/22/2015  . Ventral hernia 05/21/2015  . Ovarian tumor 04/13/2015  . Squamous cell skin cancer 08/13/2014  . Impaired fasting glucose 08/05/2013  . Neuropathy 01/11/2013  . Long term (current) use of anticoagulants 07/27/2012  . Anxiety 01/29/2012  . GERD (gastroesophageal reflux disease) 01/29/2012  . Chronic anticoagulation 01/29/2012  . Sciatica of right side 05/10/2011    Past Surgical History:  Procedure Laterality Date  . APPENDECTOMY    . APPENDECTOMY    . BREAST SURGERY Left    lumpectomy  . CARDIOVERSION N/A  03/31/2016   Procedure: CARDIOVERSION;  Surgeon: Josue Hector, MD;  Location: [facility];  Service: Cardiovascular;  Laterality: N/A;  . COLON RESECTION    . COLON SURGERY    . HERNIA REPAIR    . HERNIA REPAIR      OB History    Gravida  2   Para  1   Term      Preterm      AB  1   Living  1     SAB  1   IAB      Ectopic      Multiple      Live Births               Home Medications    Prior to Admission medications   Medication Sig Start Date End Date Taking? Authorizing Provider  ALPRAZolam (XANAX) 0.25 MG tablet TAKE 1 TABLET BY MOUTH TWICE DAILY AS NEEDED FOR ANXIETY OR SLEEP. 08/19/20   [name], MD  diltiazem (CARDIZEM CD) 120 MG 24 hr capsule TAKE (1) CAPSULE BY MOUTH ONCE DAILY. 05/27/20   [name], MD  HYDROcodone-acetaminophen (NORCO/VICODIN) 5-325 MG tablet TAKE ONE TABLET EVERY 6 HOURS AS NEEDED FOR MODERATE PAIN 08/28/20   [name], MD  levothyroxine (SYNTHROID) 75 MCG tablet Take 1 tablet (75 mcg total) by mouth daily. 05/27/20   [name], MD  lisinopril (ZESTRIL) 2.5 MG tablet Take 1 tablet (2.5 mg total) by mouth daily. 05/26/20   [name], MD  metoprolol succinate (TOPROL-XL) 50 MG 24 hr tablet Take with or immediately following a meal. 05/27/20   [place]uk[place], Elayne Snare, MD  Multiple Vitamin (MULTIVITAMIN) tablet Take 1 tablet by mouth daily.    [provider]  pantoprazole (PROTONIX) 40 MG tablet Take 1 tablet (40 mg total) by mouth daily. 05/27/20   [name], MD  potassium chloride (KLOR-CON) 10 MEQ tablet Take 1 tablet (10 mEq total) by mouth daily. 05/27/20   [name], MD  QC ACETAMINOPHEN 8 HOURS 650 MG CR tablet Take 1 tablet by mouth in the morning and at bedtime. 10/23/19   [provider]  senna-docusate (SENOKOT-S) 8.6-50 MG tablet Take 2 tablets by mouth at bedtime. 11/09/19 11/08/20  Roxan Hockey, MD  sertraline (ZOLOFT) 50 MG tablet Take 1 tablet (50 mg total) by mouth daily. 05/27/20   [name], MD  torsemide (DEMADEX) 20 MG tablet TAKE (1) TABLET BY MOUTH EVERY OTHER DAY. 05/27/20   [name], MD  vitamin B-12 1000 MCG tablet Take 1 tablet (1,000 mcg total) by mouth daily. 07/21/17   Mary Sella, NP    Family History Family History  Problem Relation Age of Onset  . Diabetes Mother   . Heart disease Other   . Arthritis Other   . Cancer Other   . Hyperlipidemia Brother     Social History Social History   Tobacco Use  . Smoking status: Never Smoker  . Smokeless tobacco: Never Used  Vaping Use  . Vaping Use: Never used  Substance Use Topics  . Alcohol use: No    Alcohol/week: 0.0 standard drinks  . Drug use: No     Allergies   Amiodarone, Cephalosporins, Meloxicam, Robaxin [methocarbamol], Amoxicillin, Lovenox [enoxaparin sodium], Morphine, Morphine and related, Penicillins, Sulfa antibiotics, Lipitor [atorvastatin], and Lorazepam   Review of Systems Review of Systems Pertinent negatives listed in HPI   Physical Exam Triage Vital Signs ED Triage Vitals  Enc Vitals Group     BP 08/31/20 1412 (!) 149/63     Pulse Rate 08/31/20 1412 61     Resp 08/31/20 1412 18     Temp 08/31/20 1412 98.2 F (36.8 C)     Temp Source 08/31/20 1412 Oral     SpO2 08/31/20 1412 95 %     Weight --      Height --      Head Circumference --      Peak Flow --      Pain Score 08/31/20 1411 0     Pain Loc --      Pain Edu? --      Excl. in [place]? --    No data found.  Updated Vital Signs BP (!) 149/63 (BP Location: Right Arm)   Pulse 61   Temp 98.2 F (36.8 C) (Oral)   Resp 18   SpO2 95%   Visual Acuity Right Eye Distance:   Left Eye Distance:   Bilateral Distance:    Right Eye Near:   Left Eye Near:    Bilateral Near:     Physical Exam General appearance:Alert,  cooperative and in no distress Head: Normocephalic, without obvious abnormality, atraumatic ENT: B/L ears cerumen impaction, recheck TM visible and normal   Respiratory: Respirations  even and unlabored, normal respiratory rate Heart: Rate and rhythm normal. No gallop or murmurs  noted on exam  Extremities: No gross deformities Skin: Skin color, texture, turgor normal. No rashes seen  Psych: Appropriate mood and affect.  UC Treatments / Results  Labs (all labs ordered are listed, but only abnormal results are displayed) Labs Reviewed - No data to display  EKG   Radiology No results found.  Procedures Procedures (including critical care time)  Medications Ordered in UC Medications - No data to display  Initial Impression / Assessment and Plan / UC Course  I have reviewed the triage vital signs and the nursing notes.  Pertinent labs & imaging results that were available during my care of the patient were reviewed by me and considered in my medical decision making (see chart for details).    Tolerated bilateral ear irrigation. For management of cerumen impaction Debrox drops prescribed PRN Final Clinical Impressions(s) / UC Diagnoses   Final diagnoses:  Bilateral impacted cerumen   Discharge Instructions   None    ED Prescriptions    Medication Sig Dispense Auth. Provider   carbamide peroxide (DEBROX) 6.5 % OTIC solution Place 5 drops into both ears 2 (two) times daily as needed (as needed for ear wax build up). 15 mL Scot Jun, FNP     PDMP not reviewed this encounter.   Scot Jun, FNP 08/31/20 2258

## 2020-09-01 ENCOUNTER — Encounter: Payer: Self-pay | Admitting: Family Medicine

## 2020-09-01 ENCOUNTER — Telehealth: Payer: Self-pay

## 2020-09-01 ENCOUNTER — Telehealth: Payer: Self-pay | Admitting: *Deleted

## 2020-09-01 DIAGNOSIS — E038 Other specified hypothyroidism: Secondary | ICD-10-CM | POA: Diagnosis not present

## 2020-09-01 DIAGNOSIS — G609 Hereditary and idiopathic neuropathy, unspecified: Secondary | ICD-10-CM | POA: Diagnosis not present

## 2020-09-01 DIAGNOSIS — I4819 Other persistent atrial fibrillation: Secondary | ICD-10-CM | POA: Diagnosis not present

## 2020-09-01 DIAGNOSIS — I10 Essential (primary) hypertension: Secondary | ICD-10-CM | POA: Diagnosis not present

## 2020-09-01 DIAGNOSIS — E538 Deficiency of other specified B group vitamins: Secondary | ICD-10-CM | POA: Diagnosis not present

## 2020-09-01 DIAGNOSIS — D509 Iron deficiency anemia, unspecified: Secondary | ICD-10-CM | POA: Diagnosis not present

## 2020-09-01 DIAGNOSIS — M1990 Unspecified osteoarthritis, unspecified site: Secondary | ICD-10-CM | POA: Diagnosis not present

## 2020-09-01 DIAGNOSIS — M25551 Pain in right hip: Secondary | ICD-10-CM | POA: Diagnosis not present

## 2020-09-01 DIAGNOSIS — Z79891 Long term (current) use of opiate analgesic: Secondary | ICD-10-CM | POA: Diagnosis not present

## 2020-09-01 DIAGNOSIS — Z79899 Other long term (current) drug therapy: Secondary | ICD-10-CM | POA: Diagnosis not present

## 2020-09-01 DIAGNOSIS — M25552 Pain in left hip: Secondary | ICD-10-CM | POA: Diagnosis not present

## 2020-09-01 LAB — COMPREHENSIVE METABOLIC PANEL
ALT: 12 IU/L (ref 0–32)
AST: 19 IU/L (ref 0–40)
Albumin/Globulin Ratio: 2 (ref 1.2–2.2)
Albumin: 4.3 g/dL (ref 3.5–4.6)
Alkaline Phosphatase: 66 IU/L (ref 44–121)
BUN/Creatinine Ratio: 22 (ref 12–28)
BUN: 36 mg/dL (ref 10–36)
Bilirubin Total: 0.2 mg/dL (ref 0.0–1.2)
CO2: 21 mmol/L (ref 20–29)
Calcium: 9.4 mg/dL (ref 8.7–10.3)
Chloride: 100 mmol/L (ref 96–106)
Creatinine, Ser: 1.66 mg/dL — ABNORMAL HIGH (ref 0.57–1.00)
Globulin, Total: 2.1 g/dL (ref 1.5–4.5)
Glucose: 85 mg/dL (ref 65–99)
Potassium: 4.8 mmol/L (ref 3.5–5.2)
Sodium: 142 mmol/L (ref 134–144)
Total Protein: 6.4 g/dL (ref 6.0–8.5)
eGFR: 28 mL/min/{1.73_m2} — ABNORMAL LOW (ref >59)

## 2020-09-01 LAB — CBC WITH DIFFERENTIAL/PLATELET
Basophils Absolute: 0 10*3/uL (ref 0.0–0.2)
Basos: 0 %
EOS (ABSOLUTE): 0.3 10*3/uL (ref 0.0–0.4)
Eos: 4 %
Hematocrit: 33.8 % — ABNORMAL LOW (ref 34.0–46.6)
Hemoglobin: 11.1 g/dL (ref 11.1–15.9)
Immature Grans (Abs): 0 10*3/uL (ref 0.0–0.1)
Immature Granulocytes: 1 %
Lymphocytes Absolute: 2.3 10*3/uL (ref 0.7–3.1)
Lymphs: 29 %
MCH: 28.9 pg (ref 26.6–33.0)
MCHC: 32.8 g/dL (ref 31.5–35.7)
MCV: 88 fL (ref 79–97)
Monocytes Absolute: 0.8 10*3/uL (ref 0.1–0.9)
Monocytes: 10 %
Neutrophils Absolute: 4.5 10*3/uL (ref 1.4–7.0)
Neutrophils: 56 %
Platelets: 237 10*3/uL (ref 150–450)
RBC: 3.84 x10E6/uL (ref 3.77–5.28)
RDW: 12.6 % (ref 11.7–15.4)
WBC: 7.9 10*3/uL (ref 3.4–10.8)

## 2020-09-01 LAB — TSH: TSH: 1.87 u[IU]/mL (ref 0.450–4.500)

## 2020-09-01 LAB — SEDIMENTATION RATE: Sed Rate: 14 mm/hr (ref 0–40)

## 2020-09-01 LAB — FERRITIN: Ferritin: 174 ng/mL — ABNORMAL HIGH (ref 15–150)

## 2020-09-01 LAB — VITAMIN B12: Vitamin B-12: 1569 pg/mL — ABNORMAL HIGH (ref 232–1245)

## 2020-09-01 NOTE — Telephone Encounter (Signed)
[name] at [place]  1x week for 5 weeks  She feels she will benefit from [place] [phone]

## 2020-09-01 NOTE — Telephone Encounter (Signed)
Per dr Nicki Reaper: Will do follow-up visit in 4 months-please put this at the end of the day in 4 months as a home visit thank you

## 2020-09-02 NOTE — Telephone Encounter (Signed)
Dr Nicki Reaper wanted this as a home visit so when she calls it needs to be end of day for home visit

## 2020-09-02 NOTE — Telephone Encounter (Signed)
[facility] will send out letter to schedule 4 month follow up

## 2020-09-03 NOTE — Telephone Encounter (Signed)
Verbal order given to [name] on her vm.

## 2020-09-03 NOTE — Telephone Encounter (Signed)
That would be fine for the verbal order thank you

## 2020-09-07 NOTE — Progress Notes (Signed)
Subjective:   [name] is a 87 y.o. female who presents for an Initial Medicare Annual Wellness Visit.  I connected with Eben Burow today by telephone and verified that I am speaking with the correct person using two identifiers. Location patient: home Location provider: work Persons participating in the virtual visit: patient, provider.   I discussed the limitations, risks, security and privacy concerns of performing an evaluation and management service by telephone and the availability of in person appointments. I also discussed with the patient that there may be a patient responsible charge related to this service. The patient expressed understanding and verbally consented to this telephonic visit.    Interactive audio and video telecommunications were attempted between this provider and patient, however failed, due to patient having technical difficulties OR patient did not have access to video capability.  We continued and completed visit with audio only.      Review of Systems    N/A  Cardiac Risk Factors include: advanced age (>56men, >45 women);dyslipidemia     Objective:    Today's Vitals   09/08/20 1003  PainSc: 5    There is no height or weight on file to calculate BMI.  Advanced Directives 09/08/2020 11/07/2019 11/06/2019 07/25/2019 02/16/2019 02/16/2019 06/22/2018  Does Patient Have a Medical Advance Directive? Yes Yes Yes Yes Yes Yes Yes  Type of Advance Directive Living will;Healthcare Power of [place];Living will [place];Living will Living will;Healthcare Power of Attorney Living will;Healthcare Power of Attorney Living will;Healthcare Power of Attorney Living will;Healthcare Power of Attorney  Does patient want to make changes to medical advance directive? No - Patient declined No - Patient declined - - No - Patient declined - -  Copy of [place] in Chart? No - copy requested Yes - validated  most recent copy scanned in chart (See row information) - - No - copy requested - -  Would patient like information on creating a medical advance directive? - - - - - - -  Pre-existing out of facility DNR order (yellow form or pink MOST form) - - - - - - -    Current Medications (verified) Outpatient Encounter Medications as of 09/08/2020  Medication Sig  . ALPRAZolam (XANAX) 0.25 MG tablet TAKE 1 TABLET BY MOUTH TWICE DAILY AS NEEDED FOR ANXIETY OR SLEEP.  . carbamide peroxide (DEBROX) 6.5 % OTIC solution Place 5 drops into both ears 2 (two) times daily as needed (as needed for ear wax build up).  . diltiazem (CARDIZEM CD) 120 MG 24 hr capsule TAKE (1) CAPSULE BY MOUTH ONCE DAILY.  Marland Kitchen HYDROcodone-acetaminophen (NORCO/VICODIN) 5-325 MG tablet TAKE ONE TABLET EVERY 6 HOURS AS NEEDED FOR MODERATE PAIN  . levothyroxine (SYNTHROID) 75 MCG tablet Take 1 tablet (75 mcg total) by mouth daily.  Marland Kitchen lisinopril (ZESTRIL) 2.5 MG tablet Take 1 tablet (2.5 mg total) by mouth daily.  . metoprolol succinate (TOPROL-XL) 50 MG 24 hr tablet Take with or immediately following a meal.  . Multiple Vitamin (MULTIVITAMIN) tablet Take 1 tablet by mouth daily.  . pantoprazole (PROTONIX) 40 MG tablet Take 1 tablet (40 mg total) by mouth daily.  . potassium chloride (KLOR-CON) 10 MEQ tablet Take 1 tablet (10 mEq total) by mouth daily.  . QC ACETAMINOPHEN 8 HOURS 650 MG CR tablet Take 1 tablet by mouth in the morning and at bedtime.  . senna-docusate (SENOKOT-S) 8.6-50 MG tablet Take 2 tablets by mouth at bedtime.  Marland Kitchen  sertraline (ZOLOFT) 50 MG tablet Take 1 tablet (50 mg total) by mouth daily.  Marland Kitchen torsemide (DEMADEX) 20 MG tablet TAKE (1) TABLET BY MOUTH EVERY OTHER DAY.  . vitamin B-12 1000 MCG tablet Take 1 tablet (1,000 mcg total) by mouth daily.   No facility-administered encounter medications on file as of 09/08/2020.    Allergies (verified) Amiodarone, Cephalosporins, Meloxicam, Robaxin [methocarbamol], Amoxicillin,  Lovenox [enoxaparin sodium], Morphine, Morphine and related, Penicillins, Sulfa antibiotics, Lipitor [atorvastatin], and Lorazepam   History: Past Medical History:  Diagnosis Date  . Abdominal pain, chronic, right lower quadrant    "ever since I had my appendix removed"  . Atrial fibrillation ([place])   . Breast cancer ([place])   . Cancer ([place])    left side  . Chronic back pain   . Edema   . GERD (gastroesophageal reflux disease)   . Hyperlipidemia   . Hypertension   . Osteopenia   . Osteoporosis   . Ovarian cyst 02/2015   left  . Recurrent abdominal pain    LLQ   Past Surgical History:  Procedure Laterality Date  . APPENDECTOMY    . APPENDECTOMY    . BREAST SURGERY Left    lumpectomy  . CARDIOVERSION N/A 03/31/2016   Procedure: CARDIOVERSION;  Surgeon: Josue Hector, MD;  Location: [facility];  Service: Cardiovascular;  Laterality: N/A;  . COLON RESECTION    . COLON SURGERY    . HERNIA REPAIR    . HERNIA REPAIR     Family History  Problem Relation Age of Onset  . Diabetes Mother   . Heart disease Other   . Arthritis Other   . Cancer Other   . Hyperlipidemia Brother    Social History   Socioeconomic History  . Marital status: Widowed    Spouse name: Not on file  . Number of children: 1  . Years of education: Not on file  . Highest education level: Not on file  Occupational History    Employer: RETIRED  Tobacco Use  . Smoking status: Never Smoker  . Smokeless tobacco: Never Used  Vaping Use  . Vaping Use: Never used  Substance and Sexual Activity  . Alcohol use: No    Alcohol/week: 0.0 standard drinks  . Drug use: No  . Sexual activity: Never  Other Topics Concern  . Not on file  Social History Narrative  . Not on file   Social Determinants of Health   Financial Resource Strain: Low Risk   . Difficulty of Paying Living Expenses: Not hard at all  Food Insecurity: No Food Insecurity  . Worried About Charity fundraiser in the Last Year: Never true  . Ran  Out of Food in the Last Year: Never true  Transportation Needs: No Transportation Needs  . Lack of Transportation (Medical): No  . Lack of Transportation (Non-Medical): No  Physical Activity: Inactive  . Days of Exercise per Week: 0 days  . Minutes of Exercise per Session: 0 min  Stress: No Stress Concern Present  . Feeling of Stress : Not at all  Social Connections: Socially Isolated  . Frequency of Communication with Friends and Family: More than three times a week  . Frequency of Social Gatherings with Friends and Family: Three times a week  . Attends Religious Services: Never  . Active Member of Clubs or Organizations: No  . Attends Archivist Meetings: Never  . Marital Status: Widowed    Tobacco Counseling Counseling given: Not Answered  Clinical Intake:  Pre-visit preparation completed: Yes  Pain : 0-10 Pain Score: 5  Pain Type: Chronic pain Pain Location: Hip Pain Orientation: Right,Left Pain Descriptors / Indicators: Aching Pain Onset: More than a month ago Pain Frequency: Constant Pain Relieving Factors: heating pad  Pain Relieving Factors: heating pad  Nutritional Risks: None Diabetes: No  How often do you need to have someone help you when you read instructions, pamphlets, or other written materials from your doctor or pharmacy?: 3 - Sometimes (due to vision issues and needing new glasses)  Diabetic?No   Interpreter Needed?: No  Information entered by :: [place] of Daily Living In your present state of health, do you have any difficulty performing the following activities: 09/08/2020 11/07/2019  Hearing? N N  Vision? N N  Difficulty concentrating or making decisions? N N  Walking or climbing stairs? Y Y  Dressing or bathing? N Y  Doing errands, shopping? Tempie Donning  Preparing Food and eating ? N -  Using the Toilet? N -  In the past six months, have you accidently leaked urine? Y -  Comment has occassional bladder leakage -   Do you have problems with loss of bowel control? N -  Managing your Medications? N -  Managing your Finances? N -  Housekeeping or managing your Housekeeping? Y -  Some recent data might be hidden    Patient Care Team: [name], MD as PCP - General (Family Medicine)  Indicate any recent Medical Services you may have received from other than [facility] providers in the past year (date may be approximate).     Assessment:   This is a routine wellness examination for Cecilee.  Hearing/Vision screen  Hearing Screening   125Hz  250Hz  500Hz  1000Hz  2000Hz  3000Hz  4000Hz  6000Hz  8000Hz   Right ear:           Left ear:           Vision Screening Comments: Patient states gets eye exams once per year. Wears glasses   Dietary issues and exercise activities discussed: Current Exercise Habits: The patient does not participate in regular exercise at present, Exercise limited by: orthopedic condition(s) (balance issues)  Goals    . Patient Stated     I would like to stay in my home and avoid going into the nursing home.      Depression Screen PHQ 2/9 Scores 09/08/2020 02/23/2017 04/19/2016 09/22/2015 04/04/2014 09/06/2013  PHQ - 2 Score 5 0 0 3 0 3  PHQ- 9 Score 9 - - 11 - 8    Fall Risk Fall Risk  09/08/2020 08/27/2020 10/17/2019 08/13/2019 07/30/2019  Falls in the past year? 0 0 1 0 0  Number falls in past yr: 0 0 1 - -  Injury with Fall? 0 0 0 - -  Risk for fall due to : History of fall(s);Impaired balance/gait Impaired balance/gait Impaired mobility;Impaired balance/gait;History of fall(s) Impaired mobility -  Risk for fall due to: Comment - - - falls in the past over one year ago. none since using walker -  Follow up Falls evaluation completed;Falls prevention discussed Falls evaluation completed Falls evaluation completed;Education provided - -    FALL RISK PREVENTION PERTAINING TO THE HOME:  Any stairs in or around the home? No  If so, are there any without handrails? No  Home free of  loose throw rugs in walkways, pet beds, electrical cords, etc? Yes  Adequate lighting in your home to reduce risk of falls? Yes  ASSISTIVE DEVICES UTILIZED TO PREVENT FALLS:  Life alert? Yes  Use of a cane, walker or w/c? Yes  Grab bars in the bathroom? No  Shower chair or bench in shower? No  Elevated toilet seat or a handicapped toilet? Yes   Cognitive Function:     Normal cognitive status assessed by direct observation by this Nurse Health Advisor. No abnormalities found.      Immunizations Immunization History  Administered Date(s) Administered  . Fluad Quad(high Dose 65+) 02/04/2019, 02/29/2020  . Influenza Split 03/18/2013  . Influenza,inj,Quad PF,6+ Mos 03/06/2015, 03/17/2016, 02/23/2017, 02/13/2018  . Influenza-Unspecified 04/21/2012, 03/19/2014, 02/04/2019  . Moderna Sars-Covid-2 Vaccination 07/03/2019, 07/31/2019  . Pneumococcal Conjugate-13 12/27/2013  . Pneumococcal Polysaccharide-23 03/21/2004    TDAP status: Due, Education has been provided regarding the importance of this vaccine. Advised may receive this vaccine at local pharmacy or Health Dept. Aware to provide a copy of the vaccination record if obtained from local pharmacy or Health Dept. Verbalized acceptance and understanding.  Flu Vaccine status: Up to date  Pneumococcal vaccine status: Up to date  Covid-19 vaccine status: Completed vaccines  Qualifies for Shingles Vaccine? Yes   Zostavax completed No   Shingrix Completed?: No.    Education has been provided regarding the importance of this vaccine. Patient has been advised to call insurance company to determine out of pocket expense if they have not yet received this vaccine. Advised may also receive vaccine at local pharmacy or Health Dept. Verbalized acceptance and understanding.  Screening Tests Health Maintenance  Topic Date Due  . TETANUS/TDAP  Never done  . COVID-19 Vaccine (3 - Moderna risk 4-dose series) 08/28/2019  . INFLUENZA VACCINE   12/21/2020  . DEXA SCAN  Completed  . PNA vac Low Risk Adult  Completed  . HPV VACCINES  Aged Out    Health Maintenance  Health Maintenance Due  Topic Date Due  . TETANUS/TDAP  Never done  . COVID-19 Vaccine (3 - Moderna risk 4-dose series) 08/28/2019    Colorectal cancer screening: No longer required.   Mammogram status: No longer required due to age.  Bone Density Status: No Longer required due to age   Lung Cancer Screening: (Low Dose CT Chest recommended if Age 54-80 years, 30 pack-year currently smoking OR have quit w/in 15years.) does not qualify.   Lung Cancer Screening Referral: N/A   Additional Screening:  Hepatitis C Screening: does not qualify;   Vision Screening: Recommended annual ophthalmology exams for early detection of glaucoma and other disorders of the eye. Is the patient up to date with their annual eye exam?  Yes  Who is the provider or what is the name of the office in which the patient attends annual eye exams? Dr. Jorja Loa If pt is not established with a provider, would they like to be referred to a provider to establish care? No .   Dental Screening: Recommended annual dental exams for proper oral hygiene  Community Resource Referral / Chronic Care Management: CRR required this visit?  No   CCM required this visit?  No      Plan:     I have personally reviewed and noted the following in the patient's chart:   . Medical and social history . Use of alcohol, tobacco or illicit drugs  . Current medications and supplements . Functional ability and status . Nutritional status . Physical activity . Advanced directives . List of other physicians . Hospitalizations, surgeries, and ER visits in previous 12 months . Vitals .  Screenings to include cognitive, depression, and falls . Referrals and appointments  In addition, I have reviewed and discussed with patient certain preventive protocols, quality metrics, and best practice recommendations. A  written personalized care plan for preventive services as well as general preventive health recommendations were provided to patient.     Ofilia Neas, LPN   [id number]   Nurse Notes: None

## 2020-09-08 ENCOUNTER — Ambulatory Visit (INDEPENDENT_AMBULATORY_CARE_PROVIDER_SITE_OTHER): Payer: Medicare Other

## 2020-09-08 ENCOUNTER — Other Ambulatory Visit: Payer: Self-pay

## 2020-09-08 ENCOUNTER — Telehealth: Payer: Self-pay

## 2020-09-08 ENCOUNTER — Telehealth: Payer: Self-pay | Admitting: Family Medicine

## 2020-09-08 DIAGNOSIS — Z0000 Encounter for general adult medical examination without abnormal findings: Secondary | ICD-10-CM

## 2020-09-08 MED ORDER — TORSEMIDE 20 MG PO TABS: ORAL_TABLET | ORAL | 2 refills | Status: DC

## 2020-09-08 NOTE — Telephone Encounter (Signed)
FYI: Grand daughter Jeannetta Nap wanted me to let you know that patient had ears cleaned out at Urgent Care in [place]. They no longer need referral to ENT. They did call patient but grand daughter let them know they had ears cleaned out. Friendly daughter wanted to make sure provider knew that pt did not refuse service.

## 2020-09-08 NOTE — Patient Instructions (Signed)
[name] , Thank you for taking time to come for your Medicare Wellness Visit. I appreciate your ongoing commitment to your health goals. Please review the following plan we discussed and let me know if I can assist you in the future.   Screening recommendations/referrals: Colonoscopy: No longer required  Mammogram: No longer required  Bone Density: No longer required  Recommended yearly ophthalmology/optometry visit for glaucoma screening and checkup Recommended yearly dental visit for hygiene and checkup  Vaccinations: Influenza vaccine: Up to date, next due fall 2022  Pneumococcal vaccine: Completed series  Tdap vaccine: Currently due, if you would like to receive we recommend that you do so at your local pharmacy.  Shingles vaccine: Currently due for Shingrix, if you would like to receive we recommend that you do so at your local pharmacy.     Advanced directives: Please bring in copies of your advanced medical directives so that we may scan them into your chart.   Conditions/risks identified: None   Next appointment: None    Preventive Care 65 Years and Older, Female Preventive care refers to lifestyle choices and visits with your health care provider that can promote health and wellness. What does preventive care include?  A yearly physical exam. This is also called an annual well check.  Dental exams once or twice a year.  Routine eye exams. Ask your health care provider how often you should have your eyes checked.  Personal lifestyle choices, including:  Daily care of your teeth and gums.  Regular physical activity.  Eating a healthy diet.  Avoiding tobacco and drug use.  Limiting alcohol use.  Practicing safe sex.  Taking low-dose aspirin every day.  Taking vitamin and mineral supplements as recommended by your health care provider. What happens during an annual well check? The services and screenings done by your health care provider during your annual  well check will depend on your age, overall health, lifestyle risk factors, and family history of disease. Counseling  Your health care provider may ask you questions about your:  Alcohol use.  Tobacco use.  Drug use.  Emotional well-being.  Home and relationship well-being.  Sexual activity.  Eating habits.  History of falls.  Memory and ability to understand (cognition).  Work and work Statistician.  Reproductive health. Screening  You may have the following tests or measurements:  Height, weight, and BMI.  Blood pressure.  Lipid and cholesterol levels. These may be checked every 5 years, or more frequently if you are over 46 years old.  Skin check.  Lung cancer screening. You may have this screening every year starting at age 31 if you have a 30-pack-year history of smoking and currently smoke or have quit within the past 15 years.  Fecal occult blood test (FOBT) of the stool. You may have this test every year starting at age 41.  Flexible sigmoidoscopy or colonoscopy. You may have a sigmoidoscopy every 5 years or a colonoscopy every 10 years starting at age 72.  Hepatitis C blood test.  Hepatitis B blood test.  Sexually transmitted disease (STD) testing.  Diabetes screening. This is done by checking your blood sugar (glucose) after you have not eaten for a while (fasting). You may have this done every 1-3 years.  Bone density scan. This is done to screen for osteoporosis. You may have this done starting at age 72.  Mammogram. This may be done every 1-2 years. Talk to your health care provider about how often you should have regular  mammograms. Talk with your health care provider about your test results, treatment options, and if necessary, the need for more tests. Vaccines  Your health care provider may recommend certain vaccines, such as:  Influenza vaccine. This is recommended every year.  Tetanus, diphtheria, and acellular pertussis (Tdap, Td) vaccine.  You may need a Td booster every 10 years.  Zoster vaccine. You may need this after age 57.  Pneumococcal 13-valent conjugate (PCV13) vaccine. One dose is recommended after age 38.  Pneumococcal polysaccharide (PPSV23) vaccine. One dose is recommended after age 32. Talk to your health care provider about which screenings and vaccines you need and how often you need them. This information is not intended to replace advice given to you by your health care provider. Make sure you discuss any questions you have with your health care provider. Document Released: 06/05/2015 Document Revised: 01/27/2016 Document Reviewed: 03/10/2015 Elsevier Interactive Patient Education  2017 [employer] Prevention in the Home Falls can cause injuries. They can happen to people of all ages. There are many things you can do to make your home safe and to help prevent falls. What can I do on the outside of my home?  Regularly fix the edges of walkways and driveways and fix any cracks.  Remove anything that might make you trip as you walk through a door, such as a raised step or threshold.  Trim any bushes or trees on the path to your home.  Use bright outdoor lighting.  Clear any walking paths of anything that might make someone trip, such as rocks or tools.  Regularly check to see if handrails are loose or broken. Make sure that both sides of any steps have handrails.  Any raised decks and porches should have guardrails on the edges.  Have any leaves, snow, or ice cleared regularly.  Use sand or salt on walking paths during winter.  Clean up any spills in your garage right away. This includes oil or grease spills. What can I do in the bathroom?  Use night lights.  Install grab bars by the toilet and in the tub and shower. Do not use towel bars as grab bars.  Use non-skid mats or decals in the tub or shower.  If you need to sit down in the shower, use a plastic, non-slip stool.  Keep the  floor dry. Clean up any water that spills on the floor as soon as it happens.  Remove soap buildup in the tub or shower regularly.  Attach bath mats securely with double-sided non-slip rug tape.  Do not have throw rugs and other things on the floor that can make you trip. What can I do in the bedroom?  Use night lights.  Make sure that you have a light by your bed that is easy to reach.  Do not use any sheets or blankets that are too big for your bed. They should not hang down onto the floor.  Have a firm chair that has side arms. You can use this for support while you get dressed.  Do not have throw rugs and other things on the floor that can make you trip. What can I do in the kitchen?  Clean up any spills right away.  Avoid walking on wet floors.  Keep items that you use a lot in easy-to-reach places.  If you need to reach something above you, use a strong step stool that has a grab bar.  Keep electrical cords out of the way.  Do not use floor polish or wax that makes floors slippery. If you must use wax, use non-skid floor wax.  Do not have throw rugs and other things on the floor that can make you trip. What can I do with my stairs?  Do not leave any items on the stairs.  Make sure that there are handrails on both sides of the stairs and use them. Fix handrails that are broken or loose. Make sure that handrails are as long as the stairways.  Check any carpeting to make sure that it is firmly attached to the stairs. Fix any carpet that is loose or worn.  Avoid having throw rugs at the top or bottom of the stairs. If you do have throw rugs, attach them to the floor with carpet tape.  Make sure that you have a light switch at the top of the stairs and the bottom of the stairs. If you do not have them, ask someone to add them for you. What else can I do to help prevent falls?  Wear shoes that:  Do not have high heels.  Have rubber bottoms.  Are comfortable and fit  you well.  Are closed at the toe. Do not wear sandals.  If you use a stepladder:  Make sure that it is fully opened. Do not climb a closed stepladder.  Make sure that both sides of the stepladder are locked into place.  Ask someone to hold it for you, if possible.  Clearly mark and make sure that you can see:  Any grab bars or handrails.  First and last steps.  Where the edge of each step is.  Use tools that help you move around (mobility aids) if they are needed. These include:  Canes.  Walkers.  Scooters.  Crutches.  Turn on the lights when you go into a dark area. Replace any light bulbs as soon as they burn out.  Set up your furniture so you have a clear path. Avoid moving your furniture around.  If any of your floors are uneven, fix them.  If there are any pets around you, be aware of where they are.  Review your medicines with your doctor. Some medicines can make you feel dizzy. This can increase your chance of falling. Ask your doctor what other things that you can do to help prevent falls. This information is not intended to replace advice given to you by your health care provider. Make sure you discuss any questions you have with your health care provider. Document Released: 03/05/2009 Document Revised: 10/15/2015 Document Reviewed: 06/13/2014 Elsevier Interactive Patient Education  2017 [employer].

## 2020-09-08 NOTE — Telephone Encounter (Signed)
[place] called said Zinia legs are more swollen crackle in her lower lobe.   Call back [phone] Janett Billow

## 2020-09-08 NOTE — Telephone Encounter (Signed)
Grand daughter states she did not notice any swelling on Thursday. [place] daughter states that no trouble breathing. Someone from [place] came out the other day.  Pt had labs done last week and grand daughter is aware of results.  First week seeing her; Nurse Janett Billow can hear crackles in lower left lobe.  Pitting edema both legs.   Please advise. Thank you

## 2020-09-08 NOTE — Telephone Encounter (Signed)
I would recommend the torsemide be 1 taken Every morning. Change prescription to 30 with 2 refills If possible I would prefer for the patient to do a follow-up visit near the lunch hour next week here at the office.  If it is not possible for her to come to the office let me know and I will try to figure out a time to swing by there and listen to her lungs thank you

## 2020-09-08 NOTE — Telephone Encounter (Signed)
Pt grand daughter contacted and is aware to increase Torsemide. New script sent to [facility]. Pt has been placed on schedule for Monday 09/14/20 at 11:40 am.  Also contacted Janett Billow [facility] informed her of med change and upcoming appt.

## 2020-09-09 NOTE — Telephone Encounter (Signed)
Secure chat sent to [facility]

## 2020-09-09 NOTE — Telephone Encounter (Signed)
Notify Loma Sousa of this developement

## 2020-09-10 ENCOUNTER — Other Ambulatory Visit: Payer: Self-pay

## 2020-09-10 ENCOUNTER — Telehealth: Payer: Self-pay | Admitting: Family Medicine

## 2020-09-10 MED ORDER — LISINOPRIL 2.5 MG PO TABS: 2.5000 mg | ORAL_TABLET | Freq: Every day | ORAL | 1 refills | Status: DC

## 2020-09-10 NOTE — Telephone Encounter (Signed)
Marcella Dubs [phone]

## 2020-09-10 NOTE — Telephone Encounter (Signed)
Marcella Dubs calling to request verbal orders for PT. 1 week one (saw patient last Saturday)  Frequency 2 times a week for 3 weeks and then pt will be reassessed.  Please advise. Thank you

## 2020-09-10 NOTE — Telephone Encounter (Signed)
Left detailed message on [facility]

## 2020-09-10 NOTE — Telephone Encounter (Signed)
It would be perfectly fine to give the verbal thank you

## 2020-09-11 ENCOUNTER — Telehealth: Payer: Self-pay | Admitting: Family Medicine

## 2020-09-11 MED ORDER — NITROFURANTOIN MONOHYD MACRO 100 MG PO CAPS: 100.0000 mg | ORAL_CAPSULE | Freq: Two times a day (BID) | ORAL | 0 refills | Status: DC

## 2020-09-11 NOTE — Telephone Encounter (Signed)
Macrobid 100 mg twice daily for 5 days

## 2020-09-11 NOTE — Telephone Encounter (Signed)
Granddaughter states the patient is telling her she is having symptoms of UTI and needs antibiotic at [place]- she said she doesn't have a question about hydrocodone

## 2020-09-11 NOTE — Telephone Encounter (Signed)
Patient is requesting antibiotic for UTI and had question about her medication hydrocodone. [employer]

## 2020-09-11 NOTE — Telephone Encounter (Signed)
Prescription sent electronically to pharmacy. Granddaughter (DPR) notified. 

## 2020-09-14 ENCOUNTER — Encounter: Payer: Self-pay | Admitting: Family Medicine

## 2020-09-14 ENCOUNTER — Ambulatory Visit (INDEPENDENT_AMBULATORY_CARE_PROVIDER_SITE_OTHER): Payer: Medicare Other | Admitting: Family Medicine

## 2020-09-14 ENCOUNTER — Other Ambulatory Visit: Payer: Self-pay

## 2020-09-14 ENCOUNTER — Ambulatory Visit (HOSPITAL_COMMUNITY)
Admission: RE | Admit: 2020-09-14 | Discharge: 2020-09-14 | Disposition: A | Discharge status: Home / Self Care | Payer: Medicare Other | Source: Ambulatory Visit | Attending: Family Medicine | Admitting: Family Medicine

## 2020-09-14 VITALS — BP 134/65 | HR 66 | Temp 98.1°F

## 2020-09-14 DIAGNOSIS — R0600 Dyspnea, unspecified: Secondary | ICD-10-CM | POA: Insufficient documentation

## 2020-09-14 DIAGNOSIS — R0989 Other specified symptoms and signs involving the circulatory and respiratory systems: Secondary | ICD-10-CM | POA: Insufficient documentation

## 2020-09-14 DIAGNOSIS — I10 Essential (primary) hypertension: Secondary | ICD-10-CM | POA: Diagnosis not present

## 2020-09-14 DIAGNOSIS — R0609 Other forms of dyspnea: Secondary | ICD-10-CM

## 2020-09-14 MED ORDER — ALPRAZOLAM 0.25 MG PO TABS: ORAL_TABLET | ORAL | 2 refills | Status: DC

## 2020-09-14 NOTE — Progress Notes (Signed)
   Subjective:    Patient ID: [name], female    DOB: [birth year], 87 y.o.   MRN: [id number]  HPI Pt here for recheck of lungs. Pt home health nurse called in last week stating she heard crackles in lower lobe. Pt denied short of breath except with activity. Pt states she has had some swelling feet. Does elevate them at night.  Review of Systems     Objective:   Physical Exam  There is some crackles in the bases but no significant swelling in the legs heart regular I doubt that this is a CHF flareup      Assessment & Plan:  Chronic pain and discomfort with her hip and back sparing use of hydrocodone otherwise Tylenol as needed  Intermittent insomnia recommend just a half a tablet of Xanax when necessary I have counseled her about using this sparingly we will give her a short prescription  Crackles in the lungs I do not find overt CHF but she needs a chest x-ray we are having her take torsemide each morning We will follow-up again within 4 to 8 weeks

## 2020-09-15 ENCOUNTER — Encounter: Payer: Self-pay | Admitting: Family Medicine

## 2020-09-15 ENCOUNTER — Telehealth: Payer: Self-pay | Admitting: *Deleted

## 2020-09-15 NOTE — Telephone Encounter (Signed)
Granddaughter called and stated that the patient did have a sticker on her chest and stated they gave her stickers to put on in radiology and she put one on and they told her nevermind and took them back but she forgot to take the first one off and they just took it off of her.

## 2020-09-15 NOTE — Telephone Encounter (Signed)
Please see result note thank you 

## 2020-09-16 ENCOUNTER — Other Ambulatory Visit: Payer: Self-pay | Admitting: Family Medicine

## 2020-09-16 DIAGNOSIS — R0989 Other specified symptoms and signs involving the circulatory and respiratory systems: Secondary | ICD-10-CM

## 2020-09-16 NOTE — Telephone Encounter (Signed)
[place] daughter contacted and verbalized understanding.Chest xray ordered. See result note

## 2020-10-07 ENCOUNTER — Other Ambulatory Visit: Payer: Self-pay | Admitting: Family Medicine

## 2020-10-07 NOTE — Telephone Encounter (Signed)
Last seen:09/14/20

## 2020-11-09 ENCOUNTER — Other Ambulatory Visit: Payer: Self-pay | Admitting: Family Medicine

## 2020-11-13 ENCOUNTER — Other Ambulatory Visit: Payer: Self-pay | Admitting: Family Medicine

## 2020-12-01 ENCOUNTER — Other Ambulatory Visit: Payer: Self-pay

## 2020-12-01 ENCOUNTER — Ambulatory Visit (INDEPENDENT_AMBULATORY_CARE_PROVIDER_SITE_OTHER): Payer: Medicare Other | Admitting: Family Medicine

## 2020-12-01 ENCOUNTER — Encounter: Payer: Self-pay | Admitting: Family Medicine

## 2020-12-01 VITALS — BP 121/52 | HR 80 | Temp 98.4°F | Ht 63.0 in

## 2020-12-01 DIAGNOSIS — N289 Disorder of kidney and ureter, unspecified: Secondary | ICD-10-CM

## 2020-12-01 DIAGNOSIS — E038 Other specified hypothyroidism: Secondary | ICD-10-CM

## 2020-12-01 MED ORDER — METOPROLOL SUCCINATE ER 50 MG PO TB24: ORAL_TABLET | ORAL | 5 refills | Status: DC

## 2020-12-01 MED ORDER — TORSEMIDE 20 MG PO TABS: ORAL_TABLET | ORAL | 0 refills | Status: DC

## 2020-12-01 MED ORDER — DILTIAZEM HCL ER COATED BEADS 120 MG PO CP24: ORAL_CAPSULE | ORAL | 5 refills | Status: DC

## 2020-12-01 MED ORDER — HYDROCODONE-ACETAMINOPHEN 5-325 MG PO TABS: ORAL_TABLET | ORAL | 0 refills | Status: DC

## 2020-12-01 MED ORDER — SERTRALINE HCL 50 MG PO TABS: 50.0000 mg | ORAL_TABLET | Freq: Every day | ORAL | 5 refills | Status: DC

## 2020-12-01 MED ORDER — LEVOTHYROXINE SODIUM 75 MCG PO TABS: 75.0000 ug | ORAL_TABLET | Freq: Every day | ORAL | 6 refills | Status: DC

## 2020-12-01 MED ORDER — PANTOPRAZOLE SODIUM 40 MG PO TBEC: 40.0000 mg | DELAYED_RELEASE_TABLET | Freq: Every day | ORAL | 5 refills | Status: DC

## 2020-12-01 MED ORDER — POTASSIUM CHLORIDE ER 10 MEQ PO TBCR: 10.0000 meq | EXTENDED_RELEASE_TABLET | Freq: Every day | ORAL | 5 refills | Status: DC

## 2020-12-01 NOTE — Progress Notes (Signed)
   Subjective:    Patient ID: [name], female    DOB: 1923/05/31, 87 y.o.   MRN: [id number]  HPIpt arrives with son Richardson Landry.   Having pain in feet, right hip and low back pain. Now starting to have shoulder pain.  Patient with foot pain hip pain shoulder pain back pain much of this is related to arthritic changes related to her being 87 years old we did discuss various ways to try to help cope with this Patient has thyroid condition.  Takes thyroid medication on a regular basis.  States that the proper way.  Relates compliance.  States no negative side effects.  States condition seems to be under good control.  Patient for blood pressure check up.  The patient does have hypertension.  The patient is on medication.  Patient relates compliance with meds. Todays BP reviewed with the patient. Patient denies issues with medication. Patient relates reasonable diet. Patient tries to minimize salt. Patient aware of BP goals.  Review of Systems     Objective:   Physical Exam Lungs clear heart regular pulse normal BP is good extremities no edema arthritic changes are noted in the ankles.       Assessment & Plan:  Foot pain Hip and back pain Arthritic changes Tylenol. Move forward with pain medicine the hydrocodone only when necessary caution drowsiness recommend half a tablet follow-up again in 4 months time follow-up sooner problems  Thyroid check labs continue medication  Renal insufficiency check labs if still elevated stop lisinopril

## 2020-12-02 ENCOUNTER — Telehealth: Payer: Self-pay | Admitting: *Deleted

## 2020-12-02 LAB — BASIC METABOLIC PANEL
BUN/Creatinine Ratio: 19 (ref 12–28)
BUN: 25 mg/dL (ref 10–36)
CO2: 21 mmol/L (ref 20–29)
Calcium: 9.5 mg/dL (ref 8.7–10.3)
Chloride: 104 mmol/L (ref 96–106)
Creatinine, Ser: 1.33 mg/dL — ABNORMAL HIGH (ref 0.57–1.00)
Glucose: 117 mg/dL — ABNORMAL HIGH (ref 65–99)
Potassium: 4 mmol/L (ref 3.5–5.2)
Sodium: 142 mmol/L (ref 134–144)
eGFR: 37 mL/min/{1.73_m2} — ABNORMAL LOW (ref >59)

## 2020-12-02 LAB — TSH: TSH: 1.58 u[IU]/mL (ref 0.450–4.500)

## 2020-12-02 MED ORDER — ONDANSETRON 8 MG PO TBDP: 8.0000 mg | ORAL_TABLET | Freq: Three times a day (TID) | ORAL | 0 refills | Status: DC | PRN

## 2020-12-02 NOTE — Telephone Encounter (Signed)
I called pt with bw results and she states she just vomited about 6 times straight 3 -4 hours after  eating boiled egg and fruit. No fever, no abd pain. States this happens about once every 6 -8 weeks. States it started over a year ago and she mentioned it before at one of her visits. Wonders if it is her gallbladder. Feels ok now.

## 2020-12-02 NOTE — Telephone Encounter (Signed)
Complex issue If reoccurs and does nt get better to go to ED May order abd [place] due to freq voming spells May use zofran 8 mg odt if happens may use 1 tid prn under tongue as needed for nausea and vomiting, #10, 2 rf If severe er If keeps reoccuring follow up

## 2020-12-02 NOTE — Telephone Encounter (Signed)
Patient advised per Dr Nicki Reaper: Complex issue If reoccurs and does not get better to go to ED May order abd [place] due to freq voming spells May use zofran 8 mg odt if happens may use 1 tid prn nausea and vomiting, #10 If severe ER If keeps reoccuring follow  Patient verbalized understanding and wants to hold on ultrasound as she states the spells normally pass and are spaced out.  Prescription sent electronically to pharmacy.

## 2020-12-03 NOTE — Telephone Encounter (Signed)
error 

## 2020-12-03 NOTE — Telephone Encounter (Signed)
May hold on ultrasound for now thank you

## 2020-12-14 ENCOUNTER — Other Ambulatory Visit: Payer: Self-pay | Admitting: Family Medicine

## 2020-12-15 ENCOUNTER — Other Ambulatory Visit: Payer: Self-pay | Admitting: Family Medicine

## 2021-01-11 ENCOUNTER — Other Ambulatory Visit: Payer: Self-pay | Admitting: Family Medicine

## 2021-01-12 ENCOUNTER — Other Ambulatory Visit: Payer: Self-pay | Admitting: Family Medicine

## 2021-01-18 ENCOUNTER — Ambulatory Visit (INDEPENDENT_AMBULATORY_CARE_PROVIDER_SITE_OTHER): Payer: Medicare Other | Admitting: Family Medicine

## 2021-01-18 ENCOUNTER — Other Ambulatory Visit: Payer: Self-pay

## 2021-01-18 ENCOUNTER — Encounter: Payer: Self-pay | Admitting: Family Medicine

## 2021-01-18 VITALS — BP 130/74

## 2021-01-18 DIAGNOSIS — R1084 Generalized abdominal pain: Secondary | ICD-10-CM | POA: Diagnosis not present

## 2021-01-18 DIAGNOSIS — R112 Nausea with vomiting, unspecified: Secondary | ICD-10-CM | POA: Diagnosis not present

## 2021-01-18 MED ORDER — HYDROCODONE-ACETAMINOPHEN 5-325 MG PO TABS: ORAL_TABLET | ORAL | 0 refills | Status: DC

## 2021-01-18 NOTE — Progress Notes (Signed)
   Subjective:    Patient ID: [name], female    DOB: December 01, 1923, 87 y.o.   MRN: [id number]  HPI Pt having sparatic abdominal pain. When episode happens, pain will be worse for hours. Pt will then vomit. Happens about every 4-6 weeks. Pt states she has also been constipated. Pt did try antacid at one time. Pt wanting to know if something else could be given instead of Hydrocodone, since Hydrocodone may be possibly causing constipation   Relates spells of abd pain with some nausea See previous CT scan Occurs occasionally during the day Sometimes bad enough to vomit Vomiting will help  Thenm it will settle down to some degree Review of Systems     Objective:   Physical Exam Lungs clear heart regular abdomen is soft no guarding or rebound extremities no edema       Assessment & Plan:   Intermittent severe abdominal pain Hold off on CAT scan currently Could be constipation issues Could also be partial intermittent small bowel obstruction If it becomes progressive will need CT scan Recheck in 1 month Tried Colace and MiraLAX for bowel movements   Patient also has severe hip pain cannot take anti-inflammatories we discussed options including hydrocodone versus tramadol She did not feel tramadol is helping her any We will stick with hydrocodone but I do not want to go to oxycodone

## 2021-01-26 ENCOUNTER — Telehealth: Payer: Self-pay | Admitting: *Deleted

## 2021-01-26 NOTE — Chronic Care Management (AMB) (Signed)
  Chronic Care Management   Note  01/26/2021 Name: [name] MRN: [id number] DOB: Sep 23, 1923  [name] is a 87 y.o. year old female who is a primary care patient of Luking, Elayne Snare, MD. I reached out to [employer] by phone today in response to a referral sent by [name]'s PCP, Dr. Wolfgang Phoenix.      [name] was given information about Chronic Care Management services today including:  CCM service includes personalized support from designated clinical staff supervised by her physician, including individualized plan of care and coordination with other care providers 24/7 contact phone numbers for assistance for urgent and routine care needs. Service will only be billed when office clinical staff spend 20 minutes or more in a month to coordinate care. Only one practitioner may furnish and bill the service in a calendar month. The patient may stop CCM services at any time (effective at the end of the month) by phone call to the office staff. The patient will be responsible for cost sharing (co-pay) of up to 20% of the service fee (after annual deductible is met).  Patient agreed to services and verbal consent obtained.   Follow up plan: Telephone appointment with care management team member scheduled for:02/01/21  [place] [facility]  Direct Dial: [phone]

## 2021-02-01 ENCOUNTER — Ambulatory Visit (INDEPENDENT_AMBULATORY_CARE_PROVIDER_SITE_OTHER): Payer: Medicare Other | Admitting: Pharmacist

## 2021-02-01 DIAGNOSIS — I4811 Longstanding persistent atrial fibrillation: Secondary | ICD-10-CM

## 2021-02-01 DIAGNOSIS — R7301 Impaired fasting glucose: Secondary | ICD-10-CM

## 2021-02-01 DIAGNOSIS — I10 Essential (primary) hypertension: Secondary | ICD-10-CM

## 2021-02-01 DIAGNOSIS — E785 Hyperlipidemia, unspecified: Secondary | ICD-10-CM

## 2021-02-01 NOTE — Patient Instructions (Signed)
[name],  It was great to talk to you today!  Please call me with any questions or concerns.   Visit Information   PATIENT GOALS:   Goals Addressed             This Visit's Progress    Medication Management       Patient Goals/Self-Care Activities Over the next 90 days, patient will:  Take medications as prescribed Check blood pressure at least once daily, document, and provide at future appointments        Consent to CCM Services: Ms. Romo was given information about Chronic Care Management services including:  CCM service includes personalized support from designated clinical staff supervised by her physician, including individualized plan of care and coordination with other care providers 24/7 contact phone numbers for assistance for urgent and routine care needs. Service will only be billed when office clinical staff spend 20 minutes or more in a month to coordinate care. Only one practitioner may furnish and bill the service in a calendar month. The patient may stop CCM services at any time (effective at the end of the month) by phone call to the office staff. The patient will be responsible for cost sharing (co-pay) of up to 20% of the service fee (after annual deductible is met).  Patient agreed to services and verbal consent obtained.   Patient verbalizes understanding of instructions provided today and agrees to view in [place].   Face to Face appointment with care management team member scheduled for: 02/05/21  Kennon Holter, PharmD Clinical Pharmacist [place] [phone]  CLINICAL CARE PLAN: Patient Care Plan: Medication Management     Problem Identified: Afib, Hypertension, Hyperlipidemia, Pre-Diabetes   Priority: High  Onset Date: 02/01/2021     Long-Range Goal: Disease Progressive Prevention   Start Date: 02/01/2021  Expected End Date: 05/02/2021  This Visit's Progress: On track  Priority: High  Note:    Current Barriers:  Unable to independently monitor therapeutic efficacy  Pharmacist Clinical Goal(s):  Over the next 90 days, patient will Achieve adherence to monitoring guidelines and medication adherence to achieve therapeutic efficacy through collaboration with PharmD and provider.   Interventions: 1:1 collaboration with [name], MD regarding development and update of comprehensive plan of care as evidenced by provider attestation and co-signature Inter-disciplinary care team collaboration (see longitudinal plan of care) Comprehensive medication review performed; medication list updated in electronic medical record  Pre-diabetes: Controlled, last A1c 6.0 Current medications:  none Current meal patterns: not discussed today Current exercise: not active Continue to monitor A1c and fasting blood glucose Watch carbohydrate intake  Hypertension: Blood pressure under good control. Blood pressure is at goal of <130/80 mmHg per 2017 AHA/ACC guidelines. Current medications: lisinopril 2.5 mg by mouth once daily and torsemide 20 mg once daily (takes potassium 10 mEq once daily with torsemide) Denies dizziness. Current home blood pressure: not discussed today Encourage dietary sodium restriction/DASH diet Recommend home blood pressure monitoring to discuss at next visit Discussed need for medication compliance Continue current medications as above; although caution with loop diuretic given that it could cause dehydration and overdiuresis which may increase serum creatinine and decrease glomerular filtration rate  Hyperlipidemia: Controlled. LDL at goal of <100 due to high risk given at least 2 major CV risk factors (hypertension and chronic kidney disease (CKD)) and 10-year risk 10-20% per 2020 AACE/ACE guidelines and TG at goal of <150 per 2020 AACE/ACE guidelines. Current medications:  none Encourage dietary reduction of high  fat containing foods such as butter, nuts, bacon, egg  yolks, etc. Given age and current lipid levels, no medications indicated at this time  Atrial Fibrillation: Controlled. Most recent ECG: normal sinus. Current rate control: metoprolol succinate 50 mg by mouth once daily and diltiazem 120 mg by mouth once daily Anticoagulation: not indicated at present given history of subarachnoid hemorrhage and gastrointestinal bleed CHADS2VASc score: 4 - Age (2 points), Female sex (1 point), and Hypertension history (1 point) Denies signs and symptoms of bleeding Current home blood pressure: not discussed today Recommend home blood pressure and heart rate monitoring to discuss at next visit Continue current rate control regimen  Patient Goals/Self-Care Activities Over the next 90 days, patient will:  Take medications as prescribed Check blood pressure at least once daily, document, and provide at future appointments  Follow Up Plan: Face to Face appointment with care management team member scheduled for: 02/05/21

## 2021-02-01 NOTE — Chronic Care Management (AMB) (Signed)
Chronic Care Management Pharmacy Note  02/01/2021 Name:  [name] MRN:  [id number] DOB:  [birth year]  Summary: Patient unable to complete medication reconciliation over the phone. Will have patient bring in medications to make sure she has all the medications she needs. She uses pill packaging (adherence packaging) service.  Subjective: [name] is an 87 y.o. year old female who is a primary patient of Luking, Elayne Snare, MD.  The CCM team was consulted for assistance with disease management and care coordination needs.    Engaged with patient by telephone for initial visit in response to provider referral for pharmacy case management and/or care coordination services.   Consent to Services:  The patient was given the following information about Chronic Care Management services today, agreed to services, and gave verbal consent: 1. CCM service includes personalized support from designated clinical staff supervised by the primary care provider, including individualized plan of care and coordination with other care providers 2. 24/7 contact phone numbers for assistance for urgent and routine care needs. 3. Service will only be billed when office clinical staff spend 20 minutes or more in a month to coordinate care. 4. Only one practitioner may furnish and bill the service in a calendar month. 5.The patient may stop CCM services at any time (effective at the end of the month) by phone call to the office staff. 6. The patient will be responsible for cost sharing (co-pay) of up to 20% of the service fee (after annual deductible is met). Patient agreed to services and consent obtained.  Patient Care Team: [name], MD as PCP - General (Family Medicine) Beryle Lathe, [facility] (Pharmacist)  Objective:  Lab Results  Component Value Date   CREATININE 1.33 (H) 12/01/2020   CREATININE 1.66 (H) 08/31/2020   CREATININE 1.19 (H) 11/09/2019    Lab Results  Component Value Date    HGBA1C 6.0 (H) 11/06/2019   Last diabetic Eye exam: No results found for: HMDIABEYEEXA  Last diabetic Foot exam: No results found for: HMDIABFOOTEX      Component Value Date/Time   CHOL 185 07/18/2017 0357   TRIG 139 07/18/2017 0357   HDL 72 07/18/2017 0357   CHOLHDL 2.6 07/18/2017 0357   VLDL 28 07/18/2017 0357   LDLCALC 85 07/18/2017 0357    Hepatic Function Latest Ref Rng & Units 08/31/2020 11/07/2019 11/06/2019  Total Protein 6.0 - 8.5 g/dL 6.4 6.0(L) 8.4(H)  Albumin 3.5 - 4.6 g/dL 4.3 3.3(L) 4.5  AST 0 - 40 IU/L _0 ALT 0 - 32 IU/L _1 Alk Phosphatase 44 - 121 IU/L 66 49 64  Total Bilirubin 0.0 - 1.2 mg/dL 0.2 0.6 0.8  Bilirubin, Direct 0.00 - 0.40 mg/dL - - -    Lab Results  Component Value Date/Time   TSH 1.580 12/01/2020 12:17 PM   TSH 1.870 08/31/2020 03:44 PM   FREET4 0.95 09/12/2016 03:36 PM   FREET4 1.03 01/29/2012 11:28 AM    CBC Latest Ref Rng & Units 08/31/2020 11/09/2019 11/07/2019  WBC 3.4 - 10.8 x10E3/uL 7.9 7.0 10.5  Hemoglobin 11.1 - 15.9 g/dL 11.1 10.9(L) 10.6(L)  Hematocrit 34.0 - 46.6 % 33.8(L) 33.9(L) 33.6(L)  Platelets 150 - 450 x10E3/uL 237 220 239    No results found for: VD25OH  Clinical ASCVD: No  The ASCVD Risk score (Arnett DK, et al., 2019) failed to calculate for the following reasons:   The 2019 ASCVD risk score is only valid  for ages 84 to 23    Social History   Tobacco Use  Smoking Status Never  Smokeless Tobacco Never   BP Readings from Last 3 Encounters:  01/18/21 130/74  12/01/20 (!) 121/52  09/14/20 134/65   Pulse Readings from Last 3 Encounters:  12/01/20 80  09/14/20 66  08/31/20 61   Wt Readings from Last 3 Encounters:  05/08/20 140 lb (63.5 kg)  04/27/20 145 lb (65.8 kg)  01/20/20 140 lb (63.5 kg)    Assessment: Review of patient past medical history, allergies, medications, health status, including review of consultants reports, laboratory and other test data, was performed as part of  comprehensive evaluation and provision of chronic care management services.   SDOH:  (Social Determinants of Health) assessments and interventions performed:    CCM Care Plan  Allergies  Allergen Reactions   Amiodarone Itching   Cephalosporins Itching   Meloxicam Itching   Robaxin [Methocarbamol] Itching   Amoxicillin Hives   Lovenox [Enoxaparin Sodium] Nausea And Vomiting   Morphine    Morphine And Related Itching   Penicillins Swelling        Sulfa Antibiotics Swelling   Lipitor [Atorvastatin] Other (See Comments)    Unknown:patient is not aware of this allergy   Lorazepam Other (See Comments)    Exceptional fatigue     Medications Reviewed Today     Reviewed by Beryle Lathe, [facility] (Pharmacist) on 02/01/21 at 1332  Med List Status: <None>   Medication Order Taking? Sig Documenting Provider Last Dose Status Informant  ALPRAZolam (XANAX) 0.25 MG tablet [id number] No TAKE 1 TABLET BY MOUTH TWICE DAILY AS NEEDED FOR ANXIETY OR SLEEP.  Patient not taking: Reported on 02/01/2021   [name], MD Not Taking Active            Med Note Rhea Belton Feb 01, 2021  1:19 PM) Takes at bedtime for sleep  carbamide peroxide (DEBROX) 6.5 % OTIC solution [id number] No Place 5 drops into both ears 2 (two) times daily as needed (as needed for ear wax build up).  Patient not taking: Reported on 02/01/2021   Scot Jun, FNP Not Taking Active   diltiazem (CARDIZEM CD) 120 MG 24 hr capsule [id number] Yes TAKE (1) CAPSULE BY MOUTH ONCE DAILY. [name], MD Taking Active   HYDROcodone-acetaminophen (NORCO/VICODIN) 5-325 MG tablet [id number] Yes 1/2 to 1 q 4 hours prn use sparingly [name], MD Taking Active            Med Note Rhea Belton Feb 01, 2021  1:13 PM) Only takes as needed. Patient reports that it "doesn't do much good" but causes constipation  levothyroxine (SYNTHROID) 75 MCG tablet [id number] Yes Take 1 tablet (75 mcg  total) by mouth daily. [name], MD Taking Active   lisinopril (ZESTRIL) 2.5 MG tablet [id number] Yes Take 1 tablet (2.5 mg total) by mouth daily. [name], MD Taking Active   metoprolol succinate (TOPROL-XL) 50 MG 24 hr tablet [id number] Yes Take with or immediately following a meal. Luking, Elayne Snare, MD Taking Active   Multiple Vitamin (MULTIVITAMIN) tablet [id number] Yes Take 1 tablet by mouth daily. [provider] Taking Active Multiple Informants  ondansetron (ZOFRAN ODT) 8 MG disintegrating tablet [id number] No Take 1 tablet (8 mg total) by mouth every 8 (eight) hours as needed for nausea or vomiting.  Patient not taking: Reported on 02/01/2021   [name],  MD Not Taking Active   pantoprazole (PROTONIX) 40 MG tablet [id number] Yes Take 1 tablet (40 mg total) by mouth daily. [name], MD Taking Active   potassium chloride (KLOR-CON) 10 MEQ tablet [id number] Yes Take 1 tablet (10 mEq total) by mouth daily. [name], MD Taking Active   QC ACETAMINOPHEN 8 HOURS 650 MG CR tablet [id number] Yes Take 1 tablet by mouth in the morning and at bedtime. [provider] Taking Active Multiple Informants  sertraline (ZOLOFT) 50 MG tablet [id number] Yes Take 1 tablet (50 mg total) by mouth daily. [name], MD Taking Active   torsemide (DEMADEX) 20 MG tablet [id number] Yes TAKE ONE TABLET BY MOUTH ONCE EVERY MORNING. [name], MD Taking Active   vitamin B-12 1000 MCG tablet [id number] Yes Take 1 tablet (1,000 mcg total) by mouth daily. Mary Sella, NP Taking Active Multiple Informants  Med List Note Modena Morrow 06/22/18 2151): Patient uses [employer]             Patient Active Problem List   Diagnosis Date Noted   SBO (small bowel obstruction) ([place]) 11/07/2019   Nausea and vomiting 11/07/2019   Hyperglycemia 11/07/2019   Right hip pain 08/01/2019   GI bleed 02/16/2019   HLD (hyperlipidemia) 07/18/2017    B12 deficiency 07/18/2017   SAH (subarachnoid hemorrhage) ([place]) 07/17/2017   ICH (intracerebral hemorrhage) ([place]) 07/16/2017   Cervical disc disease 05/09/2017   Hypothyroid 05/09/2017   Atrial fibrillation ([place]) 01/12/2016   Osteoarthritis of right knee 12/15/2015   Major depression 09/22/2015   Ventral hernia 05/21/2015   Ovarian tumor 04/13/2015   Squamous cell skin cancer 08/13/2014   Impaired fasting glucose 08/05/2013   Neuropathy 01/11/2013   Long term (current) use of anticoagulants 07/27/2012   Anxiety 01/29/2012   GERD (gastroesophageal reflux disease) 01/29/2012   Chronic anticoagulation 01/29/2012   Sciatica of right side 05/10/2011    Immunization History  Administered Date(s) Administered   Fluad Quad(high Dose 65+) 02/04/2019, 02/29/2020   Influenza Split 03/18/2013   Influenza,inj,Quad PF,6+ Mos 03/06/2015, 03/17/2016, 02/23/2017, 02/13/2018   Influenza-Unspecified 04/21/2012, 03/19/2014, 02/04/2019   Moderna Sars-Covid-2 Vaccination 07/03/2019, 07/31/2019   Pneumococcal Conjugate-13 12/27/2013   Pneumococcal Polysaccharide-23 03/21/2004    Conditions to be addressed/monitored: Atrial Fibrillation, HTN, HLD, and pre-diabetes  Care Plan : Medication Management  Updates made by Beryle Lathe, [place] since 02/01/2021 12:00 AM     Problem: Afib, Hypertension, Hyperlipidemia, Pre-Diabetes   Priority: High  Onset Date: 02/01/2021     Long-Range Goal: Disease Progressive Prevention   Start Date: 02/01/2021  Expected End Date: 05/02/2021  This Visit's Progress: On track  Priority: High  Note:   Current Barriers:  Unable to independently monitor therapeutic efficacy  Pharmacist Clinical Goal(s):  Over the next 90 days, patient will Achieve adherence to monitoring guidelines and medication adherence to achieve therapeutic efficacy through collaboration with PharmD and provider.   Interventions: 1:1 collaboration with [name], MD regarding  development and update of comprehensive plan of care as evidenced by provider attestation and co-signature Inter-disciplinary care team collaboration (see longitudinal plan of care) Comprehensive medication review performed; medication list updated in electronic medical record  Pre-diabetes: Controlled, last A1c 6.0 Current medications:  none Current meal patterns: not discussed today Current exercise: not active Continue to monitor A1c and fasting blood glucose Watch carbohydrate intake  Hypertension: Blood pressure under good control. Blood pressure is at goal of <130/80 mmHg per  2017 AHA/ACC guidelines. Current medications: lisinopril 2.5 mg by mouth once daily and torsemide 20 mg once daily (takes potassium 10 mEq once daily with torsemide) Denies dizziness. Current home blood pressure: not discussed today Encourage dietary sodium restriction/DASH diet Recommend home blood pressure monitoring to discuss at next visit Discussed need for medication compliance Continue current medications as above; although caution with loop diuretic given that it could cause dehydration and overdiuresis which may increase serum creatinine and decrease glomerular filtration rate  Hyperlipidemia: Controlled. LDL at goal of <100 due to high risk given at least 2 major CV risk factors (hypertension and chronic kidney disease (CKD)) and 10-year risk 10-20% per 2020 AACE/ACE guidelines and TG at goal of <150 per 2020 AACE/ACE guidelines. Current medications:  none Encourage dietary reduction of high fat containing foods such as butter, nuts, bacon, egg yolks, etc. Given age and current lipid levels, no medications indicated at this time  Atrial Fibrillation: Controlled. Most recent ECG: normal sinus. Current rate control: metoprolol succinate 50 mg by mouth once daily and diltiazem 120 mg by mouth once daily Anticoagulation: not indicated at present given history of subarachnoid hemorrhage and  gastrointestinal bleed CHADS2VASc score: 4 - Age (2 points), Female sex (1 point), and Hypertension history (1 point) Denies signs and symptoms of bleeding Current home blood pressure: not discussed today Recommend home blood pressure and heart rate monitoring to discuss at next visit Continue current rate control regimen  Patient Goals/Self-Care Activities Over the next 90 days, patient will:  Take medications as prescribed Check blood pressure at least once daily, document, and provide at future appointments  Follow Up Plan: Face to Face appointment with care management team member scheduled for: 02/05/21      Medication Assistance: None required.  Patient affirms current coverage meets needs.  Patient's preferred pharmacy is:  [facility], [place] [address] Phone: [phone] Fax: [phone]  Uses adherence packaging service through local pharmacy  Follow Up:  Patient agrees to Care Plan and Follow-up.  Plan: Face to Face appointment with care management team member scheduled for: 02/05/21  Kennon Holter, PharmD Clinical Pharmacist [place] [phone]

## 2021-02-05 ENCOUNTER — Ambulatory Visit: Payer: Medicare Other | Admitting: Pharmacist

## 2021-02-05 ENCOUNTER — Other Ambulatory Visit: Payer: Self-pay

## 2021-02-05 VITALS — BP 123/56 | HR 60

## 2021-02-05 DIAGNOSIS — I10 Essential (primary) hypertension: Secondary | ICD-10-CM

## 2021-02-05 DIAGNOSIS — I4811 Longstanding persistent atrial fibrillation: Secondary | ICD-10-CM

## 2021-02-05 DIAGNOSIS — R7301 Impaired fasting glucose: Secondary | ICD-10-CM

## 2021-02-05 DIAGNOSIS — E785 Hyperlipidemia, unspecified: Secondary | ICD-10-CM

## 2021-02-05 NOTE — Patient Instructions (Signed)
[name],  It was great to talk to you today!  Please call me with any questions or concerns.   Visit Information  PATIENT GOALS:  Goals Addressed             This Visit's Progress    Medication Management       Patient Goals/Self-Care Activities Over the next 90 days, patient will:  Take medications as prescribed Check blood pressure at least once daily, document, and provide at future appointments         Patient verbalizes understanding of instructions provided today and agrees to view in [place].   Telephone follow up appointment with care management team member scheduled for:03/05/21  .Kennon Holter, PharmD Clinical Pharmacist [place] [phone]

## 2021-02-05 NOTE — Chronic Care Management (AMB) (Signed)
Chronic Care Management Pharmacy Note  02/05/2021 Name:  [name] MRN:  [id number] DOB:  04-04-1924  Summary: thorough medication reconciliation was completed today as patient brought in her medication adherence packaging from her pharmacy. No acute adjustments needed at this time. Will call patient in 1 month to check in.  Subjective: [name] is an 87 y.o. year old female who is a primary patient of Luking, Elayne Snare, MD.  The CCM team was consulted for assistance with disease management and care coordination needs.  Her son Richardson Landry was also present during the visit today.  Engaged with patient face to face for follow up visit in response to provider referral for pharmacy case management and/or care coordination services.   Consent to Services:  The patient was given information about Chronic Care Management services, agreed to services, and gave verbal consent prior to initiation of services.  Please see initial visit note for detailed documentation.   Patient Care Team: [name], MD as PCP - General (Family Medicine) Beryle Lathe, [facility] (Pharmacist)  Objective:  Lab Results  Component Value Date   CREATININE 1.33 (H) 12/01/2020   CREATININE 1.66 (H) 08/31/2020   CREATININE 1.19 (H) 11/09/2019    Lab Results  Component Value Date   HGBA1C 6.0 (H) 11/06/2019   Last diabetic Eye exam: No results found for: HMDIABEYEEXA  Last diabetic Foot exam: No results found for: HMDIABFOOTEX      Component Value Date/Time   CHOL 185 07/18/2017 0357   TRIG 139 07/18/2017 0357   HDL 72 07/18/2017 0357   CHOLHDL 2.6 07/18/2017 0357   VLDL 28 07/18/2017 0357   LDLCALC 85 07/18/2017 0357    Hepatic Function Latest Ref Rng & Units 08/31/2020 11/07/2019 11/06/2019  Total Protein 6.0 - 8.5 g/dL 6.4 6.0(L) 8.4(H)  Albumin 3.5 - 4.6 g/dL 4.3 3.3(L) 4.5  AST 0 - 40 IU/L $Remov'19 17 25  'XxJpTs$ ALT 0 - 32 IU/L $Remov'12 12 17  'pePmwT$ Alk Phosphatase 44 - 121 IU/L 66 49 64  Total Bilirubin 0.0  - 1.2 mg/dL 0.2 0.6 0.8  Bilirubin, Direct 0.00 - 0.40 mg/dL - - -    Lab Results  Component Value Date/Time   TSH 1.580 12/01/2020 12:17 PM   TSH 1.870 08/31/2020 03:44 PM   FREET4 0.95 09/12/2016 03:36 PM   FREET4 1.03 01/29/2012 11:28 AM    CBC Latest Ref Rng & Units 08/31/2020 11/09/2019 11/07/2019  WBC 3.4 - 10.8 x10E3/uL 7.9 7.0 10.5  Hemoglobin 11.1 - 15.9 g/dL 11.1 10.9(L) 10.6(L)  Hematocrit 34.0 - 46.6 % 33.8(L) 33.9(L) 33.6(L)  Platelets 150 - 450 x10E3/uL 237 220 239    No results found for: VD25OH  Clinical ASCVD: No  The ASCVD Risk score (Arnett DK, et al., 2019) failed to calculate for the following reasons:   The 2019 ASCVD risk score is only valid for ages 74 to 52    Social History   Tobacco Use  Smoking Status Never  Smokeless Tobacco Never   BP Readings from Last 3 Encounters:  02/05/21 (!) 123/56  01/18/21 130/74  12/01/20 (!) 121/52   Pulse Readings from Last 3 Encounters:  02/05/21 60  12/01/20 80  09/14/20 66   Wt Readings from Last 3 Encounters:  05/08/20 140 lb (63.5 kg)  04/27/20 145 lb (65.8 kg)  01/20/20 140 lb (63.5 kg)    Assessment: Review of patient past medical history, allergies, medications, health status, including review of consultants reports, laboratory  and other test data, was performed as part of comprehensive evaluation and provision of chronic care management services.   SDOH:  (Social Determinants of Health) assessments and interventions performed:    CCM Care Plan  Allergies  Allergen Reactions   Amiodarone Itching   Cephalosporins Itching   Meloxicam Itching   Robaxin [Methocarbamol] Itching   Amoxicillin Hives   Lovenox [Enoxaparin Sodium] Nausea And Vomiting   Morphine    Morphine And Related Itching   Penicillins Swelling        Sulfa Antibiotics Swelling   Lipitor [Atorvastatin] Other (See Comments)    Unknown:patient is not aware of this allergy   Lorazepam Other (See Comments)    Exceptional  fatigue     Medications Reviewed Today     Reviewed by Beryle Lathe, [facility] (Pharmacist) on 02/05/21 at [place] List Status: <None>   Medication Order Taking? Sig Documenting Provider Last Dose Status Informant  ALPRAZolam (XANAX) 0.25 MG tablet [id number] Yes TAKE 1 TABLET BY MOUTH TWICE DAILY AS NEEDED FOR ANXIETY OR SLEEP. [name], MD Taking Active            Med Note Rhea Belton Feb 01, 2021  1:19 PM) Takes at bedtime for sleep  carbamide peroxide (DEBROX) 6.5 % OTIC solution [id number] No Place 5 drops into both ears 2 (two) times daily as needed (as needed for ear wax build up).  Patient not taking: No sig reported   Scot Jun, FNP Not Taking Active   diltiazem (CARDIZEM CD) 120 MG 24 hr capsule [id number] Yes TAKE (1) CAPSULE BY MOUTH ONCE DAILY. [name], MD Taking Active   HYDROcodone-acetaminophen (NORCO/VICODIN) 5-325 MG tablet [id number] Yes 1/2 to 1 q 4 hours prn use sparingly [name], MD Taking Active            Med Note Rhea Belton Feb 01, 2021  1:13 PM) Only takes as needed. Patient reports that it "doesn't do much good" but causes constipation  levothyroxine (SYNTHROID) 75 MCG tablet [id number] Yes Take 1 tablet (75 mcg total) by mouth daily. [name], MD Taking Active   lisinopril (ZESTRIL) 2.5 MG tablet [id number] Yes Take 1 tablet (2.5 mg total) by mouth daily. [name], MD Taking Active   metoprolol succinate (TOPROL-XL) 50 MG 24 hr tablet [id number] Yes Take with or immediately following a meal. Luking, Elayne Snare, MD Taking Active   Multiple Vitamin (MULTIVITAMIN) tablet [id number] Yes Take 1 tablet by mouth daily. [provider] Taking Active Multiple Informants  ondansetron (ZOFRAN ODT) 8 MG disintegrating tablet [id number] Yes Take 1 tablet (8 mg total) by mouth every 8 (eight) hours as needed for nausea or vomiting. [name], MD Taking Active   pantoprazole (PROTONIX) 40  MG tablet [id number] Yes Take 1 tablet (40 mg total) by mouth daily. [name], MD Taking Active   potassium chloride (KLOR-CON) 10 MEQ tablet [id number] Yes Take 1 tablet (10 mEq total) by mouth daily. [name], MD Taking Active   QC ACETAMINOPHEN 8 HOURS 650 MG CR tablet [id number] Yes Take 1 tablet by mouth in the morning and at bedtime. [provider] Taking Active Multiple Informants  sertraline (ZOLOFT) 50 MG tablet [id number] Yes Take 1 tablet (50 mg total) by mouth daily. [name], MD Taking Active   torsemide (DEMADEX) 20 MG tablet [id number] Yes TAKE ONE TABLET BY MOUTH ONCE EVERY MORNING.  [name], MD Taking Active   vitamin B-12 1000 MCG tablet [id number] Yes Take 1 tablet (1,000 mcg total) by mouth daily. Mary Sella, NP Taking Active Multiple Informants  Med List Note [place] Morrow 06/22/18 2151): Patient uses [employer] [facility] [employer]             Patient Active Problem List   Diagnosis Date Noted   SBO (small bowel obstruction) ([place]) 11/07/2019   Nausea and vomiting 11/07/2019   Hyperglycemia 11/07/2019   Right hip pain 08/01/2019   GI bleed 02/16/2019   HLD (hyperlipidemia) 07/18/2017   B12 deficiency 07/18/2017   SAH (subarachnoid hemorrhage) ([place]) 07/17/2017   ICH (intracerebral hemorrhage) ([place]) 07/16/2017   Cervical disc disease 05/09/2017   Hypothyroid 05/09/2017   Atrial fibrillation ([place]) 01/12/2016   Osteoarthritis of right knee 12/15/2015   Major depression 09/22/2015   Ventral hernia 05/21/2015   Ovarian tumor 04/13/2015   Squamous cell skin cancer 08/13/2014   Impaired fasting glucose 08/05/2013   Neuropathy 01/11/2013   Long term (current) use of anticoagulants 07/27/2012   Anxiety 01/29/2012   GERD (gastroesophageal reflux disease) 01/29/2012   Chronic anticoagulation 01/29/2012   Sciatica of right side 05/10/2011    Immunization History  Administered Date(s) Administered   Fluad Quad(high  Dose 65+) 02/04/2019, 02/29/2020   Influenza Split 03/18/2013   Influenza,inj,Quad PF,6+ Mos 03/06/2015, 03/17/2016, 02/23/2017, 02/13/2018   Influenza-Unspecified 04/21/2012, 03/19/2014, 02/04/2019   Moderna Sars-Covid-2 Vaccination 07/03/2019, 07/31/2019   Pneumococcal Conjugate-13 12/27/2013   Pneumococcal Polysaccharide-23 03/21/2004    Conditions to be addressed/monitored: Atrial Fibrillation, HTN, HLD, and pre-diabetes  Care Plan : Medication Management  Updates made by Beryle Lathe, [place] since 02/05/2021 12:00 AM     Problem: Afib, Hypertension, Hyperlipidemia, Pre-Diabetes   Priority: High  Onset Date: 02/01/2021     Long-Range Goal: Disease Progressive Prevention   Start Date: 02/01/2021  Expected End Date: 05/02/2021  Recent Progress: On track  Priority: High  Note:   Current Barriers:  Unable to independently monitor therapeutic efficacy  Pharmacist Clinical Goal(s):  Over the next 90 days, patient will Achieve adherence to monitoring guidelines and medication adherence to achieve therapeutic efficacy through collaboration with PharmD and provider.   Interventions: 1:1 collaboration with [name], MD regarding development and update of comprehensive plan of care as evidenced by provider attestation and co-signature Inter-disciplinary care team collaboration (see longitudinal plan of care) Comprehensive medication review performed; medication list updated in electronic medical record  Pre-diabetes: Controlled, last A1c 6.0 Current medications:  none Current meal patterns: beverages: water and occasionally ginger ale and sweet tea; Current exercise: not active Continue to monitor A1c and fasting blood glucose Watch carbohydrate intake  Hypertension: Blood pressure under good control. Blood pressure is at goal of <130/80 mmHg per 2017 AHA/ACC guidelines. Current medications: lisinopril 2.5 mg by mouth once daily, diltiazem 120 mg by mouth once  daily, metoprolol succinate 50 mg by mouth once daily, and torsemide 20 mg once daily (takes potassium 10 mEq once daily with torsemide) Denies dizziness, lightheadedness, palpitations, and fainting. Current home blood pressure: checks occasionally but is unsure of readings. Will discuss more in the future Encourage dietary sodium restriction/DASH diet Recommend home blood pressure monitoring to discuss at next visit Discussed need for medication compliance Continue current medications as above  Hyperlipidemia: Controlled. LDL at goal of <100 due to high risk given at least 2 major CV risk factors (hypertension and chronic kidney disease (CKD)) and 10-year risk  10-20% per 2020 AACE/ACE guidelines and TG at goal of <150 per 2020 AACE/ACE guidelines. Current medications:  none Encourage dietary reduction of high fat containing foods such as butter, nuts, bacon, egg yolks, etc. Given age and current lipid levels, no medications indicated at this time  Atrial Fibrillation: Controlled. Most recent ECG: normal sinus. Patient denies any recent palpitations Current rate control: metoprolol succinate 50 mg by mouth once daily and diltiazem 120 mg by mouth once daily Anticoagulation: not indicated at present given history of subarachnoid hemorrhage and gastrointestinal bleed CHADS2VASc score: 4 - Age (2 points), Female sex (1 point), and Hypertension history (1 point) Denies signs and symptoms of bleeding Recommend home blood pressure and heart rate monitoring to discuss at next visit Continue current rate control regimen  Patient Goals/Self-Care Activities Over the next 90 days, patient will:  Take medications as prescribed Check blood pressure at least once daily, document, and provide at future appointments  Follow Up Plan: Telephone follow up appointment with care management team member scheduled for: 03/05/21      Medication Assistance: None required.  Patient affirms current coverage  meets needs.  Patient's preferred pharmacy is:  [facility], [address] [place] [address] Phone: [phone] Fax: [phone]  Uses pharmacy adherence packaging  Follow Up:  Patient agrees to Care Plan and Follow-up.  Plan: Telephone follow up appointment with care management team member scheduled for:  03/05/21  Kennon Holter, PharmD Clinical Pharmacist [place] [phone]

## 2021-02-08 ENCOUNTER — Encounter: Payer: Self-pay | Admitting: Family Medicine

## 2021-02-09 ENCOUNTER — Other Ambulatory Visit: Payer: Self-pay

## 2021-02-09 ENCOUNTER — Encounter (HOSPITAL_COMMUNITY): Payer: Self-pay

## 2021-02-09 ENCOUNTER — Emergency Department (HOSPITAL_COMMUNITY): Payer: Medicare Other

## 2021-02-09 ENCOUNTER — Emergency Department (HOSPITAL_COMMUNITY)
Admission: EM | Admit: 2021-02-09 | Discharge: 2021-02-09 | Disposition: A | Discharge status: Home / Self Care | Payer: Medicare Other | Attending: Emergency Medicine | Admitting: Emergency Medicine

## 2021-02-09 DIAGNOSIS — E039 Hypothyroidism, unspecified: Secondary | ICD-10-CM | POA: Insufficient documentation

## 2021-02-09 DIAGNOSIS — M545 Low back pain, unspecified: Secondary | ICD-10-CM | POA: Diagnosis not present

## 2021-02-09 DIAGNOSIS — Z79899 Other long term (current) drug therapy: Secondary | ICD-10-CM | POA: Insufficient documentation

## 2021-02-09 DIAGNOSIS — Z853 Personal history of malignant neoplasm of breast: Secondary | ICD-10-CM | POA: Diagnosis not present

## 2021-02-09 DIAGNOSIS — Z7982 Long term (current) use of aspirin: Secondary | ICD-10-CM | POA: Diagnosis not present

## 2021-02-09 DIAGNOSIS — I10 Essential (primary) hypertension: Secondary | ICD-10-CM | POA: Diagnosis not present

## 2021-02-09 DIAGNOSIS — R52 Pain, unspecified: Secondary | ICD-10-CM

## 2021-02-09 DIAGNOSIS — B9689 Other specified bacterial agents as the cause of diseases classified elsewhere: Secondary | ICD-10-CM | POA: Diagnosis not present

## 2021-02-09 DIAGNOSIS — N390 Urinary tract infection, site not specified: Secondary | ICD-10-CM | POA: Diagnosis not present

## 2021-02-09 DIAGNOSIS — K5900 Constipation, unspecified: Secondary | ICD-10-CM | POA: Insufficient documentation

## 2021-02-09 DIAGNOSIS — M549 Dorsalgia, unspecified: Secondary | ICD-10-CM

## 2021-02-09 DIAGNOSIS — R0902 Hypoxemia: Secondary | ICD-10-CM | POA: Diagnosis not present

## 2021-02-09 DIAGNOSIS — R0689 Other abnormalities of breathing: Secondary | ICD-10-CM | POA: Diagnosis not present

## 2021-02-09 DIAGNOSIS — R000 Tachycardia, unspecified: Secondary | ICD-10-CM | POA: Diagnosis not present

## 2021-02-09 DIAGNOSIS — R109 Unspecified abdominal pain: Secondary | ICD-10-CM | POA: Diagnosis not present

## 2021-02-09 DIAGNOSIS — G8929 Other chronic pain: Secondary | ICD-10-CM

## 2021-02-09 LAB — URINALYSIS, MICROSCOPIC (REFLEX)

## 2021-02-09 LAB — COMPREHENSIVE METABOLIC PANEL WITH GFR
ALT: 14 U/L (ref 0–44)
AST: 22 U/L (ref 15–41)
Albumin: 4.2 g/dL (ref 3.5–5.0)
Alkaline Phosphatase: 63 U/L (ref 38–126)
Anion gap: 12 (ref 5–15)
BUN: 28 mg/dL — ABNORMAL HIGH (ref 8–23)
CO2: 22 mmol/L (ref 22–32)
Calcium: 8.9 mg/dL (ref 8.9–10.3)
Chloride: 105 mmol/L (ref 98–111)
Creatinine, Ser: 1.54 mg/dL — ABNORMAL HIGH (ref 0.44–1.00)
GFR, Estimated: 31 mL/min — ABNORMAL LOW
Glucose, Bld: 115 mg/dL — ABNORMAL HIGH (ref 70–99)
Potassium: 3.6 mmol/L (ref 3.5–5.1)
Sodium: 139 mmol/L (ref 135–145)
Total Bilirubin: 0.8 mg/dL (ref 0.3–1.2)
Total Protein: 7.4 g/dL (ref 6.5–8.1)

## 2021-02-09 LAB — CBC WITH DIFFERENTIAL/PLATELET
Abs Immature Granulocytes: 0.05 10*3/uL (ref 0.00–0.07)
Basophils Absolute: 0 10*3/uL (ref 0.0–0.1)
Basophils Relative: 0 %
Eosinophils Absolute: 0.1 10*3/uL (ref 0.0–0.5)
Eosinophils Relative: 1 %
HCT: 35.3 % — ABNORMAL LOW (ref 36.0–46.0)
Hemoglobin: 11.5 g/dL — ABNORMAL LOW (ref 12.0–15.0)
Immature Granulocytes: 1 %
Lymphocytes Relative: 20 %
Lymphs Abs: 1.9 10*3/uL (ref 0.7–4.0)
MCH: 30.2 pg (ref 26.0–34.0)
MCHC: 32.6 g/dL (ref 30.0–36.0)
MCV: 92.7 fL (ref 80.0–100.0)
Monocytes Absolute: 0.6 10*3/uL (ref 0.1–1.0)
Monocytes Relative: 7 %
Neutro Abs: 6.4 10*3/uL (ref 1.7–7.7)
Neutrophils Relative %: 71 %
Platelets: 279 10*3/uL (ref 150–400)
RBC: 3.81 MIL/uL — ABNORMAL LOW (ref 3.87–5.11)
RDW: 12 % (ref 11.5–15.5)
WBC: 9.1 10*3/uL (ref 4.0–10.5)
nRBC: 0 % (ref 0.0–0.2)

## 2021-02-09 LAB — URINALYSIS, ROUTINE W REFLEX MICROSCOPIC
Bilirubin Urine: NEGATIVE
Glucose, UA: NEGATIVE mg/dL
Hgb urine dipstick: NEGATIVE
Ketones, ur: NEGATIVE mg/dL
Nitrite: POSITIVE — AB
Protein, ur: NEGATIVE mg/dL
Specific Gravity, Urine: 1.01 (ref 1.005–1.030)
pH: 8.5 — ABNORMAL HIGH (ref 5.0–8.0)

## 2021-02-09 MED ORDER — IOHEXOL 350 MG/ML SOLN
60.0000 mL | Freq: Once | INTRAVENOUS | Status: AC | PRN
  Administered 2021-02-09: 60 mL via INTRAVENOUS

## 2021-02-09 MED ORDER — NITROFURANTOIN MONOHYD MACRO 100 MG PO CAPS: 100.0000 mg | ORAL_CAPSULE | Freq: Two times a day (BID) | ORAL | 0 refills | Status: DC

## 2021-02-09 NOTE — ED Notes (Signed)
Pt returned from CT °

## 2021-02-09 NOTE — Discharge Instructions (Signed)
Continue to use MiraLAX, 3 times a day until you have a bowel movement.  Then decrease to twice a day for 1 week, then once a day for 1 week.  Try to eat foods which contain more fiber and drink plenty of water.  We sent a prescription for an antibiotic to treat a UTI, to your pharmacy.  Start taking it as soon as possible.

## 2021-02-09 NOTE — ED Notes (Signed)
Pt to CT

## 2021-02-09 NOTE — ED Provider Notes (Signed)
[facility] EMERGENCY DEPARTMENT Provider Note   CSN: [id number] Arrival date & time: 02/09/21  1043     History Chief Complaint  Patient presents with   Hypertension    [name] is a 87 y.o. female.  HPI She presents for evaluation of abdominal pain, back pain and high blood pressure.  She states she checked it at home and it was "high."  She cannot remember what the number was.  She states that when EMS got there after she called, they told her blood pressure was high.  She has been taking Norco for chronic back pain up until a few days ago.  She then began to be constipated so stopped taking it.  She is using an unknown stool softener without relief.  Her PCP was contacted today for instructions regarding treatment of constipation.  This was done by her granddaughter.  The patient denies nausea, vomiting, fever, chills, cough or shortness of breath.  She states that she lives alone and manages her own affairs.  There are no other known active modifying factors.    Past Medical History:  Diagnosis Date   Abdominal pain, chronic, right lower quadrant    "ever since I had my appendix removed"   Atrial fibrillation ([place])    Breast cancer ([place])    Cancer ([place])    left side   Chronic back pain    Edema    GERD (gastroesophageal reflux disease)    Hyperlipidemia    Hypertension    Osteopenia    Osteoporosis    Ovarian cyst 02/2015   left   Recurrent abdominal pain    LLQ    Patient Active Problem List   Diagnosis Date Noted   SBO (small bowel obstruction) ([place]) 11/07/2019   Nausea and vomiting 11/07/2019   Hyperglycemia 11/07/2019   Right hip pain 08/01/2019   GI bleed 02/16/2019   HLD (hyperlipidemia) 07/18/2017   B12 deficiency 07/18/2017   SAH (subarachnoid hemorrhage) ([place]) 07/17/2017   ICH (intracerebral hemorrhage) ([place]) 07/16/2017   Cervical disc disease 05/09/2017   Hypothyroid 05/09/2017   Atrial fibrillation ([place]) 01/12/2016   Osteoarthritis of  right knee 12/15/2015   Major depression 09/22/2015   Ventral hernia 05/21/2015   Ovarian tumor 04/13/2015   Squamous cell skin cancer 08/13/2014   Impaired fasting glucose 08/05/2013   Neuropathy 01/11/2013   Long term (current) use of anticoagulants 07/27/2012   Anxiety 01/29/2012   GERD (gastroesophageal reflux disease) 01/29/2012   Chronic anticoagulation 01/29/2012   Sciatica of right side 05/10/2011    Past Surgical History:  Procedure Laterality Date   APPENDECTOMY     APPENDECTOMY     BREAST SURGERY Left    lumpectomy   CARDIOVERSION N/A 03/31/2016   Procedure: CARDIOVERSION;  Surgeon: Josue Hector, MD;  Location: [facility];  Service: Cardiovascular;  Laterality: N/A;   COLON RESECTION     COLON SURGERY     HERNIA REPAIR     HERNIA REPAIR       OB History     Gravida  2   Para  1   Term      Preterm      AB  1   Living  1      SAB  1   IAB      Ectopic      Multiple      Live Births              Family History  Problem Relation Age of Onset   Diabetes Mother    Heart disease Other    Arthritis Other    Cancer Other    Hyperlipidemia Brother     Social History   Tobacco Use   Smoking status: Never   Smokeless tobacco: Never  Vaping Use   Vaping Use: Never used  Substance Use Topics   Alcohol use: No    Alcohol/week: 0.0 standard drinks   Drug use: No    Home Medications Prior to Admission medications   Medication Sig Start Date End Date Taking? Authorizing Provider  ALPRAZolam (XANAX) 0.25 MG tablet TAKE 1 TABLET BY MOUTH TWICE DAILY AS NEEDED FOR ANXIETY OR SLEEP. 01/11/21  Yes [name], MD  aspirin EC 81 MG tablet Take 81 mg by mouth daily. Swallow whole.   Yes [provider]  diltiazem (CARDIZEM CD) 120 MG 24 hr capsule TAKE (1) CAPSULE BY MOUTH ONCE DAILY. 12/01/20  Yes [name], MD  HYDROcodone-acetaminophen (NORCO/VICODIN) 5-325 MG tablet 1/2 to 1 q 4 hours prn use sparingly 01/18/21  Yes Luking,  Elayne Snare, MD  levothyroxine (SYNTHROID) 75 MCG tablet Take 1 tablet (75 mcg total) by mouth daily. 12/01/20  Yes Luking, Elayne Snare, MD  lisinopril (ZESTRIL) 2.5 MG tablet Take 1 tablet (2.5 mg total) by mouth daily. 09/10/20  Yes [name], MD  metoprolol succinate (TOPROL-XL) 50 MG 24 hr tablet Take with or immediately following a meal. 12/01/20  Yes Luking, Elayne Snare, MD  Multiple Vitamin (MULTIVITAMIN) tablet Take 1 tablet by mouth daily.   Yes [provider]  nitrofurantoin, macrocrystal-monohydrate, (MACROBID) 100 MG capsule Take 1 capsule (100 mg total) by mouth 2 (two) times daily. 02/09/21  Yes Daleen Bo, MD  pantoprazole (PROTONIX) 40 MG tablet Take 1 tablet (40 mg total) by mouth daily. 12/01/20  Yes Luking, Elayne Snare, MD  potassium chloride (KLOR-CON) 10 MEQ tablet Take 1 tablet (10 mEq total) by mouth daily. 12/01/20  Yes Luking, Elayne Snare, MD  QC ACETAMINOPHEN 8 HOURS 650 MG CR tablet Take 1 tablet by mouth in the morning and at bedtime. 10/23/19  Yes [provider]  sertraline (ZOLOFT) 50 MG tablet Take 1 tablet (50 mg total) by mouth daily. 12/01/20  Yes [name], MD  torsemide (DEMADEX) 20 MG tablet TAKE ONE TABLET BY MOUTH ONCE EVERY MORNING. 01/12/21  Yes Luking, Elayne Snare, MD  vitamin B-12 1000 MCG tablet Take 1 tablet (1,000 mcg total) by mouth daily. 07/21/17  Yes Costello, Kayren Eaves, NP  carbamide peroxide (DEBROX) 6.5 % OTIC solution Place 5 drops into both ears 2 (two) times daily as needed (as needed for ear wax build up). Patient not taking: No sig reported 08/31/20   Scot Jun, FNP  ondansetron (ZOFRAN ODT) 8 MG disintegrating tablet Take 1 tablet (8 mg total) by mouth every 8 (eight) hours as needed for nausea or vomiting. Patient not taking: No sig reported 12/02/20   [name], MD    Allergies    Amiodarone, Cephalosporins, Meloxicam, Robaxin [methocarbamol], Amoxicillin, Lovenox [enoxaparin sodium], Morphine, Morphine and related,  Penicillins, Sulfa antibiotics, Lipitor [atorvastatin], and Lorazepam  Review of Systems   Review of Systems  All other systems reviewed and are negative.  Physical Exam Updated Vital Signs BP (!) 173/75   Pulse 65   Temp 99.5 F (37.5 C)   Resp 20   Ht 5\' 4"  (1.626 m)   Wt 64.4 kg   SpO2  97%   BMI 24.37 kg/m   Physical Exam Vitals and nursing note reviewed.  Constitutional:      General: She is not in acute distress.    Appearance: She is well-developed. She is not ill-appearing, toxic-appearing or diaphoretic.  HENT:     Head: Normocephalic and atraumatic.     Right Ear: External ear normal.     Left Ear: External ear normal.  Eyes:     Conjunctiva/sclera: Conjunctivae normal.     Pupils: Pupils are equal, round, and reactive to light.  Neck:     Trachea: Phonation normal.  Cardiovascular:     Rate and Rhythm: Normal rate and regular rhythm.     Heart sounds: Normal heart sounds.  Pulmonary:     Effort: Pulmonary effort is normal.     Breath sounds: Normal breath sounds.  Abdominal:     General: There is no distension.     Palpations: Abdomen is soft.     Tenderness: There is no abdominal tenderness.  Musculoskeletal:        General: Normal range of motion.     Cervical back: Normal range of motion and neck supple.  Skin:    General: Skin is warm and dry.  Neurological:     Mental Status: She is alert and oriented to person, place, and time.     Cranial Nerves: No cranial nerve deficit.     Sensory: No sensory deficit.     Motor: No abnormal muscle tone.     Coordination: Coordination normal.  Psychiatric:        Mood and Affect: Mood normal.        Behavior: Behavior normal.        Thought Content: Thought content normal.        Judgment: Judgment normal.    ED Results / Procedures / Treatments   Labs (all labs ordered are listed, but only abnormal results are displayed) Labs Reviewed  COMPREHENSIVE METABOLIC PANEL - Abnormal; Notable for the  following components:      Result Value   Glucose, Bld 115 (*)    BUN 28 (*)    Creatinine, Ser 1.54 (*)    GFR, Estimated 31 (*)    All other components within normal limits  CBC WITH DIFFERENTIAL/PLATELET - Abnormal; Notable for the following components:   RBC 3.81 (*)    Hemoglobin 11.5 (*)    HCT 35.3 (*)    All other components within normal limits  URINALYSIS, ROUTINE W REFLEX MICROSCOPIC - Abnormal; Notable for the following components:   APPearance HAZY (*)    pH 8.5 (*)    Nitrite POSITIVE (*)    Leukocytes,Ua MODERATE (*)    All other components within normal limits  URINALYSIS, MICROSCOPIC (REFLEX) - Abnormal; Notable for the following components:   Bacteria, UA MANY (*)    Non Squamous Epithelial PRESENT (*)    All other components within normal limits  URINE CULTURE    EKG None  Radiology CT Abdomen Pelvis W Contrast  Result Date: 02/09/2021 CLINICAL DATA:  Abdominal pain. Abscess suspected. History of breast cancer. EXAM: CT ABDOMEN AND PELVIS WITH CONTRAST TECHNIQUE: Multidetector CT imaging of the abdomen and pelvis was performed using the standard protocol following bolus administration of intravenous contrast. CONTRAST:  106mL OMNIPAQUE IOHEXOL 350 MG/ML SOLN COMPARISON:  11/06/2019 FINDINGS: Lower chest: Chronic scarring in the lower lobes. No pleural effusion. Hepatobiliary: Normal Pancreas: Large duodenal diverticulum adjacent to the pancreatic head. No pancreatic abnormality.  Spleen: Normal Adrenals/Urinary Tract: Adrenal glands are normal. Right kidney is atrophic. Left kidney contains a few small cysts. Mild fullness of the left renal collecting system and ureter without evidence of obstructing stone or bladder lesion. There is air in the bladder, presumably subsequent to catheterization. Stomach/Bowel: Stomach and small intestine are unremarkable. No obstruction. Moderate amount of fecal matter within the colon but no acute or likely significant colon finding.  Diverticulosis without evidence of diverticulitis. There is a low midline ventral hernia which does contain a few loops of small intestine but there is no sign of obstruction or incarceration because of this. Vascular/Lymphatic: Aortic atherosclerosis. No aneurysm. IVC is normal. No retroperitoneal adenopathy. Reproductive: No pelvic mass. Other: No free fluid or air. Musculoskeletal: Chronic lumbar degenerative changes as described at lumbar MRI. IMPRESSION: No evidence of small-bowel obstruction presently. Patient has a low ventral midline hernia that contains small bowel, but there is no evidence of obstruction or incarceration. Atrophic right kidney. The renal pelvis and ureter on the left are mildly dilated, not seen in 2021, but there is no visible passing stone or obstructing lesion in the bladder. The patient could possibly have passed a recent stone on the left. Aortic Atherosclerosis (ICD10-I70.0). Electronically Signed   By: Nelson Chimes M.D.   On: 02/09/2021 13:27   CT L-SPINE NO CHARGE  Result Date: 02/09/2021 CLINICAL DATA:  Abdominal and back pain beginning today. History of breast cancer. EXAM: CT LUMBAR SPINE WITHOUT CONTRAST TECHNIQUE: Multidetector CT imaging of the lumbar spine was performed without intravenous contrast administration. Multiplanar CT image reconstructions were also generated. COMPARISON:  None. FINDINGS: Segmentation: L5 is sacralized, as numbered previously. Alignment: 4 mm degenerative anterolisthesis L3-4. Vertebrae: No fracture or focal bone lesion. Paraspinal and other soft tissues: See results of abdominal CT. Disc levels: No acute or significant disc level pathology at T12-L1 or above. L1-2: Bulging of the disc. Bilateral facet hypertrophy. Moderate multifactorial stenosis. L2-3: Bulging of the disc. Pronounced facet and ligamentous hypertrophy. Severe multifactorial spinal stenosis. L3-4: Chronic facet arthropathy with 4 mm of anterolisthesis. Bulging of the disc.  Severe multifactorial spinal stenosis. L4-5: Disc degeneration with loss of disc height. Endplate osteophytes and bulging of the disc. Facet and ligamentous hypertrophy left worse than right. Narrowing of the lateral recesses and foramina, left more than right. L5-S1: Transitional and unremarkable. IMPRESSION: L5 is sacralized, consistent with the same numbering terminology utilized on the recent MRI. No acute finding. Chronic severe multifactorial spinal stenosis at L2-3 and L3-4 that could be symptomatic. Lateral recess and foraminal narrowing at L1-2 and L4-5 that could also be painful. Findings appear similar to the MRI study done in November of last year. Electronically Signed   By: Nelson Chimes M.D.   On: 02/09/2021 13:20    Procedures Procedures   Medications Ordered in ED Medications  iohexol (OMNIPAQUE) 350 MG/ML injection 60 mL (60 mLs Intravenous Contrast Given 02/09/21 1230)    ED Course  I have reviewed the triage vital signs and the nursing notes.  Pertinent labs & imaging results that were available during my care of the patient were reviewed by me and considered in my medical decision making (see chart for details).    MDM Rules/Calculators/A&P                            Patient Vitals for the past 24 hrs:  BP Temp Pulse Resp SpO2 Height Weight  02/09/21 1359 (!) 173/75 --  65 20 97 % -- --  02/09/21 1300 (!) 162/65 -- 65 16 96 % -- --  02/09/21 1200 (!) 154/58 -- 60 13 92 % -- --  02/09/21 1130 (!) 170/62 -- 68 (!) 23 96 % -- --  02/09/21 1100 (!) 166/67 -- 69 (!) 23 96 % -- --  02/09/21 1057 (!) 155/77 -- -- -- -- -- --  02/09/21 1051 -- -- -- -- -- 5\' 4"  (1.626 m) 64.4 kg  02/09/21 1049 -- 99.5 F (37.5 C) 70 18 100 % -- --    2:48 PM Reevaluation with update and discussion. After initial assessment and treatment, an updated evaluation reveals patient is comfortable.  Patient's son is in the room now.  We discussed the findings.  Patient reports she is currently  using MiraLAX, and has had 3 doses but no bowel movements in several days.  Patient states that sometimes she sees colors, when looking far away, but it is intermittent.  She denies headache at this time.  She states that she is having urinary frequency at this time.  Therefore we will initiate treatment for UTI.  Will order urine culture.  Findings discussed with patient and her son, all questions were answered. Daleen Bo   Medical Decision Making:  This patient is presenting for evaluation of abdominal pain and back pain with elevated blood pressure, which does require a range of treatment options, and is a complaint that involves a moderate risk of morbidity and mortality. The differential diagnoses include constipation, enteritis, vascular disorder, degenerative spine disorder. I decided to review old records, and in summary elderly female with ongoing chronic back pain, degenerative spine disease, history of intracerebral hemorrhage and small bowel obstruction.  I did not require additional historical information from anyone.  Clinical Laboratory Tests Ordered, included CBC, Metabolic panel, and Urinalysis. Review indicates urinalysis abnormal, glucose high, BUN high, creatinine high, hemoglobin low. Radiologic Tests Ordered, included CT abdomen pelvis.  I independently Visualized: Radiographic images, which show no definite acute abnormalities, increased colonic stool, possibly recently passed left-sided stone    Critical Interventions-clinical evaluation, laboratory testing, radiologic imaging, observation and reassessment  After These Interventions, the Patient was reevaluated and was found stable for discharge.  She is presenting with nonspecific symptoms, and had comprehensive evaluation indicating probable urinary tract infection, renal insufficiency, and anemia.  CT imaging with lumbar imaging of abdomen does not show serious problems;   Expected mild degenerative changes and  constipation.  Blood pressure mildly elevated, no clinical evidence for hypertensive urgency.  Suspect urinary tract infection, antibiotics will be started, and culture sent.  Patient is stable for outpatient management.  CRITICAL CARE-no Performed by: Daleen Bo  Nursing Notes Reviewed/ Care Coordinated Applicable Imaging Reviewed Interpretation of Laboratory Data incorporated into ED treatment  The patient appears reasonably screened and/or stabilized for discharge and I doubt any other medical condition or other [facility] requiring further screening, evaluation, or treatment in the ED at this time prior to discharge.  Plan: Home Medications-increase MiraLAX to 3 times daily, as needed, continue current medications; Home Treatments-increase oral fluids and fiber; return here if the recommended treatment, does not improve the symptoms; Recommended follow up-PCP 1 week and as needed.     Final Clinical Impression(s) / ED Diagnoses Final diagnoses:  Pain  Constipation, unspecified constipation type  Urinary tract infection without hematuria, site unspecified  Chronic back pain, unspecified back location, unspecified back pain laterality    Rx / DC Orders ED Discharge Orders  Ordered    nitrofurantoin, macrocrystal-monohydrate, (MACROBID) 100 MG capsule  2 times daily        02/09/21 1452             Daleen Bo, MD 02/09/21 2020

## 2021-02-09 NOTE — ED Triage Notes (Signed)
Pt arrives via [facility] after calling out for hypertension, abdominal pain, and lower back pain.

## 2021-02-10 ENCOUNTER — Other Ambulatory Visit: Payer: Self-pay | Admitting: Family Medicine

## 2021-02-11 ENCOUNTER — Telehealth: Payer: Self-pay | Admitting: Family Medicine

## 2021-02-11 NOTE — Telephone Encounter (Signed)
Patient has appointment 9/26 but has been constipated for 5-6 days. Took Miralax but did not help. Asking for something that will work better.  [place]   CB#  [phone]

## 2021-02-11 NOTE — Telephone Encounter (Signed)
So message patient where [name] did not work it is more of a matter of having to use more MiraLAX  I would recommend 1 scoop full twice daily with water 8 ounces each time I also recommend consideration for family to do a fleets enema Also take OTC Senokot daily until results  If having severe abdominal pain vomiting or getting worse may need to go back to the ER for enema/irrigation/further work-up

## 2021-02-11 NOTE — Telephone Encounter (Signed)
I would recommend minimizing medication such as Xanax and Vicodin and try to stay away from those is much as possible.  We will discuss further at her follow-up visit

## 2021-02-11 NOTE — Telephone Encounter (Signed)
Results discussed with patient. Patient advised per Dr Nicki Reaper: Dr Nicki Reaper would recommend 1 scoop full twice daily with water 8 ounces each time And also recommend consideration for family to do a fleets enema Also take OTC Senokot daily until results   If having severe abdominal pain vomiting or getting worse may need to go back to the ER for enema/irrigation/further work-up  Patient verbalized understanding.  Patient states it also worries her that sometimes she see things that she knows is not there or real- like she can look at her wall and see pink and blue flowers all over it but she knows that is not on her wall. She said he told the ER doctor about it and he told her he didn't know what to say and ask PCP

## 2021-02-11 NOTE — Telephone Encounter (Signed)
Patient notified and verbalized understanding. 

## 2021-02-12 LAB — URINE CULTURE: Culture: >=100000 — AB

## 2021-02-14 ENCOUNTER — Telehealth: Payer: Self-pay | Admitting: Emergency Medicine

## 2021-02-14 NOTE — Telephone Encounter (Signed)
Post ED Visit - Positive Culture Follow-up  Culture report reviewed by antimicrobial stewardship pharmacist: [place] Team []  Elenor Quinones, Pharm.D. []  Heide Guile, Pharm.D., BCPS AQ-ID []  Parks Neptune, Pharm.D., BCPS []  Alycia Rossetti, Pharm.D., BCPS []  Elliott, Pharm.D., BCPS, AAHIVP []  Legrand Como, Pharm.D., BCPS, AAHIVP []  Salome Arnt, PharmD, BCPS []  Johnnette Gourd, PharmD, BCPS []  Hughes Better, PharmD, BCPS [x]  Lorelei Pont, PharmD []  Laqueta Linden, PharmD, BCPS []  Albertina Parr, PharmD  [place] Team []  Leodis Sias, PharmD []  Lindell Spar, PharmD []  Royetta Asal, PharmD []  Graylin Shiver, Rph []  Rema Fendt) Glennon Mac, PharmD []  Arlyn Dunning, PharmD []  Netta Cedars, PharmD []  Dia Sitter, PharmD []  Leone Haven, PharmD []  Gretta Arab, PharmD []  Theodis Shove, PharmD []  Peggyann Juba, PharmD []  Reuel Boom, PharmD   Positive urine culture Treated with Nitrofurantoin, organism sensitive to the same and no further patient follow-up is required at this time.  Sandi Raveling Joanette Silveria 02/14/2021, 11:52 AM

## 2021-02-15 ENCOUNTER — Ambulatory Visit (INDEPENDENT_AMBULATORY_CARE_PROVIDER_SITE_OTHER): Payer: Medicare Other | Admitting: Family Medicine

## 2021-02-15 ENCOUNTER — Other Ambulatory Visit: Payer: Self-pay

## 2021-02-15 VITALS — BP 138/72 | Ht 63.0 in

## 2021-02-15 DIAGNOSIS — Z23 Encounter for immunization: Secondary | ICD-10-CM | POA: Diagnosis not present

## 2021-02-15 DIAGNOSIS — R443 Hallucinations, unspecified: Secondary | ICD-10-CM | POA: Diagnosis not present

## 2021-02-15 DIAGNOSIS — K5900 Constipation, unspecified: Secondary | ICD-10-CM

## 2021-02-15 DIAGNOSIS — M25552 Pain in left hip: Secondary | ICD-10-CM

## 2021-02-15 DIAGNOSIS — M25551 Pain in right hip: Secondary | ICD-10-CM

## 2021-02-15 NOTE — Progress Notes (Signed)
   Subjective:    Patient ID: [name], female    DOB: 28-Jun-1923, 87 y.o.   MRN: [id number]  HPI  Patient arrives for a follow up on abdominal pain. Patient states she was really constipated but is finally feeling better and no longer constipated.  Patient also states her hips still cause her pain  Patient states at times she sees things on the wall that she is knows is not there at the time she was seeing people in her house that she knows is not there this only lasts for a few moments then it goes away she denies feeling scared from it denies any seizures denies any dementia issues she recently was advised to stop taking Xanax and hydrocodone because of the symptoms as well as constipation Review of Systems     Objective:   Physical Exam  Lungs clear heart regular HEENT benign subjective hip pain and discomfort low back pain and discomfort abdomen soft      Assessment & Plan:  Constipation May use MiraLAX as needed stay away from pain medicine  Hip pain recommend Tylenol if ongoing troubles or worsening issues may need orthopedic referral stay away from anti-inflammatories because of risk of bleeding ulcers  Intermittent hallucinations I am concerned that this could be something that comes with age and increased dementia but currently not showing dementia Will monitor closely  Follow-up in 2 to 3 months sooner problems

## 2021-02-19 DIAGNOSIS — I4811 Longstanding persistent atrial fibrillation: Secondary | ICD-10-CM

## 2021-02-19 DIAGNOSIS — I10 Essential (primary) hypertension: Secondary | ICD-10-CM | POA: Diagnosis not present

## 2021-02-19 DIAGNOSIS — E785 Hyperlipidemia, unspecified: Secondary | ICD-10-CM | POA: Diagnosis not present

## 2021-03-05 ENCOUNTER — Ambulatory Visit (INDEPENDENT_AMBULATORY_CARE_PROVIDER_SITE_OTHER): Payer: Medicare Other | Admitting: Pharmacist

## 2021-03-05 DIAGNOSIS — I10 Essential (primary) hypertension: Secondary | ICD-10-CM

## 2021-03-05 DIAGNOSIS — E785 Hyperlipidemia, unspecified: Secondary | ICD-10-CM

## 2021-03-05 DIAGNOSIS — I4811 Longstanding persistent atrial fibrillation: Secondary | ICD-10-CM

## 2021-03-05 DIAGNOSIS — R7301 Impaired fasting glucose: Secondary | ICD-10-CM

## 2021-03-05 NOTE — Chronic Care Management (AMB) (Signed)
Chronic Care Management Pharmacy Note  03/05/2021 Name:  [name] MRN:  [id number] DOB:  28-Aug-1923  Summary:  Patient reports improvement in bowel movements with use of as needed Miralax. Patient reports completion of antibiotics for UTI and denies burning/itching with urination. Patient reports improvement in hallucinations since discontinuing opioid and benzodiazepine. Has not had hallucinations in 2 days. No other acute concerns at this time. Will follow-up in 3 months.  Subjective: [name] is an 87 y.o. year old female who is a primary patient of Luking, Elayne Snare, MD.  The CCM team was consulted for assistance with disease management and care coordination needs.    Engaged with patient by telephone for follow up visit in response to provider referral for pharmacy case management and/or care coordination services.   Consent to Services:  The patient was given information about Chronic Care Management services, agreed to services, and gave verbal consent prior to initiation of services.  Please see initial visit note for detailed documentation.   Patient Care Team: [name], MD as PCP - General (Family Medicine) Beryle Lathe, [facility] (Pharmacist)  Objective:  Lab Results  Component Value Date   CREATININE 1.54 (H) 02/09/2021   CREATININE 1.33 (H) 12/01/2020   CREATININE 1.66 (H) 08/31/2020    Lab Results  Component Value Date   HGBA1C 6.0 (H) 11/06/2019   Last diabetic Eye exam: No results found for: HMDIABEYEEXA  Last diabetic Foot exam: No results found for: HMDIABFOOTEX      Component Value Date/Time   CHOL 185 07/18/2017 0357   TRIG 139 07/18/2017 0357   HDL 72 07/18/2017 0357   CHOLHDL 2.6 07/18/2017 0357   VLDL 28 07/18/2017 0357   LDLCALC 85 07/18/2017 0357    Hepatic Function Latest Ref Rng & Units 02/09/2021 08/31/2020 11/07/2019  Total Protein 6.5 - 8.1 g/dL 7.4 6.4 6.0(L)  Albumin 3.5 - 5.0 g/dL 4.2 4.3 3.3(L)  AST 15 - 41  U/L _0 ALT 0 - 44 U/L _1 Alk Phosphatase 38 - 126 U/L 63 66 49  Total Bilirubin 0.3 - 1.2 mg/dL 0.8 0.2 0.6  Bilirubin, Direct 0.00 - 0.40 mg/dL - - -    Lab Results  Component Value Date/Time   TSH 1.580 12/01/2020 12:17 PM   TSH 1.870 08/31/2020 03:44 PM   FREET4 0.95 09/12/2016 03:36 PM   FREET4 1.03 01/29/2012 11:28 AM    CBC Latest Ref Rng & Units 02/09/2021 08/31/2020 11/09/2019  WBC 4.0 - 10.5 K/uL 9.1 7.9 7.0  Hemoglobin 12.0 - 15.0 g/dL 11.5(L) 11.1 10.9(L)  Hematocrit 36.0 - 46.0 % 35.3(L) 33.8(L) 33.9(L)  Platelets 150 - 400 K/uL 279 237 220    No results found for: VD25OH  Clinical ASCVD: No  The ASCVD Risk score (Arnett DK, et al., 2019) failed to calculate for the following reasons:   The 2019 ASCVD risk score is only valid for ages 93 to 29    Social History   Tobacco Use  Smoking Status Never  Smokeless Tobacco Never   BP Readings from Last 3 Encounters:  02/15/21 138/72  02/09/21 (!) 164/97  02/05/21 (!) 123/56   Pulse Readings from Last 3 Encounters:  02/09/21 70  02/05/21 60  12/01/20 80   Wt Readings from Last 3 Encounters:  02/09/21 142 lb (64.4 kg)  05/08/20 140 lb (63.5 kg)  04/27/20 145 lb (65.8 kg)    Assessment: Review of patient past medical  history, allergies, medications, health status, including review of consultants reports, laboratory and other test data, was performed as part of comprehensive evaluation and provision of chronic care management services.   SDOH:  (Social Determinants of Health) assessments and interventions performed:    CCM Care Plan  Allergies  Allergen Reactions   Amiodarone Itching   Cephalosporins Itching   Meloxicam Itching   Robaxin [Methocarbamol] Itching   Amoxicillin Hives   Lovenox [Enoxaparin Sodium] Nausea And Vomiting   Morphine    Morphine And Related Itching   Penicillins Swelling        Sulfa Antibiotics Swelling   Lipitor [Atorvastatin] Other (See Comments)     Unknown:patient is not aware of this allergy   Lorazepam Other (See Comments)    Exceptional fatigue     Medications Reviewed Today     Reviewed by Beryle Lathe, [facility] (Pharmacist) on 03/05/21 at 0907  Med List Status: <None>   Medication Order Taking? Sig Documenting Provider Last Dose Status Informant  aspirin EC 81 MG tablet [id number] Yes Take 81 mg by mouth daily. Swallow whole. [provider] Taking Active Self  carbamide peroxide (DEBROX) 6.5 % OTIC solution [id number] No Place 5 drops into both ears 2 (two) times daily as needed (as needed for ear wax build up).  Patient not taking: No sig reported   Scot Jun, FNP Not Taking Active Self  diltiazem (CARDIZEM CD) 120 MG 24 hr capsule [id number] Yes TAKE (1) CAPSULE BY MOUTH ONCE DAILY. [name], MD Taking Active Self  levothyroxine (SYNTHROID) 75 MCG tablet [id number] Yes Take 1 tablet (75 mcg total) by mouth daily. [name], MD Taking Active Self  lisinopril (ZESTRIL) 2.5 MG tablet [id number] Yes Take 1 tablet (2.5 mg total) by mouth daily. [name], MD Taking Active Self  metoprolol succinate (TOPROL-XL) 50 MG 24 hr tablet [id number] Yes Take with or immediately following a meal. Luking, Elayne Snare, MD Taking Active Self  Multiple Vitamin (MULTIVITAMIN) tablet [id number] Yes Take 1 tablet by mouth daily. [provider] Taking Active Self  ondansetron (ZOFRAN ODT) 8 MG disintegrating tablet [id number] No Take 1 tablet (8 mg total) by mouth every 8 (eight) hours as needed for nausea or vomiting.  Patient not taking: No sig reported   [name], MD Not Taking Active Self  pantoprazole (PROTONIX) 40 MG tablet [id number] Yes Take 1 tablet (40 mg total) by mouth daily. [name], MD Taking Active Self  potassium chloride (KLOR-CON) 10 MEQ tablet [id number] Yes Take 1 tablet (10 mEq total) by mouth daily. [name], MD Taking Active Self  QC ACETAMINOPHEN 8 HOURS 650 MG CR  tablet [id number] Yes Take 1 tablet by mouth in the morning and at bedtime. [provider] Taking Active Self  sertraline (ZOLOFT) 50 MG tablet [id number] Yes Take 1 tablet (50 mg total) by mouth daily. [name], MD Taking Active Self  torsemide (DEMADEX) 20 MG tablet [id number] Yes TAKE ONE TABLET BY MOUTH ONCE EVERY MORNING. [name], MD Taking Active   vitamin B-12 1000 MCG tablet [id number] Yes Take 1 tablet (1,000 mcg total) by mouth daily. Mary Sella, NP Taking Active Self  Med List Note Modena Morrow 06/22/18 2151): Patient uses [facility] and [place]             Patient Active Problem List   Diagnosis Date Noted   SBO (small bowel obstruction) ([place]) 11/07/2019   Nausea  and vomiting 11/07/2019   Hyperglycemia 11/07/2019   Right hip pain 08/01/2019   GI bleed 02/16/2019   HLD (hyperlipidemia) 07/18/2017   B12 deficiency 07/18/2017   SAH (subarachnoid hemorrhage) ([place]) 07/17/2017   ICH (intracerebral hemorrhage) ([place]) 07/16/2017   Cervical disc disease 05/09/2017   Hypothyroid 05/09/2017   Atrial fibrillation ([place]) 01/12/2016   Osteoarthritis of right knee 12/15/2015   Major depression 09/22/2015   Ventral hernia 05/21/2015   Ovarian tumor 04/13/2015   Squamous cell skin cancer 08/13/2014   Impaired fasting glucose 08/05/2013   Neuropathy 01/11/2013   Long term (current) use of anticoagulants 07/27/2012   Anxiety 01/29/2012   GERD (gastroesophageal reflux disease) 01/29/2012   Chronic anticoagulation 01/29/2012   Sciatica of right side 05/10/2011    Immunization History  Administered Date(s) Administered   Fluad Quad(high Dose 65+) 02/04/2019, 02/29/2020, 02/15/2021   Influenza Split 03/18/2013   Influenza,inj,Quad PF,6+ Mos 03/06/2015, 03/17/2016, 02/23/2017, 02/13/2018   Influenza-Unspecified 04/21/2012, 03/19/2014, 02/04/2019   Moderna Sars-Covid-2 Vaccination 07/03/2019, 07/31/2019   Pneumococcal Conjugate-13  12/27/2013   Pneumococcal Polysaccharide-23 03/21/2004    Conditions to be addressed/monitored: Atrial Fibrillation, HTN, HLD, and pre-diabetes  Care Plan : Medication Management  Updates made by Beryle Lathe, [place] since 03/05/2021 12:00 AM     Problem: Afib, Hypertension, Hyperlipidemia, Pre-Diabetes   Priority: High  Onset Date: 02/01/2021     Long-Range Goal: Disease Progressive Prevention   Start Date: 02/01/2021  Expected End Date: 05/02/2021  Recent Progress: On track  Priority: High  Note:   Current Barriers:  Unable to independently monitor therapeutic efficacy  Pharmacist Clinical Goal(s):  Over the next 90 days, patient will Achieve adherence to monitoring guidelines and medication adherence to achieve therapeutic efficacy through collaboration with PharmD and provider.   Interventions: 1:1 collaboration with [name], MD regarding development and update of comprehensive plan of care as evidenced by provider attestation and co-signature Inter-disciplinary care team collaboration (see longitudinal plan of care) Comprehensive medication review performed; medication list updated in electronic medical record  Pre-diabetes: Controlled, last A1c 6.0 Current medications:  none Current meal patterns: beverages: water and occasionally ginger ale and sweet tea; Current exercise: not active Continue to monitor A1c and fasting blood glucose Watch carbohydrate intake  Hypertension: Blood pressure under good control. Blood pressure is at goal of <130/80 mmHg per 2017 AHA/ACC guidelines. Current medications: lisinopril 2.5 mg by mouth once daily, diltiazem 120 mg by mouth once daily, metoprolol succinate 50 mg by mouth once daily, and torsemide 20 mg once daily (takes potassium 10 mEq once daily with torsemide) Denies dizziness, lightheadedness, palpitations, and fainting. Current home blood pressure: checks occasionally but is unsure of readings. Will discuss  more in the future Encourage dietary sodium restriction/DASH diet Recommend home blood pressure monitoring to discuss at next visit Discussed need for medication compliance Continue current medications as above  Hyperlipidemia: Controlled. LDL at goal of <100 due to high risk given at least 2 major CV risk factors (hypertension and chronic kidney disease (CKD)) and 10-year risk 10-20% per 2020 AACE/ACE guidelines and TG at goal of <150 per 2020 AACE/ACE guidelines. Current medications:  none Encourage dietary reduction of high fat containing foods such as butter, nuts, bacon, egg yolks, etc. Given age and current lipid levels, no medications indicated at this time  Atrial Fibrillation: Controlled. Most recent ECG: normal sinus. Patient denies any recent palpitations Current rate control: metoprolol succinate 50 mg by mouth once daily and diltiazem 120 mg by mouth once  daily Anticoagulation: not indicated at present given history of subarachnoid hemorrhage and gastrointestinal bleed CHADS2VASc score: 4 - Age (2 points), Female sex (1 point), and Hypertension history (1 point) Denies signs and symptoms of bleeding Recommend home blood pressure and heart rate monitoring to discuss at next visit Continue current rate control regimen  Patient Goals/Self-Care Activities Over the next 90 days, patient will:  Take medications as prescribed Check blood pressure at least once daily, document, and provide at future appointments  Follow Up Plan: Telephone follow up appointment with care management team member scheduled for: 05/28/21      Medication Assistance: None required.  Patient affirms current coverage meets needs.  Patient's preferred pharmacy is:  [facility], [address] [place] [address] Phone: [phone] Fax: [phone]  Follow Up:  Patient agrees to Care Plan and Follow-up.  Plan: Telephone follow up appointment with care  management team member scheduled for:  05/28/20  Kennon Holter, PharmD Clinical Pharmacist [place] [phone]

## 2021-03-05 NOTE — Patient Instructions (Signed)
Keir P Durocher,  It was great to talk to you today!  Please call me with any questions or concerns.   Visit Information  PATIENT GOALS:  Goals Addressed             This Visit's Progress    Medication Management       Patient Goals/Self-Care Activities Over the next 90 days, patient will:  Take medications as prescribed Check blood pressure at least once daily, document, and provide at future appointments          Patient verbalizes understanding of instructions provided today and agrees to view in [place].   Telephone follow up appointment with care management team member scheduled for:05/28/20  Kennon Holter, PharmD Clinical Pharmacist [place] [phone]

## 2021-03-15 ENCOUNTER — Other Ambulatory Visit: Payer: Self-pay | Admitting: Family Medicine

## 2021-03-15 NOTE — Telephone Encounter (Signed)
I was under the impression that she was stopping the Xanax.  She was experiencing some hallucinations it was hard to know if it was due to the hydrocodone or the Xanax.  Please talk with patient.

## 2021-03-16 NOTE — Telephone Encounter (Signed)
Patient states she does not think the xanax had anything to do with her seeing things and she is no longer seeing things and needs this medication for her nerves and to help her sleep

## 2021-03-16 NOTE — Telephone Encounter (Signed)
Nurses-please see previous note

## 2021-03-22 DIAGNOSIS — I10 Essential (primary) hypertension: Secondary | ICD-10-CM

## 2021-03-22 DIAGNOSIS — I4811 Longstanding persistent atrial fibrillation: Secondary | ICD-10-CM

## 2021-03-22 DIAGNOSIS — E785 Hyperlipidemia, unspecified: Secondary | ICD-10-CM

## 2021-04-05 ENCOUNTER — Ambulatory Visit: Payer: Medicare Other | Admitting: Family Medicine

## 2021-04-09 ENCOUNTER — Other Ambulatory Visit: Payer: Self-pay | Admitting: *Deleted

## 2021-04-09 MED ORDER — LISINOPRIL 2.5 MG PO TABS: 2.5000 mg | ORAL_TABLET | Freq: Every day | ORAL | 0 refills | Status: DC

## 2021-04-14 ENCOUNTER — Other Ambulatory Visit: Payer: Self-pay | Admitting: Family Medicine

## 2021-04-23 ENCOUNTER — Other Ambulatory Visit: Payer: Self-pay

## 2021-04-23 ENCOUNTER — Ambulatory Visit (INDEPENDENT_AMBULATORY_CARE_PROVIDER_SITE_OTHER): Payer: Medicare Other | Admitting: Family Medicine

## 2021-04-23 ENCOUNTER — Encounter: Payer: Self-pay | Admitting: Family Medicine

## 2021-04-23 VITALS — BP 130/82 | Temp 97.7°F

## 2021-04-23 DIAGNOSIS — E038 Other specified hypothyroidism: Secondary | ICD-10-CM | POA: Diagnosis not present

## 2021-04-23 DIAGNOSIS — I4811 Longstanding persistent atrial fibrillation: Secondary | ICD-10-CM | POA: Diagnosis not present

## 2021-04-23 DIAGNOSIS — F419 Anxiety disorder, unspecified: Secondary | ICD-10-CM

## 2021-04-23 MED ORDER — HYDROCODONE-ACETAMINOPHEN 5-325 MG PO TABS: ORAL_TABLET | ORAL | 0 refills | Status: DC

## 2021-04-23 NOTE — Progress Notes (Signed)
   Subjective:    Patient ID: [name], female    DOB: Oct 18, 1923, 87 y.o.   MRN: [id number]  HPI Pt here with Lanny Hurst (cousin) Pt here for follow up. Pt states she is still able to go to bathroom with walker. Able to eat but still has pain some times(worse at times).  Patient does have some underlying anxiety she is on sertraline She uses Xanax intermittently But she has had some falls recently She is at increased risk of further falls It would not be wise to be on Xanax moving forward Pt left foot is swollen. Both feet and knees are painful.  Right side also hurt.  Pt states the med she was on for UTI was the cause of constipation and not pain med. Pt would like refill on pain med. Pt also needing refill on Xanax.  Pt did have fall Wednesday-pt got up by herself.   Review of Systems     Objective:   Physical Exam  Lungs are clear heart rate is controlled she does have some pedal edema more likely related to venous insufficiency on the left side right side appears normal calves are normal skin warm dry      Assessment & Plan:  Has had a history of small bowel obstruction but none currently Blood pressure issues under good control currently Thyroid takes her medicine regular basis not having trouble Osteoarthritis in the back and the hip uses hydrocodone sparingly half tablet every 6 hours as needed for infrequent use Tylenol alone does not help enough It is not safe for her to be on anti-inflammatory Follow-up again in 3 to 4 months sooner problems With her falls she should not be on anticoagulant Does take aspirin which is beneficial so far no GI bleed no labs today Pain medication sent in for sparing use

## 2021-05-14 ENCOUNTER — Other Ambulatory Visit: Payer: Self-pay | Admitting: Family Medicine

## 2021-05-28 ENCOUNTER — Ambulatory Visit (INDEPENDENT_AMBULATORY_CARE_PROVIDER_SITE_OTHER): Payer: Medicare Other | Admitting: Pharmacist

## 2021-05-28 DIAGNOSIS — I4811 Longstanding persistent atrial fibrillation: Secondary | ICD-10-CM

## 2021-05-28 DIAGNOSIS — F419 Anxiety disorder, unspecified: Secondary | ICD-10-CM

## 2021-05-28 DIAGNOSIS — E785 Hyperlipidemia, unspecified: Secondary | ICD-10-CM

## 2021-05-28 DIAGNOSIS — I10 Essential (primary) hypertension: Secondary | ICD-10-CM

## 2021-05-28 DIAGNOSIS — F320 Major depressive disorder, single episode, mild: Secondary | ICD-10-CM

## 2021-05-28 DIAGNOSIS — R7301 Impaired fasting glucose: Secondary | ICD-10-CM

## 2021-05-28 NOTE — Chronic Care Management (AMB) (Addendum)
Chronic Care Management Pharmacy Note  05/28/2021 Name:  [name] MRN:  [id number] DOB:  [birth year]  Summary: Medication list review/updated. Patient complains of foot/shoulder pain which she thinks is arthritis. States Tylenol does not help but Norco helps some for a short period of time but she only uses it sparingly. Discussed over the counter creams/gels such as Voltaren. Patient reports some relief from Aspercreme.  Patient thinks nitrofurantoin caused constipation. Added to medication allergy list as intolerance.   Subjective: [name] is an 88 y.o. year old female who is a primary patient of Luking, Elayne Snare, MD.  The CCM team was consulted for assistance with disease management and care coordination needs.    Engaged with patient by telephone for follow up visit in response to provider referral for pharmacy case management and/or care coordination services.   Consent to Services:  The patient was given information about Chronic Care Management services, agreed to services, and gave verbal consent prior to initiation of services.  Please see initial visit note for detailed documentation.   Patient Care Team: [name], MD as PCP - General (Family Medicine) Beryle Lathe, [facility] (Pharmacist)  Objective:  Lab Results  Component Value Date   CREATININE 1.54 (H) 02/09/2021   CREATININE 1.33 (H) 12/01/2020   CREATININE 1.66 (H) 08/31/2020    Lab Results  Component Value Date   HGBA1C 6.0 (H) 11/06/2019   Last diabetic Eye exam: No results found for: HMDIABEYEEXA  Last diabetic Foot exam: No results found for: HMDIABFOOTEX      Component Value Date/Time   CHOL 185 07/18/2017 0357   TRIG 139 07/18/2017 0357   HDL 72 07/18/2017 0357   CHOLHDL 2.6 07/18/2017 0357   VLDL 28 07/18/2017 0357   LDLCALC 85 07/18/2017 0357    Hepatic Function Latest Ref Rng & Units 02/09/2021 08/31/2020 11/07/2019  Total Protein 6.5 - 8.1 g/dL 7.4 6.4 6.0(L)  Albumin  3.5 - 5.0 g/dL 4.2 4.3 3.3(L)  AST 15 - 41 U/L $Remo'22 19 17  'VIaOa$ ALT 0 - 44 U/L $Remo'14 12 12  'dznkK$ Alk Phosphatase 38 - 126 U/L 63 66 49  Total Bilirubin 0.3 - 1.2 mg/dL 0.8 0.2 0.6  Bilirubin, Direct 0.00 - 0.40 mg/dL - - -    Lab Results  Component Value Date/Time   TSH 1.580 12/01/2020 12:17 PM   TSH 1.870 08/31/2020 03:44 PM   FREET4 0.95 09/12/2016 03:36 PM   FREET4 1.03 01/29/2012 11:28 AM    CBC Latest Ref Rng & Units 02/09/2021 08/31/2020 11/09/2019  WBC 4.0 - 10.5 K/uL 9.1 7.9 7.0  Hemoglobin 12.0 - 15.0 g/dL 11.5(L) 11.1 10.9(L)  Hematocrit 36.0 - 46.0 % 35.3(L) 33.8(L) 33.9(L)  Platelets 150 - 400 K/uL 279 237 220    No results found for: VD25OH  Clinical ASCVD: No  The ASCVD Risk score (Arnett DK, et al., 2019) failed to calculate for the following reasons:   The 2019 ASCVD risk score is only valid for ages 65 to 70    Social History   Tobacco Use  Smoking Status Never  Smokeless Tobacco Never   BP Readings from Last 3 Encounters:  04/23/21 130/82  02/15/21 138/72  02/09/21 (!) 164/97   Pulse Readings from Last 3 Encounters:  02/09/21 70  02/05/21 60  12/01/20 80   Wt Readings from Last 3 Encounters:  02/09/21 142 lb (64.4 kg)  05/08/20 140 lb (63.5 kg)  04/27/20 145 lb (65.8 kg)  Assessment: Review of patient past medical history, allergies, medications, health status, including review of consultants reports, laboratory and other test data, was performed as part of comprehensive evaluation and provision of chronic care management services.   SDOH:  (Social Determinants of Health) assessments and interventions performed:  SDOH Interventions    Flowsheet Row Most Recent Value  SDOH Interventions   SDOH Interventions for the Following Domains Depression  Depression Interventions/Treatment  Currently on Treatment       CCM Care Plan  Allergies  Allergen Reactions   Amiodarone Itching   Cephalosporins Itching   Meloxicam Itching   Robaxin [Methocarbamol]  Itching   Amoxicillin Hives   Lovenox [Enoxaparin Sodium] Nausea And Vomiting   Morphine    Morphine And Related Itching   Penicillins Swelling        Sulfa Antibiotics Swelling   Lipitor [Atorvastatin] Other (See Comments)    Unknown:patient is not aware of this allergy   Lorazepam Other (See Comments)    Exceptional fatigue    Macrobid [Nitrofurantoin] Other (See Comments)    Constipation    Medications Reviewed Today     Reviewed by Beryle Lathe, [facility] (Pharmacist) on 05/28/21 at [place] List Status: <None>   Medication Order Taking? Sig Documenting Provider Last Dose Status Informant  aspirin EC 81 MG tablet [id number] Yes Take 81 mg by mouth daily. Swallow whole. [provider] Taking Active Self  carbamide peroxide (DEBROX) 6.5 % OTIC solution [id number] Yes Place 5 drops into both ears 2 (two) times daily as needed (as needed for ear wax build up). Scot Jun, FNP Taking Active Self  diltiazem (CARDIZEM CD) 120 MG 24 hr capsule [id number] Yes TAKE (1) CAPSULE BY MOUTH ONCE DAILY. [name], MD Taking Active Self  HYDROcodone-acetaminophen (NORCO/VICODIN) 5-325 MG tablet [id number] Yes TAKE ONE TABLET EVERY 6 HOURS AS NEEDED FOR MODERATE PAIN Luking, Elayne Snare, MD Taking Active   levothyroxine (SYNTHROID) 75 MCG tablet [id number] Yes Take 1 tablet (75 mcg total) by mouth daily. [name], MD Taking Active Self  lisinopril (ZESTRIL) 2.5 MG tablet [id number] Yes Take 1 tablet (2.5 mg total) by mouth daily. [name], MD Taking Active   metoprolol succinate (TOPROL-XL) 50 MG 24 hr tablet [id number] Yes Take with or immediately following a meal. Luking, Elayne Snare, MD Taking Active Self  Multiple Vitamin (MULTIVITAMIN) tablet [id number] Yes Take 1 tablet by mouth daily. [provider] Taking Active Self  ondansetron (ZOFRAN ODT) 8 MG disintegrating tablet [id number] Yes Take 1 tablet (8 mg total) by mouth every 8 (eight) hours as needed for  nausea or vomiting. [name], MD Taking Active Self  pantoprazole (PROTONIX) 40 MG tablet [id number] Yes Take 1 tablet (40 mg total) by mouth daily. [name], MD Taking Active Self  polyethylene glycol powder (GLYCOLAX/MIRALAX) 17 GM/SCOOP powder [id number] Yes Take 17 g by mouth daily as needed. [provider] Taking Active   potassium chloride (KLOR-CON) 10 MEQ tablet [id number] Yes Take 1 tablet (10 mEq total) by mouth daily. [name], MD Taking Active Self  QC ACETAMINOPHEN 8 HOURS 650 MG CR tablet [id number] Yes Take 1 tablet by mouth in the morning and at bedtime. [provider] Taking Active Self  sertraline (ZOLOFT) 50 MG tablet [id number] Yes Take 1 tablet (50 mg total) by mouth daily. [name], MD Taking Active Self  torsemide (DEMADEX) 20 MG tablet [id number] Yes TAKE ONE TABLET BY MOUTH ONCE EVERY  MORNING. [name], MD Taking Active   trolamine salicylate (ASPERCREME) 10 % cream [id number] Yes Apply 1 application topically as needed (feet and shoulders). [provider] Taking Active Self  vitamin B-12 1000 MCG tablet [id number] Yes Take 1 tablet (1,000 mcg total) by mouth daily. Mary Sella, NP Taking Active Self  Med List Note [place] Morrow 06/22/18 2151): Patient uses [place] and [place]             Patient Active Problem List   Diagnosis Date Noted   SBO (small bowel obstruction) ([place]) 11/07/2019   Nausea and vomiting 11/07/2019   Hyperglycemia 11/07/2019   Right hip pain 08/01/2019   GI bleed 02/16/2019   HLD (hyperlipidemia) 07/18/2017   B12 deficiency 07/18/2017   SAH (subarachnoid hemorrhage) ([place]) 07/17/2017   ICH (intracerebral hemorrhage) ([place]) 07/16/2017   Cervical disc disease 05/09/2017   Hypothyroid 05/09/2017   Atrial fibrillation ([place]) 01/12/2016   Osteoarthritis of right knee 12/15/2015   Major depression 09/22/2015   Ventral hernia 05/21/2015   Ovarian tumor  04/13/2015   Squamous cell skin cancer 08/13/2014   Impaired fasting glucose 08/05/2013   Neuropathy 01/11/2013   Long term (current) use of anticoagulants 07/27/2012   Anxiety 01/29/2012   GERD (gastroesophageal reflux disease) 01/29/2012   Chronic anticoagulation 01/29/2012   Sciatica of right side 05/10/2011    Immunization History  Administered Date(s) Administered   Fluad Quad(high Dose 65+) 02/04/2019, 02/29/2020, 02/15/2021   Influenza Split 03/18/2013   Influenza,inj,Quad PF,6+ Mos 03/06/2015, 03/17/2016, 02/23/2017, 02/13/2018   Influenza-Unspecified 04/21/2012, 03/19/2014, 02/04/2019   Moderna Sars-Covid-2 Vaccination 07/03/2019, 07/31/2019   Pneumococcal Conjugate-13 12/27/2013   Pneumococcal Polysaccharide-23 03/21/2004    Conditions to be addressed/monitored: Atrial Fibrillation, HTN, HLD, Anxiety, Depression, and pre-diabetes  Care Plan : Medication Management  Updates made by Beryle Lathe, [place] since 05/28/2021 12:00 AM     Problem: Afib, Hypertension, Hyperlipidemia, Pre-Diabetes, Depression/Anxiety   Priority: High  Onset Date: 02/01/2021     Long-Range Goal: Disease Progressive Prevention   Start Date: 02/01/2021  Expected End Date: 05/02/2021  Recent Progress: On track  Priority: High  Note:   Current Barriers:  Unable to independently monitor therapeutic efficacy  Pharmacist Clinical Goal(s):  Over the next 90 days, patient will Achieve adherence to monitoring guidelines and medication adherence to achieve therapeutic efficacy through collaboration with PharmD and provider.   Interventions: 1:1 collaboration with [name], MD regarding development and update of comprehensive plan of care as evidenced by provider attestation and co-signature Inter-disciplinary care team collaboration (see longitudinal plan of care) Comprehensive medication review performed; medication list updated in electronic medical record  Pre-diabetes - Condition  stable. Not addressed this visit.: Controlled, last A1c 6.0 Current medications:  none Current meal patterns: beverages: water and occasionally ginger ale and sweet tea; Current exercise: not active Continue to monitor A1c and fasting blood glucose Watch carbohydrate intake  Hypertension - Condition stable. Not addressed this visit.: Blood pressure under good control. Blood pressure is at goal of <130/80 mmHg per 2017 AHA/ACC guidelines. Current medications: lisinopril 2.5 mg by mouth once daily, diltiazem 120 mg by mouth once daily, metoprolol succinate 50 mg by mouth once daily, and torsemide 20 mg once daily (takes potassium 10 mEq once daily with torsemide) Denies dizziness, lightheadedness, palpitations, and fainting. No recent falls. She uses walker.  Current home blood pressure: not discussed today Encourage dietary sodium restriction/DASH diet Recommend home blood pressure monitoring to discuss at next visit  Discussed need for medication compliance Continue current medications as above  Hyperlipidemia - Condition stable. Not addressed this visit.: Controlled. LDL at goal of <100 due to high risk given at least 2 major CV risk factors (hypertension and chronic kidney disease (CKD)) and 10-year risk 10-20% per 2020 AACE/ACE guidelines and TG at goal of <150 per 2020 AACE/ACE guidelines. Current medications:  none Encourage dietary reduction of high fat containing foods such as butter, nuts, bacon, egg yolks, etc. Given age and current lipid levels, no medications indicated at this time  Atrial Fibrillation - Condition stable. Not addressed this visit.: Controlled. Most recent ECG: normal sinus. Patient denies any recent palpitations Current rate control: metoprolol succinate 50 mg by mouth once daily and diltiazem 120 mg by mouth once daily Anticoagulation: not indicated at present given history of subarachnoid hemorrhage and gastrointestinal bleed Antiplatelet: aspirin 81 mg by  mouth daily CHADS2VASc score: 4 - Age (2 points), Female sex (1 point), and Hypertension history (1 point) Denies signs and symptoms of bleeding Recommend home blood pressure and heart rate monitoring to discuss at next visit Continue current rate control regimen  Depression and Anxiety - Condition stable. Not addressed this visit.: Controlled/stable per patient Current medications: sertraline (Zoloft)  50 mg by mouth once daily Current non-pharmacologic treatment: None Current symptoms include: impaired memory Continue current medications as above  Patient Goals/Self-Care Activities Over the next 90 days, patient will:  Take medications as prescribed Check blood pressure at least once daily, document, and provide at future appointments  Follow Up Plan: Telephone follow up appointment with care management team member scheduled for: 08/27/21      Medication Assistance: None required.  Patient affirms current coverage meets needs.  Patient's preferred pharmacy is:  [facility], [address] [place] [address] Phone: [phone] Fax: [phone]  Follow Up:  Patient agrees to Care Plan and Follow-up.  Plan: Telephone follow up appointment with care management team member scheduled for:  08/27/21  Kennon Holter, PharmD, BCACP, CPP Clinical Pharmacist Practitioner [place] [phone]

## 2021-05-28 NOTE — Patient Instructions (Signed)
[name],  It was great to talk to you today!  Please call me with any questions or concerns.   Visit Information  Following are the goals we discussed today:  Patient Goals/Self-Care Activities Over the next 90 days, patient will:  Take medications as prescribed Check blood pressure at least once daily, document, and provide at future appointments  Plan: Telephone follow up appointment with care management team member scheduled for:  08/27/21  Kennon Holter, PharmD, BCACP, CPP Clinical Pharmacist Practitioner [place] [phone]  Please call the care guide team at [phone] if you need to cancel or reschedule your appointment.   Patient verbalizes understanding of instructions provided today and agrees to view in [place].

## 2021-06-11 ENCOUNTER — Other Ambulatory Visit: Payer: Self-pay | Admitting: Family Medicine

## 2021-06-22 DIAGNOSIS — I4811 Longstanding persistent atrial fibrillation: Secondary | ICD-10-CM | POA: Diagnosis not present

## 2021-06-22 DIAGNOSIS — F320 Major depressive disorder, single episode, mild: Secondary | ICD-10-CM | POA: Diagnosis not present

## 2021-06-22 DIAGNOSIS — I10 Essential (primary) hypertension: Secondary | ICD-10-CM

## 2021-06-22 DIAGNOSIS — E785 Hyperlipidemia, unspecified: Secondary | ICD-10-CM

## 2021-06-23 ENCOUNTER — Other Ambulatory Visit: Payer: Self-pay | Admitting: Family Medicine

## 2021-07-05 DIAGNOSIS — I739 Peripheral vascular disease, unspecified: Secondary | ICD-10-CM | POA: Diagnosis not present

## 2021-07-05 DIAGNOSIS — M79671 Pain in right foot: Secondary | ICD-10-CM | POA: Diagnosis not present

## 2021-07-05 DIAGNOSIS — L609 Nail disorder, unspecified: Secondary | ICD-10-CM | POA: Diagnosis not present

## 2021-07-05 DIAGNOSIS — L110 Acquired keratosis follicularis: Secondary | ICD-10-CM | POA: Diagnosis not present

## 2021-07-08 ENCOUNTER — Other Ambulatory Visit: Payer: Self-pay | Admitting: Family Medicine

## 2021-07-13 ENCOUNTER — Other Ambulatory Visit: Payer: Self-pay | Admitting: Family Medicine

## 2021-07-14 ENCOUNTER — Telehealth: Payer: Self-pay | Admitting: Family Medicine

## 2021-07-14 ENCOUNTER — Ambulatory Visit (INDEPENDENT_AMBULATORY_CARE_PROVIDER_SITE_OTHER): Payer: Medicare Other | Admitting: Family Medicine

## 2021-07-14 ENCOUNTER — Other Ambulatory Visit: Payer: Self-pay

## 2021-07-14 DIAGNOSIS — M25552 Pain in left hip: Secondary | ICD-10-CM | POA: Diagnosis not present

## 2021-07-14 DIAGNOSIS — M25551 Pain in right hip: Secondary | ICD-10-CM | POA: Diagnosis not present

## 2021-07-14 NOTE — Progress Notes (Signed)
° °  Subjective:    Patient ID: [name], female    DOB: 11-23-1923, 88 y.o.   MRN: [id number]  HPI Pt would like to discuss Hydrocodone with PCP. Pt was placed on Hydrocodone 5-325 in December 2022. Pt states she has not had any in a while. Has been using Tylenol but it does not help the pain. Pt states she stays in bed covered up.   Virtual Visit via Telephone Note  I connected with [name] on 07/14/21 at 10:40 AM EST by telephone and verified that I am speaking with the correct person using two identifiers.  Location: Patient: home Provider: office   I discussed the limitations, risks, security and privacy concerns of performing an evaluation and management service by telephone and the availability of in person appointments. I also discussed with the patient that there may be a patient responsible charge related to this service. The patient expressed understanding and agreed to proceed.   History of Present Illness:    Observations/Objective:   Assessment and Plan:   Follow Up Instructions:    I discussed the assessment and treatment plan with the patient. The patient was provided an opportunity to ask questions and all were answered. The patient agreed with the plan and demonstrated an understanding of the instructions.   The patient was advised to call back or seek an in-person evaluation if the symptoms worsen or if the condition fails to improve as anticipated.  I provided  15 minutes of non-face-to-face time during this encounter.       Review of Systems     Objective:   Physical Exam  Today's visit was via telephone Physical exam was not possible for this visit       Assessment & Plan:  Chronic arthritis Uses Tylenol High risk for GI bleeds with NSAIDs Uses low-dose hydrocodone half tablet when necessary Prescription was sent in on February 17 Patient has follow-up office visit in about 8 days keep follow-up visit

## 2021-07-14 NOTE — Telephone Encounter (Signed)
Ms. richetta, cubillos are scheduled for a virtual visit with your provider today.    Just as we do with appointments in the office, we must obtain your consent to participate.  Your consent will be active for this visit and any virtual visit you may have with one of our providers in the next 365 days.    If you have a MyChart account, I can also send a copy of this consent to you electronically.  All virtual visits are billed to your insurance company just like a traditional visit in the office.  As this is a virtual visit, video technology does not allow for your provider to perform a traditional examination.  This may limit your provider's ability to fully assess your condition.  If your provider identifies any concerns that need to be evaluated in person or the need to arrange testing such as labs, EKG, etc, we will make arrangements to do so.    Although advances in technology are sophisticated, we cannot ensure that it will always work on either your end or our end.  If the connection with a video visit is poor, we may have to switch to a telephone visit.  With either a video or telephone visit, we are not always able to ensure that we have a secure connection.   I need to obtain your verbal consent now.   Are you willing to proceed with your visit today?   Yuko P Sundby has provided verbal consent on 07/14/2021 for a virtual visit (video or telephone).   Vicente Males, LPN 0/62/3762  83:15 AM

## 2021-07-22 ENCOUNTER — Ambulatory Visit (INDEPENDENT_AMBULATORY_CARE_PROVIDER_SITE_OTHER): Payer: Medicare Other | Admitting: Family Medicine

## 2021-07-22 ENCOUNTER — Other Ambulatory Visit: Payer: Self-pay

## 2021-07-22 ENCOUNTER — Encounter: Payer: Self-pay | Admitting: Family Medicine

## 2021-07-22 VITALS — BP 134/73 | HR 75 | Temp 98.6°F | Ht 63.0 in | Wt 146.0 lb

## 2021-07-22 DIAGNOSIS — D509 Iron deficiency anemia, unspecified: Secondary | ICD-10-CM

## 2021-07-22 DIAGNOSIS — E038 Other specified hypothyroidism: Secondary | ICD-10-CM

## 2021-07-22 DIAGNOSIS — I4811 Longstanding persistent atrial fibrillation: Secondary | ICD-10-CM | POA: Diagnosis not present

## 2021-07-22 DIAGNOSIS — M1990 Unspecified osteoarthritis, unspecified site: Secondary | ICD-10-CM

## 2021-07-22 MED ORDER — CIPROFLOXACIN HCL 250 MG PO TABS: ORAL_TABLET | ORAL | 0 refills | Status: DC

## 2021-07-22 NOTE — Progress Notes (Signed)
? ?  Subjective:  ? ? Patient ID: [name], female    DOB: 12-27-1923, 88 y.o.   MRN: [id number] ? ?HPI ?3 month follow up  ?Dysuria -chronic on and off  ?Multiple joint pain  ? ?She does relate a lot of hip pain back pain knee and joint pain Tylenol helps to some degree occasionally has to take a hydrocodone she states the hydrocodone does not cause her to feel drowsy or drugged ?Review of Systems ? ?   ?Objective:  ? Physical Exam ? ?Lungs clear heart pulse normal extremities no edema patient has had bleeding issues in the past therefore she is not on any on any type of anticoagulants currently she does take 81 mg aspirin ? ? ?   ?Assessment & Plan:  ?Significant arthralgias with advancing osteoarthritis hydrocodone for sparing use caution drowsiness ? ?Hypothyroidism continue levothyroxine ? ?Atrial fibrillation cannot be on anticoagulants currently because she has had a subarachnoid bleed as well as intracerebral hemorrhage which puts her at a higher risk of having this happen again therefore she is using 81 mg aspirin which probably is not protecting her against stroke but it is the best of a difficult situation ? ?Patient complains UTI but unable to give a urine specimen we will go ahead and treat with antibiotics ?Lab work ordered ?Follow-up in 3 to 4 months ?

## 2021-07-24 LAB — CBC WITH DIFFERENTIAL/PLATELET
Basophils Absolute: 0 10*3/uL (ref 0.0–0.2)
Basos: 0 %
EOS (ABSOLUTE): 0.2 10*3/uL (ref 0.0–0.4)
Eos: 3 %
Hematocrit: 35.9 % (ref 34.0–46.6)
Hemoglobin: 12.2 g/dL (ref 11.1–15.9)
Immature Grans (Abs): 0 10*3/uL (ref 0.0–0.1)
Immature Granulocytes: 0 %
Lymphocytes Absolute: 2.2 10*3/uL (ref 0.7–3.1)
Lymphs: 26 %
MCH: 29 pg (ref 26.6–33.0)
MCHC: 34 g/dL (ref 31.5–35.7)
MCV: 85 fL (ref 79–97)
Monocytes Absolute: 0.7 10*3/uL (ref 0.1–0.9)
Monocytes: 8 %
Neutrophils Absolute: 5.2 10*3/uL (ref 1.4–7.0)
Neutrophils: 63 %
Platelets: 291 10*3/uL (ref 150–450)
RBC: 4.21 x10E6/uL (ref 3.77–5.28)
RDW: 12 % (ref 11.7–15.4)
WBC: 8.3 10*3/uL (ref 3.4–10.8)

## 2021-07-24 LAB — BASIC METABOLIC PANEL
BUN/Creatinine Ratio: 22 (ref 12–28)
BUN: 36 mg/dL (ref 10–36)
CO2: 21 mmol/L (ref 20–29)
Calcium: 9.6 mg/dL (ref 8.7–10.3)
Chloride: 104 mmol/L (ref 96–106)
Creatinine, Ser: 1.63 mg/dL — ABNORMAL HIGH (ref 0.57–1.00)
Glucose: 100 mg/dL — ABNORMAL HIGH (ref 70–99)
Potassium: 4.9 mmol/L (ref 3.5–5.2)
Sodium: 147 mmol/L — ABNORMAL HIGH (ref 134–144)
eGFR: 29 mL/min/{1.73_m2} — ABNORMAL LOW (ref >59)

## 2021-07-24 LAB — TSH: TSH: 1.2 u[IU]/mL (ref 0.450–4.500)

## 2021-08-07 ENCOUNTER — Encounter (HOSPITAL_COMMUNITY): Payer: Self-pay | Admitting: *Deleted

## 2021-08-07 ENCOUNTER — Emergency Department (HOSPITAL_COMMUNITY): Payer: Medicare Other

## 2021-08-07 ENCOUNTER — Other Ambulatory Visit: Payer: Self-pay

## 2021-08-07 ENCOUNTER — Inpatient Hospital Stay (HOSPITAL_COMMUNITY)
Admission: EM | Admit: 2021-08-07 | Discharge: 2021-08-10 | DRG: 394 | Disposition: A | Discharge status: Home / Self Care | Payer: Medicare Other | Attending: Family Medicine | Admitting: Family Medicine

## 2021-08-07 DIAGNOSIS — K56609 Unspecified intestinal obstruction, unspecified as to partial versus complete obstruction: Secondary | ICD-10-CM | POA: Diagnosis not present

## 2021-08-07 DIAGNOSIS — G4700 Insomnia, unspecified: Secondary | ICD-10-CM | POA: Diagnosis present

## 2021-08-07 DIAGNOSIS — Z7982 Long term (current) use of aspirin: Secondary | ICD-10-CM | POA: Diagnosis not present

## 2021-08-07 DIAGNOSIS — Z881 Allergy status to other antibiotic agents status: Secondary | ICD-10-CM

## 2021-08-07 DIAGNOSIS — Z83438 Family history of other disorder of lipoprotein metabolism and other lipidemia: Secondary | ICD-10-CM

## 2021-08-07 DIAGNOSIS — F419 Anxiety disorder, unspecified: Secondary | ICD-10-CM | POA: Diagnosis present

## 2021-08-07 DIAGNOSIS — K449 Diaphragmatic hernia without obstruction or gangrene: Secondary | ICD-10-CM | POA: Diagnosis not present

## 2021-08-07 DIAGNOSIS — Z79890 Hormone replacement therapy (postmenopausal): Secondary | ICD-10-CM | POA: Diagnosis not present

## 2021-08-07 DIAGNOSIS — K439 Ventral hernia without obstruction or gangrene: Secondary | ICD-10-CM | POA: Diagnosis not present

## 2021-08-07 DIAGNOSIS — I129 Hypertensive chronic kidney disease with stage 1 through stage 4 chronic kidney disease, or unspecified chronic kidney disease: Secondary | ICD-10-CM | POA: Diagnosis present

## 2021-08-07 DIAGNOSIS — N261 Atrophy of kidney (terminal): Secondary | ICD-10-CM | POA: Diagnosis not present

## 2021-08-07 DIAGNOSIS — N1832 Chronic kidney disease, stage 3b: Secondary | ICD-10-CM | POA: Diagnosis present

## 2021-08-07 DIAGNOSIS — Z888 Allergy status to other drugs, medicaments and biological substances status: Secondary | ICD-10-CM

## 2021-08-07 DIAGNOSIS — G8929 Other chronic pain: Secondary | ICD-10-CM | POA: Diagnosis not present

## 2021-08-07 DIAGNOSIS — M10379 Gout due to renal impairment, unspecified ankle and foot: Secondary | ICD-10-CM | POA: Diagnosis not present

## 2021-08-07 DIAGNOSIS — K436 Other and unspecified ventral hernia with obstruction, without gangrene: Secondary | ICD-10-CM | POA: Diagnosis not present

## 2021-08-07 DIAGNOSIS — Z880 Allergy status to penicillin: Secondary | ICD-10-CM

## 2021-08-07 DIAGNOSIS — I4892 Unspecified atrial flutter: Secondary | ICD-10-CM | POA: Diagnosis present

## 2021-08-07 DIAGNOSIS — I480 Paroxysmal atrial fibrillation: Secondary | ICD-10-CM | POA: Diagnosis present

## 2021-08-07 DIAGNOSIS — K219 Gastro-esophageal reflux disease without esophagitis: Secondary | ICD-10-CM | POA: Diagnosis present

## 2021-08-07 DIAGNOSIS — R0902 Hypoxemia: Secondary | ICD-10-CM | POA: Diagnosis not present

## 2021-08-07 DIAGNOSIS — K5730 Diverticulosis of large intestine without perforation or abscess without bleeding: Secondary | ICD-10-CM | POA: Diagnosis not present

## 2021-08-07 DIAGNOSIS — N179 Acute kidney failure, unspecified: Secondary | ICD-10-CM | POA: Diagnosis not present

## 2021-08-07 DIAGNOSIS — M10372 Gout due to renal impairment, left ankle and foot: Secondary | ICD-10-CM | POA: Diagnosis present

## 2021-08-07 DIAGNOSIS — Z833 Family history of diabetes mellitus: Secondary | ICD-10-CM | POA: Diagnosis not present

## 2021-08-07 DIAGNOSIS — Z882 Allergy status to sulfonamides status: Secondary | ICD-10-CM

## 2021-08-07 DIAGNOSIS — J9811 Atelectasis: Secondary | ICD-10-CM | POA: Diagnosis not present

## 2021-08-07 DIAGNOSIS — Z20822 Contact with and (suspected) exposure to covid-19: Secondary | ICD-10-CM | POA: Diagnosis not present

## 2021-08-07 DIAGNOSIS — E860 Dehydration: Secondary | ICD-10-CM | POA: Diagnosis not present

## 2021-08-07 DIAGNOSIS — Z853 Personal history of malignant neoplasm of breast: Secondary | ICD-10-CM | POA: Diagnosis not present

## 2021-08-07 DIAGNOSIS — I4891 Unspecified atrial fibrillation: Secondary | ICD-10-CM | POA: Diagnosis present

## 2021-08-07 DIAGNOSIS — E039 Hypothyroidism, unspecified: Secondary | ICD-10-CM | POA: Diagnosis not present

## 2021-08-07 DIAGNOSIS — Z79899 Other long term (current) drug therapy: Secondary | ICD-10-CM | POA: Diagnosis not present

## 2021-08-07 DIAGNOSIS — E038 Other specified hypothyroidism: Secondary | ICD-10-CM | POA: Diagnosis not present

## 2021-08-07 DIAGNOSIS — I4811 Longstanding persistent atrial fibrillation: Secondary | ICD-10-CM | POA: Diagnosis not present

## 2021-08-07 DIAGNOSIS — R109 Unspecified abdominal pain: Secondary | ICD-10-CM | POA: Diagnosis not present

## 2021-08-07 DIAGNOSIS — K6389 Other specified diseases of intestine: Secondary | ICD-10-CM | POA: Diagnosis not present

## 2021-08-07 LAB — CBC WITH DIFFERENTIAL/PLATELET
Abs Immature Granulocytes: 0.06 10*3/uL (ref 0.00–0.07)
Basophils Absolute: 0 10*3/uL (ref 0.0–0.1)
Basophils Relative: 0 %
Eosinophils Absolute: 0 10*3/uL (ref 0.0–0.5)
Eosinophils Relative: 0 %
HCT: 41.5 % (ref 36.0–46.0)
Hemoglobin: 13.4 g/dL (ref 12.0–15.0)
Immature Granulocytes: 0 %
Lymphocytes Relative: 12 %
Lymphs Abs: 1.7 10*3/uL (ref 0.7–4.0)
MCH: 29.1 pg (ref 26.0–34.0)
MCHC: 32.3 g/dL (ref 30.0–36.0)
MCV: 90.2 fL (ref 80.0–100.0)
Monocytes Absolute: 0.6 10*3/uL (ref 0.1–1.0)
Monocytes Relative: 4 %
Neutro Abs: 11.4 10*3/uL — ABNORMAL HIGH (ref 1.7–7.7)
Neutrophils Relative %: 84 %
Platelets: 318 10*3/uL (ref 150–400)
RBC: 4.6 MIL/uL (ref 3.87–5.11)
RDW: 12.2 % (ref 11.5–15.5)
WBC: 13.8 10*3/uL — ABNORMAL HIGH (ref 4.0–10.5)
nRBC: 0 % (ref 0.0–0.2)

## 2021-08-07 LAB — COMPREHENSIVE METABOLIC PANEL
ALT: 15 U/L (ref 0–44)
AST: 23 U/L (ref 15–41)
Albumin: 4.7 g/dL (ref 3.5–5.0)
Alkaline Phosphatase: 75 U/L (ref 38–126)
Anion gap: 14 (ref 5–15)
BUN: 43 mg/dL — ABNORMAL HIGH (ref 8–23)
CO2: 26 mmol/L (ref 22–32)
Calcium: 10.2 mg/dL (ref 8.9–10.3)
Chloride: 100 mmol/L (ref 98–111)
Creatinine, Ser: 1.9 mg/dL — ABNORMAL HIGH (ref 0.44–1.00)
GFR, Estimated: 24 mL/min — ABNORMAL LOW (ref >60)
Glucose, Bld: 162 mg/dL — ABNORMAL HIGH (ref 70–99)
Potassium: 4.6 mmol/L (ref 3.5–5.1)
Sodium: 140 mmol/L (ref 135–145)
Total Bilirubin: 0.7 mg/dL (ref 0.3–1.2)
Total Protein: 8 g/dL (ref 6.5–8.1)

## 2021-08-07 LAB — RESP PANEL BY RT-PCR (FLU A&B, COVID) ARPGX2
Influenza A by PCR: NEGATIVE
Influenza B by PCR: NEGATIVE
SARS Coronavirus 2 by RT PCR: NEGATIVE

## 2021-08-07 LAB — LIPASE, BLOOD: Lipase: 33 U/L (ref 11–51)

## 2021-08-07 MED ORDER — HEPARIN SODIUM (PORCINE) 5000 UNIT/ML IJ SOLN
5000.0000 [IU] | Freq: Three times a day (TID) | INTRAMUSCULAR | Status: DC
  Administered 2021-08-07 – 2021-08-10 (×8): 5000 [IU] via SUBCUTANEOUS
  Filled 2021-08-07 (×9): qty 1

## 2021-08-07 MED ORDER — ACETAMINOPHEN 650 MG RE SUPP: 650.0000 mg | Freq: Four times a day (QID) | RECTAL | Status: DC | PRN

## 2021-08-07 MED ORDER — ALPRAZOLAM 0.25 MG PO TABS
0.2500 mg | ORAL_TABLET | Freq: Two times a day (BID) | ORAL | Status: DC | PRN
  Administered 2021-08-07 – 2021-08-09 (×3): 0.25 mg via ORAL
  Filled 2021-08-07 (×3): qty 1

## 2021-08-07 MED ORDER — DEXTROSE-NACL 5-0.45 % IV SOLN
INTRAVENOUS | Status: DC
Start: 2021-08-07 — End: 2021-08-10

## 2021-08-07 MED ORDER — HYDROMORPHONE HCL 1 MG/ML IJ SOLN
0.5000 mg | Freq: Once | INTRAMUSCULAR | Status: AC
  Administered 2021-08-07: 0.5 mg via INTRAVENOUS
  Filled 2021-08-07: qty 1

## 2021-08-07 MED ORDER — SODIUM CHLORIDE 0.9 % IV BOLUS
500.0000 mL | Freq: Once | INTRAVENOUS | Status: AC
  Administered 2021-08-07: 500 mL via INTRAVENOUS

## 2021-08-07 MED ORDER — ALPRAZOLAM 0.5 MG PO TABS
0.5000 mg | ORAL_TABLET | Freq: Two times a day (BID) | ORAL | Status: DC | PRN
Start: 2021-08-07 — End: 2021-08-07

## 2021-08-07 MED ORDER — SODIUM CHLORIDE 0.9% FLUSH
3.0000 mL | Freq: Two times a day (BID) | INTRAVENOUS | Status: DC
  Administered 2021-08-08 – 2021-08-10 (×3): 3 mL via INTRAVENOUS

## 2021-08-07 MED ORDER — ONDANSETRON HCL 4 MG/2ML IJ SOLN: 4.0000 mg | Freq: Four times a day (QID) | INTRAMUSCULAR | Status: DC | PRN

## 2021-08-07 MED ORDER — BISACODYL 10 MG RE SUPP: 10.0000 mg | Freq: Every day | RECTAL | Status: DC | PRN

## 2021-08-07 MED ORDER — ONDANSETRON HCL 4 MG PO TABS
4.0000 mg | ORAL_TABLET | Freq: Four times a day (QID) | ORAL | Status: DC | PRN
Start: 2021-08-07 — End: 2021-08-10

## 2021-08-07 MED ORDER — SODIUM CHLORIDE 0.9 % IV SOLN: 250.0000 mL | INTRAVENOUS | Status: DC | PRN

## 2021-08-07 MED ORDER — SODIUM CHLORIDE 0.9% FLUSH: 3.0000 mL | INTRAVENOUS | Status: DC | PRN

## 2021-08-07 MED ORDER — METOPROLOL TARTRATE 5 MG/5ML IV SOLN
5.0000 mg | Freq: Three times a day (TID) | INTRAVENOUS | Status: DC
  Administered 2021-08-07 – 2021-08-08 (×3): 5 mg via INTRAVENOUS
  Filled 2021-08-07 (×4): qty 5

## 2021-08-07 MED ORDER — ONDANSETRON HCL 4 MG/2ML IJ SOLN
4.0000 mg | Freq: Once | INTRAMUSCULAR | Status: AC
  Administered 2021-08-07: 4 mg via INTRAVENOUS
  Filled 2021-08-07: qty 2

## 2021-08-07 MED ORDER — ACETAMINOPHEN 325 MG PO TABS
650.0000 mg | ORAL_TABLET | Freq: Four times a day (QID) | ORAL | Status: DC | PRN
  Filled 2021-08-07: qty 2

## 2021-08-07 MED ORDER — SODIUM CHLORIDE 0.9% FLUSH
3.0000 mL | Freq: Two times a day (BID) | INTRAVENOUS | Status: DC
  Administered 2021-08-07 – 2021-08-10 (×5): 3 mL via INTRAVENOUS

## 2021-08-07 MED ORDER — TRAZODONE HCL 50 MG PO TABS
50.0000 mg | ORAL_TABLET | Freq: Every evening | ORAL | Status: DC | PRN
  Administered 2021-08-10: 50 mg via ORAL
  Filled 2021-08-07: qty 1

## 2021-08-07 MED ORDER — POLYETHYLENE GLYCOL 3350 17 G PO PACK: 17.0000 g | PACK | Freq: Every day | ORAL | Status: DC | PRN

## 2021-08-07 MED ORDER — FENTANYL CITRATE PF 50 MCG/ML IJ SOSY
25.0000 ug | PREFILLED_SYRINGE | INTRAMUSCULAR | Status: DC | PRN
  Administered 2021-08-08 – 2021-08-09 (×3): 25 ug via INTRAVENOUS
  Filled 2021-08-07 (×3): qty 1

## 2021-08-07 MED ORDER — LABETALOL HCL 5 MG/ML IV SOLN: 10.0000 mg | INTRAVENOUS | Status: DC | PRN

## 2021-08-07 NOTE — ED Notes (Signed)
Pt to CT

## 2021-08-07 NOTE — ED Notes (Signed)
Pt does not want NG tube- no vomiting or pain at this time- Dr Joesph Fillers made aware.  ?

## 2021-08-07 NOTE — ED Notes (Signed)
Patient 84% on RA. Patient placed on 2L/[place].  ?

## 2021-08-07 NOTE — ED Provider Notes (Signed)
?[place] ?Provider Note ? ? ?CSN: [id number] ?Arrival date & time: 08/07/21  1540 ? ?  ? ?History ? ?Chief Complaint  ?Patient presents with  ? Abdominal Pain  ? ? ?[name] is a 88 y.o. female. ? ?Patient complains of vomiting since last night.  She has a history of small bowel obstruction before from abdominal hernia.  Patient has a history of thyroid disease and hypertension ? ?The history is provided by the patient and medical records. No language interpreter was used.  ?Abdominal Pain ?Pain location:  Epigastric ?Pain quality: aching   ?Pain radiates to:  Does not radiate ?Pain severity:  Moderate ?Onset quality:  Sudden ?Timing:  Constant ?Progression:  Worsening ?Chronicity:  Recurrent ?Context: not alcohol use   ?Associated symptoms: no chest pain, no cough, no diarrhea, no fatigue and no hematuria   ? ?  ? ?Home Medications ?Prior to Admission medications   ?Medication Sig Start Date End Date Taking? Authorizing Provider  ?aspirin EC 81 MG tablet Take 81 mg by mouth daily. Swallow whole.    [provider]  ?carbamide peroxide (DEBROX) 6.5 % OTIC solution Place 5 drops into both ears 2 (two) times daily as needed (as needed for ear wax build up). 08/31/20   Scot Jun, FNP  ?ciprofloxacin (CIPRO) 250 MG tablet One bid for 5 days 07/22/21   [name], MD  ?diltiazem (CARDIZEM CD) 120 MG 24 hr capsule TAKE (1) CAPSULE BY MOUTH ONCE DAILY. ?Patient taking differently: Take 120 mg by mouth daily. 07/13/21   [name], MD  ?HYDROcodone-acetaminophen (NORCO/VICODIN) 5-325 MG tablet TAKE 1 TABLET BY MOUTH EVERY 6 HOURS AS NEEDED. 07/09/21   [name], MD  ?levothyroxine (SYNTHROID) 75 MCG tablet TAKE ONE TABLET BY MOUTH ONCE DAILY. 07/13/21   [name], MD  ?lisinopril (ZESTRIL) 2.5 MG tablet Take 1 tablet (2.5 mg total) by mouth daily. 04/09/21   [name], MD  ?metoprolol succinate (TOPROL-XL) 50 MG 24 hr tablet TAKE 1 TABLET BY MOUTH DAILY.  TAKE WITH OR IMMEDIATELY FOLLOWING A MEAL. 07/13/21   [name], MD  ?Multiple Vitamin (MULTIVITAMIN) tablet Take 1 tablet by mouth daily.    [provider]  ?ondansetron (ZOFRAN ODT) 8 MG disintegrating tablet Take 1 tablet (8 mg total) by mouth every 8 (eight) hours as needed for nausea or vomiting. ?Patient not taking: Reported on 07/22/2021 12/02/20   [name], MD  ?pantoprazole (PROTONIX) 40 MG tablet TAKE 1 TABLET BY MOUTH ONCE DAILY. 07/13/21   [name], MD  ?polyethylene glycol powder (GLYCOLAX/MIRALAX) 17 GM/SCOOP powder Take 17 g by mouth daily as needed.    [provider]  ?potassium chloride (KLOR-CON) 10 MEQ tablet TAKE ONE TABLET BY MOUTH ONCE DAILY. 06/13/21   [name], MD  ?QC ACETAMINOPHEN 8 HOURS 650 MG CR tablet Take 1 tablet by mouth in the morning and at bedtime. 10/23/19   [provider]  ?sertraline (ZOLOFT) 50 MG tablet TAKE 1 TABLET BY MOUTH ONCE A DAY. 06/13/21   [name], MD  ?torsemide (DEMADEX) 20 MG tablet TAKE ONE TABLET BY MOUTH ONCE EVERY MORNING. 07/13/21   [name], MD  ?trolamine salicylate (ASPERCREME) 10 % cream Apply 1 application topically as needed (feet and shoulders).    [provider]  ?vitamin B-12 1000 MCG tablet Take 1 tablet (1,000 mcg total) by mouth daily. 07/21/17   Mary Sella, NP  ?   ? ?  Allergies    ?Amiodarone, Cephalosporins, Meloxicam, Robaxin [methocarbamol], Amoxicillin, Lovenox [enoxaparin sodium], Morphine, Morphine and related, Penicillins, Sulfa antibiotics, Lipitor [atorvastatin], Lorazepam, and Macrobid [nitrofurantoin]   ? ?Review of Systems   ?Review of Systems  ?Constitutional:  Negative for appetite change and fatigue.  ?HENT:  Negative for congestion, ear discharge and sinus pressure.   ?Eyes:  Negative for discharge.  ?Respiratory:  Negative for cough.   ?Cardiovascular:  Negative for chest pain.  ?Gastrointestinal:  Positive for abdominal pain. Negative for diarrhea.   ?Genitourinary:  Negative for frequency and hematuria.  ?Musculoskeletal:  Negative for back pain.  ?Skin:  Negative for rash.  ?Neurological:  Negative for seizures and headaches.  ?Psychiatric/Behavioral:  Negative for hallucinations.   ? ?Physical Exam ?Updated Vital Signs ?BP (!) 160/72   Pulse 81   Temp 98.3 ?F (36.8 ?C) (Oral)   Resp 20   Ht '5\' 3"'$  (1.6 m)   Wt 64.4 kg   SpO2 98%   BMI 25.15 kg/m?  ?Physical Exam ?Vitals and nursing note reviewed.  ?Constitutional:   ?   Appearance: She is well-developed.  ?HENT:  ?   Head: Normocephalic.  ?Eyes:  ?   General: No scleral icterus. ?   Conjunctiva/sclera: Conjunctivae normal.  ?Neck:  ?   Thyroid: No thyromegaly.  ?Cardiovascular:  ?   Rate and Rhythm: Normal rate and regular rhythm.  ?   Heart sounds: No murmur heard. ?  No friction rub. No gallop.  ?Pulmonary:  ?   Breath sounds: No stridor. No wheezing or rales.  ?Chest:  ?   Chest wall: No tenderness.  ?Abdominal:  ?   General: There is no distension.  ?   Tenderness: There is abdominal tenderness. There is no rebound.  ?Musculoskeletal:     ?   General: Normal range of motion.  ?   Cervical back: Neck supple.  ?Lymphadenopathy:  ?   Cervical: No cervical adenopathy.  ?Skin: ?   Findings: No erythema or rash.  ?Neurological:  ?   Mental Status: She is alert and oriented to person, place, and time.  ?   Motor: No abnormal muscle tone.  ?   Coordination: Coordination normal.  ?Psychiatric:     ?   Behavior: Behavior normal.  ? ? ?ED Results / Procedures / Treatments   ?Labs ?(all labs ordered are listed, but only abnormal results are displayed) ?Labs Reviewed  ?CBC WITH DIFFERENTIAL/PLATELET - Abnormal; Notable for the following components:  ?    Result Value  ? WBC 13.8 (*)   ? Neutro Abs 11.4 (*)   ? All other components within normal limits  ?COMPREHENSIVE METABOLIC PANEL - Abnormal; Notable for the following components:  ? Glucose, Bld 162 (*)   ? BUN 43 (*)   ? Creatinine, Ser 1.90 (*)   ? GFR,  Estimated 24 (*)   ? All other components within normal limits  ?LIPASE, BLOOD  ? ? ?EKG ?None ? ?Radiology ?CT ABDOMEN PELVIS WO CONTRAST ? ?Result Date: 08/07/2021 ?CLINICAL DATA:  Abdominal pain and vomiting. EXAM: CT ABDOMEN AND PELVIS WITHOUT CONTRAST TECHNIQUE: Multidetector CT imaging of the abdomen and pelvis was performed following the standard protocol without IV contrast. RADIATION DOSE REDUCTION: This exam was performed according to the departmental dose-optimization program which includes automated exposure control, adjustment of the mA and/or kV according to patient size and/or use of iterative reconstruction technique. COMPARISON:  CT abdomen pelvis dated February 09, 2021. FINDINGS: Lower chest: No acute  abnormality. Chronic bronchiectasis and scarring at both lung bases, similar to prior study. Hepatobiliary: No focal liver abnormality is seen. No gallstones, gallbladder wall thickening, or biliary dilatation. Pancreas: Unremarkable. No pancreatic ductal dilatation or surrounding inflammatory changes. Spleen: Normal in size without focal abnormality. Adrenals/Urinary Tract: Adrenal glands are unremarkable. Similar asymmetric right renal atrophy. No renal calculi or hydronephrosis. The bladder is unremarkable. Stomach/Bowel: Unchanged small hiatal hernia. Stomach is otherwise within normal limits. Unchanged large duodenal diverticulum. There are multiple dilated loops of small bowel with transition point in the previously seen small inferior abdominal wall ventral hernia a few centimeters above the pubic symphysis. (Series 2, image 66; series 6, image 65; series 5, image 25). No bowel wall thickening or pneumatosis. Colonic diverticulosis. History of prior appendectomy. Vascular/Lymphatic: Aortic atherosclerosis. No enlarged abdominal or pelvic lymph nodes. Reproductive: Uterus and bilateral adnexa are unremarkable. Other: No free fluid or pneumoperitoneum. Musculoskeletal: No acute or significant  osseous findings. IMPRESSION: 1. Small bowel obstruction with transition point in the previously seen small inferior abdominal wall ventral hernia just superior to the pubic symphysis. 2. Aortic Atherosclerosis (ICD10

## 2021-08-07 NOTE — H&P (Addendum)
?                                                                                           ? ? ? ? Patient Demographics:  ? ? ?[name], is a 88 y.o. female  MRN: [id number]   DOB - 1924-03-04 ? ?Admit Date - 08/07/2021 ? ?Outpatient Primary MD for the patient is [name], MD ? ? Assessment & Plan:  ? ?Assessment and Plan: ? ?1)SBO-- -CTAP consistent with Small bowel obstruction with transition point in the previously seen small inferior abdominal wall ventral hernia just superior to the pubic symphysis ?-WBC 13.8, -LFTs are not elevated, -Lipase is 33 ?-Official surgical consult pending ?-Patient declines NG tube-- please see HPI ?-N.p.o. status, ?- IV fluids ?-Antiemetics ? ?2)AKI----acute kidney injury on CKD stage -3B ?-Creatinine up to 1.9 baseline 1.6  ?-Suspect this is due to dehydration in the setting of SBO poor oral intake and recurrent emesis ?Renally adjust medications, avoid nephrotoxic agents / dehydration  / hypotension ?-Hold lisinopril, hold torsemide ?-Gentle IV fluids pending return of bowel function and tolerance of oral intake ? ?3)Paroxysmal A-fib/atrial flutter--previously on Xarelto which was discontinued due to GI bleed, patient also has history of ICH/SAH  back in February 2019 ?-Hold PTA Toprol-XL ?-Hold PTA Cardizem CD ?-Hold PTA aspirin ?-May use IV Cardizem for rate control at this time ?-Last echo from February 2019 with EF of 55 to 60%, no AS, no MS, there was mild left atrial enlargement ? ?3)Anxiety disorder and insomnia--- May use Xanax as needed ? ?4)HTN--hold lisinopril and torsemide due to AKI on CKD, ?-Hold Cardizem CD and Toprol-XL until able to tolerate oral intake ?- may use IV labetalol when necessary  Every 4 hours for systolic blood pressure over 160 mmhg ? ?5)Hypothyroidism--hold levothyroxine, if patient is still n.p.o. in about 48 hours consider IV levothyroxine at the time ?-TSH was  1.2 on 07/22/2021 ? ?6) transient hypoxia---??  Poor equipment ?-Continue to monitor wound give oxygen as needed to keep O2 sats above 92% ?- Chest x-rays in a.m. to rule out aspiration ? ?Disposition/Need for in-Hospital Stay- patient unable to be discharged at this time due to SBO requiring IV fluids and IV antiemetics pending return of bowel function and tolerance of oral intake ? ?Status is: Inpatient ? ?Dispo: The patient is from: Home ?             Anticipated d/c is to: Home ?             Anticipated d/c date is: 2 days ?             Patient currently is not medically stable to d/c. ?Barriers: Not Clinically Stable-  ?With History of - ?Reviewed by me ? ?Past Medical History:  ?Diagnosis Date  ? Abdominal pain, chronic, right lower quadrant   ? "ever since I had my appendix removed"  ? Atrial fibrillation ([place])   ? Breast cancer ([place])   ? Cancer [facility])   ? left side  ? Chronic back pain   ? Edema   ? GERD (gastroesophageal reflux disease)   ?  Hyperlipidemia   ? Hypertension   ? Osteopenia   ? Osteoporosis   ? Ovarian cyst 02/2015  ? left  ? Recurrent abdominal pain   ? LLQ  ?   ? ?Past Surgical History:  ?Procedure Laterality Date  ? APPENDECTOMY    ? APPENDECTOMY    ? BREAST SURGERY Left   ? lumpectomy  ? CARDIOVERSION N/A 03/31/2016  ? Procedure: CARDIOVERSION;  Surgeon: Josue Hector, MD;  Location: [facility];  Service: Cardiovascular;  Laterality: N/A;  ? COLON RESECTION    ? COLON SURGERY    ? HERNIA REPAIR    ? HERNIA REPAIR    ? ? ?Chief Complaint  ?Patient presents with  ? Abdominal Pain  ?  ? ? HPI:  ? ? [name]  is a 88 y.o. female with past medical history relevant for A-fib a flutter, history of GI bleed and prior History of ICH/SAH 06/2017, hypothyroidism, HTN, anxiety who presents with abdominal pain of 24 hours duration associated with emesis emesis is without bile or blood ?-Additional history obtained from patient's son Annie Main at bedside ?-No diarrhea, no BM in the last 24 hours ?-In ED  patient had transient hypoxia and was placed on 2 L of oxygen via nasal cannula ?No fever  Or chills  ?- ?Patient had emesis in the ED, patient's son Annie Main at bedside--- RN attempted to place NG tube--- patient and son requested no further attempts the patient does not want NG tube at this time ?- ?-CTAP consistent with Small bowel obstruction with transition point in the previously seen small inferior abdominal wall ventral hernia just superior to the pubic symphysis ?-WBC 13.8 ?-Creatinine 1.90, BUN 43 sodium 140 potassium 4.6 ?-LFTs are not elevated ?-Lipase is 33 ?-EDP discussed case with on-call general surgeon Dr. Constance Haw official consult pending ? Review of systems:  ?  ?In addition to the HPI above,  ? ?A full Review of  Systems was done, all other systems reviewed are negative except as noted above in HPI , . ? ? ? Social History:  ?Reviewed by me ? ?  ?Social History  ? ?Tobacco Use  ? Smoking status: Never  ? Smokeless tobacco: Never  ?Substance Use Topics  ? Alcohol use: No  ?  Alcohol/week: 0.0 standard drinks  ? ? ? Family History :  ?Reviewed by me ? ?  ?Family History  ?Problem Relation Age of Onset  ? Diabetes Mother   ? Heart disease Other   ? Arthritis Other   ? Cancer Other   ? Hyperlipidemia Brother   ? ? ? Home Medications:  ? ?Prior to Admission medications   ?Medication Sig Start Date End Date Taking? Authorizing Provider  ?aspirin EC 81 MG tablet Take 81 mg by mouth daily. Swallow whole.   Yes [provider]  ?carbamide peroxide (DEBROX) 6.5 % OTIC solution Place 5 drops into both ears 2 (two) times daily as needed (as needed for ear wax build up). 08/31/20  Yes Scot Jun, FNP  ?diltiazem (CARDIZEM CD) 120 MG 24 hr capsule TAKE (1) CAPSULE BY MOUTH ONCE DAILY. ?Patient taking differently: Take 120 mg by mouth daily. 07/13/21  Yes [name], MD  ?HYDROcodone-acetaminophen (NORCO/VICODIN) 5-325 MG tablet TAKE 1 TABLET BY MOUTH EVERY 6 HOURS AS NEEDED. 07/09/21  Yes  Luking, Elayne Snare, MD  ?levothyroxine (SYNTHROID) 75 MCG tablet TAKE ONE TABLET BY MOUTH ONCE DAILY. 07/13/21  Yes [name], MD  ?lisinopril (ZESTRIL) 2.5 MG  tablet Take 1 tablet (2.5 mg total) by mouth daily. 04/09/21  Yes [name], MD  ?metoprolol succinate (TOPROL-XL) 50 MG 24 hr tablet TAKE 1 TABLET BY MOUTH DAILY. TAKE WITH OR IMMEDIATELY FOLLOWING A MEAL. 07/13/21  Yes Luking, Scott A, MD  ?pantoprazole (PROTONIX) 40 MG tablet TAKE 1 TABLET BY MOUTH ONCE DAILY. 07/13/21  Yes [name], MD  ?polyethylene glycol powder (GLYCOLAX/MIRALAX) 17 GM/SCOOP powder Take 17 g by mouth daily as needed.   Yes [provider]  ?potassium chloride (KLOR-CON) 10 MEQ tablet TAKE ONE TABLET BY MOUTH ONCE DAILY. 06/13/21  Yes Luking, Elayne Snare, MD  ?QC ACETAMINOPHEN 8 HOURS 650 MG CR tablet Take 1 tablet by mouth in the morning and at bedtime. 10/23/19  Yes [provider]  ?sertraline (ZOLOFT) 50 MG tablet TAKE 1 TABLET BY MOUTH ONCE A DAY. 06/13/21  Yes Luking, Elayne Snare, MD  ?torsemide (DEMADEX) 20 MG tablet TAKE ONE TABLET BY MOUTH ONCE EVERY MORNING. ?Patient taking differently: Take 20 mg by mouth daily. 07/13/21  Yes [name], MD  ?trolamine salicylate (ASPERCREME) 10 % cream Apply 1 application topically as needed (feet and shoulders).   Yes [provider]  ?vitamin B-12 1000 MCG tablet Take 1 tablet (1,000 mcg total) by mouth daily. 07/21/17  Yes Costello, Kayren Eaves, NP  ?ciprofloxacin (CIPRO) 250 MG tablet One bid for 5 days ?Patient not taking: Reported on 08/07/2021 07/22/21   [name], MD  ?ondansetron (ZOFRAN ODT) 8 MG disintegrating tablet Take 1 tablet (8 mg total) by mouth every 8 (eight) hours as needed for nausea or vomiting. ?Patient not taking: Reported on 07/22/2021 12/02/20   [name], MD  ? ? ? Allergies:  ? ?  ?Allergies  ?Allergen Reactions  ? Amiodarone Itching  ? Cephalosporins Itching  ? Meloxicam Itching  ? Robaxin [Methocarbamol] Itching  ? Amoxicillin  Hives  ? Lovenox [Enoxaparin Sodium] Nausea And Vomiting  ? Morphine   ? Morphine And Related Itching  ? Penicillins Swelling  ?   ?  ? Sulfa Antibiotics Swelling  ? Lipitor [Atorvastatin] Other (See Comments)

## 2021-08-07 NOTE — ED Triage Notes (Signed)
Pt with abd pain that started last night after eating a plum.  Pt began to have emesis this morning. Denies any diarrhea.  ?

## 2021-08-07 NOTE — ED Notes (Signed)
Dr Joesph Fillers at bedside- pt allowed attempt for NG tube- unsuccessful- pt sneezing and pulling back, pt refuses another attempt-Dr courage aware. Informed 3rd floor what the hold up was getting pt transported upstairs- ready to transport now.  ?

## 2021-08-08 ENCOUNTER — Inpatient Hospital Stay (HOSPITAL_COMMUNITY): Payer: Medicare Other

## 2021-08-08 DIAGNOSIS — K436 Other and unspecified ventral hernia with obstruction, without gangrene: Principal | ICD-10-CM

## 2021-08-08 DIAGNOSIS — K56609 Unspecified intestinal obstruction, unspecified as to partial versus complete obstruction: Secondary | ICD-10-CM | POA: Diagnosis not present

## 2021-08-08 DIAGNOSIS — F419 Anxiety disorder, unspecified: Secondary | ICD-10-CM | POA: Diagnosis not present

## 2021-08-08 DIAGNOSIS — I4811 Longstanding persistent atrial fibrillation: Secondary | ICD-10-CM | POA: Diagnosis not present

## 2021-08-08 DIAGNOSIS — N179 Acute kidney failure, unspecified: Secondary | ICD-10-CM | POA: Diagnosis present

## 2021-08-08 DIAGNOSIS — E038 Other specified hypothyroidism: Secondary | ICD-10-CM

## 2021-08-08 LAB — CBC
HCT: 34.6 % — ABNORMAL LOW (ref 36.0–46.0)
Hemoglobin: 10.6 g/dL — ABNORMAL LOW (ref 12.0–15.0)
MCH: 28 pg (ref 26.0–34.0)
MCHC: 30.6 g/dL (ref 30.0–36.0)
MCV: 91.3 fL (ref 80.0–100.0)
Platelets: 242 10*3/uL (ref 150–400)
RBC: 3.79 MIL/uL — ABNORMAL LOW (ref 3.87–5.11)
RDW: 12.2 % (ref 11.5–15.5)
WBC: 11.4 10*3/uL — ABNORMAL HIGH (ref 4.0–10.5)
nRBC: 0 % (ref 0.0–0.2)

## 2021-08-08 LAB — BASIC METABOLIC PANEL
Anion gap: 10 (ref 5–15)
BUN: 36 mg/dL — ABNORMAL HIGH (ref 8–23)
CO2: 25 mmol/L (ref 22–32)
Calcium: 8.8 mg/dL — ABNORMAL LOW (ref 8.9–10.3)
Chloride: 107 mmol/L (ref 98–111)
Creatinine, Ser: 1.61 mg/dL — ABNORMAL HIGH (ref 0.44–1.00)
GFR, Estimated: 29 mL/min — ABNORMAL LOW (ref >60)
Glucose, Bld: 111 mg/dL — ABNORMAL HIGH (ref 70–99)
Potassium: 3.9 mmol/L (ref 3.5–5.1)
Sodium: 142 mmol/L (ref 135–145)

## 2021-08-08 LAB — GLUCOSE, CAPILLARY
Glucose-Capillary: 105 mg/dL — ABNORMAL HIGH (ref 70–99)
Glucose-Capillary: 108 mg/dL — ABNORMAL HIGH (ref 70–99)
Glucose-Capillary: 108 mg/dL — ABNORMAL HIGH (ref 70–99)
Glucose-Capillary: 134 mg/dL — ABNORMAL HIGH (ref 70–99)
Glucose-Capillary: 176 mg/dL — ABNORMAL HIGH (ref 70–99)

## 2021-08-08 MED ORDER — ASPIRIN EC 81 MG PO TBEC
81.0000 mg | DELAYED_RELEASE_TABLET | Freq: Every day | ORAL | Status: DC
  Administered 2021-08-09 – 2021-08-10 (×2): 81 mg via ORAL
  Filled 2021-08-08 (×2): qty 1

## 2021-08-08 MED ORDER — METOPROLOL SUCCINATE ER 25 MG PO TB24
25.0000 mg | ORAL_TABLET | Freq: Every day | ORAL | Status: DC
  Filled 2021-08-08 (×2): qty 1

## 2021-08-08 MED ORDER — SERTRALINE HCL 50 MG PO TABS
50.0000 mg | ORAL_TABLET | Freq: Every day | ORAL | Status: DC
  Administered 2021-08-09 – 2021-08-10 (×2): 50 mg via ORAL
  Filled 2021-08-08 (×2): qty 1

## 2021-08-08 MED ORDER — BISACODYL 10 MG RE SUPP
10.0000 mg | Freq: Every day | RECTAL | Status: DC
  Administered 2021-08-08 – 2021-08-09 (×2): 10 mg via RECTAL
  Filled 2021-08-08 (×3): qty 1

## 2021-08-08 MED ORDER — LEVOTHYROXINE SODIUM 75 MCG PO TABS
75.0000 ug | ORAL_TABLET | Freq: Every day | ORAL | Status: DC
  Administered 2021-08-09 – 2021-08-10 (×2): 75 ug via ORAL
  Filled 2021-08-08 (×2): qty 1

## 2021-08-08 MED ORDER — DILTIAZEM HCL ER COATED BEADS 120 MG PO CP24
120.0000 mg | ORAL_CAPSULE | Freq: Every day | ORAL | Status: DC
  Filled 2021-08-08 (×2): qty 1

## 2021-08-08 NOTE — Assessment & Plan Note (Addendum)
previously on Xarelto which was discontinued due to GI bleed, patient also has history of ICH/SAH  back in February 2019 ?-resume home Toprol-XL at lower dose 25 mg starting 3/20 ?-resume home Cardizem CD 120 mg ?-resume home aspirin ?-Last echo from February 2019 with EF of 55 to 60%, no AS, no MS, there was mild left atrial enlargement ?

## 2021-08-08 NOTE — Hospital Course (Addendum)
88 y.o. 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 ?Patient had emesis in the ED, patient's son Annie Main at bedside--- RN attempted to place NG tube--- patient and son requested no further attempts the patient does not want NG tube at this time ? ?-CTAP consistent with Small bowel obstruction with transition point in the previously seen small inferior abdominal wall ventral hernia just superior to the pubic symphysis ?-WBC 13.8 ?-Creatinine 1.90, BUN 43 sodium 140 potassium 4.6 ?-LFTs are not elevated ?-Lipase is 33 ?-EDP discussed case with on-call general surgeon Dr. Constance Haw who will consult.  Pt is NOT at all interested in having any surgery done given her advanced age and her heart condition.  She is very attuned to the situation and is  hoping that she will improve with conservative management.   ? ?08/08/2021: Pt having pain from her chronic pain issues but not having a lot of abdominal pain or discomfort in the abdomen.   ? ?08/09/2021:  Pt developed severe acute left foot pain with elevated uric acid levels thought secondary to acute gout attack.  Pt had multiple bowel movements in last 24 hours.  Abd xrays ordered by surgery.   CT scan shows resolution of SBO.  Diet started and advanced.  ? ?08/10/2021:  tolerating soft food diet, feeling much better, discussed with surgery ok to discharge home today.  Follow up with PCP.  ?

## 2021-08-08 NOTE — Assessment & Plan Note (Addendum)
Stable, controlled on home meds.  ?-resume home levothyroxine ?

## 2021-08-08 NOTE — Assessment & Plan Note (Addendum)
Alprazolam ordered as needed in hospital only.   ?

## 2021-08-08 NOTE — Assessment & Plan Note (Addendum)
Hernia reduced by Dr. Constance Haw 3/19.  ?Laxatives ordered (dulcolax suppository) daily.   ?

## 2021-08-08 NOTE — Progress Notes (Addendum)
?PROGRESS NOTE ? ? ?[name]  WCH:[id number] DOB: [birth year] DOA: 08/07/2021 ?PCP: [name], MD  ? ?Chief Complaint  ?Patient presents with  ? Abdominal Pain  ? ?Level of care: Med-Surg ? ?Brief Admission History:  ?88 y.o. 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 ?Patient had emesis in the ED, patient's son Annie Main at bedside--- RN attempted to place NG tube--- patient and son requested no further attempts the patient does not want NG tube at this time ? ?-CTAP consistent with Small bowel obstruction with transition point in the previously seen small inferior abdominal wall ventral hernia just superior to the pubic symphysis ?-WBC 13.8 ?-Creatinine 1.90, BUN 43 sodium 140 potassium 4.6 ?-LFTs are not elevated ?-Lipase is 33 ?-EDP discussed case with on-call general surgeon Dr. Constance Haw who will consult.  Pt is NOT at all interested in having any surgery done given her advanced age and her heart condition.  She is very attuned to the situation and is  hoping that she will improve with conservative management.   ? ?08/08/2021: Pt having pain from her chronic pain issues but not having a lot of abdominal pain or discomfort in the abdomen.   ?  ?Assessment and Plan: ?* SBO (small bowel obstruction) ([place]) ?CTAP consistent with Small bowel obstruction with transition point in the previously seen small inferior abdominal wall ventral hernia just superior to the pubic symphysis ?-WBC 13.8, -LFTs are not elevated, -Lipase is 33 ?-Official surgical consult pending ?-Patient declines NG  tube-- please see HPI ?-N.p.o. /sips with meds ?- IV fluids ?-Antiemetics ?-Patient declined further attempts to have NG tube placed. ? ?Ventral hernia ?Hernia reduced by Dr. Constance Haw.  ?Laxatives ordered (dulcolax suppository) daily.   ? ?AKI (acute kidney injury) on stage 3b CKD  ?Creatinine up to 1.9 baseline 1.6  ?-Suspect this is due to dehydration in the setting of SBO poor oral intake and recurrent emesis ?Renally adjust medications, avoid nephrotoxic agents / dehydration  / hypotension ?-Hold lisinopril, hold torsemide ?-Gentle IV fluids pending return of bowel function and tolerance of oral intake ?-recheck BMP in AM.  ? ?Atrial fibrillation ([place]) ?previously on Xarelto which was discontinued due to GI bleed, patient also has history of ICH/SAH  back in February 2019 ?-resume home Toprol-XL at lower dose 25 mg starting 3/20 ?-resume home Cardizem CD 120 mg ?-resume home aspirin ?-Last echo from February 2019 with EF of 55 to 60%, no AS, no MS, there was mild left atrial enlargement ? ?Anxiety ?Alprazolam ordered as needed.   ? ?Hypothyroid ?Stable, controlled on home meds.  ?-resume home levothyroxine 3/20 ? ?DVT prophylaxis: [place] heparin  ?Code Status: full  ?Family Communication:  ?Disposition: Status is: Inpatient ?Remains inpatient appropriate because: IV fluid required  ?  ?Consultants:  ?Surgery  ?Procedures:  ?N/a refused NG placement  ?Antimicrobials:  ?N/a   ?Subjective: ?Pt reports back pain but no abdominal pain.  ?Objective: ?Vitals:  ? 08/08/21 0217 08/08/21 0607 08/08/21 1341 08/08/21 1451  ?BP: 132/62 (!) 144/55 (!) 159/59 (!) 148/71  ?Pulse: 60  64 (!) 59 (!) 57  ?Resp: '18 18 16 20  '$ ?Temp: 98.6 ?F (37 ?C) 97.8 ?F (36.6 ?C)  98.6 ?F (37 ?C)  ?TempSrc: Oral Oral  Oral  ?SpO2: 96% 97% 99% 97%  ?Weight:      ?Height:      ? ? ?Intake/Output Summary (Last 24 hours) at 08/08/2021 1633 ?Last data filed at 08/07/2021 2100 ?Gross per 24 hour  ?Intake 500 ml  ?Output 250 ml  ?Net 250 ml  ? ?Filed Weights  ?  08/07/21 1550 08/07/21 2100  ?Weight: 64.4 kg 62.8 kg  ? ?Examination: ? ?General exam: Appears calm and comfortable  ?Respiratory system: Clear to auscultation. Respiratory effort normal. ?Cardiovascular system: normal S1 & S2 heard. No JVD, murmurs, rubs, gallops or clicks. No pedal edema. ?Gastrointestinal system: Abdomen is nondistended, soft and nontender. No organomegaly or masses felt. Normal bowel sounds heard. ?Central nervous system: Alert and oriented. No focal neurological deficits. ?Extremities: Symmetric 5 x 5 power. ?Skin: No rashes, lesions or ulcers. ?Psychiatry: Judgement and insight appear normal. Mood & affect appropriate.  ? ?Data Reviewed: I have personally reviewed following labs and imaging studies ? ?CBC: ?Recent Labs  ?Lab 08/07/21 ?1616 08/08/21 ?0443  ?WBC 13.8* 11.4*  ?NEUTROABS 11.4*  --   ?HGB 13.4 10.6*  ?HCT 41.5 34.6*  ?MCV 90.2 91.3  ?PLT 318 242  ? ? ?Basic Metabolic Panel: ?Recent Labs  ?Lab 08/07/21 ?1616 08/08/21 ?0443  ?NA 140 142  ?K 4.6 3.9  ?CL 100 107  ?CO2 26 25  ?GLUCOSE 162* 111*  ?BUN 43* 36*  ?CREATININE 1.90* 1.61*  ?CALCIUM 10.2 8.8*  ? ? ?CBG: ?Recent Labs  ?Lab 08/08/21 ?0009 08/08/21 ?8115 08/08/21 ?1124  ?GLUCAP 176* 134* 108*  ? ? ?Recent Results (from the past 240 hour(s))  ?Resp Panel by RT-PCR (Flu A&B, Covid) Nasopharyngeal Swab     Status: None  ? Collection Time: 08/07/21  9:10 PM  ? Specimen: Nasopharyngeal Swab; Nasopharyngeal(NP) swabs in vial transport medium  ?Result Value Ref Range Status  ? SARS Coronavirus 2 by RT PCR NEGATIVE NEGATIVE Final  ?  Comment: (NOTE) ?SARS-CoV-2 target nucleic acids are NOT DETECTED. ? ?The SARS-CoV-2 RNA is generally detectable in upper respiratory ?specimens during the acute phase of infection. The lowest ?concentration of SARS-CoV-2 viral copies this assay can detect is ?138 copies/mL. A negative result does not preclude SARS-Cov-2 ?infection and should not be used as the sole basis for treatment or ?other patient  management decisions. A negative result may occur with  ?improper specimen collection/handling, submission of specimen other ?than nasopharyngeal swab, presence of viral mutation(s) within the ?areas targeted by this assay, and inadequate number of viral ?copies(<138 copies/mL). A negative result must be combined with ?clinical observations, patient history, and epidemiological ?information. The expected result is Negative. ? ?Fact Sheet for Patients:  ?EntrepreneurPulse.com.au ? ?Fact Sheet for Healthcare Providers:  ?IncredibleEmployment.be ? ?This test is no t yet approved or cleared by the [place] FDA and  ?has been authorized for detection and/or diagnosis of SARS-CoV-2 by ?FDA under an Emergency Use Authorization (EUA). This EUA will remain  ?in effect (meaning this test can be used) for the duration of the ?COVID-19 declaration under Section 564(b)(1) of the Act, 21 ?U.S.C.section 360bbb-3(b)(1), unless the authorization is terminated  ?or revoked sooner.  ? ? ?  ? Influenza A by PCR NEGATIVE NEGATIVE Final  ? Influenza B by PCR NEGATIVE NEGATIVE Final  ?  Comment: (NOTE) ?The Xpert Xpress  SARS-CoV-2/FLU/RSV plus assay is intended as an aid ?in the diagnosis of influenza from Nasopharyngeal swab specimens and ?should not be used as a sole basis for treatment. Nasal washings and ?aspirates are unacceptable for Xpert Xpress SARS-CoV-2/FLU/RSV ?testing. ? ?Fact Sheet for Patients: ?EntrepreneurPulse.com.au ? ?Fact Sheet for Healthcare Providers: ?IncredibleEmployment.be ? ?This test is not yet approved or cleared by the [place] FDA and ?has been authorized for detection and/or diagnosis of SARS-CoV-2 by ?FDA under an Emergency Use Authorization (EUA). This EUA will remain ?in effect (meaning this test can be used) for the duration of the ?COVID-19 declaration under Section 564(b)(1) of the Act, 21 U.S.C. ?section 360bbb-3(b)(1),  unless the authorization is terminated or ?revoked. ? ?Performed at [facility], [address] [place], [place] [address] ?  ?  ? ?Radiology Studies: ?CT ABDOMEN PELVIS WO CONTRAST ? ?Result Date: 3/1

## 2021-08-08 NOTE — Assessment & Plan Note (Addendum)
Creatinine up to 1.9 baseline 1.6  ?-Suspect this is due to dehydration in the setting of SBO poor oral intake and recurrent emesis ?Renally adjust medications, avoid nephrotoxic agents / dehydration  / hypotension ?-Hold lisinopril, hold torsemide temporarily ?TREATED AND RESOLVED ?

## 2021-08-08 NOTE — Consult Note (Addendum)
[facility] Consult ? ?Reason for Consult: SBO with ventral hernia  ?Referring Physician: Dr. Wynetta Emery  ? ?Chief Complaint   ?Abdominal Pain ?  ? ? ?HPI: [name] is a 88 y.o. female with multiple medical issues GERD, HLD, A fib.  She had an open sigmoid colectomy for diverticulitis/ abscess at some point, hernia repair in 2002 that was a tissue repair followed by an open appendectomy in 2003 and a subsequent open ventral hernia repair with mesh in 2008 all by Dr. Tamala Julian.  She says Dr. Tamala Julian told her that she died during these procedures. She says that she was told she really should not get surgeries again. I have seen her in the past as well as Dr. Arnoldo Morale and everyone agrees given her age and multiple co-morbidities that surgery is not the best option. ? ?She had attempts at NG but this did not go well. She then refused NG tube. She says she is passing gas and that her pain is better. She has not had a BM. She says she has been taking meds recently to have a BM. ? ?Past Medical History:  ?Diagnosis Date  ? Abdominal pain, chronic, right lower quadrant   ? "ever since I had my appendix removed"  ? Atrial fibrillation ([place])   ? Breast cancer ([place])   ? Cancer [facility])   ? left side  ? Chronic back pain   ? Edema   ? GERD (gastroesophageal reflux disease)   ? Hyperlipidemia   ? Hypertension   ? Osteopenia   ? Osteoporosis   ? Ovarian cyst 02/2015  ? left  ? Recurrent abdominal pain   ? LLQ  ? ? ?Past Surgical History:  ?Procedure Laterality Date  ? APPENDECTOMY    ? APPENDECTOMY    ? BREAST SURGERY Left   ? lumpectomy  ? CARDIOVERSION N/A 03/31/2016  ? Procedure: CARDIOVERSION;  Surgeon: Josue Hector, MD;  Location: [facility];  Service: Cardiovascular;  Laterality: N/A;  ? COLON RESECTION    ? COLON SURGERY    ? HERNIA REPAIR    ? HERNIA REPAIR    ? ? ?Family History  ?Problem Relation Age of Onset  ? Diabetes Mother   ? Heart disease Other   ? Arthritis Other   ? Cancer Other   ? Hyperlipidemia  Brother   ? ? ?Social History  ? ?Tobacco Use  ? Smoking status: Never  ? Smokeless tobacco: Never  ?Vaping Use  ? Vaping Use: Never used  ?Substance Use Topics  ? Alcohol use: No  ?  Alcohol/week: 0.0 standard drinks  ? Drug use: No  ? ? ?Medications: I have reviewed the patient's current medications. ?Prior to Admission:  ?Medications Prior to Admission  ?Medication Sig Dispense Refill Last Dose  ? aspirin EC 81 MG tablet Take 81 mg by mouth daily. Swallow whole.   08/07/2021  ? carbamide peroxide (DEBROX) 6.5 % OTIC solution Place 5 drops into both ears 2 (two) times daily as needed (as needed for ear wax build up). 15 mL 0 unknown  ? diltiazem (CARDIZEM CD) 120 MG 24 hr capsule TAKE (1) CAPSULE BY MOUTH ONCE DAILY. (Patient taking differently: Take 120 mg by mouth daily.) 30 capsule 0 08/06/2021  ? HYDROcodone-acetaminophen (NORCO/VICODIN) 5-325 MG tablet TAKE 1 TABLET BY MOUTH EVERY 6 HOURS AS NEEDED. 20 tablet 0 unknown  ? levothyroxine (SYNTHROID) 75 MCG tablet TAKE ONE TABLET BY MOUTH ONCE DAILY. 30 tablet 0 08/06/2021  ? lisinopril (  ZESTRIL) 2.5 MG tablet Take 1 tablet (2.5 mg total) by mouth daily. 90 tablet 0 08/06/2021  ? metoprolol succinate (TOPROL-XL) 50 MG 24 hr tablet TAKE 1 TABLET BY MOUTH DAILY. TAKE WITH OR IMMEDIATELY FOLLOWING A MEAL. 30 tablet 0 08/06/2021 at 2200  ? pantoprazole (PROTONIX) 40 MG tablet TAKE 1 TABLET BY MOUTH ONCE DAILY. 30 tablet 0 08/06/2021  ? polyethylene glycol powder (GLYCOLAX/MIRALAX) 17 GM/SCOOP powder Take 17 g by mouth daily as needed.   unknown  ? potassium chloride (KLOR-CON) 10 MEQ tablet TAKE ONE TABLET BY MOUTH ONCE DAILY. 30 tablet 6 08/06/2021  ? QC ACETAMINOPHEN 8 HOURS 650 MG CR tablet Take 1 tablet by mouth in the morning and at bedtime.   08/06/2021  ? sertraline (ZOLOFT) 50 MG tablet TAKE 1 TABLET BY MOUTH ONCE A DAY. 30 tablet 6 08/06/2021  ? torsemide (DEMADEX) 20 MG tablet TAKE ONE TABLET BY MOUTH ONCE EVERY MORNING. (Patient taking differently: Take 20 mg by  mouth daily.) 30 tablet 0 08/06/2021  ? trolamine salicylate (ASPERCREME) 10 % cream Apply 1 application topically as needed (feet and shoulders).   unknown  ? vitamin B-12 1000 MCG tablet Take 1 tablet (1,000 mcg total) by mouth daily. 30 tablet 1 08/06/2021  ? ciprofloxacin (CIPRO) 250 MG tablet One bid for 5 days (Patient not taking: Reported on 08/07/2021) 10 tablet 0 Not Taking  ? ondansetron (ZOFRAN ODT) 8 MG disintegrating tablet Take 1 tablet (8 mg total) by mouth every 8 (eight) hours as needed for nausea or vomiting. (Patient not taking: Reported on 07/22/2021) 10 tablet 0 Not Taking  ? ?Scheduled: ? heparin  5,000 Units Subcutaneous Q8H  ? metoprolol tartrate  5 mg Intravenous Q8H  ? sodium chloride flush  3 mL Intravenous Q12H  ? sodium chloride flush  3 mL Intravenous Q12H  ? ?Continuous: ? sodium chloride    ? dextrose 5 % and 0.45% NaCl 75 mL/hr at 08/07/21 2211  ? ?NWG:NFAOZH chloride, acetaminophen **OR** acetaminophen, ALPRAZolam, bisacodyl, fentaNYL (SUBLIMAZE) injection, labetalol, ondansetron **OR** ondansetron (ZOFRAN) IV, polyethylene glycol, sodium chloride flush, traZODone ? ?Allergies  ?Allergen Reactions  ? Amiodarone Itching  ? Cephalosporins Itching  ? Meloxicam Itching  ? Robaxin [Methocarbamol] Itching  ? Amoxicillin Hives  ? Lovenox [Enoxaparin Sodium] Nausea And Vomiting  ? Morphine   ? Morphine And Related Itching  ? Penicillins Swelling  ?   ?  ? Sulfa Antibiotics Swelling  ? Lipitor [Atorvastatin] Other (See Comments)  ?  Unknown:patient is not aware of this allergy  ? Lorazepam Other (See Comments)  ?  Exceptional fatigue ?  ? Macrobid [Nitrofurantoin] Other (See Comments)  ?  Constipation  ? ? ? ?ROS:  ?A comprehensive review of systems was negative except for: Gastrointestinal: positive for abdominal pain and nausea ? ?Blood pressure (!) 159/59, pulse (!) 59, temperature 97.8 ?F (36.6 ?C), temperature source Oral, resp. rate 16, height '5\' 3"'$  (1.6 m), weight 62.8 kg, SpO2 99  %. ?Physical Exam ?Vitals reviewed.  ?Constitutional:   ?   Appearance: She is well-developed.  ?HENT:  ?   Head: Normocephalic.  ?Cardiovascular:  ?   Rate and Rhythm: Normal rate.  ?Pulmonary:  ?   Effort: Pulmonary effort is normal.  ?Abdominal:  ?   General: There is no distension.  ?   Palpations: Abdomen is soft.  ?   Tenderness: There is abdominal tenderness in the suprapubic area.  ?   Hernia: A hernia is present. Hernia is present  in the ventral area.  ?   Comments: Ventral hernia above the suprapubic region, reducible small bowel, some tenderness in the region  ?Skin: ?   General: Skin is warm.  ?Neurological:  ?   General: No focal deficit present.  ?   Mental Status: She is alert.  ?Psychiatric:     ?   Mood and Affect: Mood normal.  ? ? ?Results: ?Results for orders placed or performed during the hospital encounter of 08/07/21 (from the past 48 hour(s))  ?CBC with Differential/Platelet     Status: Abnormal  ? Collection Time: 08/07/21  4:16 PM  ?Result Value Ref Range  ? WBC 13.8 (H) 4.0 - 10.5 K/uL  ? RBC 4.60 3.87 - 5.11 MIL/uL  ? Hemoglobin 13.4 12.0 - 15.0 g/dL  ? HCT 41.5 36.0 - 46.0 %  ? MCV 90.2 80.0 - 100.0 fL  ? MCH 29.1 26.0 - 34.0 pg  ? MCHC 32.3 30.0 - 36.0 g/dL  ? RDW 12.2 11.5 - 15.5 %  ? Platelets 318 150 - 400 K/uL  ? nRBC 0.0 0.0 - 0.2 %  ? Neutrophils Relative % 84 %  ? Neutro Abs 11.4 (H) 1.7 - 7.7 K/uL  ? Lymphocytes Relative 12 %  ? Lymphs Abs 1.7 0.7 - 4.0 K/uL  ? Monocytes Relative 4 %  ? Monocytes Absolute 0.6 0.1 - 1.0 K/uL  ? Eosinophils Relative 0 %  ? Eosinophils Absolute 0.0 0.0 - 0.5 K/uL  ? Basophils Relative 0 %  ? Basophils Absolute 0.0 0.0 - 0.1 K/uL  ? Immature Granulocytes 0 %  ? Abs Immature Granulocytes 0.06 0.00 - 0.07 K/uL  ?  Comment: Performed at [facility], [address] [place], [place] [address]  ?Comprehensive metabolic panel     Status: Abnormal  ? Collection Time: 08/07/21  4:16 PM  ?Result Value Ref Range  ? Sodium 140 135 - 145 mmol/L  ? Potassium 4.6  3.5 - 5.1 mmol/L  ? Chloride 100 98 - 111 mmol/L  ? CO2 26 22 - 32 mmol/L  ? Glucose, Bld 162 (H) 70 - 99 mg/dL  ?  Comment: Glucose reference range applies only to samples taken after fasting for at least 8 hou

## 2021-08-08 NOTE — Assessment & Plan Note (Addendum)
CTAP consistent with Small bowel obstruction with transition point in the previously seen small inferior abdominal wall ventral hernia just superior to the pubic symphysis ?CT done 3/20 shows resolution of bowel obstruction ?Pt tolerating soft food diet and having bowel movements.  ?DC home today.  Discussed with surgery.  Follow up with PCP.  ?

## 2021-08-09 ENCOUNTER — Inpatient Hospital Stay (HOSPITAL_COMMUNITY): Payer: Medicare Other

## 2021-08-09 ENCOUNTER — Encounter (HOSPITAL_COMMUNITY): Payer: Self-pay | Admitting: Radiology

## 2021-08-09 ENCOUNTER — Other Ambulatory Visit: Payer: Self-pay | Admitting: Family Medicine

## 2021-08-09 DIAGNOSIS — K56609 Unspecified intestinal obstruction, unspecified as to partial versus complete obstruction: Secondary | ICD-10-CM | POA: Diagnosis not present

## 2021-08-09 DIAGNOSIS — I4811 Longstanding persistent atrial fibrillation: Secondary | ICD-10-CM | POA: Diagnosis not present

## 2021-08-09 DIAGNOSIS — F419 Anxiety disorder, unspecified: Secondary | ICD-10-CM | POA: Diagnosis not present

## 2021-08-09 DIAGNOSIS — N179 Acute kidney failure, unspecified: Secondary | ICD-10-CM | POA: Diagnosis not present

## 2021-08-09 DIAGNOSIS — M10379 Gout due to renal impairment, unspecified ankle and foot: Secondary | ICD-10-CM | POA: Diagnosis not present

## 2021-08-09 LAB — BASIC METABOLIC PANEL
Anion gap: 9 (ref 5–15)
BUN: 25 mg/dL — ABNORMAL HIGH (ref 8–23)
CO2: 24 mmol/L (ref 22–32)
Calcium: 8.6 mg/dL — ABNORMAL LOW (ref 8.9–10.3)
Chloride: 109 mmol/L (ref 98–111)
Creatinine, Ser: 1.4 mg/dL — ABNORMAL HIGH (ref 0.44–1.00)
GFR, Estimated: 35 mL/min — ABNORMAL LOW (ref >60)
Glucose, Bld: 96 mg/dL (ref 70–99)
Potassium: 3.7 mmol/L (ref 3.5–5.1)
Sodium: 142 mmol/L (ref 135–145)

## 2021-08-09 LAB — GLUCOSE, CAPILLARY
Glucose-Capillary: 98 mg/dL (ref 70–99)
Glucose-Capillary: 98 mg/dL (ref 70–99)
Glucose-Capillary: 138 mg/dL — ABNORMAL HIGH (ref 70–99)

## 2021-08-09 LAB — CBC
HCT: 32.4 % — ABNORMAL LOW (ref 36.0–46.0)
Hemoglobin: 10.3 g/dL — ABNORMAL LOW (ref 12.0–15.0)
MCH: 29.8 pg (ref 26.0–34.0)
MCHC: 31.8 g/dL (ref 30.0–36.0)
MCV: 93.6 fL (ref 80.0–100.0)
Platelets: 219 10*3/uL (ref 150–400)
RBC: 3.46 MIL/uL — ABNORMAL LOW (ref 3.87–5.11)
RDW: 12.1 % (ref 11.5–15.5)
WBC: 6.9 10*3/uL (ref 4.0–10.5)
nRBC: 0 % (ref 0.0–0.2)

## 2021-08-09 LAB — MAGNESIUM: Magnesium: 2.3 mg/dL (ref 1.7–2.4)

## 2021-08-09 LAB — URIC ACID: Uric Acid, Serum: 8.2 mg/dL — ABNORMAL HIGH (ref 2.5–7.1)

## 2021-08-09 MED ORDER — IOHEXOL 300 MG/ML  SOLN
100.0000 mL | Freq: Once | INTRAMUSCULAR | Status: AC | PRN
  Administered 2021-08-09: 75 mL via INTRAVENOUS

## 2021-08-09 MED ORDER — FENTANYL CITRATE PF 50 MCG/ML IJ SOSY
12.5000 ug | PREFILLED_SYRINGE | Freq: Once | INTRAMUSCULAR | Status: AC
  Administered 2021-08-09: 12.5 ug via INTRAVENOUS
  Filled 2021-08-09: qty 1

## 2021-08-09 MED ORDER — PREDNISONE 20 MG PO TABS
30.0000 mg | ORAL_TABLET | Freq: Once | ORAL | Status: AC
  Administered 2021-08-09: 30 mg via ORAL
  Filled 2021-08-09: qty 2

## 2021-08-09 NOTE — Progress Notes (Signed)
?  Transition of Care (TOC) Screening Note ? ? ?Patient Details  ?Name: [name] ?Date of Birth: 1923-10-02 ? ? ?Transition of Care (TOC) CM/SW Contact:    ?Boneta Lucks, RN ?Phone Number: ?08/09/2021, 12:04 PM ? ? ? ?Transition of Care Department [facility]) has reviewed patient and no TOC needs have been identified at this time. We will continue to monitor patient advancement through interdisciplinary progression rounds. If new patient transition needs arise, please place a TOC consult. ? ? ?

## 2021-08-09 NOTE — Progress Notes (Signed)
?PROGRESS NOTE ? ? ?[name]  RKY:[id number] DOB: [birth year] DOA: 08/07/2021 ?PCP: Katherine Drown, MD  ? ?Chief Complaint  ?Patient presents with  ? Abdominal Pain  ? ?Level of care: Med-Surg ? ?Brief Admission History:  ?88 y.o. female with past medical history relevant for A-fib a flutter, history of GI bleed and prior History of ICH/SAH 06/2017, hypothyroidism, HTN, anxiety who presents with abdominal pain of 24 hours duration associated with emesis emesis is without bile or blood ?-Additional history obtained from patient's son [name] at bedside ?-No diarrhea, no BM in the last 24 hours ?-In ED patient had transient hypoxia and was placed on 2 L of oxygen via nasal cannula ?No fever  Or chills  ? ?Patient had emesis in the ED, patient's son [name] at bedside--- RN attempted to place NG tube--- patient and son requested no further attempts the patient does not want NG tube at this time ? ?-CTAP consistent with Small bowel obstruction with transition point in the previously seen small inferior abdominal wall ventral hernia just superior to the pubic symphysis ?-WBC 13.8 ?-Creatinine 1.90, BUN 43 sodium 140 potassium 4.6 ?-LFTs are not elevated ?-Lipase is 33 ?-EDP discussed case with on-call general surgeon [name] who will consult.  Pt is NOT at all interested in having any surgery done given her advanced age and her heart condition.  She is very attuned to the situation and is  hoping that she will improve with conservative management.   ? ?08/08/2021: Pt having pain from her chronic pain issues but not having a lot of abdominal pain or discomfort in the abdomen.   ? ?08/09/2021:  Pt developed severe acute left foot pain with elevated uric acid levels thought secondary to acute gout attack.  Pt had multiple bowel movements in last 24 hours.  Abd xrays ordered by surgery.   ?  ?Assessment and Plan: ?* SBO (small bowel obstruction) ([place]) ?CTAP consistent with Small bowel obstruction with transition point  in the previously seen small inferior abdominal wall ventral hernia just superior to the pubic symphysis ?-WBC 13.8, -LFTs are not elevated, -Lipase is 33 ?-Official surgical consult pending ?-Patient declines NG tube-- please see HPI ?-N.p.o. /sips with meds for now ?- IV fluids ?-Antiemetics ?-Patient declined further attempts to have NG tube placed. ? ?Xrays reviewed 08/09/21 ? ? ?Acute gout due to renal impairment involving foot ?Pt c/o severe left foot pain.  Elevated uric acid level.  ?Prednisone 30 mg tablet given 3/20.   ?Continue IV fluid hydration.  ? ?Ventral hernia ?Hernia reduced by [name] 3/19.  ?Laxatives ordered (dulcolax suppository) daily.   ? ?AKI (acute kidney injury) on stage 3b CKD  ?Creatinine up to 1.9 baseline 1.6  ?-Suspect this is due to dehydration in the setting of SBO poor oral intake and recurrent emesis ?Renally adjust medications, avoid nephrotoxic agents / dehydration  / hypotension ?-Hold lisinopril, hold torsemide ?-Gentle IV fluids pending return of bowel function and tolerance of oral intake ?-recheck BMP in AM.  ? ?Atrial fibrillation ([place]) ?previously on Xarelto which was discontinued due to GI bleed, patient also has history of ICH/SAH  back in February 2019 ?-resume home Toprol-XL at lower dose 25 mg starting 3/20 ?-resume home Cardizem CD 120 mg ?-resume home aspirin ?-Last echo from February 2019 with EF of 55 to 60%, no AS, no MS, there was mild left atrial enlargement ? ?Anxiety ?Alprazolam ordered as needed.   ? ?Hypothyroid ?Stable, controlled on home meds.  ?-  resume home levothyroxine 3/20 ? ?DVT prophylaxis: [place] heparin  ?Code Status: full  ?Family Communication: granddaughter 3/19 ?Disposition: Status is: Inpatient ?Remains inpatient appropriate because: IV fluid required  ?  ?Consultants:  ?Surgery  ?Procedures:  ?N/a refused NG placement  ?Antimicrobials:  ?N/a   ?Subjective: ?Pt developed severe left foot pain overnight, required IV pain meds.    ?Objective: ?Vitals:  ? 08/08/21 1451 08/08/21 2232 08/09/21 0631 08/09/21 1000  ?BP: (!) 148/71 134/66 (!) 148/64 (!) 147/61  ?Pulse: (!) 57 63 (!) 58 60  ?Resp: '20 20 20 18  '$ ?Temp: 98.6 ?F (37 ?C) 98.4 ?F (36.9 ?C) 97.9 ?F (36.6 ?C)   ?TempSrc: Oral Oral Oral   ?SpO2: 97% 95% 99% 99%  ?Weight:      ?Height:      ? ? ?Intake/Output Summary (Last 24 hours) at 08/09/2021 1221 ?Last data filed at 08/09/2021 1011 ?Gross per 24 hour  ?Intake 3 ml  ?Output --  ?Net 3 ml  ? ?Filed Weights  ? 08/07/21 1550 08/07/21 2100  ?Weight: 64.4 kg 62.8 kg  ? ?Examination: ? ?General exam: Appears calm and comfortable  ?Respiratory system: Clear to auscultation. Respiratory effort normal. ?Cardiovascular system: normal S1 & S2 heard. No JVD, murmurs, rubs, gallops or clicks. No pedal edema. ?Gastrointestinal system: Abdomen is nondistended, soft and nontender. No organomegaly or masses felt. Normal bowel sounds heard. ?Central nervous system: Alert and oriented. No focal neurological deficits. ?Extremities: left foot no redness, no sign of infection, Symmetric 5 x 5 power. ?Skin: No rashes, lesions or ulcers. ?Psychiatry: Judgement and insight appear normal. Mood & affect appropriate.  ? ?Data Reviewed: I have personally reviewed following labs and imaging studies ? ?CBC: ?Recent Labs  ?Lab 08/07/21 ?1616 08/08/21 ?0443 08/09/21 ?0406  ?WBC 13.8* 11.4* 6.9  ?NEUTROABS 11.4*  --   --   ?HGB 13.4 10.6* 10.3*  ?HCT 41.5 34.6* 32.4*  ?MCV 90.2 91.3 93.6  ?PLT 318 242 219  ? ? ?Basic Metabolic Panel: ?Recent Labs  ?Lab 08/07/21 ?1616 08/08/21 ?0443 08/09/21 ?0406  ?NA 140 142 142  ?K 4.6 3.9 3.7  ?CL 100 107 109  ?CO2 '26 25 24  '$ ?GLUCOSE 162* 111* 96  ?BUN 43* 36* 25*  ?CREATININE 1.90* 1.61* 1.40*  ?CALCIUM 10.2 8.8* 8.6*  ?MG  --   --  2.3  ? ? ?CBG: ?Recent Labs  ?Lab 08/08/21 ?1124 08/08/21 ?1830 08/08/21 ?2231 08/09/21 ?0622 08/09/21 ?1117  ?GLUCAP 108* 105* 108* 98 98  ? ? ?Recent Results (from the past 240 hour(s))  ?Resp Panel by  RT-PCR (Flu A&B, Covid) Nasopharyngeal Swab     Status: None  ? Collection Time: 08/07/21  9:10 PM  ? Specimen: Nasopharyngeal Swab; Nasopharyngeal(NP) swabs in vial transport medium  ?Result Value Ref Range Status  ? SARS Coronavirus 2 by RT PCR NEGATIVE NEGATIVE Final  ?  Comment: (NOTE) ?SARS-CoV-2 target nucleic acids are NOT DETECTED. ? ?The SARS-CoV-2 RNA is generally detectable in upper respiratory ?specimens during the acute phase of infection. The lowest ?concentration of SARS-CoV-2 viral copies this assay can detect is ?138 copies/mL. A negative result does not preclude SARS-Cov-2 ?infection and should not be used as the sole basis for treatment or ?other patient management decisions. A negative result may occur with  ?improper specimen collection/handling, submission of specimen other ?than nasopharyngeal swab, presence of viral mutation(s) within the ?areas targeted by this assay, and inadequate number of viral ?copies(<138 copies/mL). A negative result must be combined with ?clinical  observations, patient history, and epidemiological ?information. The expected result is Negative. ? ?Fact Sheet for Patients:  ?EntrepreneurPulse.com.au ? ?Fact Sheet for Healthcare Providers:  ?IncredibleEmployment.be ? ?This test is no t yet approved or cleared by the [place] FDA and  ?has been authorized for detection and/or diagnosis of SARS-CoV-2 by ?FDA under an Emergency Use Authorization (EUA). This EUA will remain  ?in effect (meaning this test can be used) for the duration of the ?COVID-19 declaration under Section 564(b)(1) of the Act, 21 ?U.S.C.section 360bbb-3(b)(1), unless the authorization is terminated  ?or revoked sooner.  ? ? ?  ? Influenza A by PCR NEGATIVE NEGATIVE Final  ? Influenza B by PCR NEGATIVE NEGATIVE Final  ?  Comment: (NOTE) ?The Xpert Xpress SARS-CoV-2/FLU/RSV plus assay is intended as an aid ?in the diagnosis of influenza from Nasopharyngeal swab  specimens and ?should not be used as a sole basis for treatment. Nasal washings and ?aspirates are unacceptable for Xpert Xpress SARS-CoV-2/FLU/RSV ?testing. ? ?Fact Sheet for Patients: ?BeginnerSteps.be

## 2021-08-09 NOTE — Assessment & Plan Note (Addendum)
Pt c/o severe left foot pain.  Elevated uric acid level.  ?Prednisone 30 mg tablet given 3/20.   ?Treated with IV fluid hydration. ?We have reduced diuretic to every other day to try to avoid gout attack. ?Allopurinol 50 mg daily to reduce high uric acid levels.  ?

## 2021-08-09 NOTE — Progress Notes (Signed)
[facility] Progress Note ? ?   ?Subjective: ?Still with Kub with distention and concern for SBO. I reduced hernia yesterday. Still feels reduced but tender. ? ?No nausea and had BM.  ? ?Objective: ?Vital signs in last 24 hours: ?Temp:  [97.9 ?F (36.6 ?C)-98.6 ?F (37 ?C)] 98 ?F (36.7 ?C) (03/20 1428) ?Pulse Rate:  [57-72] 72 (03/20 1428) ?Resp:  [18-20] 20 (03/20 1428) ?BP: (134-177)/(61-75) 177/75 (03/20 1428) ?SpO2:  [94 %-99 %] 94 % (03/20 1428) ?Last BM Date : 08/08/21 ? ?Intake/Output from previous day: ?No intake/output data recorded. ?Intake/Output this shift: ?Total I/O ?In: 3 [I.V.:3] ?Out: -  ? ?General appearance: alert and no distress ?GI: soft, nondistended, tender above the pubis, no obvious bowel in hernia, reduced earlier ? ?Lab Results:  ?Recent Labs  ?  08/08/21 ?0443 08/09/21 ?0406  ?WBC 11.4* 6.9  ?HGB 10.6* 10.3*  ?HCT 34.6* 32.4*  ?PLT 242 219  ? ?BMET ?Recent Labs  ?  08/08/21 ?0443 08/09/21 ?0406  ?NA 142 142  ?K 3.9 3.7  ?CL 107 109  ?CO2 25 24  ?GLUCOSE 111* 96  ?BUN 36* 25*  ?CREATININE 1.61* 1.40*  ?CALCIUM 8.8* 8.6*  ? ?PT/INR ?No results for input(s): LABPROT, INR in the last 72 hours. ? ?Studies/Results: ?CT ABDOMEN PELVIS WO CONTRAST ? ?Result Date: 08/07/2021 ?CLINICAL DATA:  Abdominal pain and vomiting. EXAM: CT ABDOMEN AND PELVIS WITHOUT CONTRAST TECHNIQUE: Multidetector CT imaging of the abdomen and pelvis was performed following the standard protocol without IV contrast. RADIATION DOSE REDUCTION: This exam was performed according to the departmental dose-optimization program which includes automated exposure control, adjustment of the mA and/or kV according to patient size and/or use of iterative reconstruction technique. COMPARISON:  CT abdomen pelvis dated February 09, 2021. FINDINGS: Lower chest: No acute abnormality. Chronic bronchiectasis and scarring at both lung bases, similar to prior study. Hepatobiliary: No focal liver abnormality is seen. No  gallstones, gallbladder wall thickening, or biliary dilatation. Pancreas: Unremarkable. No pancreatic ductal dilatation or surrounding inflammatory changes. Spleen: Normal in size without focal abnormality. Adrenals/Urinary Tract: Adrenal glands are unremarkable. Similar asymmetric right renal atrophy. No renal calculi or hydronephrosis. The bladder is unremarkable. Stomach/Bowel: Unchanged small hiatal hernia. Stomach is otherwise within normal limits. Unchanged large duodenal diverticulum. There are multiple dilated loops of small bowel with transition point in the previously seen small inferior abdominal wall ventral hernia a few centimeters above the pubic symphysis. (Series 2, image 66; series 6, image 65; series 5, image 25). No bowel wall thickening or pneumatosis. Colonic diverticulosis. History of prior appendectomy. Vascular/Lymphatic: Aortic atherosclerosis. No enlarged abdominal or pelvic lymph nodes. Reproductive: Uterus and bilateral adnexa are unremarkable. Other: No free fluid or pneumoperitoneum. Musculoskeletal: No acute or significant osseous findings. IMPRESSION: 1. Small bowel obstruction with transition point in the previously seen small inferior abdominal wall ventral hernia just superior to the pubic symphysis. 2. Aortic Atherosclerosis (ICD10-I70.0). Electronically Signed   By: Titus Dubin M.D.   On: 08/07/2021 17:02  ? ?DG Abd 1 View ? ?Result Date: 08/09/2021 ?CLINICAL DATA:  Small-bowel obstruction EXAM: ABDOMEN - 1 VIEW COMPARISON:  08/08/2021 FINDINGS: Persistent dilation of multiple small bowel loops throughout the abdomen, similar in caliber to the previous study. No gross free intraperitoneal air on supine imaging. Atherosclerotic calcifications are present throughout. IMPRESSION: Persistent small bowel obstruction. No significant change from prior. Electronically Signed   By: Davina Poke D.O.   On: 08/09/2021 13:09  ? ?DG Abd 1 View ? ?Result  Date: 08/08/2021 ?CLINICAL DATA:   Abdominal pain EXAM: ABDOMEN - 1 VIEW COMPARISON:  Previous studies including the radiographs done earlier today FINDINGS: There is mild dilation of small-bowel loops. Dilated small bowel loops measure up to 3.1 cm in diameter. There is interval increase in amount of small-bowel gas in the left mid abdomen. Gas and stool are present in the colon. Stomach is not distended. IMPRESSION: There is mild dilation of small-bowel loops. There is interval increase in amount small-bowel gas in the left mid abdomen. Findings suggest partial small bowel obstruction. Electronically Signed   By: Elmer Picker M.D.   On: 08/08/2021 14:16  ? ?DG ABD ACUTE 2+V W 1V CHEST ? ?Result Date: 08/08/2021 ?CLINICAL DATA:  Small bowel obstruction. EXAM: DG ABDOMEN ACUTE WITH 1 VIEW CHEST COMPARISON:  CT abdomen pelvis from yesterday. Chest x-ray dated September 14, 2020. FINDINGS: There are several borderline dilated loops of small bowel in the right abdomen. No free intraperitoneal air. No radiopaque calculi or other significant radiographic abnormality is seen. Heart size and mediastinal contours are within normal limits. Mild left basilar atelectasis. No pleural effusion or pneumothorax. IMPRESSION: 1. Unchanged small bowel obstruction. 2. No acute cardiopulmonary disease. Electronically Signed   By: Titus Dubin M.D.   On: 08/08/2021 10:14   ? ?Anti-infectives: ?Anti-infectives (From admission, onward)  ? ? None  ? ?  ? ? ?Assessment/Plan: ?Patient with SBO in ventral hernia. Seems reduced to me. Patient wants to avoid surgery if at all possible. ?CT with oral contrast ordered to reassess given clinically improvement but continued SBO on KUB and tenderness on exam in that one area  ?Updated team ?If continues to have SBO will need cards to weigh in on risk for any surgery tomorrow  ? ? ? LOS: 2 days  ? ? ?Virl Cagey ?08/09/2021  ?

## 2021-08-09 NOTE — Progress Notes (Signed)
[facility] ? ?Bowel obstruction resolved.  Bowel still in hernia but i reduce it easy.  ? ?Clear diet. ? ?Curlene Labrum, Md

## 2021-08-10 DIAGNOSIS — M10372 Gout due to renal impairment, left ankle and foot: Secondary | ICD-10-CM | POA: Diagnosis not present

## 2021-08-10 DIAGNOSIS — I4811 Longstanding persistent atrial fibrillation: Secondary | ICD-10-CM | POA: Diagnosis not present

## 2021-08-10 DIAGNOSIS — K439 Ventral hernia without obstruction or gangrene: Secondary | ICD-10-CM

## 2021-08-10 DIAGNOSIS — K56609 Unspecified intestinal obstruction, unspecified as to partial versus complete obstruction: Secondary | ICD-10-CM | POA: Diagnosis not present

## 2021-08-10 DIAGNOSIS — N179 Acute kidney failure, unspecified: Secondary | ICD-10-CM | POA: Diagnosis not present

## 2021-08-10 LAB — BASIC METABOLIC PANEL
Anion gap: 7 (ref 5–15)
BUN: 19 mg/dL (ref 8–23)
CO2: 24 mmol/L (ref 22–32)
Calcium: 8.8 mg/dL — ABNORMAL LOW (ref 8.9–10.3)
Chloride: 112 mmol/L — ABNORMAL HIGH (ref 98–111)
Creatinine, Ser: 1.17 mg/dL — ABNORMAL HIGH (ref 0.44–1.00)
GFR, Estimated: 43 mL/min — ABNORMAL LOW (ref >60)
Glucose, Bld: 102 mg/dL — ABNORMAL HIGH (ref 70–99)
Potassium: 3.8 mmol/L (ref 3.5–5.1)
Sodium: 143 mmol/L (ref 135–145)

## 2021-08-10 LAB — GLUCOSE, CAPILLARY
Glucose-Capillary: 103 mg/dL — ABNORMAL HIGH (ref 70–99)
Glucose-Capillary: 129 mg/dL — ABNORMAL HIGH (ref 70–99)
Glucose-Capillary: 88 mg/dL (ref 70–99)

## 2021-08-10 MED ORDER — TORSEMIDE 20 MG PO TABS: 20.0000 mg | ORAL_TABLET | ORAL | 0 refills | Status: DC

## 2021-08-10 MED ORDER — POTASSIUM CHLORIDE ER 10 MEQ PO TBCR: 10.0000 meq | EXTENDED_RELEASE_TABLET | ORAL | 0 refills | Status: DC

## 2021-08-10 MED ORDER — DOCUSATE SODIUM 100 MG PO CAPS: 100.0000 mg | ORAL_CAPSULE | Freq: Two times a day (BID) | ORAL | 2 refills | Status: DC

## 2021-08-10 MED ORDER — ASPIRIN EC 81 MG PO TBEC: 81.0000 mg | DELAYED_RELEASE_TABLET | Freq: Every day | ORAL | 2 refills | Status: DC

## 2021-08-10 MED ORDER — HYDROCODONE-ACETAMINOPHEN 5-325 MG PO TABS
1.0000 | ORAL_TABLET | ORAL | Status: DC | PRN
  Administered 2021-08-10: 1 via ORAL
  Filled 2021-08-10: qty 1

## 2021-08-10 MED ORDER — ALLOPURINOL 100 MG PO TABS: 50.0000 mg | ORAL_TABLET | Freq: Every day | ORAL | 1 refills | Status: DC

## 2021-08-10 MED ORDER — POTASSIUM CHLORIDE ER 10 MEQ PO TBCR: 10.0000 meq | EXTENDED_RELEASE_TABLET | ORAL | 6 refills | Status: DC

## 2021-08-10 NOTE — Discharge Summary (Signed)
Physician Discharge Summary  ?[name] HGD:[id number] DOB: 1923-07-02 DOA: 08/07/2021 ? ?PCP: [name], MD ?SURG: Dr. Constance Haw ? ?Admit date: 08/07/2021 ?Discharge date: 08/10/2021 ? ?Admitted From:  Home  ?Disposition: Home  ? ?Recommendations for Outpatient Follow-up:  ?Follow up with PCP in 1 weeks ?Reduced diuretic due to acute gout attack and high uric acid level ?Please check uric acid level in 2 months ? ?Discharge Condition: STABLE   ?CODE STATUS: FULL ?DIET: Soft foods recommended   ? ?Brief Hospitalization Summary: ?Please see all hospital notes, images, labs for full details of the hospitalization. ?88 y.o. female with past medical history relevant for A-fib a flutter, history of GI bleed and prior History of ICH/SAH 06/2017, hypothyroidism, HTN, anxiety who presents with abdominal pain of 24 hours duration associated with emesis emesis is without bile or blood ?-Additional history obtained from patient's son [name] at bedside ?-No diarrhea, no BM in the last 24 hours ?-In ED patient had transient hypoxia and was placed on 2 L of oxygen via nasal cannula ?No fever  Or chills  ? ?Patient had emesis in the ED, patient's son [name] at bedside--- RN attempted to place NG tube--- patient and son requested no further attempts the patient does not want NG tube at this time ? ?-CTAP consistent with Small bowel obstruction with transition point in the previously seen small inferior abdominal wall ventral hernia just superior to the pubic symphysis ?-WBC 13.8 ?-Creatinine 1.90, BUN 43 sodium 140 potassium 4.6 ?-LFTs are not elevated ?-Lipase is 33 ?-EDP discussed case with on-call general surgeon Dr. Constance Haw who will consult.  Pt is NOT at all interested in having any surgery done given her advanced age and her heart condition.  She is very attuned to the situation and is  hoping that she will improve with conservative management.   ? ?08/08/2021: Pt having pain from her chronic pain issues but not having a  lot of abdominal pain or discomfort in the abdomen.   ? ?08/09/2021:  Pt developed severe acute left foot pain with elevated uric acid levels thought secondary to acute gout attack.  Pt had multiple bowel movements in last 24 hours.  Abd xrays ordered by surgery.   CT scan shows resolution of SBO.  Diet started and advanced.  ? ?08/10/2021:  tolerating soft food diet, feeling much better, discussed with surgery ok to discharge home today.  Follow up with PCP.  ? ?HOSPITAL COURSE BY PROBLEM LIST ? ?Assessment and Plan: ?* SBO (small bowel obstruction) ([place]) ?CTAP consistent with Small bowel obstruction with transition point in the previously seen small inferior abdominal wall ventral hernia just superior to the pubic symphysis ?CT done 3/20 shows resolution of bowel obstruction ?Pt tolerating soft food diet and having bowel movements.  ?DC home today.  Discussed with surgery.  Follow up with PCP.  ? ?Acute gout due to renal impairment involving foot ?Pt c/o severe left foot pain.  Elevated uric acid level.  ?Prednisone 30 mg tablet given 3/20.   ?Treated with IV fluid hydration. ?We have reduced diuretic to every other day to try to avoid gout attack. ?Allopurinol 50 mg daily to reduce high uric acid levels.  ? ?Ventral hernia ?Hernia reduced by Dr. Constance Haw 3/19.  ?Laxatives ordered (dulcolax suppository) daily.   ? ?AKI (acute kidney injury) on stage 3b CKD  ?Creatinine up to 1.9 baseline 1.6  ?-Suspect this is due to dehydration in the setting of SBO poor oral intake and recurrent emesis ?  Renally adjust medications, avoid nephrotoxic agents / dehydration  / hypotension ?-Hold lisinopril, hold torsemide temporarily ?TREATED AND RESOLVED ? ?Atrial fibrillation ([place]) ?previously on Xarelto which was discontinued due to GI bleed, patient also has history of ICH/SAH  back in February 2019 ?-resume home Toprol-XL at lower dose 25 mg starting 3/20 ?-resume home Cardizem CD 120 mg ?-resume home aspirin ?-Last echo from  February 2019 with EF of 55 to 60%, no AS, no MS, there was mild left atrial enlargement ? ?Anxiety ?Alprazolam ordered as needed in hospital only.   ? ?Hypothyroid ?Stable, controlled on home meds.  ?-resume home levothyroxine ? ?Discharge Diagnoses:  ?Principal Problem: ?  SBO (small bowel obstruction) ([place]) ?Active Problems: ?  Ventral hernia ?  Acute gout due to renal impairment involving foot ?  Anxiety ?  Atrial fibrillation ([place]) ?  AKI (acute kidney injury) on stage 3b CKD  ?  Hypothyroid ? ? ?Discharge Instructions: ? ?Allergies as of 08/10/2021   ? ?   Reactions  ? Amiodarone Itching  ? Cephalosporins Itching  ? Meloxicam Itching  ? Robaxin [methocarbamol] Itching  ? Amoxicillin Hives  ? Lovenox [enoxaparin Sodium] Nausea And Vomiting  ? Morphine   ? Morphine And Related Itching  ? Penicillins Swelling  ?   ? Sulfa Antibiotics Swelling  ? Lipitor [atorvastatin] Other (See Comments)  ? Unknown:patient is not aware of this allergy  ? Lorazepam Other (See Comments)  ? Exceptional fatigue  ? Macrobid [nitrofurantoin] Other (See Comments)  ? Constipation  ? ?  ? ?  ?Medication List  ?  ? ?STOP taking these medications   ? ?ciprofloxacin 250 MG tablet ?Commonly known as: CIPRO ?  ?ondansetron 8 MG disintegrating tablet ?Commonly known as: Zofran ODT ?  ? ?  ? ?TAKE these medications   ? ?allopurinol 100 MG tablet ?Commonly known as: ZYLOPRIM ?Take 0.5 tablets (50 mg total) by mouth daily. ?  ?aspirin EC 81 MG tablet ?Take 81 mg by mouth daily. Swallow whole. ?  ?carbamide peroxide 6.5 % OTIC solution ?Commonly known as: DEBROX ?Place 5 drops into both ears 2 (two) times daily as needed (as needed for ear wax build up). ?  ?cyanocobalamin 1000 MCG tablet ?Take 1 tablet (1,000 mcg total) by mouth daily. ?  ?diltiazem 120 MG 24 hr capsule ?Commonly known as: CARDIZEM CD ?TAKE (1) CAPSULE BY MOUTH ONCE DAILY. ?What changed: See the new instructions. ?  ?docusate sodium 100 MG capsule ?Commonly known as: Colace ?Take  1 capsule (100 mg total) by mouth 2 (two) times daily. ?  ?HYDROcodone-acetaminophen 5-325 MG tablet ?Commonly known as: NORCO/VICODIN ?TAKE 1 TABLET BY MOUTH EVERY 6 HOURS AS NEEDED. ?  ?levothyroxine 75 MCG tablet ?Commonly known as: SYNTHROID ?TAKE ONE TABLET BY MOUTH ONCE DAILY. ?  ?lisinopril 2.5 MG tablet ?Commonly known as: ZESTRIL ?Take 1 tablet (2.5 mg total) by mouth daily. ?  ?metoprolol succinate 50 MG 24 hr tablet ?Commonly known as: TOPROL-XL ?TAKE 1 TABLET BY MOUTH DAILY. TAKE WITH OR IMMEDIATELY FOLLOWING A MEAL. ?  ?pantoprazole 40 MG tablet ?Commonly known as: PROTONIX ?TAKE 1 TABLET BY MOUTH ONCE DAILY. ?  ?polyethylene glycol powder 17 GM/SCOOP powder ?Commonly known as: GLYCOLAX/MIRALAX ?Take 17 g by mouth daily as needed. ?  ?potassium chloride 10 MEQ tablet ?Commonly known as: KLOR-CON ?Take 1 tablet (10 mEq total) by mouth every other day. ?Start taking on: August 13, 2021 ?What changed:  ?when to take this ?These instructions start on August 13, 2021. If you are unsure what to do until then, ask your doctor or other care provider. ?  ?QC Acetaminophen 8 Hours 650 MG CR tablet ?Generic drug: acetaminophen ?Take 1 tablet by mouth in the morning and at bedtime. ?  ?sertraline 50 MG tablet ?Commonly known as: ZOLOFT ?TAKE 1 TABLET BY MOUTH ONCE A DAY. ?  ?torsemide 20 MG tablet ?Commonly known as: DEMADEX ?Take 1 tablet (20 mg total) by mouth every other day. ?Start taking on: August 13, 2021 ?What changed:  ?See the new instructions. ?These instructions start on August 13, 2021. If you are unsure what to do until then, ask your doctor or other care provider. ?  ?trolamine salicylate 10 % cream ?Commonly known as: ASPERCREME ?Apply 1 application topically as needed (feet and shoulders). ?  ? ?  ? ? Follow-up Information   ? ? [name], MD. Schedule an appointment as soon as possible for a visit in 1 week(s).   ?Specialty: Family Medicine ?Why: Hospital Follow Up ?Contact information: ?[address] ?[address] ?[place] [address] ?[phone] ? ? ?  ?  ? ?  ?  ? ?  ? ?Allergies  ?Allergen Reactions  ? Amiodarone Itching  ? Cephalosporins Itching  ? Meloxicam Itching  ? Robaxin [Methocarbamol

## 2021-08-10 NOTE — Progress Notes (Signed)
[facility] Progress Note ? ?   ?Subjective: ?Pain improved. Having Bms. CT without obstruction, small bowel in hernia but this is reducible on exam.  ? ?Objective: ?Vital signs in last 24 hours: ?Temp:  [97.7 ?F (36.5 ?C)-98.1 ?F (36.7 ?C)] 98.1 ?F (36.7 ?C) (03/21 1239) ?Pulse Rate:  [63-75] 68 (03/21 1239) ?Resp:  [17-20] 20 (03/21 1239) ?BP: (132-177)/(48-93) 140/65 (03/21 1239) ?SpO2:  [94 %-98 %] 98 % (03/21 1239) ?Last BM Date : 08/08/21 ? ?Intake/Output from previous day: ?03/20 0701 - 03/21 0700 ?In: 3582.2 [P.O.:360; I.V.:3222.2] ?Out: -  ?Intake/Output this shift: ?Total I/O ?In: 480 [P.O.:480] ?Out: -  ? ?General appearance: alert and no distress ?GI: soft, nondistended, nontender, hernia reducible ? ?Lab Results:  ?Recent Labs  ?  08/08/21 ?0443 08/09/21 ?0406  ?WBC 11.4* 6.9  ?HGB 10.6* 10.3*  ?HCT 34.6* 32.4*  ?PLT 242 219  ? ?BMET ?Recent Labs  ?  08/09/21 ?0406 08/10/21 ?0532  ?NA 142 143  ?K 3.7 3.8  ?CL 109 112*  ?CO2 24 24  ?GLUCOSE 96 102*  ?BUN 25* 19  ?CREATININE 1.40* 1.17*  ?CALCIUM 8.6* 8.8*  ? ?PT/INR ?No results for input(s): LABPROT, INR in the last 72 hours. ? ?Studies/Results: ?DG Abd 1 View ? ?Result Date: 08/09/2021 ?CLINICAL DATA:  Small-bowel obstruction EXAM: ABDOMEN - 1 VIEW COMPARISON:  08/08/2021 FINDINGS: Persistent dilation of multiple small bowel loops throughout the abdomen, similar in caliber to the previous study. No gross free intraperitoneal air on supine imaging. Atherosclerotic calcifications are present throughout. IMPRESSION: Persistent small bowel obstruction. No significant change from prior. Electronically Signed   By: Davina Poke D.O.   On: 08/09/2021 13:09  ? ?DG Abd 1 View ? ?Result Date: 08/08/2021 ?CLINICAL DATA:  Abdominal pain EXAM: ABDOMEN - 1 VIEW COMPARISON:  Previous studies including the radiographs done earlier today FINDINGS: There is mild dilation of small-bowel loops. Dilated small bowel loops measure up to 3.1 cm in  diameter. There is interval increase in amount of small-bowel gas in the left mid abdomen. Gas and stool are present in the colon. Stomach is not distended. IMPRESSION: There is mild dilation of small-bowel loops. There is interval increase in amount small-bowel gas in the left mid abdomen. Findings suggest partial small bowel obstruction. Electronically Signed   By: Elmer Picker M.D.   On: 08/08/2021 14:16  ? ?CT ABDOMEN PELVIS W CONTRAST ? ?Result Date: 08/09/2021 ?CLINICAL DATA:  Post reduction or hernia, assess for persistent small bowel obstruction. 88 year old patient. EXAM: CT ABDOMEN AND PELVIS WITH CONTRAST TECHNIQUE: Multidetector CT imaging of the abdomen and pelvis was performed using the standard protocol following bolus administration of intravenous contrast. RADIATION DOSE REDUCTION: This exam was performed according to the departmental dose-optimization program which includes automated exposure control, adjustment of the mA and/or kV according to patient size and/or use of iterative reconstruction technique. CONTRAST:  31m OMNIPAQUE IOHEXOL 300 MG/ML  SOLN COMPARISON:  Noncontrast exam 2 days ago 08/07/2021 FINDINGS: Lower chest: Linear atelectasis within both lower lobes. No pleural fluid. Aortic atherosclerosis and coronary artery calcifications. Hepatobiliary: No focal liver abnormality is seen. Gallbladder physiologically distended, no calcified stone. No biliary dilatation. Pancreas: No ductal dilatation or inflammation. Spleen: Normal in size without focal abnormality. Adrenals/Urinary Tract: No adrenal nodule, slight left adrenal thickening. Chronic right renal atrophy. Small left renal cysts. Slight prominence of the left renal pelvis but no hydronephrosis or caliceal dilatation. No renal calculi unremarkable urinary bladder. Stomach/Bowel: Small bowel containing lower abdominal  ventral abdominal wall hernia persists, however the previous small-bowel obstruction appears resolved.  Persistent but decreased small bowel distension, and enteric contrast reaches the ascending colon. There is no small bowel inflammation. Small hiatal hernia. Unremarkable stomach. Large duodenal diverticulum. There is colonic diverticulosis, including the right colon, but no diverticulitis. Moderate formed stool in the colon. There is circumferential distal rectal wall thickening, series 2, image 80. Vascular/Lymphatic: Aortic and branch atherosclerosis. No aortic aneurysm. No portal venous or mesenteric gas. Patent portal and mesenteric veins. No bulky abdominopelvic adenopathy. There is slight prominence of the left gonadal veins Reproductive: Normal for age uterine atrophy. Mildly prominent left greater than right gonadal veins. No reflux of contrast. No adnexal mass. Other: Lower abdominal ventral abdominal wall hernia contains short segment of small bowel, however previous small-bowel obstruction has resolved. No ascites, no free air. Musculoskeletal: Age related degenerative change in the spine. There are no acute or suspicious osseous abnormalities. IMPRESSION: 1. Lower abdominal ventral abdominal wall hernia still contains a short segment of small bowel, however there is no associated bowel obstruction, enteric contrast progresses to the colon. The previous small bowel obstruction has resolved. 2. Colonic diverticulosis without diverticulitis. 3. Chronic right renal atrophy. Aortic Atherosclerosis (ICD10-I70.0). Electronically Signed   By: Keith Rake M.D.   On: 08/09/2021 17:26   ? ?Anti-infectives: ?Anti-infectives (From admission, onward)  ? ? None  ? ?  ? ? ?Assessment/Plan: ?Patient resolved after SBO From hernia. Reduced and is better. Wants to avoid surgery. ?Diet as tolerated ?Dc home ?PCP follow up ? ? LOS: 3 days  ? ? ?[name] ?08/10/2021 ? ?

## 2021-08-10 NOTE — Care Management Important Message (Signed)
Important Message ? ?Patient Details  ?Name: [name] ?MRN: [id number] ?Date of Birth: 1923/11/24 ? ? ?Medicare Important Message Given:  N/A - LOS <3 / Initial given by admissions ? ? ? ? ?[name] ?08/10/2021, 11:53 AM ?

## 2021-08-10 NOTE — Discharge Instructions (Signed)
IMPORTANT INFORMATION: PAY CLOSE ATTENTION   PHYSICIAN DISCHARGE INSTRUCTIONS  Follow with Primary care provider  Luking, Scott A, MD  and other consultants as instructed by your Hospitalist Physician  SEEK MEDICAL CARE OR RETURN TO EMERGENCY ROOM IF SYMPTOMS COME BACK, WORSEN OR NEW PROBLEM DEVELOPS   Please note: You were cared for by a hospitalist during your hospital stay. Every effort will be made to forward records to your primary care provider.  You can request that your primary care provider send for your hospital records if they have not received them.  Once you are discharged, your primary care physician will handle any further medical issues. Please note that NO REFILLS for any discharge medications will be authorized once you are discharged, as it is imperative that you return to your primary care physician (or establish a relationship with a primary care physician if you do not have one) for your post hospital discharge needs so that they can reassess your need for medications and monitor your lab values.  Please get a complete blood count and chemistry panel checked by your Primary MD at your next visit, and again as instructed by your Primary MD.  Get Medicines reviewed and adjusted: Please take all your medications with you for your next visit with your Primary MD  Laboratory/radiological data: Please request your Primary MD to go over all hospital tests and procedure/radiological results at the follow up, please ask your primary care provider to get all Hospital records sent to his/her office.  In some cases, they will be blood work, cultures and biopsy results pending at the time of your discharge. Please request that your primary care provider follow up on these results.  If you are diabetic, please bring your blood sugar readings with you to your follow up appointment with primary care.    Please call and make your follow up appointments as soon as possible.    Also Note  the following: If you experience worsening of your admission symptoms, develop shortness of breath, life threatening emergency, suicidal or homicidal thoughts you must seek medical attention immediately by calling 911 or calling your MD immediately  if symptoms less severe.  You must read complete instructions/literature along with all the possible adverse reactions/side effects for all the Medicines you take and that have been prescribed to you. Take any new Medicines after you have completely understood and accpet all the possible adverse reactions/side effects.   Do not drive when taking Pain medications or sleeping medications (Benzodiazepines)  Do not take more than prescribed Pain, Sleep and Anxiety Medications. It is not advisable to combine anxiety,sleep and pain medications without talking with your primary care practitioner  Special Instructions: If you have smoked or chewed Tobacco  in the last 2 yrs please stop smoking, stop any regular Alcohol  and or any Recreational drug use.  Wear Seat belts while driving.  Do not drive if taking any narcotic, mind altering or controlled substances or recreational drugs or alcohol.       

## 2021-08-10 NOTE — Progress Notes (Signed)
Assisted patient to bathroom earlier in shift, patient had small bowel movement, mainly liquid.  Patient stated he had a small amount of relief from movement.  ?

## 2021-08-10 NOTE — Care Management Important Message (Deleted)
Important Message ? ?Patient Details  ?Name: [name] ?MRN: [id number] ?Date of Birth: Dec 23, 1923 ? ? ?Medicare Important Message Given:  Yes ? ? ? ? ?[name] ?08/10/2021, 11:51 AM ?

## 2021-08-10 NOTE — Progress Notes (Signed)
Pt discharged via WC to POV. 

## 2021-08-11 ENCOUNTER — Telehealth: Payer: Self-pay

## 2021-08-11 ENCOUNTER — Telehealth: Payer: Self-pay | Admitting: Family Medicine

## 2021-08-11 DIAGNOSIS — Z79899 Other long term (current) drug therapy: Secondary | ICD-10-CM

## 2021-08-11 NOTE — Telephone Encounter (Signed)
Transition Care Management Follow-up Telephone Call ?Date of discharge and from where: 08/10/21 [facility] ?How have you been since you were released from the hospital? Pt states she is doing better, but is still weak. ?Any questions or concerns? No ? ?Items Reviewed: ?Did the pt receive and understand the discharge instructions provided? Yes  ?Medications obtained and verified? No  ?Other? No  ?Any new allergies since your discharge? No  ?Dietary orders reviewed? Yes ?Do you have support at home? No  ? ?Home Care and Equipment/Supplies: ?Were home health services ordered? no ?If so, what is the name of the agency? N/A  ?Has the agency set up a time to come to the patient's home? not applicable ?Were any new equipment or medical supplies ordered?  No ?What is the name of the medical supply agency? N/A ?Were you able to get the supplies/equipment? not applicable ?Do you have any questions related to the use of the equipment or supplies? No ? ?Functional Questionnaire: (I = Independent and D = Dependent) ?ADLs: D ? ?Bathing/Dressing- I ? ?Meal Prep- D ? ?Eating- I ? ?Maintaining continence- I ? ?Transferring/Ambulation- I-USES WALKER ? ?Managing Meds- D-NEEDS ASSISTANCE ASAP ?SPOKE WITH PT, SON IS UNABLE TO HELP HER DUE TO HIS HEALTH ISSUES. PT STATES SHE CALLED [facility], HH SHE USED BEFORE AND THEY DO NOT PROVIDE [facility] ANY LONGER. MJP,LPN ?Follow up appointments reviewed: ? ?PCP Hospital f/u appt confirmed? Yes  Scheduled to see Dr. Wolfgang Phoenix on 08/18/21 @ 11:15 ?[facility] f/u appt confirmed?  N/A   ?Are transportation arrangements needed? No  ?If their condition worsens, is the pt aware to call PCP or go to the Emergency Dept.? Yes ?Was the patient provided with contact information for the PCP's office or ED? Yes ?Was to pt encouraged to call back with questions or concerns? Yes ? ?

## 2021-08-11 NOTE — Telephone Encounter (Signed)
Pt called in and states that her med was changed and they just brought out the bubble packs from the pharmacy. Pt is not sure what she is suppose to be taking now. Pt is wanting to know if someone could come out to her home to help her with this. Pt has hospital follow up scheduled for next Wednesday. Please advise. Thank you ?

## 2021-08-12 NOTE — Telephone Encounter (Signed)
Patient stated she is just going to take what in the new bubble for today- doing fine with that- doesn't understand all the writing on the packages. Patient state her caregiver coming in the morning to look at for hr and is going to call the pharmacy if any problems. ?Referral to home health ordered in [place]. Patient notified. ?

## 2021-08-12 NOTE — Telephone Encounter (Signed)
All I can recommend is have the patient read to you what they put in the bubble packs ?Then you could look over this if there is any questions or issues for them as a phone message and I will try to look back at a future time but it may not necessarily be later this afternoon it may end up being tonight or first thing in the morning thank you ? ?Also you can do a home health consultation just but it is very unlikely they would be able to send someone to her home today to look at this. ?

## 2021-08-12 NOTE — Addendum Note (Signed)
Addended by: Dairl Ponder on: 08/12/2021 10:50 AM ? ? Modules accepted: Orders ? ?

## 2021-08-18 ENCOUNTER — Ambulatory Visit (INDEPENDENT_AMBULATORY_CARE_PROVIDER_SITE_OTHER): Payer: Medicare Other | Admitting: Family Medicine

## 2021-08-18 VITALS — BP 140/73 | HR 71 | Temp 97.7°F

## 2021-08-18 DIAGNOSIS — R112 Nausea with vomiting, unspecified: Secondary | ICD-10-CM | POA: Diagnosis not present

## 2021-08-18 DIAGNOSIS — M25551 Pain in right hip: Secondary | ICD-10-CM | POA: Diagnosis not present

## 2021-08-18 NOTE — Progress Notes (Signed)
? ?  Subjective:  ? ? Patient ID: [name], female    DOB: Nov 26, 1923, 88 y.o.   MRN: [id number] ? ?HPI ? ?Patient being seen for hospital follow for small bowel obstruction.  Patient was hospitalized at [facility] ?Patient was recently in the hospital versus partial small bowel obstruction she has had this several times.  It is resolved on its own in the past and it this time as well.  Very frustrating for the patient.  She has had previous hernia surgery.  More than likely has small bowel adhesions.  Did state earlier this week she had abdominal pain and vomiting x1 but states she is doing fairly well now ? ?She also relates low back pain more in the right lower back.  Hurts with certain movements.  Also hip pain on the right side. ? ?Patient also has atrial fibrillation she cannot be on Eliquis because of history of intracerebral hemorrhage as well as increased risk of falls she is currently on 81 mg aspirin but the past several times her heart rate has been in rhythm in it raises into question if that 81 mg is worth the risk given her age ?Review of Systems ? ?   ?Objective:  ? Physical Exam ? ?Lungs are clear hearts regular pulse normal extremities no edema skin warm dry low back significant moderate tenderness ? ?Abdomen is soft no guarding or rebound or tenderness ?   ?Assessment & Plan:  ?1. Nausea and vomiting, unspecified vomiting type ?She is at risk for another partial small bowel obstruction and the warning signs were discussed I do not feel she is having that currently overall ?She is hanging in there there is an increased risk of full obstruction if that would occur she would be a high risk patient for surgery ?2. Right hip pain ?Has ongoing arthritis in her hip as well as low back pain uses hydrocodone sparingly refill given ? ?History of insomnia but we will not use Xanax because increased risk of falls ?Transitional care from the hospital ?History of osteopenia will do up-to-date bone  density ? ?Also stopped 81 mg aspirin because of increased risk of GI bleed as well as other potential bleeding.  Her heart rate is in rhythm currently.  The risk of stroke is low the risk of bleed is high ? ?Follow-up 6 to 8 weeks ? ? ? ?

## 2021-08-18 NOTE — Patient Instructions (Signed)
Stop 81 mg aspirin ?Follow-up in 6 weeks ?

## 2021-08-23 DIAGNOSIS — I480 Paroxysmal atrial fibrillation: Secondary | ICD-10-CM | POA: Diagnosis not present

## 2021-08-23 DIAGNOSIS — E785 Hyperlipidemia, unspecified: Secondary | ICD-10-CM | POA: Diagnosis not present

## 2021-08-23 DIAGNOSIS — F419 Anxiety disorder, unspecified: Secondary | ICD-10-CM | POA: Diagnosis not present

## 2021-08-23 DIAGNOSIS — I700 Atherosclerosis of aorta: Secondary | ICD-10-CM | POA: Diagnosis not present

## 2021-08-23 DIAGNOSIS — E039 Hypothyroidism, unspecified: Secondary | ICD-10-CM | POA: Diagnosis not present

## 2021-08-23 DIAGNOSIS — N1832 Chronic kidney disease, stage 3b: Secondary | ICD-10-CM | POA: Diagnosis not present

## 2021-08-23 DIAGNOSIS — M810 Age-related osteoporosis without current pathological fracture: Secondary | ICD-10-CM | POA: Diagnosis not present

## 2021-08-23 DIAGNOSIS — J479 Bronchiectasis, uncomplicated: Secondary | ICD-10-CM | POA: Diagnosis not present

## 2021-08-23 DIAGNOSIS — M10372 Gout due to renal impairment, left ankle and foot: Secondary | ICD-10-CM | POA: Diagnosis not present

## 2021-08-23 DIAGNOSIS — G4700 Insomnia, unspecified: Secondary | ICD-10-CM | POA: Diagnosis not present

## 2021-08-23 DIAGNOSIS — I129 Hypertensive chronic kidney disease with stage 1 through stage 4 chronic kidney disease, or unspecified chronic kidney disease: Secondary | ICD-10-CM | POA: Diagnosis not present

## 2021-08-23 DIAGNOSIS — K56699 Other intestinal obstruction unspecified as to partial versus complete obstruction: Secondary | ICD-10-CM | POA: Diagnosis not present

## 2021-08-23 DIAGNOSIS — M549 Dorsalgia, unspecified: Secondary | ICD-10-CM | POA: Diagnosis not present

## 2021-08-23 DIAGNOSIS — N179 Acute kidney failure, unspecified: Secondary | ICD-10-CM | POA: Diagnosis not present

## 2021-08-23 DIAGNOSIS — I4892 Unspecified atrial flutter: Secondary | ICD-10-CM | POA: Diagnosis not present

## 2021-08-23 DIAGNOSIS — K439 Ventral hernia without obstruction or gangrene: Secondary | ICD-10-CM | POA: Diagnosis not present

## 2021-08-27 ENCOUNTER — Telehealth: Payer: Medicare Other

## 2021-08-30 DIAGNOSIS — I129 Hypertensive chronic kidney disease with stage 1 through stage 4 chronic kidney disease, or unspecified chronic kidney disease: Secondary | ICD-10-CM | POA: Diagnosis not present

## 2021-08-30 DIAGNOSIS — N179 Acute kidney failure, unspecified: Secondary | ICD-10-CM

## 2021-08-30 DIAGNOSIS — K56699 Other intestinal obstruction unspecified as to partial versus complete obstruction: Secondary | ICD-10-CM | POA: Diagnosis not present

## 2021-08-30 DIAGNOSIS — I4892 Unspecified atrial flutter: Secondary | ICD-10-CM

## 2021-08-30 DIAGNOSIS — N1832 Chronic kidney disease, stage 3b: Secondary | ICD-10-CM | POA: Diagnosis not present

## 2021-08-30 DIAGNOSIS — M10372 Gout due to renal impairment, left ankle and foot: Secondary | ICD-10-CM | POA: Diagnosis not present

## 2021-09-01 ENCOUNTER — Ambulatory Visit (INDEPENDENT_AMBULATORY_CARE_PROVIDER_SITE_OTHER): Payer: Medicare Other | Admitting: *Deleted

## 2021-09-01 DIAGNOSIS — I4811 Longstanding persistent atrial fibrillation: Secondary | ICD-10-CM

## 2021-09-01 DIAGNOSIS — E785 Hyperlipidemia, unspecified: Secondary | ICD-10-CM

## 2021-09-01 NOTE — Chronic Care Management (AMB) (Signed)
?Chronic Care Management  ? ?CCM RN Visit Note ? ?09/01/2021 ?Name: [name] MRN: [id number] DOB: [birth year] ? ?Subjective: ?[name] is a 88 y.o. year old female who is a primary care patient of Luking, Elayne Snare, MD. The care management team was consulted for assistance with disease management and care coordination needs.   ? ?Engaged with patient by telephone for initial visit in response to provider referral for case management and/or care coordination services.  ? ?Consent to Services:  ?The patient was given the following information about Chronic Care Management services today, agreed to services, and gave verbal consent: 1. CCM service includes personalized support from designated clinical staff supervised by the primary care provider, including individualized plan of care and coordination with other care providers 2. 24/7 contact phone numbers for assistance for urgent and routine care needs. 3. Service will only be billed when office clinical staff spend 20 minutes or more in a month to coordinate care. 4. Only one practitioner may furnish and bill the service in a calendar month. 5.The patient may stop CCM services at any time (effective at the end of the month) by phone call to the office staff. 6. The patient will be responsible for cost sharing (co-pay) of up to 20% of the service fee (after annual deductible is met). Patient agreed to services and consent obtained. ? ?Patient agreed to services and verbal consent obtained.  ? ?Assessment: Review of patient past medical history, allergies, medications, health status, including review of consultants reports, laboratory and other test data, was performed as part of comprehensive evaluation and provision of chronic care management services.  ? ?SDOH (Social Determinants of Health) assessments and interventions performed:  ?SDOH Interventions   ? ?Flowsheet Row Most Recent Value  ?SDOH Interventions   ?Food Insecurity Interventions Intervention  Not Indicated  ?Transportation Interventions Intervention Not Indicated  ?Depression Interventions/Treatment  Currently on Treatment  ? ?  ?  ? ?[place] ? ?Allergies  ?Allergen Reactions  ? Amiodarone Itching  ? Cephalosporins Itching  ? Meloxicam Itching  ? Robaxin [Methocarbamol] Itching  ? Amoxicillin Hives  ? Lovenox [Enoxaparin Sodium] Nausea And Vomiting  ? Morphine   ? Morphine And Related Itching  ? Penicillins Swelling  ?   ?  ? Sulfa Antibiotics Swelling  ? Lipitor [Atorvastatin] Other (See Comments)  ?  Unknown:patient is not aware of this allergy  ? Lorazepam Other (See Comments)  ?  Exceptional fatigue ?  ? Macrobid [Nitrofurantoin] Other (See Comments)  ?  Constipation  ? ? ?Outpatient Encounter Medications as of 09/01/2021  ?Medication Sig Note  ? allopurinol (ZYLOPRIM) 100 MG tablet Take 0.5 tablets (50 mg total) by mouth daily.   ? carbamide peroxide (DEBROX) 6.5 % OTIC solution Place 5 drops into both ears 2 (two) times daily as needed (as needed for ear wax build up).   ? diltiazem (CARDIZEM CD) 120 MG 24 hr capsule TAKE (1) CAPSULE BY MOUTH ONCE DAILY.   ? docusate sodium (COLACE) 100 MG capsule Take 1 capsule (100 mg total) by mouth 2 (two) times daily.   ? HYDROcodone-acetaminophen (NORCO/VICODIN) 5-325 MG tablet TAKE 1 TABLET BY MOUTH EVERY 6 HOURS AS NEEDED.   ? levothyroxine (SYNTHROID) 75 MCG tablet TAKE ONE TABLET BY MOUTH ONCE DAILY.   ? lisinopril (ZESTRIL) 2.5 MG tablet Take 1 tablet (2.5 mg total) by mouth daily.   ? metoprolol succinate (TOPROL-XL) 50 MG 24 hr tablet TAKE 1 TABLET BY MOUTH DAILY. TAKE  WITH OR IMMEDIATELY FOLLOWING A MEAL.   ? pantoprazole (PROTONIX) 40 MG tablet TAKE 1 TABLET BY MOUTH ONCE DAILY.   ? polyethylene glycol powder (GLYCOLAX/MIRALAX) 17 GM/SCOOP powder Take 17 g by mouth daily as needed. 07/22/2021: prn  ? potassium chloride (KLOR-CON) 10 MEQ tablet Take 1 tablet (10 mEq total) by mouth every other day.   ? QC ACETAMINOPHEN 8 HOURS 650 MG CR tablet Take  1 tablet by mouth in the morning and at bedtime.   ? sertraline (ZOLOFT) 50 MG tablet TAKE 1 TABLET BY MOUTH ONCE A DAY.   ? torsemide (DEMADEX) 20 MG tablet Take 1 tablet (20 mg total) by mouth every other day.   ? trolamine salicylate (ASPERCREME) 10 % cream Apply 1 application topically as needed (feet and shoulders).   ? vitamin B-12 1000 MCG tablet Take 1 tablet (1,000 mcg total) by mouth daily.   ? ?No facility-administered encounter medications on file as of 09/01/2021.  ? ? ?Patient Active Problem List  ? Diagnosis Date Noted  ? Acute gout due to renal impairment involving foot 08/09/2021  ? AKI (acute kidney injury) on stage 3b CKD  08/08/2021  ? SBO (small bowel obstruction) ([place]) 11/07/2019  ? Nausea and vomiting 11/07/2019  ? Hyperglycemia 11/07/2019  ? Right hip pain 08/01/2019  ? GI bleed 02/16/2019  ? HLD (hyperlipidemia) 07/18/2017  ? B12 deficiency 07/18/2017  ? SAH (subarachnoid hemorrhage) ([place]) 07/17/2017  ? ICH (intracerebral hemorrhage) ([place]) 07/16/2017  ? Cervical disc disease 05/09/2017  ? Hypothyroid 05/09/2017  ? Atrial fibrillation ([place]) 01/12/2016  ? Osteoarthritis of right knee 12/15/2015  ? Major depression 09/22/2015  ? Ventral hernia 05/21/2015  ? Ovarian tumor 04/13/2015  ? Squamous cell skin cancer 08/13/2014  ? Impaired fasting glucose 08/05/2013  ? Neuropathy 01/11/2013  ? Anxiety 01/29/2012  ? GERD (gastroesophageal reflux disease) 01/29/2012  ? Sciatica of right side 05/10/2011  ? ? ?Conditions to be addressed/monitored:Atrial Fibrillation and HLD ? ?Care Plan : RN Care Manager Plan of Care  ?Updates made by Kassie Mends, RN since 09/01/2021 12:00 AM  ?  ? ?Problem: No plan of care established for management of chronic disease state  (Atrial fibrillation, HLD)   ?Priority: High  ?  ? ?Long-Range Goal: Development of plan of care for chronic disease management  (Atrial fibrillation, HLD)   ?Start Date: 09/01/2021  ?Expected End Date: 02/28/2022  ?Priority: High  ?Note:   ?Current  Barriers:  ?Knowledge Deficits related to plan of care for management of Atrial Fibrillation and HLD  ?Spoke with patient who reports she lives alone, her adult son checks in on her frequently, has hired caregiver to assist with grocery shopping, washing clothes etc every Wednesday, pt has walker that she uses at all times, has Life Alert necklace, has had one fall this past year, pt is unable to exercise due to chronic pain but does walk through her house as much as she can. Patient reports she has all medications and takes as prescribed. ? ?RNCM Clinical Goal(s):  ?Patient will verbalize understanding of plan for management of Atrial Fibrillation and HLD as evidenced by patient report, review of EHR and  through collaboration with RN Care manager, provider, and care team.  ? ?Interventions: ?1:1 collaboration with primary care provider regarding development and update of comprehensive plan of care as evidenced by provider attestation and co-signature ?Inter-disciplinary care team collaboration (see longitudinal plan of care) ?Evaluation of current treatment plan related to  self management and patient's  adherence to plan as established by provider ? ? ?AFIB Interventions: (Status:  New goal. and Goal on track:  Yes.) Long Term Goal ?  Counseled on importance of regular laboratory monitoring as prescribed ?Afib action plan reviewed ?Reviewed importance of knowing how you feel every day and calling doctor early on for change in health status, symptoms  ?Education provided Atrial fibrillation ? ?Hyperlipidemia:  (Status: New goal. Goal on Track (progressing): YES.) Long Term Goal  ?Lab Results  ?Component Value Date  ? CHOL 185 07/18/2017  ? HDL 72 07/18/2017  ? [place] 85 07/18/2017  ? TRIG 139 07/18/2017  ? CHOLHDL 2.6 07/18/2017  ?Medication review performed; medication list updated in electronic medical record.  ?Provider established cholesterol goals reviewed; ?Counseled on importance of regular laboratory  monitoring as prescribed; ?Provided HLD educational materials; ?Reviewed importance of limiting foods high in cholesterol; ?Screening for signs and symptoms of depression related to chronic disease state;

## 2021-09-01 NOTE — Patient Instructions (Signed)
Visit Information  ? ?Thank you for taking time to visit with me today. Please don't hesitate to contact me if I can be of assistance to you before our next scheduled telephone appointment. ? ?Following are the goals we discussed today:  ?- begin a symptom diary ?- check pulse (heart) rate before taking medicine ?- make a plan to eat healthy ?- keep all lab appointments ?- take medicine as prescribed ?- call for medicine refill 2 or 3 days before it runs out ?- take all medications exactly as prescribed ?- call doctor with any symptoms you believe are related to your medicine ?- call doctor when you experience any new symptoms ?- go to all doctor appointments as scheduled ?- adhere to prescribed diet: heart healthy ?Bake or broil foods instead of frying ?Avoid trans/ saturated fats ?fall prevention strategies: change position slowly, use assistive device such as walker or cane (per provider recommendations) when walking, keep walkways clear, have good lighting in room. It is important to contact your provider if you have any falls, maintain muscle strength/tone by exercise per provider recommendations. ? ?Heart-Healthy Eating Plan ?Heart-healthy meal planning includes: ?Eating less unhealthy fats. ?Eating more healthy fats. ?Making other changes in your diet. ?Talk with your doctor or a diet specialist (dietitian) to create an eating plan that is right for you. ?What is my plan? ?Your doctor may recommend an eating plan that includes: ?Total fat: ______% or less of total calories a day. ?Saturated fat: ______% or less of total calories a day. ?Cholesterol: less than _________mg a day. ?What are tips for following this plan? ?Cooking ?Avoid frying your food. Try to bake, boil, grill, or broil it instead. You can also reduce fat by: ?Removing the skin from poultry. ?Removing all visible fats from meats. ?Steaming vegetables in water or broth. ?Meal planning ? ?At meals, divide your plate into four equal parts: ?Fill  one-half of your plate with vegetables and green salads. ?Fill one-fourth of your plate with whole grains. ?Fill one-fourth of your plate with lean protein foods. ?Eat 4-5 servings of vegetables per day. A serving of vegetables is: ?1 cup of raw or cooked vegetables. ?2 cups of raw leafy greens. ?Eat 4-5 servings of fruit per day. A serving of fruit is: ?1 medium whole fruit. ?? cup of dried fruit. ?? cup of fresh, frozen, or canned fruit. ?? cup of 100% fruit juice. ?Eat more foods that have soluble fiber. These are apples, broccoli, carrots, beans, peas, and barley. Try to get 20-30 g of fiber per day. ?Eat 4-5 servings of nuts, legumes, and seeds per week: ?1 serving of dried beans or legumes equals ? cup after being cooked. ?1 serving of nuts is ? cup. ?1 serving of seeds equals 1 tablespoon. ?General information ?Eat more home-cooked food. Eat less restaurant, buffet, and fast food. ?Limit or avoid alcohol. ?Limit foods that are high in starch and sugar. ?Avoid fried foods. ?Lose weight if you are overweight. ?Keep track of how much salt (sodium) you eat. This is important if you have high blood pressure. Ask your doctor to tell you more about this. ?Try to add vegetarian meals each week. ?Fats ?Choose healthy fats. These include olive oil and canola oil, flaxseeds, walnuts, almonds, and seeds. ?Eat more omega-3 fats. These include salmon, mackerel, sardines, tuna, flaxseed oil, and ground flaxseeds. Try to eat fish at least 2 times each week. ?Check food labels. Avoid foods with trans fats or high amounts of saturated fat. ?Limit saturated  fats. ?These are often found in animal products, such as meats, butter, and cream. ?These are also found in plant foods, such as palm oil, palm kernel oil, and coconut oil. ?Avoid foods with partially hydrogenated oils in them. These have trans fats. Examples are stick margarine, some tub margarines, cookies, crackers, and other baked goods. ?What foods can I  eat? ?Fruits ?All fresh, canned (in natural juice), or frozen fruits. ?Vegetables ?Fresh or frozen vegetables (raw, steamed, roasted, or grilled). Green salads. ?Grains ?Most grains. Choose whole wheat and whole grains most of the time. Rice and pasta, including brown rice and pastas made with whole wheat. ?Meats and other proteins ?Lean, well-trimmed beef, veal, pork, and lamb. Chicken and [place] without skin. All fish and shellfish. Wild duck, rabbit, pheasant, and venison. Egg whites or low-cholesterol egg substitutes. Dried beans, peas, lentils, and tofu. Seeds and most nuts. ?Dairy ?Low-fat or nonfat cheeses, including ricotta and mozzarella. Skim or 1% milk that is liquid, powdered, or evaporated. Buttermilk that is made with low-fat milk. Nonfat or low-fat yogurt. ?Fats and oils ?Non-hydrogenated (trans-free) margarines. Vegetable oils, including soybean, sesame, sunflower, olive, peanut, safflower, corn, canola, and cottonseed. Salad dressings or mayonnaise made with a vegetable oil. ?Beverages ?Mineral water. Coffee and tea. Diet carbonated beverages. ?Sweets and desserts ?Sherbet, gelatin, and fruit ice. Small amounts of dark chocolate. ?Limit all sweets and desserts. ?Seasonings and condiments ?All seasonings and condiments. ?The items listed above may not be a complete list of foods and drinks you can eat. Contact a dietitian for more options. ?What foods should I avoid? ?Fruits ?Canned fruit in heavy syrup. Fruit in cream or butter sauce. Fried fruit. Limit coconut. ?Vegetables ?Vegetables cooked in cheese, cream, or butter sauce. Fried vegetables. ?Grains ?Breads that are made with saturated or trans fats, oils, or whole milk. Croissants. Sweet rolls. Donuts. High-fat crackers, such as cheese crackers. ?Meats and other proteins ?Fatty meats, such as hot dogs, ribs, sausage, bacon, rib-eye roast or steak. High-fat deli meats, such as salami and bologna. Caviar. Domestic duck and goose. Organ meats, such  as liver. ?Dairy ?Cream, sour cream, cream cheese, and creamed cottage cheese. Whole-milk cheeses. Whole or 2% milk that is liquid, evaporated, or condensed. Whole buttermilk. Cream sauce or high-fat cheese sauce. Yogurt that is made from whole milk. ?Fats and oils ?Meat fat, or shortening. Cocoa butter, hydrogenated oils, palm oil, coconut oil, palm kernel oil. Solid fats and shortenings, including bacon fat, salt pork, lard, and butter. Nondairy cream substitutes. Salad dressings with cheese or sour cream. ?Beverages ?Regular sodas and juice drinks with added sugar. ?Sweets and desserts ?Frosting. Pudding. Cookies. Cakes. Pies. Milk chocolate or white chocolate. Buttered syrups. Full-fat ice cream or ice cream drinks. ?The items listed above may not be a complete list of foods and drinks to avoid. Contact a dietitian for more information. ?Summary ?Heart-healthy meal planning includes eating less unhealthy fats, eating more healthy fats, and making other changes in your diet. ?Eat a balanced diet. This includes fruits and vegetables, low-fat or nonfat dairy, lean protein, nuts and legumes, whole grains, and heart-healthy oils and fats. ?This information is not intended to replace advice given to you by your health care provider. Make sure you discuss any questions you have with your health care provider. ?Document Revised: 09/17/2020 Document Reviewed: 09/17/2020 ?Elsevier Patient Education ? [place]. ? ?Atrial Fibrillation ?Atrial fibrillation is a type of heartbeat that is irregular or fast. If you have this condition, your heart beats without any  order. This makes it hard for your heart to pump blood in a normal way. ?Atrial fibrillation may come and go, or it may become a long-lasting problem. If this condition is not treated, it can put you at higher risk for stroke, heart failure, and other heart problems. ?What are the causes? ?This condition may be caused by diseases that damage the heart. They  include: ?High blood pressure. ?Heart failure. ?Heart valve disease. ?Heart surgery. ?Other causes include: ?Diabetes. ?Thyroid disease. ?Being overweight. ?Kidney disease. ?Sometimes the cause is not known. ?What in

## 2021-09-02 ENCOUNTER — Other Ambulatory Visit: Payer: Self-pay

## 2021-09-02 MED ORDER — LISINOPRIL 2.5 MG PO TABS: 2.5000 mg | ORAL_TABLET | Freq: Every day | ORAL | 0 refills | Status: DC

## 2021-09-06 ENCOUNTER — Other Ambulatory Visit: Payer: Self-pay | Admitting: Family Medicine

## 2021-09-13 DIAGNOSIS — L609 Nail disorder, unspecified: Secondary | ICD-10-CM | POA: Diagnosis not present

## 2021-09-13 DIAGNOSIS — I739 Peripheral vascular disease, unspecified: Secondary | ICD-10-CM | POA: Diagnosis not present

## 2021-09-13 DIAGNOSIS — L110 Acquired keratosis follicularis: Secondary | ICD-10-CM | POA: Diagnosis not present

## 2021-09-13 DIAGNOSIS — M79671 Pain in right foot: Secondary | ICD-10-CM | POA: Diagnosis not present

## 2021-09-19 DIAGNOSIS — E785 Hyperlipidemia, unspecified: Secondary | ICD-10-CM | POA: Diagnosis not present

## 2021-09-19 DIAGNOSIS — I4811 Longstanding persistent atrial fibrillation: Secondary | ICD-10-CM

## 2021-09-20 ENCOUNTER — Telehealth: Payer: Self-pay | Admitting: Family Medicine

## 2021-09-20 ENCOUNTER — Other Ambulatory Visit: Payer: Self-pay | Admitting: Family Medicine

## 2021-09-20 NOTE — Telephone Encounter (Signed)
Patient advised per Dr Nicki Reaper: 1.  In regards to aspirin Dr Nicki Reaper would still recommend not to be on aspirin.  Studies show the risk of bleeding far outweighs any benefit.  Even though she has not had any bleeding recently studies show that older individuals who are on aspirin have a much higher risk of having a serious bleed versus any benefit from the medicine ?#2 as for the constipation please find out how often is she taking the MiraLAX 1 capful daily?  MiraLAX is nothing more than a stool softener.  It is quite possible she will need to take a larger amount in order to get response ?She can also take a stool stimulant such as Senokot but to use this only once or twice a week if bowel movements have not been occurring ?It is also important to know the definition she uses of constipation.  Hard bowel movements?  Or more of difficulty passing a bowel movement?  Any blood in the stool? ?If ongoing troubles follow-up ? ?Patient verbalized understanding and stated she takes one capful of the Miralax daily and her stools are never hard they are always runny but not everyday and she does not have blood in her stool ?

## 2021-09-20 NOTE — Telephone Encounter (Signed)
1.  In regards to aspirin I would still recommend not to be on aspirin.  Studies show the risk of bleeding far outweighs any benefit.  Even though she has not had any bleeding recently studies show that older individuals who are on aspirin have a much higher risk of having a serious bleed versus any benefit from the medicine ?#2 as for the constipation please find out how often is she taking the MiraLAX 1 capful daily?  MiraLAX is nothing more than a stool softener.  It is quite possible she will need to take a larger amount in order to get response ?She can also take a stool stimulant such as Senokot but to use this only once or twice a week if bowel movements have not been occurring ?It is also important to know the definition she uses of constipation.  Hard bowel movements?  Or more of difficulty passing a bowel movement?  Any blood in the stool? ?If ongoing troubles follow-up obviously otherwise provide me with further answers and I can provide further guidance thank you ?

## 2021-09-20 NOTE — Telephone Encounter (Signed)
Pt calling to see why PCP stopped Asprin. Per march office note; aspirin stopped due to increased risk of GI bleed. Pt states she has not had any issues with bleeding while on aspirin. Pt wanting to know what to take in place of aspirin. Also states that Miralax is not helping constipation, wanting to know what else she can use. Please advise. Thank you ?

## 2021-09-20 NOTE — Telephone Encounter (Signed)
So it is very difficult to really judge the MiraLAX based upon what she is stating it sounds like it seems to be working relatively well if her bowel movements are runny with that she may want to cut back on the dose instead of a full cap per day may be a half cap per day would still give results without having runny stools ? ?Follow-up as needed and for regular visits she has an appointment on May 10 with me ?

## 2021-09-20 NOTE — Telephone Encounter (Signed)
Telephone call no answer 

## 2021-09-21 ENCOUNTER — Ambulatory Visit (INDEPENDENT_AMBULATORY_CARE_PROVIDER_SITE_OTHER): Payer: Medicare Other

## 2021-09-21 VITALS — Ht 63.0 in | Wt 146.0 lb

## 2021-09-21 DIAGNOSIS — Z0000 Encounter for general adult medical examination without abnormal findings: Secondary | ICD-10-CM

## 2021-09-21 NOTE — Patient Instructions (Addendum)
[name] , ?Thank you for taking time to come for your Medicare Wellness Visit. I appreciate your ongoing commitment to your health goals. Please review the following plan we discussed and let me know if I can assist you in the future.  ? ?Screening recommendations/referrals: ?Colonoscopy: No longer required due to age.  ?Mammogram: No longer required.  ?Bone Density: Done 04/01/2009 Repeat no longer required. ? ?Recommended yearly ophthalmology/optometry visit for glaucoma screening and checkup ?Recommended yearly dental visit for hygiene and checkup ? ?Vaccinations: ?Influenza vaccine: Done 02/15/2021 Repeat annually ? ?Pneumococcal vaccine: Done 03/21/2004 and 12/27/2013 ?Tdap vaccine: Due Repeat in 10 years ? ?Shingles vaccine: Declined.   ?Covid-19:Done 07/03/2019 and 07/31/2019 ? ?Advanced directives: Please bring a copy of your health care power of attorney and living will to the office to be added to your chart at your convenience. ? ? ?Conditions/risks identified: Aim for 30 minutes of exercise or brisk walking, 6-8 glasses of water, and 5 servings of fruits and vegetables each day. ? ? ?Next appointment: Follow up in one year for your annual wellness visit 09/27/2022 @ 10:30 AM.  ? ? ?Preventive Care 84 Years and Older, Female ?Preventive care refers to lifestyle choices and visits with your health care provider that can promote health and wellness. ?What does preventive care include? ?A yearly physical exam. This is also called an annual well check. ?Dental exams once or twice a year. ?Routine eye exams. Ask your health care provider how often you should have your eyes checked. ?Personal lifestyle choices, including: ?Daily care of your teeth and gums. ?Regular physical activity. ?Eating a healthy diet. ?Avoiding tobacco and drug use. ?Limiting alcohol use. ?Practicing safe sex. ?Taking low-dose aspirin every day. ?Taking vitamin and mineral supplements as recommended by your health care provider. ?What  happens during an annual well check? ?The services and screenings done by your health care provider during your annual well check will depend on your age, overall health, lifestyle risk factors, and family history of disease. ?Counseling  ?Your health care provider may ask you questions about your: ?Alcohol use. ?Tobacco use. ?Drug use. ?Emotional well-being. ?Home and relationship well-being. ?Sexual activity. ?Eating habits. ?History of falls. ?Memory and ability to understand (cognition). ?Work and work Statistician. ?Reproductive health. ?Screening  ?You may have the following tests or measurements: ?Height, weight, and BMI. ?Blood pressure. ?Lipid and cholesterol levels. These may be checked every 5 years, or more frequently if you are over 75 years old. ?Skin check. ?Lung cancer screening. You may have this screening every year starting at age 40 if you have a 30-pack-year history of smoking and currently smoke or have quit within the past 15 years. ?Fecal occult blood test (FOBT) of the stool. You may have this test every year starting at age 101. ?Flexible sigmoidoscopy or colonoscopy. You may have a sigmoidoscopy every 5 years or a colonoscopy every 10 years starting at age 36. ?Hepatitis C blood test. ?Hepatitis B blood test. ?Sexually transmitted disease (STD) testing. ?Diabetes screening. This is done by checking your blood sugar (glucose) after you have not eaten for a while (fasting). You may have this done every 1-3 years. ?Bone density scan. This is done to screen for osteoporosis. You may have this done starting at age 4. ?Mammogram. This may be done every 1-2 years. Talk to your health care provider about how often you should have regular mammograms. ?Talk with your health care provider about your test results, treatment options, and if necessary, the need for  more tests. ?Vaccines  ?Your health care provider may recommend certain vaccines, such as: ?Influenza vaccine. This is recommended every  year. ?Tetanus, diphtheria, and acellular pertussis (Tdap, Td) vaccine. You may need a Td booster every 10 years. ?Zoster vaccine. You may need this after age 50. ?Pneumococcal 13-valent conjugate (PCV13) vaccine. One dose is recommended after age 72. ?Pneumococcal polysaccharide (PPSV23) vaccine. One dose is recommended after age 26. ?Talk to your health care provider about which screenings and vaccines you need and how often you need them. ?This information is not intended to replace advice given to you by your health care provider. Make sure you discuss any questions you have with your health care provider. ?Document Released: 06/05/2015 Document Revised: 01/27/2016 Document Reviewed: 03/10/2015 ?Elsevier Interactive Patient Education ? 2017 [place]. ? ?Fall Prevention in the Home ?Falls can cause injuries. They can happen to people of all ages. There are many things you can do to make your home safe and to help prevent falls. ?What can I do on the outside of my home? ?Regularly fix the edges of walkways and driveways and fix any cracks. ?Remove anything that might make you trip as you walk through a door, such as a raised step or threshold. ?Trim any bushes or trees on the path to your home. ?Use bright outdoor lighting. ?Clear any walking paths of anything that might make someone trip, such as rocks or tools. ?Regularly check to see if handrails are loose or broken. Make sure that both sides of any steps have handrails. ?Any raised decks and porches should have guardrails on the edges. ?Have any leaves, snow, or ice cleared regularly. ?Use sand or salt on walking paths during winter. ?Clean up any spills in your garage right away. This includes oil or grease spills. ?What can I do in the bathroom? ?Use night lights. ?Install grab bars by the toilet and in the tub and shower. Do not use towel bars as grab bars. ?Use non-skid mats or decals in the tub or shower. ?If you need to sit down in the shower, use a  plastic, non-slip stool. ?Keep the floor dry. Clean up any water that spills on the floor as soon as it happens. ?Remove soap buildup in the tub or shower regularly. ?Attach bath mats securely with double-sided non-slip rug tape. ?Do not have throw rugs and other things on the floor that can make you trip. ?What can I do in the bedroom? ?Use night lights. ?Make sure that you have a light by your bed that is easy to reach. ?Do not use any sheets or blankets that are too big for your bed. They should not hang down onto the floor. ?Have a firm chair that has side arms. You can use this for support while you get dressed. ?Do not have throw rugs and other things on the floor that can make you trip. ?What can I do in the kitchen? ?Clean up any spills right away. ?Avoid walking on wet floors. ?Keep items that you use a lot in easy-to-reach places. ?If you need to reach something above you, use a strong step stool that has a grab bar. ?Keep electrical cords out of the way. ?Do not use floor polish or wax that makes floors slippery. If you must use wax, use non-skid floor wax. ?Do not have throw rugs and other things on the floor that can make you trip. ?What can I do with my stairs? ?Do not leave any items on the stairs. ?Make  sure that there are handrails on both sides of the stairs and use them. Fix handrails that are broken or loose. Make sure that handrails are as long as the stairways. ?Check any carpeting to make sure that it is firmly attached to the stairs. Fix any carpet that is loose or worn. ?Avoid having throw rugs at the top or bottom of the stairs. If you do have throw rugs, attach them to the floor with carpet tape. ?Make sure that you have a light switch at the top of the stairs and the bottom of the stairs. If you do not have them, ask someone to add them for you. ?What else can I do to help prevent falls? ?Wear shoes that: ?Do not have high heels. ?Have rubber bottoms. ?Are comfortable and fit you  well. ?Are closed at the toe. Do not wear sandals. ?If you use a stepladder: ?Make sure that it is fully opened. Do not climb a closed stepladder. ?Make sure that both sides of the stepladder are locked into place. ?A

## 2021-09-21 NOTE — Progress Notes (Signed)
? ?Subjective:  ? [name] is a 88 y.o. female who presents for Medicare Annual (Subsequent) preventive examination. ?Virtual Visit via Telephone Note ? ?I connected with  [name] on 09/21/21 at 10:30 AM EDT by telephone and verified that I am speaking with the correct person using two identifiers. ? ?Location: ?Patient: HOME ?Provider: [facility] ?Persons participating in the virtual visit: patient/Nurse Health Advisor ?  ?I discussed the limitations, risks, security and privacy concerns of performing an evaluation and management service by telephone and the availability of in person appointments. The patient expressed understanding and agreed to proceed. ? ?Interactive audio and video telecommunications were attempted between this nurse and patient, however failed, due to patient having technical difficulties OR patient did not have access to video capability.  We continued and completed visit with audio only. ? ?Some vital signs may be absent or patient reported.  ? ?Chriss Driver, LPN ? ?Review of Systems    ? ?Cardiac Risk Factors include: advanced age (>28mn, >>81women);hypertension;dyslipidemia;sedentary lifestyle ? ?   ?Objective:  ?  ?Today's Vitals  ? 09/21/21 1029 09/21/21 1030  ?Weight: 146 lb (66.2 kg)   ?Height: '5\' 3"'$  (1.6 m)   ?PainSc:  4   ? ?Body mass index is 25.86 kg/m?. ? ? ?  09/21/2021  ? 10:36 AM 09/01/2021  ? 10:11 AM 08/07/2021  ?  9:00 PM 02/09/2021  ? 10:52 AM 09/08/2020  ? 10:13 AM 11/07/2019  ?  1:51 AM 11/06/2019  ?  7:42 PM  ?Advanced Directives  ?Does Patient Have a Medical Advance Directive? Yes Yes Yes Yes Yes Yes Yes  ?Type of [place]aramed[place]of [place] will Living will;Healthcare Power of [place] will [place] will Living will;Healthcare Power of AVon OrmyLiving will [place]Living will  ?Does patient want to make changes to medical  advance directive?  No - Patient declined No - Patient declined  No - Patient declined No - Patient declined   ?Copy of [place] Chart? No - copy requested No - copy requested Yes - validated most recent copy scanned in chart (See row information)  No - copy requested Yes - validated most recent copy scanned in chart (See row information)   ?Would patient like information on creating a medical advance directive?   No - Patient declined      ? ? ?Current Medications (verified) ?Outpatient Encounter Medications as of 09/21/2021  ?Medication Sig  ? allopurinol (ZYLOPRIM) 100 MG tablet Take 0.5 tablets (50 mg total) by mouth daily.  ? carbamide peroxide (DEBROX) 6.5 % OTIC solution Place 5 drops into both ears 2 (two) times daily as needed (as needed for ear wax build up).  ? diltiazem (CARDIZEM CD) 120 MG 24 hr capsule TAKE (1) CAPSULE BY MOUTH ONCE DAILY.  ? docusate sodium (COLACE) 100 MG capsule Take 1 capsule (100 mg total) by mouth 2 (two) times daily.  ? HYDROcodone-acetaminophen (NORCO/VICODIN) 5-325 MG tablet TAKE 1 TABLET BY MOUTH EVERY 6 HOURS AS NEEDED.  ? levothyroxine (SYNTHROID) 75 MCG tablet TAKE ONE TABLET BY MOUTH ONCE DAILY.  ? lisinopril (ZESTRIL) 2.5 MG tablet Take 1 tablet (2.5 mg total) by mouth daily.  ? metoprolol succinate (TOPROL-XL) 50 MG 24 hr tablet TAKE 1 TABLET BY MOUTH DAILY. TAKE WITH OR IMMEDIATELY FOLLOWING A MEAL.  ? pantoprazole (PROTONIX) 40 MG tablet TAKE 1 TABLET BY MOUTH ONCE DAILY.  ?  polyethylene glycol powder (GLYCOLAX/MIRALAX) 17 GM/SCOOP powder Take 17 g by mouth daily as needed.  ? potassium chloride (KLOR-CON) 10 MEQ tablet Take 1 tablet (10 mEq total) by mouth every other day.  ? QC ACETAMINOPHEN 8 HOURS 650 MG CR tablet Take 1 tablet by mouth in the morning and at bedtime.  ? sertraline (ZOLOFT) 50 MG tablet TAKE 1 TABLET BY MOUTH ONCE A DAY.  ? torsemide (DEMADEX) 20 MG tablet Take 1 tablet (20 mg total) by mouth every other day.  ? trolamine  salicylate (ASPERCREME) 10 % cream Apply 1 application topically as needed (feet and shoulders).  ? vitamin B-12 1000 MCG tablet Take 1 tablet (1,000 mcg total) by mouth daily.  ? ?No facility-administered encounter medications on file as of 09/21/2021.  ? ? ?Allergies (verified) ?Amiodarone, Cephalosporins, Meloxicam, Robaxin [methocarbamol], Amoxicillin, Lovenox [enoxaparin sodium], Morphine, Morphine and related, Penicillins, Sulfa antibiotics, Lipitor [atorvastatin], Lorazepam, and Macrobid [nitrofurantoin]  ? ?History: ?Past Medical History:  ?Diagnosis Date  ? Abdominal pain, chronic, right lower quadrant   ? "ever since I had my appendix removed"  ? Atrial fibrillation ([place])   ? Breast cancer ([place])   ? Cancer [facility])   ? left side  ? Chronic back pain   ? Edema   ? GERD (gastroesophageal reflux disease)   ? Hyperlipidemia   ? Hypertension   ? Osteopenia   ? Osteoporosis   ? Ovarian cyst 02/2015  ? left  ? Recurrent abdominal pain   ? LLQ  ? ?Past Surgical History:  ?Procedure Laterality Date  ? APPENDECTOMY    ? APPENDECTOMY    ? BREAST SURGERY Left   ? lumpectomy  ? CARDIOVERSION N/A 03/31/2016  ? Procedure: CARDIOVERSION;  Surgeon: Josue Hector, MD;  Location: [facility];  Service: Cardiovascular;  Laterality: N/A;  ? COLON RESECTION    ? COLON SURGERY    ? HERNIA REPAIR    ? HERNIA REPAIR    ? ?Family History  ?Problem Relation Age of Onset  ? Diabetes Mother   ? Heart disease Other   ? Arthritis Other   ? Cancer Other   ? Hyperlipidemia Brother   ? ?Social History  ? ?Socioeconomic History  ? Marital status: Widowed  ?  Spouse name: Not on file  ? Number of children: 1  ? Years of education: Not on file  ? Highest education level: Not on file  ?Occupational History  ?  Employer: RETIRED  ?Tobacco Use  ? Smoking status: Never  ? Smokeless tobacco: Never  ?Vaping Use  ? Vaping Use: Never used  ?Substance and Sexual Activity  ? Alcohol use: No  ?  Alcohol/week: 0.0 standard drinks  ? Drug use: No  ? Sexual  activity: Never  ?Other Topics Concern  ? Not on file  ?Social History Narrative  ? Not on file  ? ?Social Determinants of Health  ? ?Financial Resource Strain: Low Risk   ? Difficulty of Paying Living Expenses: Not hard at all  ?Food Insecurity: No Food Insecurity  ? Worried About Charity fundraiser in the Last Year: Never true  ? Ran Out of Food in the Last Year: Never true  ?Transportation Needs: No Transportation Needs  ? Lack of Transportation (Medical): No  ? Lack of Transportation (Non-Medical): No  ?Physical Activity: Inactive  ? Days of Exercise per Week: 0 days  ? Minutes of Exercise per Session: 0 min  ?Stress: Stress Concern Present  ? Feeling of Stress :  To some extent  ?Social Connections: Moderately Isolated  ? Frequency of Communication with Friends and Family: More than three times a week  ? Frequency of Social Gatherings with Friends and Family: More than three times a week  ? Attends Religious Services: 1 to 4 times per year  ? Active Member of Clubs or Organizations: No  ? Attends Archivist Meetings: Never  ? Marital Status: Widowed  ? ? ?Tobacco Counseling ?Counseling given: Not Answered ? ? ?Clinical Intake: ? ?Pre-visit preparation completed: Yes ? ?Pain : 0-10 ?Pain Score: 4  ?Pain Type: Chronic pain ?Pain Location: Hip ?Pain Descriptors / Indicators: Aching, Dull ?Pain Onset: More than a month ago ?Pain Frequency: Intermittent ? ?  ? ?BMI - recorded: 25.86 ?Nutritional Status: BMI 25 -29 Overweight ?Nutritional Risks: None ?Diabetes: No ? ?How often do you need to have someone help you when you read instructions, pamphlets, or other written materials from your doctor or pharmacy?: 1 - Never ? ?Diabetic?no ? ?Interpreter Needed?: No ? ?Information entered by :: mj Atalie Oros, lpn ? ? ?Activities of Daily Living ? ?  09/21/2021  ? 10:39 AM 08/07/2021  ?  9:00 PM  ?In your present state of health, do you have any difficulty performing the following activities:  ?Hearing? 0 0  ?Vision? 0 0   ?Difficulty concentrating or making decisions? 0 0  ?Walking or climbing stairs? 1 1  ?Dressing or bathing? 0 1  ?Doing errands, shopping? 1 1  ?Preparing Food and eating ? N   ?Using the Toilet? N   ?In the past s

## 2021-09-22 DIAGNOSIS — E785 Hyperlipidemia, unspecified: Secondary | ICD-10-CM | POA: Diagnosis not present

## 2021-09-22 DIAGNOSIS — I480 Paroxysmal atrial fibrillation: Secondary | ICD-10-CM | POA: Diagnosis not present

## 2021-09-22 DIAGNOSIS — I4892 Unspecified atrial flutter: Secondary | ICD-10-CM | POA: Diagnosis not present

## 2021-09-22 DIAGNOSIS — M549 Dorsalgia, unspecified: Secondary | ICD-10-CM | POA: Diagnosis not present

## 2021-09-22 DIAGNOSIS — M10372 Gout due to renal impairment, left ankle and foot: Secondary | ICD-10-CM | POA: Diagnosis not present

## 2021-09-22 DIAGNOSIS — N179 Acute kidney failure, unspecified: Secondary | ICD-10-CM | POA: Diagnosis not present

## 2021-09-22 DIAGNOSIS — J479 Bronchiectasis, uncomplicated: Secondary | ICD-10-CM | POA: Diagnosis not present

## 2021-09-22 DIAGNOSIS — G4700 Insomnia, unspecified: Secondary | ICD-10-CM | POA: Diagnosis not present

## 2021-09-22 DIAGNOSIS — N1832 Chronic kidney disease, stage 3b: Secondary | ICD-10-CM | POA: Diagnosis not present

## 2021-09-22 DIAGNOSIS — K439 Ventral hernia without obstruction or gangrene: Secondary | ICD-10-CM | POA: Diagnosis not present

## 2021-09-22 DIAGNOSIS — I700 Atherosclerosis of aorta: Secondary | ICD-10-CM | POA: Diagnosis not present

## 2021-09-22 DIAGNOSIS — I129 Hypertensive chronic kidney disease with stage 1 through stage 4 chronic kidney disease, or unspecified chronic kidney disease: Secondary | ICD-10-CM | POA: Diagnosis not present

## 2021-09-22 DIAGNOSIS — M810 Age-related osteoporosis without current pathological fracture: Secondary | ICD-10-CM | POA: Diagnosis not present

## 2021-09-22 DIAGNOSIS — K56699 Other intestinal obstruction unspecified as to partial versus complete obstruction: Secondary | ICD-10-CM | POA: Diagnosis not present

## 2021-09-22 DIAGNOSIS — E039 Hypothyroidism, unspecified: Secondary | ICD-10-CM | POA: Diagnosis not present

## 2021-09-22 DIAGNOSIS — F419 Anxiety disorder, unspecified: Secondary | ICD-10-CM | POA: Diagnosis not present

## 2021-09-23 NOTE — Telephone Encounter (Signed)
Mychart message sent to patient.

## 2021-09-29 ENCOUNTER — Other Ambulatory Visit: Payer: Self-pay | Admitting: Family Medicine

## 2021-09-29 ENCOUNTER — Ambulatory Visit (INDEPENDENT_AMBULATORY_CARE_PROVIDER_SITE_OTHER): Payer: Medicare Other | Admitting: Family Medicine

## 2021-09-29 VITALS — BP 122/68 | HR 72 | Temp 97.9°F

## 2021-09-29 DIAGNOSIS — R300 Dysuria: Secondary | ICD-10-CM

## 2021-09-29 DIAGNOSIS — M25551 Pain in right hip: Secondary | ICD-10-CM

## 2021-09-29 NOTE — Progress Notes (Signed)
? ?  Subjective:  ? ? Patient ID: [name], female    DOB: [birth year], 88 y.o.   MRN: [id number] ? ?HPI ? ?Patient here for 6 week follow up ?Dysuria - Plan: Urine Culture, CANCELED: POCT urinalysis dipstick ? ?Right hip pain ? ?Patient wheelchair-bound able to walk a little bit with a walker has chronic hip pain low back pain uses hydrocodone sparingly uses it responsibly ?Review of Systems ?No reoccurrence of her bowel blockage recently ?Is having some dysuria was on antibiotics ?   ?Objective:  ? Physical Exam ?General-in no acute distress ?Eyes-no discharge ?Lungs-respiratory rate normal, CTA ?CV-no murmurs,RRR ?Extremities skin warm dry no edema ?Neuro grossly normal ?Behavior normal, alert ? ? ? ? ?   ?Assessment & Plan:  ?Hip arthritis uses hydrocodone sparingly caution drowsiness ? ?Intermittent dysuria despite being on antibiotics repeat urine culture await results ? ?No sign of reoccurrence of bowel blockage ? ?Follow-up in 3 months ? ?

## 2021-09-29 NOTE — Telephone Encounter (Signed)
Patient was seen today thank you ?

## 2021-09-29 NOTE — Telephone Encounter (Signed)
We have been unable to reach patient by phone- Patient has office visit scheduled 09/29/21 with provider ?

## 2021-10-04 MED ORDER — CIPROFLOXACIN HCL 250 MG PO TABS: 250.0000 mg | ORAL_TABLET | Freq: Two times a day (BID) | ORAL | 0 refills | Status: AC

## 2021-10-04 NOTE — Addendum Note (Signed)
Addended by: Dairl Ponder on: 10/04/2021 02:00 PM ? ? Modules accepted: Orders ? ?

## 2021-10-05 ENCOUNTER — Other Ambulatory Visit: Payer: Self-pay | Admitting: Family Medicine

## 2021-10-05 LAB — URINE CULTURE

## 2021-10-06 ENCOUNTER — Other Ambulatory Visit: Payer: Self-pay | Admitting: Family Medicine

## 2021-10-22 DIAGNOSIS — M549 Dorsalgia, unspecified: Secondary | ICD-10-CM | POA: Diagnosis not present

## 2021-10-22 DIAGNOSIS — M810 Age-related osteoporosis without current pathological fracture: Secondary | ICD-10-CM | POA: Diagnosis not present

## 2021-10-22 DIAGNOSIS — K56699 Other intestinal obstruction unspecified as to partial versus complete obstruction: Secondary | ICD-10-CM | POA: Diagnosis not present

## 2021-10-22 DIAGNOSIS — N1832 Chronic kidney disease, stage 3b: Secondary | ICD-10-CM | POA: Diagnosis not present

## 2021-10-22 DIAGNOSIS — G8929 Other chronic pain: Secondary | ICD-10-CM | POA: Diagnosis not present

## 2021-10-22 DIAGNOSIS — I4892 Unspecified atrial flutter: Secondary | ICD-10-CM | POA: Diagnosis not present

## 2021-10-22 DIAGNOSIS — I700 Atherosclerosis of aorta: Secondary | ICD-10-CM | POA: Diagnosis not present

## 2021-10-22 DIAGNOSIS — F419 Anxiety disorder, unspecified: Secondary | ICD-10-CM | POA: Diagnosis not present

## 2021-10-22 DIAGNOSIS — J479 Bronchiectasis, uncomplicated: Secondary | ICD-10-CM | POA: Diagnosis not present

## 2021-10-22 DIAGNOSIS — E039 Hypothyroidism, unspecified: Secondary | ICD-10-CM | POA: Diagnosis not present

## 2021-10-22 DIAGNOSIS — I480 Paroxysmal atrial fibrillation: Secondary | ICD-10-CM | POA: Diagnosis not present

## 2021-10-22 DIAGNOSIS — G4700 Insomnia, unspecified: Secondary | ICD-10-CM | POA: Diagnosis not present

## 2021-10-22 DIAGNOSIS — I129 Hypertensive chronic kidney disease with stage 1 through stage 4 chronic kidney disease, or unspecified chronic kidney disease: Secondary | ICD-10-CM | POA: Diagnosis not present

## 2021-10-22 DIAGNOSIS — K439 Ventral hernia without obstruction or gangrene: Secondary | ICD-10-CM | POA: Diagnosis not present

## 2021-10-22 DIAGNOSIS — M10372 Gout due to renal impairment, left ankle and foot: Secondary | ICD-10-CM | POA: Diagnosis not present

## 2021-10-22 DIAGNOSIS — E785 Hyperlipidemia, unspecified: Secondary | ICD-10-CM | POA: Diagnosis not present

## 2021-10-25 ENCOUNTER — Ambulatory Visit: Payer: Medicare Other | Admitting: Family Medicine

## 2021-10-26 ENCOUNTER — Other Ambulatory Visit: Payer: Self-pay | Admitting: Family Medicine

## 2021-11-03 ENCOUNTER — Other Ambulatory Visit: Payer: Self-pay | Admitting: Family Medicine

## 2021-11-03 ENCOUNTER — Ambulatory Visit (INDEPENDENT_AMBULATORY_CARE_PROVIDER_SITE_OTHER): Payer: Medicare Other | Admitting: *Deleted

## 2021-11-03 DIAGNOSIS — I4811 Longstanding persistent atrial fibrillation: Secondary | ICD-10-CM

## 2021-11-03 DIAGNOSIS — E785 Hyperlipidemia, unspecified: Secondary | ICD-10-CM

## 2021-11-03 NOTE — Chronic Care Management (AMB) (Signed)
Chronic Care Management   CCM RN Visit Note  11/03/2021 Name: [name] MRN: [id number] DOB: 12/03/1923  Subjective: [name] is a 88 y.o. year old female who is a primary care patient of Luking, Elayne Snare, MD. The care management team was consulted for assistance with disease management and care coordination needs.    Engaged with patient by telephone for follow up visit in response to provider referral for case management and/or care coordination services.   Consent to Services:  The patient was given information about Chronic Care Management services, agreed to services, and gave verbal consent prior to initiation of services.  Please see initial visit note for detailed documentation.   Patient agreed to services and verbal consent obtained.   Assessment: Review of patient past medical history, allergies, medications, health status, including review of consultants reports, laboratory and other test data, was performed as part of comprehensive evaluation and provision of chronic care management services.   SDOH (Social Determinants of Health) assessments and interventions performed:    CCM Care Plan  Allergies  Allergen Reactions   Amiodarone Itching   Cephalosporins Itching   Meloxicam Itching   Robaxin [Methocarbamol] Itching   Amoxicillin Hives   Lovenox [Enoxaparin Sodium] Nausea And Vomiting   Morphine    Morphine And Related Itching   Penicillins Swelling        Sulfa Antibiotics Swelling   Lipitor [Atorvastatin] Other (See Comments)    Unknown:patient is not aware of this allergy   Lorazepam Other (See Comments)    Exceptional fatigue    Macrobid [Nitrofurantoin] Other (See Comments)    Constipation    Outpatient Encounter Medications as of 11/03/2021  Medication Sig Note   allopurinol (ZYLOPRIM) 100 MG tablet Take 0.5 tablets (50 mg total) by mouth daily.    carbamide peroxide (DEBROX) 6.5 % OTIC solution Place 5 drops into both ears 2 (two) times  daily as needed (as needed for ear wax build up).    diltiazem (CARDIZEM CD) 120 MG 24 hr capsule TAKE (1) CAPSULE BY MOUTH ONCE DAILY.    docusate sodium (COLACE) 100 MG capsule Take 1 capsule (100 mg total) by mouth 2 (two) times daily.    HYDROcodone-acetaminophen (NORCO/VICODIN) 5-325 MG tablet TAKE 1 TABLET BY MOUTH EVERY 6 HOURS AS NEEDED.    levothyroxine (SYNTHROID) 75 MCG tablet TAKE ONE TABLET BY MOUTH ONCE DAILY.    lisinopril (ZESTRIL) 2.5 MG tablet Take 1 tablet (2.5 mg total) by mouth daily.    metoprolol succinate (TOPROL-XL) 50 MG 24 hr tablet TAKE 1 TABLET BY MOUTH DAILY. TAKE WITH OR IMMEDIATELY FOLLOWING A MEAL.    pantoprazole (PROTONIX) 40 MG tablet TAKE 1 TABLET BY MOUTH ONCE DAILY.    polyethylene glycol powder (GLYCOLAX/MIRALAX) 17 GM/SCOOP powder Take 17 g by mouth daily as needed. 07/22/2021: prn   potassium chloride (KLOR-CON) 10 MEQ tablet Take 1 tablet (10 mEq total) by mouth every other day.    QC ACETAMINOPHEN 8 HOURS 650 MG CR tablet Take 1 tablet by mouth in the morning and at bedtime.    sertraline (ZOLOFT) 50 MG tablet TAKE 1 TABLET BY MOUTH ONCE A DAY.    torsemide (DEMADEX) 20 MG tablet TAKE (1) TABLET BY MOUTH EVERY DAY.    trolamine salicylate (ASPERCREME) 10 % cream Apply 1 application topically as needed (feet and shoulders).    vitamin B-12 1000 MCG tablet Take 1 tablet (1,000 mcg total) by mouth daily.    No facility-administered encounter  medications on file as of 11/03/2021.    Patient Active Problem List   Diagnosis Date Noted   Acute gout due to renal impairment involving foot 08/09/2021   AKI (acute kidney injury) on stage 3b CKD  08/08/2021   SBO (small bowel obstruction) (HCC) 11/07/2019   Nausea and vomiting 11/07/2019   Hyperglycemia 11/07/2019   Right hip pain 08/01/2019   GI bleed 02/16/2019   HLD (hyperlipidemia) 07/18/2017   B12 deficiency 07/18/2017   SAH (subarachnoid hemorrhage) ([place]) 07/17/2017   ICH (intracerebral hemorrhage)  ([place]) 07/16/2017   Cervical disc disease 05/09/2017   Hypothyroid 05/09/2017   Atrial fibrillation ([place]) 01/12/2016   Osteoarthritis of right knee 12/15/2015   Major depression 09/22/2015   Ventral hernia 05/21/2015   Ovarian tumor 04/13/2015   Squamous cell skin cancer 08/13/2014   Impaired fasting glucose 08/05/2013   Neuropathy 01/11/2013   Anxiety 01/29/2012   GERD (gastroesophageal reflux disease) 01/29/2012   Sciatica of right side 05/10/2011    Conditions to be addressed/monitored:Atrial Fibrillation and HLD  Care Plan : RN Care Manager Plan of Care  Updates made by Kassie Mends, RN since 11/03/2021 12:00 AM     Problem: No plan of care established for management of chronic disease state  (Atrial fibrillation, HLD)   Priority: High     Long-Range Goal: Development of plan of care for chronic disease management  (Atrial fibrillation, HLD)   Start Date: 09/01/2021  Expected End Date: 02/28/2022  Priority: High  Note:   Current Barriers:  Knowledge Deficits related to plan of care for management of Atrial Fibrillation and HLD  Spoke with patient who reports she lives alone, her adult son checks in on her frequently, has hired caregiver to assist with grocery shopping, washing clothes etc every Wednesday, pt has walker that she uses at all times, has Life Alert necklace, has had one fall this past year, pt is unable to exercise due to chronic pain but does walk through her house as much as she can. Patient reports she has all medications and takes as prescribed. Update- patient reports she has nausea today with several episodes of vomiting, has been able to keep small amounts of ginger ale down, nausea has been worse over the past few days and zofran has not helped, pt denies fever, had normal bowel movement yesterday and has diarrhea today.  RNCM Clinical Goal(s):  Patient will verbalize understanding of plan for management of Atrial Fibrillation and HLD as evidenced by  patient report, review of EHR and  through collaboration with RN Care manager, provider, and care team.   Interventions: 1:1 collaboration with primary care provider regarding development and update of comprehensive plan of care as evidenced by provider attestation and co-signature Inter-disciplinary care team collaboration (see longitudinal plan of care) Evaluation of current treatment plan related to  self management and patient's adherence to plan as established by provider   AFIB Interventions: (Status:  New goal. and Goal on track:  Yes.) Long Term Goal   Counseled on importance of regular laboratory monitoring as prescribed Afib action plan reviewed Reviewed importance of knowing how you feel every day and calling doctor early on for change in health status, symptoms  Reviewed energy conservation  Hyperlipidemia:  (Status: New goal. Goal on Track (progressing): YES.) Long Term Goal  Lab Results  Component Value Date   CHOL 185 07/18/2017   HDL 72 07/18/2017   LDLCALC 85 07/18/2017   TRIG 139 07/18/2017   CHOLHDL 2.6 07/18/2017  Medication review performed; medication list updated in electronic medical record.  Provider established cholesterol goals reviewed; Reviewed importance of limiting foods high in cholesterol; Reviewed importance of staying well hydrated during sickness Reviewed heart healthy diet Safety precautions reinforced In basket sent to primary care provider reporting nausea, vomiting and zofran not working, ask that primary care provider office advise and call patient to be seen if appropriate  Patient Goals/Self-Care Activities: Take medications as prescribed   Attend all scheduled provider appointments Call pharmacy for medication refills 3-7 days in advance of running out of medications Call provider office for new concerns or questions  - check pulse (heart) rate before taking medicine - make a plan to eat healthy - keep all lab appointments - take  medicine as prescribed - call for medicine refill 2 or 3 days before it runs out - take all medications exactly as prescribed - call doctor with any symptoms you believe are related to your medicine - call doctor when you experience any new symptoms - go to all doctor appointments as scheduled - adhere to prescribed diet: heart healthy Drink enough fluids especially when you are sick Bake or broil foods instead of frying Avoid trans/ saturated fats Limit fast food Expect a call from your primary care doctor's office about your nausea and vomiting, this has been reported fall prevention strategies: change position slowly, use assistive device such as walker or cane (per provider recommendations) when walking, keep walkways clear, have good lighting in room. It is important to contact your provider if you have any falls, maintain muscle strength/tone by exercise per provider recommendations.       Plan:Telephone follow up appointment with care management team member scheduled for:  12/08/21  Jacqlyn Larsen [facility], BSN RN Case Manager [place] [phone]

## 2021-11-03 NOTE — Patient Instructions (Signed)
Visit Information  Thank you for taking time to visit with me today. Please don't hesitate to contact me if I can be of assistance to you before our next scheduled telephone appointment.  Following are the goals we discussed today:  Patient Goals/Self-Care Activities: Take medications as prescribed   Attend all scheduled provider appointments Call pharmacy for medication refills 3-7 days in advance of running out of medications Call provider office for new concerns or questions  check pulse (heart) rate before taking medicine make a plan to eat healthy keep all lab appointments take medicine as prescribed call for medicine refill 2 or 3 days before it runs out take all medications exactly as prescribed call doctor with any symptoms you believe are related to your medicine call doctor when you experience any new symptoms go to all doctor appointments as scheduled adhere to prescribed diet: heart healthy Drink enough fluids especially when you are sick Bake or broil foods instead of frying Avoid trans/ saturated fats Limit fast food Expect a call from your primary care doctor's office about your nausea and vomiting, this has been reported fall prevention strategies: change position slowly, use assistive device such as walker or cane (per provider recommendations) when walking, keep walkways clear, have good lighting in room. It is important to contact your provider if you have any falls, maintain muscle strength/tone by exercise per provider recommendations.  Our next appointment is by telephone on 12/08/21 at 215 pm  Please call the care guide team at [phone] if you need to cancel or reschedule your appointment.   If you are experiencing a Mental Health or [place] or need someone to talk to, please call the Suicide and Crisis Lifeline: 988 call the [place] National Suicide Prevention Lifeline: 7054662840 or TTY: 678-771-5605 TTY 820 298 6771) to talk to a trained  counselor call 1-800-273-TALK (toll free, 24 hour hotline) go to [facility] [address] [place] [phone]) call the [place]: [phone] call 911   Patient verbalizes understanding of instructions and care plan provided today and agrees to view in [place]. Active MyChart status and patient understanding of how to access instructions and care plan via MyChart confirmed with patient.     Jacqlyn Larsen RNC, BSN RN Case Manager [facility] [phone] Nausea and Vomiting, Adult Nausea is feeling that you have an upset stomach and that you are about to vomit. Vomiting is when food in your stomach forcefully comes out of your mouth. Vomiting can make you feel weak. If you vomit, or if you are not able to drink enough fluids, you may not have enough water in your body (get dehydrated). If you do not have enough water in your body, you may: Feel tired. Feel thirsty. Have a dry mouth. Have cracked lips. Pee (urinate) less often. Older adults and people with other diseases or a weak body defense system (immune system) are at higher risk for not having enough water in the body. If you feel like you may vomit or you vomit, it is important to follow instructions from your doctor about how to take care of yourself. Follow these instructions at home: Watch your symptoms for any changes. Tell your doctor about them. Eating and drinking     Take an ORS (oral rehydration solution). This is a drink that is sold at pharmacies and stores. Drink clear fluids in small amounts as you are able, such as: Water. Ice chips. Fruit juice that has water added (  diluted fruit juice). Low-calorie sports drinks. Eat bland, easy-to-digest foods in small amounts as you are able, such as: Bananas. Applesauce. Rice. Low-fat (lean) meats. Toast. Crackers. Avoid drinking fluids that have a lot of sugar or caffeine in them. This  includes energy drinks, sports drinks, and soda. Avoid alcohol. Avoid spicy or fatty foods. General instructions Take over-the-counter and prescription medicines only as told by your doctor. Drink enough fluid to keep your pee (urine) pale yellow. Wash your hands often with soap and water for at least 20 seconds. If you cannot use soap and water, use hand sanitizer. Make sure that everyone in your home washes their hands well and often. Rest at home until you feel better. Watch your condition for any changes. Take slow and deep breaths when you feel like you may vomit. Keep all follow-up visits. Contact a doctor if: Your symptoms get worse. You have new symptoms. You have a fever. You cannot drink fluids without vomiting. You feel like you may vomit for more than 2 days. You feel light-headed or dizzy. You have a headache. You have muscle cramps. You have a rash. You have pain while peeing. Get help right away if: You have pain in your chest, neck, arm, or jaw. You feel very weak or you faint. You vomit again and again. You have vomit that is bright red or looks like black coffee grounds. You have bloody or black poop (stools) or poop that looks like tar. You have a very bad headache, a stiff neck, or both. You have very bad pain, cramping, or bloating in your belly (abdomen). You have trouble breathing. You are breathing very quickly. Your heart is beating very quickly. Your skin feels cold and clammy. You feel confused. You have signs of losing too much water in your body, such as: Dark pee, very little pee, or no pee. Cracked lips. Dry mouth. Sunken eyes. Sleepiness. Weakness. These symptoms may be an emergency. Get help right away. Call 911. Do not wait to see if the symptoms will go away. Do not drive yourself to the hospital. Summary Nausea is feeling that you have an upset stomach and that you are about to vomit. Vomiting is when food in your stomach comes out of  your mouth. Follow instructions from your doctor about eating and drinking. Take over-the-counter and prescription medicines only as told by your doctor. Contact your doctor if your symptoms get worse or you have new symptoms. Keep all follow-up visits. This information is not intended to replace advice given to you by your health care provider. Make sure you discuss any questions you have with your health care provider. Document Revised: 11/13/2020 Document Reviewed: 11/13/2020 Elsevier Patient Education  [place].

## 2021-11-05 ENCOUNTER — Emergency Department (HOSPITAL_COMMUNITY)
Admission: EM | Admit: 2021-11-05 | Discharge: 2021-11-05 | Disposition: A | Discharge status: Home / Self Care | Payer: Medicare Other | Attending: Emergency Medicine | Admitting: Emergency Medicine

## 2021-11-05 ENCOUNTER — Other Ambulatory Visit: Payer: Self-pay

## 2021-11-05 ENCOUNTER — Encounter (HOSPITAL_COMMUNITY): Payer: Self-pay

## 2021-11-05 DIAGNOSIS — Z79899 Other long term (current) drug therapy: Secondary | ICD-10-CM | POA: Diagnosis not present

## 2021-11-05 DIAGNOSIS — R197 Diarrhea, unspecified: Secondary | ICD-10-CM | POA: Diagnosis not present

## 2021-11-05 DIAGNOSIS — R110 Nausea: Secondary | ICD-10-CM | POA: Diagnosis not present

## 2021-11-05 DIAGNOSIS — I10 Essential (primary) hypertension: Secondary | ICD-10-CM | POA: Diagnosis not present

## 2021-11-05 DIAGNOSIS — N390 Urinary tract infection, site not specified: Secondary | ICD-10-CM | POA: Diagnosis not present

## 2021-11-05 DIAGNOSIS — R0902 Hypoxemia: Secondary | ICD-10-CM | POA: Diagnosis not present

## 2021-11-05 DIAGNOSIS — R52 Pain, unspecified: Secondary | ICD-10-CM | POA: Diagnosis not present

## 2021-11-05 DIAGNOSIS — R112 Nausea with vomiting, unspecified: Secondary | ICD-10-CM | POA: Diagnosis not present

## 2021-11-05 DIAGNOSIS — R0600 Dyspnea, unspecified: Secondary | ICD-10-CM | POA: Diagnosis not present

## 2021-11-05 LAB — CBC WITH DIFFERENTIAL/PLATELET
Abs Immature Granulocytes: 0.04 10*3/uL (ref 0.00–0.07)
Basophils Absolute: 0 10*3/uL (ref 0.0–0.1)
Basophils Relative: 0 %
Eosinophils Absolute: 0.1 10*3/uL (ref 0.0–0.5)
Eosinophils Relative: 1 %
HCT: 32.9 % — ABNORMAL LOW (ref 36.0–46.0)
Hemoglobin: 10.7 g/dL — ABNORMAL LOW (ref 12.0–15.0)
Immature Granulocytes: 0 %
Lymphocytes Relative: 18 %
Lymphs Abs: 1.7 10*3/uL (ref 0.7–4.0)
MCH: 29.4 pg (ref 26.0–34.0)
MCHC: 32.5 g/dL (ref 30.0–36.0)
MCV: 90.4 fL (ref 80.0–100.0)
Monocytes Absolute: 0.7 10*3/uL (ref 0.1–1.0)
Monocytes Relative: 7 %
Neutro Abs: 6.7 10*3/uL (ref 1.7–7.7)
Neutrophils Relative %: 74 %
Platelets: 243 10*3/uL (ref 150–400)
RBC: 3.64 MIL/uL — ABNORMAL LOW (ref 3.87–5.11)
RDW: 12.6 % (ref 11.5–15.5)
WBC: 9.2 10*3/uL (ref 4.0–10.5)
nRBC: 0 % (ref 0.0–0.2)

## 2021-11-05 LAB — URINALYSIS, ROUTINE W REFLEX MICROSCOPIC
Bilirubin Urine: NEGATIVE
Glucose, UA: NEGATIVE mg/dL
Hgb urine dipstick: NEGATIVE
Ketones, ur: NEGATIVE mg/dL
Nitrite: NEGATIVE
Protein, ur: NEGATIVE mg/dL
Specific Gravity, Urine: 1.012 (ref 1.005–1.030)
pH: 6 (ref 5.0–8.0)

## 2021-11-05 LAB — COMPREHENSIVE METABOLIC PANEL
ALT: 14 U/L (ref 0–44)
AST: 21 U/L (ref 15–41)
Albumin: 3.9 g/dL (ref 3.5–5.0)
Alkaline Phosphatase: 62 U/L (ref 38–126)
Anion gap: 12 (ref 5–15)
BUN: 40 mg/dL — ABNORMAL HIGH (ref 8–23)
CO2: 23 mmol/L (ref 22–32)
Calcium: 9.1 mg/dL (ref 8.9–10.3)
Chloride: 102 mmol/L (ref 98–111)
Creatinine, Ser: 1.7 mg/dL — ABNORMAL HIGH (ref 0.44–1.00)
GFR, Estimated: 27 mL/min — ABNORMAL LOW (ref >60)
Glucose, Bld: 137 mg/dL — ABNORMAL HIGH (ref 70–99)
Potassium: 3.6 mmol/L (ref 3.5–5.1)
Sodium: 137 mmol/L (ref 135–145)
Total Bilirubin: 0.5 mg/dL (ref 0.3–1.2)
Total Protein: 7 g/dL (ref 6.5–8.1)

## 2021-11-05 LAB — MAGNESIUM: Magnesium: 1.9 mg/dL (ref 1.7–2.4)

## 2021-11-05 MED ORDER — SODIUM CHLORIDE 0.9 % IV BOLUS
500.0000 mL | Freq: Once | INTRAVENOUS | Status: AC
  Administered 2021-11-05: 500 mL via INTRAVENOUS

## 2021-11-05 MED ORDER — ONDANSETRON 4 MG PO TBDP: 4.0000 mg | ORAL_TABLET | Freq: Three times a day (TID) | ORAL | 0 refills | Status: DC | PRN

## 2021-11-05 MED ORDER — ONDANSETRON HCL 4 MG/2ML IJ SOLN
4.0000 mg | Freq: Once | INTRAMUSCULAR | Status: AC
  Administered 2021-11-05: 4 mg via INTRAVENOUS
  Filled 2021-11-05: qty 2

## 2021-11-05 MED ORDER — LOPERAMIDE HCL 2 MG PO CAPS
4.0000 mg | ORAL_CAPSULE | Freq: Once | ORAL | Status: DC
  Filled 2021-11-05 (×2): qty 2

## 2021-11-05 MED ORDER — CIPROFLOXACIN HCL 500 MG PO TABS: 500.0000 mg | ORAL_TABLET | Freq: Two times a day (BID) | ORAL | 0 refills | Status: DC

## 2021-11-05 NOTE — ED Notes (Signed)
Patient given drink to see if she can hold it down.  Patient is currently sitting up in the bed talking with pharmacy.

## 2021-11-05 NOTE — ED Notes (Signed)
Patient able to keep drink and food down.  States that she doesn't feel nauseous and that she feels a little better now that she has eaten.

## 2021-11-05 NOTE — ED Triage Notes (Signed)
Patient brought in by [facility] for nausea and vomiting that has been going on x 2 days.

## 2021-11-05 NOTE — Discharge Instructions (Signed)
Call your primary care doctor or specialist as discussed in the next 2-3 days.   Return immediately back to the ER if:  Your symptoms worsen within the next 12-24 hours. You develop new symptoms such as new fevers, persistent vomiting, new pain, shortness of breath, or new weakness or numbness, or if you have any other concerns.  

## 2021-11-05 NOTE — ED Provider Notes (Signed)
[facility] Provider Note   CSN: [id number] Arrival date & time: 11/05/21  1101     History  Chief Complaint  Patient presents with   Nausea   Emesis    [name] is a 88 y.o. female.  Patient presents to ER with chief complaint nausea vomiting diarrhea ongoing for 2 days.  Describes as nonbloody vomiting diarrhea.  Denies any associated chest pain or abdominal pain.  Denies any fevers or cough.  She states the symptoms have been going on and off for the past several months.  She will go through stretches where she gets the symptoms and the stretches which she feels fine.       Home Medications Prior to Admission medications   Medication Sig Start Date End Date Taking? Authorizing Provider  allopurinol (ZYLOPRIM) 100 MG tablet Take 0.5 tablets (50 mg total) by mouth daily. 08/10/21  Yes Johnson, Clanford L, MD  carbamide peroxide (DEBROX) 6.5 % OTIC solution Place 5 drops into both ears 2 (two) times daily as needed (as needed for ear wax build up). 08/31/20  Yes Scot Jun, FNP  diltiazem (CARDIZEM CD) 120 MG 24 hr capsule TAKE (1) CAPSULE BY MOUTH ONCE DAILY. Patient taking differently: Take 120 mg by mouth daily. 11/03/21  Yes Luking, Elayne Snare, MD  HYDROcodone-acetaminophen (NORCO/VICODIN) 5-325 MG tablet TAKE 1 TABLET BY MOUTH EVERY 6 HOURS AS NEEDED. Patient taking differently: Take 1 tablet by mouth every 6 (six) hours as needed for moderate pain. 10/27/21  Yes [name], MD  levothyroxine (SYNTHROID) 75 MCG tablet TAKE ONE TABLET BY MOUTH ONCE DAILY. Patient taking differently: Take 75 mcg by mouth daily before breakfast. 09/06/21  Yes Luking, Elayne Snare, MD  lisinopril (ZESTRIL) 2.5 MG tablet Take 1 tablet (2.5 mg total) by mouth daily. 09/02/21  Yes Luking, Elayne Snare, MD  metoprolol succinate (TOPROL-XL) 50 MG 24 hr tablet TAKE 1 TABLET BY MOUTH DAILY. TAKE WITH OR IMMEDIATELY FOLLOWING A MEAL. Patient taking differently: Take 50 mg by mouth  daily. 11/03/21  Yes [name], MD  Multiple Vitamin (MULTIVITAMIN WITH MINERALS) TABS tablet Take 1 tablet by mouth daily.   Yes [provider]  ondansetron (ZOFRAN-ODT) 4 MG disintegrating tablet Take 1 tablet (4 mg total) by mouth every 8 (eight) hours as needed for up to 10 doses for nausea or vomiting. 11/05/21  Yes [place], Greggory Brandy, MD  oxymetazoline (AFRIN) 0.05 % nasal spray Place 1 spray into both nostrils 2 (two) times daily.   Yes [provider]  pantoprazole (PROTONIX) 40 MG tablet TAKE 1 TABLET BY MOUTH ONCE DAILY. Patient taking differently: Take 40 mg by mouth daily. 11/03/21  Yes Luking, Elayne Snare, MD  polyethylene glycol powder (GLYCOLAX/MIRALAX) 17 GM/SCOOP powder Take 17 g by mouth daily.   Yes [provider]  potassium chloride (KLOR-CON) 10 MEQ tablet Take 1 tablet (10 mEq total) by mouth every other day. 08/13/21  Yes Johnson, Clanford L, MD  Propylene Glycol (SYSTANE COMPLETE OP) Apply 1 drop to eye daily as needed (dryness).   Yes [provider]  QC ACETAMINOPHEN 8 HOURS 650 MG CR tablet Take 1 tablet by mouth in the morning and at bedtime. 10/23/19  Yes [provider]  sertraline (ZOLOFT) 50 MG tablet TAKE 1 TABLET BY MOUTH ONCE A DAY. Patient taking differently: Take 50 mg by mouth daily. 06/13/21  Yes [name], MD  torsemide (DEMADEX) 20 MG tablet TAKE (1) TABLET BY  MOUTH EVERY DAY. Patient taking differently: Take 20 mg by mouth daily. 11/03/21  Yes [name], MD  trolamine salicylate (ASPERCREME) 10 % cream Apply 1 application topically as needed (feet and shoulders).   Yes [provider]  vitamin B-12 1000 MCG tablet Take 1 tablet (1,000 mcg total) by mouth daily. 07/21/17  Yes Costello, Kayren Eaves, NP      Allergies    Amiodarone, Cephalosporins, Meloxicam, Robaxin [methocarbamol], Amoxicillin, Lovenox [enoxaparin sodium], Morphine, Morphine and related, Penicillins, Sulfa antibiotics, Lipitor  [atorvastatin], Lorazepam, and Macrobid [nitrofurantoin]    Review of Systems   Review of Systems  Constitutional:  Negative for fever.  HENT:  Negative for ear pain.   Eyes:  Negative for pain.  Respiratory:  Negative for cough.   Cardiovascular:  Negative for chest pain.  Gastrointestinal:  Negative for abdominal pain.  Genitourinary:  Negative for flank pain.  Musculoskeletal:  Negative for back pain.  Skin:  Negative for rash.  Neurological:  Negative for headaches.    Physical Exam Updated Vital Signs BP (!) 149/64   Pulse 72   Temp 98.3 F (36.8 C) (Oral)   Resp 17   Ht '5\' 4"'$  (1.626 m)   Wt 61.2 kg   SpO2 94%   BMI 23.17 kg/m  Physical Exam Constitutional:      General: She is not in acute distress.    Appearance: Normal appearance.  HENT:     Head: Normocephalic.     Nose: Nose normal.  Eyes:     Extraocular Movements: Extraocular movements intact.  Cardiovascular:     Rate and Rhythm: Normal rate.  Pulmonary:     Effort: Pulmonary effort is normal.  Abdominal:     Tenderness: There is no abdominal tenderness. There is no guarding or rebound.  Musculoskeletal:        General: Normal range of motion.     Cervical back: Normal range of motion.  Neurological:     General: No focal deficit present.     Mental Status: She is alert. Mental status is at baseline.     ED Results / Procedures / Treatments   Labs (all labs ordered are listed, but only abnormal results are displayed) Labs Reviewed  COMPREHENSIVE METABOLIC PANEL - Abnormal; Notable for the following components:      Result Value   Glucose, Bld 137 (*)    BUN 40 (*)    Creatinine, Ser 1.70 (*)    GFR, Estimated 27 (*)    All other components within normal limits  CBC WITH DIFFERENTIAL/PLATELET - Abnormal; Notable for the following components:   RBC 3.64 (*)    Hemoglobin 10.7 (*)    HCT 32.9 (*)    All other components within normal limits  MAGNESIUM    EKG EKG  Interpretation  Date/Time:  Friday November 05 2021 12:06:20 EDT Ventricular Rate:  71 PR Interval:    QRS Duration: 93 QT Interval:  401 QTC Calculation: 436 R Axis:   -13 Text Interpretation: Accelerated junctional rhythm Inferior infarct, old Confirmed by Thamas Jaegers ([id number]) on 11/05/2021 12:13:07 PM  Radiology No results found.  Procedures Procedures    Medications Ordered in ED Medications  loperamide (IMODIUM) capsule 4 mg (4 mg Oral Not Given 11/05/21 1232)  sodium chloride 0.9 % bolus 500 mL (0 mLs Intravenous Stopped 11/05/21 1229)  ondansetron (ZOFRAN) injection 4 mg (4 mg Intravenous Given 11/05/21 1225)    ED Course/ Medical Decision Making/ A&P  Medical Decision Making Amount and/or Complexity of Data Reviewed Labs: ordered.  Risk Prescription drug management.   Review of external records shows office visit Sep 29, 2021 for dysuria.  History obtained from family bedside.  Cardiac monitoring showing sinus rhythm.  Diagnosis does include labs White count 9 hemoglobin 10 chemistry unremarkable mild renal insufficiency noted.  Patient given IV fluid resuscitation.  Given Zofran.  Subsequently able to tolerate oral intake ate a small meal here without vomiting.  Family at bedside, advised outpatient follow-up with her doctor within the week.  Advising immediate return for recurrent or worsening symptoms or any additional concerns, otherwise the patient is not tolerating oral intake, has a nontender abdominal exam, discharged home in stable condition.        Final Clinical Impression(s) / ED Diagnoses Final diagnoses:  Nausea and vomiting, unspecified vomiting type    Rx / DC Orders ED Discharge Orders          Ordered    ondansetron (ZOFRAN-ODT) 4 MG disintegrating tablet  Every 8 hours PRN        11/05/21 Dona Ana, Khylan Sawyer S, MD 11/05/21 [phone]

## 2021-11-19 DIAGNOSIS — I4891 Unspecified atrial fibrillation: Secondary | ICD-10-CM

## 2021-11-19 DIAGNOSIS — E785 Hyperlipidemia, unspecified: Secondary | ICD-10-CM | POA: Diagnosis not present

## 2021-11-29 ENCOUNTER — Ambulatory Visit (INDEPENDENT_AMBULATORY_CARE_PROVIDER_SITE_OTHER): Payer: Medicare Other | Admitting: Family Medicine

## 2021-11-29 VITALS — BP 130/72 | HR 64 | Temp 97.9°F

## 2021-11-29 DIAGNOSIS — M25551 Pain in right hip: Secondary | ICD-10-CM

## 2021-11-29 DIAGNOSIS — N289 Disorder of kidney and ureter, unspecified: Secondary | ICD-10-CM | POA: Diagnosis not present

## 2021-11-29 DIAGNOSIS — F419 Anxiety disorder, unspecified: Secondary | ICD-10-CM

## 2021-11-29 DIAGNOSIS — R112 Nausea with vomiting, unspecified: Secondary | ICD-10-CM

## 2021-11-29 MED ORDER — HYDROCODONE-ACETAMINOPHEN 5-325 MG PO TABS: 1.0000 | ORAL_TABLET | Freq: Four times a day (QID) | ORAL | 0 refills | Status: DC | PRN

## 2021-11-29 MED ORDER — SERTRALINE HCL 50 MG PO TABS: 50.0000 mg | ORAL_TABLET | Freq: Every day | ORAL | 6 refills | Status: DC

## 2021-11-29 MED ORDER — LISINOPRIL 2.5 MG PO TABS: 2.5000 mg | ORAL_TABLET | Freq: Every day | ORAL | 1 refills | Status: DC

## 2021-11-29 MED ORDER — DILTIAZEM HCL ER COATED BEADS 120 MG PO CP24: 120.0000 mg | ORAL_CAPSULE | Freq: Every day | ORAL | 5 refills | Status: DC

## 2021-11-29 MED ORDER — PANTOPRAZOLE SODIUM 40 MG PO TBEC: 40.0000 mg | DELAYED_RELEASE_TABLET | Freq: Every day | ORAL | 0 refills | Status: DC

## 2021-11-29 MED ORDER — METOPROLOL SUCCINATE ER 50 MG PO TB24: 50.0000 mg | ORAL_TABLET | Freq: Every day | ORAL | 0 refills | Status: DC

## 2021-11-29 NOTE — Progress Notes (Signed)
   Subjective:    Patient ID: [name], female    DOB: 1923-08-21, 88 y.o.   MRN: [id number]  HPI  Patient was seen at [facility] on 11/05/21 for nausea and vomiting. Patient has a history of small bowel obstruction Actually doing somewhat better today Did have a vomiting spell a few days ago Does have some intermittent nausea Denies any abdominal pain Appetite doing well Is drinking prune juice with a little bit of limited mixed in she states this helps keeps her bowel movements soft She does have chronic pain and discomfort in her hips worse on the right side than left side.  In addition to this patient finds herself anxious at times between her own health as well as the health of her son who is going through chemotherapy Review of Systems     Objective:   Physical Exam  General-in no acute distress Eyes-no discharge Lungs-respiratory rate normal, CTA CV-no murmurs,RRR Extremities skin warm dry no edema Neuro grossly normal Behavior normal, alert Subjective discomfort low back right hip no obvious issues going on currently      Assessment & Plan:   Follow-up kidney function ordered Importance of medication adherence discussed Low-dose hydrocodone for severe hip pain was given caution drowsiness Tylenol alone does not help she is taking this medication intermittently before and has been warned regarding increased risk of falls Also explained to her no need to treat her anxiety with Xanax because of increased risk of injuries and falls She is also had intermittent small bowel partial obstructions but no need to do any type of surgical intervention currently.  This is resolved currently. Follow-up within 3 months

## 2021-11-30 ENCOUNTER — Other Ambulatory Visit: Payer: Self-pay | Admitting: Family Medicine

## 2021-11-30 LAB — BASIC METABOLIC PANEL
BUN/Creatinine Ratio: 16 (ref 12–28)
BUN: 24 mg/dL (ref 10–36)
CO2: 23 mmol/L (ref 20–29)
Calcium: 9.5 mg/dL (ref 8.7–10.3)
Chloride: 102 mmol/L (ref 96–106)
Creatinine, Ser: 1.5 mg/dL — ABNORMAL HIGH (ref 0.57–1.00)
Glucose: 80 mg/dL (ref 70–99)
Potassium: 4.6 mmol/L (ref 3.5–5.2)
Sodium: 141 mmol/L (ref 134–144)
eGFR: 32 mL/min/{1.73_m2} — ABNORMAL LOW (ref >59)

## 2021-12-02 ENCOUNTER — Other Ambulatory Visit: Payer: Self-pay

## 2021-12-02 MED ORDER — TORSEMIDE 20 MG PO TABS: ORAL_TABLET | ORAL | 0 refills | Status: DC

## 2021-12-06 DIAGNOSIS — I739 Peripheral vascular disease, unspecified: Secondary | ICD-10-CM | POA: Diagnosis not present

## 2021-12-06 DIAGNOSIS — L609 Nail disorder, unspecified: Secondary | ICD-10-CM | POA: Diagnosis not present

## 2021-12-06 DIAGNOSIS — M79671 Pain in right foot: Secondary | ICD-10-CM | POA: Diagnosis not present

## 2021-12-06 DIAGNOSIS — L110 Acquired keratosis follicularis: Secondary | ICD-10-CM | POA: Diagnosis not present

## 2021-12-08 ENCOUNTER — Telehealth: Payer: Medicare Other

## 2021-12-08 ENCOUNTER — Telehealth: Payer: Self-pay | Admitting: *Deleted

## 2021-12-08 NOTE — Telephone Encounter (Signed)
  Care Management   Follow Up Note   12/08/2021 Name: MARLENNE RIDGE MRN: [id number] DOB: 1924-04-23   Referred by: [name], MD Reason for referral : Chronic Care Management   An unsuccessful telephone outreach was attempted today. The patient was referred to the case management team for assistance with care management and care coordination.   Follow Up Plan: Telephone follow up appointment with care management team member scheduled for:  upon care guide rescheduling.  Jacqlyn Larsen [facility], BSN RN Case Manager [facility] [phone]

## 2021-12-16 ENCOUNTER — Ambulatory Visit (INDEPENDENT_AMBULATORY_CARE_PROVIDER_SITE_OTHER): Payer: Medicare Other | Admitting: Family Medicine

## 2021-12-16 ENCOUNTER — Ambulatory Visit (HOSPITAL_COMMUNITY)
Admission: RE | Admit: 2021-12-16 | Discharge: 2021-12-16 | Disposition: A | Discharge status: Home / Self Care | Payer: Medicare Other | Source: Ambulatory Visit | Attending: Family Medicine | Admitting: Family Medicine

## 2021-12-16 ENCOUNTER — Other Ambulatory Visit (HOSPITAL_COMMUNITY)
Admission: RE | Admit: 2021-12-16 | Discharge: 2021-12-16 | Disposition: A | Discharge status: Home / Self Care | Payer: Medicare Other | Source: Ambulatory Visit | Attending: Family Medicine | Admitting: Family Medicine

## 2021-12-16 VITALS — BP 146/80 | HR 73 | Temp 98.8°F

## 2021-12-16 DIAGNOSIS — S0990XA Unspecified injury of head, initial encounter: Secondary | ICD-10-CM | POA: Insufficient documentation

## 2021-12-16 DIAGNOSIS — S2241XA Multiple fractures of ribs, right side, initial encounter for closed fracture: Secondary | ICD-10-CM | POA: Diagnosis not present

## 2021-12-16 DIAGNOSIS — K6389 Other specified diseases of intestine: Secondary | ICD-10-CM | POA: Diagnosis not present

## 2021-12-16 DIAGNOSIS — R112 Nausea with vomiting, unspecified: Secondary | ICD-10-CM

## 2021-12-16 DIAGNOSIS — I517 Cardiomegaly: Secondary | ICD-10-CM | POA: Diagnosis not present

## 2021-12-16 LAB — COMPREHENSIVE METABOLIC PANEL
ALT: 17 U/L (ref 0–44)
AST: 22 U/L (ref 15–41)
Albumin: 4.4 g/dL (ref 3.5–5.0)
Alkaline Phosphatase: 72 U/L (ref 38–126)
Anion gap: 9 (ref 5–15)
BUN: 31 mg/dL — ABNORMAL HIGH (ref 8–23)
CO2: 26 mmol/L (ref 22–32)
Calcium: 9.5 mg/dL (ref 8.9–10.3)
Chloride: 103 mmol/L (ref 98–111)
Creatinine, Ser: 1.59 mg/dL — ABNORMAL HIGH (ref 0.44–1.00)
GFR, Estimated: 30 mL/min — ABNORMAL LOW (ref >60)
Glucose, Bld: 123 mg/dL — ABNORMAL HIGH (ref 70–99)
Potassium: 4.7 mmol/L (ref 3.5–5.1)
Sodium: 138 mmol/L (ref 135–145)
Total Bilirubin: 0.8 mg/dL (ref 0.3–1.2)
Total Protein: 7.3 g/dL (ref 6.5–8.1)

## 2021-12-16 LAB — CBC
HCT: 36.3 % (ref 36.0–46.0)
Hemoglobin: 11.9 g/dL — ABNORMAL LOW (ref 12.0–15.0)
MCH: 30 pg (ref 26.0–34.0)
MCHC: 32.8 g/dL (ref 30.0–36.0)
MCV: 91.4 fL (ref 80.0–100.0)
Platelets: 284 10*3/uL (ref 150–400)
RBC: 3.97 MIL/uL (ref 3.87–5.11)
RDW: 12.3 % (ref 11.5–15.5)
WBC: 11.6 10*3/uL — ABNORMAL HIGH (ref 4.0–10.5)
nRBC: 0 % (ref 0.0–0.2)

## 2021-12-16 NOTE — Assessment & Plan Note (Signed)
Needs stat labs for further evaluation today.  Given her history of bowel obstruction, her PCP recommended acute abdominal series.  Sending to the hospital for labs and imaging.

## 2021-12-16 NOTE — Progress Notes (Addendum)
Subjective:  Patient ID: [name], female    DOB: 1924/02/14  Age: 88 y.o. MRN: [id number]  CC: Chief Complaint  Patient presents with   Emesis    Several times start in a row. Patient says she doesn't feel good   Fall    Patient fell on Monday and started feeling bad. Patient says she hit her bottom on floor. EMS came and checked pt out, everything was ok.    HPI:    88 year old female presents for evaluation of the above.  Patient suffered a fall at home on Monday.  She fell while using her walker.  She states that she landed sideways.  She struck her head on the couch.  Since her fall on Monday she has not been feeling well.  She has had some nausea and has had several episodes of vomiting today.  Of note, she was evaluated by EMS and refused to go to the hospital on Monday.  Patient has a prior history of subarachnoid hemorrhage as well as intracranial hemorrhage.  She reports pain in the low back as well as the knees.  Patient states that she feels awful.  Additionally, patient has had a history of small bowel obstruction.  Patient Active Problem List   Diagnosis Date Noted   Head trauma 12/16/2021   Acute gout due to renal impairment involving foot 08/09/2021   AKI (acute kidney injury) on stage 3b CKD  08/08/2021   SBO (small bowel obstruction) (HCC) 11/07/2019   Nausea and vomiting 11/07/2019   Right hip pain 08/01/2019   HLD (hyperlipidemia) 07/18/2017   B12 deficiency 07/18/2017   SAH (subarachnoid hemorrhage) ([facility]) 07/17/2017   ICH (intracerebral hemorrhage) ([facility]) 07/16/2017   Cervical disc disease 05/09/2017   Hypothyroid 05/09/2017   Atrial fibrillation ([facility]) 01/12/2016   Osteoarthritis of right knee 12/15/2015   Major depression 09/22/2015   Ventral hernia 05/21/2015   Ovarian tumor 04/13/2015   Squamous cell skin cancer 08/13/2014   Impaired fasting glucose 08/05/2013   Neuropathy 01/11/2013   Anxiety 01/29/2012   GERD (gastroesophageal reflux  disease) 01/29/2012   Sciatica of right side 05/10/2011    Social Hx   Social History   Socioeconomic History   Marital status: Widowed    Spouse name: Not on file   Number of children: 1   Years of education: Not on file   Highest education level: Not on file  Occupational History    Employer: RETIRED  Tobacco Use   Smoking status: Never   Smokeless tobacco: Never  Vaping Use   Vaping Use: Never used  Substance and Sexual Activity   Alcohol use: No    Alcohol/week: 0.0 standard drinks of alcohol   Drug use: No   Sexual activity: Never  Other Topics Concern   Not on file  Social History Narrative   Not on file   Social Determinants of Health   Financial Resource Strain: Low Risk  (09/21/2021)   Overall Financial Resource Strain (CARDIA)    Difficulty of Paying Living Expenses: Not hard at all  Food Insecurity: No Food Insecurity (09/21/2021)   Hunger Vital Sign    Worried About Running Out of Food in the Last Year: Never true    [place] in the Last Year: Never true  Transportation Needs: No Transportation Needs (09/21/2021)   PRAPARE - Hydrologist (Medical): No    Lack of Transportation (Non-Medical): No  Physical Activity: Inactive (  09/21/2021)   Exercise Vital Sign    Days of Exercise per Week: 0 days    Minutes of Exercise per Session: 0 min  Stress: Stress Concern Present (09/21/2021)   [place]    Feeling of Stress : To some extent  Social Connections: Moderately Isolated (09/21/2021)   Social Connection and Isolation Panel [NHANES]    Frequency of Communication with Friends and Family: More than three times a week    Frequency of Social Gatherings with Friends and Family: More than three times a week    Attends Religious Services: 1 to 4 times per year    Active Member of [employer] or Organizations: No    Attends Archivist Meetings: Never    Marital  Status: Widowed    Review of Systems Per HPI  Objective:  BP (!) 146/80   Pulse 73   Temp 98.8 F (37.1 C) (Oral)   SpO2 95%      12/16/2021    4:05 PM 11/29/2021   12:13 PM 11/29/2021   11:16 AM  BP/Weight  Systolic BP 557 322 025  Diastolic BP 80 72 72    Physical Exam Vitals and nursing note reviewed.  Constitutional:      General: She is not in acute distress. HENT:     Head:     Comments: No appreciable hematoma. Eyes:     General:        Right eye: No discharge.        Left eye: No discharge.     Conjunctiva/sclera: Conjunctivae normal.     Pupils: Pupils are equal, round, and reactive to light.  Cardiovascular:     Rate and Rhythm: Normal rate and regular rhythm.  Pulmonary:     Effort: Pulmonary effort is normal. No respiratory distress.  Neurological:     Mental Status: She is alert.     Comments: No appreciable focal deficits.  Caregiver states that she seems to be at her baseline.  Psychiatric:        Mood and Affect: Mood normal.        Behavior: Behavior normal.     Lab Results  Component Value Date   WBC 11.6 (H) 12/16/2021   HGB 11.9 (L) 12/16/2021   HCT 36.3 12/16/2021   PLT 284 12/16/2021   GLUCOSE 80 11/29/2021   CHOL 185 07/18/2017   TRIG 139 07/18/2017   HDL 72 07/18/2017   LDLCALC 85 07/18/2017   ALT 14 11/05/2021   AST 21 11/05/2021   NA 141 11/29/2021   K 4.6 11/29/2021   CL 102 11/29/2021   CREATININE 1.50 (H) 11/29/2021   BUN 24 11/29/2021   CO2 23 11/29/2021   TSH 1.200 07/22/2021   INR 2.28 07/16/2017   HGBA1C 6.0 (H) 11/06/2019     Assessment & Plan:   Problem List Items Addressed This Visit       Digestive   Nausea and vomiting    Needs stat labs for further evaluation today.  Given her history of bowel obstruction, her PCP recommended acute abdominal series.  Sending to the hospital for labs and imaging.      Relevant Orders   CT HEAD WO CONTRAST (5MM)   CBC   Comprehensive metabolic panel   DG Abd  Acute W/Chest     Other   Head trauma - Primary    Given recent fall and now development of nausea and vomiting, I am  concerned about intracranial bleed.  She has had this in the past.  Needs stat CT to further evaluate.       Relevant Orders   CT HEAD WO CONTRAST (5MM)   [place]

## 2021-12-16 NOTE — Assessment & Plan Note (Signed)
Given recent fall and now development of nausea and vomiting, I am concerned about intracranial bleed.  She has had this in the past.  Needs stat CT to further evaluate.

## 2021-12-17 ENCOUNTER — Inpatient Hospital Stay (HOSPITAL_COMMUNITY): Admission: RE | Admit: 2021-12-17 | Payer: Medicare Other | Source: Ambulatory Visit

## 2021-12-17 MED ORDER — ONDANSETRON HCL 4 MG PO TABS: 4.0000 mg | ORAL_TABLET | Freq: Three times a day (TID) | ORAL | 0 refills | Status: DC | PRN

## 2021-12-17 NOTE — Addendum Note (Signed)
Addended by: Coral Spikes on: 12/17/2021 09:55 AM   Modules accepted: Orders

## 2021-12-29 ENCOUNTER — Ambulatory Visit (INDEPENDENT_AMBULATORY_CARE_PROVIDER_SITE_OTHER): Payer: Medicare Other | Admitting: Family Medicine

## 2021-12-29 VITALS — BP 118/64 | HR 59 | Temp 97.7°F | Ht 64.0 in | Wt 142.0 lb

## 2021-12-29 DIAGNOSIS — N390 Urinary tract infection, site not specified: Secondary | ICD-10-CM

## 2021-12-29 MED ORDER — DOXYCYCLINE HYCLATE 100 MG PO TABS: 100.0000 mg | ORAL_TABLET | Freq: Two times a day (BID) | ORAL | 0 refills | Status: DC

## 2021-12-29 NOTE — Progress Notes (Signed)
   Subjective:    Patient ID: [name], female    DOB: 23-Aug-1923, 88 y.o.   MRN: [id number]  HPI Re-ocurrent UTI  C/o some burning and frequency Patient with intermittent urinary tract symptoms. Denies any falls.  States her moods are doing okay She finds himself feeling tired and fatigued Does have some underlying chronic kidney disease Review of Systems     Objective:   Physical Exam  General-in no acute distress Eyes-no discharge Lungs-respiratory rate normal, CTA CV-no murmurs,RRR Extremities skin warm dry no edema Neuro grossly normal Behavior normal, alert  Patient uses her walker to get around     Assessment & Plan:  Recurrent UTI This is a conundrum I encouraged the patient to do the best she can with taking in liquids and frequent urination Being on an antibiotic as a suppressive measure is not a good idea because of frequent side effects of antibiotics at her age We will treat episodically Doxycycline today  She is not having any reoccurrence of her intermittent abdominal discomfort currently we will watch this closely  No sign of partial bowel obstruction Follow-up again in approximately 3 months sooner if any problems  Fall prevention discussed use her walker Important to keep weight steady

## 2021-12-29 NOTE — Patient Instructions (Signed)
Hi [name] We sent in doxycycline for your urinary tract 1 taken twice daily with a small snack and a tall glass of water  I will see you back in 3 months but if you are having problems before then please let us know  TakeCare-Dr. Nicki Reaper

## 2022-01-03 ENCOUNTER — Other Ambulatory Visit: Payer: Self-pay | Admitting: Family Medicine

## 2022-01-03 NOTE — Telephone Encounter (Signed)
6 months on all of these

## 2022-01-05 ENCOUNTER — Ambulatory Visit (INDEPENDENT_AMBULATORY_CARE_PROVIDER_SITE_OTHER): Payer: Medicare Other

## 2022-01-05 DIAGNOSIS — E785 Hyperlipidemia, unspecified: Secondary | ICD-10-CM

## 2022-01-05 DIAGNOSIS — I4811 Longstanding persistent atrial fibrillation: Secondary | ICD-10-CM

## 2022-01-05 NOTE — Patient Instructions (Signed)
Visit Information  Thank you for taking time to visit with me today. Please don't hesitate to contact me if I can be of assistance to you before our next scheduled telephone appointment.  Following are the goals we discussed today:  Take medications as prescribed   Attend all scheduled provider appointments Call pharmacy for medication refills 3-7 days in advance of running out of medications Call provider office for new concerns or questions  - check pulse (heart) rate before taking medicine - make a plan to eat healthy - keep all lab appointments - take medicine as prescribed - call for medicine refill 2 or 3 days before it runs out - take all medications exactly as prescribed - call doctor with any symptoms you believe are related to your medicine - call doctor when you experience any new symptoms - go to all doctor appointments as scheduled - adhere to prescribed diet: heart healthy Drink enough fluids especially when you are sick For prevention of urinary tract infection, please drink water, wipe from front to back, do not wear tight clothing or take bubble baths Bake or broil foods instead of frying Avoid trans/ saturated fats Limit fast food Expect a call from nurse and social worker for care coordination, case closed for CCM services fall prevention strategies: change position slowly, use assistive device such as walker or cane (per provider recommendations) when walking, keep walkways clear, have good lighting in room. It is important to contact your provider if you have any falls, maintain muscle strength/tone by exercise per provider recommendations.  Please call the care guide team at [phone] if you need to cancel or reschedule your appointment.   If you are experiencing a Mental Health or [place] or need someone to talk to, please call the Suicide and Crisis Lifeline: 988 call the [place] National Suicide Prevention Lifeline: 332 092 9271 or TTY:  504-552-3667 TTY (623)061-4793) to talk to a trained counselor call 1-800-273-TALK (toll free, 24 hour hotline) go to [facility] [address], [place] [phone]) call the [place]: [phone] call 911   Patient verbalizes understanding of instructions and care plan provided today and agrees to view in [place]. Active MyChart status and patient understanding of how to access instructions and care plan via MyChart confirmed with patient.     Jacqlyn Larsen [facility], BSN RN Case Manager [facility] [phone]

## 2022-01-05 NOTE — Chronic Care Management (AMB) (Signed)
Chronic Care Management   CCM RN Visit Note  01/05/2022 Name: [name] MRN: [id number] DOB: 26-Feb-1924  Subjective: [name] is a 88 y.o. year old female who is a primary care patient of Luking, Elayne Snare, MD. The care management team was consulted for assistance with disease management and care coordination needs.    Engaged with patient by telephone for follow up visit in response to provider referral for case management and/or care coordination services.   Consent to Services:  The patient was given information about Chronic Care Management services, agreed to services, and gave verbal consent prior to initiation of services.  Please see initial visit note for detailed documentation.   Patient agreed to services and verbal consent obtained.   Assessment: Review of patient past medical history, allergies, medications, health status, including review of consultants reports, laboratory and other test data, was performed as part of comprehensive evaluation and provision of chronic care management services.   SDOH (Social Determinants of Health) assessments and interventions performed:    CCM Care Plan  Allergies  Allergen Reactions   Amiodarone Itching   Cephalosporins Itching   Meloxicam Itching   Robaxin [Methocarbamol] Itching   Amoxicillin Hives   Lovenox [Enoxaparin Sodium] Nausea And Vomiting   Morphine Other (See Comments)    Unknown   Morphine And Related Itching   Penicillins Swelling        Sulfa Antibiotics Swelling   Lipitor [Atorvastatin] Other (See Comments)    Unknown:patient is not aware of this allergy   Lorazepam Other (See Comments)    Exceptional fatigue    Macrobid [Nitrofurantoin] Other (See Comments)    Constipation    Outpatient Encounter Medications as of 01/05/2022  Medication Sig   allopurinol (ZYLOPRIM) 100 MG tablet Take 0.5 tablets (50 mg total) by mouth daily.   carbamide peroxide (DEBROX) 6.5 % OTIC solution Place 5 drops into  both ears 2 (two) times daily as needed (as needed for ear wax build up).   diltiazem (CARDIZEM CD) 120 MG 24 hr capsule Take 1 capsule (120 mg total) by mouth daily.   doxycycline (VIBRA-TABS) 100 MG tablet Take 1 tablet (100 mg total) by mouth 2 (two) times daily.   HYDROcodone-acetaminophen (NORCO/VICODIN) 5-325 MG tablet Take 1 tablet by mouth every 6 (six) hours as needed for moderate pain.   levothyroxine (SYNTHROID) 75 MCG tablet TAKE ONE TABLET BY MOUTH ONCE DAILY. (Patient taking differently: Take 75 mcg by mouth daily before breakfast.)   lisinopril (ZESTRIL) 2.5 MG tablet Take 1 tablet (2.5 mg total) by mouth daily.   metoprolol succinate (TOPROL-XL) 50 MG 24 hr tablet TAKE 1 TABLET BY MOUTH DAILY. TAKE WITH OR IMMEDIATELY FOLLOWING A MEAL.   Multiple Vitamin (MULTIVITAMIN WITH MINERALS) TABS tablet Take 1 tablet by mouth daily.   ondansetron (ZOFRAN) 4 MG tablet Take 1 tablet (4 mg total) by mouth every 8 (eight) hours as needed for nausea or vomiting.   ondansetron (ZOFRAN-ODT) 4 MG disintegrating tablet Take 1 tablet (4 mg total) by mouth every 8 (eight) hours as needed for up to 10 doses for nausea or vomiting.   oxymetazoline (AFRIN) 0.05 % nasal spray Place 1 spray into both nostrils 2 (two) times daily.   pantoprazole (PROTONIX) 40 MG tablet TAKE 1 TABLET BY MOUTH ONCE DAILY.   polyethylene glycol powder (GLYCOLAX/MIRALAX) 17 GM/SCOOP powder Take 17 g by mouth daily.   potassium chloride (KLOR-CON) 10 MEQ tablet TAKE ONE TABLET BY MOUTH ONCE DAILY.  Propylene Glycol (SYSTANE COMPLETE OP) Apply 1 drop to eye daily as needed (dryness).   QC ACETAMINOPHEN 8 HOURS 650 MG CR tablet Take 1 tablet by mouth in the morning and at bedtime.   sertraline (ZOLOFT) 50 MG tablet Take 1 tablet (50 mg total) by mouth daily.   torsemide (DEMADEX) 20 MG tablet TAKE (1) TABLET BY MOUTH EVERY OTHER DAY.   trolamine salicylate (ASPERCREME) 10 % cream Apply 1 application topically as needed (feet and  shoulders).   vitamin B-12 1000 MCG tablet Take 1 tablet (1,000 mcg total) by mouth daily.   No facility-administered encounter medications on file as of 01/05/2022.    Patient Active Problem List   Diagnosis Date Noted   Head trauma 12/16/2021   Acute gout due to renal impairment involving foot 08/09/2021   AKI (acute kidney injury) on stage 3b CKD  08/08/2021   SBO (small bowel obstruction) (HCC) 11/07/2019   Nausea and vomiting 11/07/2019   Right hip pain 08/01/2019   HLD (hyperlipidemia) 07/18/2017   B12 deficiency 07/18/2017   SAH (subarachnoid hemorrhage) ([place]) 07/17/2017   ICH (intracerebral hemorrhage) ([place]) 07/16/2017   Cervical disc disease 05/09/2017   Hypothyroid 05/09/2017   Atrial fibrillation ([place]) 01/12/2016   Osteoarthritis of right knee 12/15/2015   Major depression 09/22/2015   Ventral hernia 05/21/2015   Ovarian tumor 04/13/2015   Squamous cell skin cancer 08/13/2014   Impaired fasting glucose 08/05/2013   Neuropathy 01/11/2013   Anxiety 01/29/2012   GERD (gastroesophageal reflux disease) 01/29/2012   Sciatica of right side 05/10/2011    Conditions to be addressed/monitored:Atrial Fibrillation and HLD  Care Plan : RN Care Manager Plan of Care  Updates made by Kassie Mends, RN since 01/05/2022 12:00 AM  Completed 01/05/2022   Problem: No plan of care established for management of chronic disease state  (Atrial fibrillation, HLD) Resolved 01/05/2022  Priority: High     Long-Range Goal: Development of plan of care for chronic disease management  (Atrial fibrillation, HLD) Completed 01/05/2022  Start Date: 09/01/2021  Expected End Date: 02/28/2022  Priority: High  Note:   Current Barriers:  Knowledge Deficits related to plan of care for management of Atrial Fibrillation and HLD  Spoke with patient who reports she lives alone, her adult son checks in on her frequently, has hired caregiver to assist with grocery shopping, washing clothes etc every  Wednesday, pt has walker that she uses at all times, has Life Alert necklace, has had one fall this past year, pt is unable to exercise due to chronic pain but does walk through her house as much as she can. Patient reports she has all medications and takes as prescribed. Update- patient reports she has nausea today with several episodes of vomiting, has been able to keep small amounts of ginger ale down, nausea has been worse over the past few days and zofran has not helped, pt denies fever, had normal bowel movement yesterday and has diarrhea today. 01/05/22- spoke with patient who reports she has felt depressed lately, states " due to my health issues and I can't do the things I want to do", pt requests social worker outreach her for assistance, resources for depression PHQ-9=5 although score is minimal pt would like outreach from Education officer, museum and would like to continue in care coordination program to have nurse outreach, pt feels due to her advanced age the service is needed and helpful, pt reports she had a fall and hit her head but it was  on a soft couch and states " I wasn't hurt from it, EMS checked me and my doctor", pt continues to have recurrent UTI's and finished antibiotic today.  Pt states she had recent CT scan.  RNCM Clinical Goal(s):  Patient will verbalize understanding of plan for management of Atrial Fibrillation and HLD as evidenced by patient report, review of EHR and  through collaboration with RN Care manager, provider, and care team.   Interventions: 1:1 collaboration with primary care provider regarding development and update of comprehensive plan of care as evidenced by provider attestation and co-signature Inter-disciplinary care team collaboration (see longitudinal plan of care) Evaluation of current treatment plan related to  self management and patient's adherence to plan as established by provider   AFIB Interventions: (Status:  New goal. and Goal on track:  Yes.) Long  Term Goal   Counseled on importance of regular laboratory monitoring as prescribed Afib action plan reviewed Reviewed importance of knowing how you feel every day and calling doctor early on for change in health status, symptoms  Reinforced energy conservation Encouraged patient to drink mostly water Reviewed plan of care with patient including transfer to care coordination services for nurse and social worker and case closure for CCM services Referral placed for LCSW  Hyperlipidemia:  (Status: New goal. Goal on Track (progressing): YES.) Long Term Goal  Lab Results  Component Value Date   CHOL 185 07/18/2017   HDL 72 07/18/2017   [place] 85 07/18/2017   TRIG 139 07/18/2017   CHOLHDL 2.6 07/18/2017  Medication review performed; medication list updated in electronic medical record.  Provider established cholesterol goals reviewed; Counseled on importance of regular laboratory monitoring as prescribed; Reviewed importance of limiting foods high in cholesterol; Reinforced importance of staying well hydrated during sickness Reviewed signs/ symptoms UTI and prevention Reinforced heart healthy diet Safety precautions reviewed  Patient Goals/Self-Care Activities: Take medications as prescribed   Attend all scheduled provider appointments Call pharmacy for medication refills 3-7 days in advance of running out of medications Call provider office for new concerns or questions  - check pulse (heart) rate before taking medicine - make a plan to eat healthy - keep all lab appointments - take medicine as prescribed - call for medicine refill 2 or 3 days before it runs out - take all medications exactly as prescribed - call doctor with any symptoms you believe are related to your medicine - call doctor when you experience any new symptoms - go to all doctor appointments as scheduled - adhere to prescribed diet: heart healthy Drink enough fluids especially when you are sick For prevention  of urinary tract infection, please drink water, wipe from front to back, do not wear tight clothing or take bubble baths Bake or broil foods instead of frying Avoid trans/ saturated fats Limit fast food Expect a call from nurse and social worker for care coordination, case closed for CCM services fall prevention strategies: change position slowly, use assistive device such as walker or cane (per provider recommendations) when walking, keep walkways clear, have good lighting in room. It is important to contact your provider if you have any falls, maintain muscle strength/tone by exercise per provider recommendations.       Plan:Telephone follow up appointment with care management team member scheduled for:  care coordination nurse will call you on 02/16/22 at 3 pm,  social worker will outreach you- a referral has been placed.  Jacqlyn Larsen [facility], BSN RN Case Manager [facility] [phone]

## 2022-01-06 ENCOUNTER — Telehealth: Payer: Self-pay | Admitting: *Deleted

## 2022-01-06 NOTE — Chronic Care Management (AMB) (Signed)
  Care Coordination  Outreach Note  01/06/2022 Name: DILIA ALEMANY MRN: [id number] DOB: 09/21/1923   Care Coordination Outreach Attempts  An unsuccessful telephone outreach was attempted today to offer the patient information about available care coordination services as a benefit of their health plan.   Follow Up Plan:  Additional outreach attempts will be made to offer the patient care coordination information and services.   Encounter Outcome:  No Answer  [place]  Direct Dial: [phone]

## 2022-01-10 ENCOUNTER — Other Ambulatory Visit: Payer: Self-pay | Admitting: Family Medicine

## 2022-01-12 NOTE — Chronic Care Management (AMB) (Signed)
  Care Coordination   Note   01/12/2022 Name: CHIQUITA HECKERT MRN: [id number] DOB: 03/30/1924  [name] is a 88 y.o. year old female who sees Luking, Elayne Snare, MD for primary care. I reached out to [employer] by phone today to offer care coordination services.  Ms. Amore was given information about Care Coordination services today including:   The Care Coordination services include support from the care team which includes your Nurse Coordinator, Clinical Social Worker, or Pharmacist.  The Care Coordination team is here to help remove barriers to the health concerns and goals most important to you. Care Coordination services are voluntary, and the patient may decline or stop services at any time by request to their care team member.   Care Coordination Consent Status: Patient agreed to services and verbal consent obtained.   Follow up plan:  Telephone appointment with care coordination team member scheduled for:  02/02/22 and 02/16/22  Encounter Outcome:  Pt. Scheduled

## 2022-01-20 DIAGNOSIS — I4891 Unspecified atrial fibrillation: Secondary | ICD-10-CM | POA: Diagnosis not present

## 2022-01-20 DIAGNOSIS — E785 Hyperlipidemia, unspecified: Secondary | ICD-10-CM | POA: Diagnosis not present

## 2022-02-02 ENCOUNTER — Ambulatory Visit: Payer: Self-pay | Admitting: *Deleted

## 2022-02-02 ENCOUNTER — Other Ambulatory Visit: Payer: Self-pay | Admitting: Family Medicine

## 2022-02-02 ENCOUNTER — Encounter: Payer: Self-pay | Admitting: *Deleted

## 2022-02-02 NOTE — Patient Instructions (Signed)
Visit Information  Thank you for taking time to visit with me today. Please don't hesitate to contact me if I can be of assistance to you.   Following are the goals we discussed today:   Goals Addressed               This Visit's Progress     Reduce and Manage Symptoms of Anxiety and Depression. (pt-stated)   On track     Care Coordination Interventions:  PHQ2/PHQ9 Depression Screen Completed & Results Reviewed.   Suicidal Ideation/Homicidal Ideation Assessed - None Present. Solution-Focused Strategies Employed. Deep Breathing Exercises, Relaxation Techniques & Mindfulness Meditation Strategies Taught & Encouraged Daily.   Active Listening/Reflection Utilized.  Emotional Support Provided. Verbalization of Feelings Encouraged.  Problem Solving/Task-Centered Solutions Developed.   Provided Psychoeducation for Mental Health Concerns. Quality of Sleep Assessed & Sleep Hygiene Techniques Promoted. Participation in Counseling Encouraged. Discussed Referral to Psychiatrist for Psychotropic Medication Management. Discussed Referral to Therapist for Psychotherapeutic Counseling Services.         Our next appointment is by telephone on 02/16/2022 at 9:30 am.  Please call the care guide team at [phone] if you need to cancel or reschedule your appointment.   If you are experiencing a Mental Health or [place] or need someone to talk to, please call the Suicide and Crisis Lifeline: 988 call the [place] National Suicide Prevention Lifeline: 7320053570 or TTY: (334)782-8808 TTY 825 705 8249) to talk to a trained counselor call 1-800-273-TALK (toll free, 24 hour hotline) go to [facility] [address], [place] [phone]) call the [place]: [phone] call 911  Patient verbalizes understanding of instructions and care plan provided today and agrees to view in [place]. Active MyChart status and  patient understanding of how to access instructions and care plan via MyChart confirmed with patient.     Telephone follow up appointment with care management team member scheduled for:  02/16/2022 at 9:30 am.  Nat Christen, BSW, MSW, [place]  Licensed Clinical Social Worker  [place]  Mailing [place]. [address], [place], [place] [address] Physical Address-[address] [place], [place] [address] Toll Free Main # [phone] Fax # [phone] Cell # [phone] [email or web address]

## 2022-02-02 NOTE — Patient Outreach (Signed)
  Care Coordination   Initial Visit Note   02/02/2022  Name: [name] MRN: [id number] DOB: Jul 21, 1923  [name] is a 88 y.o. year old female who sees Luking, Elayne Snare, MD for primary care. I spoke with [name] by phone today.  What matters to the patients health and wellness today?  Reduce and Manage Symptoms of Anxiety and Depression.    Goals Addressed               This Visit's Progress     Reduce and Manage Symptoms of Anxiety and Depression. (pt-stated)   On track     Care Coordination Interventions:  PHQ2/PHQ9 Depression Screen Completed & Results Reviewed.   Suicidal Ideation/Homicidal Ideation Assessed - None Present. Solution-Focused Strategies Employed. Deep Breathing Exercises, Relaxation Techniques & Mindfulness Meditation Strategies Taught & Encouraged Daily.   Active Listening/Reflection Utilized.  Emotional Support Provided. Verbalization of Feelings Encouraged.  Problem Solving/Task-Centered Solutions Developed.   Provided Psychoeducation for Mental Health Concerns. Quality of Sleep Assessed & Sleep Hygiene Techniques Promoted. Participation in Counseling Encouraged. Discussed Referral to Psychiatrist for Psychotropic Medication Management. Discussed Referral to Therapist for Psychotherapeutic Counseling Services.         SDOH assessments and interventions completed:  Yes.   SDOH Interventions Today    Flowsheet Row Most Recent Value  SDOH Interventions   Food Insecurity Interventions Intervention Not Indicated  Housing Interventions Intervention Not Indicated  Transportation Interventions Intervention Not Indicated  Utilities Interventions Intervention Not Indicated  Alcohol Usage Interventions Intervention Not Indicated (Score <7)  Depression Interventions/Treatment  Counseling, Patient refuses Treatment  Financial Strain Interventions Intervention Not Indicated  Physical Activity Interventions Patient Refused  Stress  Interventions Offered [employer] Resources, Provide Counseling, Patient Refused  Social Connections Interventions Patient Refused        Care Coordination Interventions Activated:  Yes.   Care Coordination Interventions:  Yes, provided.   Follow up plan: Follow up call scheduled for 02/16/2022 at 9:30 am.  Encounter Outcome:  Pt. Visit Completed.   Nat Christen, BSW, MSW, LCSW  Licensed Education officer, [facility]  Mailing [place] [address] [place], [place] [address] Physical Address-[address], [place], [place] [address] Toll Free Main # [phone] Fax # [phone] Cell # [phone] [name]

## 2022-02-10 ENCOUNTER — Other Ambulatory Visit: Payer: Self-pay | Admitting: Family Medicine

## 2022-02-14 DIAGNOSIS — M79671 Pain in right foot: Secondary | ICD-10-CM | POA: Diagnosis not present

## 2022-02-14 DIAGNOSIS — L609 Nail disorder, unspecified: Secondary | ICD-10-CM | POA: Diagnosis not present

## 2022-02-14 DIAGNOSIS — I739 Peripheral vascular disease, unspecified: Secondary | ICD-10-CM | POA: Diagnosis not present

## 2022-02-14 DIAGNOSIS — L110 Acquired keratosis follicularis: Secondary | ICD-10-CM | POA: Diagnosis not present

## 2022-02-16 ENCOUNTER — Encounter: Payer: Self-pay | Admitting: *Deleted

## 2022-02-16 ENCOUNTER — Ambulatory Visit: Payer: Self-pay | Admitting: *Deleted

## 2022-02-16 NOTE — Patient Instructions (Signed)
Visit Information  Thank you for taking time to visit with me today. Please don't hesitate to contact me if I can be of assistance to you.   Following are the goals we discussed today:   Goals Addressed               This Visit's Progress     Reduce and Manage Symptoms of Anxiety and Depression. (pt-stated)   On track     Care Coordination Interventions:  Solution-Focused Strategies Employed. Deep Breathing Exercises, Relaxation Techniques & Mindfulness Meditation Strategies Reviewed & Encouraged Daily.   Active Listening/Reflection Utilized.  Emotional Support Provided. Verbalization of Feelings Encouraged.  Problem Solving/Task-Centered Solutions Developed.   Participation in Counseling Encouraged. Continued Discussion Regarding Referral to Psychiatrist for Psychotropic Medication Management. Continued Discussion Regarding Referral to Therapist for Psychotherapeutic Counseling Services. CSW Collaboration with Primary Care Provider, Dr. Sallee Lange to Report Continued Symptoms of Urinary Tract Infection. Patient is Requesting Primary Care Provider, Dr. Sallee Lange Call in Antibiotics to Treat Symptoms of Urinary Tract Infection. CSW Collaboration with Primary Care Provider, Dr. Sallee Lange to Report Lower Extremity Back Pain.  ~Could Lower Extremity Back Pain Be a Symptom of Urinary Tract Infection?  ~ Patient Admitted to Being "Desperate for Relief".         Our next appointment is by telephone on 03/03/2022 at 9:30 am.  Please call the care guide team at [phone] if you need to cancel or reschedule your appointment.   If you are experiencing a Mental Health or [place] or need someone to talk to, please call the Suicide and Crisis Lifeline: 988 call the [place] National Suicide Prevention Lifeline: (507)592-1471 or TTY: 941-365-1657 TTY (323) 732-8395) to talk to a trained counselor call 1-800-273-TALK (toll free, 24 hour hotline) go to [facility] [address], [place] [phone]) call the [place]: [phone] call 911  Patient verbalizes understanding of instructions and care plan provided today and agrees to view in [place]. Active MyChart status and patient understanding of how to access instructions and care plan via MyChart confirmed with patient.     Telephone follow up appointment with care management team member scheduled for:  03/03/2022 at 9:30 am.  Nat Christen, BSW, MSW, [place]  Licensed Clinical Social Worker  [place]  Mailing [place]. [address] [place], [place] [address] Physical Address-[address], [place], [place] [address] Toll Free Main # [phone] Fax # [phone] Cell # [phone] [email or web address]

## 2022-02-16 NOTE — Patient Instructions (Addendum)
Visit Information  Thank you for taking time to visit with me today. Please don't hesitate to contact me if I can be of assistance to you.   Following are the goals we discussed today:   Goals Addressed               This Visit's Progress     Patient Stated     Manage recurrent urinary infections (THN) (pt-stated)   Not on track     Care Coordination Interventions: Assessment of understanding of urinary tract infection diagnosis Inquired if she has a specialist/urologist- discuss urology services Assessed frequency After patient permission outreach completed via secure chat to her pcp Sent online education on urinary tract infection, adult via her my chart.        Our next appointment is by telephone on 03/02/22 at 1:30 pm  Please call the care guide team at [phone] if you need to cancel or reschedule your appointment.   If you are experiencing a Mental Health or [place] or need someone to talk to, please call the Suicide and Crisis Lifeline: 988 call the [place] National Suicide Prevention Lifeline: (775)269-2735 or TTY: (980)578-8723 TTY 605 020 7434) to talk to a trained counselor call 1-800-273-TALK (toll free, 24 hour hotline) call the [facility]: ([phone] call 911   Patient verbalizes understanding of instructions and care plan provided today and agrees to view in [place]. Active MyChart status and patient understanding of how to access instructions and care plan via MyChart confirmed with patient.     The patient has been provided with contact information for the care management team and has been advised to call with any health related questions or concerns.   [place] Lavina Hamman, RN, BSN, [place] Coordinator Office number [phone]

## 2022-02-16 NOTE — Patient Outreach (Signed)
  Care Coordination   Follow Up Visit Note   02/16/2022  Name: [name] MRN: [id number] DOB: [birth year]  [name] is a 88 y.o. year old female who sees Luking, Elayne Snare, MD for primary care. I spoke with [name] by phone today.  What matters to the patients health and wellness today?  Reduce and Manage Symptoms of Anxiety and Depression.    Goals Addressed               This Visit's Progress     Reduce and Manage Symptoms of Anxiety and Depression. (pt-stated)   On track     Care Coordination Interventions:  Solution-Focused Strategies Employed. Deep Breathing Exercises, Relaxation Techniques & Mindfulness Meditation Strategies Reviewed & Encouraged Daily.   Active Listening/Reflection Utilized.  Emotional Support Provided. Verbalization of Feelings Encouraged.  Problem Solving/Task-Centered Solutions Developed.   Participation in Counseling Encouraged. Continued Discussion Regarding Referral to Psychiatrist for Psychotropic Medication Management. Continued Discussion Regarding Referral to Therapist for Psychotherapeutic Counseling Services. CSW Collaboration with Primary Care Provider, Dr. Sallee Lange to Report Continued Symptoms of Urinary Tract Infection. Patient is Requesting Primary Care Provider, Dr. Sallee Lange Call in Antibiotics to Treat Symptoms of Urinary Tract Infection. CSW Collaboration with Primary Care Provider, Dr. Sallee Lange to Report Lower Extremity Back Pain.  ~Could Lower Extremity Back Pain Be a Symptom of Urinary Tract Infection?  ~ Patient Admitted to Being "Desperate for Relief".         SDOH assessments and interventions completed:  Yes.  Care Coordination Interventions Activated:  Yes.   Care Coordination Interventions:  Yes, provided.   Follow up plan: Follow up call scheduled for 03/03/2022 at 9:30 am.  Encounter Outcome:  Pt. Visit Completed.   [name], [name], [name], [name]  Licensed Regulatory affairs officer Health System  Mailing [place] [address] [place], [place] [address] Physical Address-[address], [place], [place] [address] Toll Free Main # [phone] Fax # [phone] Cell # [phone] [email or web address]

## 2022-02-16 NOTE — Patient Outreach (Addendum)
  Care Coordination   Initial Visit Note   02/16/2022 Name: [name] MRN: [id number] DOB: 1923/05/28  [name] is a 88 y.o. year old female who sees Luking, Elayne Snare, MD for primary care. I spoke with  [name] by phone today.  What matters to the patients health and wellness today?  Continued urinary tract infection.She reports her urinary tract infections (UTIs) started "off and on" prior to her retirement 19 yrs ago  "Look like I can't get rid of it "  She agrees to have RN CM updated her pcp and inquire about further treatment plan, possibly urologist       Goals Addressed               This Visit's Progress     Patient Stated     Manage recurrent urinary infections (THN) (pt-stated)   Not on track     Care Coordination Interventions: Assessment of understanding of urinary tract infection diagnosis Inquired if she has a specialist/urologist- discuss urology services Assessed frequency After patient permission outreach completed via secure chat to her pcp Sent online education on urinary tract infection, adult via her my chart.        SDOH assessments and interventions completed:  Yes  SDOH Interventions Today    Flowsheet Row Most Recent Value  SDOH Interventions   Food Insecurity Interventions Intervention Not Indicated  Transportation Interventions Intervention Not Indicated  Utilities Interventions Intervention Not Indicated  Financial Strain Interventions Intervention Not Indicated        Care Coordination Interventions Activated:  Yes  Care Coordination Interventions:  Yes, provided   Follow up plan: Follow up call scheduled for 03/02/22    Encounter Outcome:  Pt. Visit Completed   [name] L. Lavina Hamman, RN, BSN, [place] Coordinator Office number [phone]

## 2022-02-17 ENCOUNTER — Telehealth: Payer: Self-pay | Admitting: Family Medicine

## 2022-02-17 NOTE — Telephone Encounter (Signed)
Left message for patient to return the call to request more information about any possible UTI symptoms.

## 2022-02-17 NOTE — Telephone Encounter (Signed)
Barbaraann Faster, RN  [name], MD Good afternoon Dr Wolfgang Phoenix, Mrs Hass reports having another UTI She report she has completed antibiotics from August. She is inquiring about any other treatment that would help. Please advise   Kimberly L. Lavina Hamman, RN, BSN, [place] Coordinator  Office number [phone]  Nurses-please touch base with patient If she is having dysuria or burning with urination we would treat that.  If it is just urinary frequency it would be best to get a urine culture if possible  If she is having dysuria/burning she may use Macrobid 1 twice daily for 5 days she is not allergic to [place].  It would be permissible to go ahead and send in the [place] should her symptoms be dysuria and burning.  There is very limited choices regarding antibiotics for this patient because of her multiple sensitivities.  Feel free to gather information and send it back as a phone message.  Once again if her symptomatology fits as a UTI would be fine to treat Macrobid otherwise send me a message then we can go from there thank you

## 2022-02-18 ENCOUNTER — Other Ambulatory Visit: Payer: Self-pay

## 2022-02-18 ENCOUNTER — Telehealth: Payer: Self-pay

## 2022-02-18 MED ORDER — NITROFURANTOIN MONOHYD MACRO 100 MG PO CAPS: 100.0000 mg | ORAL_CAPSULE | Freq: Two times a day (BID) | ORAL | 0 refills | Status: AC

## 2022-02-18 NOTE — Telephone Encounter (Signed)
Patient has been informed per drs notes.

## 2022-02-18 NOTE — Telephone Encounter (Signed)
Pt returned call and states that she is having pain in lower abdominal area, burning and frequency. Pt states it is hard for her to come to office du to not driving and needing to find a ride. Please advise. Thank you [employer].

## 2022-02-18 NOTE — Telephone Encounter (Signed)
Macrobid 1 twice daily for 5 days

## 2022-02-18 NOTE — Telephone Encounter (Signed)
Error

## 2022-03-02 ENCOUNTER — Ambulatory Visit: Payer: Self-pay | Admitting: *Deleted

## 2022-03-02 NOTE — Patient Outreach (Signed)
  Care Coordination   Follow Up Visit Note   03/04/2022 Name: [name] MRN: [id number] DOB: 09-Aug-1923  [name] is a 88 y.o. year old female who sees Luking, Elayne Snare, MD for primary care. I spoke with  [name] by phone today.  What matters to the patients health and wellness today?  Just finished another antibiotic treatment. She reports this is the third treatment.  She confirms she continues to drink lots of fluids and follow good perineal care  She states she will speak with her primary care provider further as needed for other options. She agrees to call RN CM if she feels she needs assist with care coordination  Hip pain vs back bone  She reports she is aware that her imagine indicated deterioration of bone/ She denies radiating pain from hip to back  She takes hydrocodone, use aspercreme with some relief She also states with this concern that she will speak with her doctor and outreach to RN CM if care coordination assistance is needed after RN CM offered to provide assistance  Right knee worse than left knee She reports pain in her feet and hands  She reports she does not consider this pain as neuropathy as she is not diabetic (after RN CM discussed neuropathy to assess if she was having neuropathy)  She voiced understanding when RN CM discussed with her that neuropathy is not just a symptom of diabetic patients  Atrial fibrillation She reports she is managing well    Goals Addressed               This Visit's Progress     Patient Stated     Manage pain at home [facility]) (pt-stated)   Not on track     Care Coordination Interventions: Reviewed provider established plan for pain management Discussed importance of adherence to all scheduled medical appointments Counseled on the importance of reporting any/all new or changed pain symptoms or management strategies to pain management provider Reviewed with patient prescribed pharmacological and  nonpharmacological pain relief strategies Offered care coordination for her various voiced pain areas/symptoms. Agreed to be available if patient needs care coordination at a later time  RN CM discussed neuropathy to assess if she was having neuropathy RN CM discussed with her that neuropathy is not just a symptom of diabetic patients      Manage recurrent urinary infections (THN) (pt-stated)   Not on track     Care Coordination Interventions: Assessment of understanding of urinary tract infection diagnosis Updated patient on the outreach to her pcp. Re assessed her for UTI treatment and any improvements.  Assessed frequency Continued to encourage daily intake of the recommended fluids and good perineal care.Offered to complete further care coordination interventions but patient agrees to collaborate with her pcp and let RN CM know if further assistance is needed.        SDOH assessments and interventions completed:  No     Care Coordination Interventions Activated:  Yes  Care Coordination Interventions:  Yes, provided   Follow up plan: Follow up call scheduled for 05/02/22    Encounter Outcome:  Pt. Visit Completed   Ethelyne Erich L. Lavina Hamman, RN, BSN, [place] Coordinator Office number [phone]

## 2022-03-03 ENCOUNTER — Ambulatory Visit: Payer: Self-pay | Admitting: *Deleted

## 2022-03-03 ENCOUNTER — Encounter: Payer: Self-pay | Admitting: *Deleted

## 2022-03-03 NOTE — Patient Outreach (Signed)
  Care Coordination   Follow Up Visit Note   03/03/2022  Name: ARRABELLA WESTERMAN MRN: [id number] DOB: 1924-04-14  [name] is a 88 y.o. year old female who sees Luking, Elayne Snare, MD for primary care. I spoke with [name] by phone today.  What matters to the patients health and wellness today?  Reduce and Manage Symptoms of Anxiety and Depression.    Goals Addressed               This Visit's Progress     Reduce and Manage Symptoms of Anxiety and Depression. (pt-stated)   On track     Care Coordination Interventions:  Deep Breathing Exercises, Relaxation Techniques & Mindfulness Meditation Strategies Encouraged Daily.   Active Listening/Reflection Utilized.  Emotional Support Provided. Verbalization of Feelings Encouraged.  Problem Solving/Task-Centered Solutions Employed.   Client-Centered Therapy Initiated. Please Schedule Follow-Up Appointment with Dentist to Assess Jaw and Tooth Pain from Recent Fall. Continue to [place] Twice a Week.         SDOH assessments and interventions completed:  Yes.  Care Coordination Interventions Activated:  Yes.   Care Coordination Interventions:  Yes, provided.   Follow up plan: Follow up call scheduled for 03/17/2022 at 10:00 am.  Encounter Outcome:  Pt. Visit Completed.   Nat Christen, BSW, MSW, LCSW  Licensed Education officer, environmental Health System  Mailing [place] [address], [place], [place] [address] Physical Address-[address], [place], [place] [address] Toll Free Main # [phone] Fax # [phone] Cell # [phone] [email or web address]

## 2022-03-03 NOTE — Patient Instructions (Signed)
Visit Information  Thank you for taking time to visit with me today. Please don't hesitate to contact me if I can be of assistance to you.   Following are the goals we discussed today:   Goals Addressed               This Visit's Progress     Reduce and Manage Symptoms of Anxiety and Depression. (pt-stated)   On track     Care Coordination Interventions:  Deep Breathing Exercises, Relaxation Techniques & Mindfulness Meditation Strategies Encouraged Daily.   Active Listening/Reflection Utilized.  Emotional Support Provided. Verbalization of Feelings Encouraged.  Problem Solving/Task-Centered Solutions Employed.   Client-Centered Therapy Initiated. Please Schedule Follow-Up Appointment with Dentist to Assess Jaw and Tooth Pain from Recent Fall. Continue to [place] Twice a Week.         Our next appointment is by telephone on 03/17/2022 at 10:00 am.  Please call the care guide team at [phone] if you need to cancel or reschedule your appointment.   If you are experiencing a Mental Health or [place] or need someone to talk to, please call the Suicide and Crisis Lifeline: 988 call the [place] National Suicide Prevention Lifeline: 269 343 9219 or TTY: 808-411-4327 TTY 347-232-2689) to talk to a trained counselor call 1-800-273-TALK (toll free, 24 hour hotline) go to [facility] [address] [place] [phone]) call the [place]: [phone] call 911  Patient verbalizes understanding of instructions and care plan provided today and agrees to view in [place]. Active MyChart status and patient understanding of how to access instructions and care plan via MyChart confirmed with patient.     Telephone follow up appointment with care management team member scheduled for:  03/17/2022 at 10:00 am.  Nat Christen, BSW, MSW, [place]  Licensed Clinical Social Worker   [place]  Mailing [place]. [address], [place], [place] [address] Physical Address-[address], [place], [place] [address] Toll Free Main # [phone] Fax # [phone] Cell # [phone] [email or web address]

## 2022-03-04 NOTE — Patient Instructions (Addendum)
Visit Information  Thank you for taking time to visit with me today. Please don't hesitate to contact me if I can be of assistance to you.   Following are the goals we discussed today:   Goals Addressed               This Visit's Progress     Patient Stated     Manage pain at home [facility]) (pt-stated)   Not on track     Care Coordination Interventions: Reviewed provider established plan for pain management Discussed importance of adherence to all scheduled medical appointments Counseled on the importance of reporting any/all new or changed pain symptoms or management strategies to pain management provider Reviewed with patient prescribed pharmacological and nonpharmacological pain relief strategies Offered care coordination for her various voiced pain areas/symptoms. Agreed to be available if patient needs care coordination at a later time  RN CM discussed neuropathy to assess if she was having neuropathy RN CM discussed with her that neuropathy is not just a symptom of diabetic patients      Manage recurrent urinary infections (THN) (pt-stated)   Not on track     Care Coordination Interventions: Assessment of understanding of urinary tract infection diagnosis Updated patient on the outreach to her pcp. Re assessed her for UTI treatment and any improvements.  Assessed frequency Continued to encourage daily intake of the recommended fluids and good perineal care.Offered to complete further care coordination interventions but patient agrees to collaborate with her pcp and let RN CM know if further assistance is needed.        Our next appointment is by telephone on 05/02/22 at 1:30 pm  Please call the care guide team at [phone] if you need to cancel or reschedule your appointment.   If you are experiencing a Mental Health or [place] or need someone to talk to, please call the Suicide and Crisis Lifeline: 988 call the [place] National Suicide Prevention Lifeline:  515-304-7713 or TTY: 405-288-3336 TTY 917 851 2175) to talk to a trained counselor call 1-800-273-TALK (toll free, 24 hour hotline) call the [facility]: [phone] call 911   Patient verbalizes understanding of instructions and care plan provided today and agrees to view in [place]. Active MyChart status and patient understanding of how to access instructions and care plan via MyChart confirmed with patient.     The patient has been provided with contact information for the care management team and has been advised to call with any health related questions or concerns.   Isiah Scheel L. Lavina Hamman, RN, BSN, [place] Coordinator Office number [phone]

## 2022-03-07 ENCOUNTER — Other Ambulatory Visit: Payer: Self-pay | Admitting: Family Medicine

## 2022-03-09 NOTE — Telephone Encounter (Signed)
3 months each

## 2022-03-17 ENCOUNTER — Other Ambulatory Visit: Payer: Self-pay | Admitting: Family Medicine

## 2022-03-17 ENCOUNTER — Encounter: Payer: Self-pay | Admitting: *Deleted

## 2022-03-17 ENCOUNTER — Ambulatory Visit: Payer: Self-pay | Admitting: *Deleted

## 2022-03-17 NOTE — Patient Outreach (Signed)
  Care Coordination   Follow Up Visit Note   03/17/2022  Name: [name] MRN: [id number] DOB: 25-Aug-1923  [name] is a 88 y.o. year old female who sees Luking, Elayne Snare, MD for primary care. I spoke with [name] by phone today.  What matters to the patients health and wellness today?  Reduce and Manage Symptoms of Anxiety and Depression.    Goals Addressed               This Visit's Progress     Reduce and Manage Symptoms of Anxiety and Depression. (pt-stated)   On track     Care Coordination Interventions:  Active Listening Utilized.  Emotional Support Provided. Verbalization of Feelings Encouraged.  Cognitive Behavioral Therapy Performed. Client-Centered Therapy Initiated. CSW Collaboration with Primary Care Provider, Dr. Sallee Lange to Report Continued Symptoms of Urinary Tract Infection, Despite 3 Different Prescription Antibiotics. Please Review EMMI Educational Material, Mailed on 03/17/2022:  ~ Depression in Adults  ~ Generalized Anxiety Disorder Be Prepared to Discuss EMMI Educational Material During Our Next Scheduled Telephone Outreach Call.        SDOH assessments and interventions completed:  Yes.  Care Coordination Interventions Activated:  Yes.   Care Coordination Interventions:  Yes, provided.   Follow up plan: Follow up call scheduled for 03/31/2022 at 10:45 am.  Encounter Outcome:  Pt. Visit Completed.   [name], BSW, MSW, LCSW  Licensed Education officer, environmental Health System  Mailing [place] [address]. [address], [place], [place] [address] Physical Address-[address], [place], [place] [address] Toll Free Main # [phone] Fax # [phone] Cell # [phone] [email or web address]

## 2022-03-17 NOTE — Patient Instructions (Signed)
Visit Information  Thank you for taking time to visit with me today. Please don't hesitate to contact me if I can be of assistance to you.   Following are the goals we discussed today:   Goals Addressed               This Visit's Progress     Reduce and Manage Symptoms of Anxiety and Depression. (pt-stated)   On track     Care Coordination Interventions:  Active Listening Utilized.  Emotional Support Provided. Verbalization of Feelings Encouraged.  Cognitive Behavioral Therapy Performed. Client-Centered Therapy Initiated. CSW Collaboration with Primary Care Provider, Dr. Sallee Lange to Report Continued Symptoms of Urinary Tract Infection, Despite 3 Different Prescription Antibiotics. Please Review EMMI Educational Material, Mailed on 03/17/2022:  ~ Depression in Adults  ~ Generalized Anxiety Disorder Be Prepared to Discuss EMMI Educational Material During Our Next Scheduled Telephone Outreach Call.        Our next appointment is by telephone on 03/31/2022 at 10:45 am.  Please call the care guide team at [phone] if you need to cancel or reschedule your appointment.   If you are experiencing a Mental Health or [place] or need someone to talk to, please call the Suicide and Crisis Lifeline: 988 call the [place] National Suicide Prevention Lifeline: 5152214662 or TTY: (364)753-5111 TTY (204)644-5741) to talk to a trained counselor call 1-800-273-TALK (toll free, 24 hour hotline) go to [facility] [address] [place] [phone]) call the [place]: [phone] call 911  Patient verbalizes understanding of instructions and care plan provided today and agrees to view in [place]. Active MyChart status and patient understanding of how to access instructions and care plan via MyChart confirmed with patient.     Telephone follow up appointment with care management team member scheduled for:   03/31/2022 at 10:45 am.  Nat Christen, BSW, MSW, [place]  Licensed Clinical Social Worker  [place]  Mailing [place]. [address], [place], [place] [address] Physical Address[address], [place], [name] [address] Toll Free Main # [phone] Fax # [phone] Cell # [phone] [email or web address]

## 2022-03-18 ENCOUNTER — Encounter: Payer: Self-pay | Admitting: *Deleted

## 2022-03-31 ENCOUNTER — Encounter: Payer: Self-pay | Admitting: *Deleted

## 2022-03-31 ENCOUNTER — Ambulatory Visit: Payer: Self-pay | Admitting: *Deleted

## 2022-03-31 NOTE — Patient Instructions (Addendum)
Visit Information  Thank you for taking time to visit with me today. Please don't hesitate to contact me if I can be of assistance to you.   Following are the goals we discussed today:   Goals Addressed               This Visit's Progress     Reduce and Manage Symptoms of Anxiety and Depression. (pt-stated)   On track     Care Coordination Interventions:  Active Listening Utilized.  Caregiver Stress Acknowledged. Emotional and Caregiver Support Provided. Caregiver Resources Reviewed. Verbalization of Feelings Encouraged.  Cognitive Behavioral Therapy Performed. Solution-Focused Therapy Initiated.  Reviewed the Following EMMI Educational Material, to Ensure Understanding:  ~ Depression in Adults  ~ Generalized Anxiety Disorder Please Consider Self-Enrolling in a Caregiver Support Group, From the List Provided.        Our next appointment is by telephone on 04/13/2022 at 12:45 pm.  Please call the care guide team at [phone] if you need to cancel or reschedule your appointment.   If you are experiencing a Mental Health or [place] or need someone to talk to, please call the Suicide and Crisis Lifeline: 988 call the [place] National Suicide Prevention Lifeline: (412)763-2363 or TTY: 406-493-5958 TTY 5716993822) to talk to a trained counselor call 1-800-273-TALK (toll free, 24 hour hotline) go to [facility] [address] [place] [phone]) call the [place]: [phone] call 911  Patient verbalizes understanding of instructions and care plan provided today and agrees to view in [place]. Active MyChart status and patient understanding of how to access instructions and care plan via MyChart confirmed with patient.     Telephone follow up appointment with care management team member scheduled for:  04/13/2022 at 12:45 pm.  Nat Christen, BSW, MSW, [place]  Licensed Clinical Social Worker   [place]  Mailing [place]. [address] [place], [place] [address] Physical Address[address] [place], [place] [address] Toll Free Main # [phone] Fax # [phone] Cell # [phone] [email or web address]

## 2022-03-31 NOTE — Patient Outreach (Addendum)
  Care Coordination   Follow Up Visit Note   03/31/2022  Name: [name] MRN: [id number] DOB: 07-18-1923  [name] is a 88 y.o. year old female who sees Luking, Elayne Snare, MD for primary care. I spoke with [name] by phone today.  What matters to the patients health and wellness today?  Reduce and Manage Symptoms of Anxiety and Depression.    Goals Addressed               This Visit's Progress     Reduce and Manage Symptoms of Anxiety and Depression. (pt-stated)   On track     Care Coordination Interventions:  Active Listening Utilized.  Caregiver Stress Acknowledged. Emotional and Caregiver Support Provided. Caregiver Resources Reviewed. Verbalization of Feelings Encouraged.  Cognitive Behavioral Therapy Performed. Solution-Focused Therapy Initiated.  Reviewed the Following EMMI Educational Material, to Ensure Understanding:  ~ Depression in Adults  ~ Generalized Anxiety Disorder Please Consider Self-Enrolling in a Caregiver Support Group, From the List Provided.        SDOH assessments and interventions completed:  Yes.  Care Coordination Interventions Activated:  Yes.   Care Coordination Interventions:  Yes, provided.   Follow up plan: Follow up call scheduled for 04/13/2022 at 12:45 pm.  Encounter Outcome:  Pt. Visit Completed.   Nat Christen, BSW, MSW, LCSW  Licensed Education officer, environmental Health System  Mailing [place] [address] [place], [place] [address] Physical Address[address] [place], [place] [address] Toll Free Main # [phone] Fax # [phone] Cell # [phone] [email or web address]

## 2022-04-01 ENCOUNTER — Other Ambulatory Visit: Payer: Self-pay

## 2022-04-01 MED ORDER — LISINOPRIL 2.5 MG PO TABS: 2.5000 mg | ORAL_TABLET | Freq: Every day | ORAL | 1 refills | Status: DC

## 2022-04-06 ENCOUNTER — Encounter: Payer: Self-pay | Admitting: Family Medicine

## 2022-04-06 ENCOUNTER — Ambulatory Visit (INDEPENDENT_AMBULATORY_CARE_PROVIDER_SITE_OTHER): Payer: Medicare Other | Admitting: Family Medicine

## 2022-04-06 VITALS — BP 146/76 | HR 62 | Temp 97.3°F | Ht 64.0 in

## 2022-04-06 DIAGNOSIS — I10 Essential (primary) hypertension: Secondary | ICD-10-CM | POA: Diagnosis not present

## 2022-04-06 DIAGNOSIS — R300 Dysuria: Secondary | ICD-10-CM | POA: Diagnosis not present

## 2022-04-06 DIAGNOSIS — E039 Hypothyroidism, unspecified: Secondary | ICD-10-CM | POA: Diagnosis not present

## 2022-04-06 DIAGNOSIS — R296 Repeated falls: Secondary | ICD-10-CM

## 2022-04-06 DIAGNOSIS — N3000 Acute cystitis without hematuria: Secondary | ICD-10-CM

## 2022-04-06 DIAGNOSIS — E538 Deficiency of other specified B group vitamins: Secondary | ICD-10-CM | POA: Diagnosis not present

## 2022-04-06 DIAGNOSIS — M545 Low back pain, unspecified: Secondary | ICD-10-CM

## 2022-04-06 DIAGNOSIS — I4811 Longstanding persistent atrial fibrillation: Secondary | ICD-10-CM

## 2022-04-06 LAB — POCT URINALYSIS DIP (CLINITEK)
Bilirubin, UA: NEGATIVE
Glucose, UA: NEGATIVE mg/dL
Ketones, POC UA: NEGATIVE mg/dL
Nitrite, UA: POSITIVE — AB
POC PROTEIN,UA: NEGATIVE
Spec Grav, UA: 1.01 (ref 1.010–1.025)
Urobilinogen, UA: 0.2 E.U./dL
pH, UA: 5 (ref 5.0–8.0)

## 2022-04-06 MED ORDER — DOXYCYCLINE HYCLATE 100 MG PO TABS: 100.0000 mg | ORAL_TABLET | Freq: Two times a day (BID) | ORAL | 0 refills | Status: DC

## 2022-04-06 MED ORDER — HYDROCODONE-ACETAMINOPHEN 5-325 MG PO TABS: 1.0000 | ORAL_TABLET | Freq: Four times a day (QID) | ORAL | 0 refills | Status: DC | PRN

## 2022-04-06 NOTE — Progress Notes (Signed)
   Subjective:    Patient ID: [name], female    DOB: Apr 27, 1924, 88 y.o.   MRN: [id number]  HPI  3 month follow up  Dysuria,frequent urination frequency with urination.  Denies high fever chills.  No vomiting diarrhea.  Has chronic intermittent lower back pain as well as knee pain patient states she hurts almost every day we talked about the safe use of pain medicine that can be used but should be used infrequently not more than once per day She states that the pain medicine does benefit her Because of her past health history it is not recommended to be on NSAIDs.  She may use Tylenol as needed  Review of Systems     Objective:   Physical Exam  General-in no acute distress Eyes-no discharge Lungs-respiratory rate normal, CTA CV-no murmur  Extremities skin warm dry no edema Neuro grossly normal Behavior normal, alert       Assessment & Plan:  1. Dysuria Urine culture - POCT URINALYSIS DIP (CLINITEK) - Urine Culture  2. Frequent falls Staying off of Eliquis currently Has had frequent falls and intracerebral hemorrhage Therefore Eliquis not indicated Patient does use walker - TSH + free T4 - Basic Metabolic Panel - Vitamin D32 - Urine Culture  3. Vitamin B12 deficiency B12 continues oral B12 supplement - TSH + free T4 - Basic Metabolic Panel - Vitamin K02 - Urine Culture  4. Hypothyroidism, unspecified type Continue medication check lab work - TSH + free T4  5. Hypertension, unspecified type Check lab work - TSH + free T4 - Basic Metabolic Panel - Vitamin R42 - Urine Culture  Antibiotics prescribed for UTI  Patient uses pain medicine infrequently to help her with severe lumbar pain and pain in her hips as well as her knees not for frequent use caution drowsiness

## 2022-04-07 LAB — BASIC METABOLIC PANEL
BUN/Creatinine Ratio: 20 (ref 12–28)
BUN: 32 mg/dL (ref 10–36)
CO2: 23 mmol/L (ref 20–29)
Calcium: 9.4 mg/dL (ref 8.7–10.3)
Chloride: 98 mmol/L (ref 96–106)
Creatinine, Ser: 1.62 mg/dL — ABNORMAL HIGH (ref 0.57–1.00)
Glucose: 93 mg/dL (ref 70–99)
Potassium: 4.3 mmol/L (ref 3.5–5.2)
Sodium: 140 mmol/L (ref 134–144)
eGFR: 29 mL/min/{1.73_m2} — ABNORMAL LOW (ref >59)

## 2022-04-07 LAB — TSH+FREE T4
Free T4: 1.44 ng/dL (ref 0.82–1.77)
TSH: 2.17 u[IU]/mL (ref 0.450–4.500)

## 2022-04-07 LAB — VITAMIN B12: Vitamin B-12: 1536 pg/mL — ABNORMAL HIGH (ref 232–1245)

## 2022-04-09 ENCOUNTER — Encounter: Payer: Self-pay | Admitting: Family Medicine

## 2022-04-09 LAB — URINE CULTURE

## 2022-04-11 MED ORDER — CIPROFLOXACIN HCL 250 MG PO TABS: 250.0000 mg | ORAL_TABLET | Freq: Two times a day (BID) | ORAL | 0 refills | Status: AC

## 2022-04-11 NOTE — Addendum Note (Signed)
Addended by: Dairl Ponder on: 04/11/2022 03:18 PM   Modules accepted: Orders

## 2022-04-13 ENCOUNTER — Encounter: Payer: Self-pay | Admitting: *Deleted

## 2022-04-13 ENCOUNTER — Ambulatory Visit: Payer: Self-pay | Admitting: *Deleted

## 2022-04-13 NOTE — Patient Instructions (Signed)
Visit Information  Thank you for taking time to visit with me today. Please don't hesitate to contact me if I can be of assistance to you.   Following are the goals we discussed today:   Goals Addressed               This Visit's Progress     Reduce and Manage Symptoms of Anxiety and Depression. (pt-stated)   On track     Care Coordination Interventions:  Active Listening/Reflection Utilized.  Caregiver Stress Acknowledged. Emotional and Caregiver Support Provided. Caregiver Resources Reviewed. Verbalization of Feelings Encouraged.  Client-Centered Therapy Performed. Solution-Focused Therapy Initiated.  Continue to Consider Self-Enrolling in a Caregiver Support Group, From the List Provided. Continue to Take Prescription Medication for Urinary Tract Infection, Notifying Primary Care Provider, Dr. Sallee Lange if Symptoms Persist.        Our next appointment is by telephone on 04/27/2022 at 9:45 am.  Please call the care guide team at [phone] if you need to cancel or reschedule your appointment.   If you are experiencing a Mental Health or [place] or need someone to talk to, please call the Suicide and Crisis Lifeline: 988 call the [place] National Suicide Prevention Lifeline: 629-880-9844 or TTY: (814)230-9806 TTY (432) 474-8518) to talk to a trained counselor call 1-800-273-TALK (toll free, 24 hour hotline) go to [facility] [address] [place] [phone]) call the [place]: [phone] call 911  Patient verbalizes understanding of instructions and care plan provided today and agrees to view in [place]. Active MyChart status and patient understanding of how to access instructions and care plan via MyChart confirmed with patient.     Telephone follow up appointment with care management team member scheduled for:  04/27/2022 at 9:45 am.  Nat Christen, BSW, MSW, [place]  Licensed  Clinical Social Worker  [place]  Mailing [place]. [address] [place], [place] [address] Physical Address[address] [place], [place] [address] Toll Free Main # [phone] Fax # [phone] Cell # [phone] [email or web address]

## 2022-04-13 NOTE — Patient Outreach (Signed)
  Care Coordination   Follow Up Visit Note   04/13/2022  Name: [name] MRN: [id number] DOB: 1923-12-04  [name] is a 88 y.o. year old female who sees Luking, Elayne Snare, MD for primary care. I spoke with [name] by phone today.  What matters to the patients health and wellness today?   Reduce and Manage Symptoms of Anxiety and Depression.   Goals Addressed               This Visit's Progress     Reduce and Manage Symptoms of Anxiety and Depression. (pt-stated)   On track     Care Coordination Interventions:  Active Listening/Reflection Utilized.  Caregiver Stress Acknowledged. Emotional and Caregiver Support Provided. Caregiver Resources Reviewed. Verbalization of Feelings Encouraged.  Client-Centered Therapy Performed. Solution-Focused Therapy Initiated.  Continue to Consider Self-Enrolling in a Caregiver Support Group, From the List Provided. Continue to Take Prescription Medication for Urinary Tract Infection, Notifying Primary Care Provider, Dr. Sallee Lange if Symptoms Persist.        SDOH assessments and interventions completed:  Yes.  Care Coordination Interventions Activated:  Yes.   Care Coordination Interventions:  Yes, provided.   Follow up plan: Follow up call scheduled for 04/27/2022 at 9:45 am.  Encounter Outcome:  Pt. Visit Completed.   Nat Christen, BSW, MSW, LCSW  Licensed Education officer, environmental Health System  Mailing [place] [address]. [address] [place], [place] [address] Physical Address[address] [place], [place] [address] Toll Free Main # [phone] Fax # [phone] Cell # [phone] [email or web address]

## 2022-04-27 ENCOUNTER — Ambulatory Visit: Payer: Self-pay | Admitting: *Deleted

## 2022-04-27 ENCOUNTER — Encounter: Payer: Self-pay | Admitting: *Deleted

## 2022-04-27 NOTE — Patient Instructions (Signed)
Visit Information  Thank you for taking time to visit with me today. Please don't hesitate to contact me if I can be of assistance to you.   Following are the goals we discussed today:   Goals Addressed               This Visit's Progress     Reduce and Manage Symptoms of Anxiety and Depression. (pt-stated)   On track     Care Coordination Interventions:  Active Listening/Reflection Utilized.  Caregiver Stress Acknowledged. Emotional and Caregiver Support Provided. Verbalization of Feelings Encouraged.  Client-Centered Interventions Activated.   Solution-Focused Therapy Initiated.  Continue to [place], Every Monday and Wednesday for 4 Hours, to Assist with Light Housekeeping Duties, Meal Preparation, Grocery Shopping, Laundry, Bathing, [employer], Running Errands, [employer], [place]. CSW Collaboration with Primary Care Provider, Dr. Sallee Lange to Report Recent Falls, Fortunately Without Sustaining Injury.  ~ Encouraged to Gwynneth Aliment to Assist with Ambulation, Gait/Balance Concerns and Safety.      Our next appointment is by telephone on 05/11/2022 at 9:45 am.  Please call the care guide team at [phone] if you need to cancel or reschedule your appointment.   If you are experiencing a Mental Health or [place] or need someone to talk to, please call the Suicide and Crisis Lifeline: 988 call the [place] National Suicide Prevention Lifeline: 579-294-0691 or TTY: 318-149-4908 TTY 628-014-5489) to talk to a trained counselor call 1-800-273-TALK (toll free, 24 hour hotline) go to [facility] [address] [place] [phone]) call the [place]: [phone] call 911  Patient verbalizes understanding of instructions and care plan provided today and agrees to view in [place]. Active MyChart status and patient understanding of how to access instructions and  care plan via MyChart confirmed with patient.     Telephone follow up appointment with care management team member scheduled for:  05/11/2022 at 9:45 am.  [name], BSW, MSW, [place]  Licensed Clinical Social Worker  [place]  Mailing [place]. [address], [place], [place] [address] Physical Address[address] [place], [place] [address] Toll Free Main # [phone] Fax # [phone] Cell # [phone] [email or web address]

## 2022-04-27 NOTE — Patient Outreach (Signed)
  Care Coordination   Follow Up Visit Note   04/27/2022  Name: [name] MRN: [id number] DOB: 1923/05/25  [name] is a 88 y.o. year old female who sees Luking, Elayne Snare, MD for primary care. I spoke with [name] by phone today.  What matters to the patients health and wellness today?   Reduce and Manage Symptoms of Anxiety and Depression.   Goals Addressed               This Visit's Progress     Reduce and Manage Symptoms of Anxiety and Depression. (pt-stated)   On track     Care Coordination Interventions:  Active Listening/Reflection Utilized.  Caregiver Stress Acknowledged. Emotional and Caregiver Support Provided. Verbalization of Feelings Encouraged.  Client-Centered Interventions Activated.   Solution-Focused Therapy Initiated.  Continue to [place], Every Monday and Wednesday for 4 Hours, to Assist with Light Housekeeping Duties, Meal Preparation, Grocery Shopping, Laundry, Bathing, [employer], Running Errands, [employer], [place]. CSW Collaboration with Primary Care Provider, Dr. Sallee Lange to Report Recent Falls, Fortunately Without Sustaining Injury.  ~ Encouraged to Gwynneth Aliment to Assist with Ambulation, Gait/Balance Concerns and Safety.      SDOH assessments and interventions completed:  Yes.  Care Coordination Interventions:  Yes, provided.   Follow up plan: Follow up call scheduled for 05/11/2022 at 9:45 am.  Encounter Outcome:  Pt. Visit Completed.   Nat Christen, BSW, MSW, LCSW  Licensed Education officer, environmental Health System  Mailing [place] [address]. [address], [place], [place] [address] Physical Address[address] [place], [place] [address] Toll Free Main # [phone] Fax # [phone] Cell # [phone] [email or web address]

## 2022-05-02 ENCOUNTER — Ambulatory Visit: Payer: Self-pay | Admitting: *Deleted

## 2022-05-02 NOTE — Patient Outreach (Signed)
  Care Coordination   05/02/2022 Name: [name] MRN: [id number] DOB: [birth year]   Care Coordination Outreach Attempts:  An unsuccessful telephone outreach was attempted today to offer the patient information about available care coordination services as a benefit of their health plan.   Follow Up Plan:  Additional outreach attempts will be made to offer the patient care coordination information and services.   Encounter Outcome:  No Answer   Care Coordination Interventions:  No, not indicated    Ludean Duhart L. Lavina Hamman, RN, BSN, [place] Coordinator Office number [phone]

## 2022-05-03 ENCOUNTER — Other Ambulatory Visit: Payer: Self-pay | Admitting: Family Medicine

## 2022-05-04 NOTE — Telephone Encounter (Signed)
6 months on each 

## 2022-05-05 ENCOUNTER — Ambulatory Visit: Payer: Self-pay | Admitting: *Deleted

## 2022-05-05 NOTE — Patient Outreach (Signed)
  Care Coordination   Follow Up Visit Note   09/06/2022 late entry for 05/05/22 Name: [name] MRN: [id number] DOB: 13-Jan-1924  [name] is a 88 y.o. year old female who sees Luking, Jonna Coup, MD for primary care. I spoke with  [name] by phone today when she returned RN CM's call.  What matters to the patients health and wellness today?  She reports it is hard to get around fast at her age, voiced concerns today related to one of her medications that is causing her "to see someone in the room at night", Recurrent UTIs after she completes recurrent doses of antibiotics, plus a social concern related to her great grandson that she does not feel comfortable discussing Medicine making her feel like someone in the room She did not take several medicines last night  Has female staff helping her who wrote a list Visits her once a week on Monday  86 year old autistic great grandson & teenage sisters father  her daughter is has mental concerns Son with cancer Radiation  SW- She interacts with her about her concerns but has not mentioned the great grandson concern Bowels fine  Cardiac no s/s UTI still have continued symptoms   Goals Addressed               This Visit's Progress     Patient Stated     Manage pain at home [facility]) (pt-stated)   On track     Care Coordination Interventions: Reviewed provider established plan for pain management Discussed importance of adherence to all scheduled medical appointments Counseled on the importance of reporting any/all new or changed pain symptoms or management strategies to pain management provider Reviewed with patient prescribed pharmacological and nonpharmacological pain relief strategies Offered care coordination for her various voiced pain areas/symptoms. Agreed to be available if patient needs care coordination at a later time       Manage recurrent urinary infections ([facility]) (pt-stated)   Not on track     Care Coordination  Interventions: Assessment of understanding of urinary tract infection diagnosis Updated patient on the outreach to her pcp. Re assessed her for UTI treatment and any improvements.  Assessed frequency Continued to encourage daily intake of the recommended fluids and good perineal care.                     SDOH assessments and interventions completed:  No     Care Coordination Interventions:  Yes, provided   Follow up plan: Follow up call scheduled for 09/06/22    Encounter Outcome:  Pt. Visit Completed    Anaelle Dunton L. Noelle Penner, RN, BSN, CCM [facility] Care Coordinator Office number [phone]

## 2022-05-05 NOTE — Patient Outreach (Signed)
  Care Coordination   05/05/2022 Name: [name] MRN: [id number] DOB: [birth year]   Care Coordination Outreach Attempts:  A second unsuccessful outreach was attempted today to offer the patient with information about available care coordination services as a benefit of their health plan.     Follow Up Plan:  Additional outreach attempts will be made to offer the patient care coordination information and services.   Encounter Outcome:  No Answer   Care Coordination Interventions:  No, not indicated     Jolynda Townley L. Lavina Hamman, RN, BSN, [place] Coordinator Office number [phone]

## 2022-05-11 ENCOUNTER — Telehealth: Payer: Self-pay | Admitting: Family Medicine

## 2022-05-11 ENCOUNTER — Encounter: Payer: Self-pay | Admitting: *Deleted

## 2022-05-11 ENCOUNTER — Ambulatory Visit: Payer: Self-pay | Admitting: *Deleted

## 2022-05-11 MED ORDER — DOXYCYCLINE HYCLATE 100 MG PO TABS: 100.0000 mg | ORAL_TABLET | Freq: Two times a day (BID) | ORAL | 0 refills | Status: DC

## 2022-05-11 NOTE — Telephone Encounter (Signed)
Di Kindle (Education officer, museum for our office) contacted office regarding patient. Di Kindle had a phone call with her and Sherree states that she is still having lower abdominal pain, side pain, low grade fever and was unable to get out of chair due to pain. Di Kindle states that pt told her PCP did not want her coming in due to all the sickness. No availability today-please advise. Thank you

## 2022-05-11 NOTE — Telephone Encounter (Signed)
Please note-due to her multiple drug allergies she is not able to tolerate most medicines I would recommend doxycycline 100 mg 1 twice daily for 7 days I would also recommend a follow-up office visit in 2 to 3 weeks to recheck Schedule her to come in either in the morning hours or early afternoon hours that way if she avoids the sickness patient's thank you

## 2022-05-11 NOTE — Patient Instructions (Signed)
Visit Information  Thank you for taking time to visit with me today. Please don't hesitate to contact me if I can be of assistance to you.   Following are the goals we discussed today:   Goals Addressed               This Visit's Progress     Reduce and Manage Symptoms of Anxiety and Depression. (pt-stated)   On track     Care Coordination Interventions:  Active Listening/Reflection Utilized.  Caregiver Stress Acknowledged. Caregiver Resources Reviewed. Verbalization of Feelings Encouraged. Feelings of Frustration Validated. Emotional and Caregiver Support Provided. Deep Breathing Exercises, Relaxation Techniques & Mindfulness Meditation Strategies Reviewed & Encouraged Daily. Client-Centered Interventions Employed. Solution-Focused Therapy Performed. Brief Cognitive Behavioral Therapy Initiated.  Continue to [place], Every Monday and Wednesday for 4 Hours, to Assist with Light Housekeeping Duties, Meal Preparation, Grocery Shopping, Laundry, Bathing, [employer], Running Errands, [employer], [place]. CSW Collaboration with Lavella Lemons, Nurse for Primary Care Provider, Dr. Sallee Lange with [place] [phone]), to Advocate for An Appointment with Dr. Sallee Lange Sooner Than 05/24/2022 at 2:30 PM. CSW Collaboration with Lavella Lemons, Nurse for Primary Care Provider, Dr. Sallee Lange with [place] [phone]# [phone]), to Report The Following Symptoms Associated with Patient's Urinary Tract Infection, as Reported By Patient:   ~ Diminished Quality of Life   ~ Inability to Stand or Ambulate for Any Length of Time   ~ Sleep Deprivation From Sleeping in Recliner   ~ Inability to Exercise   ~ Pain in Lower Abdomen   ~ Low Grade Fever   ~ Pain with Urination   ~ Irritability   ~ Frustration with Inability to Completely Eliminate Symptoms of Urinary  Tract Infection          Our next appointment is by telephone on  05/25/2022 at 11:15 am.  Please call the care guide team at [phone] if you need to cancel or reschedule your appointment.   If you are experiencing a Mental Health or [place] or need someone to talk to, please call the Suicide and Crisis Lifeline: 988 call the [place] National Suicide Prevention Lifeline: 573-646-6495 or TTY: 435-117-6551 TTY 320-517-8889) to talk to a trained counselor call 1-800-273-TALK (toll free, 24 hour hotline) go to [facility] Care [address], [place] [phone]) call the [place]: [phone] call 911  Patient verbalizes understanding of instructions and care plan provided today and agrees to view in [place]. Active MyChart status and patient understanding of how to access instructions and care plan via MyChart confirmed with patient.     Telephone follow up appointment with care management team member scheduled for:  05/25/2022 at 11:15 am.  Nat Christen, BSW, MSW, [place]  Licensed Clinical Social Worker  [place]  Mailing [place]. [address], [place], [place] [address] Physical Address-[address] [place], [place] [address] Toll Free Main # [phone] Fax # [phone] Cell # [phone] Di [email or web address]

## 2022-05-11 NOTE — Addendum Note (Signed)
Addended by: Dairl Ponder on: 05/11/2022 05:01 PM   Modules accepted: Orders

## 2022-05-11 NOTE — Telephone Encounter (Signed)
Patient states she has been having this a long time and has been on 3 different meds for a UTI and it has not helped and she thinks she needs something different and does not want any of the previous meds resent in. Patient states she doesn't have fever just the pain and having to go to the bathroom all the time  Patient stated she was told not to come in now due to all the sickness and has an appt 05/24/21 and really wants something sent in  [place]

## 2022-05-11 NOTE — Telephone Encounter (Signed)
Prescription sent electronically to pharmacy. Patient notified and Patient has follow up scheduled 05/24/22

## 2022-05-11 NOTE — Patient Outreach (Signed)
  Care Coordination   Follow Up Visit Note   05/11/2022  Name: [name] MRN: [id number] DOB: 05-30-1923  [name] is a 88 y.o. year old female who sees Luking, Elayne Snare, MD for primary care. I spoke with [name] by phone today.  What matters to the patients health and wellness today?  Reduce and Manage Symptoms of Anxiety and Depression.    Goals Addressed               This Visit's Progress     Reduce and Manage Symptoms of Anxiety and Depression. (pt-stated)   On track     Care Coordination Interventions:  Active Listening/Reflection Utilized.  Caregiver Stress Acknowledged. Caregiver Resources Reviewed. Verbalization of Feelings Encouraged. Feelings of Frustration Validated. Emotional and Caregiver Support Provided. Deep Breathing Exercises, Relaxation Techniques & Mindfulness Meditation Strategies Reviewed & Encouraged Daily. Client-Centered Interventions Employed. Solution-Focused Therapy Performed. Brief Cognitive Behavioral Therapy Initiated.  Continue to [place], Every Monday and Wednesday for 4 Hours, to Assist with Light Housekeeping Duties, Meal Preparation, Grocery Shopping, Laundry, Bathing, [employer], Running Errands, [employer], [employer]. CSW Collaboration with Lavella Lemons, Nurse for Primary Care Provider, Dr. Sallee Lange with [place] [phone]), to Advocate for An Appointment with Dr. Sallee Lange Sooner Than 05/24/2022 at 2:30 PM. CSW Collaboration with Lavella Lemons, Nurse for Primary Care Provider, Dr. Sallee Lange with [place] [phone]# [phone]), to Report The Following Symptoms Associated with Patient's Urinary Tract Infection, as Reported By Patient:   ~ Diminished Quality of Life   ~ Inability to Stand or Ambulate for Any Length of Time   ~ Sleep Deprivation From Sleeping in Recliner   ~ Inability to Exercise   ~ Pain in Lower Abdomen   ~ Low Grade Fever   ~ Pain  with Urination   ~ Irritability   ~ Frustration with Inability to Completely Eliminate Symptoms of Urinary  Tract Infection          SDOH assessments and interventions completed:  Yes.  Care Coordination Interventions:  Yes, provided.   Follow up plan: Follow up call scheduled for 05/25/2022 at 11:15 am.  Encounter Outcome:  Pt. Visit Completed.   Nat Christen, BSW, MSW, LCSW  Licensed Education officer, environmental Health System  Mailing [place] [address]. [address] [place], [place] [address] Physical Address-[address] [place], [place] [address] Toll Free Main # [phone] Fax # [phone] Cell # [phone] Di [email or web address]

## 2022-05-11 NOTE — Telephone Encounter (Signed)
Nurses-I recommend talking with the patient.  Would be useful to know how long she has had this?  To some degree she has ongoing abdominal pain has had frequent UTIs off and on for quite some time We have openings tomorrow afternoon she may be given an office slot for that.  If she feels she needs to be seen sooner please let me know thank you-unfortunately no availability for today ideally we do not want to send her to the ER unless absolutely necessary

## 2022-05-24 ENCOUNTER — Ambulatory Visit (INDEPENDENT_AMBULATORY_CARE_PROVIDER_SITE_OTHER): Payer: Medicare Other | Admitting: Family Medicine

## 2022-05-24 VITALS — BP 147/69 | HR 58 | Temp 98.9°F | Ht 64.0 in | Wt 148.0 lb

## 2022-05-24 DIAGNOSIS — N390 Urinary tract infection, site not specified: Secondary | ICD-10-CM | POA: Diagnosis not present

## 2022-05-24 DIAGNOSIS — R443 Hallucinations, unspecified: Secondary | ICD-10-CM | POA: Diagnosis not present

## 2022-05-24 DIAGNOSIS — R296 Repeated falls: Secondary | ICD-10-CM | POA: Diagnosis not present

## 2022-05-24 DIAGNOSIS — R300 Dysuria: Secondary | ICD-10-CM | POA: Diagnosis not present

## 2022-05-24 LAB — POCT URINALYSIS DIP (CLINITEK)
Bilirubin, UA: NEGATIVE
Blood, UA: NEGATIVE
Glucose, UA: NEGATIVE mg/dL
Ketones, POC UA: NEGATIVE mg/dL
Nitrite, UA: NEGATIVE
POC PROTEIN,UA: NEGATIVE
Spec Grav, UA: 1.025 (ref 1.010–1.025)
Urobilinogen, UA: 0.2 E.U./dL
pH, UA: 5 (ref 5.0–8.0)

## 2022-05-24 MED ORDER — HYDROCODONE-ACETAMINOPHEN 5-325 MG PO TABS: 1.0000 | ORAL_TABLET | Freq: Four times a day (QID) | ORAL | 0 refills | Status: DC | PRN

## 2022-05-24 NOTE — Progress Notes (Signed)
   Subjective:    Patient ID: [name], female    DOB: [birth year], 89 y.o.   MRN: [id number]  HPI Frequent urination, patient has taken 2 / 5 day courses of antibiotics and 1/ 7 day course , is experiencing hallucinations at night seeing people and different things happen in her room  Still some frequent urination and dysuria Dysuria - Plan: POCT URINALYSIS DIP (CLINITEK), Urine Culture  Frequent UTI  Frequent falls  Hallucinations The hallucinations are sometimes she sees small children sometimes adults she denies any fear of being hurt Denies being depressed.  She is on some medicines one of them could cause this  Review of Systems     Objective:   Physical Exam  General-in no acute distress Eyes-no discharge Lungs-respiratory rate normal, CTA CV-no murmurs,RRR Extremities skin warm dry no edema Neuro grossly normal Behavior normal, alert       Assessment & Plan:   1. Dysuria Hold on antibiotic till cx and sens is back - POCT URINALYSIS DIP (CLINITEK) - Urine Culture  2. Frequent UTI Adequate fluids recommended  3. Frequent falls Use walker, pt has done PT in past  4. Hallucinations Could be related to metoprolol HR sounds NSR and rate controlled Stop metoprolol  Arhtritis in back and hips recommend tylenol prn avoid NSAIDS May use hydrocodone sparingly  Recheck hallucinations and heart rate rythym on follow up

## 2022-05-24 NOTE — Patient Instructions (Signed)
Stop metoprolol Recheck here in 2 to 3 weeks

## 2022-05-25 ENCOUNTER — Encounter: Payer: Self-pay | Admitting: *Deleted

## 2022-05-25 ENCOUNTER — Ambulatory Visit: Payer: Self-pay | Admitting: *Deleted

## 2022-05-25 NOTE — Patient Instructions (Signed)
Visit Information  Thank you for taking time to visit with me today. Please don't hesitate to contact me if I can be of assistance to you.   Following are the goals we discussed today:   Goals Addressed               This Visit's Progress     Reduce and Manage Symptoms of Anxiety and Depression. (pt-stated)   On track     Care Coordination Interventions:  Active Listening/Reflection Utilized.  Verbalization of Feelings Encouraged. Emotional Support Provided. Deep Breathing Exercises, Relaxation Techniques & Mindfulness Meditation Strategies Encouraged Daily. Task-Centered Strategies Employed. Brief Solution-Focused Therapy Initiated. Cognitive Behavioral Therapy Performed.  Continue to [place], Every Monday & Wednesday for 4 Hours, to Assist with Light Housekeeping Duties, Meal Preparation, Grocery Shopping, Laundry, Bathing, [employer], Running Errands, [employer], [place]. Per Office Visit with Primary Care Provider, Dr. Sallee Lange, [facility]ook [facility] on 05/24/2022, Please Adhere to All of the Following Recommendations for Dysuria: ~ Hold On Antibiotics. ~ Await Results of Clinitek Urinalysis Dip. ~ Await Results of Urine Culture. ~ Obtain Adequate Fluid Intake.  ~ Stop Prescription Medication Metoprolol           Our next appointment is by telephone on 06/08/2022 at 11:15 am.  Please call the care guide team at [phone] if you need to cancel or reschedule your appointment.   If you are experiencing a Mental Health or [place] or need someone to talk to, please call the Suicide and Crisis Lifeline: 988 call the [place] National Suicide Prevention Lifeline: (865)306-8143 or TTY: (971) 836-4618 TTY 7348623648) to talk to a trained counselor call 1-800-273-TALK (toll free, 24 hour hotline) go to [facility] Urgent Care [address] [place] [phone]) call the [place]: [phone] call 911  Patient verbalizes understanding of instructions and care plan provided today and agrees to view in [place]. Active MyChart status and patient understanding of how to access instructions and care plan via MyChart confirmed with patient.     Telephone follow up appointment with care management team member scheduled for:  06/08/2022 at 11:15 am.    Nat Christen, BSW, MSW, [place]  Licensed Clinical Social Worker  [place]  Mailing [place]. [address] [place], [place] [address] Physical Address-[address] [place], [place] [address] Toll Free Main # [phone] Fax # [phone] Cell # [phone] [email or web address]

## 2022-05-25 NOTE — Patient Outreach (Signed)
  Care Coordination   Follow Up Visit Note   05/25/2022  Name: [name] MRN: [id number] DOB: 01-Jun-1923  [name] is a 89 y.o. year old female who sees Luking, Elayne Snare, MD for primary care. I spoke with [name] by phone today.  What matters to the patients health and wellness today?  Reduce and Manage Symptoms of Anxiety and Depression.   Goals Addressed               This Visit's Progress     Reduce and Manage Symptoms of Anxiety and Depression. (pt-stated)   On track     Care Coordination Interventions:  Active Listening/Reflection Utilized.  Verbalization of Feelings Encouraged. Emotional Support Provided. Deep Breathing Exercises, Relaxation Techniques & Mindfulness Meditation Strategies Encouraged Daily. Task-Centered Strategies Employed. Brief Solution-Focused Therapy Initiated. Cognitive Behavioral Therapy Performed.  Continue to [place], Every Monday & Wednesday for 4 Hours, to Assist with Light Housekeeping Duties, Meal Preparation, Grocery Shopping, Laundry, Bathing, [employer], Running Errands, [employer], [place]. Per Office Visit with Primary Care Provider, Dr. Sallee Lange, [facility] [address]ook [address] on 05/24/2022, Please Adhere to All of the Following Recommendations for Dysuria: ~ Hold On Antibiotics. ~ Await Results of Clinitek Urinalysis Dip. ~ Await Results of Urine Culture. ~ Obtain Adequate Fluid Intake.  ~ Stop Prescription Medication Metoprolol           SDOH assessments and interventions completed:  Yes.  Care Coordination Interventions:  Yes, provided.   Follow up plan: Follow up call scheduled for 06/08/2022 at 11:15 am.  Encounter Outcome:  Pt. Visit Completed.   Nat Christen, BSW, MSW, LCSW  Licensed Education officer, environmental Health System  Mailing [place] [address]. [address] [place], [place] [address] Physical Address-[address]  [place], [place] [address] Toll Free Main # [phone] Fax # [phone] Cell # [phone] [email or web address]

## 2022-05-26 LAB — URINE CULTURE

## 2022-06-01 ENCOUNTER — Other Ambulatory Visit: Payer: Self-pay | Admitting: Family Medicine

## 2022-06-08 ENCOUNTER — Encounter: Payer: Self-pay | Admitting: *Deleted

## 2022-06-08 ENCOUNTER — Ambulatory Visit: Payer: Self-pay | Admitting: *Deleted

## 2022-06-08 NOTE — Patient Outreach (Signed)
  Care Coordination   Follow Up Visit Note   06/08/2022  Name: [name] MRN: [id number] DOB: [birth year]  [name] is a 89 y.o. year old female who sees Luking, Elayne Snare, MD for primary care. I spoke with [name] by phone today.  What matters to the patients health and wellness today?  Reduce and Manage Symptoms of Anxiety and Depression.    Goals Addressed               This Visit's Progress     Reduce and Manage Symptoms of Anxiety and Depression. (pt-stated)   On track     Care Coordination Interventions:  Problem-Solving Strategies Activated. Solution-Focused Interventions Implemented. Task-Centered Activities Employed. Active Listening & Reflection Utilized.  Verbalization of Feelings Encouraged. Emotional Support Provided. Cognitive Behavioral Therapy Initiated.  Client-Centered Therapy Performed. Continue to [place], Every Monday & Wednesday for 4 Hours, to Assist with Light Housekeeping Duties, Meal Preparation, Grocery Shopping, Laundry, Bathing, [employer], Running Errands, [employer], [place]. Please Keep Follow-Up Appointment with Primary Care Provider, Dr. Sallee Lange with [place] [phone]), Scheduled on 06/16/2022 at 9:00 AM.           SDOH assessments and interventions completed:  Yes.  Care Coordination Interventions:  Yes, provided.   Follow up plan: Follow up call scheduled for 06/29/2022 at 10:30 am.  Encounter Outcome:  Pt. Visit Completed.   Nat Christen, BSW, MSW, LCSW  Licensed Education officer, environmental Health System  Mailing [place] [address]. [address] [place], [place] [address] Physical Address-[address] [place], [place] [address] Toll Free Main # [phone] Fax # [phone] Cell # [phone] Di [email or web address]

## 2022-06-08 NOTE — Patient Instructions (Signed)
Visit Information  Thank you for taking time to visit with me today. Please don't hesitate to contact me if I can be of assistance to you.   Following are the goals we discussed today:   Goals Addressed               This Visit's Progress     Reduce and Manage Symptoms of Anxiety and Depression. (pt-stated)   On track     Care Coordination Interventions:  Problem-Solving Strategies Activated. Solution-Focused Interventions Implemented. Task-Centered Activities Employed. Active Listening & Reflection Utilized.  Verbalization of Feelings Encouraged. Emotional Support Provided. Cognitive Behavioral Therapy Initiated.  Client-Centered Therapy Performed. Continue to [place], Every Monday & Wednesday for 4 Hours, to Assist with Light Housekeeping Duties, Meal Preparation, Grocery Shopping, Laundry, Bathing, [employer], Running Errands, [employer], [place]. Please Keep Follow-Up Appointment with Primary Care Provider, Dr. Sallee Lange with [place] [phone]), Scheduled on 06/16/2022 at 9:00 AM.           Our next appointment is by telephone on 06/29/2022 at 10:30 am.  Please call the care guide team at [phone] if you need to cancel or reschedule your appointment.   If you are experiencing a Mental Health or [place] or need someone to talk to, please call the Suicide and Crisis Lifeline: 988 call the [place] National Suicide Prevention Lifeline: 251-211-1057 or TTY: (610)457-7083 TTY 878-366-0537) to talk to a trained counselor call 1-800-273-TALK (toll free, 24 hour hotline) go to [facility] Urgent Care [address], [place] [phone]) call the [place]: [phone] call 911  Patient verbalizes understanding of instructions and care plan provided today and agrees to view in [place]. Active MyChart status and patient understanding of how to  access instructions and care plan via MyChart confirmed with patient.     Telephone follow up appointment with care management team member scheduled for:  06/29/2022 at 10:30 am.  Nat Christen, BSW, MSW, [place]  Licensed Clinical Social Worker  [place]  Mailing [place]. [address], [place], [place] [address] Physical Address-[address], [place], [place] [address] Toll Free Main # [phone] Fax # [phone] Cell # [phone] [email or web address]

## 2022-06-10 DIAGNOSIS — K080 Exfoliation of teeth due to systemic causes: Secondary | ICD-10-CM | POA: Diagnosis not present

## 2022-06-16 ENCOUNTER — Ambulatory Visit (INDEPENDENT_AMBULATORY_CARE_PROVIDER_SITE_OTHER): Payer: Medicare Other | Admitting: Family Medicine

## 2022-06-16 VITALS — BP 136/70 | HR 66 | Temp 99.1°F | Wt 145.6 lb

## 2022-06-16 DIAGNOSIS — N1832 Chronic kidney disease, stage 3b: Secondary | ICD-10-CM

## 2022-06-16 DIAGNOSIS — D509 Iron deficiency anemia, unspecified: Secondary | ICD-10-CM | POA: Diagnosis not present

## 2022-06-16 DIAGNOSIS — Z961 Presence of intraocular lens: Secondary | ICD-10-CM | POA: Diagnosis not present

## 2022-06-16 DIAGNOSIS — I480 Paroxysmal atrial fibrillation: Secondary | ICD-10-CM | POA: Diagnosis not present

## 2022-06-16 DIAGNOSIS — H526 Other disorders of refraction: Secondary | ICD-10-CM | POA: Diagnosis not present

## 2022-06-16 DIAGNOSIS — E038 Other specified hypothyroidism: Secondary | ICD-10-CM

## 2022-06-16 DIAGNOSIS — H353133 Nonexudative age-related macular degeneration, bilateral, advanced atrophic without subfoveal involvement: Secondary | ICD-10-CM | POA: Diagnosis not present

## 2022-06-16 MED ORDER — HYDROCODONE-ACETAMINOPHEN 5-325 MG PO TABS: 1.0000 | ORAL_TABLET | Freq: Four times a day (QID) | ORAL | 0 refills | Status: DC | PRN

## 2022-06-16 NOTE — Progress Notes (Signed)
   Subjective:    Patient ID: [name], female    DOB: February 14, 1924, 89 y.o.   MRN: [id number]  HPI Patient arrives for 3 week follow up. Medication check up  Patient has issues with vision. This patient was seen today for chronic pain  The medication list was reviewed and updated.  Location of Pain for which the patient has been treated with regarding narcotics: Aching pain discomfort primarily in her major joints hips and knees Due to underlying health issues including chronic kidney disease cannot take anti-inflammatories Tylenol in the past did not do enough We allow for limited pain medicine for occasional use caution drowsiness  Onset of this pain: Present for years   -Compliance with medication: Good compliance  - Number patient states they take daily: Less than 1  -when was the last dose patient took?  Several days ago  The patient was advised the importance of maintaining medication and not using illegal substances with these.  Here for refills and follow up  The patient was educated that we can provide 3 monthly scripts for their medication, it is their responsibility to follow the instructions.  Side effects or complications from medications: Denies side effects  Patient is aware that pain medications are meant to minimize the severity of the pain to allow their pain levels to improve to allow for better function. They are aware of that pain medications cannot totally remove their pain.  Due for UDT ( at least once per year) : None  Scale of 1 to 10 ( 1 is least 10 is most) Your pain level without the medicine: 8 Your pain level with medication 4  Scale 1 to 10 ( 1-helps very little, 10 helps very well) How well does your pain medication reduce your pain so you can function better through out the day? 7  Quality of the pain: Aching  Persistence of the pain: Comes and goes but generally most days persistent some days worse than others  Modifying factors:  Worse with activity  Iron deficiency anemia, unspecified iron deficiency anemia type - Plan: Basic metabolic panel, CBC with Differential  Stage 3b chronic kidney disease (HCC)  Paroxysmal atrial fibrillation (HCC)       Review of Systems     Objective:   Physical Exam General-in no acute distress Eyes-no discharge Lungs-respiratory rate normal, CTA CV-no murmurs,RRR Extremities skin warm dry no edema Neuro grossly normal Behavior normal, alert Currently heart rate is normal and regular       Assessment & Plan:   1. Iron deficiency anemia, unspecified iron deficiency anemia type Check CBC because a history of anemia - Basic metabolic panel - CBC with Differential  2. Stage 3b chronic kidney disease ([place]) Check kidney function avoid NSAIDs stay active and stay hydrated  3. Paroxysmal atrial fibrillation (HCC) Recently stopped metoprolol because it was causing hallucinations avoid this for now  4. Other specified hypothyroidism Continue thyroid medicine  Chronic pain related to arthritis limited number of hydrocodone was sent in Patient to follow-up in approximately 3 months labs before that visit

## 2022-06-29 ENCOUNTER — Encounter: Payer: Self-pay | Admitting: *Deleted

## 2022-06-29 ENCOUNTER — Ambulatory Visit: Payer: Self-pay | Admitting: *Deleted

## 2022-06-29 NOTE — Patient Instructions (Signed)
Visit Information  Thank you for taking time to visit with me today. Please don't hesitate to contact me if I can be of assistance to you.   Following are the goals we discussed today:   Goals Addressed               This Visit's Progress     COMPLETED: Reduce and Manage Symptoms of Anxiety and Depression. (pt-stated)   On track     Care Coordination Interventions:  Active Listening & Reflection Utilized.  Verbalization of Feelings Encouraged. Emotional Support Provided. Feelings of Frustration & Anxiety Validated. Problem-Solving Strategies Revised. Solution-Focused Interventions Activated. Task-Centered Activities Implemented. Acceptance & Commitment Therapy Introduced. Brief Cognitive Behavioral Therapy Performed.  Client-Centered Therapy Initiated. Continue to [place], Every Monday & Wednesday for 4 Hours, to Assist with Light Housekeeping Duties, Meal Preparation, Grocery Shopping, Laundry, Bathing, [employer], Running Errands, [employer], [place]. Please Contact CSW Directly (# [phone]), if You Have Questions, Need Assistance, or If Additional Social Work Needs Are Identified in The Near Future.           Please call the care guide team at [phone] if you need to cancel or reschedule your appointment.   If you are experiencing a Mental Health or [place] or need someone to talk to, please call the Suicide and Crisis Lifeline: 988 call the [place] National Suicide Prevention Lifeline: 3216409259 or TTY: 506-094-1443 TTY (534) 669-8654) to talk to a trained counselor call 1-800-273-TALK (toll free, 24 hour hotline) go to [facility] Urgent Care [address], [place] [phone]) call the [place]: [phone] call 911  Patient verbalizes understanding of instructions and care plan provided today and agrees to view in [place]. Active MyChart status and  patient understanding of how to access instructions and care plan via MyChart confirmed with patient.     No further follow up required.  Nat Christen, BSW, MSW, LCSW  Licensed Education officer, environmental Health System  Mailing [place] N. [address] [place], [place] [address] Physical Address-[address] [place], [place] [address] Toll Free Main # [phone] Fax # [phone] Cell # [phone] [email or web address]

## 2022-06-29 NOTE — Patient Outreach (Signed)
  Care Coordination   Follow Up Visit Note   06/29/2022  Name: [name] MRN: [id number] DOB: 23-Apr-1924  [name] is a 89 y.o. year old female who sees Luking, Elayne Snare, MD for primary care. I spoke with [name] by phone today.  What matters to the patients health and wellness today?   Reduce and Manage Symptoms of Anxiety and Depression.   Goals Addressed               This Visit's Progress     COMPLETED: Reduce and Manage Symptoms of Anxiety and Depression. (pt-stated)   On track     Care Coordination Interventions:  Active Listening & Reflection Utilized.  Verbalization of Feelings Encouraged. Emotional Support Provided. Feelings of Frustration & Anxiety Validated. Problem-Solving Strategies Revised. Solution-Focused Interventions Activated. Task-Centered Activities Implemented. Acceptance & Commitment Therapy Introduced. Brief Cognitive Behavioral Therapy Performed.  Client-Centered Therapy Initiated. Continue to [place], Every Monday & Wednesday for 4 Hours, to Assist with Light Housekeeping Duties, Meal Preparation, Grocery Shopping, Laundry, Bathing, [employer], Running Errands, [employer], [place]. Please Contact CSW Directly (# [phone]), if You Have Questions, Need Assistance, or If Additional Social Work Needs Are Identified in The Near Future.           SDOH assessments and interventions completed:  Yes.  Care Coordination Interventions:  Yes, provided.   Follow up plan: No further intervention required.   Encounter Outcome:  Pt. Visit Completed.   Nat Christen, BSW, MSW, LCSW  Licensed Education officer, [facility]  Mailing [place] [address] [place], [place] [address] Physical Address-[address] [place], [place] [address] Toll Free Main # [phone] Fax # [phone] Cell #  [phone] [email or web address]

## 2022-07-01 ENCOUNTER — Other Ambulatory Visit: Payer: Self-pay | Admitting: Family Medicine

## 2022-07-06 ENCOUNTER — Ambulatory Visit: Payer: Medicare Other | Admitting: Family Medicine

## 2022-07-19 ENCOUNTER — Other Ambulatory Visit: Payer: Self-pay | Admitting: Family Medicine

## 2022-07-21 ENCOUNTER — Encounter: Payer: Self-pay | Admitting: Radiology

## 2022-08-01 ENCOUNTER — Other Ambulatory Visit: Payer: Self-pay

## 2022-08-01 ENCOUNTER — Emergency Department (HOSPITAL_COMMUNITY): Payer: Medicare Other

## 2022-08-01 ENCOUNTER — Emergency Department (HOSPITAL_COMMUNITY)
Admission: EM | Admit: 2022-08-01 | Discharge: 2022-08-01 | Disposition: A | Discharge status: Home / Self Care | Payer: Medicare Other | Attending: Emergency Medicine | Admitting: Emergency Medicine

## 2022-08-01 ENCOUNTER — Encounter (HOSPITAL_COMMUNITY): Payer: Self-pay

## 2022-08-01 DIAGNOSIS — Z853 Personal history of malignant neoplasm of breast: Secondary | ICD-10-CM | POA: Insufficient documentation

## 2022-08-01 DIAGNOSIS — R410 Disorientation, unspecified: Secondary | ICD-10-CM | POA: Diagnosis not present

## 2022-08-01 DIAGNOSIS — Z1152 Encounter for screening for COVID-19: Secondary | ICD-10-CM | POA: Diagnosis not present

## 2022-08-01 DIAGNOSIS — E039 Hypothyroidism, unspecified: Secondary | ICD-10-CM | POA: Diagnosis not present

## 2022-08-01 DIAGNOSIS — R441 Visual hallucinations: Secondary | ICD-10-CM | POA: Diagnosis not present

## 2022-08-01 DIAGNOSIS — R443 Hallucinations, unspecified: Secondary | ICD-10-CM | POA: Diagnosis not present

## 2022-08-01 DIAGNOSIS — N189 Chronic kidney disease, unspecified: Secondary | ICD-10-CM | POA: Diagnosis not present

## 2022-08-01 DIAGNOSIS — J189 Pneumonia, unspecified organism: Secondary | ICD-10-CM | POA: Diagnosis not present

## 2022-08-01 DIAGNOSIS — N390 Urinary tract infection, site not specified: Secondary | ICD-10-CM | POA: Diagnosis not present

## 2022-08-01 DIAGNOSIS — R4182 Altered mental status, unspecified: Secondary | ICD-10-CM | POA: Diagnosis not present

## 2022-08-01 DIAGNOSIS — J479 Bronchiectasis, uncomplicated: Secondary | ICD-10-CM | POA: Insufficient documentation

## 2022-08-01 DIAGNOSIS — R519 Headache, unspecified: Secondary | ICD-10-CM | POA: Diagnosis not present

## 2022-08-01 DIAGNOSIS — I10 Essential (primary) hypertension: Secondary | ICD-10-CM | POA: Diagnosis not present

## 2022-08-01 DIAGNOSIS — R442 Other hallucinations: Secondary | ICD-10-CM | POA: Diagnosis not present

## 2022-08-01 DIAGNOSIS — I129 Hypertensive chronic kidney disease with stage 1 through stage 4 chronic kidney disease, or unspecified chronic kidney disease: Secondary | ICD-10-CM | POA: Insufficient documentation

## 2022-08-01 DIAGNOSIS — R0902 Hypoxemia: Secondary | ICD-10-CM | POA: Diagnosis not present

## 2022-08-01 DIAGNOSIS — J9811 Atelectasis: Secondary | ICD-10-CM | POA: Diagnosis not present

## 2022-08-01 LAB — COMPREHENSIVE METABOLIC PANEL
ALT: 14 U/L (ref 0–44)
AST: 25 U/L (ref 15–41)
Albumin: 3.9 g/dL (ref 3.5–5.0)
Alkaline Phosphatase: 67 U/L (ref 38–126)
Anion gap: 11 (ref 5–15)
BUN: 27 mg/dL — ABNORMAL HIGH (ref 8–23)
CO2: 26 mmol/L (ref 22–32)
Calcium: 8.7 mg/dL — ABNORMAL LOW (ref 8.9–10.3)
Chloride: 101 mmol/L (ref 98–111)
Creatinine, Ser: 1.53 mg/dL — ABNORMAL HIGH (ref 0.44–1.00)
GFR, Estimated: 31 mL/min — ABNORMAL LOW (ref >60)
Glucose, Bld: 127 mg/dL — ABNORMAL HIGH (ref 70–99)
Potassium: 3.7 mmol/L (ref 3.5–5.1)
Sodium: 138 mmol/L (ref 135–145)
Total Bilirubin: 0.7 mg/dL (ref 0.3–1.2)
Total Protein: 6.9 g/dL (ref 6.5–8.1)

## 2022-08-01 LAB — CBC WITH DIFFERENTIAL/PLATELET
Abs Immature Granulocytes: 0.03 10*3/uL (ref 0.00–0.07)
Basophils Absolute: 0 10*3/uL (ref 0.0–0.1)
Basophils Relative: 0 %
Eosinophils Absolute: 0.2 10*3/uL (ref 0.0–0.5)
Eosinophils Relative: 3 %
HCT: 36 % (ref 36.0–46.0)
Hemoglobin: 11.7 g/dL — ABNORMAL LOW (ref 12.0–15.0)
Immature Granulocytes: 0 %
Lymphocytes Relative: 22 %
Lymphs Abs: 1.5 10*3/uL (ref 0.7–4.0)
MCH: 29.2 pg (ref 26.0–34.0)
MCHC: 32.5 g/dL (ref 30.0–36.0)
MCV: 89.8 fL (ref 80.0–100.0)
Monocytes Absolute: 0.5 10*3/uL (ref 0.1–1.0)
Monocytes Relative: 8 %
Neutro Abs: 4.5 10*3/uL (ref 1.7–7.7)
Neutrophils Relative %: 67 %
Platelets: 229 10*3/uL (ref 150–400)
RBC: 4.01 MIL/uL (ref 3.87–5.11)
RDW: 12.8 % (ref 11.5–15.5)
WBC: 6.8 10*3/uL (ref 4.0–10.5)
nRBC: 0 % (ref 0.0–0.2)

## 2022-08-01 LAB — URINALYSIS, ROUTINE W REFLEX MICROSCOPIC
Bacteria, UA: NONE SEEN
Bilirubin Urine: NEGATIVE
Glucose, UA: NEGATIVE mg/dL
Hgb urine dipstick: NEGATIVE
Ketones, ur: NEGATIVE mg/dL
Nitrite: NEGATIVE
Protein, ur: NEGATIVE mg/dL
Specific Gravity, Urine: 1.01 (ref 1.005–1.030)
pH: 6 (ref 5.0–8.0)

## 2022-08-01 LAB — TSH: TSH: 1.866 u[IU]/mL (ref 0.350–4.500)

## 2022-08-01 LAB — RESP PANEL BY RT-PCR (RSV, FLU A&B, COVID)  RVPGX2
Influenza A by PCR: NEGATIVE
Influenza B by PCR: NEGATIVE
Resp Syncytial Virus by PCR: NEGATIVE
SARS Coronavirus 2 by RT PCR: NEGATIVE

## 2022-08-01 MED ORDER — PROPYLENE GLYCOL 0.6 % OP SOLN: Freq: Every day | OPHTHALMIC | Status: DC | PRN

## 2022-08-01 MED ORDER — LEVOTHYROXINE SODIUM 50 MCG PO TABS: 75.0000 ug | ORAL_TABLET | Freq: Every day | ORAL | Status: DC

## 2022-08-01 MED ORDER — POLYVINYL ALCOHOL 1.4 % OP SOLN: 1.0000 [drp] | OPHTHALMIC | Status: DC | PRN

## 2022-08-01 MED ORDER — ACETAMINOPHEN ER 650 MG PO TBCR: 650.0000 mg | EXTENDED_RELEASE_TABLET | Freq: Three times a day (TID) | ORAL | Status: DC

## 2022-08-01 MED ORDER — TORSEMIDE 20 MG PO TABS
20.0000 mg | ORAL_TABLET | ORAL | Status: DC
  Filled 2022-08-01: qty 1

## 2022-08-01 MED ORDER — ADULT MULTIVITAMIN W/MINERALS CH
1.0000 | ORAL_TABLET | Freq: Every day | ORAL | Status: DC
  Filled 2022-08-01: qty 1

## 2022-08-01 MED ORDER — HYDROCODONE-ACETAMINOPHEN 5-325 MG PO TABS
1.0000 | ORAL_TABLET | Freq: Four times a day (QID) | ORAL | Status: DC | PRN
  Administered 2022-08-01: 1 via ORAL
  Filled 2022-08-01: qty 1

## 2022-08-01 MED ORDER — DILTIAZEM HCL ER COATED BEADS 120 MG PO CP24
120.0000 mg | ORAL_CAPSULE | Freq: Every day | ORAL | Status: DC
  Filled 2022-08-01: qty 1

## 2022-08-01 MED ORDER — LISINOPRIL 5 MG PO TABS
2.5000 mg | ORAL_TABLET | Freq: Every day | ORAL | Status: DC
  Filled 2022-08-01: qty 1

## 2022-08-01 MED ORDER — TROLAMINE SALICYLATE 10 % EX CREA: 1.0000 | TOPICAL_CREAM | CUTANEOUS | Status: DC | PRN

## 2022-08-01 MED ORDER — ALLOPURINOL 100 MG PO TABS: 50.0000 mg | ORAL_TABLET | Freq: Every day | ORAL | Status: DC

## 2022-08-01 MED ORDER — POTASSIUM CHLORIDE CRYS ER 10 MEQ PO TBCR
10.0000 meq | EXTENDED_RELEASE_TABLET | Freq: Every day | ORAL | Status: DC
  Administered 2022-08-01: 10 meq via ORAL
  Filled 2022-08-01: qty 1

## 2022-08-01 MED ORDER — PANTOPRAZOLE SODIUM 40 MG PO TBEC
40.0000 mg | DELAYED_RELEASE_TABLET | Freq: Every day | ORAL | Status: DC
  Filled 2022-08-01: qty 1

## 2022-08-01 MED ORDER — SERTRALINE HCL 50 MG PO TABS
50.0000 mg | ORAL_TABLET | Freq: Every day | ORAL | Status: DC
  Filled 2022-08-01: qty 1

## 2022-08-01 MED ORDER — POLYETHYLENE GLYCOL 3350 17 G PO PACK
17.0000 g | PACK | Freq: Every day | ORAL | Status: DC
  Filled 2022-08-01: qty 1

## 2022-08-01 MED ORDER — ACETAMINOPHEN 325 MG PO TABS: 650.0000 mg | ORAL_TABLET | Freq: Two times a day (BID) | ORAL | Status: DC

## 2022-08-01 NOTE — ED Notes (Signed)
Patient complains of lower abd pain again and feeling like she needs to have BM.  Tech notified to take bedside commode in.  MD notified of lump in location of where bladder/uterus should be.

## 2022-08-01 NOTE — ED Notes (Signed)
Pt gone to CT. Will attempt IV when pt comes back

## 2022-08-01 NOTE — ED Notes (Addendum)
Patient up to recliner for comfort. Complains of back pain and soreness in abd.  MD notified patient reports she hasn't taken her hydrocodone today.
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
## 2022-08-01 NOTE — ED Notes (Signed)
Patient sitting in recliner.  Declined watching TV call bell within reach.

## 2022-08-01 NOTE — Discharge Instructions (Signed)
You were seen in the emergency department for possible confusion and hallucinations.  You were offered an evaluation by our behavioral health team but were unwilling to stay for that.  Please follow-up with your primary care doctor so they can readdress this with you.  Return to the emergency department if any worsening or concerning symptoms.

## 2022-08-01 NOTE — ED Notes (Signed)
Patient requesting to go home with son.  MD notified and will be speaking with patient.

## 2022-08-01 NOTE — ED Provider Notes (Signed)
[facility] Provider Note   CSN: [id number] Arrival date & time: 08/01/22  1003     History  Chief Complaint  Patient presents with   Altered Mental Status    [name] is a 89 y.o. female.  Pt is a 89 yo female with pmhx significant for htn, afib (not on thinners due to hx of ICH + SAH), GERD, HLD, osteoporosis, breast cancer, ovarian cysts, CKD, and hypothyroidism.  Pt lives alone.  A neighbor went to check on her and noted that she was a little confused.  The neighbor noticed that pt spray painted her TV.  I asked pt about this.  She said there was a girl in her house (pt lives alone) who spray painted it.  She is scared b/c she is worried about the girl living there and spray painting in her house.  She denies any pain.  She is otherwise conversant.  I had the hiccups while examining her and she gave me her home remedy of saying "COW!"  (It worked.)  Pt reports she's been having these hallucinations for 2 or more months. She thinks it's because of her meds.       Home Medications Prior to Admission medications   Medication Sig Start Date End Date Taking? Authorizing Provider  allopurinol (ZYLOPRIM) 100 MG tablet TAKE (1/2) TABLET BY MOUTH DAILY. 02/02/22  Yes [name], MD  diltiazem (CARDIZEM CD) 120 MG 24 hr capsule Take 1 capsule (120 mg total) by mouth daily. 06/01/22  Yes Luking, Elayne Snare, MD  HYDROcodone-acetaminophen (NORCO/VICODIN) 5-325 MG tablet TAKE 1 TABLET BY MOUTH EVERY 6 HOURS AS NEEDED. 07/19/22  Yes Luking, Elayne Snare, MD  levothyroxine (SYNTHROID) 75 MCG tablet Take 1 tablet (75 mcg total) by mouth daily. 05/06/22  Yes Luking, Elayne Snare, MD  lisinopril (ZESTRIL) 2.5 MG tablet Take 1 tablet (2.5 mg total) by mouth daily. 04/01/22  Yes [name], MD  Multiple Vitamin (MULTIVITAMIN WITH MINERALS) TABS tablet Take 1 tablet by mouth daily.   Yes [provider]  oxymetazoline (AFRIN) 0.05 % nasal spray  Place 1 spray into both nostrils 2 (two) times daily.   Yes [provider]  pantoprazole (PROTONIX) 40 MG tablet TAKE 1 TABLET BY MOUTH ONCE DAILY. 07/04/22  Yes Luking, Elayne Snare, MD  polyethylene glycol powder (GLYCOLAX/MIRALAX) 17 GM/SCOOP powder Take 17 g by mouth daily.   Yes [provider]  potassium chloride (KLOR-CON) 10 MEQ tablet TAKE ONE TABLET BY MOUTH ONCE DAILY. 07/04/22  Yes [name], MD  Propylene Glycol (SYSTANE COMPLETE OP) Apply 1 drop to eye daily as needed (dryness).   Yes [provider]  QC ACETAMINOPHEN 8 HOURS 650 MG CR tablet Take 1 tablet by mouth in the morning and at bedtime. 10/23/19  Yes [provider]  sertraline (ZOLOFT) 50 MG tablet TAKE 1 TABLET BY MOUTH ONCE A DAY. 07/04/22  Yes Luking, Scott A, MD  torsemide (DEMADEX) 20 MG tablet TAKE (1) TABLET BY MOUTH EVERY OTHER DAY. 05/06/22  Yes [name], MD  trolamine salicylate (ASPERCREME) 10 % cream Apply 1 application topically as needed (feet and shoulders).   Yes [provider]  carbamide peroxide (DEBROX) 6.5 % OTIC solution Place 5 drops into both ears 2 (two) times daily as needed (as needed for ear wax build up). Patient not taking: Reported on 04/06/2022 08/31/20   Scot Jun, NP  ondansetron (ZOFRAN) 4 MG  tablet Take 1 tablet (4 mg total) by mouth every 8 (eight) hours as needed for nausea or vomiting. Patient not taking: Reported on 08/01/2022 12/17/21   Coral Spikes, DO  ondansetron (ZOFRAN-ODT) 4 MG disintegrating tablet Take 1 tablet (4 mg total) by mouth every 8 (eight) hours as needed for up to 10 doses for nausea or vomiting. Patient not taking: Reported on 08/01/2022 11/05/21   Luna Fuse, MD  vitamin B-12 1000 MCG tablet Take 1 tablet (1,000 mcg total) by mouth daily. Patient not taking: Reported on 08/01/2022 07/21/17   Mary Sella, NP      Allergies    Amiodarone, Cephalosporins, Meloxicam, Robaxin [methocarbamol], Amoxicillin,  Lovenox [enoxaparin sodium], Morphine, Morphine and related, Penicillins, Sulfa antibiotics, Lipitor [atorvastatin], Lorazepam, Macrobid [nitrofurantoin], and Metoprolol    Review of Systems   Review of Systems  Psychiatric/Behavioral:  Positive for hallucinations.   All other systems reviewed and are negative.   Physical Exam Updated Vital Signs BP (!) 147/55   Pulse 66   Temp 98.6 F (37 C) (Oral)   Resp 19   Ht '5\' 4"'$  (1.626 m)   Wt 65.8 kg   SpO2 93%   BMI 24.89 kg/m  Physical Exam Vitals and nursing note reviewed.  Constitutional:      Appearance: Normal appearance.  HENT:     Head: Normocephalic and atraumatic.     Right Ear: External ear normal.     Left Ear: External ear normal.     Nose: Nose normal.     Mouth/Throat:     Mouth: Mucous membranes are moist.     Pharynx: Oropharynx is clear.  Eyes:     Extraocular Movements: Extraocular movements intact.     Conjunctiva/sclera: Conjunctivae normal.     Pupils: Pupils are equal, round, and reactive to light.  Cardiovascular:     Rate and Rhythm: Normal rate and regular rhythm.     Pulses: Normal pulses.     Heart sounds: Normal heart sounds.  Pulmonary:     Effort: Pulmonary effort is normal.     Breath sounds: Normal breath sounds.  Abdominal:     General: Abdomen is flat. Bowel sounds are normal.     Palpations: Abdomen is soft.  Musculoskeletal:        General: Normal range of motion.     Cervical back: Normal range of motion and neck supple.  Skin:    General: Skin is warm.     Capillary Refill: Capillary refill takes less than 2 seconds.  Neurological:     General: No focal deficit present.     Mental Status: She is alert and oriented to person, place, and time.  Psychiatric:        Attention and Perception: She perceives visual hallucinations.        Mood and Affect: Mood normal.     ED Results / Procedures / Treatments   Labs (all labs ordered are listed, but only abnormal results are  displayed) Labs Reviewed  CBC WITH DIFFERENTIAL/PLATELET - Abnormal; Notable for the following components:      Result Value   Hemoglobin 11.7 (*)    All other components within normal limits  COMPREHENSIVE METABOLIC PANEL - Abnormal; Notable for the following components:   Glucose, Bld 127 (*)    BUN 27 (*)    Creatinine, Ser 1.53 (*)    Calcium 8.7 (*)    GFR, Estimated 31 (*)    All other components within  normal limits  URINALYSIS, ROUTINE W REFLEX MICROSCOPIC - Abnormal; Notable for the following components:   Leukocytes,Ua SMALL (*)    All other components within normal limits  RESP PANEL BY RT-PCR (RSV, FLU A&B, COVID)  RVPGX2  TSH    EKG EKG Interpretation  Date/Time:  Monday August 01 2022 10:18:50 EDT Ventricular Rate:  70 PR Interval:  212 QRS Duration: 92 QT Interval:  423 QTC Calculation: 457 R Axis:   -28 Text Interpretation: Sinus rhythm Borderline prolonged PR interval Borderline left axis deviation Nonspecific T abnormalities, lateral leads Baseline wander in lead(s) V2 No significant change since last tracing Confirmed by Isla Pence [phone]) on 08/01/2022 10:23:46 AM  Radiology CT Chest Wo Contrast  Result Date: 08/01/2022 CLINICAL DATA:  Pneumonia. EXAM: CT CHEST WITHOUT CONTRAST TECHNIQUE: Multidetector CT imaging of the chest was performed following the standard protocol without IV contrast. RADIATION DOSE REDUCTION: This exam was performed according to the departmental dose-optimization program which includes automated exposure control, adjustment of the mA and/or kV according to patient size and/or use of iterative reconstruction technique. COMPARISON:  Chest CTA 12/07/2016. FINDINGS: Cardiovascular: The heart size is normal. No substantial pericardial effusion. Coronary artery calcification is evident. Moderate atherosclerotic calcification is noted in the wall of the thoracic aorta. Mediastinum/Nodes: No mediastinal lymphadenopathy. No evidence for gross  hilar lymphadenopathy although assessment is limited by the lack of intravenous contrast on the current study. Tiny hiatal hernia. The esophagus has normal imaging features. There is no axillary lymphadenopathy. Lungs/Pleura: Chronic atelectasis or scarring noted right lower lobe. There is mild cylindrical bronchiectasis with bronchial wall thickening in the lower lungs bilaterally. Dependent atelectasis noted left lung base, similar to prior. No focal airspace consolidation. No suspicious pulmonary nodule or mass. Upper Abdomen: Unremarkable. Musculoskeletal: No worrisome lytic or sclerotic osseous abnormality. IMPRESSION: 1. No acute findings in the chest. 2. Mild cylindrical bronchiectasis with bronchial wall thickening in the lower lungs bilaterally. 3. Chronic atelectasis or scarring in the lower lobes bilaterally, similar to prior. 4. Tiny hiatal hernia. 5.  Aortic Atherosclerosis (ICD10-I70.0). Electronically Signed   By: Misty Stanley M.D.   On: 08/01/2022 12:24   CT Head Wo Contrast  Result Date: 08/01/2022 CLINICAL DATA:  Provided history: Mental status change, unknown cause. Confusion. Pain with urination. EXAM: CT HEAD WITHOUT CONTRAST TECHNIQUE: Contiguous axial images were obtained from the base of the skull through the vertex without intravenous contrast. RADIATION DOSE REDUCTION: This exam was performed according to the departmental dose-optimization program which includes automated exposure control, adjustment of the mA and/or kV according to patient size and/or use of iterative reconstruction technique. COMPARISON:  Head CT 12/16/2021. FINDINGS: Brain: Generalized cerebral atrophy. Mild patchy and ill-defined hypoattenuation within the cerebral white matter, nonspecific but compatible with chronic small vessel ischemic disease. There is no acute intracranial hemorrhage. No demarcated cortical infarct. No extra-axial fluid collection. No evidence of an intracranial mass. No midline shift.  Vascular: No hyperdense vessel. Atherosclerotic calcifications. Skull: No fracture or aggressive osseous lesion. Sinuses/Orbits: No mass or acute finding within the imaged orbits. Minimal mucosal thickening within the bilateral ethmoid, right sphenoid and right maxillary sinuses at the imaged levels. IMPRESSION: 1.  No evidence of an acute intracranial abnormality. 2. Parenchymal atrophy and chronic small vessel disease, as described. Electronically Signed   By: Kellie Simmering D.O.   On: 08/01/2022 11:15   DG Chest Portable 1 View  Result Date: 08/01/2022 CLINICAL DATA:  Provided history: Altered mental status. EXAM: PORTABLE CHEST 1 VIEW  COMPARISON:  Prior chest radiographs 12/16/2021 and earlier. FINDINGS: Heart size within normal limits. Aortic atherosclerosis. Small ill-defined opacity within the medial left lung base. No appreciable airspace consolidation on the right. No sizable pleural effusion or evidence of pneumothorax. No acute bony abnormality identified. Spondylosis and dextrocurvature of the thoracic spine. IMPRESSION: Small ill-defined opacity within the medial left lung base. This may reflect atelectasis. However, pneumonia cannot be excluded. Correlate clinically and consider short-interval radiographic follow-up. Aortic Atherosclerosis (ICD10-I70.0). Electronically Signed   By: Kellie Simmering D.O.   On: 08/01/2022 10:53    Procedures Procedures    Medications Ordered in ED Medications  HYDROcodone-acetaminophen (NORCO/VICODIN) 5-325 MG per tablet 1 tablet (1 tablet Oral Given 08/01/22 1417)  allopurinol (ZYLOPRIM) tablet 50 mg (has no administration in time range)  diltiazem (CARDIZEM CD) 24 hr capsule 120 mg (has no administration in time range)  levothyroxine (SYNTHROID) tablet 75 mcg (has no administration in time range)  lisinopril (ZESTRIL) tablet 2.5 mg (has no administration in time range)  multivitamin with minerals tablet 1 tablet (has no administration in time range)   pantoprazole (PROTONIX) EC tablet 40 mg (has no administration in time range)  polyethylene glycol powder (GLYCOLAX/MIRALAX) container 17 g (has no administration in time range)  potassium chloride (KLOR-CON) CR tablet 10 mEq (has no administration in time range)  Propylene Glycol 0.6 % SOLN (has no administration in time range)  acetaminophen (TYLENOL) CR tablet 650 mg (has no administration in time range)  sertraline (ZOLOFT) tablet 50 mg (has no administration in time range)  torsemide (DEMADEX) tablet 20 mg (has no administration in time range)  trolamine salicylate (ASPERCREME) 10 % cream 1 Application (has no administration in time range)    ED Course/ Medical Decision Making/ A&P                             Medical Decision Making Amount and/or Complexity of Data Reviewed Labs: ordered. Radiology: ordered.  Risk OTC drugs. Prescription drug management.   This patient presents to the ED for concern of ams, this involves an extensive number of treatment options, and is a complaint that carries with it a high risk of complications and morbidity.  The differential diagnosis includes infection, electrolyte abn, head injury   Co morbidities that complicate the patient evaluation  htn, afib (not on thinners due to hx of ICH + SAH), GERD, HLD, osteoporosis, breast cancer, ovarian cysts, CKD, and hypothyroidism   Additional history obtained:  Additional history obtained from epic chart review External records from outside source obtained and reviewed including EMS report   Lab Tests:  I Ordered, and personally interpreted labs.  The pertinent results include:  cbc nl other than hgb low at 11.7 (chronic), cmp with bun 27 and cr 1.53 (chronic), TSH nl at 1.866; ua nl   Imaging Studies ordered:  I ordered imaging studies including cxr, ct head  I independently visualized and interpreted imaging which showed  CXR: Small ill-defined opacity within the medial left lung base.  This may  reflect atelectasis. However, pneumonia cannot be excluded.  Correlate clinically and consider short-interval radiographic  follow-up.    Aortic Atherosclerosis (ICD10-I70.0).  CT head:  No evidence of an acute intracranial abnormality.  2. Parenchymal atrophy and chronic small vessel disease, as  described.  CT chest: No acute findings in the chest.  2. Mild cylindrical bronchiectasis with bronchial wall thickening in  the lower lungs bilaterally.  3.  Chronic atelectasis or scarring in the lower lobes bilaterally,  similar to prior.  4. Tiny hiatal hernia.  5.  Aortic Atherosclerosis (ICD10-I70.0).   I agree with the radiologist interpretation   Cardiac Monitoring:  The patient was maintained on a cardiac monitor.  I personally viewed and interpreted the cardiac monitored which showed an underlying rhythm of: nsr   Medicines ordered and prescription drug management:   I have reviewed the patients home medicines and have made adjustments as needed   Test Considered:  ct   Consultations Obtained:  I requested consultation with TTS,  and discussed lab and imaging findings as well as pertinent plan - consult pending  Problem List / ED Course:  Hallucinations:  pt only has them at night.  She is not suicidal or homicidal, but they are worrying to patient.  Pt does not have any infections.  She is in agreement to see psych.  Pt's son aware of plan.  Diet and meds ordered.   Reevaluation:  After the interventions noted above, I reevaluated the patient and found that they have :improved   Social Determinants of Health:  Lives alone   Dispostion:  After consideration of the diagnostic results and the patients response to treatment, I feel that the patent would benefit from pending psych eval.          Final Clinical Impression(s) / ED Diagnoses Final diagnoses:  Hallucinations    Rx / DC Orders ED Discharge Orders     None          Isla Pence, MD 08/01/22 1550

## 2022-08-01 NOTE — ED Provider Notes (Signed)
Signout from Dr. Particia Nearing.  89 year old female brought in from home for evaluation of confusion.  Her workup was fairly unremarkable although she was exhibiting some delusions.  Possibly hallucinations.  It was recommended that she stay for behavioral health evaluation. Physical Exam  BP (!) 147/55   Pulse 66   Temp 98.6 F (37 C) (Oral)   Resp 19   Ht 5\' 4"  (1.626 m)   Wt 65.8 kg   SpO2 93%   BMI 24.89 kg/m   Physical Exam  Procedures  Procedures  ED Course / MDM    Medical Decision Making Amount and/or Complexity of Data Reviewed Labs: ordered. Radiology: ordered.  Risk OTC drugs. Prescription drug management.   1830.  I was informed from the nurse that patient and her son would like to be discharged.  I went and reviewed her workup with her and recommendations from the prior doctor that she speak with psychiatry.  She said she has been waiting all day and does not feel like she needs to stay here any longer and can follow this up with her primary care doctor.  Her son is in agreement and would like to take her home.  They understand they can return if there is any worsening of her condition.       Terrilee Files, MD 08/02/22 [id number]

## 2022-08-03 ENCOUNTER — Encounter: Payer: Self-pay | Admitting: Family Medicine

## 2022-08-09 ENCOUNTER — Telehealth: Payer: Self-pay | Admitting: *Deleted

## 2022-08-09 ENCOUNTER — Encounter: Payer: Self-pay | Admitting: *Deleted

## 2022-08-09 NOTE — Transitions of Care (Post Inpatient/ED Visit) (Signed)
   08/09/2022  Name: [name] MRN: [id number] DOB: 05/28/1923  Today's TOC FU Call Status: Today's TOC FU Call Status:: Successful TOC FU Call Competed TOC FU Call Complete Date: 08/09/22  Transition Care Management Follow-up Telephone Call Date of Discharge: 08/02/22 [facility] Type of Discharge: Emergency Department (ED EMMI Flag for not having an appointment) Reason for ED Visit: Neurologic Neurologic Diagnosis:  (Hallucinations) How have you been since you were released from the hospital?: Better Any questions or concerns?: No  Items Reviewed: Did you receive and understand the discharge instructions provided?: Yes Medications obtained and verified?: Yes (Medications Reviewed) Any new allergies since your discharge?: No Dietary orders reviewed?: No Do you have support at home?: Yes People in Home: alone Name of Support/Comfort Primary Source: Konrad Dolores: 4 hours of in-home care on Wednesdays  Home Care and Equipment/Supplies: [place]?: No Any new equipment or medical supplies ordered?: No  Functional Questionnaire: Do you need assistance with bathing/showering or dressing?: No Do you need assistance with meal preparation?: No Do you need assistance with eating?: No Do you have difficulty maintaining continence: No Do you need assistance with getting out of bed/getting out of a chair/moving?: No Do you have difficulty managing or taking your medications?: No  Follow up appointments reviewed: PCP Follow-up appointment confirmed?: Yes Date of PCP follow-up appointment?: 08/10/22 Follow-up Provider: Dr Wolfgang [facility] Follow-up appointment confirmed?: NA Do you need transportation to your follow-up appointment?: No Do you understand care options if your condition(s) worsen?: Yes-patient verbalized understanding  SDOH Interventions Today    Flowsheet Row Most Recent Value  SDOH Interventions   Housing  Interventions Intervention Not Indicated  Transportation Interventions Intervention Not Indicated      Interventions Today    Flowsheet Row Most Recent Value  Chronic Disease   Chronic disease during today's visit Other  [ED visit for Hallucinations, Possible UTI (per patient)]  General Interventions   General Interventions Discussed/Reviewed Referral to Nurse, Doctor Visits  [scheduled with Joellyn Quails, RN Care Coordinator]  Doctor Visits Discussed/Reviewed PCP  [place] Discussed, Mental Health Reviewed, Refer to Social Work for counseling, Refer to Social Work for resources  Refer to Social Work for counseling regarding Anxiety/Coping, Depression, Other  [Hallcuinations]  Safety Interventions   Safety Discussed/Reviewed Engineer, materials Discussed, Holiday representative and emergency alert system]      TOC Interventions Today    Flowsheet Row Most Recent Value  TOC Interventions   TOC Interventions Discussed/Reviewed TOC Interventions Discussed, TOC Interventions Reviewed      Chong Sicilian, BSN, RN-BC RN Care Coordinator [place]: [phone] Main #: [phone]

## 2022-08-10 ENCOUNTER — Ambulatory Visit (INDEPENDENT_AMBULATORY_CARE_PROVIDER_SITE_OTHER): Payer: Medicare Other | Admitting: Family Medicine

## 2022-08-10 VITALS — BP 131/74 | Ht 64.0 in | Wt 145.0 lb

## 2022-08-10 DIAGNOSIS — R443 Hallucinations, unspecified: Secondary | ICD-10-CM

## 2022-08-10 DIAGNOSIS — R339 Retention of urine, unspecified: Secondary | ICD-10-CM | POA: Diagnosis not present

## 2022-08-10 DIAGNOSIS — R1030 Lower abdominal pain, unspecified: Secondary | ICD-10-CM | POA: Diagnosis not present

## 2022-08-10 NOTE — Patient Instructions (Signed)
Setraline please stop

## 2022-08-10 NOTE — Telephone Encounter (Signed)
Nurses Please let the family member know that I did a thorough evaluation with [name].  I stopped her sertraline.  We will be pursuing a CT scan of the abdomen and pelvis as quickly as this can be approved and set up I will call her but unfortunately at the time of the visit with the patient unable to make time to fit in an additional level of connection with family so therefore I will call her hopefully later today into this evening to discuss the visit thank you

## 2022-08-10 NOTE — Progress Notes (Signed)
   Subjective:    Patient ID: [name], female    DOB: 1923-11-27, 89 y.o.   MRN: [id number]  HPI  Patient arrives for a follow up from an recent ER visit for hallucinations. Patient states her main concern was pain in her stomach but they were concerned about her seeing people that was not there Granddaughters and DPR available by phone- see my chart message  Granddaughter was spoken with by phone Patient having hallucination issues where she sees things sometimes on the wall sometimes in her room.  None of these are disturbing.  There are just more frequent.  It is possible sertraline could be causing some of the issue She denies any major setbacks She feels that she is doing well overall She does relate that she is having a lot of lower abdominal pain and discomfort she has had this off and on for months but it has been worse over the past couple weeks states at times it keeps her awake She describes soreness tenderness in the lower abdomen.  She has had previous lab work and urine.  She suffers with bacteriuria and probable incomplete emptying of the bladder Review of Systems     Objective:   Physical Exam  General-in no acute distress Eyes-no discharge Lungs-respiratory rate normal, CTA CV-no murmurs,RRR Extremities skin warm dry no edema Neuro grossly normal Behavior normal, alert Lower abdomen moderate tenderness      Assessment & Plan:  1. Lower abdominal pain We will move forward with doing a CAT scan no need to do blood work blood work was done in the ER.  This CAT scan needs to be completed either this week or next week.  With her GFR being so low I do not recommend IV contrast - CT Abdomen Pelvis Wo Contrast  2. Hallucination Stop sertraline If her hallucinations do not stop then obviously it is not the sertraline.  But at that point if she has ongoing hallucinations especially if they become more disturbing she may need to be put on medication but currently  does not  3. Incomplete bladder emptying Incomplete bladder emptying more than likely the cause of her urinary symptoms.  Hold off on antibiotics  There is a possibility down the road that she will get to the point where she will not be able to take care of herself and may need to be in assisted living  Follow-up as planned in 2 weeks time

## 2022-08-15 ENCOUNTER — Other Ambulatory Visit: Payer: Self-pay | Admitting: Family Medicine

## 2022-08-17 ENCOUNTER — Ambulatory Visit (HOSPITAL_COMMUNITY)
Admission: RE | Admit: 2022-08-17 | Discharge: 2022-08-17 | Disposition: A | Discharge status: Home / Self Care | Payer: Medicare Other | Source: Ambulatory Visit | Attending: Family Medicine | Admitting: Family Medicine

## 2022-08-17 DIAGNOSIS — R1030 Lower abdominal pain, unspecified: Secondary | ICD-10-CM | POA: Insufficient documentation

## 2022-08-17 DIAGNOSIS — I700 Atherosclerosis of aorta: Secondary | ICD-10-CM | POA: Diagnosis not present

## 2022-08-20 ENCOUNTER — Emergency Department (HOSPITAL_COMMUNITY): Payer: Medicare Other

## 2022-08-20 ENCOUNTER — Encounter (HOSPITAL_COMMUNITY): Payer: Self-pay

## 2022-08-20 ENCOUNTER — Emergency Department (HOSPITAL_COMMUNITY)
Admission: EM | Admit: 2022-08-20 | Discharge: 2022-08-20 | Disposition: A | Discharge status: Home / Self Care | Payer: Medicare Other | Attending: Emergency Medicine | Admitting: Emergency Medicine

## 2022-08-20 ENCOUNTER — Other Ambulatory Visit: Payer: Self-pay

## 2022-08-20 DIAGNOSIS — K5900 Constipation, unspecified: Secondary | ICD-10-CM | POA: Diagnosis not present

## 2022-08-20 DIAGNOSIS — Z85828 Personal history of other malignant neoplasm of skin: Secondary | ICD-10-CM | POA: Insufficient documentation

## 2022-08-20 DIAGNOSIS — Z79899 Other long term (current) drug therapy: Secondary | ICD-10-CM | POA: Diagnosis not present

## 2022-08-20 DIAGNOSIS — R0602 Shortness of breath: Secondary | ICD-10-CM | POA: Diagnosis not present

## 2022-08-20 DIAGNOSIS — I10 Essential (primary) hypertension: Secondary | ICD-10-CM | POA: Diagnosis not present

## 2022-08-20 DIAGNOSIS — Z853 Personal history of malignant neoplasm of breast: Secondary | ICD-10-CM | POA: Insufficient documentation

## 2022-08-20 DIAGNOSIS — R457 State of emotional shock and stress, unspecified: Secondary | ICD-10-CM | POA: Diagnosis not present

## 2022-08-20 DIAGNOSIS — R030 Elevated blood-pressure reading, without diagnosis of hypertension: Secondary | ICD-10-CM

## 2022-08-20 DIAGNOSIS — F419 Anxiety disorder, unspecified: Secondary | ICD-10-CM

## 2022-08-20 DIAGNOSIS — N1832 Chronic kidney disease, stage 3b: Secondary | ICD-10-CM | POA: Diagnosis not present

## 2022-08-20 DIAGNOSIS — E039 Hypothyroidism, unspecified: Secondary | ICD-10-CM | POA: Insufficient documentation

## 2022-08-20 DIAGNOSIS — I129 Hypertensive chronic kidney disease with stage 1 through stage 4 chronic kidney disease, or unspecified chronic kidney disease: Secondary | ICD-10-CM | POA: Insufficient documentation

## 2022-08-20 DIAGNOSIS — M549 Dorsalgia, unspecified: Secondary | ICD-10-CM | POA: Diagnosis not present

## 2022-08-20 HISTORY — DX: Anxiety disorder, unspecified: F41.9

## 2022-08-20 LAB — CBC WITH DIFFERENTIAL/PLATELET
Abs Immature Granulocytes: 0.04 10*3/uL (ref 0.00–0.07)
Basophils Absolute: 0 10*3/uL (ref 0.0–0.1)
Basophils Relative: 0 %
Eosinophils Absolute: 0.1 10*3/uL (ref 0.0–0.5)
Eosinophils Relative: 2 %
HCT: 34.5 % — ABNORMAL LOW (ref 36.0–46.0)
Hemoglobin: 11.4 g/dL — ABNORMAL LOW (ref 12.0–15.0)
Immature Granulocytes: 1 %
Lymphocytes Relative: 18 %
Lymphs Abs: 1.5 10*3/uL (ref 0.7–4.0)
MCH: 29.3 pg (ref 26.0–34.0)
MCHC: 33 g/dL (ref 30.0–36.0)
MCV: 88.7 fL (ref 80.0–100.0)
Monocytes Absolute: 0.6 10*3/uL (ref 0.1–1.0)
Monocytes Relative: 6 %
Neutro Abs: 6.4 10*3/uL (ref 1.7–7.7)
Neutrophils Relative %: 73 %
Platelets: 320 10*3/uL (ref 150–400)
RBC: 3.89 MIL/uL (ref 3.87–5.11)
RDW: 12.7 % (ref 11.5–15.5)
WBC: 8.7 10*3/uL (ref 4.0–10.5)
nRBC: 0 % (ref 0.0–0.2)

## 2022-08-20 LAB — COMPREHENSIVE METABOLIC PANEL
ALT: 15 U/L (ref 0–44)
AST: 18 U/L (ref 15–41)
Albumin: 3.8 g/dL (ref 3.5–5.0)
Alkaline Phosphatase: 121 U/L (ref 38–126)
Anion gap: 11 (ref 5–15)
BUN: 32 mg/dL — ABNORMAL HIGH (ref 8–23)
CO2: 21 mmol/L — ABNORMAL LOW (ref 22–32)
Calcium: 9 mg/dL (ref 8.9–10.3)
Chloride: 106 mmol/L (ref 98–111)
Creatinine, Ser: 1.27 mg/dL — ABNORMAL HIGH (ref 0.44–1.00)
GFR, Estimated: 39 mL/min — ABNORMAL LOW (ref >60)
Glucose, Bld: 112 mg/dL — ABNORMAL HIGH (ref 70–99)
Potassium: 3.9 mmol/L (ref 3.5–5.1)
Sodium: 138 mmol/L (ref 135–145)
Total Bilirubin: 0.5 mg/dL (ref 0.3–1.2)
Total Protein: 6.7 g/dL (ref 6.5–8.1)

## 2022-08-20 LAB — BRAIN NATRIURETIC PEPTIDE: B Natriuretic Peptide: 43 pg/mL (ref 0.0–100.0)

## 2022-08-20 LAB — TROPONIN I (HIGH SENSITIVITY)
Troponin I (High Sensitivity): 5 ng/L (ref <18)
Troponin I (High Sensitivity): 5 ng/L (ref <18)

## 2022-08-20 LAB — LIPASE, BLOOD: Lipase: 35 U/L (ref 11–51)

## 2022-08-20 MED ORDER — HYDROXYZINE HCL 25 MG PO TABS: 25.0000 mg | ORAL_TABLET | Freq: Three times a day (TID) | ORAL | 0 refills | Status: DC | PRN

## 2022-08-20 MED ORDER — LORAZEPAM 1 MG PO TABS
1.0000 mg | ORAL_TABLET | Freq: Once | ORAL | Status: AC
  Administered 2022-08-20: 1 mg via ORAL
  Filled 2022-08-20: qty 1

## 2022-08-20 NOTE — ED Provider Notes (Signed)
[facility] Provider Note  CSN: [id number] Arrival date & time: 08/20/22 1635  Chief Complaint(s) Panic Attack  HPI [name] is a 89 y.o. female with past medical history as below, significant for  chronic abdominal pain, A-fib, chronic back pain, HLD, HTN, CKD stage III who presents to the ED with complaint of "panic attack."  Here with a myriad of complaints, in triage she mentioned that she was having overwhelming anxiety due to not being able to take her anxiety medication due to concern for sedating effects in conjunction with her opiate pain medication.  She reports that approximate 2 hours prior to arrival she felt lightheaded, she thought she was having chest discomfort and had rapid breathing, dry mouth, increased anxiety.  Symptoms have since improved since the onset, she has chronic abdominal pain.  She did CT scan her abdomen few days ago.  She has no dyspnea currently.  No chest pain currently.  No numbness or tingling, no headache.  Does have lightheaded sensation that is also slightly improved since the onset but has persisted.  No syncope.  She lives alone but is accompanied by her son  Past Medical History Past Medical History:  Diagnosis Date   Abdominal pain, chronic, right lower quadrant    "ever since I had my appendix removed"   Anxiety    Atrial fibrillation ([place])    Breast cancer ([place])    Cancer ([place])    left side   Chronic back pain    Edema    GERD (gastroesophageal reflux disease)    Hyperlipidemia    Hypertension    Osteopenia    Osteoporosis    Ovarian cyst 02/2015   left   Recurrent abdominal pain    LLQ   Patient Active Problem List   Diagnosis Date Noted   Stage 3b chronic kidney disease 06/16/2022   Head trauma 12/16/2021   Acute gout due to renal impairment involving foot 08/09/2021   SBO (small bowel obstruction) 11/07/2019   Nausea and vomiting 11/07/2019   Right hip pain 08/01/2019   HLD  (hyperlipidemia) 07/18/2017   B12 deficiency 07/18/2017   SAH (subarachnoid hemorrhage) 07/17/2017   ICH (intracerebral hemorrhage) 07/16/2017   Cervical disc disease 05/09/2017   Hypothyroid 05/09/2017   Atrial fibrillation 01/12/2016   Osteoarthritis of right knee 12/15/2015   Major depression 09/22/2015   Ventral hernia 05/21/2015   Ovarian tumor 04/13/2015   Squamous cell skin cancer 08/13/2014   Impaired fasting glucose 08/05/2013   Neuropathy 01/11/2013   Anxiety 01/29/2012   GERD (gastroesophageal reflux disease) 01/29/2012   Sciatica of right side 05/10/2011   Home Medication(s) Prior to Admission medications   Medication Sig Start Date End Date Taking? Authorizing Provider  hydrOXYzine (ATARAX) 25 MG tablet Take 1 tablet (25 mg total) by mouth every 8 (eight) hours as needed. 08/20/22  Yes Wynona Dove A, DO  allopurinol (ZYLOPRIM) 100 MG tablet TAKE (1/2) TABLET BY MOUTH DAILY. 02/02/22   [name], MD  carbamide peroxide (DEBROX) 6.5 % OTIC solution Place 5 drops into both ears 2 (two) times daily as needed (as needed for ear wax build up). Patient not taking: Reported on 04/06/2022 08/31/20   Scot Jun, NP  diltiazem (CARDIZEM CD) 120 MG 24 hr capsule Take 1 capsule (120 mg total) by mouth daily. 06/01/22   [name], MD  HYDROcodone-acetaminophen (NORCO/VICODIN) 5-325 MG tablet TAKE 1 TABLET BY MOUTH EVERY 6 HOURS AS  NEEDED. 08/17/22   [name], MD  levothyroxine (SYNTHROID) 75 MCG tablet Take 1 tablet (75 mcg total) by mouth daily. 05/06/22   [name], MD  lisinopril (ZESTRIL) 2.5 MG tablet Take 1 tablet (2.5 mg total) by mouth daily. 04/01/22   [name], MD  Multiple Vitamin (MULTIVITAMIN WITH MINERALS) TABS tablet Take 1 tablet by mouth daily.    [provider]  ondansetron (ZOFRAN) 4 MG tablet Take 1 tablet (4 mg total) by mouth every 8 (eight) hours as needed for nausea or vomiting. Patient not taking: Reported on  08/01/2022 12/17/21   Coral Spikes, DO  ondansetron (ZOFRAN-ODT) 4 MG disintegrating tablet Take 1 tablet (4 mg total) by mouth every 8 (eight) hours as needed for up to 10 doses for nausea or vomiting. Patient not taking: Reported on 08/01/2022 11/05/21   Luna Fuse, MD  oxymetazoline (AFRIN) 0.05 % nasal spray Place 1 spray into both nostrils 2 (two) times daily.    [provider]  pantoprazole (PROTONIX) 40 MG tablet TAKE 1 TABLET BY MOUTH ONCE DAILY. 07/04/22   [name], MD  polyethylene glycol powder (GLYCOLAX/MIRALAX) 17 GM/SCOOP powder Take 17 g by mouth daily.    [provider]  potassium chloride (KLOR-CON) 10 MEQ tablet TAKE ONE TABLET BY MOUTH ONCE DAILY. 07/04/22   [name], MD  Propylene Glycol (SYSTANE COMPLETE OP) Apply 1 drop to eye daily as needed (dryness).    [provider]  QC ACETAMINOPHEN 8 HOURS 650 MG CR tablet Take 1 tablet by mouth in the morning and at bedtime. 10/23/19   [provider]  torsemide (DEMADEX) 20 MG tablet TAKE (1) TABLET BY MOUTH EVERY OTHER DAY. 05/06/22   [name], MD  trolamine salicylate (ASPERCREME) 10 % cream Apply 1 application topically as needed (feet and shoulders).    [provider]  vitamin B-12 1000 MCG tablet Take 1 tablet (1,000 mcg total) by mouth daily. Patient not taking: Reported on 08/01/2022 07/21/17   Mary Sella, NP                                                                                                                                    Past Surgical History Past Surgical History:  Procedure Laterality Date   APPENDECTOMY     APPENDECTOMY     BREAST SURGERY Left    lumpectomy   CARDIOVERSION N/A 03/31/2016   Procedure: CARDIOVERSION;  Surgeon: Josue Hector, MD;  Location: [facility];  Service: Cardiovascular;  Laterality: N/A;   COLON RESECTION     COLON SURGERY     HERNIA REPAIR     HERNIA REPAIR     Family History Family History  Problem Relation  Age of Onset   Diabetes Mother    Heart disease Other    Arthritis Other    Cancer Other  Hyperlipidemia Brother     Social History Social History   Tobacco Use   Smoking status: Never    Passive exposure: Never   Smokeless tobacco: Never  Vaping Use   Vaping Use: Never used  Substance Use Topics   Alcohol use: No    Alcohol/week: 0.0 standard drinks of alcohol   Drug use: No   Allergies Amiodarone, Cephalosporins, Meloxicam, Robaxin [methocarbamol], Amoxicillin, Lovenox [enoxaparin sodium], Morphine, Morphine and related, Penicillins, Sulfa antibiotics, Lipitor [atorvastatin], Lorazepam, Macrobid [nitrofurantoin], and Metoprolol  Review of Systems Review of Systems  Constitutional:  Negative for activity change and fever.  HENT:  Negative for facial swelling and trouble swallowing.   Eyes:  Negative for discharge and redness.  Respiratory:  Positive for shortness of breath. Negative for cough.   Cardiovascular:  Positive for chest pain. Negative for palpitations.  Gastrointestinal:  Positive for abdominal pain. Negative for nausea.  Genitourinary:  Negative for dysuria and flank pain.  Musculoskeletal:  Negative for back pain and gait problem.  Skin:  Negative for pallor and rash.  Neurological:  Positive for light-headedness. Negative for syncope and headaches.  Psychiatric/Behavioral:  The patient is nervous/anxious.     Physical Exam Vital Signs  I have reviewed the triage vital signs BP (!) 174/64   Pulse 70   Temp 98 F (36.7 C) (Oral)   Resp 17   Ht 5\' 4"  (1.626 m)   Wt 65.8 kg   SpO2 95%   BMI 24.90 kg/m  Physical Exam Vitals and nursing note reviewed.  Constitutional:      General: She is not in acute distress.    Appearance: Normal appearance. She is not ill-appearing.  HENT:     Head: Normocephalic and atraumatic. No raccoon eyes, Battle's sign, right periorbital erythema or left periorbital erythema.     Jaw: There is normal jaw occlusion. No  trismus.     Right Ear: External ear normal.     Left Ear: External ear normal.     Nose: Nose normal.     Mouth/Throat:     Mouth: Mucous membranes are moist.  Eyes:     General: No scleral icterus.       Right eye: No discharge.        Left eye: No discharge.     Extraocular Movements: Extraocular movements intact.     Pupils: Pupils are equal, round, and reactive to light.  Cardiovascular:     Rate and Rhythm: Normal rate and regular rhythm.     Pulses: Normal pulses.     Heart sounds: Normal heart sounds.  Pulmonary:     Effort: Pulmonary effort is normal. No respiratory distress.     Breath sounds: Normal breath sounds.  Abdominal:     General: Abdomen is flat.     Palpations: Abdomen is soft.     Tenderness: There is generalized abdominal tenderness.  Musculoskeletal:        General: Normal range of motion.     Cervical back: Normal range of motion.     Right lower leg: No edema.     Left lower leg: No edema.  Skin:    General: Skin is warm and dry.     Capillary Refill: Capillary refill takes less than 2 seconds.  Neurological:     Mental Status: She is alert and oriented to person, place, and time.     GCS: GCS eye subscore is 4. GCS verbal subscore is 5. GCS motor subscore is 6.  Cranial Nerves: Cranial nerves 2-12 are intact. No dysarthria.     Sensory: Sensation is intact.     Motor: Motor function is intact. No tremor.     Coordination: Coordination is intact.  Psychiatric:        Mood and Affect: Mood normal.        Behavior: Behavior normal.     ED Results and Treatments Labs (all labs ordered are listed, but only abnormal results are displayed) Labs Reviewed  CBC WITH DIFFERENTIAL/PLATELET - Abnormal; Notable for the following components:      Result Value   Hemoglobin 11.4 (*)    HCT 34.5 (*)    All other components within normal limits  COMPREHENSIVE METABOLIC PANEL - Abnormal; Notable for the following components:   CO2 21 (*)    Glucose,  Bld 112 (*)    BUN 32 (*)    Creatinine, Ser 1.27 (*)    GFR, Estimated 39 (*)    All other components within normal limits  BRAIN NATRIURETIC PEPTIDE  LIPASE, BLOOD  TROPONIN I (HIGH SENSITIVITY)  TROPONIN I (HIGH SENSITIVITY)                                                                                                                          Radiology No results found.  Pertinent labs & imaging results that were available during my care of the patient were reviewed by me and considered in my medical decision making (see MDM for details).  Medications Ordered in ED Medications  LORazepam (ATIVAN) tablet 1 mg (1 mg Oral Given 08/20/22 1931)                                                                                                                                     Procedures Procedures  (including critical care time)  Medical Decision Making / ED Course    Medical Decision Making:    CESIA SCHEMENAUER is a 89 y.o. female with past medical history as below, significant for  chronic abdominal pain, A-fib, chronic back pain, HLD, HTN, CKD stage III who presents to the ED with complaint of "panic attack.". The complaint involves an extensive differential diagnosis and also carries with it a high risk of complications and morbidity.  Serious etiology was considered. Ddx includes but is not limited to: In my evaluation of this patient's dyspnea my DDx includes, but is  not limited to, pneumonia, pulmonary embolism, pneumothorax, pulmonary edema, metabolic acidosis, asthma, COPD, cardiac cause, anemia, anxiety, etc. Differential includes all life-threatening causes for chest pain. This includes but is not exclusive to acute coronary syndrome, aortic dissection, pulmonary embolism, cardiac tamponade, community-acquired pneumonia, pericarditis, musculoskeletal chest wall pain, etc.   Complete initial physical exam performed, notably the patient  was no acute distress, breathing  comfortably ambient air, requesting medication to help with her anxiety.    Reviewed and confirmed nursing documentation for past medical history, family history, social history.  Vital signs reviewed.    Clinical Course as of 08/22/22 0009  Sat Aug 20, 2022  2126 She is feeling much better, she had a large bowel movement that was without melena, nonbloody.  Feeling better after she had bowel movement. [SG]    Clinical Course User Index [SG] Jeanell Sparrow, DO   Patient reports that she is back to her baseline, she is feeling much better after she had a bowel movement and had Ativan.  She feels her primary issue is her anxiety.  Workup here is reassuring.  Will give patient trial of Atarax for home, follow with PCP for further evaluation.  Son to take her home tonight.  The patient improved significantly and was discharged in stable condition. Detailed discussions were had with the patient regarding current findings, and need for close f/u with PCP or on call doctor. The patient has been instructed to return immediately if the symptoms worsen in any way for re-evaluation. Patient verbalized understanding and is in agreement with current care plan. All questions answered prior to discharge.    Additional history obtained: -Additional history obtained from family -External records from outside source obtained and reviewed including: Chart review including previous notes, labs, imaging, consultation notes including prior labs and imaging, primary care documentation, home medications, prior ED visits   Lab Tests: -I ordered, reviewed, and interpreted labs.   The pertinent results include:   Labs Reviewed  CBC WITH DIFFERENTIAL/PLATELET - Abnormal; Notable for the following components:      Result Value   Hemoglobin 11.4 (*)    HCT 34.5 (*)    All other components within normal limits  COMPREHENSIVE METABOLIC PANEL - Abnormal; Notable for the following components:   CO2 21 (*)    Glucose,  Bld 112 (*)    BUN 32 (*)    Creatinine, Ser 1.27 (*)    GFR, Estimated 39 (*)    All other components within normal limits  BRAIN NATRIURETIC PEPTIDE  LIPASE, BLOOD  TROPONIN I (HIGH SENSITIVITY)  TROPONIN I (HIGH SENSITIVITY)    Notable for stable  EKG   EKG Interpretation  Date/Time:  Saturday August 20 2022 17:53:36 EDT Ventricular Rate:  75 PR Interval:  211 QRS Duration: 91 QT Interval:  408 QTC Calculation: 456 R Axis:   -28 Text Interpretation: Sinus rhythm Inferior infarct, old Consider anterior infarct similar to prior tracing Confirmed by Wynona Dove ([id number]) on 08/20/2022 9:27:41 PM         Imaging Studies ordered: I ordered imaging studies including CXR I independently visualized the following imaging with scope of interpretation limited to determining acute life threatening conditions related to emergency care; findings noted above, significant for wnl I independently visualized and interpreted imaging. I agree with the radiologist interpretation   Medicines ordered and prescription drug management: Meds ordered this encounter  Medications   LORazepam (ATIVAN) tablet 1 mg   hydrOXYzine (ATARAX) 25 MG tablet  Sig: Take 1 tablet (25 mg total) by mouth every 8 (eight) hours as needed.    Dispense:  10 tablet    Refill:  0    -I have reviewed the patients home medicines and have made adjustments as needed   Consultations Obtained: na   Cardiac Monitoring: The patient was maintained on a cardiac monitor.  I personally viewed and interpreted the cardiac monitored which showed an underlying rhythm of: sr  Social Determinants of Health:  Diagnosis or treatment significantly limited by social determinants of health: lives alone   Reevaluation: After the interventions noted above, I reevaluated the patient and found that they have resolved  Co morbidities that complicate the patient evaluation  Past Medical History:  Diagnosis Date   Abdominal pain,  chronic, right lower quadrant    "ever since I had my appendix removed"   Anxiety    Atrial fibrillation ([place])    Breast cancer ([place])    Cancer ([place])    left side   Chronic back pain    Edema    GERD (gastroesophageal reflux disease)    Hyperlipidemia    Hypertension    Osteopenia    Osteoporosis    Ovarian cyst 02/2015   left   Recurrent abdominal pain    LLQ      Dispostion: Disposition decision including need for hospitalization was considered, and patient discharged from emergency department.    Final Clinical Impression(s) / ED Diagnoses Final diagnoses:  Constipation, unspecified constipation type  Elevated blood pressure reading  Anxiousness     This chart was dictated using voice recognition software.  Despite best efforts to proofread,  errors can occur which can change the documentation meaning.    Jeanell Sparrow, DO 08/22/22 0009

## 2022-08-20 NOTE — ED Notes (Signed)
Bedpan removed. No bm.

## 2022-08-20 NOTE — ED Notes (Signed)
Pt called out wanting bedpan.

## 2022-08-20 NOTE — ED Triage Notes (Signed)
Pt BIB EMS from home, pt lives at home alone, pt ambulatory and walked to stretcher per EMS, however has walker at home, EMS called out for panic attack and chronic back pain, report pt took hydrocodone for back pain prior to arrival, was taken of xanax by PCP as she can not take these two medications together due to sedation and fall risk, pt says she had a panic attack b/c she didn't have anxiety meds

## 2022-08-20 NOTE — Discharge Instructions (Addendum)
It was a pleasure caring for you today in the emergency department. ° °Please return to the emergency department for any worsening or worrisome symptoms. ° ° °

## 2022-08-21 ENCOUNTER — Telehealth: Payer: Self-pay | Admitting: Family Medicine

## 2022-08-21 NOTE — Telephone Encounter (Signed)
Patient has a follow-up coming up with me on 4 April We will discuss her CT and also see how she is doing Surgery does not feel she needs to undergo any type of intervention currently See discussion below-I agree with surgery holding off on any type of surgical intervention unless significant troubles  Virl Cagey, MD  [name], MD Yes I agree I reviewed the new CT and if no obstructive symptoms and if pain is only intermittent I would just watch. She is older and when I discussed with her and her son in 2021 we all felt she was high risk.  If she continues to have pain right over the hernia we may need to consider.  When I saw her a lot of her pain was hip pain related.    I think continuing to watch is reasonable if her symptoms are minimal. They could try an abd binder if they want in the lower abdomen.  Ria Comment

## 2022-08-23 ENCOUNTER — Telehealth: Payer: Self-pay

## 2022-08-23 NOTE — Telephone Encounter (Signed)
     Patient  visit on 08/20/2022  at [facility] was for panic attack.  Have you been able to follow up with your primary care physician? Yes, patient has appointment 08/25/22.  The patient was or was not able to obtain any needed medicine or equipment. No medication prescribed.  Are there diet recommendations that you are having difficulty following? No  Patient expresses understanding of discharge instructions and education provided has no other needs at this time. Yes   [place] Resource Care Guide   [name]  ?? [phone]   [email or web address]: [email or web address]  [email or web address]

## 2022-08-24 ENCOUNTER — Encounter: Payer: Self-pay | Admitting: *Deleted

## 2022-08-24 ENCOUNTER — Ambulatory Visit: Payer: Self-pay | Admitting: *Deleted

## 2022-08-24 NOTE — Patient Instructions (Signed)
Visit Information  Thank you for taking time to visit with me today. Please don't hesitate to contact me if I can be of assistance to you.   Following are the goals we discussed today:   Goals Addressed               This Visit's Progress     Reduce and Manage Symptoms of Anxiety and Depression. (pt-stated)   On track     Care Coordination Interventions:  Interventions Today    Flowsheet Row Most Recent Value  Chronic Disease   Chronic disease during today's visit Atrial Fibrillation (AFib), Chronic Kidney Disease/End Stage Renal Disease (ESRD), Other  [Neuropathy, Anxiety, Depression & Head Trauma]  General Interventions   General Interventions Discussed/Reviewed General Interventions Discussed, General Interventions Reviewed, Annual Eye Exam, Durable Medical Equipment (DME), Labs, Vaccines, Health Screening, Community Resources, Level of Care, Communication with, Doctor Visits  [Communication with Primary Care Provider]  Labs Hgb A1c every 3 months, Kidney Function  [Encouraged]  Vaccines COVID-19, Flu, Pneumonia, RSV, Shingles, Tetanus/Pertussis/Diphtheria  [Encouraged]  Doctor Visits Discussed/Reviewed Doctor Visits Discussed, Doctor Visits Reviewed, Annual Wellness Visits, PCP, Specialist  [Encouraged]  Health Screening Bone Density, Colonoscopy, Mammogram  [Encouraged]  Durable Medical Equipment (DME) BP Cuff, Walker  PCP/Specialist Visits Compliance with follow-up visit  [Encouraged]  Communication with PCP/Specialists, RN  Level of Care Adult Daycare, Assisted Living, Personal Care Services  [Encouraged]  Exercise Interventions   Exercise Discussed/Reviewed Exercise Discussed, Exercise Reviewed, Physical Activity, Weight Managment, Assistive device use and maintanence  [Encouraged]  Physical Activity Discussed/Reviewed Physical Activity Discussed, Home Exercise Program (HEP), Physical Activity Reviewed, Types of exercise  [Encouraged]  Weight Management Weight maintenance   [Encouraged]  Education Interventions   Education Provided Provided Engineer, site, Provided Web-based Education, Provided Education  Provided Verbal Education On Nutrition, [place], Labs, Mental Health/Coping with Illness, When to see the doctor, Applications, Exercise, Medication, Personal assistant, [employer] Resources  [Encouraged]  [place] Discussed, Anxiety, Depression, Mental Health Reviewed, Grief and Loss, Substance Abuse, Coping Strategies, Crisis, Suicide  Nutrition Interventions   Nutrition Discussed/Reviewed Nutrition Discussed, Adding fruits and vegetables, Increaing proteins, Decreasing fats, Decreasing salt, Supplmental nutrition, Decreasing sugar intake, Portion sizes, Carbohydrate meal planning, Nutrition Reviewed, Fluid intake  [Encouraged]  Pharmacy Interventions   Pharmacy Dicussed/Reviewed Pharmacy Topics Discussed, Medication Adherence, Pharmacy Topics Reviewed, Medications and their functions, Affording Medications  [Encouraged]  Safety Interventions   Safety Discussed/Reviewed Safety Discussed, Safety Reviewed, Fall Risk, Home Safety  [Encouraged]  Home Safety Assistive Devices, Need for home safety assessment, Refer for community resources  [Encouraged]  Advanced Directive Interventions   Advanced Directives Discussed/Reviewed Advanced Directives Discussed, Advanced Directives Reviewed     Assessed Social Determinant of Health Barriers. Discussed Plans for Ongoing Care Management Follow Up. Provided Tree surgeon Information for Care Management Team Members. Screened for Signs & Symptoms of Depression, Related to Chronic Disease State.  PHQ2 & PHQ9 Depression Screen Completed & Results Reviewed.  Suicidal Ideation & Homicidal Ideation Assessed - None Present.   Domestic Violence Assessed - None Present. Access to Weapons Assessed - None Present.   Active Listening & Reflection  Utilized.  Verbalization of Feelings Encouraged. Emotional Support Provided. Feelings of Depression & Anxiety Validated. Symptoms of Grief Regarding Loss of Independence Acknowledged. Problem-Solving Strategies Employed. Solution-Focused Interventions Activated. Task-Centered Activities Implemented. Acceptance & Commitment Therapy Indicated. Cognitive Behavioral Therapy Performed.  Client-Centered Therapy Initiated. Crisis Support Information, Agencies, Community education officer  Discussed. Reviewed Prescription Medications & Discussed Importance of Compliance. Quality of Sleep Assessed & Sleep Hygiene Techniques Promoted. Continue to [place], Every Monday & Wednesday for 4 Hours, to Assist with Light Housekeeping Duties, Meal Preparation, Grocery Shopping, Laundry, Bathing, [employer], Running Errands, [employer], [place]. Please Attend Follow-Up Appointment with Primary Care Provider, Dr. Sallee Lange with [facility] [phone]), Scheduled on 08/25/2022 at 10:00 AM. Please Contact CSW Directly (# [phone]), if You Have Questions, Need Assistance, or If Additional Social Work Needs Are Identified Between Now & Our Next Scheduled Follow-Up Outreach Call.      Our next appointment is by telephone on 09/06/2022 at 12:30 pm.  Please call the care guide team at [phone] if you need to cancel or reschedule your appointment.   If you are experiencing a Mental Health or [place] or need someone to talk to, please call the Suicide and Crisis Lifeline: 988 call the [place] National Suicide Prevention Lifeline: 859-176-3613 or TTY: 980-330-4416 TTY 401-790-2092) to talk to a trained counselor call 1-800-273-TALK (toll free, 24 hour hotline) go to [facility] [address] [place] [phone]) call the [place]: [phone] call 911  Patient  verbalizes understanding of instructions and care plan provided today and agrees to view in [place]. Active MyChart status and patient understanding of how to access instructions and care plan via MyChart confirmed with patient.     Telephone follow up appointment with care management team member scheduled for:  09/06/2022 at 12:30 pm.    Nat Christen, BSW, MSW, [place]  Licensed Clinical Social Worker  [place]  Mailing [place]. [address], [place], [place] [address] Physical Address-[address] [place], [place] [address] Toll Free Main # [phone] Fax # [phone] Cell # [phone] [email or web address]

## 2022-08-24 NOTE — Patient Outreach (Signed)
Care Coordination   Follow Up Visit Note   08/24/2022  Name: [name] MRN: [id number] DOB: 03-Jun-1923  [name] is a 89 y.o. year old female who sees Luking, Elayne Snare, MD for primary care. I spoke with [name] by phone today.  What matters to the patients health and wellness today?  Reduce and Manage Symptoms of Anxiety and Depression.   Goals Addressed               This Visit's Progress     Reduce and Manage Symptoms of Anxiety and Depression. (pt-stated)   On track     Care Coordination Interventions:  Interventions Today    Flowsheet Row Most Recent Value  Chronic Disease   Chronic disease during today's visit Atrial Fibrillation (AFib), Chronic Kidney Disease/End Stage Renal Disease (ESRD), Other  [Neuropathy, Anxiety, Depression & Head Trauma]  General Interventions   General Interventions Discussed/Reviewed General Interventions Discussed, General Interventions Reviewed, Annual Eye Exam, Durable Medical Equipment (DME), Labs, Vaccines, Health Screening, Community Resources, Level of Care, Communication with, Doctor Visits  [Communication with Primary Care Provider]  Labs Hgb A1c every 3 months, Kidney Function  [Encouraged]  Vaccines COVID-19, Flu, Pneumonia, RSV, Shingles, Tetanus/Pertussis/Diphtheria  [Encouraged]  Doctor Visits Discussed/Reviewed Doctor Visits Discussed, Doctor Visits Reviewed, Annual Wellness Visits, PCP, Specialist  [Encouraged]  Health Screening Bone Density, Colonoscopy, Mammogram  [Encouraged]  Durable Medical Equipment (DME) BP Cuff, Walker  PCP/Specialist Visits Compliance with follow-up visit  [Encouraged]  Communication with PCP/Specialists, RN  Level of Care Adult Daycare, Assisted Living, Personal Care Services  [Encouraged]  Exercise Interventions   Exercise Discussed/Reviewed Exercise Discussed, Exercise Reviewed, Physical Activity, Weight Managment, Assistive device use and maintanence  [Encouraged]  Physical Activity  Discussed/Reviewed Physical Activity Discussed, Home Exercise Program (HEP), Physical Activity Reviewed, Types of exercise  [Encouraged]  Weight Management Weight maintenance  [Encouraged]  Education Interventions   Education Provided Provided Engineer, site, Provided Web-based Education, Provided Education  Provided Verbal Education On Nutrition, [place], Labs, Mental Health/Coping with Illness, When to see the doctor, Applications, Exercise, Medication, Personal assistant, [employer] Resources  [Encouraged]  [place] Discussed, Anxiety, Depression, Mental Health Reviewed, Grief and Loss, Substance Abuse, Coping Strategies, Crisis, Suicide  Nutrition Interventions   Nutrition Discussed/Reviewed Nutrition Discussed, Adding fruits and vegetables, Increaing proteins, Decreasing fats, Decreasing salt, Supplmental nutrition, Decreasing sugar intake, Portion sizes, Carbohydrate meal planning, Nutrition Reviewed, Fluid intake  [Encouraged]  Pharmacy Interventions   Pharmacy Dicussed/Reviewed Pharmacy Topics Discussed, Medication Adherence, Pharmacy Topics Reviewed, Medications and their functions, Affording Medications  [Encouraged]  Safety Interventions   Safety Discussed/Reviewed Safety Discussed, Safety Reviewed, Fall Risk, Home Safety  [Encouraged]  Home Safety Assistive Devices, Need for home safety assessment, Refer for community resources  [Encouraged]  Advanced Directive Interventions   Advanced Directives Discussed/Reviewed Advanced Directives Discussed, Advanced Directives Reviewed     Assessed Social Determinant of Health Barriers. Discussed Plans for Ongoing Care Management Follow Up. Provided Tree surgeon Information for Care Management Team Members. Screened for Signs & Symptoms of Depression, Related to Chronic Disease State.  PHQ2 & PHQ9 Depression Screen Completed & Results Reviewed.  Suicidal Ideation  & Homicidal Ideation Assessed - None Present.   Domestic Violence Assessed - None Present. Access to Weapons Assessed - None Present.   Active Listening & Reflection Utilized.  Verbalization of Feelings Encouraged. Emotional Support Provided. Feelings of Depression & Anxiety Validated. Symptoms of Grief Regarding Loss  of Independence Acknowledged. Problem-Solving Strategies Employed. Solution-Focused Interventions Activated. Task-Centered Activities Implemented. Acceptance & Commitment Therapy Indicated. Cognitive Behavioral Therapy Performed.  Client-Centered Therapy Initiated. Crisis Support Information, Agencies, Services & Resources Discussed. Reviewed Prescription Medications & Discussed Importance of Compliance. Quality of Sleep Assessed & Sleep Hygiene Techniques Promoted. Continue to [place], Every Monday & Wednesday for 4 Hours, to Assist with Light Housekeeping Duties, Meal Preparation, Grocery Shopping, Laundry, Bathing, [employer], Running Errands, [employer], [place]. Please Attend Follow-Up Appointment with Primary Care Provider, Dr. Sallee Lange with [facility] [phone]), Scheduled on 08/25/2022 at 10:00 AM. Please Contact CSW Directly (# [phone]), if You Have Questions, Need Assistance, or If Additional Social Work Needs Are Identified Between Now & Our Next Scheduled Follow-Up Outreach Call.        SDOH assessments and interventions completed:  Yes.  SDOH Interventions Today    Flowsheet Row Most Recent Value  SDOH Interventions   Food Insecurity Interventions Intervention Not Indicated  Housing Interventions Intervention Not Indicated  Transportation Interventions Intervention Not Indicated, Patient Resources (Friends/Family)  Utilities Interventions Intervention Not Indicated  Alcohol Usage Interventions Intervention Not Indicated (Score <7)  Depression Interventions/Treatment   Referral to Psychiatry, Medication, Counseling  Financial Strain Interventions Intervention Not Indicated  Physical Activity Interventions Patient Refused  Stress Interventions Intervention Not Indicated, Offered [employer], Provide Counseling  Social Connections Interventions Intervention Not Indicated     Care Coordination Interventions:  Yes, provided.   Follow up plan: Follow up call scheduled for 09/06/2022 at 12:30 pm.  Encounter Outcome:  Pt. Visit Completed.   Nat Christen, BSW, MSW, LCSW  Licensed Education officer, environmental [facility]  Mailing [place] [address] [place], [place] [address] Physical Address-[address], [place], [place] [address] Toll Free Main # [phone] Fax # [phone] Cell # [phone] [email or web address]

## 2022-08-25 ENCOUNTER — Ambulatory Visit (INDEPENDENT_AMBULATORY_CARE_PROVIDER_SITE_OTHER): Payer: Medicare Other | Admitting: Family Medicine

## 2022-08-25 VITALS — BP 143/58 | HR 73 | Temp 98.1°F | Ht 64.0 in | Wt 146.0 lb

## 2022-08-25 DIAGNOSIS — R443 Hallucinations, unspecified: Secondary | ICD-10-CM | POA: Diagnosis not present

## 2022-08-25 DIAGNOSIS — F419 Anxiety disorder, unspecified: Secondary | ICD-10-CM

## 2022-08-25 DIAGNOSIS — R296 Repeated falls: Secondary | ICD-10-CM

## 2022-08-25 MED ORDER — BUSPIRONE HCL 10 MG PO TABS: ORAL_TABLET | ORAL | 4 refills | Status: DC

## 2022-08-25 NOTE — Progress Notes (Signed)
   Subjective:    Patient ID: [name], female    DOB: April 18, 1924, 89 y.o.   MRN: [id number]  HPI  ER follow up for anxiousness started back on sertraline for anxiety and hallucinations Was started on hydroxyzine and pain medication refill - for low back pain Review of Systems     Objective:   Physical Exam  General-in no acute distress Eyes-no discharge Lungs-respiratory rate normal, CTA CV-no murmurs,RRR Extremities skin warm dry no edema Neuro grossly normal Behavior normal, alert       Assessment & Plan:  1. Anxiety BuSpar 10 mg 1 twice daily as needed stop hydroxyzine.  Stay away from any ongoing frequent benzodiazepine  2. Hallucinations Hold off on sertraline.  Caretaker stated that she was off sertraline for days and was still having hallucinations certainly hallucinations can occur with age related issues hold off on any medicines for this currently  Patient had a high risk of falling but she is doing the best she can she has subpar home situation has a helper 1 or 2 days a week and that is it it would benefit her to have a helper more often.  We will do so anything additional that can be done for her  Follow-up in 3 to 4 weeks to recheck regarding anxiety hallucination and her other health issues

## 2022-08-26 ENCOUNTER — Ambulatory Visit: Payer: Self-pay | Admitting: *Deleted

## 2022-08-26 ENCOUNTER — Encounter: Payer: Self-pay | Admitting: *Deleted

## 2022-08-26 NOTE — Patient Instructions (Signed)
Visit Information  Thank you for taking time to visit with me today. Please don't hesitate to contact me if I can be of assistance to you.   Following are the goals we discussed today:   Goals Addressed               This Visit's Progress     Reduce and Manage Symptoms of Anxiety and Depression. (pt-stated)   On track     Care Coordination Interventions:  Interventions Today    Flowsheet Row Most Recent Value  Chronic Disease   Chronic disease during today's visit Atrial Fibrillation (AFib), Chronic Kidney Disease/End Stage Renal Disease (ESRD), Other  [Neuropathy, Anxiety, Depression & Head Trauma]  General Interventions   General Interventions Discussed/Reviewed General Interventions Discussed, General Interventions Reviewed, Annual Eye Exam, Durable Medical Equipment (DME), Labs, Vaccines, Health Screening, Community Resources, Level of Care, Communication with, Doctor Visits  [Communication with Primary Care Provider]  Labs Hgb A1c every 3 months, Kidney Function  [Encouraged]  Vaccines COVID-19, Flu, Pneumonia, RSV, Shingles, Tetanus/Pertussis/Diphtheria  [Encouraged]  Doctor Visits Discussed/Reviewed Doctor Visits Discussed, Doctor Visits Reviewed, Annual Wellness Visits, PCP, Specialist  [Encouraged]  Health Screening Bone Density, Colonoscopy, Mammogram  [Encouraged]  Durable Medical Equipment (DME) BP Cuff, Walker  PCP/Specialist Visits Compliance with follow-up visit  [Encouraged]  Communication with PCP/Specialists, RN  Level of Care Adult Daycare, Assisted Living, Personal Care Services  [Encouraged]  Exercise Interventions   Exercise Discussed/Reviewed Exercise Discussed, Exercise Reviewed, Physical Activity, Weight Managment, Assistive device use and maintanence  [Encouraged]  Physical Activity Discussed/Reviewed Physical Activity Discussed, Home Exercise Program (HEP), Physical Activity Reviewed, Types of exercise  [Encouraged]  Weight Management Weight maintenance   [Encouraged]  Education Interventions   Education Provided Provided Engineer, site, Provided Web-based Education, Provided Education  Provided Verbal Education On Nutrition, [place], Labs, Mental Health/Coping with Illness, When to see the doctor, Applications, Exercise, Medication, Personal assistant, [employer] Resources  [Encouraged]  [place] Discussed, Anxiety, Depression, Mental Health Reviewed, Grief and Loss, Substance Abuse, Coping Strategies, Crisis, Suicide  Nutrition Interventions   Nutrition Discussed/Reviewed Nutrition Discussed, Adding fruits and vegetables, Increaing proteins, Decreasing fats, Decreasing salt, Supplmental nutrition, Decreasing sugar intake, Portion sizes, Carbohydrate meal planning, Nutrition Reviewed, Fluid intake  [Encouraged]  Pharmacy Interventions   Pharmacy Dicussed/Reviewed Pharmacy Topics Discussed, Medication Adherence, Pharmacy Topics Reviewed, Medications and their functions, Affording Medications  [Encouraged]  Safety Interventions   Safety Discussed/Reviewed Safety Discussed, Safety Reviewed, Fall Risk, Home Safety  [Encouraged]  Home Safety Assistive Devices, Need for home safety assessment, Refer for community resources  [Encouraged]  Advanced Directive Interventions   Advanced Directives Discussed/Reviewed Advanced Directives Discussed, Advanced Directives Reviewed     Assessed Social Determinant of Health Barriers. Discussed Plans for Ongoing Care Management Follow Up. Provided Tree surgeon Information for Care Management Team Members. Screened for Signs & Symptoms of Depression, Related to Chronic Disease State.  PHQ2 & PHQ9 Depression Screen Completed & Results Reviewed.  Suicidal Ideation & Homicidal Ideation Assessed - None Present.   Domestic Violence Assessed - None Present. Access to Weapons Assessed - None Present.   Active Listening & Reflection  Utilized.  Verbalization of Feelings Encouraged. Emotional Support Provided. Feelings of Depression & Anxiety Validated. Symptoms of Grief Regarding Loss of Independence Acknowledged. Problem-Solving Strategies Employed. Solution-Focused Interventions Activated. Task-Centered Activities Implemented. Acceptance & Commitment Therapy Indicated. Cognitive Behavioral Therapy Performed.  Client-Centered Therapy Initiated. Crisis Support Information, Agencies, Community education officer  Discussed. Reviewed Prescription Medications & Discussed Importance of Compliance. Quality of Sleep Assessed & Sleep Hygiene Techniques Promoted. Continue to Receive Private Pay Caregiver Services, Every Monday & Wednesday for 4 Hours, to Assist with Light Housekeeping Duties, Meal Preparation, Grocery Shopping, Laundry, Bathing, [employer], Running Errands, [employer], [place]. Please [facility] Medical Alert Device When Red Light Starts Flashing, Indicating Battery Low. Please Attend Follow-Up Appointment with Primary Care Provider, Dr. Lilyan Punt with [facility] [phone]), Scheduled on 08/25/2022 at 10:00 AM. Please Contact CSW Directly (# [phone]), if You Have Questions, Need Assistance, or If Additional Social Work Needs Are Identified Between Now & Our Next Scheduled Follow-Up Outreach Call.      Our next appointment is by telephone on 09/06/2022 at 12:30 pm.   Please call the care guide team at [phone] if you need to cancel or reschedule your appointment.   If you are experiencing a Mental Health or Behavioral Health Crisis or need someone to talk to, please call the Suicide and Crisis Lifeline: 988 call the [place] National Suicide Prevention Lifeline: 435-436-7912 or TTY: 719-008-4295 TTY 307-261-5183) to talk to a trained counselor call 1-800-273-TALK (toll free, 24 hour hotline) go to [facility] [address] [place] [phone]) call the [facility] Crisis Line: [phone] call 911  Patient verbalizes understanding of instructions and care plan provided today and agrees to view in MyChart. Active MyChart status and patient understanding of how to access instructions and care plan via MyChart confirmed with patient.     Telephone follow up appointment with care management team member scheduled for: 09/06/2022 at 12:30 pm.   Danford Bad, BSW, MSW, LCSW  Licensed Clinical Social Worker  [facility]  Mailing [place]. [address] [place], [place] [address] Physical Address-[address], [place], [place] [address] Toll Free Main # [phone] Fax # [phone] Cell # [phone] [email or web address]

## 2022-08-26 NOTE — Patient Outreach (Signed)
Care Coordination   Follow Up Visit Note   08/26/2022  Name: [name] MRN: [id number] DOB: 05/05/1924  [name] is a 89 y.o. year old female who sees Luking, Jonna Coup, MD for primary care. I spoke with [name] by phone today.  What matters to the patients health and wellness today?  Reduce and Manage Symptoms of Anxiety and Depression.   Goals Addressed               This Visit's Progress     Reduce and Manage Symptoms of Anxiety and Depression. (pt-stated)   On track     Care Coordination Interventions:  Interventions Today    Flowsheet Row Most Recent Value  Chronic Disease   Chronic disease during today's visit Atrial Fibrillation (AFib), Chronic Kidney Disease/End Stage Renal Disease (ESRD), Other  [Neuropathy, Anxiety, Depression & Head Trauma]  General Interventions   General Interventions Discussed/Reviewed General Interventions Discussed, General Interventions Reviewed, Annual Eye Exam, Durable Medical Equipment (DME), Labs, Vaccines, Health Screening, Community Resources, Level of Care, Communication with, Doctor Visits  [Communication with Primary Care Provider]  Labs Hgb A1c every 3 months, Kidney Function  [Encouraged]  Vaccines COVID-19, Flu, Pneumonia, RSV, Shingles, Tetanus/Pertussis/Diphtheria  [Encouraged]  Doctor Visits Discussed/Reviewed Doctor Visits Discussed, Doctor Visits Reviewed, Annual Wellness Visits, PCP, Specialist  [Encouraged]  Health Screening Bone Density, Colonoscopy, Mammogram  [Encouraged]  Durable Medical Equipment (DME) BP Cuff, Walker  PCP/Specialist Visits Compliance with follow-up visit  [Encouraged]  Communication with PCP/Specialists, RN  Level of Care Adult Daycare, Assisted Living, Personal Care Services  [Encouraged]  Exercise Interventions   Exercise Discussed/Reviewed Exercise Discussed, Exercise Reviewed, Physical Activity, Weight Managment, Assistive device use and maintanence  [Encouraged]  Physical Activity  Discussed/Reviewed Physical Activity Discussed, Home Exercise Program (HEP), Physical Activity Reviewed, Types of exercise  [Encouraged]  Weight Management Weight maintenance  [Encouraged]  Education Interventions   Education Provided Provided Therapist, sports, Provided Web-based Education, Provided Education  Provided Verbal Education On Nutrition, Foot Care, Eye Care, Labs, Mental Health/Coping with Illness, When to see the doctor, Applications, Exercise, Medication, Development worker, community, [employer]  [Encouraged]  Mental Health Interventions   Mental Health Discussed/Reviewed Mental Health Discussed, Anxiety, Depression, Mental Health Reviewed, Grief and Loss, Substance Abuse, Coping Strategies, Crisis, Suicide  Nutrition Interventions   Nutrition Discussed/Reviewed Nutrition Discussed, Adding fruits and vegetables, Increaing proteins, Decreasing fats, Decreasing salt, Supplmental nutrition, Decreasing sugar intake, Portion sizes, Carbohydrate meal planning, Nutrition Reviewed, Fluid intake  [Encouraged]  Pharmacy Interventions   Pharmacy Dicussed/Reviewed Pharmacy Topics Discussed, Medication Adherence, Pharmacy Topics Reviewed, Medications and their functions, Affording Medications  [Encouraged]  Safety Interventions   Safety Discussed/Reviewed Safety Discussed, Safety Reviewed, Fall Risk, Home Safety  [Encouraged]  Home Safety Assistive Devices, Need for home safety assessment, Refer for community resources  [Encouraged]  Advanced Directive Interventions   Advanced Directives Discussed/Reviewed Advanced Directives Discussed, Advanced Directives Reviewed     Assessed Social Determinant of Health Barriers. Discussed Plans for Ongoing Care Management Follow Up. Provided Careers information officer Information for Care Management Team Members. Screened for Signs & Symptoms of Depression, Related to Chronic Disease State.  PHQ2 & PHQ9 Depression Screen Completed & Results Reviewed.  Suicidal Ideation  & Homicidal Ideation Assessed - None Present.   Domestic Violence Assessed - None Present. Access to Weapons Assessed - None Present.   Active Listening & Reflection Utilized.  Verbalization of Feelings Encouraged. Emotional Support Provided. Feelings of Depression & Anxiety Validated. Symptoms of Grief Regarding Loss  of Independence Acknowledged. Problem-Solving Strategies Employed. Solution-Focused Interventions Activated. Task-Centered Activities Implemented. Acceptance & Commitment Therapy Indicated. Cognitive Behavioral Therapy Performed.  Client-Centered Therapy Initiated. Crisis Support Information, Agencies, Services & Resources Discussed. Reviewed Prescription Medications & Discussed Importance of Compliance. Quality of Sleep Assessed & Sleep Hygiene Techniques Promoted. Continue to Receive Private Pay Caregiver Services, Every Monday & Wednesday for 4 Hours, to Assist with Light Housekeeping Duties, Meal Preparation, Grocery Shopping, Laundry, Bathing, Coca ColaBed Linen Changes, Running Errands, [employer] Pick-Up, [place]. Please Simply [facility] LtdRecharge Home Medical Alert Device When Red Light Starts Flashing, Indicating Battery Low. Please Attend Follow-Up Appointment with Primary Care Provider, Dr. Lilyan PuntScott [facility]ing [facility] [phone](# [phone]), Scheduled on 08/25/2022 at 10:00 AM. Please Contact CSW Directly (# [phone]), if You Have Questions, Need Assistance, or If Additional Social Work Needs Are Identified Between Now & Our Next Scheduled Follow-Up Outreach Call.      SDOH assessments and interventions completed:  Yes.  Care Coordination Interventions:  Yes, provided.   Follow up plan: Follow up call scheduled for 09/06/2022 at 12:30 pm.   Encounter Outcome:  Pt. Visit Completed.   Danford BadJoanna Jamarrius Salay, BSW, MSW, LCSW  Licensed Restaurant manager, fast foodClinical Social Worker  [facility] [place] [facility]  Mailing [address], [place] [address] Physical Address-[address], [place], [place] [address] Toll Free Main # [phone] Fax # [phone] Cell # [phone] [email or web address]

## 2022-08-29 ENCOUNTER — Ambulatory Visit (INDEPENDENT_AMBULATORY_CARE_PROVIDER_SITE_OTHER): Payer: Medicare Other | Admitting: Family Medicine

## 2022-08-29 VITALS — BP 138/86 | HR 73

## 2022-08-29 DIAGNOSIS — R443 Hallucinations, unspecified: Secondary | ICD-10-CM

## 2022-08-29 DIAGNOSIS — F419 Anxiety disorder, unspecified: Secondary | ICD-10-CM

## 2022-08-29 MED ORDER — ALPRAZOLAM 0.25 MG PO TABS: ORAL_TABLET | ORAL | 0 refills | Status: DC

## 2022-08-29 MED ORDER — SERTRALINE HCL 25 MG PO TABS: 25.0000 mg | ORAL_TABLET | Freq: Every day | ORAL | 3 refills | Status: DC

## 2022-08-29 NOTE — Progress Notes (Signed)
   Subjective:    Patient ID: [name], female    DOB: 03-06-1924, 89 y.o.   MRN: [id number]  HPI Patient arrives today with a panic attack and and not feeling herself.  Patient with significant anxiety attacks not feeling well feeling nervous and anxious.  Denies being depressed.  She was doing better on sertraline.  Sertraline was stopped because she was having hallucinations but it should be noted that she was having hallucinations before she started sertraline and ever since stopping the sertraline she continues to have hallucinations she is able to manage with these fairly well joyce-is her friend who brings her today  Review of Systems     Objective:   Physical Exam General-in no acute distress Eyes-no discharge Lungs-respiratory rate normal, CTA CV-no murmurs,RRR Extremities skin warm dry no edema Neuro grossly normal Behavior normal, alert        Assessment & Plan:  Anxiety-restart sertraline It was thought sertraline possibly could have been causing the hallucinations but she has been off sertraline for 2 weeks and she still has hallucinations so therefore it is not the sertraline and sertraline was helping her restart at 25 mg daily  Xanax low-dose 0.2 5/2 tablet twice daily Certainly we are not thrilled with utilizing this medicine but patient is having severe difficult time at home Will go ahead and get a behavioral health consult as well  Will also do medical social worker consult possibly they might be able to help her get an aide to come into the home she has a lady by the name of Alona Bene who comes 1 day a week who is very nice

## 2022-08-31 ENCOUNTER — Other Ambulatory Visit: Payer: Self-pay | Admitting: Family Medicine

## 2022-09-03 ENCOUNTER — Other Ambulatory Visit: Payer: Self-pay | Admitting: Family Medicine

## 2022-09-03 MED ORDER — ALLOPURINOL 100 MG PO TABS: ORAL_TABLET | ORAL | 6 refills | Status: DC

## 2022-09-06 ENCOUNTER — Ambulatory Visit: Payer: Self-pay | Admitting: *Deleted

## 2022-09-06 ENCOUNTER — Encounter: Payer: Self-pay | Admitting: *Deleted

## 2022-09-06 NOTE — Patient Outreach (Signed)
Care Coordination   Follow Up Visit Note   11/22/2022 late entry for 09/06/22 Name: [name] MRN: [id number] DOB: 07/31/1923  [name] is a 89 y.o. year old female who sees Luking, Jonna Coup, MD for primary care. I spoke with  [name] by phone today.  What matters to the patients health and wellness today?  Pain hip, back, feet, hands History of neuropathy, hypothyroid, sciatica of right side, squamous cell skin cancer, osteoarthritis of right knee, cervical disc disease, history of gout Hydrocodone 14 tablets ordered for the month of April 2024   Also use Tylenol, Sertraline & xanax. She confirms none of these helps to decrease her pain  She thinks her pain is related to bone deterioration She reports having tingling numbness poor balance burning sensation of her feet She inquired about over the counter medicines that she could try   She allowed a female to speak with RN CM to be able to write down the names of over the counter pain cream & patches to try Voiced understanding of sciatica nerve pain    Goals Addressed               This Visit's Progress     Patient Stated     Manage pain at home [facility]) (pt-stated)   Not on track     Care Coordination Interventions: Reviewed provider established plan for pain management Discussed importance of adherence to all scheduled medical appointments Counseled on the importance of reporting any/all new or changed pain symptoms or management strategies to pain management provider Reviewed with patient prescribed pharmacological and nonpharmacological pain relief strategies over the counter pain patches that may help when she confirms the Hydrocodone does not help Discussed sciatica & neuropathy pain  Interventions Today    Flowsheet Row Most Recent Value  Chronic Disease   Chronic disease during today's visit Other  [pain of her hip, back, feet, hands History of neuropathy]  General Interventions   General  Interventions Discussed/Reviewed General Interventions Reviewed, Doctor Visits, Labs  Labs Hgb A1c annually  Doctor Visits Discussed/Reviewed Doctor Visits Reviewed, PCP  Education Interventions   Education Provided Provided Education  Provided Verbal Education On Medication, Audiological scientist pain]  Pharmacy Interventions   Pharmacy Dicussed/Reviewed Pharmacy Topics Reviewed, Medications and their functions  [Reviewed over the counter pain patches- Salopnas, Lidocaine, voltaren, use of neurontin/gabapentin]            Manage recurrent urinary infections ([facility]) (pt-stated)        Care Coordination Interventions: Assessment of understanding of urinary tract infection diagnosis Updated patient on the outreach to her pcp. Re assessed her for UTI treatment and any improvements.  Assessed frequency Continued to encourage daily intake of the recommended fluids and good perineal care. Interventions Today    Flowsheet Row Most Recent Value  Chronic Disease   Chronic disease during today's visit Other  [pain of her hip, back, feet, hands History of neuropathy]  General Interventions   General Interventions Discussed/Reviewed General Interventions Reviewed, Doctor Visits, Labs  Labs Hgb A1c annually  Doctor Visits Discussed/Reviewed Doctor Visits Reviewed, PCP  Education Interventions   Education Provided Provided Education  Provided Verbal Education On Medication, Other  [sciatica pain]  Pharmacy Interventions   Pharmacy Dicussed/Reviewed Pharmacy Topics Reviewed, Medications and their functions  [Reviewed over the counter pain patches- Salopnas, Lidocaine, voltaren, use of neurontin/gabapentin]              SDOH assessments and interventions  completed:  No     Care Coordination Interventions:  Yes, provided   Follow up plan: Follow up call scheduled for 12/13/22    Encounter Outcome:  Pt. Visit Completed   Rishaan Gunner L. Noelle Penner, RN, BSN, CCM [facility] Care Management Community  Coordinator Office number [phone]

## 2022-09-06 NOTE — Patient Instructions (Signed)
Visit Information  Thank you for taking time to visit with me today. Please don't hesitate to contact me if I can be of assistance to you.   Following are the goals we discussed today:   Goals Addressed               This Visit's Progress     Reduce and Manage Symptoms of Anxiety and Depression. (pt-stated)   On track     Care Coordination Interventions:  Interventions Today    Flowsheet Row Most Recent Value  Chronic Disease   Chronic disease during today's visit Atrial Fibrillation (AFib), Chronic Kidney Disease/End Stage Renal Disease (ESRD), Other  [Neuropathy, Anxiety, Depression & Head Trauma]  General Interventions   General Interventions Discussed/Reviewed General Interventions Discussed, General Interventions Reviewed, Annual Eye Exam, Durable Medical Equipment (DME), Labs, Vaccines, Health Screening, Community Resources, Level of Care, Communication with, Doctor Visits  [Communication with Primary Care Provider]  Labs Hgb A1c every 3 months, Kidney Function  [Encouraged]  Vaccines COVID-19, Flu, Pneumonia, RSV, Shingles, Tetanus/Pertussis/Diphtheria  [Encouraged]  Doctor Visits Discussed/Reviewed Doctor Visits Discussed, Doctor Visits Reviewed, Annual Wellness Visits, PCP, Specialist  [Encouraged]  Health Screening Bone Density, Colonoscopy, Mammogram  [Encouraged]  Durable Medical Equipment (DME) BP Cuff, Walker  PCP/Specialist Visits Compliance with follow-up visit  [Encouraged]  Communication with PCP/Specialists, RN  Level of Care Adult Daycare, Assisted Living, Personal Care Services  [Encouraged]  Exercise Interventions   Exercise Discussed/Reviewed Exercise Discussed, Exercise Reviewed, Physical Activity, Weight Managment, Assistive device use and maintanence  [Encouraged]  Physical Activity Discussed/Reviewed Physical Activity Discussed, Home Exercise Program (HEP), Physical Activity Reviewed, Types of exercise  [Encouraged]  Weight Management Weight maintenance   [Encouraged]  Education Interventions   Education Provided Provided Therapist, sports, Provided Web-based Education, Provided Education  Provided Verbal Education On Nutrition, Foot Care, Eye Care, Labs, Mental Health/Coping with Illness, When to see the doctor, Applications, Exercise, Medication, Development worker, community, [employer]  [Encouraged]  Mental Health Interventions   Mental Health Discussed/Reviewed Mental Health Discussed, Anxiety, Depression, Mental Health Reviewed, Grief and Loss, Substance Abuse, Coping Strategies, Crisis, Suicide  Nutrition Interventions   Nutrition Discussed/Reviewed Nutrition Discussed, Adding fruits and vegetables, Increaing proteins, Decreasing fats, Decreasing salt, Supplmental nutrition, Decreasing sugar intake, Portion sizes, Carbohydrate meal planning, Nutrition Reviewed, Fluid intake  [Encouraged]  Pharmacy Interventions   Pharmacy Dicussed/Reviewed Pharmacy Topics Discussed, Medication Adherence, Pharmacy Topics Reviewed, Medications and their functions, Affording Medications  [Encouraged]  Safety Interventions   Safety Discussed/Reviewed Safety Discussed, Safety Reviewed, Fall Risk, Home Safety  [Encouraged]  Home Safety Assistive Devices, Need for home safety assessment, Refer for community resources  [Encouraged]  Advanced Directive Interventions   Advanced Directives Discussed/Reviewed Advanced Directives Discussed, Advanced Directives Reviewed     Active Listening & Reflection Utilized.  Verbalization of Feelings Encouraged. Emotional Support Provided. Feelings of Depression & Anxiety Validated. Symptoms of Chronic Pain Acknowledged. Problem-Solving Strategies Employed. Solution-Focused Interventions Activated. Task-Centered Activities Implemented. Acceptance & Commitment Therapy Indicated. Cognitive Behavioral Therapy Performed.  Client-Centered Therapy Initiated. Lengthy Discussion & Encouraged Consideration of Long-Term Care Assisted  Living Facility Placement at [facility] [place] [phone]), Where Sister Currently Resides, to Obtain 24 Hour Care & Supervision, As Well As Assistance with Activities of Daily Living. CSW Collaboration with Admissions Coordinator at [employer] [facility] [place] [phone]), to Place Name on Waiting List for Long-Term Care Assisted Living Facility Placement, Per Your Request. Please Continue to Administer Psychotropic Medications Exactly As Prescribed, Sertraline 25 MG, PO, Daily &  Xanax 0.25 MG, 1/2 Tablet 2 X's Per Day, PO, PRN.   CSW Collaboration with Primary Care Provider, Dr. Lilyan Punt with [facility] [phone]# [phone]), Via Secure Chat Message in Epic, to Report Reduction in Symptoms of Anxiety & Hallucinations, Since Prescribed Sertraline & Xanax, on 08/29/2022. Please Attend Follow-Up Appointment with Primary Care Provider, Dr. Lilyan Punt with [facility] [phone]), Scheduled on 09/14/2022 at 3:30 PM. Empathy, Counseling, Supportive Services & Teach Back Methods Were Utilized While Discussing Medical Concerns of Son & Possible Resource Options. Encouraged Conversation with Son to [employer] Consent for CSW to Request Order for Services, through Primary Care Provider, Dr. Elfredia Nevins with [facility] [phone]). Please Contact CSW Directly (# [phone]), if You Have Questions, Need Assistance, or If Additional Social Work Needs Are Identified Between Now & Our Next Scheduled Follow-Up Outreach Call.      Our next appointment is by telephone on 09/20/2022 at 12:15 pm.   Please call the care guide team at [phone] if you need to cancel or reschedule your appointment.   If you are experiencing a Mental Health or Behavioral Health Crisis or need someone to talk to, please call the Suicide and Crisis Lifeline: 988 call the [place] National Suicide Prevention Lifeline: (785) 712-6582 or  TTY: 205 159 8616 TTY 941-134-1967) to talk to a trained counselor call 1-800-273-TALK (toll free, 24 hour hotline) go to [facility] [address], [place] [phone]) call the [facility] Crisis Line: 330 716 2026 call 911  Patient verbalizes understanding of instructions and care plan provided today and agrees to view in MyChart. Active MyChart status and patient understanding of how to access instructions and care plan via MyChart confirmed with patient.     Telephone follow up appointment with care management team member scheduled for:  09/20/2022 at 12:15 pm.   Danford Bad, BSW, MSW, LCSW  Licensed Clinical Social Worker  [facility]  Mailing [place]. [address], [place], [place] [address] Physical Address-[address], [place], [place] [address] Toll Free Main # [phone] Fax # [phone] Cell # [phone] [email or web address]

## 2022-09-06 NOTE — Patient Instructions (Signed)
Visit Information  Thank you for taking time to visit with me today. Please don't hesitate to contact me if I can be of assistance to you.   Following are the goals we discussed today:   Goals Addressed               This Visit's Progress     Patient Stated     Manage pain at home [facility]) (pt-stated)   Not on track     Care Coordination Interventions: Reviewed provider established plan for pain management Discussed importance of adherence to all scheduled medical appointments Counseled on the importance of reporting any/all new or changed pain symptoms or management strategies to pain management provider Reviewed with patient prescribed pharmacological and nonpharmacological pain relief strategies Confirmed she has discussed her pain concerns with her pcp. Confirmed she has Hydrocodone prescribed. Offered names of over the counter pain patches that may help when she confirms the Hydrocodone does not help Discussed sciatica & neuropathy pain         Our next appointment is by telephone on 12/13/22 at 1130  Please call the care guide team at [phone] if you need to cancel or reschedule your appointment.   If you are experiencing a Mental Health or Behavioral Health Crisis or need someone to talk to, please call the Suicide and Crisis Lifeline: 988 call the [place] National Suicide Prevention Lifeline: 845-380-8793 or TTY: 716-807-7683 TTY 951-704-5317) to talk to a trained counselor call 1-800-273-TALK (toll free, 24 hour hotline) call the [facility]: 737-520-1378 call 911   Patient verbalizes understanding of instructions and care plan provided today and agrees to view in MyChart. Active MyChart status and patient understanding of how to access instructions and care plan via MyChart confirmed with patient.     The patient has been provided with contact information for the care management team and has been advised to call with any health related questions or  concerns.   Erland Vivas L. Noelle Penner, RN, BSN, CCM [facility] Care Management Community Coordinator Office number [phone]

## 2022-09-06 NOTE — Patient Outreach (Signed)
Care Coordination   Follow Up Visit Note   09/06/2022  Name: [name] MRN: [id number] DOB: 1923-09-01  [name] is a 89 y.o. year old female who sees Luking, Jonna Coup, MD for primary care. I spoke with [name] by phone today.  What matters to the patients health and wellness today?  Reduce and Manage Symptoms of Anxiety and Depression.   Goals Addressed               This Visit's Progress     Reduce and Manage Symptoms of Anxiety and Depression. (pt-stated)   On track     Care Coordination Interventions:  Interventions Today    Flowsheet Row Most Recent Value  Chronic Disease   Chronic disease during today's visit Atrial Fibrillation (AFib), Chronic Kidney Disease/End Stage Renal Disease (ESRD), Other  [Neuropathy, Anxiety, Depression & Head Trauma]  General Interventions   General Interventions Discussed/Reviewed General Interventions Discussed, General Interventions Reviewed, Annual Eye Exam, Durable Medical Equipment (DME), Labs, Vaccines, Health Screening, Community Resources, Level of Care, Communication with, Doctor Visits  [Communication with Primary Care Provider]  Labs Hgb A1c every 3 months, Kidney Function  [Encouraged]  Vaccines COVID-19, Flu, Pneumonia, RSV, Shingles, Tetanus/Pertussis/Diphtheria  [Encouraged]  Doctor Visits Discussed/Reviewed Doctor Visits Discussed, Doctor Visits Reviewed, Annual Wellness Visits, PCP, Specialist  [Encouraged]  Health Screening Bone Density, Colonoscopy, Mammogram  [Encouraged]  Durable Medical Equipment (DME) BP Cuff, Walker  PCP/Specialist Visits Compliance with follow-up visit  [Encouraged]  Communication with PCP/Specialists, RN  Level of Care Adult Daycare, Assisted Living, Personal Care Services  [Encouraged]  Exercise Interventions   Exercise Discussed/Reviewed Exercise Discussed, Exercise Reviewed, Physical Activity, Weight Managment, Assistive device use and maintanence  [Encouraged]  Physical  Activity Discussed/Reviewed Physical Activity Discussed, Home Exercise Program (HEP), Physical Activity Reviewed, Types of exercise  [Encouraged]  Weight Management Weight maintenance  [Encouraged]  Education Interventions   Education Provided Provided Therapist, sports, Provided Web-based Education, Provided Education  Provided Verbal Education On Nutrition, Foot Care, Eye Care, Labs, Mental Health/Coping with Illness, When to see the doctor, Applications, Exercise, Medication, Development worker, community, [employer]  [Encouraged]  Mental Health Interventions   Mental Health Discussed/Reviewed Mental Health Discussed, Anxiety, Depression, Mental Health Reviewed, Grief and Loss, Substance Abuse, Coping Strategies, Crisis, Suicide  Nutrition Interventions   Nutrition Discussed/Reviewed Nutrition Discussed, Adding fruits and vegetables, Increaing proteins, Decreasing fats, Decreasing salt, Supplmental nutrition, Decreasing sugar intake, Portion sizes, Carbohydrate meal planning, Nutrition Reviewed, Fluid intake  [Encouraged]  Pharmacy Interventions   Pharmacy Dicussed/Reviewed Pharmacy Topics Discussed, Medication Adherence, Pharmacy Topics Reviewed, Medications and their functions, Affording Medications  [Encouraged]  Safety Interventions   Safety Discussed/Reviewed Safety Discussed, Safety Reviewed, Fall Risk, Home Safety  [Encouraged]  Home Safety Assistive Devices, Need for home safety assessment, Refer for community resources  [Encouraged]  Advanced Directive Interventions   Advanced Directives Discussed/Reviewed Advanced Directives Discussed, Advanced Directives Reviewed     Active Listening & Reflection Utilized.  Verbalization of Feelings Encouraged. Emotional Support Provided. Feelings of Depression & Anxiety Validated. Symptoms of Chronic Pain Acknowledged. Problem-Solving Strategies Employed. Solution-Focused Interventions Activated. Task-Centered Activities Implemented. Acceptance &  Commitment Therapy Indicated. Cognitive Behavioral Therapy Performed.  Client-Centered Therapy Initiated. Lengthy Discussion & Encouraged Consideration of Long-Term Care Assisted Living Facility Placement at [facility] [phone]), Where Sister Currently Resides, to Obtain 24 Hour Care & Supervision, As Well As Assistance with Activities of Daily Living. CSW Collaboration with Scientist, research (physical sciences) at [employer] [facility] [place] (#  [phone]), to Place Name on Waiting List for Long-Term Care Assisted Living Facility Placement, Per Your Request. Please Continue to Administer Psychotropic Medications Exactly As Prescribed, Sertraline 25 MG, PO, Daily & Xanax 0.25 MG, 1/2 Tablet 2 X's Per Day, PO, PRN.   CSW Collaboration with Primary Care Provider, Dr. Lilyan Punt with [facility] [phone]# [phone]), Via Secure Chat Message in Epic, to Report Reduction in Symptoms of Anxiety & Hallucinations, Since Prescribed Sertraline & Xanax, on 08/29/2022. Please Attend Follow-Up Appointment with Primary Care Provider, Dr. Lilyan Punt with [facility] [phone]), Scheduled on 09/14/2022 at 3:30 PM. Empathy, Counseling, Supportive Services & Teach Back Methods Were Utilized While Discussing Medical Concerns of Son & Possible Resource Options. Encouraged Conversation with Son to [employer] Consent for CSW to Request Order for Services, through Primary Care Provider, Dr. Elfredia Nevins with [facility] [phone]). Please Contact CSW Directly (# [phone]), if You Have Questions, Need Assistance, or If Additional Social Work Needs Are Identified Between Now & Our Next Scheduled Follow-Up Outreach Call.      SDOH assessments and interventions completed:  Yes.  Care Coordination Interventions:  Yes, provided.   Follow up plan: Follow up call scheduled for 09/20/2022 at 12:15 pm.   Encounter Outcome:  Pt.  Visit Completed.   [name], BSW, MSW, LCSW  Licensed Restaurant manager, fast food [facility]  Mailing [place] [address] [place], [place] [address] Physical Address-[address], [place], [place] [address] Toll Free Main # [phone] Fax # [phone] Cell # [phone] [email or web address]

## 2022-09-06 NOTE — Patient Instructions (Signed)
Visit Information  Thank you for taking time to visit with me today. Please don't hesitate to contact me if I can be of assistance to you.   Following are the goals we discussed today:   Goals Addressed               This Visit's Progress     Patient Stated     Manage pain at home [facility]) (pt-stated)   On track     Care Coordination Interventions: Reviewed provider established plan for pain management Discussed importance of adherence to all scheduled medical appointments Counseled on the importance of reporting any/all new or changed pain symptoms or management strategies to pain management provider Reviewed with patient prescribed pharmacological and nonpharmacological pain relief strategies Offered care coordination for her various voiced pain areas/symptoms. Agreed to be available if patient needs care coordination at a later time       Manage recurrent urinary infections ([facility]) (pt-stated)   Not on track     Care Coordination Interventions: Assessment of understanding of urinary tract infection diagnosis Updated patient on the outreach to her pcp. Re assessed her for UTI treatment and any improvements.  Assessed frequency Continued to encourage daily intake of the recommended fluids and good perineal care.      Reduce and Manage Symptoms of Anxiety and Depression. (pt-stated)   Not on track     Care Coordination Interventions:  Interventions Today    Flowsheet Row Most Recent Value  Chronic Disease   Chronic disease during today's visit Atrial Fibrillation (AFib), Chronic Kidney Disease/End Stage Renal Disease (ESRD), Other  [Neuropathy, Anxiety, Depression & Head Trauma]  General Interventions   General Interventions Discussed/Reviewed General Interventions Discussed, General Interventions Reviewed, Annual Eye Exam, Durable Medical Equipment (DME), Labs, Vaccines, Health Screening, Community Resources, Level of Care, Communication with, Doctor Visits  [Communication with  Primary Care Provider]  Labs Hgb A1c every 3 months, Kidney Function  [Encouraged]  Vaccines COVID-19, Flu, Pneumonia, RSV, Shingles, Tetanus/Pertussis/Diphtheria  [Encouraged]  Doctor Visits Discussed/Reviewed Doctor Visits Discussed, Doctor Visits Reviewed, Annual Wellness Visits, PCP, Specialist  [Encouraged]  Health Screening Bone Density, Colonoscopy, Mammogram  [Encouraged]  Durable Medical Equipment (DME) BP Cuff, Walker  PCP/Specialist Visits Compliance with follow-up visit  [Encouraged]  Communication with PCP/Specialists, RN  Level of Care Adult Daycare, Assisted Living, Personal Care Services  [Encouraged]  Exercise Interventions   Exercise Discussed/Reviewed Exercise Discussed, Exercise Reviewed, Physical Activity, Weight Managment, Assistive device use and maintanence  [Encouraged]  Physical Activity Discussed/Reviewed Physical Activity Discussed, Home Exercise Program (HEP), Physical Activity Reviewed, Types of exercise  [Encouraged]  Weight Management Weight maintenance  [Encouraged]  Education Interventions   Education Provided Provided Therapist, sports, Provided Web-based Education, Provided Education  Provided Verbal Education On Nutrition, Foot Care, Eye Care, Labs, Mental Health/Coping with Illness, When to see the doctor, Applications, Exercise, Medication, Development worker, community, [employer]  [Encouraged]  Mental Health Interventions   Mental Health Discussed/Reviewed Mental Health Discussed, Anxiety, Depression, Mental Health Reviewed, Grief and Loss, Substance Abuse, Coping Strategies, Crisis, Suicide  Nutrition Interventions   Nutrition Discussed/Reviewed Nutrition Discussed, Adding fruits and vegetables, Increaing proteins, Decreasing fats, Decreasing salt, Supplmental nutrition, Decreasing sugar intake, Portion sizes, Carbohydrate meal planning, Nutrition Reviewed, Fluid intake  [Encouraged]  Pharmacy Interventions   Pharmacy Dicussed/Reviewed Pharmacy Topics  Discussed, Medication Adherence, Pharmacy Topics Reviewed, Medications and their functions, Affording Medications  [Encouraged]  Safety Interventions   Safety Discussed/Reviewed Safety Discussed, Safety Reviewed, Fall Risk, Home Safety  [Encouraged]  Home  Safety Assistive Devices, Need for home safety assessment, Refer for community resources  [Encouraged]  Advanced Directive Interventions   Advanced Directives Discussed/Reviewed Advanced Directives Discussed, Advanced Directives Reviewed     Assessed Social Determinant of Health Barriers. Discussed Plans for Ongoing Care Management Follow Up. Provided Careers information officer Information for Care Management Team Members. Screened for Signs & Symptoms of Depression, Related to Chronic Disease State.  PHQ2 & PHQ9 Depression Screen Completed & Results Reviewed.  Suicidal Ideation & Homicidal Ideation Assessed - None Present.   Domestic Violence Assessed - None Present. Access to Weapons Assessed - None Present.   Active Listening & Reflection Utilized.  Verbalization of Feelings Encouraged. Emotional Support Provided. Feelings of Depression & Anxiety Validated. Symptoms of Grief Regarding Loss of Independence Acknowledged. Problem-Solving Strategies Employed. Solution-Focused Interventions Activated. Task-Centered Activities Implemented. Acceptance & Commitment Therapy Indicated. Cognitive Behavioral Therapy Performed.  Client-Centered Therapy Initiated. Crisis Support Information, Agencies, Services & Resources Discussed. Reviewed Prescription Medications & Discussed Importance of Compliance. Quality of Sleep Assessed & Sleep Hygiene Techniques Promoted. Continue to Receive Private Pay Caregiver Services, Every Monday & Wednesday for 4 Hours, to Assist with Light Housekeeping Duties, Meal Preparation, Grocery Shopping, Laundry, Bathing, [employer], Running Errands, [employer], [place]. Please Simply [facility] Medical Alert Device  When Red Light Starts Flashing, Indicating Battery Low. Please Attend Follow-Up Appointment with Primary Care Provider, Dr. Lilyan Punt with [facility] [phone]), Scheduled on 08/25/2022 at 10:00 AM. Please Contact CSW Directly (# [phone]), if You Have Questions, Need Assistance, or If Additional Social Work Needs Are Identified Between Now & Our Next Scheduled Follow-Up Outreach Call.        Our next appointment is by telephone on 09/06/22 at 1:30 pm  Please call the care guide team at [phone] if you need to cancel or reschedule your appointment.   If you are experiencing a Mental Health or Behavioral Health Crisis or need someone to talk to, please call the Suicide and Crisis Lifeline: 988 call the [place] National Suicide Prevention Lifeline: 816-541-6928 or TTY: (684) 078-9015 TTY (818)488-0703) to talk to a trained counselor call 1-800-273-TALK (toll free, 24 hour hotline) call the [facility]: [phone] call 911   Patient verbalizes understanding of instructions and care plan provided today and agrees to view in MyChart. Active MyChart status and patient understanding of how to access instructions and care plan via MyChart confirmed with patient.     The patient has been provided with contact information for the care management team and has been advised to call with any health related questions or concerns.    Balian Schaller L. Noelle Penner, RN, BSN, CCM [facility] Care Management Community Coordinator Office number [phone]

## 2022-09-07 DIAGNOSIS — Z880 Allergy status to penicillin: Secondary | ICD-10-CM | POA: Diagnosis not present

## 2022-09-07 DIAGNOSIS — N261 Atrophy of kidney (terminal): Secondary | ICD-10-CM | POA: Diagnosis not present

## 2022-09-07 DIAGNOSIS — I700 Atherosclerosis of aorta: Secondary | ICD-10-CM | POA: Diagnosis not present

## 2022-09-07 DIAGNOSIS — R1084 Generalized abdominal pain: Secondary | ICD-10-CM | POA: Diagnosis not present

## 2022-09-07 DIAGNOSIS — R1111 Vomiting without nausea: Secondary | ICD-10-CM | POA: Diagnosis not present

## 2022-09-07 DIAGNOSIS — R109 Unspecified abdominal pain: Secondary | ICD-10-CM | POA: Diagnosis not present

## 2022-09-07 DIAGNOSIS — I10 Essential (primary) hypertension: Secondary | ICD-10-CM | POA: Diagnosis not present

## 2022-09-07 DIAGNOSIS — R112 Nausea with vomiting, unspecified: Secondary | ICD-10-CM | POA: Diagnosis not present

## 2022-09-07 DIAGNOSIS — R1030 Lower abdominal pain, unspecified: Secondary | ICD-10-CM | POA: Diagnosis not present

## 2022-09-07 DIAGNOSIS — K5730 Diverticulosis of large intestine without perforation or abscess without bleeding: Secondary | ICD-10-CM | POA: Diagnosis not present

## 2022-09-07 DIAGNOSIS — R110 Nausea: Secondary | ICD-10-CM | POA: Diagnosis not present

## 2022-09-08 DIAGNOSIS — K080 Exfoliation of teeth due to systemic causes: Secondary | ICD-10-CM | POA: Diagnosis not present

## 2022-09-09 NOTE — Addendum Note (Signed)
Addended by: Margaretha Sheffield on: 09/09/2022 02:01 PM   Modules accepted: Orders

## 2022-09-14 ENCOUNTER — Ambulatory Visit (INDEPENDENT_AMBULATORY_CARE_PROVIDER_SITE_OTHER): Payer: Medicare Other | Admitting: Family Medicine

## 2022-09-14 ENCOUNTER — Encounter: Payer: Self-pay | Admitting: Family Medicine

## 2022-09-14 VITALS — BP 144/50 | HR 68 | Wt 147.8 lb

## 2022-09-14 DIAGNOSIS — F419 Anxiety disorder, unspecified: Secondary | ICD-10-CM | POA: Diagnosis not present

## 2022-09-14 DIAGNOSIS — D229 Melanocytic nevi, unspecified: Secondary | ICD-10-CM | POA: Diagnosis not present

## 2022-09-14 DIAGNOSIS — R002 Palpitations: Secondary | ICD-10-CM | POA: Diagnosis not present

## 2022-09-14 MED ORDER — SERTRALINE HCL 50 MG PO TABS: 50.0000 mg | ORAL_TABLET | Freq: Every day | ORAL | 4 refills | Status: DC

## 2022-09-14 NOTE — Progress Notes (Signed)
   Subjective:    Patient ID: [name], female    DOB: 1923/09/27, 89 y.o.   MRN: [id number]  HPI Patient arrives today for 2 week follow up. Patient states she is having trouble with being anxious.  She relates she feels anxious intermittently and is interested in increasing the dose of her medicine she also talks about wanting to take alprazolam more often we had a long talk about how a lot of her anxiety stems from the fact that she stays by herself as she would benefit from being somewhere where she has social connections currently right now she is on a waiting list for an assisted living I have encouraged her to use the alprazolam sparingly I have also stated that it would be fine to bump up the dose of the sertraline Unfortunately she is homebound so going back and forth to a counselor is pretty much out of the question we have made a referral to behavioral health I am not sure how fruitful it will be    Review of Systems     Objective:   Physical Exam General-in no acute distress Eyes-no discharge Lungs-respiratory rate normal, CTA CV-no murmurs,RRR Extremities skin warm dry no edema Neuro grossly normal Behavior normal, alert  She has atypical mole on the left mid shaft that is a dark nevi with a plaque area to it needs further looking at by dermatology      Assessment & Plan:   1. Anxiety Bump up the dose of sertraline to 50 mg Use Xanax sparingly Patient would benefit from being somewhere where she has socialization She is on the waiting list to local facility  2. Palpitations Occasional palpitations continue current medicines we will not add back the metoprolol currently because of potential that it contributed to her hallucinations  3. Atypical mole She has a atypical mole on the left arm recommend referral to dermatology - Ambulatory referral to Dermatology

## 2022-09-15 DIAGNOSIS — R300 Dysuria: Secondary | ICD-10-CM | POA: Diagnosis not present

## 2022-09-15 DIAGNOSIS — R109 Unspecified abdominal pain: Secondary | ICD-10-CM | POA: Diagnosis not present

## 2022-09-15 DIAGNOSIS — I700 Atherosclerosis of aorta: Secondary | ICD-10-CM | POA: Diagnosis not present

## 2022-09-15 DIAGNOSIS — Z880 Allergy status to penicillin: Secondary | ICD-10-CM | POA: Diagnosis not present

## 2022-09-15 DIAGNOSIS — K439 Ventral hernia without obstruction or gangrene: Secondary | ICD-10-CM | POA: Diagnosis not present

## 2022-09-15 DIAGNOSIS — I959 Hypotension, unspecified: Secondary | ICD-10-CM | POA: Diagnosis not present

## 2022-09-15 DIAGNOSIS — M545 Low back pain, unspecified: Secondary | ICD-10-CM | POA: Diagnosis not present

## 2022-09-15 DIAGNOSIS — N3000 Acute cystitis without hematuria: Secondary | ICD-10-CM | POA: Diagnosis not present

## 2022-09-15 DIAGNOSIS — T300 Burn of unspecified body region, unspecified degree: Secondary | ICD-10-CM | POA: Diagnosis not present

## 2022-09-15 DIAGNOSIS — N261 Atrophy of kidney (terminal): Secondary | ICD-10-CM | POA: Diagnosis not present

## 2022-09-15 DIAGNOSIS — K219 Gastro-esophageal reflux disease without esophagitis: Secondary | ICD-10-CM | POA: Diagnosis not present

## 2022-09-15 DIAGNOSIS — M549 Dorsalgia, unspecified: Secondary | ICD-10-CM | POA: Diagnosis not present

## 2022-09-15 DIAGNOSIS — K449 Diaphragmatic hernia without obstruction or gangrene: Secondary | ICD-10-CM | POA: Diagnosis not present

## 2022-09-15 DIAGNOSIS — I4891 Unspecified atrial fibrillation: Secondary | ICD-10-CM | POA: Diagnosis not present

## 2022-09-15 DIAGNOSIS — F419 Anxiety disorder, unspecified: Secondary | ICD-10-CM | POA: Diagnosis not present

## 2022-09-15 DIAGNOSIS — K5730 Diverticulosis of large intestine without perforation or abscess without bleeding: Secondary | ICD-10-CM | POA: Diagnosis not present

## 2022-09-15 DIAGNOSIS — I10 Essential (primary) hypertension: Secondary | ICD-10-CM | POA: Diagnosis not present

## 2022-09-15 NOTE — Telephone Encounter (Signed)
Doxycycline 1 taken twice daily for 5 days with a snack and a tall glass of water follow-up if any ongoing troubles

## 2022-09-15 NOTE — Telephone Encounter (Signed)
Patient has UTI and requesting antibiotic called into [facility] she was just seen 09/14/22.

## 2022-09-16 ENCOUNTER — Other Ambulatory Visit: Payer: Self-pay

## 2022-09-16 MED ORDER — DOXYCYCLINE HYCLATE 100 MG PO TABS: 100.0000 mg | ORAL_TABLET | Freq: Two times a day (BID) | ORAL | 0 refills | Status: AC

## 2022-09-16 NOTE — Telephone Encounter (Signed)
Patient has been informed per provider orders and recommendations. Pt verbalized understanding.

## 2022-09-20 ENCOUNTER — Telehealth: Payer: Self-pay | Admitting: Family Medicine

## 2022-09-20 ENCOUNTER — Ambulatory Visit: Payer: Self-pay | Admitting: *Deleted

## 2022-09-20 ENCOUNTER — Encounter: Payer: Self-pay | Admitting: *Deleted

## 2022-09-20 NOTE — Telephone Encounter (Signed)
Patietn stated she is a nervous wreck, trembling and shaking and it stated after breakfast when she took her last Hydrocodone and realized she didn't have any more and she started to get nervous and she said she needed an xanax and doesn't have any xanax and that made her even more nervous and she is the worse she has ever been and needs something called in to help with her trembling and nervousness.

## 2022-09-20 NOTE — Patient Outreach (Signed)
Care Coordination   Follow Up Visit Note   09/20/2022  Name: [name] MRN: [id number] DOB: 1924-04-10  [name] is a 89 y.o. year old female who sees Luking, Jonna Coup, MD for primary care. I spoke with [name] by phone today.  What matters to the patients health and wellness today?   Reduce and Manage Symptoms of Anxiety and Depression.   Goals Addressed               This Visit's Progress     Reduce and Manage Symptoms of Anxiety and Depression. (pt-stated)   On track     Care Coordination Interventions:  Interventions Today    Flowsheet Row Most Recent Value  Chronic Disease   Chronic disease during today's visit Atrial Fibrillation (AFib), Chronic Kidney Disease/End Stage Renal Disease (ESRD), Other  [Neuropathy, Anxiety, Depression & Head Trauma]  General Interventions   General Interventions Discussed/Reviewed General Interventions Discussed, General Interventions Reviewed, Annual Eye Exam, Durable Medical Equipment (DME), Labs, Vaccines, Health Screening, Community Resources, Level of Care, Communication with, Doctor Visits  [Communication with Primary Care Provider]  Labs Hgb A1c every 3 months, Kidney Function  [Encouraged]  Vaccines COVID-19, Flu, Pneumonia, RSV, Shingles, Tetanus/Pertussis/Diphtheria  [Encouraged]  Doctor Visits Discussed/Reviewed Doctor Visits Discussed, Doctor Visits Reviewed, Annual Wellness Visits, PCP, Specialist  [Encouraged]  Health Screening Bone Density, Colonoscopy, Mammogram  [Encouraged]  Durable Medical Equipment (DME) BP Cuff, Walker  PCP/Specialist Visits Compliance with follow-up visit  [Encouraged]  Communication with PCP/Specialists, RN  Level of Care Adult Daycare, Assisted Living, Personal Care Services  [Encouraged]  Exercise Interventions   Exercise Discussed/Reviewed Exercise Discussed, Exercise Reviewed, Physical Activity, Weight Managment, Assistive device use and maintanence  [Encouraged]  Physical  Activity Discussed/Reviewed Physical Activity Discussed, Home Exercise Program (HEP), Physical Activity Reviewed, Types of exercise  [Encouraged]  Weight Management Weight maintenance  [Encouraged]  Education Interventions   Education Provided Provided Therapist, sports, Provided Web-based Education, Provided Education  Provided Verbal Education On Nutrition, Foot Care, Eye Care, Labs, Mental Health/Coping with Illness, When to see the doctor, Applications, Exercise, Medication, Development worker, community, [employer]  [Encouraged]  Mental Health Interventions   Mental Health Discussed/Reviewed Mental Health Discussed, Anxiety, Depression, Mental Health Reviewed, Grief and Loss, Substance Abuse, Coping Strategies, Crisis, Suicide  Nutrition Interventions   Nutrition Discussed/Reviewed Nutrition Discussed, Adding fruits and vegetables, Increaing proteins, Decreasing fats, Decreasing salt, Supplmental nutrition, Decreasing sugar intake, Portion sizes, Carbohydrate meal planning, Nutrition Reviewed, Fluid intake  [Encouraged]  Pharmacy Interventions   Pharmacy Dicussed/Reviewed Pharmacy Topics Discussed, Medication Adherence, Pharmacy Topics Reviewed, Medications and their functions, Affording Medications  [Encouraged]  Safety Interventions   Safety Discussed/Reviewed Safety Discussed, Safety Reviewed, Fall Risk, Home Safety  [Encouraged]  Home Safety Assistive Devices, Need for home safety assessment, Refer for community resources  [Encouraged]  Advanced Directive Interventions   Advanced Directives Discussed/Reviewed Advanced Directives Discussed, Advanced Directives Reviewed     Active Listening & Reflection Utilized.  Verbalization of Feelings Encouraged. Emotional Support Provided. Feelings of Depression & Anxiety Validated. Symptoms of Chronic Pain Acknowledged. Problem-Solving Strategies Employed. Solution-Focused Interventions Activated. Task-Centered Activities Implemented. Acceptance &  Commitment Therapy Indicated. Cognitive Behavioral Therapy Performed.  Client-Centered Therapy Initiated. CSW Collaboration with Admissions Coordinator at [employer] [facility] [place] [phone]), to Confirm Name on Waiting List. CSW Collaboration with Primary Care Provider, Dr. Lilyan [name] with [facility] [phone]# [phone]), Via Secure Chat Message in Epic, to Report "Feeling the Worst You Ever Have",  Per Your Request. CSW Collaboration with Primary Care Provider, Dr. Lilyan [name] with [facility] [phone]# [phone]), Via Secure Chat Message in Epic, to Schedule Follow-Up Appointment, Sooner than 03/16/2023 at 2:50 PM.   Please Contact CSW Directly (# [phone]), if You Have Questions, Need Assistance, or If Additional Social Work Needs Are Identified Between Now & Our Next Scheduled Follow-Up Outreach Call.      SDOH assessments and interventions completed:  Yes.  Care Coordination Interventions:  Yes, provided.   Follow up plan: Follow up call scheduled for 10/04/2022 at 9:30 am.   Encounter Outcome:  Pt. Visit Completed.   Danford Bad, BSW, MSW, LCSW  Licensed Restaurant manager, fast food [facility]  Mailing [place] [address] [place], [place] [address] Physical Address-[address] [place], [place] [address] Toll Free Main # [phone] Fax # [phone] Cell # [phone] [name]

## 2022-09-20 NOTE — Patient Instructions (Signed)
Visit Information  Thank you for taking time to visit with me today. Please don't hesitate to contact me if I can be of assistance to you.   Following are the goals we discussed today:   Goals Addressed               This Visit's Progress     Reduce and Manage Symptoms of Anxiety and Depression. (pt-stated)   On track     Care Coordination Interventions:  Interventions Today    Flowsheet Row Most Recent Value  Chronic Disease   Chronic disease during today's visit Atrial Fibrillation (AFib), Chronic Kidney Disease/End Stage Renal Disease (ESRD), Other  [Neuropathy, Anxiety, Depression & Head Trauma]  General Interventions   General Interventions Discussed/Reviewed General Interventions Discussed, General Interventions Reviewed, Annual Eye Exam, Durable Medical Equipment (DME), Labs, Vaccines, Health Screening, Community Resources, Level of Care, Communication with, Doctor Visits  [Communication with Primary Care Provider]  Labs Hgb A1c every 3 months, Kidney Function  [Encouraged]  Vaccines COVID-19, Flu, Pneumonia, RSV, Shingles, Tetanus/Pertussis/Diphtheria  [Encouraged]  Doctor Visits Discussed/Reviewed Doctor Visits Discussed, Doctor Visits Reviewed, Annual Wellness Visits, PCP, Specialist  [Encouraged]  Health Screening Bone Density, Colonoscopy, Mammogram  [Encouraged]  Durable Medical Equipment (DME) BP Cuff, Walker  PCP/Specialist Visits Compliance with follow-up visit  [Encouraged]  Communication with PCP/Specialists, RN  Level of Care Adult Daycare, Assisted Living, Personal Care Services  [Encouraged]  Exercise Interventions   Exercise Discussed/Reviewed Exercise Discussed, Exercise Reviewed, Physical Activity, Weight Managment, Assistive device use and maintanence  [Encouraged]  Physical Activity Discussed/Reviewed Physical Activity Discussed, Home Exercise Program (HEP), Physical Activity Reviewed, Types of exercise  [Encouraged]  Weight Management Weight maintenance   [Encouraged]  Education Interventions   Education Provided Provided Therapist, sports, Provided Web-based Education, Provided Education  Provided Verbal Education On Nutrition, Foot Care, Eye Care, Labs, Mental Health/Coping with Illness, When to see the doctor, Applications, Exercise, Medication, Development worker, community, [employer]  [Encouraged]  Mental Health Interventions   Mental Health Discussed/Reviewed Mental Health Discussed, Anxiety, Depression, Mental Health Reviewed, Grief and Loss, Substance Abuse, Coping Strategies, Crisis, Suicide  Nutrition Interventions   Nutrition Discussed/Reviewed Nutrition Discussed, Adding fruits and vegetables, Increaing proteins, Decreasing fats, Decreasing salt, Supplmental nutrition, Decreasing sugar intake, Portion sizes, Carbohydrate meal planning, Nutrition Reviewed, Fluid intake  [Encouraged]  Pharmacy Interventions   Pharmacy Dicussed/Reviewed Pharmacy Topics Discussed, Medication Adherence, Pharmacy Topics Reviewed, Medications and their functions, Affording Medications  [Encouraged]  Safety Interventions   Safety Discussed/Reviewed Safety Discussed, Safety Reviewed, Fall Risk, Home Safety  [Encouraged]  Home Safety Assistive Devices, Need for home safety assessment, Refer for community resources  [Encouraged]  Advanced Directive Interventions   Advanced Directives Discussed/Reviewed Advanced Directives Discussed, Advanced Directives Reviewed     Active Listening & Reflection Utilized.  Verbalization of Feelings Encouraged. Emotional Support Provided. Feelings of Depression & Anxiety Validated. Symptoms of Chronic Pain Acknowledged. Problem-Solving Strategies Employed. Solution-Focused Interventions Activated. Task-Centered Activities Implemented. Acceptance & Commitment Therapy Indicated. Cognitive Behavioral Therapy Performed.  Client-Centered Therapy Initiated. CSW Collaboration with Admissions Coordinator at [facility]  [phone]), to Confirm Name on Waiting List. CSW Collaboration with Primary Care Provider, Dr. Lilyan Punt with [facility] [phone]# [phone]), Via Secure Chat Message in Epic, to Report "Feeling the Worst You Ever Have", Per Your Request. CSW Collaboration with Primary Care Provider, Dr. Lilyan Punt with [facility] [phone]# [phone]), Via Secure Chat Message in Epic, to Schedule Follow-Up Appointment, Sooner than 03/16/2023  at 2:50 PM.   Please Contact CSW Directly (# [phone]), if You Have Questions, Need Assistance, or If Additional Social Work Needs Are Identified Between Now & Our Next Scheduled Follow-Up Outreach Call.      Our next appointment is by telephone on 10/04/2022 at 9:30 am.   Please call the care guide team at [phone] if you need to cancel or reschedule your appointment.   If you are experiencing a Mental Health or Behavioral Health Crisis or need someone to talk to, please call the Suicide and Crisis Lifeline: 988 call the [place] National Suicide Prevention Lifeline: 361-472-1411 or TTY: 2790077811 TTY 828-467-3791) to talk to a trained counselor call 1-800-273-TALK (toll free, 24 hour hotline) go to [facility] [address], [place] [phone]) call the [facility] Crisis Line: (514)429-4068 call 911  Patient verbalizes understanding of instructions and care plan provided today and agrees to view in MyChart. Active MyChart status and patient understanding of how to access instructions and care plan via MyChart confirmed with patient.     Telephone follow up appointment with care management team member scheduled for:  10/04/2022 at 9:30 am.   Danford Bad, BSW, MSW, LCSW  Licensed Clinical Social Worker  [facility]  Mailing [place]. [address] [place], [place] [address] Physical Address[address], [place], [place] [address] Toll Free Main # [phone] Fax # [phone] Cell # [phone] [email or web address]

## 2022-09-20 NOTE — Telephone Encounter (Signed)
According to social worker who is talking with the patient on the phone the following  Hi Dr. Gerda Diss, I regret to be the bearer of bad news, but [name] reported feeling worse than she's ever felt, and needs to schedule a follow-up appointment with you. We have her on the waiting list at [facility], but she refuses to consider going anywhere else. Thanks   Nurses-please reach out to the patient do a triage.  I do not have any availability for today  I am not sure she needs to be seen or not in regards to urgent, need more information

## 2022-09-21 ENCOUNTER — Other Ambulatory Visit: Payer: Self-pay | Admitting: Family Medicine

## 2022-09-21 MED ORDER — ALPRAZOLAM 0.25 MG PO TABS: ORAL_TABLET | ORAL | 0 refills | Status: DC

## 2022-09-21 MED ORDER — HYDROCODONE-ACETAMINOPHEN 5-325 MG PO TABS: ORAL_TABLET | ORAL | 0 refills | Status: DC

## 2022-09-21 NOTE — Telephone Encounter (Signed)
Nurses I did send in a short supply of both of these She can have a follow-up visit in June If she needs a refill before then she will let us know She will need to cut back on the use of these medicines because they are controlled and for her age are not the best to be on  As for the Xanax I recommend a half a tablet once a day as needed but no more than a half a tablet per day As for hydrocodone half a tablet to a maximum of a full tablet once a day as needed for pain but once again try to use infrequently

## 2022-09-22 ENCOUNTER — Ambulatory Visit: Payer: Medicare Other | Admitting: Family Medicine

## 2022-09-22 NOTE — Telephone Encounter (Signed)
Patient notified of provider recommendations and stated she got the scripts yesterday and took a half of xanax and it calmed her right doen and she stopped shaking and felt so much better. Patient stated she took another half tablet today and is feeling good.

## 2022-09-24 DIAGNOSIS — K439 Ventral hernia without obstruction or gangrene: Secondary | ICD-10-CM | POA: Diagnosis not present

## 2022-09-24 DIAGNOSIS — K56699 Other intestinal obstruction unspecified as to partial versus complete obstruction: Secondary | ICD-10-CM | POA: Diagnosis not present

## 2022-09-24 DIAGNOSIS — I480 Paroxysmal atrial fibrillation: Secondary | ICD-10-CM | POA: Diagnosis not present

## 2022-09-24 DIAGNOSIS — F419 Anxiety disorder, unspecified: Secondary | ICD-10-CM | POA: Diagnosis not present

## 2022-09-24 DIAGNOSIS — Z885 Allergy status to narcotic agent status: Secondary | ICD-10-CM | POA: Diagnosis not present

## 2022-09-24 DIAGNOSIS — K56609 Unspecified intestinal obstruction, unspecified as to partial versus complete obstruction: Secondary | ICD-10-CM | POA: Diagnosis not present

## 2022-09-24 DIAGNOSIS — R112 Nausea with vomiting, unspecified: Secondary | ICD-10-CM | POA: Diagnosis not present

## 2022-09-24 DIAGNOSIS — E785 Hyperlipidemia, unspecified: Secondary | ICD-10-CM | POA: Diagnosis not present

## 2022-09-24 DIAGNOSIS — R2689 Other abnormalities of gait and mobility: Secondary | ICD-10-CM | POA: Diagnosis not present

## 2022-09-24 DIAGNOSIS — K5730 Diverticulosis of large intestine without perforation or abscess without bleeding: Secondary | ICD-10-CM | POA: Diagnosis not present

## 2022-09-24 DIAGNOSIS — K430 Incisional hernia with obstruction, without gangrene: Secondary | ICD-10-CM | POA: Diagnosis not present

## 2022-09-24 DIAGNOSIS — J449 Chronic obstructive pulmonary disease, unspecified: Secondary | ICD-10-CM | POA: Diagnosis not present

## 2022-09-24 DIAGNOSIS — Z48815 Encounter for surgical aftercare following surgery on the digestive system: Secondary | ICD-10-CM | POA: Diagnosis not present

## 2022-09-24 DIAGNOSIS — R531 Weakness: Secondary | ICD-10-CM | POA: Diagnosis not present

## 2022-09-24 DIAGNOSIS — Z882 Allergy status to sulfonamides status: Secondary | ICD-10-CM | POA: Diagnosis not present

## 2022-09-24 DIAGNOSIS — I10 Essential (primary) hypertension: Secondary | ICD-10-CM | POA: Diagnosis not present

## 2022-09-24 DIAGNOSIS — Z880 Allergy status to penicillin: Secondary | ICD-10-CM | POA: Diagnosis not present

## 2022-09-24 DIAGNOSIS — Z20822 Contact with and (suspected) exposure to covid-19: Secondary | ICD-10-CM | POA: Diagnosis not present

## 2022-09-24 DIAGNOSIS — K3189 Other diseases of stomach and duodenum: Secondary | ICD-10-CM | POA: Diagnosis not present

## 2022-09-24 DIAGNOSIS — R1111 Vomiting without nausea: Secondary | ICD-10-CM | POA: Diagnosis not present

## 2022-09-24 DIAGNOSIS — R41841 Cognitive communication deficit: Secondary | ICD-10-CM | POA: Diagnosis not present

## 2022-09-24 DIAGNOSIS — M6281 Muscle weakness (generalized): Secondary | ICD-10-CM | POA: Diagnosis not present

## 2022-09-24 DIAGNOSIS — J9811 Atelectasis: Secondary | ICD-10-CM | POA: Diagnosis not present

## 2022-09-24 DIAGNOSIS — E119 Type 2 diabetes mellitus without complications: Secondary | ICD-10-CM | POA: Diagnosis not present

## 2022-09-24 DIAGNOSIS — R110 Nausea: Secondary | ICD-10-CM | POA: Diagnosis not present

## 2022-09-24 DIAGNOSIS — K436 Other and unspecified ventral hernia with obstruction, without gangrene: Secondary | ICD-10-CM | POA: Diagnosis not present

## 2022-09-25 DIAGNOSIS — I10 Essential (primary) hypertension: Secondary | ICD-10-CM | POA: Insufficient documentation

## 2022-09-25 DIAGNOSIS — I480 Paroxysmal atrial fibrillation: Secondary | ICD-10-CM | POA: Insufficient documentation

## 2022-09-25 DIAGNOSIS — K430 Incisional hernia with obstruction, without gangrene: Secondary | ICD-10-CM | POA: Diagnosis not present

## 2022-09-29 ENCOUNTER — Ambulatory Visit: Payer: Self-pay | Admitting: *Deleted

## 2022-09-29 ENCOUNTER — Encounter: Payer: Self-pay | Admitting: *Deleted

## 2022-09-29 ENCOUNTER — Encounter: Payer: Self-pay | Admitting: Family Medicine

## 2022-09-30 ENCOUNTER — Other Ambulatory Visit: Payer: Self-pay | Admitting: Family Medicine

## 2022-09-30 NOTE — Patient Outreach (Signed)
Care Coordination   Follow Up Visit Note   09/30/2022 - Late Entry  Name: [name] MRN: [id number] DOB: 03-25-1924  [name] is a 89 y.o. year old female who sees Luking, Jonna Coup, MD for primary care. I spoke with patient's niece, [name] by phone today.  What matters to the patients health and wellness today?  Reduce and Manage Symptoms of Anxiety and Depression.   Goals Addressed               This Visit's Progress     Reduce and Manage Symptoms of Anxiety and Depression. (pt-stated)   On track     Care Coordination Interventions:  Interventions Today    Flowsheet Row Most Recent Value  Chronic Disease   Chronic disease during today's visit Atrial Fibrillation (AFib), Chronic Kidney Disease/End Stage Renal Disease (ESRD), Other  [Neuropathy, Anxiety, Depression & Head Trauma]  General Interventions   General Interventions Discussed/Reviewed General Interventions Discussed, General Interventions Reviewed, Annual Eye Exam, Durable Medical Equipment (DME), Labs, Vaccines, Health Screening, Community Resources, Level of Care, Communication with, Doctor Visits  [Communication with Primary Care Provider]  Labs Hgb A1c every 3 months, Kidney Function  [Encouraged]  Vaccines COVID-19, Flu, Pneumonia, RSV, Shingles, Tetanus/Pertussis/Diphtheria  [Encouraged]  Doctor Visits Discussed/Reviewed Doctor Visits Discussed, Doctor Visits Reviewed, Annual Wellness Visits, PCP, Specialist  [Encouraged]  Health Screening Bone Density, Colonoscopy, Mammogram  [Encouraged]  Durable Medical Equipment (DME) BP Cuff, Walker  PCP/Specialist Visits Compliance with follow-up visit  [Encouraged]  Communication with PCP/Specialists, RN  Level of Care Adult Daycare, Assisted Living, Personal Care Services  [Encouraged]  Exercise Interventions   Exercise Discussed/Reviewed Exercise Discussed, Exercise Reviewed, Physical Activity, Weight Managment, Assistive device use and maintanence   [Encouraged]  Physical Activity Discussed/Reviewed Physical Activity Discussed, Home Exercise Program (HEP), Physical Activity Reviewed, Types of exercise  [Encouraged]  Weight Management Weight maintenance  [Encouraged]  Education Interventions   Education Provided Provided Therapist, sports, Provided Web-based Education, Provided Education  Provided Verbal Education On Nutrition, Foot Care, Eye Care, Labs, Mental Health/Coping with Illness, When to see the doctor, Applications, Exercise, Medication, Development worker, community, [employer]  [Encouraged]  Mental Health Interventions   Mental Health Discussed/Reviewed Mental Health Discussed, Anxiety, Depression, Mental Health Reviewed, Grief and Loss, Substance Abuse, Coping Strategies, Crisis, Suicide  Nutrition Interventions   Nutrition Discussed/Reviewed Nutrition Discussed, Adding fruits and vegetables, Increaing proteins, Decreasing fats, Decreasing salt, Supplmental nutrition, Decreasing sugar intake, Portion sizes, Carbohydrate meal planning, Nutrition Reviewed, Fluid intake  [Encouraged]  Pharmacy Interventions   Pharmacy Dicussed/Reviewed Pharmacy Topics Discussed, Medication Adherence, Pharmacy Topics Reviewed, Medications and their functions, Affording Medications  [Encouraged]  Safety Interventions   Safety Discussed/Reviewed Safety Discussed, Safety Reviewed, Fall Risk, Home Safety  [Encouraged]  Home Safety Assistive Devices, Need for home safety assessment, Refer for community resources  [Encouraged]  Advanced Directive Interventions   Advanced Directives Discussed/Reviewed Advanced Directives Discussed, Advanced Directives Reviewed     Active Listening & Reflection Utilized.  Verbalization of Feelings Encouraged. Emotional Support Provided. Feelings of Frustration Validated. Relief from Chronic Pain Acknowledged. Problem-Solving Strategies Employed. Solution-Focused Interventions Activated. Task-Centered Activities  Implemented. CSW Collaboration with Niece, [name] to Learn of Patient's Hospital Admission to [facility] (# [phone]), on 09/24/2022, for Bowel Obstruction & Hernia Repair. CSW Collaboration with Niece, [name] to Confirm Patient Still Resides in Intensive Care Unit at [facility] [phone]), As of 09/30/2022. CSW Collaboration with Niece, [name] to [employer]  in Patient Being Placed at [facility] (# [phone]), to Receive Short-Term Rehabilitative Services, Upon Medical Clearance & Discharge from [facility] [phone]). CSW Collaboration with Niece, [name] to Confirm Assessment Completed by Admissions Coordinator at [facility] [phone]) on 09/29/2022 & Bed Offer Received.  CSW Collaboration with Niece, [name] to Encourage Completion of Special Assistance Long-Term Care Medicaid Application & Submission to [facility] of Social Services [phone]) for Processing. CSW Collaboration with Marta Antu, Admissions Coordinator at [facility] [phone]), to Confirm Bed Offer Accepted by Patient & Niece, [name]. CSW Collaboration with Primary Care Provider, Dr. Lilyan Punt with [facility] [phone]# [phone]), Via Secure Chat Message in Epic, to Report Findings of Conversation with Niece, [name] & Inform of Patient's Hospital Admission to [facility] (# [phone]), on 09/24/2022. Please Contact CSW Directly (# [phone]), if You Have Questions, Need Assistance, or If Additional Social Work Needs Are Identified Between Now & Our Next Scheduled Follow-Up Outreach Call.      SDOH assessments and interventions completed:  Yes.  Care Coordination Interventions:  Yes, provided.   Follow up plan: Follow up call scheduled for 10/04/2022 at 9:30  am.  Encounter Outcome:  Pt. Visit Completed.   Danford Bad, BSW, MSW, LCSW  Licensed Restaurant manager, [facility]  Mailing [place], [place], [place] [address] Physical Address[address], [place], [place] [address] Toll Free Main # [phone] Fax # [phone] Cell # [phone] [email or web address]

## 2022-09-30 NOTE — Patient Instructions (Signed)
Visit Information  Thank you for taking time to visit with me today. Please don't hesitate to contact me if I can be of assistance to you.   Following are the goals we discussed today:   Goals Addressed               This Visit's Progress     Reduce and Manage Symptoms of Anxiety and Depression. (pt-stated)   On track     Care Coordination Interventions:  Interventions Today    Flowsheet Row Most Recent Value  Chronic Disease   Chronic disease during today's visit Atrial Fibrillation (AFib), Chronic Kidney Disease/End Stage Renal Disease (ESRD), Other  [Neuropathy, Anxiety, Depression & Head Trauma]  General Interventions   General Interventions Discussed/Reviewed General Interventions Discussed, General Interventions Reviewed, Annual Eye Exam, Durable Medical Equipment (DME), Labs, Vaccines, Health Screening, Community Resources, Level of Care, Communication with, Doctor Visits  [Communication with Primary Care Provider]  Labs Hgb A1c every 3 months, Kidney Function  [Encouraged]  Vaccines COVID-19, Flu, Pneumonia, RSV, Shingles, Tetanus/Pertussis/Diphtheria  [Encouraged]  Doctor Visits Discussed/Reviewed Doctor Visits Discussed, Doctor Visits Reviewed, Annual Wellness Visits, PCP, Specialist  [Encouraged]  Health Screening Bone Density, Colonoscopy, Mammogram  [Encouraged]  Durable Medical Equipment (DME) BP Cuff, Walker  PCP/Specialist Visits Compliance with follow-up visit  [Encouraged]  Communication with PCP/Specialists, RN  Level of Care Adult Daycare, Assisted Living, Personal Care Services  [Encouraged]  Exercise Interventions   Exercise Discussed/Reviewed Exercise Discussed, Exercise Reviewed, Physical Activity, Weight Managment, Assistive device use and maintanence  [Encouraged]  Physical Activity Discussed/Reviewed Physical Activity Discussed, Home Exercise Program (HEP), Physical Activity Reviewed, Types of exercise  [Encouraged]  Weight Management Weight maintenance   [Encouraged]  Education Interventions   Education Provided Provided Therapist, sports, Provided Web-based Education, Provided Education  Provided Verbal Education On Nutrition, Foot Care, Eye Care, Labs, Mental Health/Coping with Illness, When to see the doctor, Applications, Exercise, Medication, Development worker, community, [employer]  [Encouraged]  Mental Health Interventions   Mental Health Discussed/Reviewed Mental Health Discussed, Anxiety, Depression, Mental Health Reviewed, Grief and Loss, Substance Abuse, Coping Strategies, Crisis, Suicide  Nutrition Interventions   Nutrition Discussed/Reviewed Nutrition Discussed, Adding fruits and vegetables, Increaing proteins, Decreasing fats, Decreasing salt, Supplmental nutrition, Decreasing sugar intake, Portion sizes, Carbohydrate meal planning, Nutrition Reviewed, Fluid intake  [Encouraged]  Pharmacy Interventions   Pharmacy Dicussed/Reviewed Pharmacy Topics Discussed, Medication Adherence, Pharmacy Topics Reviewed, Medications and their functions, Affording Medications  [Encouraged]  Safety Interventions   Safety Discussed/Reviewed Safety Discussed, Safety Reviewed, Fall Risk, Home Safety  [Encouraged]  Home Safety Assistive Devices, Need for home safety assessment, Refer for community resources  [Encouraged]  Advanced Directive Interventions   Advanced Directives Discussed/Reviewed Advanced Directives Discussed, Advanced Directives Reviewed     Active Listening & Reflection Utilized.  Verbalization of Feelings Encouraged. Emotional Support Provided. Feelings of Frustration Validated. Relief from Chronic Pain Acknowledged. Problem-Solving Strategies Employed. Solution-Focused Interventions Activated. Task-Centered Activities Implemented. CSW Collaboration with Niece, Elmer Bales to Learn of Patient's Hospital Admission to [facility] (# [phone]), on 09/24/2022, for Bowel Obstruction & Hernia Repair. CSW  Collaboration with Niece, Elmer Bales to Confirm Patient Still Resides in Intensive Care Unit at [facility] [phone]), As of 09/30/2022. CSW Collaboration with Niece, Elmer Bales to Confirm Interest in Patient Being Placed at [facility] [phone]), to Receive Short-Term Rehabilitative Services, Upon Medical Clearance & Discharge from [facility] [phone]). CSW Collaboration with Niece, Elmer Bales to Confirm Assessment Completed  by Admissions Coordinator at [facility] (# [phone]) on 09/29/2022 & Bed Offer Received.  CSW Collaboration with Niece, Elmer Bales to Encourage Completion of Special Assistance Long-Term Care Medicaid Application & Submission to [facility] of Social Services [phone]) for Processing. CSW Collaboration with Marta Antu, Admissions Coordinator at [facility] [phone]), to Confirm Bed Offer Accepted by Patient & Niece, Elmer Bales. CSW Collaboration with Primary Care Provider, Dr. Lilyan Punt with [facility] [phone]# [phone]), Via Secure Chat Message in Epic, to Report Findings of Conversation with Niece, Elmer Bales & Inform of Patient's Hospital Admission to [facility] (# [phone]), on 09/24/2022. Please Contact CSW Directly (# [phone]), if You Have Questions, Need Assistance, or If Additional Social Work Needs Are Identified Between Now & Our Next Scheduled Follow-Up Outreach Call.      Our next appointment is by telephone on 10/04/2022 at 9:30 am.  Please call the care guide team at [phone] if you need to cancel or reschedule your appointment.   If you are experiencing a Mental Health or Behavioral Health Crisis or need someone to talk to, please call the Suicide and Crisis Lifeline: 988 call the [place] National Suicide Prevention Lifeline: (970)435-3239 or  TTY: (262) 824-9710 TTY 781 660 6147) to talk to a trained counselor call 1-800-273-TALK (toll free, 24 hour hotline) go to [facility] [address], [place] [phone]) call the [facility] Crisis Line: [phone] call 911  Patient verbalizes understanding of instructions and care plan provided today and agrees to view in MyChart. Active MyChart status and patient understanding of how to access instructions and care plan via MyChart confirmed with patient.     Telephone follow up appointment with care management team member scheduled for:  10/04/2022 at 9:30 am.  Danford Bad, BSW, MSW, LCSW  Licensed Clinical Social Worker  [facility]  Mailing [place]. [address], [place], [place] [address] Physical Address[address], [place], [place] [address] Toll Free Main # [phone] Fax # [phone] Cell # [phone] [email or web address]

## 2022-10-04 ENCOUNTER — Encounter: Payer: Self-pay | Admitting: *Deleted

## 2022-10-04 ENCOUNTER — Ambulatory Visit: Payer: Self-pay | Admitting: *Deleted

## 2022-10-04 DIAGNOSIS — K439 Ventral hernia without obstruction or gangrene: Secondary | ICD-10-CM | POA: Diagnosis not present

## 2022-10-04 DIAGNOSIS — R531 Weakness: Secondary | ICD-10-CM | POA: Diagnosis not present

## 2022-10-04 DIAGNOSIS — R41841 Cognitive communication deficit: Secondary | ICD-10-CM | POA: Diagnosis not present

## 2022-10-04 DIAGNOSIS — T8142XA Infection following a procedure, deep incisional surgical site, initial encounter: Secondary | ICD-10-CM | POA: Diagnosis not present

## 2022-10-04 DIAGNOSIS — F413 Other mixed anxiety disorders: Secondary | ICD-10-CM | POA: Diagnosis not present

## 2022-10-04 DIAGNOSIS — F419 Anxiety disorder, unspecified: Secondary | ICD-10-CM | POA: Diagnosis not present

## 2022-10-04 DIAGNOSIS — Y849 Medical procedure, unspecified as the cause of abnormal reaction of the patient, or of later complication, without mention of misadventure at the time of the procedure: Secondary | ICD-10-CM | POA: Diagnosis not present

## 2022-10-04 DIAGNOSIS — R2689 Other abnormalities of gait and mobility: Secondary | ICD-10-CM | POA: Diagnosis not present

## 2022-10-04 DIAGNOSIS — I480 Paroxysmal atrial fibrillation: Secondary | ICD-10-CM | POA: Diagnosis not present

## 2022-10-04 DIAGNOSIS — E119 Type 2 diabetes mellitus without complications: Secondary | ICD-10-CM | POA: Diagnosis not present

## 2022-10-04 DIAGNOSIS — F4321 Adjustment disorder with depressed mood: Secondary | ICD-10-CM | POA: Diagnosis not present

## 2022-10-04 DIAGNOSIS — J449 Chronic obstructive pulmonary disease, unspecified: Secondary | ICD-10-CM | POA: Diagnosis not present

## 2022-10-04 DIAGNOSIS — K436 Other and unspecified ventral hernia with obstruction, without gangrene: Secondary | ICD-10-CM | POA: Diagnosis not present

## 2022-10-04 DIAGNOSIS — I10 Essential (primary) hypertension: Secondary | ICD-10-CM | POA: Diagnosis not present

## 2022-10-04 DIAGNOSIS — Z48815 Encounter for surgical aftercare following surgery on the digestive system: Secondary | ICD-10-CM | POA: Diagnosis not present

## 2022-10-04 DIAGNOSIS — I482 Chronic atrial fibrillation, unspecified: Secondary | ICD-10-CM | POA: Diagnosis not present

## 2022-10-04 DIAGNOSIS — K56699 Other intestinal obstruction unspecified as to partial versus complete obstruction: Secondary | ICD-10-CM | POA: Diagnosis not present

## 2022-10-04 DIAGNOSIS — T8189XA Other complications of procedures, not elsewhere classified, initial encounter: Secondary | ICD-10-CM | POA: Diagnosis not present

## 2022-10-04 DIAGNOSIS — N281 Cyst of kidney, acquired: Secondary | ICD-10-CM | POA: Diagnosis not present

## 2022-10-04 DIAGNOSIS — M6281 Muscle weakness (generalized): Secondary | ICD-10-CM | POA: Diagnosis not present

## 2022-10-04 NOTE — Patient Instructions (Signed)
Visit Information  Thank you for taking time to visit with me today. Please don't hesitate to contact me if I can be of assistance to you.   Following are the goals we discussed today:   Goals Addressed               This Visit's Progress     Reduce and Manage Symptoms of Anxiety and Depression. (pt-stated)   On track     Care Coordination Interventions:  Interventions Today    Flowsheet Row Most Recent Value  Chronic Disease   Chronic disease during today's visit Atrial Fibrillation (AFib), Chronic Kidney Disease/End Stage Renal Disease (ESRD), Other  [Neuropathy, Anxiety, Depression & Head Trauma]  General Interventions   General Interventions Discussed/Reviewed General Interventions Discussed, General Interventions Reviewed, Annual Eye Exam, Durable Medical Equipment (DME), Labs, Vaccines, Health Screening, Community Resources, Level of Care, Communication with, Doctor Visits  [Communication with Primary Care Provider]  Labs Hgb A1c every 3 months, Kidney Function  [Encouraged]  Vaccines COVID-19, Flu, Pneumonia, RSV, Shingles, Tetanus/Pertussis/Diphtheria  [Encouraged]  Doctor Visits Discussed/Reviewed Doctor Visits Discussed, Doctor Visits Reviewed, Annual Wellness Visits, PCP, Specialist  [Encouraged]  Health Screening Bone Density, Colonoscopy, Mammogram  [Encouraged]  Durable Medical Equipment (DME) BP Cuff, Walker  PCP/Specialist Visits Compliance with follow-up visit  [Encouraged]  Communication with PCP/Specialists, RN  Level of Care Adult Daycare, Assisted Living, Personal Care Services  [Encouraged]  Exercise Interventions   Exercise Discussed/Reviewed Exercise Discussed, Exercise Reviewed, Physical Activity, Weight Managment, Assistive device use and maintanence  [Encouraged]  Physical Activity Discussed/Reviewed Physical Activity Discussed, Home Exercise Program (HEP), Physical Activity Reviewed, Types of exercise  [Encouraged]  Weight Management Weight maintenance   [Encouraged]  Education Interventions   Education Provided Provided Therapist, sports, Provided Web-based Education, Provided Education  Provided Verbal Education On Nutrition, Foot Care, Eye Care, Labs, Mental Health/Coping with Illness, When to see the doctor, Applications, Exercise, Medication, Development worker, community, [employer]  [Encouraged]  Mental Health Interventions   Mental Health Discussed/Reviewed Mental Health Discussed, Anxiety, Depression, Mental Health Reviewed, Grief and Loss, Substance Abuse, Coping Strategies, Crisis, Suicide  Nutrition Interventions   Nutrition Discussed/Reviewed Nutrition Discussed, Adding fruits and vegetables, Increaing proteins, Decreasing fats, Decreasing salt, Supplmental nutrition, Decreasing sugar intake, Portion sizes, Carbohydrate meal planning, Nutrition Reviewed, Fluid intake  [Encouraged]  Pharmacy Interventions   Pharmacy Dicussed/Reviewed Pharmacy Topics Discussed, Medication Adherence, Pharmacy Topics Reviewed, Medications and their functions, Affording Medications  [Encouraged]  Safety Interventions   Safety Discussed/Reviewed Safety Discussed, Safety Reviewed, Fall Risk, Home Safety  [Encouraged]  Home Safety Assistive Devices, Need for home safety assessment, Refer for community resources  [Encouraged]  Advanced Directive Interventions   Advanced Directives Discussed/Reviewed Advanced Directives Discussed, Advanced Directives Reviewed     Active Listening & Reflection Utilized.  Verbalization of Feelings Encouraged. Emotional Support Provided. Feelings of Frustration Validated. Relief from Chronic Pain Acknowledged. Problem-Solving Strategies Employed. Solution-Focused Interventions Activated. Task-Centered Activities Implemented. CSW Collaboration with Kristine Royal, Admissions Coordinator with [facility] [phone]), to Confirm No Female Beds Available & Extensive Waiting List. CSW Collaboration with Fransisca Kaufmann, Sales & Marketing Director with [facility] [phone] or # [phone]), to Confirm No Female Beds Available & Extensive Waiting List. CSW Collaboration with Marta Antu, Transitions of Care Social Worker with [facility] [phone]), to Confirm Discharge to [facility] [phone]), on 10/04/2022. CSW Collaboration with Niece, Elmer Bales to Confirm Knowledge of Patient Being Discharged  from [facility] [phone]) & Transfer to [facility] [phone]), to Receive Short-Term Rehabilitative Services, on 10/04/2022. CSW Collaboration with Niece, Elmer Bales to Encourage Completion of Special Assistance Long-Term Care Medicaid Application & Submission to [facility] of Social Services [phone]), for Processing. CSW Collaboration with Primary Care Provider, Dr. Lilyan Punt with [facility] [phone]# [phone]), Via Secure Chat Message in Epic, to Report Patient's Discharge from [facility] [phone]) & Transfer to [facility] [phone]), on 10/04/2022. CSW Collaboration with Niece, Elmer Bales to [employer] with CSW [phone]# [phone]), if She Has Questions, Needs Assistance, or If Additional Social Work Needs Are Identified Between Now & Our Next Scheduled Follow-Up Outreach Call.      Our next appointment is by telephone on 10/10/2022 at 3:30 pm.  Please call the care guide team at [phone] if you need to cancel or reschedule your appointment.   If you are experiencing a Mental Health or Behavioral Health Crisis or need someone to talk to, please call the Suicide and Crisis Lifeline: 988 call the [place] National Suicide Prevention Lifeline: 514-570-5720 or TTY: (701)606-5086 TTY  443 507 8842) to talk to a trained counselor call 1-800-273-TALK (toll free, 24 hour hotline) go to [facility] [address], [place] [phone]) call the [facility] Crisis Line: [phone] call 911  Patient verbalizes understanding of instructions and care plan provided today and agrees to view in MyChart. Active MyChart status and patient understanding of how to access instructions and care plan via MyChart confirmed with patient.     Telephone follow up appointment with care management team member scheduled for:  10/10/2022 at 3:30 pm.  Danford Bad, BSW, MSW, LCSW  Licensed Clinical Social Worker  [facility]  Mailing [place]. [address], [place], [place] [address] Physical Address-[address], [place], [place] [address] Toll Free Main # [phone] Fax # [phone] Cell # [phone] [email or web address]

## 2022-10-04 NOTE — Patient Outreach (Signed)
Care Coordination   Follow Up Visit Note   10/04/2022  Name: [name] MRN: [id number] DOB: 06-10-1923  [name] is a 89 y.o. year old female who sees Luking, Jonna Coup, MD for primary care. I spoke with patient's granddaughter, Elmer Bales by phone today.  What matters to the patients health and wellness today?  Reduce and Manage Symptoms of Anxiety and Depression.   Goals Addressed               This Visit's Progress     Reduce and Manage Symptoms of Anxiety and Depression. (pt-stated)   On track     Care Coordination Interventions:  Interventions Today    Flowsheet Row Most Recent Value  Chronic Disease   Chronic disease during today's visit Atrial Fibrillation (AFib), Chronic Kidney Disease/End Stage Renal Disease (ESRD), Other  [Neuropathy, Anxiety, Depression & Head Trauma]  General Interventions   General Interventions Discussed/Reviewed General Interventions Discussed, General Interventions Reviewed, Annual Eye Exam, Durable Medical Equipment (DME), Labs, Vaccines, Health Screening, Community Resources, Level of Care, Communication with, Doctor Visits  [Communication with Primary Care Provider]  Labs Hgb A1c every 3 months, Kidney Function  [Encouraged]  Vaccines COVID-19, Flu, Pneumonia, RSV, Shingles, Tetanus/Pertussis/Diphtheria  [Encouraged]  Doctor Visits Discussed/Reviewed Doctor Visits Discussed, Doctor Visits Reviewed, Annual Wellness Visits, PCP, Specialist  [Encouraged]  Health Screening Bone Density, Colonoscopy, Mammogram  [Encouraged]  Durable Medical Equipment (DME) BP Cuff, Walker  PCP/Specialist Visits Compliance with follow-up visit  [Encouraged]  Communication with PCP/Specialists, RN  Level of Care Adult Daycare, Assisted Living, Personal Care Services  [Encouraged]  Exercise Interventions   Exercise Discussed/Reviewed Exercise Discussed, Exercise Reviewed, Physical Activity, Weight Managment, Assistive device use and maintanence   [Encouraged]  Physical Activity Discussed/Reviewed Physical Activity Discussed, Home Exercise Program (HEP), Physical Activity Reviewed, Types of exercise  [Encouraged]  Weight Management Weight maintenance  [Encouraged]  Education Interventions   Education Provided Provided Therapist, sports, Provided Web-based Education, Provided Education  Provided Verbal Education On Nutrition, Foot Care, Eye Care, Labs, Mental Health/Coping with Illness, When to see the doctor, Applications, Exercise, Medication, Development worker, community, [employer]  [Encouraged]  Mental Health Interventions   Mental Health Discussed/Reviewed Mental Health Discussed, Anxiety, Depression, Mental Health Reviewed, Grief and Loss, Substance Abuse, Coping Strategies, Crisis, Suicide  Nutrition Interventions   Nutrition Discussed/Reviewed Nutrition Discussed, Adding fruits and vegetables, Increaing proteins, Decreasing fats, Decreasing salt, Supplmental nutrition, Decreasing sugar intake, Portion sizes, Carbohydrate meal planning, Nutrition Reviewed, Fluid intake  [Encouraged]  Pharmacy Interventions   Pharmacy Dicussed/Reviewed Pharmacy Topics Discussed, Medication Adherence, Pharmacy Topics Reviewed, Medications and their functions, Affording Medications  [Encouraged]  Safety Interventions   Safety Discussed/Reviewed Safety Discussed, Safety Reviewed, Fall Risk, Home Safety  [Encouraged]  Home Safety Assistive Devices, Need for home safety assessment, Refer for community resources  [Encouraged]  Advanced Directive Interventions   Advanced Directives Discussed/Reviewed Advanced Directives Discussed, Advanced Directives Reviewed     Active Listening & Reflection Utilized.  Verbalization of Feelings Encouraged. Emotional Support Provided. Feelings of Frustration Validated. Relief from Chronic Pain Acknowledged. Problem-Solving Strategies Employed. Solution-Focused Interventions Activated. Task-Centered Activities  Implemented. CSW Collaboration with Kristine Royal, Admissions Coordinator with [facility] [phone]), to Confirm No Female Beds Available & Extensive Waiting List. CSW Collaboration with Fransisca Kaufmann, Sales & Marketing Director with [facility] [phone] or # [phone]), to Confirm No Female Beds Available & Extensive Waiting List. CSW Collaboration with Marta Antu, Transitions of Care Social Worker with [facility] [phone]), to Confirm Discharge to [facility] [phone]), on 10/04/2022. CSW Collaboration with Niece, Elmer Bales to Confirm Knowledge of Patient Being Discharged from [facility] [phone]) & Transfer to [facility] [phone]), to Receive Short-Term Rehabilitative Services, on 10/04/2022. CSW Collaboration with Niece, Elmer Bales to Encourage Completion of Special Assistance Long-Term Care Medicaid Application & Submission to [facility] of Social Services [phone]), for Processing. CSW Collaboration with Primary Care Provider, Dr. Lilyan Punt with [facility] [phone]# [phone]), Via Secure Chat Message in Epic, to Report Patient's Discharge from [facility] [phone]) & Transfer to [facility] [phone]), on 10/04/2022. CSW Collaboration with Niece, Elmer Bales to [employer] with CSW [phone]# [phone]), if She Has Questions, Needs Assistance, or If Additional Social Work Needs Are Identified Between Now & Our Next Scheduled Follow-Up Outreach Call.      SDOH assessments and interventions completed:  Yes.  Care Coordination Interventions:  Yes, provided.   Follow up plan: Follow up call scheduled for 10/10/2022 at 3:30 pm.  Encounter Outcome:   Pt. Visit Completed.   Danford Bad, BSW, MSW, LCSW  Licensed Restaurant manager, [employer] [facility]  Mailing [place] [address] [place], [place] [address] Physical Address-[address], [place], [place] [address] Toll Free Main # [phone] Fax # [phone] Cell # [phone] [email or web address]

## 2022-10-10 ENCOUNTER — Ambulatory Visit: Payer: Self-pay | Admitting: *Deleted

## 2022-10-10 ENCOUNTER — Encounter: Payer: Self-pay | Admitting: *Deleted

## 2022-10-10 NOTE — Patient Instructions (Signed)
Visit Information  Thank you for taking time to visit with me today. Please don't hesitate to contact me if I can be of assistance to you.   Following are the goals we discussed today:   Goals Addressed               This Visit's Progress     Reduce and Manage Symptoms of Anxiety and Depression. (pt-stated)   On track     Care Coordination Interventions:  Interventions Today    Flowsheet Row Most Recent Value  Chronic Disease   Chronic disease during today's visit Atrial Fibrillation (AFib), Chronic Kidney Disease/End Stage Renal Disease (ESRD), Other  [Neuropathy, Anxiety, Depression & Head Trauma]  General Interventions   General Interventions Discussed/Reviewed General Interventions Discussed, General Interventions Reviewed, Annual Eye Exam, Durable Medical Equipment (DME), Labs, Vaccines, Health Screening, Community Resources, Level of Care, Communication with, Doctor Visits  [Communication with Primary Care Provider]  Labs Hgb A1c every 3 months, Kidney Function  [Encouraged]  Vaccines COVID-19, Flu, Pneumonia, RSV, Shingles, Tetanus/Pertussis/Diphtheria  [Encouraged]  Doctor Visits Discussed/Reviewed Doctor Visits Discussed, Doctor Visits Reviewed, Annual Wellness Visits, PCP, Specialist  [Encouraged]  Health Screening Bone Density, Colonoscopy, Mammogram  [Encouraged]  Durable Medical Equipment (DME) BP Cuff, Walker  PCP/Specialist Visits Compliance with follow-up visit  [Encouraged]  Communication with PCP/Specialists, RN  Level of Care Adult Daycare, Assisted Living, Personal Care Services  [Encouraged]  Exercise Interventions   Exercise Discussed/Reviewed Exercise Discussed, Exercise Reviewed, Physical Activity, Weight Managment, Assistive device use and maintanence  [Encouraged]  Physical Activity Discussed/Reviewed Physical Activity Discussed, Home Exercise Program (HEP), Physical Activity Reviewed, Types of exercise  [Encouraged]  Weight Management Weight maintenance   [Encouraged]  Education Interventions   Education Provided Provided Therapist, sports, Provided Web-based Education, Provided Education  Provided Verbal Education On Nutrition, Foot Care, Eye Care, Labs, Mental Health/Coping with Illness, When to see the doctor, Applications, Exercise, Medication, Development worker, community, [employer]  [Encouraged]  Mental Health Interventions   Mental Health Discussed/Reviewed Mental Health Discussed, Anxiety, Depression, Mental Health Reviewed, Grief and Loss, Substance Abuse, Coping Strategies, Crisis, Suicide  Nutrition Interventions   Nutrition Discussed/Reviewed Nutrition Discussed, Adding fruits and vegetables, Increaing proteins, Decreasing fats, Decreasing salt, Supplmental nutrition, Decreasing sugar intake, Portion sizes, Carbohydrate meal planning, Nutrition Reviewed, Fluid intake  [Encouraged]  Pharmacy Interventions   Pharmacy Dicussed/Reviewed Pharmacy Topics Discussed, Medication Adherence, Pharmacy Topics Reviewed, Medications and their functions, Affording Medications  [Encouraged]  Safety Interventions   Safety Discussed/Reviewed Safety Discussed, Safety Reviewed, Fall Risk, Home Safety  [Encouraged]  Home Safety Assistive Devices, Need for home safety assessment, Refer for community resources  [Encouraged]  Advanced Directive Interventions   Advanced Directives Discussed/Reviewed Advanced Directives Discussed, Advanced Directives Reviewed     Active Listening & Reflection Utilized.  Verbalization of Feelings Encouraged. Emotional Support Provided. Feelings of Gratitude Validated. Relief from Chronic Pain Acknowledged. Problem-Solving Strategies Employed. Solution-Focused Interventions Activated. Task-Centered Activities Implemented. Cognitive Behavioral Therapy Performed. Acceptance & Commitment Therapy Initiated. Client-Centered Therapy Indicated. Confirmed Current Place of Residence at [facility] [phone]), to Receive Short-Term Rehabilitative Services. Confirmed Interest in Receiving Long-Term Care Placement in An Assisted Living Facility of Interest, Upon Completion of Short-Term Rehabilitative Services & Discharge from [facility] [phone]). Confirmed Interest in Having CSW Work Directly with Granddaughter, Elmer Bales to [employer]. Encouraged Careers information officer with CSW (# [phone]), if You Have Questions, Need Assistance, or If Additional  Social Work Needs Are Identified Between Now & Our Next Scheduled Follow-Up Outreach Call.      Our next appointment is by telephone on 10/24/2022 at 2:15 pm.   Please call the care guide team at [phone] if you need to cancel or reschedule your appointment.   If you are experiencing a Mental Health or Behavioral Health Crisis or need someone to talk to, please call the Suicide and Crisis Lifeline: 988 call the [place] National Suicide Prevention Lifeline: (412)456-9324 or TTY: 845-050-7970 TTY 7010608222) to talk to a trained counselor call 1-800-273-TALK (toll free, 24 hour hotline) go to [facility] [address], [place] [phone]) call the [facility] Crisis Line: [phone] call 911  Patient verbalizes understanding of instructions and care plan provided today and agrees to view in MyChart. Active MyChart status and patient understanding of how to access instructions and care plan via MyChart confirmed with patient.     Telephone follow up appointment with care management team member scheduled for:  10/24/2022 at 2:15 pm.   Danford Bad, BSW, MSW, LCSW  Licensed Clinical Social Worker  [facility]  Mailing [place]. [address] [place], [place] [address] Physical Address-[address], [place], [place] [address] Toll Free Main #  [phone] Fax # [phone] Cell # [phone] [email or web address]

## 2022-10-10 NOTE — Patient Outreach (Signed)
Care Coordination   Follow Up Visit Note   10/10/2022  Name: [name] MRN: [id number] DOB: 16-Aug-1923  [name] is a 89 y.o. year old female who sees Luking, Jonna Coup, MD for primary care. I spoke with [name] by phone today.  What matters to the patients health and wellness today?   Reduce and Manage Symptoms of Anxiety and Depression.   Goals Addressed               This Visit's Progress     Reduce and Manage Symptoms of Anxiety and Depression. (pt-stated)   On track     Care Coordination Interventions:  Interventions Today    Flowsheet Row Most Recent Value  Chronic Disease   Chronic disease during today's visit Atrial Fibrillation (AFib), Chronic Kidney Disease/End Stage Renal Disease (ESRD), Other  [Neuropathy, Anxiety, Depression & Head Trauma]  General Interventions   General Interventions Discussed/Reviewed General Interventions Discussed, General Interventions Reviewed, Annual Eye Exam, Durable Medical Equipment (DME), Labs, Vaccines, Health Screening, Community Resources, Level of Care, Communication with, Doctor Visits  [Communication with Primary Care Provider]  Labs Hgb A1c every 3 months, Kidney Function  [Encouraged]  Vaccines COVID-19, Flu, Pneumonia, RSV, Shingles, Tetanus/Pertussis/Diphtheria  [Encouraged]  Doctor Visits Discussed/Reviewed Doctor Visits Discussed, Doctor Visits Reviewed, Annual Wellness Visits, PCP, Specialist  [Encouraged]  Health Screening Bone Density, Colonoscopy, Mammogram  [Encouraged]  Durable Medical Equipment (DME) BP Cuff, Walker  PCP/Specialist Visits Compliance with follow-up visit  [Encouraged]  Communication with PCP/Specialists, RN  Level of Care Adult Daycare, Assisted Living, Personal Care Services  [Encouraged]  Exercise Interventions   Exercise Discussed/Reviewed Exercise Discussed, Exercise Reviewed, Physical Activity, Weight Managment, Assistive device use and maintanence  [Encouraged]  Physical  Activity Discussed/Reviewed Physical Activity Discussed, Home Exercise Program (HEP), Physical Activity Reviewed, Types of exercise  [Encouraged]  Weight Management Weight maintenance  [Encouraged]  Education Interventions   Education Provided Provided Therapist, sports, Provided Web-based Education, Provided Education  Provided Verbal Education On Nutrition, Foot Care, Eye Care, Labs, Mental Health/Coping with Illness, When to see the doctor, Applications, Exercise, Medication, Development worker, community, [employer]  [Encouraged]  Mental Health Interventions   Mental Health Discussed/Reviewed Mental Health Discussed, Anxiety, Depression, Mental Health Reviewed, Grief and Loss, Substance Abuse, Coping Strategies, Crisis, Suicide  Nutrition Interventions   Nutrition Discussed/Reviewed Nutrition Discussed, Adding fruits and vegetables, Increaing proteins, Decreasing fats, Decreasing salt, Supplmental nutrition, Decreasing sugar intake, Portion sizes, Carbohydrate meal planning, Nutrition Reviewed, Fluid intake  [Encouraged]  Pharmacy Interventions   Pharmacy Dicussed/Reviewed Pharmacy Topics Discussed, Medication Adherence, Pharmacy Topics Reviewed, Medications and their functions, Affording Medications  [Encouraged]  Safety Interventions   Safety Discussed/Reviewed Safety Discussed, Safety Reviewed, Fall Risk, Home Safety  [Encouraged]  Home Safety Assistive Devices, Need for home safety assessment, Refer for community resources  [Encouraged]  Advanced Directive Interventions   Advanced Directives Discussed/Reviewed Advanced Directives Discussed, Advanced Directives Reviewed     Active Listening & Reflection Utilized.  Verbalization of Feelings Encouraged. Emotional Support Provided. Feelings of Gratitude Validated. Relief from Chronic Pain Acknowledged. Problem-Solving Strategies Employed. Solution-Focused Interventions Activated. Task-Centered Activities Implemented. Cognitive Behavioral  Therapy Performed. Acceptance & Commitment Therapy Initiated. Client-Centered Therapy Indicated. Confirmed [facility] [phone]), to Receive Short-Term Rehabilitative Services. Confirmed Interest in Receiving Long-Term Care Placement in An Assisted Living Facility of Interest, Upon Completion of Short-Term Rehabilitative Services & Discharge from [facility] [phone]).  Confirmed Interest in Having CSW Work Directly with Granddaughter, Elmer Bales to [employer]. Encouraged Careers information officer with CSW (# [phone]), if You Have Questions, Need Assistance, or If Additional Social Work Needs Are Identified Between Now & Our Next Scheduled Follow-Up Outreach Call.      SDOH assessments and interventions completed:  Yes.  Care Coordination Interventions:  Yes, provided.   Follow up plan: Follow up call scheduled for 10/24/2022 at 2:15 pm.   Encounter Outcome:  Pt. Visit Completed.   Danford Bad, BSW, MSW, LCSW  Licensed Restaurant manager, [employer] food [facility]  Mailing [place] [address], [place], [place] [address] Physical Address[address] [place], [place] [address] Toll Free Main # [phone] Fax # [phone] Cell # [phone] [email or web address]

## 2022-10-19 DIAGNOSIS — K436 Other and unspecified ventral hernia with obstruction, without gangrene: Secondary | ICD-10-CM | POA: Diagnosis not present

## 2022-10-19 DIAGNOSIS — T8142XA Infection following a procedure, deep incisional surgical site, initial encounter: Secondary | ICD-10-CM | POA: Diagnosis not present

## 2022-10-21 DIAGNOSIS — I482 Chronic atrial fibrillation, unspecified: Secondary | ICD-10-CM | POA: Diagnosis not present

## 2022-10-21 DIAGNOSIS — K436 Other and unspecified ventral hernia with obstruction, without gangrene: Secondary | ICD-10-CM | POA: Diagnosis not present

## 2022-10-21 DIAGNOSIS — I10 Essential (primary) hypertension: Secondary | ICD-10-CM | POA: Diagnosis not present

## 2022-10-21 DIAGNOSIS — T8189XA Other complications of procedures, not elsewhere classified, initial encounter: Secondary | ICD-10-CM | POA: Diagnosis not present

## 2022-10-24 ENCOUNTER — Ambulatory Visit: Payer: Self-pay | Admitting: *Deleted

## 2022-10-24 ENCOUNTER — Encounter: Payer: Self-pay | Admitting: *Deleted

## 2022-10-24 DIAGNOSIS — T8142XA Infection following a procedure, deep incisional surgical site, initial encounter: Secondary | ICD-10-CM | POA: Diagnosis not present

## 2022-10-24 DIAGNOSIS — Y849 Medical procedure, unspecified as the cause of abnormal reaction of the patient, or of later complication, without mention of misadventure at the time of the procedure: Secondary | ICD-10-CM | POA: Diagnosis not present

## 2022-10-24 DIAGNOSIS — N281 Cyst of kidney, acquired: Secondary | ICD-10-CM | POA: Diagnosis not present

## 2022-10-24 NOTE — Patient Instructions (Signed)
Visit Information  Thank you for taking time to visit with me today. Please don't hesitate to contact me if I can be of assistance to you.   Following are the goals we discussed today:   Goals Addressed               This Visit's Progress     Reduce and Manage Symptoms of Anxiety and Depression. (pt-stated)   On track     Care Coordination Interventions:  Interventions Today    Flowsheet Row Most Recent Value  Chronic Disease   Chronic disease during today's visit Atrial Fibrillation (AFib), Chronic Kidney Disease/End Stage Renal Disease (ESRD), Other  [Neuropathy, Anxiety, Depression & Head Trauma]  General Interventions   General Interventions Discussed/Reviewed General Interventions Discussed, General Interventions Reviewed, Annual Eye Exam, Durable Medical Equipment (DME), Labs, Vaccines, Health Screening, Community Resources, Level of Care, Communication with, Doctor Visits  [Communication with Primary Care Provider]  Labs Hgb A1c every 3 months, Kidney Function  [Encouraged]  Vaccines COVID-19, Flu, Pneumonia, RSV, Shingles, Tetanus/Pertussis/Diphtheria  [Encouraged]  Doctor Visits Discussed/Reviewed Doctor Visits Discussed, Doctor Visits Reviewed, Annual Wellness Visits, PCP, Specialist  [Encouraged]  Health Screening Bone Density, Colonoscopy, Mammogram  [Encouraged]  Durable Medical Equipment (DME) BP Cuff, Walker  PCP/Specialist Visits Compliance with follow-up visit  [Encouraged]  Communication with PCP/Specialists, RN  Level of Care Adult Daycare, Assisted Living, Personal Care Services  [Encouraged]  Exercise Interventions   Exercise Discussed/Reviewed Exercise Discussed, Exercise Reviewed, Physical Activity, Weight Managment, Assistive device use and maintanence  [Encouraged]  Physical Activity Discussed/Reviewed Physical Activity Discussed, Home Exercise Program (HEP), Physical Activity Reviewed, Types of exercise  [Encouraged]  Weight Management Weight maintenance   [Encouraged]  Education Interventions   Education Provided Provided Therapist, sports, Provided Web-based Education, Provided Education  Provided Verbal Education On Nutrition, Foot Care, Eye Care, Labs, Mental Health/Coping with Illness, When to see the doctor, Applications, Exercise, Medication, Development worker, community, [employer]  [Encouraged]  Mental Health Interventions   Mental Health Discussed/Reviewed Mental Health Discussed, Anxiety, Depression, Mental Health Reviewed, Grief and Loss, Substance Abuse, Coping Strategies, Crisis, Suicide  Nutrition Interventions   Nutrition Discussed/Reviewed Nutrition Discussed, Adding fruits and vegetables, Increaing proteins, Decreasing fats, Decreasing salt, Supplmental nutrition, Decreasing sugar intake, Portion sizes, Carbohydrate meal planning, Nutrition Reviewed, Fluid intake  [Encouraged]  Pharmacy Interventions   Pharmacy Dicussed/Reviewed Pharmacy Topics Discussed, Medication Adherence, Pharmacy Topics Reviewed, Medications and their functions, Affording Medications  [Encouraged]  Safety Interventions   Safety Discussed/Reviewed Safety Discussed, Safety Reviewed, Fall Risk, Home Safety  [Encouraged]  Home Safety Assistive Devices, Need for home safety assessment, Refer for community resources  [Encouraged]  Advanced Directive Interventions   Advanced Directives Discussed/Reviewed Advanced Directives Discussed, Advanced Directives Reviewed     Active Listening & Reflection Utilized.  Verbalization of Feelings Encouraged. Emotional Support Provided. Feelings of Gratitude Validated. Relief from Chronic Pain Acknowledged. Problem-Solving Strategies Employed. Solution-Focused Interventions Activated. Task-Centered Activities Implemented. Cognitive Behavioral Therapy Performed. Acceptance & Commitment Therapy Initiated. Client-Centered Therapy Indicated. Confirmed Continued Place of Residence at [facility] [phone]), to Receive Short-Term Rehabilitative Services. Confirmed Interest in Receiving Long-Term Care Placement at [facility] [phone]), Upon Completion of Short-Term Rehabilitative Services & Discharge from [facility] [phone]). CSW Collaboration with Admissions Director at [employer] [facility] [place] [facility] [phone]), to Confirm Female Bed Availability. CSW Collaboration with Insurance claims handler at [facility] [phone]), to Request Completion of Updated FL-2 Form & Submission to [facility] [phone]), to Pursue Bed Offer. Encouraged Careers information officer with CSW (# [phone]), if You Have Questions, Need Assistance, or If Additional Social Work Needs Are Identified Between Now & Our Next Scheduled Follow-Up Outreach Call.      Our next appointment is by telephone on 11/07/2022 at 2:15 pm.  Please call the care guide team at [phone] if you need to cancel or reschedule your appointment.   If you are experiencing a Mental Health or Behavioral Health Crisis or need someone to talk to, please call the Suicide and Crisis Lifeline: 988 call the [place] National Suicide Prevention Lifeline: 657-062-1554 or TTY: 816-570-5633 TTY (708)114-8684) to talk to a trained counselor call 1-800-273-TALK (toll free, 24 hour hotline) go to [facility] [address], [place] [phone]) call the [facility] Crisis Line: [phone] call 911  Patient verbalizes understanding of instructions and care plan provided today and agrees to view in MyChart. Active MyChart status and patient understanding of how to access instructions and care plan via MyChart confirmed with patient.     Telephone follow up appointment with care management team member scheduled for:   11/07/2022 at 2:15 pm.  Danford Bad, BSW, MSW, LCSW  Licensed Clinical Social Worker  [employer] [facility]  Mailing [place]. [address], [place], [place] [address] Physical Address[address], [place], [place] [address] Toll Free Main # [phone] Fax # [phone] Cell # [phone] [email or web address]

## 2022-10-24 NOTE — Patient Outreach (Signed)
Care Coordination   Follow Up Visit Note   10/24/2022  Name: SOKHA CINCO MRN: [id number] DOB: [birth year]  [name] is a 89 y.o. year old female who sees Luking, Jonna Coup, MD for primary care. I spoke with patient's granddaughter, Elmer Bales by phone today.  What matters to the patients health and wellness today?  Reduce and Manage Symptoms of Anxiety and Depression.   Goals Addressed               This Visit's Progress     Reduce and Manage Symptoms of Anxiety and Depression. (pt-stated)   On track     Care Coordination Interventions:  Interventions Today    Flowsheet Row Most Recent Value  Chronic Disease   Chronic disease during today's visit Atrial Fibrillation (AFib), Chronic Kidney Disease/End Stage Renal Disease (ESRD), Other  [Neuropathy, Anxiety, Depression & Head Trauma]  General Interventions   General Interventions Discussed/Reviewed General Interventions Discussed, General Interventions Reviewed, Annual Eye Exam, Durable Medical Equipment (DME), Labs, Vaccines, Health Screening, Community Resources, Level of Care, Communication with, Doctor Visits  [Communication with Primary Care Provider]  Labs Hgb A1c every 3 months, Kidney Function  [Encouraged]  Vaccines COVID-19, Flu, Pneumonia, RSV, Shingles, Tetanus/Pertussis/Diphtheria  [Encouraged]  Doctor Visits Discussed/Reviewed Doctor Visits Discussed, Doctor Visits Reviewed, Annual Wellness Visits, PCP, Specialist  [Encouraged]  Health Screening Bone Density, Colonoscopy, Mammogram  [Encouraged]  Durable Medical Equipment (DME) BP Cuff, Walker  PCP/Specialist Visits Compliance with follow-up visit  [Encouraged]  Communication with PCP/Specialists, RN  Level of Care Adult Daycare, Assisted Living, Personal Care Services  [Encouraged]  Exercise Interventions   Exercise Discussed/Reviewed Exercise Discussed, Exercise Reviewed, Physical Activity, Weight Managment, Assistive device use and maintanence   [Encouraged]  Physical Activity Discussed/Reviewed Physical Activity Discussed, Home Exercise Program (HEP), Physical Activity Reviewed, Types of exercise  [Encouraged]  Weight Management Weight maintenance  [Encouraged]  Education Interventions   Education Provided Provided Therapist, sports, Provided Web-based Education, Provided Education  Provided Verbal Education On Nutrition, Foot Care, Eye Care, Labs, Mental Health/Coping with Illness, When to see the doctor, Applications, Exercise, Medication, Development worker, community, [employer]  [Encouraged]  Mental Health Interventions   Mental Health Discussed/Reviewed Mental Health Discussed, Anxiety, Depression, Mental Health Reviewed, Grief and Loss, Substance Abuse, Coping Strategies, Crisis, Suicide  Nutrition Interventions   Nutrition Discussed/Reviewed Nutrition Discussed, Adding fruits and vegetables, Increaing proteins, Decreasing fats, Decreasing salt, Supplmental nutrition, Decreasing sugar intake, Portion sizes, Carbohydrate meal planning, Nutrition Reviewed, Fluid intake  [Encouraged]  Pharmacy Interventions   Pharmacy Dicussed/Reviewed Pharmacy Topics Discussed, Medication Adherence, Pharmacy Topics Reviewed, Medications and their functions, Affording Medications  [Encouraged]  Safety Interventions   Safety Discussed/Reviewed Safety Discussed, Safety Reviewed, Fall Risk, Home Safety  [Encouraged]  Home Safety Assistive Devices, Need for home safety assessment, Refer for community resources  [Encouraged]  Advanced Directive Interventions   Advanced Directives Discussed/Reviewed Advanced Directives Discussed, Advanced Directives Reviewed     Active Listening & Reflection Utilized.  Verbalization of Feelings Encouraged. Emotional Support Provided. Feelings of Gratitude Validated. Relief from Chronic Pain Acknowledged. Problem-Solving Strategies Employed. Solution-Focused Interventions Activated. Task-Centered Activities  Implemented. Cognitive Behavioral Therapy Performed. Acceptance & Commitment Therapy Initiated. Client-Centered Therapy Indicated. Confirmed Continued [facility] of Res[facility] at [facility] [phone]), to Receive Short-Term Rehabilitative Services. Confirmed Interest in Receiving Long-Term Care Placement at [facility] [phone]), Upon Completion of Short-Term Rehabilitative Services & Discharge from [facility] (#  [phone]). CSW Collaboration with Admissions Director at [employer] [facility] [place] [facility] [phone]), to Confirm Female Bed Availability. CSW Collaboration with Insurance claims handler at [facility] [phone]), to Request Completion of Updated FL-2 Form & Submission to [facility] [phone]), to Pursue Bed Offer. Encouraged Careers information officer with CSW (# [phone]), if You Have Questions, Need Assistance, or If Additional Social Work Needs Are Identified Between Now & Our Next Scheduled Follow-Up Outreach Call.      SDOH assessments and interventions completed:  Yes.  Care Coordination Interventions:  Yes, provided.   Follow up plan: Follow up call scheduled for 11/07/2022 at 2:15 pm.  Encounter Outcome:  Pt. Visit Completed.   Danford Bad, BSW, MSW, LCSW  Licensed Restaurant manager, [employer] [facility]  Mailing [place] [address] [place], [place] [address] Physical Address[address], [place], [place] [address] Toll Free Main # [phone] Fax # [phone] Cell # [phone] [email or web address]

## 2022-10-27 DIAGNOSIS — F4321 Adjustment disorder with depressed mood: Secondary | ICD-10-CM | POA: Diagnosis not present

## 2022-10-27 DIAGNOSIS — F413 Other mixed anxiety disorders: Secondary | ICD-10-CM | POA: Diagnosis not present

## 2022-10-28 DIAGNOSIS — I10 Essential (primary) hypertension: Secondary | ICD-10-CM | POA: Diagnosis not present

## 2022-10-28 DIAGNOSIS — T8189XA Other complications of procedures, not elsewhere classified, initial encounter: Secondary | ICD-10-CM | POA: Diagnosis not present

## 2022-10-28 DIAGNOSIS — K436 Other and unspecified ventral hernia with obstruction, without gangrene: Secondary | ICD-10-CM | POA: Diagnosis not present

## 2022-10-28 DIAGNOSIS — I482 Chronic atrial fibrillation, unspecified: Secondary | ICD-10-CM | POA: Diagnosis not present

## 2022-11-07 ENCOUNTER — Ambulatory Visit: Payer: Self-pay | Admitting: *Deleted

## 2022-11-07 ENCOUNTER — Telehealth: Payer: Self-pay

## 2022-11-07 ENCOUNTER — Encounter: Payer: Self-pay | Admitting: *Deleted

## 2022-11-07 DIAGNOSIS — T8149XD Infection following a procedure, other surgical site, subsequent encounter: Secondary | ICD-10-CM | POA: Diagnosis not present

## 2022-11-07 DIAGNOSIS — Z48815 Encounter for surgical aftercare following surgery on the digestive system: Secondary | ICD-10-CM | POA: Diagnosis not present

## 2022-11-07 DIAGNOSIS — E1142 Type 2 diabetes mellitus with diabetic polyneuropathy: Secondary | ICD-10-CM | POA: Diagnosis not present

## 2022-11-07 DIAGNOSIS — E039 Hypothyroidism, unspecified: Secondary | ICD-10-CM | POA: Diagnosis not present

## 2022-11-07 DIAGNOSIS — J449 Chronic obstructive pulmonary disease, unspecified: Secondary | ICD-10-CM | POA: Diagnosis not present

## 2022-11-07 DIAGNOSIS — M109 Gout, unspecified: Secondary | ICD-10-CM | POA: Diagnosis not present

## 2022-11-07 DIAGNOSIS — I10 Essential (primary) hypertension: Secondary | ICD-10-CM | POA: Diagnosis not present

## 2022-11-07 DIAGNOSIS — I480 Paroxysmal atrial fibrillation: Secondary | ICD-10-CM | POA: Diagnosis not present

## 2022-11-07 NOTE — Telephone Encounter (Signed)
That would be fine may have verbal orders

## 2022-11-07 NOTE — Patient Outreach (Addendum)
Care Coordination   Follow Up Visit Note   11/07/2022  Name: [name] MRN: [id number] DOB: 10/11/1923  [name] is a 89 y.o. year old female who sees Luking, Jonna Coup, MD for primary care. I spoke with [name] by phone today.  What matters to the patients health and wellness today?  Reduce and Manage Symptoms of Anxiety and Depression.   Goals Addressed               This Visit's Progress     COMPLETED: Reduce and Manage Symptoms of Anxiety and Depression. (pt-stated)   On track     Care Coordination Interventions:  Interventions Today    Flowsheet Row Most Recent Value  Chronic Disease   Chronic disease during today's visit Atrial Fibrillation (AFib), Chronic Kidney Disease/End Stage Renal Disease (ESRD), Other  [Neuropathy, Anxiety, Depression & Head Trauma]  General Interventions   General Interventions Discussed/Reviewed General Interventions Discussed, General Interventions Reviewed, Annual Eye Exam, Durable Medical Equipment (DME), Labs, Vaccines, Health Screening, Community Resources, Level of Care, Communication with, Doctor Visits  [Communication with Primary Care Provider]  Labs Hgb A1c every 3 months, Kidney Function  [Encouraged]  Vaccines COVID-19, Flu, Pneumonia, RSV, Shingles, Tetanus/Pertussis/Diphtheria  [Encouraged]  Doctor Visits Discussed/Reviewed Doctor Visits Discussed, Doctor Visits Reviewed, Annual Wellness Visits, PCP, Specialist  [Encouraged]  Health Screening Bone Density, Colonoscopy, Mammogram  [Encouraged]  Durable Medical Equipment (DME) BP Cuff, Walker  PCP/Specialist Visits Compliance with follow-up visit  [Encouraged]  Communication with PCP/Specialists, RN  Level of Care Adult Daycare, Assisted Living, Personal Care Services  [Encouraged]  Exercise Interventions   Exercise Discussed/Reviewed Exercise Discussed, Exercise Reviewed, Physical Activity, Weight Managment, Assistive device use and maintanence  [Encouraged]   Physical Activity Discussed/Reviewed Physical Activity Discussed, Home Exercise Program (HEP), Physical Activity Reviewed, Types of exercise  [Encouraged]  Weight Management Weight maintenance  [Encouraged]  Education Interventions   Education Provided Provided Therapist, sports, Provided Web-based Education, Provided Education  Provided Verbal Education On Nutrition, Foot Care, Eye Care, Labs, Mental Health/Coping with Illness, When to see the doctor, Applications, Exercise, Medication, Development worker, community, [employer]  [Encouraged]  Mental Health Interventions   Mental Health Discussed/Reviewed Mental Health Discussed, Anxiety, Depression, Mental Health Reviewed, Grief and Loss, Substance Abuse, Coping Strategies, Crisis, Suicide  Nutrition Interventions   Nutrition Discussed/Reviewed Nutrition Discussed, Adding fruits and vegetables, Increaing proteins, Decreasing fats, Decreasing salt, Supplmental nutrition, Decreasing sugar intake, Portion sizes, Carbohydrate meal planning, Nutrition Reviewed, Fluid intake  [Encouraged]  Pharmacy Interventions   Pharmacy Dicussed/Reviewed Pharmacy Topics Discussed, Medication Adherence, Pharmacy Topics Reviewed, Medications and their functions, Affording Medications  [Encouraged]  Safety Interventions   Safety Discussed/Reviewed Safety Discussed, Safety Reviewed, Fall Risk, Home Safety  [Encouraged]  Home Safety Assistive Devices, Need for home safety assessment, Refer for community resources  [Encouraged]  Advanced Directive Interventions   Advanced Directives Discussed/Reviewed Advanced Directives Discussed, Advanced Directives Reviewed     Active Listening & Reflection Utilized.  Verbalization of Feelings Encouraged. Emotional Support Provided. Relief from Chronic Pain Acknowledged. Problem-Solving Strategies Employed. Solution-Focused Interventions Activated. Task-Centered Activities Implemented. Cognitive Behavioral Therapy  Performed. Acceptance & Commitment Therapy Initiated. Client-Centered Therapy Indicated. Confirmed New Place of Residence at [facility] [phone]), for Long-Term Care Placement. Confirmed Home Health Skilled Nursing Services in [place], through [facility] [phone]), for Wound Care & Dressing Changes to Abdominal Incision, Twice Per Week. Encouraged Careers information officer with CSW (# [phone]), if You Have Questions, Need Assistance, or  If Additional Social Work Needs Are Identified in The Near Future.      SDOH assessments and interventions completed:  Yes.  Care Coordination Interventions:  Yes, provided.   Follow up plan: No further intervention required.   Encounter Outcome:  Pt. Visit Completed.   Danford Bad, BSW, MSW, LCSW  Licensed Restaurant manager, [employer] [facility]  Mailing [place] [address], [place], [place] [address] Physical Address[address] [place], [place] [address] Toll Free Main # [phone] Fax # [phone] Cell # [phone] [email or web address]

## 2022-11-07 NOTE — Patient Instructions (Addendum)
Visit Information  Thank you for taking time to visit with me today. Please don't hesitate to contact me if I can be of assistance to you.   Following are the goals we discussed today:   Goals Addressed               This Visit's Progress     COMPLETED: Reduce and Manage Symptoms of Anxiety and Depression. (pt-stated)   On track     Care Coordination Interventions:  Interventions Today    Flowsheet Row Most Recent Value  Chronic Disease   Chronic disease during today's visit Atrial Fibrillation (AFib), Chronic Kidney Disease/End Stage Renal Disease (ESRD), Other  [Neuropathy, Anxiety, Depression & Head Trauma]  General Interventions   General Interventions Discussed/Reviewed General Interventions Discussed, General Interventions Reviewed, Annual Eye Exam, Durable Medical Equipment (DME), Labs, Vaccines, Health Screening, Community Resources, Level of Care, Communication with, Doctor Visits  [Communication with Primary Care Provider]  Labs Hgb A1c every 3 months, Kidney Function  [Encouraged]  Vaccines COVID-19, Flu, Pneumonia, RSV, Shingles, Tetanus/Pertussis/Diphtheria  [Encouraged]  Doctor Visits Discussed/Reviewed Doctor Visits Discussed, Doctor Visits Reviewed, Annual Wellness Visits, PCP, Specialist  [Encouraged]  Health Screening Bone Density, Colonoscopy, Mammogram  [Encouraged]  Durable Medical Equipment (DME) BP Cuff, Walker  PCP/Specialist Visits Compliance with follow-up visit  [Encouraged]  Communication with PCP/Specialists, RN  Level of Care Adult Daycare, Assisted Living, Personal Care Services  [Encouraged]  Exercise Interventions   Exercise Discussed/Reviewed Exercise Discussed, Exercise Reviewed, Physical Activity, Weight Managment, Assistive device use and maintanence  [Encouraged]  Physical Activity Discussed/Reviewed Physical Activity Discussed, Home Exercise Program (HEP), Physical Activity Reviewed, Types of exercise  [Encouraged]  Weight Management Weight  maintenance  [Encouraged]  Education Interventions   Education Provided Provided Therapist, sports, Provided Web-based Education, Provided Education  Provided Verbal Education On Nutrition, Foot Care, Eye Care, Labs, Mental Health/Coping with Illness, When to see the doctor, Applications, Exercise, Medication, Development worker, community, [employer]  [Encouraged]  Mental Health Interventions   Mental Health Discussed/Reviewed Mental Health Discussed, Anxiety, Depression, Mental Health Reviewed, Grief and Loss, Substance Abuse, Coping Strategies, Crisis, Suicide  Nutrition Interventions   Nutrition Discussed/Reviewed Nutrition Discussed, Adding fruits and vegetables, Increaing proteins, Decreasing fats, Decreasing salt, Supplmental nutrition, Decreasing sugar intake, Portion sizes, Carbohydrate meal planning, Nutrition Reviewed, Fluid intake  [Encouraged]  Pharmacy Interventions   Pharmacy Dicussed/Reviewed Pharmacy Topics Discussed, Medication Adherence, Pharmacy Topics Reviewed, Medications and their functions, Affording Medications  [Encouraged]  Safety Interventions   Safety Discussed/Reviewed Safety Discussed, Safety Reviewed, Fall Risk, Home Safety  [Encouraged]  Home Safety Assistive Devices, Need for home safety assessment, Refer for community resources  [Encouraged]  Advanced Directive Interventions   Advanced Directives Discussed/Reviewed Advanced Directives Discussed, Advanced Directives Reviewed     Active Listening & Reflection Utilized.  Verbalization of Feelings Encouraged. Emotional Support Provided. Relief from Chronic Pain Acknowledged. Problem-Solving Strategies Employed. Solution-Focused Interventions Activated. Task-Centered Activities Implemented. Cognitive Behavioral Therapy Performed. Acceptance & Commitment Therapy Initiated. Client-Centered Therapy Indicated. Confirmed New Place of Residence at [facility] [phone]), for Long-Term Care  Placement. Confirmed Home Health Skilled Nursing Services in [place], through [facility] [phone]), for Wound Care & Dressing Changes to Abdominal Incision, Twice Per Week. Encouraged Careers information officer with CSW (# [phone]), if You Have Questions, Need Assistance, or If Additional Social Work Needs Are Identified in The Near Future.      Please call the care guide team at [phone] if you need to cancel or reschedule your  appointment.   If you are experiencing a Mental Health or Behavioral Health Crisis or need someone to talk to, please call the Suicide and Crisis Lifeline: 988 call the [place] National Suicide Prevention Lifeline: 514 231 3310 or TTY: (803) 850-6069 TTY 703-179-1352) to talk to a trained counselor call 1-800-273-TALK (toll free, 24 hour hotline) go to [facility] [address] [place] [phone]) call the [facility] Crisis Line: [phone] call 911  Patient verbalizes understanding of instructions and care plan provided today and agrees to view in MyChart. Active MyChart status and patient understanding of how to access instructions and care plan via MyChart confirmed with patient.     No further follow up required.  Danford Bad, BSW, MSW, LCSW  Licensed Restaurant manager, fast food [facility]  Mailing [place] [address], [place], [place] [address] Physical Address[address], [place], [place] [address] Toll Free Main # [phone] Fax # [phone] Cell # [phone] [email or web address]

## 2022-11-07 NOTE — Telephone Encounter (Signed)
[name] from [facility] RN - home health nursing wound location mid [place] [facility] requesting orders for dressing  twice per week with alginate and dressing -instead of three times per week [phone] please advise

## 2022-11-08 ENCOUNTER — Encounter: Payer: Self-pay | Admitting: Family Medicine

## 2022-11-08 DIAGNOSIS — I480 Paroxysmal atrial fibrillation: Secondary | ICD-10-CM | POA: Diagnosis not present

## 2022-11-08 DIAGNOSIS — J449 Chronic obstructive pulmonary disease, unspecified: Secondary | ICD-10-CM | POA: Diagnosis not present

## 2022-11-08 DIAGNOSIS — E1142 Type 2 diabetes mellitus with diabetic polyneuropathy: Secondary | ICD-10-CM | POA: Diagnosis not present

## 2022-11-08 DIAGNOSIS — T8149XD Infection following a procedure, other surgical site, subsequent encounter: Secondary | ICD-10-CM | POA: Diagnosis not present

## 2022-11-08 DIAGNOSIS — I10 Essential (primary) hypertension: Secondary | ICD-10-CM | POA: Diagnosis not present

## 2022-11-08 DIAGNOSIS — M109 Gout, unspecified: Secondary | ICD-10-CM | POA: Diagnosis not present

## 2022-11-08 DIAGNOSIS — E039 Hypothyroidism, unspecified: Secondary | ICD-10-CM | POA: Diagnosis not present

## 2022-11-08 DIAGNOSIS — Z48815 Encounter for surgical aftercare following surgery on the digestive system: Secondary | ICD-10-CM | POA: Diagnosis not present

## 2022-11-09 ENCOUNTER — Ambulatory Visit: Payer: Medicare Other | Admitting: Family Medicine

## 2022-11-09 DIAGNOSIS — I10 Essential (primary) hypertension: Secondary | ICD-10-CM | POA: Diagnosis not present

## 2022-11-09 DIAGNOSIS — E039 Hypothyroidism, unspecified: Secondary | ICD-10-CM | POA: Diagnosis not present

## 2022-11-09 DIAGNOSIS — Z48815 Encounter for surgical aftercare following surgery on the digestive system: Secondary | ICD-10-CM | POA: Diagnosis not present

## 2022-11-09 DIAGNOSIS — T8149XD Infection following a procedure, other surgical site, subsequent encounter: Secondary | ICD-10-CM | POA: Diagnosis not present

## 2022-11-09 DIAGNOSIS — J449 Chronic obstructive pulmonary disease, unspecified: Secondary | ICD-10-CM | POA: Diagnosis not present

## 2022-11-09 DIAGNOSIS — E1142 Type 2 diabetes mellitus with diabetic polyneuropathy: Secondary | ICD-10-CM | POA: Diagnosis not present

## 2022-11-09 DIAGNOSIS — M109 Gout, unspecified: Secondary | ICD-10-CM | POA: Diagnosis not present

## 2022-11-09 DIAGNOSIS — I480 Paroxysmal atrial fibrillation: Secondary | ICD-10-CM | POA: Diagnosis not present

## 2022-11-09 NOTE — Telephone Encounter (Signed)
No reason to do the home health at this point She will be under the care of [facility]

## 2022-11-10 DIAGNOSIS — M109 Gout, unspecified: Secondary | ICD-10-CM | POA: Diagnosis not present

## 2022-11-10 DIAGNOSIS — J449 Chronic obstructive pulmonary disease, unspecified: Secondary | ICD-10-CM | POA: Diagnosis not present

## 2022-11-10 DIAGNOSIS — I10 Essential (primary) hypertension: Secondary | ICD-10-CM | POA: Diagnosis not present

## 2022-11-10 DIAGNOSIS — E039 Hypothyroidism, unspecified: Secondary | ICD-10-CM | POA: Diagnosis not present

## 2022-11-10 DIAGNOSIS — I480 Paroxysmal atrial fibrillation: Secondary | ICD-10-CM | POA: Diagnosis not present

## 2022-11-10 DIAGNOSIS — Z48815 Encounter for surgical aftercare following surgery on the digestive system: Secondary | ICD-10-CM | POA: Diagnosis not present

## 2022-11-10 DIAGNOSIS — E1142 Type 2 diabetes mellitus with diabetic polyneuropathy: Secondary | ICD-10-CM | POA: Diagnosis not present

## 2022-11-10 DIAGNOSIS — T8149XD Infection following a procedure, other surgical site, subsequent encounter: Secondary | ICD-10-CM | POA: Diagnosis not present

## 2022-11-14 DIAGNOSIS — I10 Essential (primary) hypertension: Secondary | ICD-10-CM | POA: Diagnosis not present

## 2022-11-14 DIAGNOSIS — J449 Chronic obstructive pulmonary disease, unspecified: Secondary | ICD-10-CM | POA: Diagnosis not present

## 2022-11-14 DIAGNOSIS — I482 Chronic atrial fibrillation, unspecified: Secondary | ICD-10-CM | POA: Diagnosis not present

## 2022-11-14 DIAGNOSIS — T8149XD Infection following a procedure, other surgical site, subsequent encounter: Secondary | ICD-10-CM | POA: Diagnosis not present

## 2022-11-14 DIAGNOSIS — M109 Gout, unspecified: Secondary | ICD-10-CM | POA: Diagnosis not present

## 2022-11-14 DIAGNOSIS — E039 Hypothyroidism, unspecified: Secondary | ICD-10-CM | POA: Diagnosis not present

## 2022-11-14 DIAGNOSIS — Z48815 Encounter for surgical aftercare following surgery on the digestive system: Secondary | ICD-10-CM | POA: Diagnosis not present

## 2022-11-14 DIAGNOSIS — I4891 Unspecified atrial fibrillation: Secondary | ICD-10-CM | POA: Diagnosis not present

## 2022-11-14 DIAGNOSIS — I480 Paroxysmal atrial fibrillation: Secondary | ICD-10-CM | POA: Diagnosis not present

## 2022-11-14 DIAGNOSIS — E1142 Type 2 diabetes mellitus with diabetic polyneuropathy: Secondary | ICD-10-CM | POA: Diagnosis not present

## 2022-11-15 DIAGNOSIS — F4323 Adjustment disorder with mixed anxiety and depressed mood: Secondary | ICD-10-CM | POA: Diagnosis not present

## 2022-11-17 DIAGNOSIS — I10 Essential (primary) hypertension: Secondary | ICD-10-CM | POA: Diagnosis not present

## 2022-11-17 DIAGNOSIS — Z48815 Encounter for surgical aftercare following surgery on the digestive system: Secondary | ICD-10-CM | POA: Diagnosis not present

## 2022-11-17 DIAGNOSIS — M109 Gout, unspecified: Secondary | ICD-10-CM | POA: Diagnosis not present

## 2022-11-17 DIAGNOSIS — I480 Paroxysmal atrial fibrillation: Secondary | ICD-10-CM | POA: Diagnosis not present

## 2022-11-17 DIAGNOSIS — E1142 Type 2 diabetes mellitus with diabetic polyneuropathy: Secondary | ICD-10-CM | POA: Diagnosis not present

## 2022-11-17 DIAGNOSIS — E039 Hypothyroidism, unspecified: Secondary | ICD-10-CM | POA: Diagnosis not present

## 2022-11-17 DIAGNOSIS — J449 Chronic obstructive pulmonary disease, unspecified: Secondary | ICD-10-CM | POA: Diagnosis not present

## 2022-11-17 DIAGNOSIS — T8149XD Infection following a procedure, other surgical site, subsequent encounter: Secondary | ICD-10-CM | POA: Diagnosis not present

## 2022-11-18 DIAGNOSIS — D519 Vitamin B12 deficiency anemia, unspecified: Secondary | ICD-10-CM | POA: Diagnosis not present

## 2022-11-18 DIAGNOSIS — F1120 Opioid dependence, uncomplicated: Secondary | ICD-10-CM | POA: Diagnosis not present

## 2022-11-18 DIAGNOSIS — E1142 Type 2 diabetes mellitus with diabetic polyneuropathy: Secondary | ICD-10-CM | POA: Diagnosis not present

## 2022-11-18 DIAGNOSIS — Z48815 Encounter for surgical aftercare following surgery on the digestive system: Secondary | ICD-10-CM | POA: Diagnosis not present

## 2022-11-18 DIAGNOSIS — E119 Type 2 diabetes mellitus without complications: Secondary | ICD-10-CM | POA: Diagnosis not present

## 2022-11-18 DIAGNOSIS — E559 Vitamin D deficiency, unspecified: Secondary | ICD-10-CM | POA: Diagnosis not present

## 2022-11-18 DIAGNOSIS — I480 Paroxysmal atrial fibrillation: Secondary | ICD-10-CM | POA: Diagnosis not present

## 2022-11-18 DIAGNOSIS — M109 Gout, unspecified: Secondary | ICD-10-CM | POA: Diagnosis not present

## 2022-11-18 DIAGNOSIS — J449 Chronic obstructive pulmonary disease, unspecified: Secondary | ICD-10-CM | POA: Diagnosis not present

## 2022-11-18 DIAGNOSIS — I10 Essential (primary) hypertension: Secondary | ICD-10-CM | POA: Diagnosis not present

## 2022-11-18 DIAGNOSIS — E782 Mixed hyperlipidemia: Secondary | ICD-10-CM | POA: Diagnosis not present

## 2022-11-18 DIAGNOSIS — E039 Hypothyroidism, unspecified: Secondary | ICD-10-CM | POA: Diagnosis not present

## 2022-11-18 DIAGNOSIS — T8149XD Infection following a procedure, other surgical site, subsequent encounter: Secondary | ICD-10-CM | POA: Diagnosis not present

## 2022-11-21 DIAGNOSIS — M109 Gout, unspecified: Secondary | ICD-10-CM | POA: Diagnosis not present

## 2022-11-21 DIAGNOSIS — F1120 Opioid dependence, uncomplicated: Secondary | ICD-10-CM | POA: Diagnosis not present

## 2022-11-21 DIAGNOSIS — I480 Paroxysmal atrial fibrillation: Secondary | ICD-10-CM | POA: Diagnosis not present

## 2022-11-21 DIAGNOSIS — G894 Chronic pain syndrome: Secondary | ICD-10-CM | POA: Diagnosis not present

## 2022-11-21 DIAGNOSIS — T8149XD Infection following a procedure, other surgical site, subsequent encounter: Secondary | ICD-10-CM | POA: Diagnosis not present

## 2022-11-21 DIAGNOSIS — Z48815 Encounter for surgical aftercare following surgery on the digestive system: Secondary | ICD-10-CM | POA: Diagnosis not present

## 2022-11-21 DIAGNOSIS — E1142 Type 2 diabetes mellitus with diabetic polyneuropathy: Secondary | ICD-10-CM | POA: Diagnosis not present

## 2022-11-21 DIAGNOSIS — I10 Essential (primary) hypertension: Secondary | ICD-10-CM | POA: Diagnosis not present

## 2022-11-21 DIAGNOSIS — E039 Hypothyroidism, unspecified: Secondary | ICD-10-CM | POA: Diagnosis not present

## 2022-11-21 DIAGNOSIS — J449 Chronic obstructive pulmonary disease, unspecified: Secondary | ICD-10-CM | POA: Diagnosis not present

## 2022-11-22 DIAGNOSIS — T8189XD Other complications of procedures, not elsewhere classified, subsequent encounter: Secondary | ICD-10-CM | POA: Diagnosis not present

## 2022-11-22 DIAGNOSIS — Z79899 Other long term (current) drug therapy: Secondary | ICD-10-CM | POA: Diagnosis not present

## 2022-11-22 DIAGNOSIS — T8149XD Infection following a procedure, other surgical site, subsequent encounter: Secondary | ICD-10-CM | POA: Diagnosis not present

## 2022-11-22 DIAGNOSIS — Z882 Allergy status to sulfonamides status: Secondary | ICD-10-CM | POA: Diagnosis not present

## 2022-11-22 DIAGNOSIS — Y838 Other surgical procedures as the cause of abnormal reaction of the patient, or of later complication, without mention of misadventure at the time of the procedure: Secondary | ICD-10-CM | POA: Diagnosis not present

## 2022-11-22 DIAGNOSIS — E079 Disorder of thyroid, unspecified: Secondary | ICD-10-CM | POA: Diagnosis not present

## 2022-11-22 DIAGNOSIS — Z881 Allergy status to other antibiotic agents status: Secondary | ICD-10-CM | POA: Diagnosis not present

## 2022-11-22 DIAGNOSIS — T8189XA Other complications of procedures, not elsewhere classified, initial encounter: Secondary | ICD-10-CM | POA: Diagnosis not present

## 2022-11-22 DIAGNOSIS — J449 Chronic obstructive pulmonary disease, unspecified: Secondary | ICD-10-CM | POA: Diagnosis not present

## 2022-11-22 DIAGNOSIS — Z885 Allergy status to narcotic agent status: Secondary | ICD-10-CM | POA: Diagnosis not present

## 2022-11-22 DIAGNOSIS — E039 Hypothyroidism, unspecified: Secondary | ICD-10-CM | POA: Diagnosis not present

## 2022-11-22 DIAGNOSIS — I480 Paroxysmal atrial fibrillation: Secondary | ICD-10-CM | POA: Diagnosis not present

## 2022-11-22 DIAGNOSIS — Z880 Allergy status to penicillin: Secondary | ICD-10-CM | POA: Diagnosis not present

## 2022-11-22 DIAGNOSIS — Z79890 Hormone replacement therapy (postmenopausal): Secondary | ICD-10-CM | POA: Diagnosis not present

## 2022-11-22 DIAGNOSIS — M109 Gout, unspecified: Secondary | ICD-10-CM | POA: Diagnosis not present

## 2022-11-22 DIAGNOSIS — K219 Gastro-esophageal reflux disease without esophagitis: Secondary | ICD-10-CM | POA: Diagnosis not present

## 2022-11-22 DIAGNOSIS — R109 Unspecified abdominal pain: Secondary | ICD-10-CM | POA: Diagnosis not present

## 2022-11-22 DIAGNOSIS — I10 Essential (primary) hypertension: Secondary | ICD-10-CM | POA: Diagnosis not present

## 2022-11-22 DIAGNOSIS — Z48815 Encounter for surgical aftercare following surgery on the digestive system: Secondary | ICD-10-CM | POA: Diagnosis not present

## 2022-11-22 DIAGNOSIS — E1142 Type 2 diabetes mellitus with diabetic polyneuropathy: Secondary | ICD-10-CM | POA: Diagnosis not present

## 2022-11-25 DIAGNOSIS — I480 Paroxysmal atrial fibrillation: Secondary | ICD-10-CM | POA: Diagnosis not present

## 2022-11-25 DIAGNOSIS — M109 Gout, unspecified: Secondary | ICD-10-CM | POA: Diagnosis not present

## 2022-11-25 DIAGNOSIS — J449 Chronic obstructive pulmonary disease, unspecified: Secondary | ICD-10-CM | POA: Diagnosis not present

## 2022-11-25 DIAGNOSIS — N390 Urinary tract infection, site not specified: Secondary | ICD-10-CM | POA: Diagnosis not present

## 2022-11-25 DIAGNOSIS — E1142 Type 2 diabetes mellitus with diabetic polyneuropathy: Secondary | ICD-10-CM | POA: Diagnosis not present

## 2022-11-25 DIAGNOSIS — Z48815 Encounter for surgical aftercare following surgery on the digestive system: Secondary | ICD-10-CM | POA: Diagnosis not present

## 2022-11-25 DIAGNOSIS — T8149XD Infection following a procedure, other surgical site, subsequent encounter: Secondary | ICD-10-CM | POA: Diagnosis not present

## 2022-11-25 DIAGNOSIS — I10 Essential (primary) hypertension: Secondary | ICD-10-CM | POA: Diagnosis not present

## 2022-11-25 DIAGNOSIS — E039 Hypothyroidism, unspecified: Secondary | ICD-10-CM | POA: Diagnosis not present

## 2022-11-27 DIAGNOSIS — F411 Generalized anxiety disorder: Secondary | ICD-10-CM | POA: Diagnosis not present

## 2022-11-27 DIAGNOSIS — G3184 Mild cognitive impairment, so stated: Secondary | ICD-10-CM | POA: Diagnosis not present

## 2022-11-27 DIAGNOSIS — F331 Major depressive disorder, recurrent, moderate: Secondary | ICD-10-CM | POA: Diagnosis not present

## 2022-11-27 DIAGNOSIS — G4700 Insomnia, unspecified: Secondary | ICD-10-CM | POA: Diagnosis not present

## 2022-11-28 DIAGNOSIS — M109 Gout, unspecified: Secondary | ICD-10-CM | POA: Diagnosis not present

## 2022-11-28 DIAGNOSIS — N390 Urinary tract infection, site not specified: Secondary | ICD-10-CM | POA: Diagnosis not present

## 2022-11-28 DIAGNOSIS — J449 Chronic obstructive pulmonary disease, unspecified: Secondary | ICD-10-CM | POA: Diagnosis not present

## 2022-11-28 DIAGNOSIS — Z48815 Encounter for surgical aftercare following surgery on the digestive system: Secondary | ICD-10-CM | POA: Diagnosis not present

## 2022-11-28 DIAGNOSIS — E039 Hypothyroidism, unspecified: Secondary | ICD-10-CM | POA: Diagnosis not present

## 2022-11-28 DIAGNOSIS — I480 Paroxysmal atrial fibrillation: Secondary | ICD-10-CM | POA: Diagnosis not present

## 2022-11-28 DIAGNOSIS — E1142 Type 2 diabetes mellitus with diabetic polyneuropathy: Secondary | ICD-10-CM | POA: Diagnosis not present

## 2022-11-28 DIAGNOSIS — T8149XD Infection following a procedure, other surgical site, subsequent encounter: Secondary | ICD-10-CM | POA: Diagnosis not present

## 2022-11-28 DIAGNOSIS — I10 Essential (primary) hypertension: Secondary | ICD-10-CM | POA: Diagnosis not present

## 2022-11-29 DIAGNOSIS — M109 Gout, unspecified: Secondary | ICD-10-CM | POA: Diagnosis not present

## 2022-11-29 DIAGNOSIS — E1142 Type 2 diabetes mellitus with diabetic polyneuropathy: Secondary | ICD-10-CM | POA: Diagnosis not present

## 2022-11-29 DIAGNOSIS — I10 Essential (primary) hypertension: Secondary | ICD-10-CM | POA: Diagnosis not present

## 2022-11-29 DIAGNOSIS — T8149XD Infection following a procedure, other surgical site, subsequent encounter: Secondary | ICD-10-CM | POA: Diagnosis not present

## 2022-11-29 DIAGNOSIS — E039 Hypothyroidism, unspecified: Secondary | ICD-10-CM | POA: Diagnosis not present

## 2022-11-29 DIAGNOSIS — F4323 Adjustment disorder with mixed anxiety and depressed mood: Secondary | ICD-10-CM | POA: Diagnosis not present

## 2022-11-29 DIAGNOSIS — Z48815 Encounter for surgical aftercare following surgery on the digestive system: Secondary | ICD-10-CM | POA: Diagnosis not present

## 2022-11-29 DIAGNOSIS — J449 Chronic obstructive pulmonary disease, unspecified: Secondary | ICD-10-CM | POA: Diagnosis not present

## 2022-11-29 DIAGNOSIS — I480 Paroxysmal atrial fibrillation: Secondary | ICD-10-CM | POA: Diagnosis not present

## 2022-11-30 DIAGNOSIS — Z48815 Encounter for surgical aftercare following surgery on the digestive system: Secondary | ICD-10-CM | POA: Diagnosis not present

## 2022-11-30 DIAGNOSIS — J449 Chronic obstructive pulmonary disease, unspecified: Secondary | ICD-10-CM | POA: Diagnosis not present

## 2022-11-30 DIAGNOSIS — I480 Paroxysmal atrial fibrillation: Secondary | ICD-10-CM | POA: Diagnosis not present

## 2022-11-30 DIAGNOSIS — T8149XD Infection following a procedure, other surgical site, subsequent encounter: Secondary | ICD-10-CM | POA: Diagnosis not present

## 2022-11-30 DIAGNOSIS — I10 Essential (primary) hypertension: Secondary | ICD-10-CM | POA: Diagnosis not present

## 2022-11-30 DIAGNOSIS — E039 Hypothyroidism, unspecified: Secondary | ICD-10-CM | POA: Diagnosis not present

## 2022-11-30 DIAGNOSIS — E1142 Type 2 diabetes mellitus with diabetic polyneuropathy: Secondary | ICD-10-CM | POA: Diagnosis not present

## 2022-11-30 DIAGNOSIS — M109 Gout, unspecified: Secondary | ICD-10-CM | POA: Diagnosis not present

## 2022-12-05 DIAGNOSIS — I480 Paroxysmal atrial fibrillation: Secondary | ICD-10-CM | POA: Diagnosis not present

## 2022-12-05 DIAGNOSIS — E1142 Type 2 diabetes mellitus with diabetic polyneuropathy: Secondary | ICD-10-CM | POA: Diagnosis not present

## 2022-12-05 DIAGNOSIS — Z48815 Encounter for surgical aftercare following surgery on the digestive system: Secondary | ICD-10-CM | POA: Diagnosis not present

## 2022-12-05 DIAGNOSIS — M109 Gout, unspecified: Secondary | ICD-10-CM | POA: Diagnosis not present

## 2022-12-05 DIAGNOSIS — J449 Chronic obstructive pulmonary disease, unspecified: Secondary | ICD-10-CM | POA: Diagnosis not present

## 2022-12-05 DIAGNOSIS — I10 Essential (primary) hypertension: Secondary | ICD-10-CM | POA: Diagnosis not present

## 2022-12-05 DIAGNOSIS — T8149XD Infection following a procedure, other surgical site, subsequent encounter: Secondary | ICD-10-CM | POA: Diagnosis not present

## 2022-12-05 DIAGNOSIS — E039 Hypothyroidism, unspecified: Secondary | ICD-10-CM | POA: Diagnosis not present

## 2022-12-06 DIAGNOSIS — F331 Major depressive disorder, recurrent, moderate: Secondary | ICD-10-CM | POA: Diagnosis not present

## 2022-12-06 DIAGNOSIS — F411 Generalized anxiety disorder: Secondary | ICD-10-CM | POA: Diagnosis not present

## 2022-12-07 DIAGNOSIS — I10 Essential (primary) hypertension: Secondary | ICD-10-CM | POA: Diagnosis not present

## 2022-12-07 DIAGNOSIS — E039 Hypothyroidism, unspecified: Secondary | ICD-10-CM | POA: Diagnosis not present

## 2022-12-07 DIAGNOSIS — T8149XD Infection following a procedure, other surgical site, subsequent encounter: Secondary | ICD-10-CM | POA: Diagnosis not present

## 2022-12-07 DIAGNOSIS — I480 Paroxysmal atrial fibrillation: Secondary | ICD-10-CM | POA: Diagnosis not present

## 2022-12-07 DIAGNOSIS — Z48815 Encounter for surgical aftercare following surgery on the digestive system: Secondary | ICD-10-CM | POA: Diagnosis not present

## 2022-12-07 DIAGNOSIS — M109 Gout, unspecified: Secondary | ICD-10-CM | POA: Diagnosis not present

## 2022-12-07 DIAGNOSIS — E1142 Type 2 diabetes mellitus with diabetic polyneuropathy: Secondary | ICD-10-CM | POA: Diagnosis not present

## 2022-12-07 DIAGNOSIS — J449 Chronic obstructive pulmonary disease, unspecified: Secondary | ICD-10-CM | POA: Diagnosis not present

## 2022-12-08 ENCOUNTER — Encounter: Payer: Self-pay | Admitting: *Deleted

## 2022-12-12 DIAGNOSIS — J449 Chronic obstructive pulmonary disease, unspecified: Secondary | ICD-10-CM | POA: Diagnosis not present

## 2022-12-12 DIAGNOSIS — M109 Gout, unspecified: Secondary | ICD-10-CM | POA: Diagnosis not present

## 2022-12-12 DIAGNOSIS — E039 Hypothyroidism, unspecified: Secondary | ICD-10-CM | POA: Diagnosis not present

## 2022-12-12 DIAGNOSIS — R350 Frequency of micturition: Secondary | ICD-10-CM | POA: Diagnosis not present

## 2022-12-12 DIAGNOSIS — I480 Paroxysmal atrial fibrillation: Secondary | ICD-10-CM | POA: Diagnosis not present

## 2022-12-12 DIAGNOSIS — N390 Urinary tract infection, site not specified: Secondary | ICD-10-CM | POA: Diagnosis not present

## 2022-12-12 DIAGNOSIS — R600 Localized edema: Secondary | ICD-10-CM | POA: Diagnosis not present

## 2022-12-12 DIAGNOSIS — T8149XD Infection following a procedure, other surgical site, subsequent encounter: Secondary | ICD-10-CM | POA: Diagnosis not present

## 2022-12-12 DIAGNOSIS — I10 Essential (primary) hypertension: Secondary | ICD-10-CM | POA: Diagnosis not present

## 2022-12-12 DIAGNOSIS — Z48815 Encounter for surgical aftercare following surgery on the digestive system: Secondary | ICD-10-CM | POA: Diagnosis not present

## 2022-12-12 DIAGNOSIS — M545 Low back pain, unspecified: Secondary | ICD-10-CM | POA: Diagnosis not present

## 2022-12-12 DIAGNOSIS — E1142 Type 2 diabetes mellitus with diabetic polyneuropathy: Secondary | ICD-10-CM | POA: Diagnosis not present

## 2022-12-13 ENCOUNTER — Encounter: Payer: Medicare Other | Admitting: *Deleted

## 2022-12-13 DIAGNOSIS — F331 Major depressive disorder, recurrent, moderate: Secondary | ICD-10-CM | POA: Diagnosis not present

## 2022-12-13 DIAGNOSIS — F411 Generalized anxiety disorder: Secondary | ICD-10-CM | POA: Diagnosis not present

## 2022-12-15 DIAGNOSIS — E039 Hypothyroidism, unspecified: Secondary | ICD-10-CM | POA: Diagnosis not present

## 2022-12-15 DIAGNOSIS — E1142 Type 2 diabetes mellitus with diabetic polyneuropathy: Secondary | ICD-10-CM | POA: Diagnosis not present

## 2022-12-15 DIAGNOSIS — I480 Paroxysmal atrial fibrillation: Secondary | ICD-10-CM | POA: Diagnosis not present

## 2022-12-15 DIAGNOSIS — M109 Gout, unspecified: Secondary | ICD-10-CM | POA: Diagnosis not present

## 2022-12-15 DIAGNOSIS — I10 Essential (primary) hypertension: Secondary | ICD-10-CM | POA: Diagnosis not present

## 2022-12-15 DIAGNOSIS — J449 Chronic obstructive pulmonary disease, unspecified: Secondary | ICD-10-CM | POA: Diagnosis not present

## 2022-12-15 DIAGNOSIS — T8149XD Infection following a procedure, other surgical site, subsequent encounter: Secondary | ICD-10-CM | POA: Diagnosis not present

## 2022-12-15 DIAGNOSIS — Z48815 Encounter for surgical aftercare following surgery on the digestive system: Secondary | ICD-10-CM | POA: Diagnosis not present

## 2022-12-19 DIAGNOSIS — R600 Localized edema: Secondary | ICD-10-CM | POA: Diagnosis not present

## 2022-12-19 DIAGNOSIS — G629 Polyneuropathy, unspecified: Secondary | ICD-10-CM | POA: Diagnosis not present

## 2022-12-19 DIAGNOSIS — R300 Dysuria: Secondary | ICD-10-CM | POA: Diagnosis not present

## 2022-12-19 DIAGNOSIS — Z48815 Encounter for surgical aftercare following surgery on the digestive system: Secondary | ICD-10-CM | POA: Diagnosis not present

## 2022-12-19 DIAGNOSIS — I480 Paroxysmal atrial fibrillation: Secondary | ICD-10-CM | POA: Diagnosis not present

## 2022-12-19 DIAGNOSIS — M109 Gout, unspecified: Secondary | ICD-10-CM | POA: Diagnosis not present

## 2022-12-19 DIAGNOSIS — J449 Chronic obstructive pulmonary disease, unspecified: Secondary | ICD-10-CM | POA: Diagnosis not present

## 2022-12-19 DIAGNOSIS — T8149XD Infection following a procedure, other surgical site, subsequent encounter: Secondary | ICD-10-CM | POA: Diagnosis not present

## 2022-12-19 DIAGNOSIS — G894 Chronic pain syndrome: Secondary | ICD-10-CM | POA: Diagnosis not present

## 2022-12-19 DIAGNOSIS — E1142 Type 2 diabetes mellitus with diabetic polyneuropathy: Secondary | ICD-10-CM | POA: Diagnosis not present

## 2022-12-19 DIAGNOSIS — E1143 Type 2 diabetes mellitus with diabetic autonomic (poly)neuropathy: Secondary | ICD-10-CM | POA: Diagnosis not present

## 2022-12-19 DIAGNOSIS — F339 Major depressive disorder, recurrent, unspecified: Secondary | ICD-10-CM | POA: Diagnosis not present

## 2022-12-19 DIAGNOSIS — E039 Hypothyroidism, unspecified: Secondary | ICD-10-CM | POA: Diagnosis not present

## 2022-12-19 DIAGNOSIS — I10 Essential (primary) hypertension: Secondary | ICD-10-CM | POA: Diagnosis not present

## 2022-12-20 DIAGNOSIS — F411 Generalized anxiety disorder: Secondary | ICD-10-CM | POA: Diagnosis not present

## 2022-12-20 DIAGNOSIS — Z885 Allergy status to narcotic agent status: Secondary | ICD-10-CM | POA: Diagnosis not present

## 2022-12-20 DIAGNOSIS — T8189XD Other complications of procedures, not elsewhere classified, subsequent encounter: Secondary | ICD-10-CM | POA: Diagnosis not present

## 2022-12-20 DIAGNOSIS — K439 Ventral hernia without obstruction or gangrene: Secondary | ICD-10-CM | POA: Diagnosis not present

## 2022-12-20 DIAGNOSIS — I10 Essential (primary) hypertension: Secondary | ICD-10-CM | POA: Diagnosis not present

## 2022-12-20 DIAGNOSIS — Z880 Allergy status to penicillin: Secondary | ICD-10-CM | POA: Diagnosis not present

## 2022-12-20 DIAGNOSIS — Z882 Allergy status to sulfonamides status: Secondary | ICD-10-CM | POA: Diagnosis not present

## 2022-12-20 DIAGNOSIS — F331 Major depressive disorder, recurrent, moderate: Secondary | ICD-10-CM | POA: Diagnosis not present

## 2022-12-20 DIAGNOSIS — Z79899 Other long term (current) drug therapy: Secondary | ICD-10-CM | POA: Diagnosis not present

## 2022-12-20 DIAGNOSIS — E079 Disorder of thyroid, unspecified: Secondary | ICD-10-CM | POA: Diagnosis not present

## 2022-12-20 DIAGNOSIS — M109 Gout, unspecified: Secondary | ICD-10-CM | POA: Diagnosis not present

## 2022-12-20 DIAGNOSIS — I482 Chronic atrial fibrillation, unspecified: Secondary | ICD-10-CM | POA: Diagnosis not present

## 2022-12-20 DIAGNOSIS — I700 Atherosclerosis of aorta: Secondary | ICD-10-CM | POA: Diagnosis not present

## 2022-12-20 DIAGNOSIS — K219 Gastro-esophageal reflux disease without esophagitis: Secondary | ICD-10-CM | POA: Diagnosis not present

## 2022-12-20 DIAGNOSIS — Z888 Allergy status to other drugs, medicaments and biological substances status: Secondary | ICD-10-CM | POA: Diagnosis not present

## 2022-12-20 DIAGNOSIS — K56609 Unspecified intestinal obstruction, unspecified as to partial versus complete obstruction: Secondary | ICD-10-CM | POA: Diagnosis not present

## 2022-12-22 DIAGNOSIS — E039 Hypothyroidism, unspecified: Secondary | ICD-10-CM | POA: Diagnosis not present

## 2022-12-22 DIAGNOSIS — M109 Gout, unspecified: Secondary | ICD-10-CM | POA: Diagnosis not present

## 2022-12-22 DIAGNOSIS — T8149XD Infection following a procedure, other surgical site, subsequent encounter: Secondary | ICD-10-CM | POA: Diagnosis not present

## 2022-12-22 DIAGNOSIS — I480 Paroxysmal atrial fibrillation: Secondary | ICD-10-CM | POA: Diagnosis not present

## 2022-12-22 DIAGNOSIS — J449 Chronic obstructive pulmonary disease, unspecified: Secondary | ICD-10-CM | POA: Diagnosis not present

## 2022-12-22 DIAGNOSIS — Z48815 Encounter for surgical aftercare following surgery on the digestive system: Secondary | ICD-10-CM | POA: Diagnosis not present

## 2022-12-22 DIAGNOSIS — E1142 Type 2 diabetes mellitus with diabetic polyneuropathy: Secondary | ICD-10-CM | POA: Diagnosis not present

## 2022-12-22 DIAGNOSIS — I10 Essential (primary) hypertension: Secondary | ICD-10-CM | POA: Diagnosis not present

## 2022-12-26 DIAGNOSIS — I10 Essential (primary) hypertension: Secondary | ICD-10-CM | POA: Diagnosis not present

## 2022-12-26 DIAGNOSIS — I480 Paroxysmal atrial fibrillation: Secondary | ICD-10-CM | POA: Diagnosis not present

## 2022-12-26 DIAGNOSIS — E039 Hypothyroidism, unspecified: Secondary | ICD-10-CM | POA: Diagnosis not present

## 2022-12-26 DIAGNOSIS — M109 Gout, unspecified: Secondary | ICD-10-CM | POA: Diagnosis not present

## 2022-12-26 DIAGNOSIS — Z48815 Encounter for surgical aftercare following surgery on the digestive system: Secondary | ICD-10-CM | POA: Diagnosis not present

## 2022-12-26 DIAGNOSIS — E1142 Type 2 diabetes mellitus with diabetic polyneuropathy: Secondary | ICD-10-CM | POA: Diagnosis not present

## 2022-12-26 DIAGNOSIS — T8149XD Infection following a procedure, other surgical site, subsequent encounter: Secondary | ICD-10-CM | POA: Diagnosis not present

## 2022-12-26 DIAGNOSIS — J449 Chronic obstructive pulmonary disease, unspecified: Secondary | ICD-10-CM | POA: Diagnosis not present

## 2022-12-27 DIAGNOSIS — F411 Generalized anxiety disorder: Secondary | ICD-10-CM | POA: Diagnosis not present

## 2022-12-27 DIAGNOSIS — F331 Major depressive disorder, recurrent, moderate: Secondary | ICD-10-CM | POA: Diagnosis not present

## 2022-12-28 DIAGNOSIS — I480 Paroxysmal atrial fibrillation: Secondary | ICD-10-CM | POA: Diagnosis not present

## 2022-12-28 DIAGNOSIS — I10 Essential (primary) hypertension: Secondary | ICD-10-CM | POA: Diagnosis not present

## 2022-12-28 DIAGNOSIS — E1142 Type 2 diabetes mellitus with diabetic polyneuropathy: Secondary | ICD-10-CM | POA: Diagnosis not present

## 2022-12-28 DIAGNOSIS — Z48815 Encounter for surgical aftercare following surgery on the digestive system: Secondary | ICD-10-CM | POA: Diagnosis not present

## 2022-12-28 DIAGNOSIS — M109 Gout, unspecified: Secondary | ICD-10-CM | POA: Diagnosis not present

## 2022-12-28 DIAGNOSIS — J449 Chronic obstructive pulmonary disease, unspecified: Secondary | ICD-10-CM | POA: Diagnosis not present

## 2022-12-28 DIAGNOSIS — T8149XD Infection following a procedure, other surgical site, subsequent encounter: Secondary | ICD-10-CM | POA: Diagnosis not present

## 2022-12-28 DIAGNOSIS — E039 Hypothyroidism, unspecified: Secondary | ICD-10-CM | POA: Diagnosis not present

## 2022-12-30 DIAGNOSIS — I4891 Unspecified atrial fibrillation: Secondary | ICD-10-CM | POA: Diagnosis not present

## 2022-12-30 DIAGNOSIS — M25561 Pain in right knee: Secondary | ICD-10-CM | POA: Diagnosis not present

## 2022-12-30 DIAGNOSIS — J449 Chronic obstructive pulmonary disease, unspecified: Secondary | ICD-10-CM | POA: Diagnosis not present

## 2022-12-30 DIAGNOSIS — K219 Gastro-esophageal reflux disease without esophagitis: Secondary | ICD-10-CM | POA: Diagnosis not present

## 2023-01-02 DIAGNOSIS — Z48815 Encounter for surgical aftercare following surgery on the digestive system: Secondary | ICD-10-CM | POA: Diagnosis not present

## 2023-01-02 DIAGNOSIS — M109 Gout, unspecified: Secondary | ICD-10-CM | POA: Diagnosis not present

## 2023-01-02 DIAGNOSIS — I482 Chronic atrial fibrillation, unspecified: Secondary | ICD-10-CM | POA: Diagnosis not present

## 2023-01-02 DIAGNOSIS — T8149XD Infection following a procedure, other surgical site, subsequent encounter: Secondary | ICD-10-CM | POA: Diagnosis not present

## 2023-01-02 DIAGNOSIS — E039 Hypothyroidism, unspecified: Secondary | ICD-10-CM | POA: Diagnosis not present

## 2023-01-02 DIAGNOSIS — I480 Paroxysmal atrial fibrillation: Secondary | ICD-10-CM | POA: Diagnosis not present

## 2023-01-02 DIAGNOSIS — N390 Urinary tract infection, site not specified: Secondary | ICD-10-CM | POA: Diagnosis not present

## 2023-01-02 DIAGNOSIS — E1142 Type 2 diabetes mellitus with diabetic polyneuropathy: Secondary | ICD-10-CM | POA: Diagnosis not present

## 2023-01-02 DIAGNOSIS — R600 Localized edema: Secondary | ICD-10-CM | POA: Diagnosis not present

## 2023-01-02 DIAGNOSIS — J449 Chronic obstructive pulmonary disease, unspecified: Secondary | ICD-10-CM | POA: Diagnosis not present

## 2023-01-02 DIAGNOSIS — I10 Essential (primary) hypertension: Secondary | ICD-10-CM | POA: Diagnosis not present

## 2023-01-03 DIAGNOSIS — F331 Major depressive disorder, recurrent, moderate: Secondary | ICD-10-CM | POA: Diagnosis not present

## 2023-01-03 DIAGNOSIS — F411 Generalized anxiety disorder: Secondary | ICD-10-CM | POA: Diagnosis not present

## 2023-01-05 DIAGNOSIS — M109 Gout, unspecified: Secondary | ICD-10-CM | POA: Diagnosis not present

## 2023-01-05 DIAGNOSIS — J449 Chronic obstructive pulmonary disease, unspecified: Secondary | ICD-10-CM | POA: Diagnosis not present

## 2023-01-05 DIAGNOSIS — Z48815 Encounter for surgical aftercare following surgery on the digestive system: Secondary | ICD-10-CM | POA: Diagnosis not present

## 2023-01-05 DIAGNOSIS — E1142 Type 2 diabetes mellitus with diabetic polyneuropathy: Secondary | ICD-10-CM | POA: Diagnosis not present

## 2023-01-05 DIAGNOSIS — M25561 Pain in right knee: Secondary | ICD-10-CM | POA: Diagnosis not present

## 2023-01-05 DIAGNOSIS — I480 Paroxysmal atrial fibrillation: Secondary | ICD-10-CM | POA: Diagnosis not present

## 2023-01-05 DIAGNOSIS — E039 Hypothyroidism, unspecified: Secondary | ICD-10-CM | POA: Diagnosis not present

## 2023-01-05 DIAGNOSIS — T8149XD Infection following a procedure, other surgical site, subsequent encounter: Secondary | ICD-10-CM | POA: Diagnosis not present

## 2023-01-05 DIAGNOSIS — I10 Essential (primary) hypertension: Secondary | ICD-10-CM | POA: Diagnosis not present

## 2023-01-06 DIAGNOSIS — N390 Urinary tract infection, site not specified: Secondary | ICD-10-CM | POA: Diagnosis not present

## 2023-01-06 DIAGNOSIS — M545 Low back pain, unspecified: Secondary | ICD-10-CM | POA: Diagnosis not present

## 2023-01-09 DIAGNOSIS — E1143 Type 2 diabetes mellitus with diabetic autonomic (poly)neuropathy: Secondary | ICD-10-CM | POA: Diagnosis not present

## 2023-01-09 DIAGNOSIS — G894 Chronic pain syndrome: Secondary | ICD-10-CM | POA: Diagnosis not present

## 2023-01-09 DIAGNOSIS — F339 Major depressive disorder, recurrent, unspecified: Secondary | ICD-10-CM | POA: Diagnosis not present

## 2023-01-09 DIAGNOSIS — G629 Polyneuropathy, unspecified: Secondary | ICD-10-CM | POA: Diagnosis not present

## 2023-01-09 DIAGNOSIS — M25561 Pain in right knee: Secondary | ICD-10-CM | POA: Diagnosis not present

## 2023-01-10 DIAGNOSIS — G894 Chronic pain syndrome: Secondary | ICD-10-CM | POA: Diagnosis not present

## 2023-01-10 DIAGNOSIS — G3184 Mild cognitive impairment, so stated: Secondary | ICD-10-CM | POA: Diagnosis not present

## 2023-01-10 DIAGNOSIS — F411 Generalized anxiety disorder: Secondary | ICD-10-CM | POA: Diagnosis not present

## 2023-01-10 DIAGNOSIS — F331 Major depressive disorder, recurrent, moderate: Secondary | ICD-10-CM | POA: Diagnosis not present

## 2023-01-16 DIAGNOSIS — N281 Cyst of kidney, acquired: Secondary | ICD-10-CM | POA: Insufficient documentation

## 2023-01-16 DIAGNOSIS — N390 Urinary tract infection, site not specified: Secondary | ICD-10-CM | POA: Insufficient documentation

## 2023-01-16 NOTE — Progress Notes (Unsigned)
Name: [name] DOB: 1924/01/03 MRN: [id number]  History of Present Illness: Ms. Laluz is a 89 y.o. female who presents today as a new patient at [facility]. All available relevant medical records have been reviewed. She is accompanied by Karoline Caldwell (Production manager) and lives in an assisted living facility.  - GU History: 1. Renal cysts. - 10/24/2022: CT abdomen/pelvis w/o contrast showed "Atrophic right kidney. No hydronephrosis. Unchanged low-attenuation left renal lesions, likely simple cysts, no specific follow-up imaging is recommended."  She reports chief complaint of recurrent UTls.  Urine culture results in past 12 months: - 04/06/2022: Positive for Klebsiella pneumoniae - 05/24/2022: Negative - 09/15/2022: Positive for Klebsiella pneumoniae  Urinary Symptoms: She reports "I've had a UTI for a year." When present, UTI symptoms include dysuria, increased urinary urgency, frequency. She denies acute UTI symptoms today.  At baseline: She reports external burning sensation in vagina.  Denies urinary urgency, frequency, dysuria, gross hematuria, hesitancy, straining to void, or sensations of incomplete emptying.  She reports urine leakage without sensory awareness - possible urge incontinence. She denies stress incontinence. She reports nocturnal enuresis. She leaks 3+ times per day and changes her pad at least 3 times per day. She reports minimal caffeine intake.  She denies history of kidney stones.  Vaginal / prolapse Symptoms: She denies vaginal bulge sensation.  She denies seeing a vaginal bulge.  She denies vaginal pain, bleeding, or discharge.  She denies use of topical vaginal estrogen cream.  Bowel Symptoms: She denies constipation, diarrhea, rectal bleeding, pain with defecation, straining to defecate, or fecal incontinence.   Past OB/GYN History: OB History     Gravida  2   Para  1   Term      Preterm      AB  1    Living  1      SAB  1   IAB      Ectopic      Multiple      Live Births             She denies being sexually active.  She is post menopausal.  She denies history of hysterectomy.  Fall Screening: Do you usually have a device to assist in your mobility? Yes - walker    Medications: Current Outpatient Medications  Medication Sig Dispense Refill   allopurinol (ZYLOPRIM) 100 MG tablet TAKE (1/2) TABLET BY MOUTH DAILY. 15 tablet 6   ALPRAZolam (XANAX) 0.25 MG tablet 1/2 tablet taken once daily as needed for anxiety 15 tablet 0   carbamide peroxide (DEBROX) 6.5 % OTIC solution Place 5 drops into both ears 2 (two) times daily as needed (as needed for ear wax build up). 15 mL 0   Cyanocobalamin (B-12) 1000 MCG TABS TAKE ONE TABLET BY MOUTHTONCE DAILY.     DAILY MULTIPLE VITAMINS PO TAKE ONE TABLET BY MOUTHSONCE DAILY.     diltiazem (CARDIZEM CD) 120 MG 24 hr capsule Take 1 capsule (120 mg total) by mouth daily. 90 capsule 1   estradiol (ESTRACE) 0.1 MG/GM vaginal cream Discard plastic applicator. Insert a blueberry size amount (approximately 1 gram) of cream on fingertip inside vagina at bedtime every night (for long term use). 30 g 3   HYDROcodone-acetaminophen (NORCO/VICODIN) 5-325 MG tablet One half or 1 tablet taken once daily as needed maximum in a days time is 1 pill 20 tablet 0   levothyroxine (SYNTHROID) 75 MCG tablet Take 1 tablet (75 mcg total) by mouth daily.  90 tablet 1   lisinopril (ZESTRIL) 2.5 MG tablet Take 1 tablet (2.5 mg total) by mouth daily. 90 tablet 1   Multiple Vitamin (MULTIVITAMIN WITH MINERALS) TABS tablet Take 1 tablet by mouth daily.     ondansetron (ZOFRAN) 4 MG tablet Take 1 tablet (4 mg total) by mouth every 8 (eight) hours as needed for nausea or vomiting. 20 tablet 0   ondansetron (ZOFRAN-ODT) 4 MG disintegrating tablet Take 1 tablet (4 mg total) by mouth every 8 (eight) hours as needed for up to 10 doses for nausea or vomiting. 10 tablet 0    oxymetazoline (AFRIN) 0.05 % nasal spray Place 1 spray into both nostrils 2 (two) times daily.     pantoprazole (PROTONIX) 40 MG tablet TAKE 1 TABLET BY MOUTH ONCE DAILY. 90 tablet 1   polyethylene glycol powder (GLYCOLAX/MIRALAX) 17 GM/SCOOP powder Take 17 g by mouth daily.     potassium chloride (KLOR-CON) 10 MEQ tablet TAKE ONE TABLET BY MOUTH ONCE DAILY. 30 tablet 0   Propylene Glycol (SYSTANE COMPLETE OP) Apply 1 drop to eye daily as needed (dryness).     QC ACETAMINOPHEN 8 HOURS 650 MG CR tablet Take 1 tablet by mouth in the morning and at bedtime.     sertraline (ZOLOFT) 50 MG tablet Take 1 tablet (50 mg total) by mouth daily. 30 tablet 4   torsemide (DEMADEX) 20 MG tablet TAKE (1) TABLET BY MOUTH EVERY OTHER DAY. 45 tablet 1   trolamine salicylate (ASPERCREME) 10 % cream Apply 1 application topically as needed (feet and shoulders).     vitamin B-12 1000 MCG tablet Take 1 tablet (1,000 mcg total) by mouth daily. 30 tablet 1   No current facility-administered medications for this visit.    Allergies: Allergies  Allergen Reactions   Amiodarone Itching   Cephalosporins Itching   Meloxicam Itching   Robaxin [Methocarbamol] Itching   Amoxicillin Hives   Lovenox [Enoxaparin Sodium] Nausea And Vomiting   Morphine Other (See Comments)    Unknown Other reaction(s): Not available   Morphine And Codeine Itching   Penicillins Swelling        Sulfa Antibiotics Swelling   Lipitor [Atorvastatin] Other (See Comments)    Unknown:patient is not aware of this allergy   Lorazepam Other (See Comments)    Exceptional fatigue    Macrobid [Nitrofurantoin] Other (See Comments)    Constipation   Metoprolol     hallucinations    Past Medical History:  Diagnosis Date   Abdominal pain, chronic, right lower quadrant    "ever since I had my appendix removed"   Anxiety    Atrial fibrillation (HCC)    Breast cancer (HCC)    Cancer (HCC)    left side   Chronic back pain    Edema    GERD  (gastroesophageal reflux disease)    Hyperlipidemia    Hypertension    Osteopenia    Osteoporosis    Ovarian cyst 02/2015   left   Recurrent abdominal pain    LLQ   Past Surgical History:  Procedure Laterality Date   APPENDECTOMY     APPENDECTOMY     BREAST SURGERY Left    lumpectomy   CARDIOVERSION N/A 03/31/2016   Procedure: CARDIOVERSION;  Surgeon: Wendall Stade, MD;  Location: [facility];  Service: Cardiovascular;  Laterality: N/A;   COLON RESECTION     COLON SURGERY     HERNIA REPAIR     HERNIA REPAIR  Family History  Problem Relation Age of Onset   Diabetes Mother    Heart disease Other    Arthritis Other    Cancer Other    Hyperlipidemia Brother    Social History   Socioeconomic History   Marital status: Widowed    Spouse name: Not on file   Number of children: 1   Years of education: 84   Highest education level: 12th grade  Occupational History    Employer: RETIRED  Tobacco Use   Smoking status: Never    Passive exposure: Never   Smokeless tobacco: Never  Vaping Use   Vaping status: Never Used  Substance and Sexual Activity   Alcohol use: No    Alcohol/week: 0.0 standard drinks of alcohol   Drug use: No   Sexual activity: Not Currently    Partners: Male  Other Topics Concern   Not on file  Social History Narrative   Not on file   Social Determinants of Health   Financial Resource Strain: Low Risk  (08/24/2022)   Overall Financial Resource Strain (CARDIA)    Difficulty of Paying Living Expenses: Not hard at all  Food Insecurity: No Food Insecurity (08/24/2022)   Hunger Vital Sign    Worried About Running Out of Food in the Last Year: Never true    Ran Out of Food in the Last Year: Never true  Transportation Needs: No Transportation Needs (08/24/2022)   PRAPARE - Administrator, Civil Service (Medical): No    Lack of Transportation (Non-Medical): No  Physical Activity: Inactive (08/24/2022)   Exercise Vital Sign    Days of Exercise per  Week: 0 days    Minutes of Exercise per Session: 0 min  Stress: No Stress Concern Present (08/24/2022)   [employer] of Occupational Health - Occupational Stress Questionnaire    Feeling of Stress : Only a little  Social Connections: Moderately Integrated (08/24/2022)   Social Connection and Isolation Panel [NHANES]    Frequency of Communication with Friends and Family: More than three times a week    Frequency of Social Gatherings with Friends and Family: More than three times a week    Attends Religious Services: More than 4 times per year    Active Member of [employer] or Organizations: Yes    Attends Banker Meetings: More than 4 times per year    Marital Status: Widowed  Intimate Partner Violence: Not At Risk (08/24/2022)   Humiliation, Afraid, Rape, and Kick questionnaire    Fear of Current or Ex-Partner: No    Emotionally Abused: No    Physically Abused: No    Sexually Abused: No    SUBJECTIVE  Review of Systems Constitutional: Patient denies any unintentional weight loss or change in strength lntegumentary: Patient denies any rashes or pruritus Cardiovascular: Patient denies chest pain or syncope Respiratory: Patient denies shortness of breath Gastrointestinal: Patient denies nausea, vomiting, constipation, or diarrhea Musculoskeletal: Patient denies muscle cramps or weakness Neurologic: Patient denies convulsions or seizures Psychiatric: Patient denies memory problems Allergic/Immunologic: Patient denies recent allergic reaction(s) Hematologic/Lymphatic: Patient denies bleeding tendencies Endocrine: Patient denies heat/cold intolerance  GU: As per HPI.  OBJECTIVE Vitals:   01/18/23 0905  BP: 121/68  Pulse: 88  Temp: 98.8 F (37.1 C)   There is no height or weight on file to calculate BMI.  Physical Examination Constitutional: No obvious distress; patient is non-toxic appearing  Cardiovascular: No visible lower extremity edema.  Respiratory: The  patient does not have  audible wheezing/stridor; respirations do not appear labored  Gastrointestinal: Abdomen non-distended Musculoskeletal: Normal ROM of UEs  Skin: No obvious rashes/open sores  Neurologic: CN 2-12 grossly intact Psychiatric: Answered questions appropriately with normal affect  Hematologic/Lymphatic/Immunologic: No obvious bruises or sites of spontaneous bleeding  UA: negative  PVR: >132 ml  ASSESSMENT Recurrent UTI - Plan: Urinalysis, Routine w reflex microscopic, BLADDER SCAN AMB NON-IMAGING, estradiol (ESTRACE) 0.1 MG/GM vaginal cream  Renal cyst - Plan: Urinalysis, Routine w reflex microscopic, BLADDER SCAN AMB NON-IMAGING  Atrophic vaginitis - Plan: estradiol (ESTRACE) 0.1 MG/GM vaginal cream  Recurrent UTls:  We discussed the possible etiologies of recurrent UTls including ascending infection related to intercourse; vaginal atrophy; transmural infection that has been treated incompletely; urinary tract stones; incomplete bladder emptying with urinary stasis; kidney or bladder tumor; urethral diverticulum; and colonization of  vagina and urinary tract with pathologic, adherent organisms.   For UTI prevention we discussed options including: Adequate fluid intake (>1.5 liters/day) to flush out the urinary tract. - Go to the bathroom to urinate every 4-6 hours while awake to minimize urinary stasis / bacterial overgrowth in the bladder. - Proanthocyanidin (PAC) supplement 36 mg daily; must be soluble (insoluble form of PAC will be ineffective). Recommend Ellura. Vitamin C supplement Probiotic to maintain healthy vaginal microbiome - Topical vaginal estrogen for vaginal atrophy. The etiology and consequences of urogenital epithelial atrophy was explained to patient. The thinning of the epithelium of the urethra can contribute to urinary urgency and frequency syndromes. In addition, the normal bacterial flora that colonizes the perineum may contribute to UTI risk because  the thin urethral epithelium allows the bacteria to become adherent and the change in vaginal pH can disrupt the vaginal / urethral microbiome and allow for bacterial overgrowth. Patient was advised that topical vaginal estrogen replacement will take about 3 months to restore the vaginal pH and may sting/burn initially due to severe dryness, which will improve with ongoing treatment. OK to have sex with any of the topical vaginal estrogen replacement options. We discussed that there have been studies that evaluate use of low-dose intravaginal estrogen cream that shows minimal systemic absorption that is negligible after 3 weeks. There have been no studies indicating increased risk of contributing to breast cancer development or recurrence.  If recurrent UTls persist, may consider UTI prophylaxis with a daily low dose antibiotic in the future.  Advised pt not to use D-mannose for UTI prevention due to sugar content given patient's history of diabetes.  For management of acute UTI symptoms: - Patient was advised that if/when UTI-like symptoms occur they can call our office to speak with a nurse, who can place an order for urinalysis (with reflex to urine culture). They may then proceed to a [facility] to provide their urine sample. This is required for evaluation in order for their Urology provider to make informed treatment recommendation(s), which may or may not include an antibiotic prescription. - Discussed option to take over-the-counter Pyridium (commonly known under the AZO brand name) for bladder pain. No more than 3 days at a time.  Patient ultimately decided to start with topical vaginal estrogen cream.   2. Vaginal atrophy.  3. Renal cyst. Asymptomatic; no further workup / imaging required.  Will plan for follow up in 3 months or sooner if needed. Pt verbalized understanding and agreement. All questions were answered.  PLAN Advised the following: 1. Apply topical vaginal  estrogen cream nightly. 2. Adequate fluid intake (>1.5 liters/day) to flush out the  urinary tract. 3. Urinate every 4-6 hours while awake to minimize urinary stasis/ bacterial overgrowth in the bladder. 4. Return in about 3 months (around 04/20/2023) for UA, PVR, & f/u with Evette Georges NP.  Orders Placed This Encounter  Procedures   Urinalysis, Routine w reflex microscopic   BLADDER SCAN AMB NON-IMAGING    It has been explained that the patient is to follow regularly with their PCP in addition to all other providers involved in their care and to follow instructions provided by these respective offices. Patient advised to contact urology clinic if any urologic-pertaining questions, concerns, new symptoms or problems arise in the interim period.  Patient Instructions  Recommendations regarding UTI prevention / management:  When UTI symptoms occur: Call urology office to request order for urine culture. We recommend waiting for urine culture result prior to use of any antibiotics.  For bladder pain/ burning with urination: Over the counter Pyridium (phenazopyridine) as needed (commonly known under the "AZO" brand). No more than 3 days consecutively at a time due to risk for methemoglobinemia, liver function issues, and bone health damage with long term use of Pyridium.  Routine use for UTI prevention: - Topical vaginal estrogen for vaginal atrophy. Adequate fluid intake (>1.5 liters/day) to flush out the urinary tract. - Go to the bathroom to urinate every 4-6 hours while awake to minimize urinary stasis / bacterial overgrowth in the bladder. - Proanthocyanidin (PAC) supplement 36 mg daily; must be soluble (insoluble form of PAC will be ineffective). Recommended brand: Ellura. This is an over-the-counter supplement (often must be found/ purchased online) supplement derived from cranberries with concentrated active component: Proanthocyanidin (PAC) 36 mg daily. Decreases bacterial adherence  to bladder lining. Not recommended for patients with interstitial cystitis due to acidity. - Vitamin C supplement to acidify urine to minimize bacterial growth. Not recommended for patients with interstitial cystitis due to acidity. - Probiotic to maintain healthy vaginal microbiome to suppress bacteria at urethral opening. Brand recommendations: Feminine Balance (highest concentration of lactobacillus) or Hyperbiotic Pro 15.  Note for patients with diabetes: You may read about D-mannose powder for UTI prevention. That is an over the-counter supplement which decreases bacterial adherence to bladder lining. I would NOT advise that for you as a person with diabetes due to its sugar content.   Electronically signed by:  Donnita Falls, MSN, FNP-C, CUNP 01/18/2023 9:34 AM

## 2023-01-17 DIAGNOSIS — F331 Major depressive disorder, recurrent, moderate: Secondary | ICD-10-CM | POA: Diagnosis not present

## 2023-01-17 DIAGNOSIS — F411 Generalized anxiety disorder: Secondary | ICD-10-CM | POA: Diagnosis not present

## 2023-01-18 ENCOUNTER — Encounter: Payer: Self-pay | Admitting: Urology

## 2023-01-18 ENCOUNTER — Ambulatory Visit (INDEPENDENT_AMBULATORY_CARE_PROVIDER_SITE_OTHER): Payer: Medicare Other | Admitting: Urology

## 2023-01-18 VITALS — BP 121/68 | HR 88 | Temp 98.8°F

## 2023-01-18 DIAGNOSIS — N952 Postmenopausal atrophic vaginitis: Secondary | ICD-10-CM

## 2023-01-18 DIAGNOSIS — Z87440 Personal history of urinary (tract) infections: Secondary | ICD-10-CM

## 2023-01-18 DIAGNOSIS — N390 Urinary tract infection, site not specified: Secondary | ICD-10-CM | POA: Diagnosis not present

## 2023-01-18 DIAGNOSIS — N281 Cyst of kidney, acquired: Secondary | ICD-10-CM | POA: Diagnosis not present

## 2023-01-18 LAB — URINALYSIS, ROUTINE W REFLEX MICROSCOPIC
Bilirubin, UA: NEGATIVE
Glucose, UA: NEGATIVE
Ketones, UA: NEGATIVE
Leukocytes,UA: NEGATIVE
Nitrite, UA: NEGATIVE
Protein,UA: NEGATIVE
RBC, UA: NEGATIVE
Specific Gravity, UA: 1.01 (ref 1.005–1.030)
Urobilinogen, Ur: 0.2 mg/dL (ref 0.2–1.0)
pH, UA: 5.5 (ref 5.0–7.5)

## 2023-01-18 LAB — BLADDER SCAN AMB NON-IMAGING: Scan Result: >132

## 2023-01-18 MED ORDER — ESTRADIOL 0.1 MG/GM VA CREA
TOPICAL_CREAM | VAGINAL | 3 refills | Status: DC
Start: 2023-01-18 — End: 2023-06-07

## 2023-01-18 NOTE — Patient Instructions (Signed)
Recommendations regarding UTI prevention / management:  When UTI symptoms occur: Call urology office to request order for urine culture. We recommend waiting for urine culture result prior to use of any antibiotics.   For bladder pain/ burning with urination: Over the counter Pyridium (phenazopyridine) as needed (commonly known under the "AZO" brand). No more than 3 days consecutively at a time due to risk for methemoglobinemia, liver function issues, and bone health damage with long term use of Pyridium.  Routine use for UTI prevention: - Topical vaginal estrogen for vaginal atrophy. Adequate fluid intake {>1.5 liters/day) to flush out the urinary tract. - Go to the bathroom to urinate every 4-6 hours while awake to minimize urinary stasis / bacterial overgrowth in the bladder. - Proanthocyanidin (PAC) supplement 36 mg daily; must be soluble (insoluble form of PAC will be ineffective). Recommended brand: Ellura. This is an over-the-counter supplement (often must be found/ purchased online) supplement derived from cranberries with concentrated active component: Proanthocyanidin (PAC) 36 mg daily. Decreases bacterial adherence to bladder lining. Not recommended for patients with interstitial cystitis due to acidity. - Vitamin C supplement to acidify urine to minimize bacterial growth. Not recommended for patients with interstitial cystitis due to acidity. - Probiotic to maintain healthy vaginal microbiome to suppress bacteria at urethral opening. Brand recommendations: Feminine Balance (highest concentration of lactobacillus) or Hyperbiotic Pro 15.  Note for patients with diabetes: You may read about D-mannose powder for UTI prevention. That is an over the-counter supplement which decreases bacterial adherence to bladder lining. I would NOT advise that for you as a person with diabetes due to its sugar content.  

## 2023-01-24 DIAGNOSIS — F411 Generalized anxiety disorder: Secondary | ICD-10-CM | POA: Diagnosis not present

## 2023-01-24 DIAGNOSIS — F331 Major depressive disorder, recurrent, moderate: Secondary | ICD-10-CM | POA: Diagnosis not present

## 2023-01-30 DIAGNOSIS — E119 Type 2 diabetes mellitus without complications: Secondary | ICD-10-CM | POA: Diagnosis not present

## 2023-01-30 DIAGNOSIS — J449 Chronic obstructive pulmonary disease, unspecified: Secondary | ICD-10-CM | POA: Diagnosis not present

## 2023-01-30 DIAGNOSIS — M25561 Pain in right knee: Secondary | ICD-10-CM | POA: Diagnosis not present

## 2023-01-31 DIAGNOSIS — F411 Generalized anxiety disorder: Secondary | ICD-10-CM | POA: Diagnosis not present

## 2023-01-31 DIAGNOSIS — F331 Major depressive disorder, recurrent, moderate: Secondary | ICD-10-CM | POA: Diagnosis not present

## 2023-02-03 DIAGNOSIS — I10 Essential (primary) hypertension: Secondary | ICD-10-CM | POA: Diagnosis not present

## 2023-02-03 DIAGNOSIS — F5101 Primary insomnia: Secondary | ICD-10-CM | POA: Diagnosis not present

## 2023-02-03 DIAGNOSIS — M6281 Muscle weakness (generalized): Secondary | ICD-10-CM | POA: Diagnosis not present

## 2023-02-03 DIAGNOSIS — J449 Chronic obstructive pulmonary disease, unspecified: Secondary | ICD-10-CM | POA: Diagnosis not present

## 2023-02-06 DIAGNOSIS — G894 Chronic pain syndrome: Secondary | ICD-10-CM | POA: Diagnosis not present

## 2023-02-06 DIAGNOSIS — M25561 Pain in right knee: Secondary | ICD-10-CM | POA: Diagnosis not present

## 2023-02-06 DIAGNOSIS — G629 Polyneuropathy, unspecified: Secondary | ICD-10-CM | POA: Diagnosis not present

## 2023-02-07 DIAGNOSIS — F411 Generalized anxiety disorder: Secondary | ICD-10-CM | POA: Diagnosis not present

## 2023-02-07 DIAGNOSIS — G894 Chronic pain syndrome: Secondary | ICD-10-CM | POA: Diagnosis not present

## 2023-02-07 DIAGNOSIS — G3184 Mild cognitive impairment, so stated: Secondary | ICD-10-CM | POA: Diagnosis not present

## 2023-02-07 DIAGNOSIS — F331 Major depressive disorder, recurrent, moderate: Secondary | ICD-10-CM | POA: Diagnosis not present

## 2023-02-09 DIAGNOSIS — E1143 Type 2 diabetes mellitus with diabetic autonomic (poly)neuropathy: Secondary | ICD-10-CM | POA: Diagnosis not present

## 2023-02-09 DIAGNOSIS — F339 Major depressive disorder, recurrent, unspecified: Secondary | ICD-10-CM | POA: Diagnosis not present

## 2023-02-14 DIAGNOSIS — F331 Major depressive disorder, recurrent, moderate: Secondary | ICD-10-CM | POA: Diagnosis not present

## 2023-02-14 DIAGNOSIS — F411 Generalized anxiety disorder: Secondary | ICD-10-CM | POA: Diagnosis not present

## 2023-02-16 ENCOUNTER — Ambulatory Visit: Payer: Medicare Other | Admitting: Orthopedic Surgery

## 2023-02-16 ENCOUNTER — Encounter: Payer: Self-pay | Admitting: Orthopedic Surgery

## 2023-02-16 VITALS — BP 147/67 | HR 70 | Ht 64.0 in | Wt 145.0 lb

## 2023-02-16 DIAGNOSIS — M5416 Radiculopathy, lumbar region: Secondary | ICD-10-CM | POA: Diagnosis not present

## 2023-02-16 MED ORDER — GABAPENTIN 100 MG PO CAPS
ORAL_CAPSULE | ORAL | 2 refills | Status: DC
Start: 2023-02-16 — End: 2023-06-17

## 2023-02-16 MED ORDER — IBUPROFEN 800 MG PO TABS
800.0000 mg | ORAL_TABLET | Freq: Three times a day (TID) | ORAL | 0 refills | Status: DC | PRN
Start: 2023-02-16 — End: 2023-06-16

## 2023-02-16 NOTE — Progress Notes (Addendum)
Office Visit Note   Patient: [name]           Date of Birth: 05-19-1924           MRN: [id number] Visit Date: 02/16/2023 Requested by: No referring provider defined for this encounter. PCP: No primary care provider on file.   Assessment & Plan:   Encounter Diagnosis  Name Primary?   Lumbar radiculopathy Yes    Meds ordered this encounter  Medications   ibuprofen (ADVIL) 800 MG tablet    Sig: Take 1 tablet (800 mg total) by mouth every 8 (eight) hours as needed.    Dispense:  90 tablet    Refill:  0   gabapentin (NEURONTIN) 100 MG capsule    Sig: 200 mg tid    Dispense:  90 capsule    Refill:  27    89 year old female who is on anti-inflammatories Neurontin and hydrocodone presents with "knee pain" her pain is actually radicular in nature starts in her right lower back runs all the way down her right leg across the knee and into the right foot  My recommendations are to increase her gabapentin to 200 mg 3 times daily take ibuprofen 800 mg 3 times daily and see neurosurgery if she does not improve I also ordered physical therapy.   Subjective: Chief Complaint  Patient presents with   Knee Pain    Right has xray report but no images xray done at facility 01/05/23    HPI: 89 year old female complains of right knee pain x-ray report says no joint effusion mild osteopenia moderate arthritis no fracture or dislocation.  No images were available for viewing they were not repeated as there was no indication for that  Patient complains of right knee pain but upon further and closer examination she is having pain down her right leg into her right foot no trauma she is already on hydrocodone and gabapentin                ROS: Currently no prescription chest pain or shortness of breath   Images personally read and my interpretation : No imaging was reviewed report read into the chart  Visit Diagnoses:  1. Lumbar radiculopathy      Follow-Up Instructions: Return if  symptoms worsen or fail to improve.    Objective: Vital Signs: BP (!) 147/67   Pulse 70   Ht 5\' 4"  (1.626 m)   Wt 145 lb (65.8 kg)   BMI 24.89 kg/m   Physical Exam  She is awake alert and oriented x 2 possibly 3.  She is sitting in a wheelchair.  She is interactive with the examiner appropriately. Ortho Exam Right knee lateral tenderness and then right leg lateral tenderness from the hip lower back area down into the lateral side of the right foot and the L5 root distribution range of motion of knee limited slight valgus deformity.  The knee comes out to extension shy 5 degrees flexes to about 115 degrees feel stable muscle tone normal no atrophy  Lower back tenderness primarily and then tenderness down the leg down the L5 root and dermatome  Specialty Comments:  No specialty comments available.  Imaging: No results found.   PMFS History: Patient Active Problem List   Diagnosis Date Noted   Recurrent UTI 01/16/2023   Renal cyst 01/16/2023   Paroxysmal atrial fibrillation (HCC) 09/25/2022   Hypertension 09/25/2022   Stage 3b chronic kidney disease (HCC) 06/16/2022   Head trauma 12/16/2021  Acute gout due to renal impairment involving foot 08/09/2021   Nausea and vomiting 11/07/2019   Right hip pain 08/01/2019   HLD (hyperlipidemia) 07/18/2017   B12 deficiency 07/18/2017   SAH (subarachnoid hemorrhage) (HCC) 07/17/2017   ICH (intracerebral hemorrhage) (HCC) 07/16/2017   Cervical disc disease 05/09/2017   Hypothyroid 05/09/2017   Osteoarthritis of right knee 12/15/2015   Major depression 09/22/2015   Ventral hernia 05/21/2015   Ovarian tumor 04/13/2015   Squamous cell skin cancer 08/13/2014   Impaired fasting glucose 08/05/2013   Neuropathy 01/11/2013   Anxiety 01/29/2012   GERD (gastroesophageal reflux disease) 01/29/2012   Sciatica of right side 05/10/2011   Past Medical History:  Diagnosis Date   Abdominal pain, chronic, right lower quadrant    "ever since I  had my appendix removed"   Anxiety    Atrial fibrillation (HCC)    Breast cancer (HCC)    Cancer (HCC)    left side   Chronic back pain    Edema    GERD (gastroesophageal reflux disease)    Hyperlipidemia    Hypertension    Osteopenia    Osteoporosis    Ovarian cyst 02/2015   left   Recurrent abdominal pain    LLQ    Family History  Problem Relation Age of Onset   Diabetes Mother    Heart disease Other    Arthritis Other    Cancer Other    Hyperlipidemia Brother     Past Surgical History:  Procedure Laterality Date   APPENDECTOMY     APPENDECTOMY     BREAST SURGERY Left    lumpectomy   CARDIOVERSION N/A 03/31/2016   Procedure: CARDIOVERSION;  Surgeon: Wendall Stade, MD;  Location: [facility];  Service: Cardiovascular;  Laterality: N/A;   COLON RESECTION     COLON SURGERY     HERNIA REPAIR     HERNIA REPAIR     Social History   Occupational History    Employer: RETIRED  Tobacco Use   Smoking status: Never    Passive exposure: Never   Smokeless tobacco: Never  Vaping Use   Vaping status: Never Used  Substance and Sexual Activity   Alcohol use: No    Alcohol/week: 0.0 standard drinks of alcohol   Drug use: No   Sexual activity: Not Currently    Partners: Male

## 2023-02-27 DIAGNOSIS — M25561 Pain in right knee: Secondary | ICD-10-CM | POA: Diagnosis not present

## 2023-02-27 DIAGNOSIS — G629 Polyneuropathy, unspecified: Secondary | ICD-10-CM | POA: Diagnosis not present

## 2023-02-27 DIAGNOSIS — G894 Chronic pain syndrome: Secondary | ICD-10-CM | POA: Diagnosis not present

## 2023-02-28 DIAGNOSIS — F411 Generalized anxiety disorder: Secondary | ICD-10-CM | POA: Diagnosis not present

## 2023-02-28 DIAGNOSIS — F331 Major depressive disorder, recurrent, moderate: Secondary | ICD-10-CM | POA: Diagnosis not present

## 2023-03-05 DIAGNOSIS — J449 Chronic obstructive pulmonary disease, unspecified: Secondary | ICD-10-CM | POA: Diagnosis not present

## 2023-03-05 DIAGNOSIS — I10 Essential (primary) hypertension: Secondary | ICD-10-CM | POA: Diagnosis not present

## 2023-03-05 DIAGNOSIS — M6281 Muscle weakness (generalized): Secondary | ICD-10-CM | POA: Diagnosis not present

## 2023-03-05 DIAGNOSIS — F5101 Primary insomnia: Secondary | ICD-10-CM | POA: Diagnosis not present

## 2023-03-07 DIAGNOSIS — F331 Major depressive disorder, recurrent, moderate: Secondary | ICD-10-CM | POA: Diagnosis not present

## 2023-03-07 DIAGNOSIS — F411 Generalized anxiety disorder: Secondary | ICD-10-CM | POA: Diagnosis not present

## 2023-03-07 DIAGNOSIS — G894 Chronic pain syndrome: Secondary | ICD-10-CM | POA: Diagnosis not present

## 2023-03-07 DIAGNOSIS — G3184 Mild cognitive impairment, so stated: Secondary | ICD-10-CM | POA: Diagnosis not present

## 2023-03-10 ENCOUNTER — Emergency Department (HOSPITAL_COMMUNITY)
Admission: EM | Admit: 2023-03-10 | Discharge: 2023-03-11 | Disposition: A | Discharge status: Home / Self Care | Payer: Medicare Other | Attending: Emergency Medicine | Admitting: Emergency Medicine

## 2023-03-10 ENCOUNTER — Encounter (HOSPITAL_COMMUNITY): Payer: Self-pay

## 2023-03-10 ENCOUNTER — Other Ambulatory Visit: Payer: Self-pay

## 2023-03-10 ENCOUNTER — Emergency Department (HOSPITAL_COMMUNITY): Payer: Medicare Other

## 2023-03-10 DIAGNOSIS — S0990XA Unspecified injury of head, initial encounter: Secondary | ICD-10-CM | POA: Diagnosis not present

## 2023-03-10 DIAGNOSIS — M542 Cervicalgia: Secondary | ICD-10-CM | POA: Diagnosis not present

## 2023-03-10 DIAGNOSIS — R9431 Abnormal electrocardiogram [ECG] [EKG]: Secondary | ICD-10-CM | POA: Diagnosis not present

## 2023-03-10 DIAGNOSIS — R519 Headache, unspecified: Secondary | ICD-10-CM | POA: Diagnosis not present

## 2023-03-10 DIAGNOSIS — S0101XA Laceration without foreign body of scalp, initial encounter: Secondary | ICD-10-CM | POA: Insufficient documentation

## 2023-03-10 DIAGNOSIS — Z79899 Other long term (current) drug therapy: Secondary | ICD-10-CM | POA: Insufficient documentation

## 2023-03-10 DIAGNOSIS — R58 Hemorrhage, not elsewhere classified: Secondary | ICD-10-CM | POA: Diagnosis not present

## 2023-03-10 DIAGNOSIS — Y92129 Unspecified place in nursing home as the place of occurrence of the external cause: Secondary | ICD-10-CM | POA: Insufficient documentation

## 2023-03-10 DIAGNOSIS — W19XXXA Unspecified fall, initial encounter: Secondary | ICD-10-CM | POA: Insufficient documentation

## 2023-03-10 DIAGNOSIS — I10 Essential (primary) hypertension: Secondary | ICD-10-CM | POA: Insufficient documentation

## 2023-03-10 LAB — CBC
HCT: 37.6 % (ref 36.0–46.0)
Hemoglobin: 11.7 g/dL — ABNORMAL LOW (ref 12.0–15.0)
MCH: 28 pg (ref 26.0–34.0)
MCHC: 31.1 g/dL (ref 30.0–36.0)
MCV: 90 fL (ref 80.0–100.0)
Platelets: 223 10*3/uL (ref 150–400)
RBC: 4.18 MIL/uL (ref 3.87–5.11)
RDW: 14.1 % (ref 11.5–15.5)
WBC: 8.8 10*3/uL (ref 4.0–10.5)
nRBC: 0 % (ref 0.0–0.2)

## 2023-03-10 LAB — BASIC METABOLIC PANEL
Anion gap: 10 (ref 5–15)
BUN: 35 mg/dL — ABNORMAL HIGH (ref 8–23)
CO2: 25 mmol/L (ref 22–32)
Calcium: 9 mg/dL (ref 8.9–10.3)
Chloride: 103 mmol/L (ref 98–111)
Creatinine, Ser: 1.46 mg/dL — ABNORMAL HIGH (ref 0.44–1.00)
GFR, Estimated: 33 mL/min — ABNORMAL LOW (ref >60)
Glucose, Bld: 123 mg/dL — ABNORMAL HIGH (ref 70–99)
Potassium: 4.7 mmol/L (ref 3.5–5.1)
Sodium: 138 mmol/L (ref 135–145)

## 2023-03-10 MED ORDER — BACITRACIN ZINC 500 UNIT/GM EX OINT: TOPICAL_OINTMENT | Freq: Two times a day (BID) | CUTANEOUS | Status: DC

## 2023-03-10 MED ORDER — LIDOCAINE-EPINEPHRINE (PF) 2 %-1:200000 IJ SOLN: 20.0000 mL | Freq: Once | INTRAMUSCULAR | Status: AC

## 2023-03-10 MED ORDER — LIDOCAINE-EPINEPHRINE (PF) 2 %-1:200000 IJ SOLN
INTRAMUSCULAR | Status: AC
  Administered 2023-03-10: 20 mL via INTRADERMAL
  Filled 2023-03-10: qty 20

## 2023-03-10 MED ORDER — MUPIROCIN 2 % EX OINT: 1.0000 | TOPICAL_OINTMENT | Freq: Two times a day (BID) | CUTANEOUS | 0 refills | Status: DC

## 2023-03-10 NOTE — ED Triage Notes (Signed)
Pt to ED from [place], [place] via [facility] after unwitnessed fall. Pt states she did not experience LOC, states she was in her closet looking for pajamas and got dizzy and fell. Per EMS, pt on blood thinners, however, not on pta med list or list sent from facility. In C-collar on arrival, wound to top of head that is dressed and not bleeding at this time. Pt only c/o chronic generalized pain - arthritis related, more so in right knee.

## 2023-03-10 NOTE — ED Notes (Signed)
Cleaned up pt and applied bandage to head.

## 2023-03-10 NOTE — ED Notes (Signed)
Pt placed on convo list back to [place] [facility]

## 2023-03-10 NOTE — Discharge Instructions (Addendum)
He will need to have the staples taken out of your scalp in approximately 10 days.  There is no signs of bleeding on the brain or fractures of the skull or the spine.  If you develop severe redness pain swelling or fever or drainage from the wound return to the ER immediately otherwise you can follow-up with your doctor or nurse practitioner to have the staples removed.  I would recommend that you have someone with you as much as possible the next few days as you may have some confusion, you have had a concussion, this may cause some difficulty with balance as well.

## 2023-03-10 NOTE — ED Notes (Signed)
ED Provider at bedside. 

## 2023-03-10 NOTE — ED Provider Notes (Signed)
[place] EMERGENCY DEPARTMENT AT [facility] Provider Note   CSN: [id number] Arrival date & time: 03/10/23  2043     History  Chief Complaint  [name] presents with   [name] is a 89 y.o. female.   Fall   [name] is a 89 year old female, she is on alprazolam as needed, she takes diltiazem for heart rate and blood pressure, she is also on lisinopril, she is also on sertraline.  She presents to the hospital after having an accidental fall, evidently the [name] had been at her facility at [facility], she had an unwitnessed fall while she was in her closet, she was looking for pajamas when she got dizzy which she states occasionally happens when she looks down, she fell backward striking her head on the ground.  She is reportedly on blood thinners, bleeding controlled prehospital, placed in a cervical collar, she has no other complaints including no chest pain abdominal pain neck pain back pain arm pain or leg pain.    Home Medications Prior to Admission medications   Medication Sig Start Date End Date Taking? Authorizing Provider  mupirocin ointment (BACTROBAN) 2 % Apply 1 Application topically 2 (two) times daily. 03/10/23  Yes Eber Hong, MD  allopurinol (ZYLOPRIM) 100 MG tablet TAKE (1/2) TABLET BY MOUTH DAILY. 09/03/22   Babs Sciara, MD  ALPRAZolam Prudy Feeler) 0.25 MG tablet 1/2 tablet taken once daily as needed for anxiety 09/21/22   Babs Sciara, MD  carbamide peroxide (DEBROX) 6.5 % OTIC solution Place 5 drops into both ears 2 (two) times daily as needed (as needed for ear wax build up). 08/31/20   Bing Neighbors, NP  Cyanocobalamin (B-12) 1000 MCG TABS TAKE ONE TABLET BY MOUTHTONCE DAILY. [name] not taking: Reported on 02/16/2023 08/31/22   [provider]  DAILY MULTIPLE VITAMINS PO TAKE ONE TABLET BY MOUTHSONCE DAILY. 08/31/22   [provider]  diltiazem (CARDIZEM CD) 120 MG 24 hr capsule Take 1 capsule (120 mg total)  by mouth daily. 06/01/22   Babs Sciara, MD  estradiol (ESTRACE) 0.1 MG/GM vaginal cream Discard plastic applicator. Insert a blueberry size amount (approximately 1 gram) of cream on fingertip inside vagina at bedtime every night (for long term use). 01/18/23   Donnita Falls, FNP  gabapentin (NEURONTIN) 100 MG capsule 200 mg tid 02/16/23   Vickki Hearing, MD  HYDROcodone-acetaminophen (NORCO/VICODIN) 5-325 MG tablet One half or 1 tablet taken once daily as needed maximum in a days time is 1 pill 09/21/22   Babs Sciara, MD  ibuprofen (ADVIL) 800 MG tablet Take 1 tablet (800 mg total) by mouth every 8 (eight) hours as needed. 02/16/23   Vickki Hearing, MD  levothyroxine (SYNTHROID) 75 MCG tablet Take 1 tablet (75 mcg total) by mouth daily. 05/06/22   Babs Sciara, MD  lisinopril (ZESTRIL) 2.5 MG tablet Take 1 tablet (2.5 mg total) by mouth daily. 04/01/22   Babs Sciara, MD  melatonin 3 MG TABS tablet Take 3 mg by mouth at bedtime.    [provider]  Multiple Vitamin (MULTIVITAMIN WITH MINERALS) TABS tablet Take 1 tablet by mouth daily.    [provider]  omeprazole (PRILOSEC) 40 MG capsule Take 40 mg by mouth daily. [name] not taking: Reported on 02/16/2023 01/26/23   [provider]  ondansetron (ZOFRAN) 4 MG tablet Take 1 tablet (4 mg total) by mouth every 8 (eight) hours as needed for  nausea or vomiting. [name] not taking: Reported on 02/16/2023 12/17/21   Tommie Sams, DO  ondansetron (ZOFRAN-ODT) 4 MG disintegrating tablet Take 1 tablet (4 mg total) by mouth every 8 (eight) hours as needed for up to 10 doses for nausea or vomiting. [name] not taking: Reported on 02/16/2023 11/05/21   Cheryll Cockayne, MD  oxymetazoline (AFRIN) 0.05 % nasal spray Place 1 spray into both nostrils 2 (two) times daily. [name] not taking: Reported on 02/16/2023    [provider]  pantoprazole (PROTONIX) 40 MG tablet TAKE 1 TABLET BY MOUTH ONCE DAILY. [name] not  taking: Reported on 02/16/2023 07/04/22   Babs Sciara, MD  polyethylene glycol powder (GLYCOLAX/MIRALAX) 17 GM/SCOOP powder Take 17 g by mouth daily.    [provider]  potassium chloride (KLOR-CON) 10 MEQ tablet TAKE ONE TABLET BY MOUTH ONCE DAILY. [name] not taking: Reported on 02/16/2023 09/30/22   Babs Sciara, MD  potassium chloride SA (KLOR-CON M) 20 MEQ tablet Take 20 mEq by mouth daily. 01/26/23   [provider]  Propylene Glycol (SYSTANE COMPLETE OP) Apply 1 drop to eye daily as needed (dryness).    [provider]  QC ACETAMINOPHEN 8 HOURS 650 MG CR tablet Take 1 tablet by mouth in the morning and at bedtime. [name] not taking: Reported on 02/16/2023 10/23/19   [provider]  sertraline (ZOLOFT) 50 MG tablet Take 1 tablet (50 mg total) by mouth daily. 09/14/22   Babs Sciara, MD  torsemide (DEMADEX) 20 MG tablet TAKE (1) TABLET BY MOUTH EVERY OTHER DAY. 05/06/22   Babs Sciara, MD  trolamine salicylate (ASPERCREME) 10 % cream Apply 1 application topically as needed (feet and shoulders).    [provider]  vitamin B-12 1000 MCG tablet Take 1 tablet (1,000 mcg total) by mouth daily. [name] not taking: Reported on 02/16/2023 07/21/17   Beryl Meager, NP      Allergies    Amiodarone, Cephalosporins, Meloxicam, Robaxin [methocarbamol], Amoxicillin, Lovenox [enoxaparin sodium], Morphine, Morphine and codeine, Penicillins, Sulfa antibiotics, Lipitor [atorvastatin], Lorazepam, Macrobid [nitrofurantoin], and Metoprolol    Review of Systems   Review of Systems  All other systems reviewed and are negative.   Physical Exam Updated Vital Signs BP 127/80   Pulse 71   Temp 98 F (36.7 C) (Oral)   Resp 16   Ht 1.626 m (5\' 4" )   Wt 65.8 kg   SpO2 92%   BMI 24.89 kg/m  Physical Exam Vitals and nursing note reviewed.  Constitutional:      General: She is not in acute distress.    Appearance: She is well-developed.  HENT:      Head: Normocephalic.     Comments: 5 cm laceration to the crown, no hematoma    Mouth/Throat:     Pharynx: No oropharyngeal exudate.  Eyes:     General: No scleral icterus.       Right eye: No discharge.        Left eye: No discharge.     Conjunctiva/sclera: Conjunctivae normal.     Pupils: Pupils are equal, round, and reactive to light.  Neck:     Thyroid: No thyromegaly.     Vascular: No JVD.  Cardiovascular:     Rate and Rhythm: Normal rate and regular rhythm.     Heart sounds: Normal heart sounds. No murmur heard.    No friction rub. No gallop.  Pulmonary:     Effort: Pulmonary effort is  normal. No respiratory distress.     Breath sounds: Normal breath sounds. No wheezing or rales.  Abdominal:     General: Bowel sounds are normal. There is no distension.     Palpations: Abdomen is soft. There is no mass.     Tenderness: There is no abdominal tenderness.  Musculoskeletal:        General: No tenderness. Normal range of motion.     Cervical back: Normal range of motion and neck supple.     Comments: Diffusely supple joints and soft compartments, she has the ability to follow commands without difficulty, normal level of alertness, cranial nerves III through XII are normal, no tenderness over the cervical thoracic or lumbar spines.  Lymphadenopathy:     Cervical: No cervical adenopathy.  Skin:    General: Skin is warm and dry.     Findings: No erythema or rash.  Neurological:     Mental Status: She is alert.     Coordination: Coordination normal.  Psychiatric:        Behavior: Behavior normal.     ED Results / Procedures / Treatments   Labs (all labs ordered are listed, but only abnormal results are displayed) Labs Reviewed  CBC - Abnormal; Notable for the following components:      Result Value   Hemoglobin 11.7 (*)    All other components within normal limits  BASIC METABOLIC PANEL - Abnormal; Notable for the following components:   Glucose, Bld 123 (*)    BUN 35  (*)    Creatinine, Ser 1.46 (*)    GFR, Estimated 33 (*)    All other components within normal limits    EKG EKG Interpretation Date/Time:  Friday March 10 2023 21:52:11 EDT Ventricular Rate:  74 PR Interval:  242 QRS Duration:  94 QT Interval:  400 QTC Calculation: 444 R Axis:   9  Text Interpretation: Sinus rhythm Prolonged PR interval Confirmed by Eber Hong ([id number]) on 03/10/2023 10:18:42 PM  Radiology CT Head Wo Contrast  Result Date: 03/10/2023 CLINICAL DATA:  Un witnessed fall with headaches and neck pain, initial encounter EXAM: CT HEAD WITHOUT CONTRAST CT CERVICAL SPINE WITHOUT CONTRAST TECHNIQUE: Multidetector CT imaging of the head and cervical spine was performed following the standard protocol without intravenous contrast. Multiplanar CT image reconstructions of the cervical spine were also generated. RADIATION DOSE REDUCTION: Katherine exam was performed according to the departmental dose-optimization program which includes automated exposure control, adjustment of the mA and/or kV according to [name] size and/or use of iterative reconstruction technique. COMPARISON:  08/01/2022 FINDINGS: CT HEAD FINDINGS Brain: No evidence of acute infarction, hemorrhage, hydrocephalus, extra-axial collection or mass lesion/mass effect. Vascular: No hyperdense vessel or unexpected calcification. Skull: Normal. Negative for fracture or focal lesion. Sinuses/Orbits: No acute finding. Other: None. CT CERVICAL SPINE FINDINGS Alignment: Straightening of the normal cervical lordosis is noted. Skull base and vertebrae: 7 cervical segments are well visualized. Vertebral body height is well maintained. Multilevel osteophytic change and facet hypertrophic changes noted. No acute fracture or acute facet abnormality is noted. Soft tissues and spinal canal: Surrounding soft tissue structures are within normal limits. Upper chest: Visualized lung apices show no acute abnormality. Other: None IMPRESSION: CT of  the head: No intracranial abnormality noted. CT of cervical spine: Degenerative change without acute abnormality. Electronically Signed   By: Alcide Clever M.D.   On: 03/10/2023 22:41   CT Cervical Spine Wo Contrast  Result Date: 03/10/2023 CLINICAL DATA:  Un witnessed fall  with headaches and neck pain, initial encounter EXAM: CT HEAD WITHOUT CONTRAST CT CERVICAL SPINE WITHOUT CONTRAST TECHNIQUE: Multidetector CT imaging of the head and cervical spine was performed following the standard protocol without intravenous contrast. Multiplanar CT image reconstructions of the cervical spine were also generated. RADIATION DOSE REDUCTION: Katherine exam was performed according to the departmental dose-optimization program which includes automated exposure control, adjustment of the mA and/or kV according to [name] size and/or use of iterative reconstruction technique. COMPARISON:  08/01/2022 FINDINGS: CT HEAD FINDINGS Brain: No evidence of acute infarction, hemorrhage, hydrocephalus, extra-axial collection or mass lesion/mass effect. Vascular: No hyperdense vessel or unexpected calcification. Skull: Normal. Negative for fracture or focal lesion. Sinuses/Orbits: No acute finding. Other: None. CT CERVICAL SPINE FINDINGS Alignment: Straightening of the normal cervical lordosis is noted. Skull base and vertebrae: 7 cervical segments are well visualized. Vertebral body height is well maintained. Multilevel osteophytic change and facet hypertrophic changes noted. No acute fracture or acute facet abnormality is noted. Soft tissues and spinal canal: Surrounding soft tissue structures are within normal limits. Upper chest: Visualized lung apices show no acute abnormality. Other: None IMPRESSION: CT of the head: No intracranial abnormality noted. CT of cervical spine: Degenerative change without acute abnormality. Electronically Signed   By: Alcide Clever M.D.   On: 03/10/2023 22:41    Procedures .Marland KitchenLaceration Repair  Date/Time:  03/10/2023 11:47 PM  Performed by: Eber Hong, MD Authorized by: Eber Hong, MD   Consent:    Consent obtained:  Verbal   Consent given by:  [name]   Risks, benefits, and alternatives were discussed: yes     Risks discussed:  Infection, need for additional repair, nerve damage, poor wound healing, poor cosmetic result and pain   Alternatives discussed:  No treatment Universal protocol:    Procedure explained and questions answered to [name] or proxy's satisfaction: yes     Immediately prior to procedure, a time out was called: yes     [name] identity confirmed:  Provided demographic data and verbally with [name] Anesthesia:    Anesthesia method:  Local infiltration   Local anesthetic:  Lidocaine 1% WITH epi Laceration details:    Location:  Scalp   Length (cm):  5   Depth (mm):  4 Pre-procedure details:    Preparation:  [name] was prepped and draped in usual sterile fashion and imaging obtained to evaluate for foreign bodies Exploration:    Limited defect created (wound extended): no     Hemostasis achieved with:  Direct pressure   Imaging obtained: x-ray     Imaging outcome: foreign body not noted     Wound exploration: wound explored through full range of motion and entire depth of wound visualized     Contaminated: no   Treatment:    Area cleansed with:  Saline   Amount of cleaning:  Extensive   Irrigation solution:  Sterile saline   Debridement:  None Skin repair:    Repair method:  Staples   Number of staples:  4 Approximation:    Approximation:  Close Repair type:    Repair type:  Simple Post-procedure details:    Dressing:  Antibiotic ointment   Procedure completion:  Tolerated well, no immediate complications Comments:           Medications Ordered in ED Medications  lidocaine-EPINEPHrine (XYLOCAINE W/EPI) 2 %-1:200000 (PF) injection 20 mL (20 mLs Intradermal Given by Other 03/10/23 2344)    ED Course/ Medical Decision Making/ A&P  Medical Decision Making Amount and/or Complexity of Data Reviewed Labs: ordered. Radiology: ordered. ECG/medicine tests: ordered.  Risk Prescription drug management.    [name] presents to the ED for concern of head injury after fall, differential diagnosis includes dizziness including vertigo, she is not dizzy at Katherine time, she has no arrhythmias on the monitor, her exam is unremarkable, she does need a laceration close, CT imaging will be obtained    Additional history obtained:  Additional history obtained from medical record External records from outside source obtained and reviewed including prior workups, prior office visits, had been seen in the emergency department in March, has not been admitted to the hospital in over a year, in fact her last admission was in March 2023.  She had been history of GI bleed, history of atrial for been flutter, she had a small bowel obstruction at that point. No complaints of abdominal pain today   Lab Tests:  I Ordered, and personally interpreted labs.  The pertinent results include: CBC and metabolic panel unremarkable   Imaging Studies ordered:  I ordered imaging studies including CT scan of the brain and cervical spine I independently visualized and interpreted imaging which showed no acute bleeding or fractures I agree with the radiologist interpretation   Medicines ordered and prescription drug management:  I ordered medication including lidocaine for infiltration of the wound and bacitracin for the wound, dressing for the wound Reevaluation of the [name] after these medicines showed that the [name] stable I have reviewed the patients home medicines and have made adjustments as needed   Problem List / ED Course:  The granddaughter came to see the [name], she feels like she would be best served by having some increasing care at her facility which I totally agree with given her advanced age, she is  contacted the facility and they will be able to help facilitate Katherine over the next few days.  I am comfortable letting the [name] go home and the circumstances, no vomiting, negative workup   Social Determinants of Health:  89 years old, from nursing facility, stable for discharge         Final Clinical Impression(s) / ED Diagnoses Final diagnoses:  Laceration of scalp, initial encounter    Rx / DC Orders ED Discharge Orders          Ordered    mupirocin ointment (BACTROBAN) 2 %  2 times daily        03/10/23 2346              Eber Hong, MD 03/10/23 2349

## 2023-03-11 DIAGNOSIS — I10 Essential (primary) hypertension: Secondary | ICD-10-CM | POA: Diagnosis not present

## 2023-03-11 DIAGNOSIS — R531 Weakness: Secondary | ICD-10-CM | POA: Diagnosis not present

## 2023-03-11 DIAGNOSIS — Z743 Need for continuous supervision: Secondary | ICD-10-CM | POA: Diagnosis not present

## 2023-03-11 NOTE — ED Notes (Signed)
Pt's granddaughter, Tamera Punt can be reached at [phone] as needed.

## 2023-03-11 NOTE — ED Notes (Signed)
Report called to [name]. Pt remains in ED awaiting transport.

## 2023-03-13 DIAGNOSIS — S0101XA Laceration without foreign body of scalp, initial encounter: Secondary | ICD-10-CM | POA: Diagnosis not present

## 2023-03-13 DIAGNOSIS — R42 Dizziness and giddiness: Secondary | ICD-10-CM | POA: Diagnosis not present

## 2023-03-13 DIAGNOSIS — I482 Chronic atrial fibrillation, unspecified: Secondary | ICD-10-CM | POA: Diagnosis not present

## 2023-03-13 DIAGNOSIS — M25561 Pain in right knee: Secondary | ICD-10-CM | POA: Diagnosis not present

## 2023-03-14 ENCOUNTER — Emergency Department (HOSPITAL_COMMUNITY): Payer: Medicare Other

## 2023-03-14 ENCOUNTER — Emergency Department (HOSPITAL_COMMUNITY)
Admission: EM | Admit: 2023-03-14 | Discharge: 2023-03-14 | Disposition: A | Discharge status: Home / Self Care | Payer: Medicare Other | Attending: Student | Admitting: Student

## 2023-03-14 ENCOUNTER — Other Ambulatory Visit: Payer: Self-pay

## 2023-03-14 DIAGNOSIS — S199XXA Unspecified injury of neck, initial encounter: Secondary | ICD-10-CM | POA: Diagnosis not present

## 2023-03-14 DIAGNOSIS — E785 Hyperlipidemia, unspecified: Secondary | ICD-10-CM | POA: Diagnosis not present

## 2023-03-14 DIAGNOSIS — W19XXXA Unspecified fall, initial encounter: Secondary | ICD-10-CM

## 2023-03-14 DIAGNOSIS — I129 Hypertensive chronic kidney disease with stage 1 through stage 4 chronic kidney disease, or unspecified chronic kidney disease: Secondary | ICD-10-CM | POA: Insufficient documentation

## 2023-03-14 DIAGNOSIS — N1832 Chronic kidney disease, stage 3b: Secondary | ICD-10-CM | POA: Diagnosis not present

## 2023-03-14 DIAGNOSIS — M1611 Unilateral primary osteoarthritis, right hip: Secondary | ICD-10-CM | POA: Diagnosis not present

## 2023-03-14 DIAGNOSIS — I4891 Unspecified atrial fibrillation: Secondary | ICD-10-CM | POA: Insufficient documentation

## 2023-03-14 DIAGNOSIS — S0990XA Unspecified injury of head, initial encounter: Secondary | ICD-10-CM | POA: Diagnosis not present

## 2023-03-14 DIAGNOSIS — S0003XA Contusion of scalp, initial encounter: Secondary | ICD-10-CM | POA: Diagnosis not present

## 2023-03-14 DIAGNOSIS — M545 Low back pain, unspecified: Secondary | ICD-10-CM | POA: Diagnosis not present

## 2023-03-14 DIAGNOSIS — M48061 Spinal stenosis, lumbar region without neurogenic claudication: Secondary | ICD-10-CM | POA: Diagnosis not present

## 2023-03-14 DIAGNOSIS — Z853 Personal history of malignant neoplasm of breast: Secondary | ICD-10-CM | POA: Insufficient documentation

## 2023-03-14 DIAGNOSIS — M25551 Pain in right hip: Secondary | ICD-10-CM | POA: Diagnosis not present

## 2023-03-14 DIAGNOSIS — R519 Headache, unspecified: Secondary | ICD-10-CM | POA: Diagnosis not present

## 2023-03-14 DIAGNOSIS — F331 Major depressive disorder, recurrent, moderate: Secondary | ICD-10-CM | POA: Diagnosis not present

## 2023-03-14 DIAGNOSIS — F411 Generalized anxiety disorder: Secondary | ICD-10-CM | POA: Diagnosis not present

## 2023-03-14 DIAGNOSIS — G319 Degenerative disease of nervous system, unspecified: Secondary | ICD-10-CM | POA: Diagnosis not present

## 2023-03-14 DIAGNOSIS — Z4802 Encounter for removal of sutures: Secondary | ICD-10-CM | POA: Diagnosis not present

## 2023-03-14 DIAGNOSIS — M47816 Spondylosis without myelopathy or radiculopathy, lumbar region: Secondary | ICD-10-CM | POA: Diagnosis not present

## 2023-03-14 DIAGNOSIS — M1711 Unilateral primary osteoarthritis, right knee: Secondary | ICD-10-CM | POA: Diagnosis not present

## 2023-03-14 DIAGNOSIS — Z7901 Long term (current) use of anticoagulants: Secondary | ICD-10-CM | POA: Diagnosis not present

## 2023-03-14 DIAGNOSIS — M25561 Pain in right knee: Secondary | ICD-10-CM | POA: Diagnosis not present

## 2023-03-14 DIAGNOSIS — Z79899 Other long term (current) drug therapy: Secondary | ICD-10-CM | POA: Insufficient documentation

## 2023-03-14 DIAGNOSIS — I10 Essential (primary) hypertension: Secondary | ICD-10-CM | POA: Insufficient documentation

## 2023-03-14 DIAGNOSIS — I700 Atherosclerosis of aorta: Secondary | ICD-10-CM | POA: Diagnosis not present

## 2023-03-14 MED ORDER — HYDROCODONE-ACETAMINOPHEN 5-325 MG PO TABS
1.0000 | ORAL_TABLET | Freq: Once | ORAL | Status: AC
  Administered 2023-03-14: 1 via ORAL
  Filled 2023-03-14: qty 1

## 2023-03-14 NOTE — ED Notes (Signed)
Pt granddaughter called for update, informed her of pts disposition. Granddaughter informed this RN that she would be arranging transport to get pt back to [place].

## 2023-03-14 NOTE — ED Notes (Signed)
Patient transported to CT 

## 2023-03-14 NOTE — ED Notes (Signed)
Pt back from imaging and family member at bedside

## 2023-03-14 NOTE — ED Notes (Signed)
Pt given a warm blanket 

## 2023-03-14 NOTE — ED Triage Notes (Signed)
Pt BIB [facility], EMS called to [facility] for patient fall. Patient takes eliquis. Patient hit her head denise pain from this fall but has right behind the knee pain from previous fall.

## 2023-03-14 NOTE — ED Notes (Signed)
[facility] cancelled. Grandson taking patient back to [place] [facility]

## 2023-03-14 NOTE — ED Notes (Signed)
Patient transported to X-ray 

## 2023-03-14 NOTE — ED Provider Notes (Signed)
[place] [facility]PARTMENT AT [facility] Provider Note  CSN: [id number] Arrival date & time: 03/14/23 1524  Chief Complaint(s) Fall  HPI [name] is a 89 y.o. female with PMH A-fib reportedly on Eliquis, HTN, HLD, osteoporosis who presents emergency room for evaluation of a fall.  Patient states that she was leaning back to sit in her chair, missed the chair and fell onto her bottom.  She states that she did strike her head.  Currently endorsing headache, right hip and knee pain.  Denies chest pain, shortness of breath, visual disturbance, numbness, tingling, weakness or other systemic, traumatic or neurologic complaints.   Past Medical History Past Medical History:  Diagnosis Date   Abdominal pain, chronic, right lower quadrant    "ever since I had my appendix removed"   Anxiety    Atrial fibrillation (HCC)    Breast cancer (HCC)    Cancer (HCC)    left side   Chronic back pain    Edema    GERD (gastroesophageal reflux disease)    Hyperlipidemia    Hypertension    Osteopenia    Osteoporosis    Ovarian cyst 02/2015   left   Recurrent abdominal pain    LLQ   Patient Active Problem List   Diagnosis Date Noted   Recurrent UTI 01/16/2023   Renal cyst 01/16/2023   Paroxysmal atrial fibrillation (HCC) 09/25/2022   Hypertension 09/25/2022   Stage 3b chronic kidney disease (HCC) 06/16/2022   Head trauma 12/16/2021   Acute gout due to renal impairment involving foot 08/09/2021   Nausea and vomiting 11/07/2019   Right hip pain 08/01/2019   HLD (hyperlipidemia) 07/18/2017   B12 deficiency 07/18/2017   SAH (subarachnoid hemorrhage) (HCC) 07/17/2017   ICH (intracerebral hemorrhage) (HCC) 07/16/2017   Cervical disc disease 05/09/2017   Hypothyroid 05/09/2017   Osteoarthritis of right knee 12/15/2015   Major depression 09/22/2015   Ventral hernia 05/21/2015   Ovarian tumor 04/13/2015   Squamous cell skin cancer 08/13/2014   Impaired fasting glucose  08/05/2013   Neuropathy 01/11/2013   Anxiety 01/29/2012   GERD (gastroesophageal reflux disease) 01/29/2012   Sciatica of right side 05/10/2011   Home Medication(s) Prior to Admission medications   Medication Sig Start Date End Date Taking? Authorizing Provider  allopurinol (ZYLOPRIM) 100 MG tablet TAKE (1/2) TABLET BY MOUTH DAILY. 09/03/22   Babs Sciara, MD  ALPRAZolam Prudy Feeler) 0.25 MG tablet 1/2 tablet taken once daily as needed for anxiety 09/21/22   Babs Sciara, MD  carbamide peroxide (DEBROX) 6.5 % OTIC solution Place 5 drops into both ears 2 (two) times daily as needed (as needed for ear wax build up). 08/31/20   Bing Neighbors, NP  Cyanocobalamin (B-12) 1000 MCG TABS TAKE ONE TABLET BY MOUTHTONCE DAILY. Patient not taking: Reported on 02/16/2023 08/31/22   [provider]  DAILY MULTIPLE VITAMINS PO TAKE ONE TABLET BY MOUTHSONCE DAILY. 08/31/22   [provider]  diltiazem (CARDIZEM CD) 120 MG 24 hr capsule Take 1 capsule (120 mg total) by mouth daily. 06/01/22   Babs Sciara, MD  estradiol (ESTRACE) 0.1 MG/GM vaginal cream Discard plastic applicator. Insert a blueberry size amount (approximately 1 gram) of cream on fingertip inside vagina at bedtime every night (for long term use). 01/18/23   Donnita Falls, FNP  gabapentin (NEURONTIN) 100 MG capsule 200 mg tid 02/16/23   Vickki Hearing, MD  HYDROcodone-acetaminophen (NORCO/VICODIN) 5-325 MG tablet One half or 1 tablet  taken once daily as needed maximum in a days time is 1 pill 09/21/22   Babs Sciara, MD  ibuprofen (ADVIL) 800 MG tablet Take 1 tablet (800 mg total) by mouth every 8 (eight) hours as needed. 02/16/23   Vickki Hearing, MD  levothyroxine (SYNTHROID) 75 MCG tablet Take 1 tablet (75 mcg total) by mouth daily. 05/06/22   Babs Sciara, MD  lisinopril (ZESTRIL) 2.5 MG tablet Take 1 tablet (2.5 mg total) by mouth daily. 04/01/22   Babs Sciara, MD  melatonin 3 MG TABS tablet Take 3 mg by  mouth at bedtime.    [provider]  Multiple Vitamin (MULTIVITAMIN WITH MINERALS) TABS tablet Take 1 tablet by mouth daily.    [provider]  mupirocin ointment (BACTROBAN) 2 % Apply 1 Application topically 2 (two) times daily. 03/10/23   Eber Hong, MD  omeprazole (PRILOSEC) 40 MG capsule Take 40 mg by mouth daily. Patient not taking: Reported on 02/16/2023 01/26/23   [provider]  ondansetron (ZOFRAN) 4 MG tablet Take 1 tablet (4 mg total) by mouth every 8 (eight) hours as needed for nausea or vomiting. Patient not taking: Reported on 02/16/2023 12/17/21   Tommie Sams, DO  ondansetron (ZOFRAN-ODT) 4 MG disintegrating tablet Take 1 tablet (4 mg total) by mouth every 8 (eight) hours as needed for up to 10 doses for nausea or vomiting. Patient not taking: Reported on 02/16/2023 11/05/21   Cheryll Cockayne, MD  oxymetazoline (AFRIN) 0.05 % nasal spray Place 1 spray into both nostrils 2 (two) times daily. Patient not taking: Reported on 02/16/2023    [provider]  pantoprazole (PROTONIX) 40 MG tablet TAKE 1 TABLET BY MOUTH ONCE DAILY. Patient not taking: Reported on 02/16/2023 07/04/22   Babs Sciara, MD  polyethylene glycol powder (GLYCOLAX/MIRALAX) 17 GM/SCOOP powder Take 17 g by mouth daily.    [provider]  potassium chloride (KLOR-CON) 10 MEQ tablet TAKE ONE TABLET BY MOUTH ONCE DAILY. Patient not taking: Reported on 02/16/2023 09/30/22   Babs Sciara, MD  potassium chloride SA (KLOR-CON M) 20 MEQ tablet Take 20 mEq by mouth daily. 01/26/23   [provider]  Propylene Glycol (SYSTANE COMPLETE OP) Apply 1 drop to eye daily as needed (dryness).    [provider]  QC ACETAMINOPHEN 8 HOURS 650 MG CR tablet Take 1 tablet by mouth in the morning and at bedtime. Patient not taking: Reported on 02/16/2023 10/23/19   [provider]  sertraline (ZOLOFT) 50 MG tablet Take 1 tablet (50 mg total) by mouth daily. 09/14/22    Babs Sciara, MD  torsemide (DEMADEX) 20 MG tablet TAKE (1) TABLET BY MOUTH EVERY OTHER DAY. 05/06/22   Babs Sciara, MD  trolamine salicylate (ASPERCREME) 10 % cream Apply 1 application topically as needed (feet and shoulders).    [provider]  vitamin B-12 1000 MCG tablet Take 1 tablet (1,000 mcg total) by mouth daily. Patient not taking: Reported on 02/16/2023 07/21/17   Beryl Meager, NP  Past Surgical History Past Surgical History:  Procedure Laterality Date   APPENDECTOMY     APPENDECTOMY     BREAST SURGERY Left    lumpectomy   CARDIOVERSION N/A 03/31/2016   Procedure: CARDIOVERSION;  Surgeon: Wendall Stade, MD;  Location: [facility];  Service: Cardiovascular;  Laterality: N/A;   COLON RESECTION     COLON SURGERY     HERNIA REPAIR     HERNIA REPAIR     Family History Family History  Problem Relation Age of Onset   Diabetes Mother    Heart disease Other    Arthritis Other    Cancer Other    Hyperlipidemia Brother     Social History Social History   Tobacco Use   Smoking status: Never    Passive exposure: Never   Smokeless tobacco: Never  Vaping Use   Vaping status: Never Used  Substance Use Topics   Alcohol use: No    Alcohol/week: 0.0 standard drinks of alcohol   Drug use: No   Allergies Amiodarone, Cephalosporins, Meloxicam, Robaxin [methocarbamol], Amoxicillin, Lovenox [enoxaparin sodium], Morphine, Morphine and codeine, Penicillins, Sulfa antibiotics, Lipitor [atorvastatin], Lorazepam, Macrobid [nitrofurantoin], and Metoprolol  Review of Systems Review of Systems  Musculoskeletal:  Positive for arthralgias, back pain and myalgias.  Neurological:  Positive for headaches.    Physical Exam Vital Signs  I have reviewed the triage vital signs BP (!) 130/55 (BP Location: Right Arm)   Pulse 93   Temp 99.4 F (37.4  C) (Oral)   SpO2 94%   Physical Exam Vitals and nursing note reviewed.  Constitutional:      General: She is not in acute distress.    Appearance: She is well-developed.  HENT:     Head: Normocephalic.     Comments: Hematoma to posterior occiput Eyes:     Conjunctiva/sclera: Conjunctivae normal.  Cardiovascular:     Rate and Rhythm: Normal rate and regular rhythm.     Heart sounds: No murmur heard. Pulmonary:     Effort: Pulmonary effort is normal. No respiratory distress.     Breath sounds: Normal breath sounds.  Abdominal:     Palpations: Abdomen is soft.     Tenderness: There is no abdominal tenderness.  Musculoskeletal:        General: Tenderness present. No swelling.     Cervical back: Neck supple.  Skin:    General: Skin is warm and dry.     Capillary Refill: Capillary refill takes less than 2 seconds.  Neurological:     Mental Status: She is alert.  Psychiatric:        Mood and Affect: Mood normal.     ED Results and Treatments Labs (all labs ordered are listed, but only abnormal results are displayed) Labs Reviewed - No data to display  Radiology No results found.  Pertinent labs & imaging results that were available during my care of the patient were reviewed by me and considered in my medical decision making (see MDM for details).  Medications Ordered in ED Medications  HYDROcodone-acetaminophen (NORCO/VICODIN) 5-325 MG per tablet 1 tablet (has no administration in time range)                                                                                                                                     Procedures .Suture Removal  Date/Time: 03/14/2023 3:59 PM  Performed by: Glendora Score, MD Authorized by: Glendora Score, MD   Location:    Location:  Head/neck   Head/neck location:  Scalp Procedure details:    Wound  appearance:  No signs of infection and good wound healing   Number of staples removed:  4 Post-procedure details:    Post-removal:  No dressing applied   Procedure completion:  Tolerated well, no immediate complications   (including critical care time)  Medical Decision Making / ED Course   This patient presents to the ED for concern of fall, this involves an extensive number of treatment options, and is a complaint that carries with it a high risk of complications and morbidity.  The differential diagnosis includes fracture, contusion, hematoma, ligamentous injury, closed head injury, ICH, laceration, intrathoracic injury, intra-abdominal injury  MDM: Patient seen emergency room for evaluation of a fall.  Physical exam reveals staples in place over the posterior occiput from previous fall, small occipital hematoma, tenderness over the right hip, femur and knee.  No external signs of trauma over the chest abdomen pelvis.  No tenderness to palpation over these areas either.  Trauma imaging including CT head, C-spine, L-spine, x-ray of the hip, femur and knee reassuringly negative for traumatic injury.  CT L-spine showing spinal stenosis but patient's neurologic exam is not indicative of cauda equina at this time.  Thus further evaluation with MRI not pursued.  At this time with negative trauma workup she does not meet inpatient criteria for admission and is safe for discharge back to her facility.  Staples removed at bedside.  Patient discharged. given return precautions of which she voiced understanding.   Additional history obtained: -Additional history obtained from daughter -External records from outside source obtained and reviewed including: Chart review including previous notes, labs, imaging, consultation notes      Imaging Studies ordered: I ordered imaging studies including CT head, C-spine, L-spine, hip x-ray, knee x-ray, femur x-ray I independently visualized and interpreted  imaging. I agree with the radiologist interpretation   Medicines ordered and prescription drug management: Meds ordered this encounter  Medications   HYDROcodone-acetaminophen (NORCO/VICODIN) 5-325 MG per tablet 1 tablet    -I have reviewed the patients home medicines and have made adjustments as needed  Critical interventions none    Social Determinants of Health:  Factors impacting patients care include: Lives in  the facility   Reevaluation: After the interventions noted above, I reevaluated the patient and found that they have :improved  Co morbidities that complicate the patient evaluation  Past Medical History:  Diagnosis Date   Abdominal pain, chronic, right lower quadrant    "ever since I had my appendix removed"   Anxiety    Atrial fibrillation (HCC)    Breast cancer (HCC)    Cancer (HCC)    left side   Chronic back pain    Edema    GERD (gastroesophageal reflux disease)    Hyperlipidemia    Hypertension    Osteopenia    Osteoporosis    Ovarian cyst 02/2015   left   Recurrent abdominal pain    LLQ      Dispostion: I considered admission for this patient, but at this time she does not meet inpatient criteria for admission and she is safe for discharge with outpatient follow-up     Final Clinical Impression(s) / ED Diagnoses Final diagnoses:  None     @PCDICTATION @    Glendora Score, MD 03/15/23 1156

## 2023-03-14 NOTE — ED Notes (Signed)
Patient assisted by Tech to [facility] via wheelchair.

## 2023-03-14 NOTE — ED Notes (Signed)
Pt being taken back to facility by grandson

## 2023-03-14 NOTE — ED Notes (Signed)
Pts granddaughter,, Elmer Bales states that she has to leave to pick up her kids, also states that she would like to be called at [phone], by the EDP pertaining to the pts X ray and CT results. Pt consents for results to be given to [facility]

## 2023-03-15 DIAGNOSIS — N390 Urinary tract infection, site not specified: Secondary | ICD-10-CM | POA: Diagnosis not present

## 2023-03-16 ENCOUNTER — Ambulatory Visit: Payer: Medicare Other | Admitting: Family Medicine

## 2023-03-20 DIAGNOSIS — R296 Repeated falls: Secondary | ICD-10-CM | POA: Diagnosis not present

## 2023-03-20 DIAGNOSIS — F339 Major depressive disorder, recurrent, unspecified: Secondary | ICD-10-CM | POA: Diagnosis not present

## 2023-03-20 DIAGNOSIS — E1143 Type 2 diabetes mellitus with diabetic autonomic (poly)neuropathy: Secondary | ICD-10-CM | POA: Diagnosis not present

## 2023-03-20 DIAGNOSIS — N390 Urinary tract infection, site not specified: Secondary | ICD-10-CM | POA: Diagnosis not present

## 2023-03-21 DIAGNOSIS — F331 Major depressive disorder, recurrent, moderate: Secondary | ICD-10-CM | POA: Diagnosis not present

## 2023-03-21 DIAGNOSIS — F411 Generalized anxiety disorder: Secondary | ICD-10-CM | POA: Diagnosis not present

## 2023-03-23 DIAGNOSIS — R296 Repeated falls: Secondary | ICD-10-CM | POA: Diagnosis not present

## 2023-03-23 DIAGNOSIS — M5431 Sciatica, right side: Secondary | ICD-10-CM | POA: Diagnosis not present

## 2023-03-23 DIAGNOSIS — M109 Gout, unspecified: Secondary | ICD-10-CM | POA: Diagnosis not present

## 2023-03-23 DIAGNOSIS — M810 Age-related osteoporosis without current pathological fracture: Secondary | ICD-10-CM | POA: Diagnosis not present

## 2023-03-23 DIAGNOSIS — I480 Paroxysmal atrial fibrillation: Secondary | ICD-10-CM | POA: Diagnosis not present

## 2023-03-23 DIAGNOSIS — Z556 Problems related to health literacy: Secondary | ICD-10-CM | POA: Diagnosis not present

## 2023-03-23 DIAGNOSIS — E039 Hypothyroidism, unspecified: Secondary | ICD-10-CM | POA: Diagnosis not present

## 2023-03-23 DIAGNOSIS — E1143 Type 2 diabetes mellitus with diabetic autonomic (poly)neuropathy: Secondary | ICD-10-CM | POA: Diagnosis not present

## 2023-03-23 DIAGNOSIS — I129 Hypertensive chronic kidney disease with stage 1 through stage 4 chronic kidney disease, or unspecified chronic kidney disease: Secondary | ICD-10-CM | POA: Diagnosis not present

## 2023-03-23 DIAGNOSIS — G894 Chronic pain syndrome: Secondary | ICD-10-CM | POA: Diagnosis not present

## 2023-03-23 DIAGNOSIS — R41841 Cognitive communication deficit: Secondary | ICD-10-CM | POA: Diagnosis not present

## 2023-03-23 DIAGNOSIS — J449 Chronic obstructive pulmonary disease, unspecified: Secondary | ICD-10-CM | POA: Diagnosis not present

## 2023-03-23 DIAGNOSIS — K219 Gastro-esophageal reflux disease without esophagitis: Secondary | ICD-10-CM | POA: Diagnosis not present

## 2023-03-23 DIAGNOSIS — S0083XD Contusion of other part of head, subsequent encounter: Secondary | ICD-10-CM | POA: Diagnosis not present

## 2023-03-23 DIAGNOSIS — E538 Deficiency of other specified B group vitamins: Secondary | ICD-10-CM | POA: Diagnosis not present

## 2023-03-23 DIAGNOSIS — K080 Exfoliation of teeth due to systemic causes: Secondary | ICD-10-CM | POA: Diagnosis not present

## 2023-03-23 DIAGNOSIS — F419 Anxiety disorder, unspecified: Secondary | ICD-10-CM | POA: Diagnosis not present

## 2023-03-23 DIAGNOSIS — N390 Urinary tract infection, site not specified: Secondary | ICD-10-CM | POA: Diagnosis not present

## 2023-03-23 DIAGNOSIS — F329 Major depressive disorder, single episode, unspecified: Secondary | ICD-10-CM | POA: Diagnosis not present

## 2023-03-23 DIAGNOSIS — Z7952 Long term (current) use of systemic steroids: Secondary | ICD-10-CM | POA: Diagnosis not present

## 2023-03-23 DIAGNOSIS — N1832 Chronic kidney disease, stage 3b: Secondary | ICD-10-CM | POA: Diagnosis not present

## 2023-03-23 DIAGNOSIS — E1122 Type 2 diabetes mellitus with diabetic chronic kidney disease: Secondary | ICD-10-CM | POA: Diagnosis not present

## 2023-03-23 DIAGNOSIS — E785 Hyperlipidemia, unspecified: Secondary | ICD-10-CM | POA: Diagnosis not present

## 2023-03-23 DIAGNOSIS — Z9181 History of falling: Secondary | ICD-10-CM | POA: Diagnosis not present

## 2023-03-23 DIAGNOSIS — M4800 Spinal stenosis, site unspecified: Secondary | ICD-10-CM | POA: Diagnosis not present

## 2023-03-23 DIAGNOSIS — Z853 Personal history of malignant neoplasm of breast: Secondary | ICD-10-CM | POA: Diagnosis not present

## 2023-03-24 DIAGNOSIS — M25561 Pain in right knee: Secondary | ICD-10-CM | POA: Diagnosis not present

## 2023-03-24 DIAGNOSIS — K219 Gastro-esophageal reflux disease without esophagitis: Secondary | ICD-10-CM | POA: Diagnosis not present

## 2023-03-24 DIAGNOSIS — I4891 Unspecified atrial fibrillation: Secondary | ICD-10-CM | POA: Diagnosis not present

## 2023-03-24 DIAGNOSIS — J449 Chronic obstructive pulmonary disease, unspecified: Secondary | ICD-10-CM | POA: Diagnosis not present

## 2023-03-27 DIAGNOSIS — E785 Hyperlipidemia, unspecified: Secondary | ICD-10-CM | POA: Diagnosis not present

## 2023-03-27 DIAGNOSIS — G894 Chronic pain syndrome: Secondary | ICD-10-CM | POA: Diagnosis not present

## 2023-03-27 DIAGNOSIS — F329 Major depressive disorder, single episode, unspecified: Secondary | ICD-10-CM | POA: Diagnosis not present

## 2023-03-27 DIAGNOSIS — M5431 Sciatica, right side: Secondary | ICD-10-CM | POA: Diagnosis not present

## 2023-03-27 DIAGNOSIS — N390 Urinary tract infection, site not specified: Secondary | ICD-10-CM | POA: Diagnosis not present

## 2023-03-27 DIAGNOSIS — E1143 Type 2 diabetes mellitus with diabetic autonomic (poly)neuropathy: Secondary | ICD-10-CM | POA: Diagnosis not present

## 2023-03-27 DIAGNOSIS — F419 Anxiety disorder, unspecified: Secondary | ICD-10-CM | POA: Diagnosis not present

## 2023-03-27 DIAGNOSIS — I129 Hypertensive chronic kidney disease with stage 1 through stage 4 chronic kidney disease, or unspecified chronic kidney disease: Secondary | ICD-10-CM | POA: Diagnosis not present

## 2023-03-27 DIAGNOSIS — N1832 Chronic kidney disease, stage 3b: Secondary | ICD-10-CM | POA: Diagnosis not present

## 2023-03-27 DIAGNOSIS — M4800 Spinal stenosis, site unspecified: Secondary | ICD-10-CM | POA: Diagnosis not present

## 2023-03-27 DIAGNOSIS — M25561 Pain in right knee: Secondary | ICD-10-CM | POA: Diagnosis not present

## 2023-03-27 DIAGNOSIS — E1122 Type 2 diabetes mellitus with diabetic chronic kidney disease: Secondary | ICD-10-CM | POA: Diagnosis not present

## 2023-03-27 DIAGNOSIS — J449 Chronic obstructive pulmonary disease, unspecified: Secondary | ICD-10-CM | POA: Diagnosis not present

## 2023-03-27 DIAGNOSIS — G629 Polyneuropathy, unspecified: Secondary | ICD-10-CM | POA: Diagnosis not present

## 2023-03-27 DIAGNOSIS — M810 Age-related osteoporosis without current pathological fracture: Secondary | ICD-10-CM | POA: Diagnosis not present

## 2023-03-27 DIAGNOSIS — K080 Exfoliation of teeth due to systemic causes: Secondary | ICD-10-CM | POA: Diagnosis not present

## 2023-03-27 DIAGNOSIS — I480 Paroxysmal atrial fibrillation: Secondary | ICD-10-CM | POA: Diagnosis not present

## 2023-03-27 DIAGNOSIS — K219 Gastro-esophageal reflux disease without esophagitis: Secondary | ICD-10-CM | POA: Diagnosis not present

## 2023-03-27 DIAGNOSIS — R296 Repeated falls: Secondary | ICD-10-CM | POA: Diagnosis not present

## 2023-03-28 DIAGNOSIS — F411 Generalized anxiety disorder: Secondary | ICD-10-CM | POA: Diagnosis not present

## 2023-03-28 DIAGNOSIS — F331 Major depressive disorder, recurrent, moderate: Secondary | ICD-10-CM | POA: Diagnosis not present

## 2023-03-31 DIAGNOSIS — E785 Hyperlipidemia, unspecified: Secondary | ICD-10-CM | POA: Diagnosis not present

## 2023-03-31 DIAGNOSIS — F339 Major depressive disorder, recurrent, unspecified: Secondary | ICD-10-CM | POA: Diagnosis not present

## 2023-03-31 DIAGNOSIS — F419 Anxiety disorder, unspecified: Secondary | ICD-10-CM | POA: Diagnosis not present

## 2023-03-31 DIAGNOSIS — E1143 Type 2 diabetes mellitus with diabetic autonomic (poly)neuropathy: Secondary | ICD-10-CM | POA: Diagnosis not present

## 2023-03-31 DIAGNOSIS — I480 Paroxysmal atrial fibrillation: Secondary | ICD-10-CM | POA: Diagnosis not present

## 2023-03-31 DIAGNOSIS — F329 Major depressive disorder, single episode, unspecified: Secondary | ICD-10-CM | POA: Diagnosis not present

## 2023-03-31 DIAGNOSIS — M5431 Sciatica, right side: Secondary | ICD-10-CM | POA: Diagnosis not present

## 2023-03-31 DIAGNOSIS — N390 Urinary tract infection, site not specified: Secondary | ICD-10-CM | POA: Diagnosis not present

## 2023-03-31 DIAGNOSIS — J449 Chronic obstructive pulmonary disease, unspecified: Secondary | ICD-10-CM | POA: Diagnosis not present

## 2023-03-31 DIAGNOSIS — E1122 Type 2 diabetes mellitus with diabetic chronic kidney disease: Secondary | ICD-10-CM | POA: Diagnosis not present

## 2023-03-31 DIAGNOSIS — M810 Age-related osteoporosis without current pathological fracture: Secondary | ICD-10-CM | POA: Diagnosis not present

## 2023-03-31 DIAGNOSIS — K219 Gastro-esophageal reflux disease without esophagitis: Secondary | ICD-10-CM | POA: Diagnosis not present

## 2023-03-31 DIAGNOSIS — M4800 Spinal stenosis, site unspecified: Secondary | ICD-10-CM | POA: Diagnosis not present

## 2023-03-31 DIAGNOSIS — G894 Chronic pain syndrome: Secondary | ICD-10-CM | POA: Diagnosis not present

## 2023-03-31 DIAGNOSIS — R296 Repeated falls: Secondary | ICD-10-CM | POA: Diagnosis not present

## 2023-03-31 DIAGNOSIS — N1832 Chronic kidney disease, stage 3b: Secondary | ICD-10-CM | POA: Diagnosis not present

## 2023-03-31 DIAGNOSIS — I129 Hypertensive chronic kidney disease with stage 1 through stage 4 chronic kidney disease, or unspecified chronic kidney disease: Secondary | ICD-10-CM | POA: Diagnosis not present

## 2023-04-04 DIAGNOSIS — F331 Major depressive disorder, recurrent, moderate: Secondary | ICD-10-CM | POA: Diagnosis not present

## 2023-04-04 DIAGNOSIS — F411 Generalized anxiety disorder: Secondary | ICD-10-CM | POA: Diagnosis not present

## 2023-04-05 DIAGNOSIS — F5101 Primary insomnia: Secondary | ICD-10-CM | POA: Diagnosis not present

## 2023-04-05 DIAGNOSIS — M6281 Muscle weakness (generalized): Secondary | ICD-10-CM | POA: Diagnosis not present

## 2023-04-05 DIAGNOSIS — I10 Essential (primary) hypertension: Secondary | ICD-10-CM | POA: Diagnosis not present

## 2023-04-05 DIAGNOSIS — J449 Chronic obstructive pulmonary disease, unspecified: Secondary | ICD-10-CM | POA: Diagnosis not present

## 2023-04-06 DIAGNOSIS — M810 Age-related osteoporosis without current pathological fracture: Secondary | ICD-10-CM | POA: Diagnosis not present

## 2023-04-06 DIAGNOSIS — G894 Chronic pain syndrome: Secondary | ICD-10-CM | POA: Diagnosis not present

## 2023-04-06 DIAGNOSIS — E1122 Type 2 diabetes mellitus with diabetic chronic kidney disease: Secondary | ICD-10-CM | POA: Diagnosis not present

## 2023-04-06 DIAGNOSIS — E785 Hyperlipidemia, unspecified: Secondary | ICD-10-CM | POA: Diagnosis not present

## 2023-04-06 DIAGNOSIS — R296 Repeated falls: Secondary | ICD-10-CM | POA: Diagnosis not present

## 2023-04-06 DIAGNOSIS — E1143 Type 2 diabetes mellitus with diabetic autonomic (poly)neuropathy: Secondary | ICD-10-CM | POA: Diagnosis not present

## 2023-04-06 DIAGNOSIS — N390 Urinary tract infection, site not specified: Secondary | ICD-10-CM | POA: Diagnosis not present

## 2023-04-06 DIAGNOSIS — M5431 Sciatica, right side: Secondary | ICD-10-CM | POA: Diagnosis not present

## 2023-04-06 DIAGNOSIS — J449 Chronic obstructive pulmonary disease, unspecified: Secondary | ICD-10-CM | POA: Diagnosis not present

## 2023-04-06 DIAGNOSIS — N1832 Chronic kidney disease, stage 3b: Secondary | ICD-10-CM | POA: Diagnosis not present

## 2023-04-06 DIAGNOSIS — I480 Paroxysmal atrial fibrillation: Secondary | ICD-10-CM | POA: Diagnosis not present

## 2023-04-06 DIAGNOSIS — K219 Gastro-esophageal reflux disease without esophagitis: Secondary | ICD-10-CM | POA: Diagnosis not present

## 2023-04-06 DIAGNOSIS — M4800 Spinal stenosis, site unspecified: Secondary | ICD-10-CM | POA: Diagnosis not present

## 2023-04-06 DIAGNOSIS — F329 Major depressive disorder, single episode, unspecified: Secondary | ICD-10-CM | POA: Diagnosis not present

## 2023-04-06 DIAGNOSIS — I129 Hypertensive chronic kidney disease with stage 1 through stage 4 chronic kidney disease, or unspecified chronic kidney disease: Secondary | ICD-10-CM | POA: Diagnosis not present

## 2023-04-06 DIAGNOSIS — F419 Anxiety disorder, unspecified: Secondary | ICD-10-CM | POA: Diagnosis not present

## 2023-04-10 DIAGNOSIS — M5431 Sciatica, right side: Secondary | ICD-10-CM | POA: Diagnosis not present

## 2023-04-10 DIAGNOSIS — E1143 Type 2 diabetes mellitus with diabetic autonomic (poly)neuropathy: Secondary | ICD-10-CM | POA: Diagnosis not present

## 2023-04-10 DIAGNOSIS — I480 Paroxysmal atrial fibrillation: Secondary | ICD-10-CM | POA: Diagnosis not present

## 2023-04-10 DIAGNOSIS — N390 Urinary tract infection, site not specified: Secondary | ICD-10-CM | POA: Diagnosis not present

## 2023-04-10 DIAGNOSIS — R296 Repeated falls: Secondary | ICD-10-CM | POA: Diagnosis not present

## 2023-04-10 DIAGNOSIS — I129 Hypertensive chronic kidney disease with stage 1 through stage 4 chronic kidney disease, or unspecified chronic kidney disease: Secondary | ICD-10-CM | POA: Diagnosis not present

## 2023-04-10 DIAGNOSIS — M109 Gout, unspecified: Secondary | ICD-10-CM | POA: Diagnosis not present

## 2023-04-10 DIAGNOSIS — M810 Age-related osteoporosis without current pathological fracture: Secondary | ICD-10-CM | POA: Diagnosis not present

## 2023-04-10 DIAGNOSIS — E785 Hyperlipidemia, unspecified: Secondary | ICD-10-CM | POA: Diagnosis not present

## 2023-04-10 DIAGNOSIS — R600 Localized edema: Secondary | ICD-10-CM | POA: Diagnosis not present

## 2023-04-10 DIAGNOSIS — J449 Chronic obstructive pulmonary disease, unspecified: Secondary | ICD-10-CM | POA: Diagnosis not present

## 2023-04-10 DIAGNOSIS — F419 Anxiety disorder, unspecified: Secondary | ICD-10-CM | POA: Diagnosis not present

## 2023-04-10 DIAGNOSIS — M4800 Spinal stenosis, site unspecified: Secondary | ICD-10-CM | POA: Diagnosis not present

## 2023-04-10 DIAGNOSIS — G894 Chronic pain syndrome: Secondary | ICD-10-CM | POA: Diagnosis not present

## 2023-04-10 DIAGNOSIS — E1122 Type 2 diabetes mellitus with diabetic chronic kidney disease: Secondary | ICD-10-CM | POA: Diagnosis not present

## 2023-04-10 DIAGNOSIS — N1832 Chronic kidney disease, stage 3b: Secondary | ICD-10-CM | POA: Diagnosis not present

## 2023-04-10 DIAGNOSIS — F329 Major depressive disorder, single episode, unspecified: Secondary | ICD-10-CM | POA: Diagnosis not present

## 2023-04-10 DIAGNOSIS — K219 Gastro-esophageal reflux disease without esophagitis: Secondary | ICD-10-CM | POA: Diagnosis not present

## 2023-04-11 DIAGNOSIS — I129 Hypertensive chronic kidney disease with stage 1 through stage 4 chronic kidney disease, or unspecified chronic kidney disease: Secondary | ICD-10-CM | POA: Diagnosis not present

## 2023-04-11 DIAGNOSIS — E1122 Type 2 diabetes mellitus with diabetic chronic kidney disease: Secondary | ICD-10-CM | POA: Diagnosis not present

## 2023-04-11 DIAGNOSIS — F331 Major depressive disorder, recurrent, moderate: Secondary | ICD-10-CM | POA: Diagnosis not present

## 2023-04-11 DIAGNOSIS — F411 Generalized anxiety disorder: Secondary | ICD-10-CM | POA: Diagnosis not present

## 2023-04-11 DIAGNOSIS — G3184 Mild cognitive impairment, so stated: Secondary | ICD-10-CM | POA: Diagnosis not present

## 2023-04-11 DIAGNOSIS — G894 Chronic pain syndrome: Secondary | ICD-10-CM | POA: Diagnosis not present

## 2023-04-11 DIAGNOSIS — I480 Paroxysmal atrial fibrillation: Secondary | ICD-10-CM | POA: Diagnosis not present

## 2023-04-12 DIAGNOSIS — F339 Major depressive disorder, recurrent, unspecified: Secondary | ICD-10-CM | POA: Diagnosis not present

## 2023-04-12 DIAGNOSIS — E1143 Type 2 diabetes mellitus with diabetic autonomic (poly)neuropathy: Secondary | ICD-10-CM | POA: Diagnosis not present

## 2023-04-18 DIAGNOSIS — F411 Generalized anxiety disorder: Secondary | ICD-10-CM | POA: Diagnosis not present

## 2023-04-18 DIAGNOSIS — F331 Major depressive disorder, recurrent, moderate: Secondary | ICD-10-CM | POA: Diagnosis not present

## 2023-04-18 NOTE — Progress Notes (Signed)
Name: [name] DOB: Mar 28, 1924 MRN: [id number]  History of Present Illness: [name] is a 89 y.o. female who presents today for follow up visit at [facility] [place]. She resides at [facility]. She is accompanied by her granddaughter [name], who assists with providing history due to patient's dementia. - GU history: 1. Recurrent UTI. 2. Vaginal atrophy. 3. Renal cysts. - 10/24/2022: CT abdomen/pelvis w/o contrast showed "Atrophic right kidney. No hydronephrosis. Unchanged low-attenuation left renal lesions, likely simple cysts, no specific follow-up imaging is recommended."   Urine culture results in past 12 months: - 04/06/2022: Positive for Klebsiella pneumoniae - 05/24/2022: Negative - 09/15/2022: Positive for Klebsiella pneumoniae - No additional urine culture results in past 12 months found per chart review.  At initial visit on 01/18/2023: - Asymptomatic for UTI but reported external burning sensation in vagina.  - Reported nocturnal enuresis and daytime urine leakage without sensory awareness - possible urge incontinence. Denied stress incontinence. Reports minimal caffeine intake. - The plan was:  1. Apply topical vaginal estrogen cream nightly. 2. Adequate fluid intake (>1.5 liters/day) to flush out the urinary tract. 3. Urinate every 4-6 hours while awake to minimize urinary stasis/ bacterial overgrowth in the bladder. 4. Return in about 3 months (around 04/20/2023) for UA, PVR, & f/u with Evette Georges NP.  Today: Her granddaughter reports that [name] has had "many" UTIs since last visit and has been treated with antibiotics at least twice.   [name] reports intermittent pain with urination; unclear if that is internal (urethral) versus external (vulvovaginal) per her description. She denies fevers. Continues to have urinary incontinence at baseline. Her granddaughter reports that [name] also has frequent fecal incontinence. She is  wearing diapers.  [name] reports that the staff at [facility] give her topical vaginal estrogen cream to self-apply every day. She reports that she places it inside her vagina on her finger.   Fall Screening: Do you usually have a device to assist in your mobility? Yes   Medications: Current Outpatient Medications  Medication Sig Dispense Refill   allopurinol (ZYLOPRIM) 100 MG tablet TAKE (1/2) TABLET BY MOUTH DAILY. 15 tablet 6   ALPRAZolam (XANAX) 0.25 MG tablet 1/2 tablet taken once daily as needed for anxiety 15 tablet 0   carbamide peroxide (DEBROX) 6.5 % OTIC solution Place 5 drops into both ears 2 (two) times daily as needed (as needed for ear wax build up). 15 mL 0   Cyanocobalamin (B-12) 1000 MCG TABS      DAILY MULTIPLE VITAMINS PO TAKE ONE TABLET BY MOUTHSONCE DAILY.     diltiazem (CARDIZEM CD) 120 MG 24 hr capsule Take 1 capsule (120 mg total) by mouth daily. 90 capsule 1   estradiol (ESTRACE) 0.1 MG/GM vaginal cream Discard plastic applicator. Insert a blueberry size amount (approximately 1 gram) of cream on fingertip inside vagina at bedtime every night (for long term use). 30 g 3   gabapentin (NEURONTIN) 100 MG capsule 200 mg tid 90 capsule 2   HYDROcodone-acetaminophen (NORCO/VICODIN) 5-325 MG tablet One half or 1 tablet taken once daily as needed maximum in a days time is 1 pill 20 tablet 0   ibuprofen (ADVIL) 800 MG tablet Take 1 tablet (800 mg total) by mouth every 8 (eight) hours as needed. 90 tablet 0   levothyroxine (SYNTHROID) 75 MCG tablet Take 1 tablet (75 mcg total) by mouth daily. 90 tablet 1   lisinopril (ZESTRIL) 2.5 MG tablet Take 1 tablet (2.5  mg total) by mouth daily. 90 tablet 1   melatonin 3 MG TABS tablet Take 3 mg by mouth at bedtime.     Multiple Vitamin (MULTIVITAMIN WITH MINERALS) TABS tablet Take 1 tablet by mouth daily.     mupirocin ointment (BACTROBAN) 2 % Apply 1 Application topically 2 (two) times daily. 22 g 0   omeprazole (PRILOSEC) 40  MG capsule Take 40 mg by mouth daily.     ondansetron (ZOFRAN) 4 MG tablet Take 1 tablet (4 mg total) by mouth every 8 (eight) hours as needed for nausea or vomiting. 20 tablet 0   ondansetron (ZOFRAN-ODT) 4 MG disintegrating tablet Take 1 tablet (4 mg total) by mouth every 8 (eight) hours as needed for up to 10 doses for nausea or vomiting. 10 tablet 0   oxymetazoline (AFRIN) 0.05 % nasal spray Place 1 spray into both nostrils 2 (two) times daily.     pantoprazole (PROTONIX) 40 MG tablet TAKE 1 TABLET BY MOUTH ONCE DAILY. 90 tablet 1   polyethylene glycol powder (GLYCOLAX/MIRALAX) 17 GM/SCOOP powder Take 17 g by mouth daily.     potassium chloride (KLOR-CON) 10 MEQ tablet TAKE ONE TABLET BY MOUTH ONCE DAILY. 30 tablet 0   potassium chloride SA (KLOR-CON M) 20 MEQ tablet Take 20 mEq by mouth daily.     Propylene Glycol (SYSTANE COMPLETE OP) Apply 1 drop to eye daily as needed (dryness).     QC ACETAMINOPHEN 8 HOURS 650 MG CR tablet Take 1 tablet by mouth in the morning and at bedtime.     sertraline (ZOLOFT) 50 MG tablet Take 1 tablet (50 mg total) by mouth daily. 30 tablet 4   torsemide (DEMADEX) 20 MG tablet TAKE (1) TABLET BY MOUTH EVERY OTHER DAY. 45 tablet 1   trimethoprim (TRIMPEX) 100 MG tablet Take 1 tablet (100 mg total) by mouth daily. 30 tablet 11   trolamine salicylate (ASPERCREME) 10 % cream Apply 1 application topically as needed (feet and shoulders).     vitamin B-12 1000 MCG tablet Take 1 tablet (1,000 mcg total) by mouth daily. 30 tablet 1   No current facility-administered medications for this visit.    Allergies: Allergies  Allergen Reactions   Amiodarone Itching   Cephalosporins Itching   Meloxicam Itching   Robaxin [Methocarbamol] Itching   Amoxicillin Hives   Lovenox [Enoxaparin Sodium] Nausea And Vomiting   Morphine Other (See Comments)    Unknown Other reaction(s): Not available   Morphine And Codeine Itching   Penicillins Swelling        Sulfa Antibiotics  Swelling   Lipitor [Atorvastatin] Other (See Comments)    Unknown:patient is not aware of this allergy   Lorazepam Other (See Comments)    Exceptional fatigue    Macrobid [Nitrofurantoin] Other (See Comments)    Constipation   Metoprolol     hallucinations    Past Medical History:  Diagnosis Date   Abdominal pain, chronic, right lower quadrant    "ever since I had my appendix removed"   Anxiety    Atrial fibrillation (HCC)    Breast cancer (HCC)    Cancer (HCC)    left side   Chronic back pain    Edema    GERD (gastroesophageal reflux disease)    Hyperlipidemia    Hypertension    Osteopenia    Osteoporosis    Ovarian cyst 02/2015   left   Recurrent abdominal pain    LLQ   Past Surgical History:  Procedure  Laterality Date   APPENDECTOMY     APPENDECTOMY     BREAST SURGERY Left    lumpectomy   CARDIOVERSION N/A 03/31/2016   Procedure: CARDIOVERSION;  Surgeon: Wendall Stade, MD;  Location: [facility];  Service: Cardiovascular;  Laterality: N/A;   COLON RESECTION     COLON SURGERY     HERNIA REPAIR     HERNIA REPAIR     Family History  Problem Relation Age of Onset   Diabetes Mother    Heart disease Other    Arthritis Other    Cancer Other    Hyperlipidemia Brother    Social History   Socioeconomic History   Marital status: Widowed    Spouse name: Not on file   Number of children: 1   Years of education: 22   Highest education level: 12th grade  Occupational History    Employer: RETIRED  Tobacco Use   Smoking status: Never    Passive exposure: Never   Smokeless tobacco: Never  Vaping Use   Vaping status: Never Used  Substance and Sexual Activity   Alcohol use: No    Alcohol/week: 0.0 standard drinks of alcohol   Drug use: No   Sexual activity: Not Currently    Partners: Male  Other Topics Concern   Not on file  Social History Narrative   Not on file   Social Determinants of Health   Financial Resource Strain: Low Risk  (08/24/2022)   Overall  Financial Resource Strain (CARDIA)    Difficulty of Paying Living Expenses: Not hard at all  Food Insecurity: No Food Insecurity (08/24/2022)   Hunger Vital Sign    Worried About Running Out of Food in the Last Year: Never true    Ran Out of Food in the Last Year: Never true  Transportation Needs: No Transportation Needs (08/24/2022)   PRAPARE - Administrator, Civil Service (Medical): No    Lack of Transportation (Non-Medical): No  Physical Activity: Inactive (08/24/2022)   Exercise Vital Sign    Days of Exercise per Week: 0 days    Minutes of Exercise per Session: 0 min  Stress: No Stress Concern Present (08/24/2022)   [employer] of Occupational Health - Occupational Stress Questionnaire    Feeling of Stress : Only a little  Social Connections: Moderately Integrated (08/24/2022)   Social Connection and Isolation Panel [NHANES]    Frequency of Communication with Friends and Family: More than three times a week    Frequency of Social Gatherings with Friends and Family: More than three times a week    Attends Religious Services: More than 4 times per year    Active Member of [employer] or Organizations: Yes    Attends Banker Meetings: More than 4 times per year    Marital Status: Widowed  Intimate Partner Violence: Not At Risk (08/24/2022)   Humiliation, Afraid, Rape, and Kick questionnaire    Fear of Current or Ex-Partner: No    Emotionally Abused: No    Physically Abused: No    Sexually Abused: No    Patient unable to participate fully in Review of Systems due to dementia Constitutional: Patient reports feeling bad Musculoskeletal: Patient reports weakness  GU: As per HPI.  OBJECTIVE Vitals:   04/25/23 0954  BP: 125/62  Pulse: 80  Temp: 98.3 F (36.8 C)   There is no height or weight on file to calculate BMI.  Physical Examination Constitutional: No obvious distress; patient is non-toxic appearing  Cardiovascular: No visible lower extremity edema.   Respiratory: The patient does not have audible wheezing/stridor; respirations do not appear labored  Gastrointestinal: Abdomen non-distended Musculoskeletal: Normal ROM of UEs  Skin: No obvious rashes/open sores  Neurologic: CN 2-12 grossly intact Psychiatric: Pleasant affect  Hematologic/Lymphatic/Immunologic: No obvious bruises or sites of spontaneous bleeding  UA (I&O): >30 WBC/hpf, 3-10 RBC/hpf, many bacteria  PVR (prior to I&O cath): 165 ml  ASSESSMENT Recurrent UTI - Plan: BLADDER SCAN AMB NON-IMAGING, In and Out Cath, trimethoprim (TRIMPEX) 100 MG tablet, Urine culture, Urinalysis, Routine w reflex microscopic, CANCELED: Urinalysis, Routine w reflex microscopic  Atrophic vaginitis - Plan: BLADDER SCAN AMB NON-IMAGING, In and Out Cath, Urinalysis, Routine w reflex microscopic, CANCELED: Urinalysis, Routine w reflex microscopic  Urinary incontinence, unspecified type - Plan: Urinalysis, Routine w reflex microscopic  Incontinence of feces, unspecified fecal incontinence type - Plan: Urinalysis, Routine w reflex microscopic  Ambulatory dysfunction - Plan: Urinalysis, Routine w reflex microscopic  Self-care deficit for hygiene - Plan: Urinalysis, Routine w reflex microscopic  Dementia, unspecified dementia severity, unspecified dementia type, unspecified whether behavioral, psychotic, or mood disturbance or anxiety (HCC) - Plan: Urinalysis, Routine w reflex microscopic  Patient / granddaughter were made aware that we currently do not have any outside urine testing results from the past 6+ months. Will request all urinalysis & urine culture records from [facility] for review.  We discussed the possible etiologies of recurrent UTls including ascending infection related to intercourse; vaginal atrophy; transmural infection that has been treated incompletely; urinary tract stones; incomplete bladder emptying with urinary stasis; kidney or bladder tumor; urethral diverticulum; and  colonization of  vagina and urinary tract with pathologic, adherent organisms.   For UTI prevention we discussed options including: Maintain adequate fluid intake daily to flush out the urinary tract. - Go to the bathroom to urinate every 4-6 hours while awake to minimize urinary stasis / bacterial overgrowth in the bladder. - Routine genital cleaning. - Proanthocyanidin (PAC) supplement 36 mg daily; must be soluble (insoluble form of PAC will be ineffective). Recommend Ellura. D-mannose 2 g daily Vitamin C supplement Probiotic to maintain healthy vaginal microbiome - Topical vaginal estrogen for vaginal atrophy. We reviewed the etiology and consequences of urogenital epithelial atrophy and how the normal bacterial flora that colonizes the perineum may contribute to UTI risk because the thin urethral epithelium allows the bacteria to become adherent and the change in vaginal pH can disrupt the vaginal / urethral microbiome and allow for bacterial overgrowth. Due to her dementia Mrs. Bal is not reliable for self-applying her daily topical vaginal estrogen cream, therefore a [facility] staff member will be instructed to apply that for her (patient & family strongly prefer a female team member for that).   Due to Mrs. Vesey' dementia it is difficult to discern whether she is experiencing symptomatic UTIs versus asymptomatic bacteriuria with vulvovaginal irritation when urine touches that atrophic tissue. - We reviewed observable UTI symptoms such as fever, increased confusion, fatigue, weakness. - We discussed that urine color, clarity, and odor are not clinical indicators of UTI. - We discussed the rationale for withholding acute antibiotic treatment when asymptomatic bacteriuria is suspected in order to minimize the risk for developing antibiotic-resistant pathogens.  - In her case however, we agreed that the benefits outweigh the potential risks related to use of a daily low dose antibiotic  for UTI prevention. Agreed to start Trimethoprim 100 mg daily.   Abnormal UA today per I&O catheterized urine specimen. Agreed  to hold off on empiric antibiotic treatment due to lack of urine culture records for the past 6 months and due to patient's multiple antibiotic allergies. Will check urine culture and treat as indicated based on results.  Will plan for follow up in 6 weeks or sooner if needed. Pt verbalized understanding and agreement. All questions were answered.  PLAN Advised the following: 1. Requesting urinalysis & urine culture records from [facility]. 2. Urine culture. 3. Start Trimethoprim 100 mg daily. 4. Continue taking D-mannose supplement daily. 5. Continue nightly use of topical vaginal estrogen cream as prescribed. Staff at [facility] are instructed to apply topical vaginal estrogen cream daily as prescribed (patient is not to self-apply). Patient / family strongly prefer a female staff member for this. 6. Staff at [facility] are instructed to encourage hydration with water at least once every 2-4 hours every day while patient is awake.  7. Staff at [facility] are instructed to encourage patient to use the toilet at least once every 4-6 hours while awake and assist her to the toilet. 8. Staff at [facility] are instructed to cleanse patient's genital area at least once every 8 hours and change diaper if soiled.  9. Return in about 6 weeks (around 06/06/2023) for UA, PVR, & f/u with Evette Georges NP.  Orders Placed This Encounter  Procedures   Urine culture   Urinalysis, Routine w reflex microscopic   In and Out Cath   BLADDER SCAN AMB NON-IMAGING   Total time spent caring for the patient today was over 40 minutes. This includes time spent on the date of the visit reviewing the patient's chart before the visit, time spent during the visit, and time spent after the visit on documentation. Over 50% of that time was spent in face-to-face time with this patient  for direct counseling. E&M based on time and complexity of medical decision making.  It has been explained that the patient is to follow regularly with their PCP in addition to all other providers involved in their care and to follow instructions provided by these respective offices. Patient advised to contact urology clinic if any urologic-pertaining questions, concerns, new symptoms or problems arise in the interim period.  Patient Instructions  Recommendations regarding UTI prevention / management:  Options when UTI symptoms occur: 1. Call [facility] [place] to request urgent / same-day visit (phone # [phone]).  2. Call your Primary Care Provider (PCP) office to request urgent / same-day visit. Be sure to request for urine culture to be ordered and have results faxed to Urology (fax # [phone]).  3. Go to urgent care. Be sure to request for urine culture to be ordered and have results faxed to Urology (fax # [phone]).   For bladder pain/ burning with urination: - Can take OTC Pyridium (phenazopyridine; commonly known under the "AZO" brand) for a few days as needed. Limit use to no more than 3 days consecutively due to risk for methemoglobinemia, liver function issues, and bone health damage with long term use of Pyridium.  Routine use for UTI prevention: - Topical vaginal estrogen for vaginal atrophy. - Adequate daily fluid intake to flush out the urinary tract. - Go to the bathroom to urinate every 4-6 hours while awake to minimize urinary stasis / bacterial overgrowth in the bladder. - Proanthocyanidin (PAC) supplement 36 mg daily; must be soluble (insoluble form of PAC will be ineffective). Recommended brand: Ellura. This is an over-the-counter supplement (often must be found/ purchased online)  supplement derived from cranberries with concentrated active component: Proanthocyanidin (PAC) 36 mg daily. Decreases bacterial adherence to bladder lining.  - D-mannose  powder (2 grams daily). This is an over-the-counter supplement which decreases bacterial adherence to bladder lining (it is a sugar that inhibits bacterial adherence to urothelial cells by binding to the pili of enteric bacteria). Take as per manufacturer recommendation. Can be used as an alternative or in addition to the concentrated cranberry supplement.  - Vitamin C supplement to acidify urine to minimize bacterial growth.  - Probiotic to maintain healthy vaginal microbiome to suppress bacteria at urethral opening. Brand recommendations: Darrold Junker (includes probiotic & D-mannose ), Feminine Balance (highest concentration of lactobacillus) or Hyperbiotic Pro 15.  Note for patients with diabetes:  - Be aware that D-mannose contains sugar.  Note for patients with interstitial cystitis (IC):  - Patients with IC should typically avoid cranberry/ PAC supplements and Vitamin C supplements due to their acidity, which may exacerbate IC-related bladder pain. - Symptoms of true bacterial UTI can overlap / mimic symptoms of an IC flare up. Antibiotic use is NOT indicated for IC flare ups. Urine culture needed prior to antibiotic treatment for IC patients. The goal is to minimize your risk for developing antibiotic-resistant bacteria.    Vulvovaginal atrophy I Genitourinary syndrome of menopause (GSM):  What it is: Changes in the vaginal environment (including the vulva and urethra) including: Thinning of the epithelium (skin/ mucosa surface) Can contribute to urinary urgency and frequency Can contribute to dryness, itching, irritation of the vulvar and vaginal tissue Can contribute to pain with intercourse Can contribute to physical changes of the labia, vulva, and vagina such as: Narrowing of the vaginal opening Decreased vaginal length Loss of labial architecture Labial adhesions Pale color of vulvovaginal tissue  Loss of pubic hair Allows bacteria to become adherent  Results in increased risk for  urinary tract infection (UTI) due to bacterial overgrowth and migration up the urethra into the bladder Change in vaginal pH (acid/ base balance) Allows for alteration / disruption of the normal bacterial flora / microbiome Results in increased risk for urinary tract infection (UTI) due to bacterial overgrowth  Treatment options: Over-the-counter lubricants (see list below). Prescription vaginal estrogen replacement. Options: Topical vaginal estrogen cream Estrace, Premarin, or compounded estradiol cream/ gel We advise: Discard plastic applicator as that tends to use more medication than you need, which is not harmful but wastes / uses up the medication. Also the plastic applicator may cause discomfort. Insert blueberry size amount of medication via the tip of your finger inside vagina nightly for 1 week then 2-3 times per week (long term). Estring vaginal ring Exchanged every 3 months (either at home or in office by provider) Vagifem vaginal tablet Inserted nightly for 2 weeks then twice a week (long term) lntrarosa vaginal suppository Vaginal DHEA: converts to estrogen in vaginal tissue without systemic effect Inserted nightly (long term) Vaginal laser therapy (Mona Lisa touch) Performed in 3 treatments each 6 weeks apart (available in our [place] office). Can feel like a sunburn for 3-4 days after each treatment until new skin heals in. Usually not covered by insurance. Estimated cost is $1500 for all 3 sessions.  FYI regarding prescription vaginal estrogen treatment options: All topical vaginal estrogen replacement options are equivalent in terms of efficacy. Topical vaginal estrogen replacement will take about 3 months to be effective. OK to have sex with any of the topical vaginal estrogen replacement options. Topical vaginal estrogen replacement may sting/burn initially due to  severe dryness, which will improve with ongoing treatment. There have been studies that evaluate use of  low-dose intravaginal estrogen that show minimal systemic absorption which is negligible after 3 weeks. There have been no studies indicating increased risk of contributing to cancer development or recurrence.  Topical vaginal estrogen cream safe to use with breast cancer history [email or web address]  Topical vaginal estrogen cream safe to use with blood clot history [email or web address]   Lubricants and Moisturizers for Treating Genitourinary Syndrome of Menopause and Vulvovaginal Atrophy Treatment Comments I Available Products   Lubricants   Water-based Ingredients: Deionized water, glycerin, propylene glycol; latex safe; rare irritation; dry out with extended sexual activity Astroglide, Good Clean Love, K-Y Jelly, Natural, Organic, Pink, Sliquid, Sylk, Yes    Oil Based Ingredients: avocado, olive, peanut, corn; latex safe; can be used with silicone products; staining; safe (unless peanut allergy); non-irritating Coconut oil, vegetable oil, vitamin E oil  Silicone-Based Ingredients: Silicone polymers; staining; typically nonirritating, long lasting; waterproof; should not be used with silicone dilators, sexual toys, or gynecologic products Astroglide X, Oceanus Ultra Pure, Pink Silicone, Pjur Eros, Replens Silky Smooth, Silicone Premium JO, SKYN, Uberlube, [employer] Based Minimize harm to sperm motility; designed Astroglide TTC, Conceive Plus, Pre for couples trying to conceive Seed, Yes Baby  Fertility Friendly Minimize harm to sperm motility; designed Astroglide, TTC, Conceive Plus, Pre for couples trying to conceive Seed, Yes Baby  Vaginal Moisturizers   Vaginal Moisturizers For maintenance use 1 to 3 times weekly; can benefit  women with dryness, chafing with AOL, and recurrent vaginal infections irrespective of sexual activity timing Balance Active Menopause Vaginal Moisturizing Lubricant, Canesintima Intimate Moisturizer, Replens, Rephresh, Sylk Natural Intimate Moisturizer, Yes Vaginal Moisturizer  Hybrids Properties of both water and silicone-based products (combination of a vaginal lubricant and moisturizer); Non-irritating; good option for women with allergies and sensitivities Lubrigyn, Luvena  Suppositories Hyaluronic acid to retain moisture Revaree  Vulvar Soothing Creams/Oils    Medicated CreamsP ain and burn relief; Ingredients: 4% Lidocaine, Aloe Vera gel Releveum ([place])  Non-Medicated Creams For anti-itch and moisture/maintenance; Ingredients: Coconut oil, Avocado oil, Shea Butter, Olive oil, Vitamin E Vajuvenate, Vmagic  Oils !For moisture/maintenance !Coconut oil, Vitamin E oil, Emu oil     Electronically signed by:  Donnita Falls, FNP   04/25/23    11:31 AM

## 2023-04-19 ENCOUNTER — Telehealth: Payer: Self-pay

## 2023-04-19 DIAGNOSIS — K219 Gastro-esophageal reflux disease without esophagitis: Secondary | ICD-10-CM | POA: Diagnosis not present

## 2023-04-19 DIAGNOSIS — R296 Repeated falls: Secondary | ICD-10-CM | POA: Diagnosis not present

## 2023-04-19 DIAGNOSIS — M810 Age-related osteoporosis without current pathological fracture: Secondary | ICD-10-CM | POA: Diagnosis not present

## 2023-04-19 DIAGNOSIS — M5431 Sciatica, right side: Secondary | ICD-10-CM | POA: Diagnosis not present

## 2023-04-19 DIAGNOSIS — I480 Paroxysmal atrial fibrillation: Secondary | ICD-10-CM | POA: Diagnosis not present

## 2023-04-19 DIAGNOSIS — F329 Major depressive disorder, single episode, unspecified: Secondary | ICD-10-CM | POA: Diagnosis not present

## 2023-04-19 DIAGNOSIS — G894 Chronic pain syndrome: Secondary | ICD-10-CM | POA: Diagnosis not present

## 2023-04-19 DIAGNOSIS — I129 Hypertensive chronic kidney disease with stage 1 through stage 4 chronic kidney disease, or unspecified chronic kidney disease: Secondary | ICD-10-CM | POA: Diagnosis not present

## 2023-04-19 DIAGNOSIS — E785 Hyperlipidemia, unspecified: Secondary | ICD-10-CM | POA: Diagnosis not present

## 2023-04-19 DIAGNOSIS — E1122 Type 2 diabetes mellitus with diabetic chronic kidney disease: Secondary | ICD-10-CM | POA: Diagnosis not present

## 2023-04-19 DIAGNOSIS — N1832 Chronic kidney disease, stage 3b: Secondary | ICD-10-CM | POA: Diagnosis not present

## 2023-04-19 DIAGNOSIS — E1143 Type 2 diabetes mellitus with diabetic autonomic (poly)neuropathy: Secondary | ICD-10-CM | POA: Diagnosis not present

## 2023-04-19 DIAGNOSIS — J449 Chronic obstructive pulmonary disease, unspecified: Secondary | ICD-10-CM | POA: Diagnosis not present

## 2023-04-19 DIAGNOSIS — N390 Urinary tract infection, site not specified: Secondary | ICD-10-CM | POA: Diagnosis not present

## 2023-04-19 DIAGNOSIS — M4800 Spinal stenosis, site unspecified: Secondary | ICD-10-CM | POA: Diagnosis not present

## 2023-04-19 DIAGNOSIS — F419 Anxiety disorder, unspecified: Secondary | ICD-10-CM | POA: Diagnosis not present

## 2023-04-19 NOTE — Telephone Encounter (Signed)
I spoke with patient's granddaughter.  She will do her best to be at this appointment, her father is in hospice and she is the only caregiver for both him and her grandmother.  She was also not aware that the facility made these appointments for her.  Office address provided and she states she will be present at next appt.

## 2023-04-19 NOTE — Telephone Encounter (Signed)
-----   Message from [name] sent at 04/18/2023  4:46 PM EST ----- Please notify patient's family / POA that they need to accompany patient to her appointment here on 04/25/2023 due to her cognitive communication disorder. Last time she was accompanied only by the transport staff from her SNF.

## 2023-04-22 DIAGNOSIS — I480 Paroxysmal atrial fibrillation: Secondary | ICD-10-CM | POA: Diagnosis not present

## 2023-04-22 DIAGNOSIS — E785 Hyperlipidemia, unspecified: Secondary | ICD-10-CM | POA: Diagnosis not present

## 2023-04-22 DIAGNOSIS — K219 Gastro-esophageal reflux disease without esophagitis: Secondary | ICD-10-CM | POA: Diagnosis not present

## 2023-04-22 DIAGNOSIS — N390 Urinary tract infection, site not specified: Secondary | ICD-10-CM | POA: Diagnosis not present

## 2023-04-22 DIAGNOSIS — E039 Hypothyroidism, unspecified: Secondary | ICD-10-CM | POA: Diagnosis not present

## 2023-04-22 DIAGNOSIS — J449 Chronic obstructive pulmonary disease, unspecified: Secondary | ICD-10-CM | POA: Diagnosis not present

## 2023-04-22 DIAGNOSIS — R296 Repeated falls: Secondary | ICD-10-CM | POA: Diagnosis not present

## 2023-04-22 DIAGNOSIS — M109 Gout, unspecified: Secondary | ICD-10-CM | POA: Diagnosis not present

## 2023-04-22 DIAGNOSIS — Z556 Problems related to health literacy: Secondary | ICD-10-CM | POA: Diagnosis not present

## 2023-04-22 DIAGNOSIS — F329 Major depressive disorder, single episode, unspecified: Secondary | ICD-10-CM | POA: Diagnosis not present

## 2023-04-22 DIAGNOSIS — Z853 Personal history of malignant neoplasm of breast: Secondary | ICD-10-CM | POA: Diagnosis not present

## 2023-04-22 DIAGNOSIS — Z9181 History of falling: Secondary | ICD-10-CM | POA: Diagnosis not present

## 2023-04-22 DIAGNOSIS — I129 Hypertensive chronic kidney disease with stage 1 through stage 4 chronic kidney disease, or unspecified chronic kidney disease: Secondary | ICD-10-CM | POA: Diagnosis not present

## 2023-04-22 DIAGNOSIS — G894 Chronic pain syndrome: Secondary | ICD-10-CM | POA: Diagnosis not present

## 2023-04-22 DIAGNOSIS — R41841 Cognitive communication deficit: Secondary | ICD-10-CM | POA: Diagnosis not present

## 2023-04-22 DIAGNOSIS — N1832 Chronic kidney disease, stage 3b: Secondary | ICD-10-CM | POA: Diagnosis not present

## 2023-04-22 DIAGNOSIS — M5431 Sciatica, right side: Secondary | ICD-10-CM | POA: Diagnosis not present

## 2023-04-22 DIAGNOSIS — S0083XD Contusion of other part of head, subsequent encounter: Secondary | ICD-10-CM | POA: Diagnosis not present

## 2023-04-22 DIAGNOSIS — F419 Anxiety disorder, unspecified: Secondary | ICD-10-CM | POA: Diagnosis not present

## 2023-04-22 DIAGNOSIS — E538 Deficiency of other specified B group vitamins: Secondary | ICD-10-CM | POA: Diagnosis not present

## 2023-04-22 DIAGNOSIS — M810 Age-related osteoporosis without current pathological fracture: Secondary | ICD-10-CM | POA: Diagnosis not present

## 2023-04-22 DIAGNOSIS — Z7952 Long term (current) use of systemic steroids: Secondary | ICD-10-CM | POA: Diagnosis not present

## 2023-04-22 DIAGNOSIS — E1122 Type 2 diabetes mellitus with diabetic chronic kidney disease: Secondary | ICD-10-CM | POA: Diagnosis not present

## 2023-04-22 DIAGNOSIS — M4800 Spinal stenosis, site unspecified: Secondary | ICD-10-CM | POA: Diagnosis not present

## 2023-04-22 DIAGNOSIS — E1143 Type 2 diabetes mellitus with diabetic autonomic (poly)neuropathy: Secondary | ICD-10-CM | POA: Diagnosis not present

## 2023-04-24 DIAGNOSIS — G894 Chronic pain syndrome: Secondary | ICD-10-CM | POA: Diagnosis not present

## 2023-04-24 DIAGNOSIS — M4800 Spinal stenosis, site unspecified: Secondary | ICD-10-CM | POA: Diagnosis not present

## 2023-04-24 DIAGNOSIS — F329 Major depressive disorder, single episode, unspecified: Secondary | ICD-10-CM | POA: Diagnosis not present

## 2023-04-24 DIAGNOSIS — N390 Urinary tract infection, site not specified: Secondary | ICD-10-CM | POA: Diagnosis not present

## 2023-04-24 DIAGNOSIS — F419 Anxiety disorder, unspecified: Secondary | ICD-10-CM | POA: Diagnosis not present

## 2023-04-24 DIAGNOSIS — N1832 Chronic kidney disease, stage 3b: Secondary | ICD-10-CM | POA: Diagnosis not present

## 2023-04-24 DIAGNOSIS — M5431 Sciatica, right side: Secondary | ICD-10-CM | POA: Diagnosis not present

## 2023-04-24 DIAGNOSIS — J449 Chronic obstructive pulmonary disease, unspecified: Secondary | ICD-10-CM | POA: Diagnosis not present

## 2023-04-24 DIAGNOSIS — M810 Age-related osteoporosis without current pathological fracture: Secondary | ICD-10-CM | POA: Diagnosis not present

## 2023-04-24 DIAGNOSIS — E785 Hyperlipidemia, unspecified: Secondary | ICD-10-CM | POA: Diagnosis not present

## 2023-04-24 DIAGNOSIS — K219 Gastro-esophageal reflux disease without esophagitis: Secondary | ICD-10-CM | POA: Diagnosis not present

## 2023-04-24 DIAGNOSIS — E1143 Type 2 diabetes mellitus with diabetic autonomic (poly)neuropathy: Secondary | ICD-10-CM | POA: Diagnosis not present

## 2023-04-24 DIAGNOSIS — I480 Paroxysmal atrial fibrillation: Secondary | ICD-10-CM | POA: Diagnosis not present

## 2023-04-24 DIAGNOSIS — R296 Repeated falls: Secondary | ICD-10-CM | POA: Diagnosis not present

## 2023-04-24 DIAGNOSIS — E1122 Type 2 diabetes mellitus with diabetic chronic kidney disease: Secondary | ICD-10-CM | POA: Diagnosis not present

## 2023-04-24 DIAGNOSIS — I129 Hypertensive chronic kidney disease with stage 1 through stage 4 chronic kidney disease, or unspecified chronic kidney disease: Secondary | ICD-10-CM | POA: Diagnosis not present

## 2023-04-25 ENCOUNTER — Encounter: Payer: Self-pay | Admitting: Urology

## 2023-04-25 ENCOUNTER — Ambulatory Visit: Payer: Medicare Other | Admitting: Urology

## 2023-04-25 VITALS — BP 125/62 | HR 80 | Temp 98.3°F

## 2023-04-25 DIAGNOSIS — N390 Urinary tract infection, site not specified: Secondary | ICD-10-CM | POA: Diagnosis not present

## 2023-04-25 DIAGNOSIS — R262 Difficulty in walking, not elsewhere classified: Secondary | ICD-10-CM | POA: Diagnosis not present

## 2023-04-25 DIAGNOSIS — Z87440 Personal history of urinary (tract) infections: Secondary | ICD-10-CM | POA: Diagnosis not present

## 2023-04-25 DIAGNOSIS — F0390 Unspecified dementia without behavioral disturbance: Secondary | ICD-10-CM

## 2023-04-25 DIAGNOSIS — R159 Full incontinence of feces: Secondary | ICD-10-CM | POA: Diagnosis not present

## 2023-04-25 DIAGNOSIS — Z741 Need for assistance with personal care: Secondary | ICD-10-CM

## 2023-04-25 DIAGNOSIS — N952 Postmenopausal atrophic vaginitis: Secondary | ICD-10-CM | POA: Diagnosis not present

## 2023-04-25 DIAGNOSIS — R32 Unspecified urinary incontinence: Secondary | ICD-10-CM

## 2023-04-25 LAB — URINALYSIS, ROUTINE W REFLEX MICROSCOPIC
Bilirubin, UA: NEGATIVE
Glucose, UA: NEGATIVE
Ketones, UA: NEGATIVE
Nitrite, UA: NEGATIVE
Protein,UA: NEGATIVE
RBC, UA: NEGATIVE
Specific Gravity, UA: 1.02 (ref 1.005–1.030)
Urobilinogen, Ur: 0.2 mg/dL (ref 0.2–1.0)
pH, UA: 6 (ref 5.0–7.5)

## 2023-04-25 LAB — MICROSCOPIC EXAMINATION: WBC, UA: >30 /[HPF] — AB (ref 0–5)

## 2023-04-25 LAB — BLADDER SCAN AMB NON-IMAGING: Scan Result: 165

## 2023-04-25 MED ORDER — TRIMETHOPRIM 100 MG PO TABS: 100.0000 mg | ORAL_TABLET | Freq: Every day | ORAL | 11 refills | Status: DC

## 2023-04-25 NOTE — Patient Instructions (Signed)
Recommendations regarding UTI prevention / management:  Options when UTI symptoms occur: 1. Call [facility] to request urgent / same-day visit (phone # [phone]).  2. Call your Primary Care Provider (PCP) office to request urgent / same-day visit. Be sure to request for urine culture to be ordered and have results faxed to Urology (fax # [phone]).  3. Go to urgent care. Be sure to request for urine culture to be ordered and have results faxed to Urology (fax # [phone]).   For bladder pain/ burning with urination: - Can take OTC Pyridium (phenazopyridine; commonly known under the "AZO" brand) for a few days as needed. Limit use to no more than 3 days consecutively due to risk for methemoglobinemia, liver function issues, and bone health damage with long term use of Pyridium.  Routine use for UTI prevention: - Topical vaginal estrogen for vaginal atrophy. - Adequate daily fluid intake to flush out the urinary tract. - Go to the bathroom to urinate every 4-6 hours while awake to minimize urinary stasis / bacterial overgrowth in the bladder. - Proanthocyanidin (PAC) supplement 36 mg daily; must be soluble (insoluble form of PAC will be ineffective). Recommended brand: Ellura. This is an over-the-counter supplement (often must be found/ purchased online) supplement derived from cranberries with concentrated active component: Proanthocyanidin (PAC) 36 mg daily. Decreases bacterial adherence to bladder lining.  - D-mannose powder (2 grams daily). This is an over-the-counter supplement which decreases bacterial adherence to bladder lining (it is a sugar that inhibits bacterial adherence to urothelial cells by binding to the pili of enteric bacteria). Take as per manufacturer recommendation. Can be used as an alternative or in addition to the concentrated cranberry supplement.  - Vitamin C supplement to acidify urine to minimize bacterial growth.  - Probiotic to maintain  healthy vaginal microbiome to suppress bacteria at urethral opening. Brand recommendations: Darrold Junker (includes probiotic & D-mannose ), Feminine Balance (highest concentration of lactobacillus) or Hyperbiotic Pro 15.  Note for patients with diabetes:  - Be aware that D-mannose contains sugar.  Note for patients with interstitial cystitis (IC):  - Patients with IC should typically avoid cranberry/ PAC supplements and Vitamin C supplements due to their acidity, which may exacerbate IC-related bladder pain. - Symptoms of true bacterial UTI can overlap / mimic symptoms of an IC flare up. Antibiotic use is NOT indicated for IC flare ups. Urine culture needed prior to antibiotic treatment for IC patients. The goal is to minimize your risk for developing antibiotic-resistant bacteria.    Vulvovaginal atrophy I Genitourinary syndrome of menopause (GSM):  What it is: Changes in the vaginal environment (including the vulva and urethra) including: Thinning of the epithelium (skin/ mucosa surface) Can contribute to urinary urgency and frequency Can contribute to dryness, itching, irritation of the vulvar and vaginal tissue Can contribute to pain with intercourse Can contribute to physical changes of the labia, vulva, and vagina such as: Narrowing of the vaginal opening Decreased vaginal length Loss of labial architecture Labial adhesions Pale color of vulvovaginal tissue Loss of pubic hair Allows bacteria to become adherent  Results in increased risk for urinary tract infection (UTI) due to bacterial overgrowth and migration up the urethra into the bladder Change in vaginal pH (acid/ base balance) Allows for alteration / disruption of the normal bacterial flora / microbiome Results in increased risk for urinary tract infection (UTI) due to bacterial overgrowth  Treatment options: Over-the-counter lubricants (see list below). Prescription vaginal estrogen replacement. Options: Topical vaginal  estrogen  cream Estrace, Premarin, or compounded estradiol cream/ gel We advise: Discard plastic applicator as that tends to use more medication than you need, which is not harmful but wastes / uses up the medication. Also the plastic applicator may cause discomfort. Insert blueberry size amount of medication via the tip of your finger inside vagina nightly for 1 week then 2-3 times per week (long term). Estring vaginal ring Exchanged every 3 months (either at home or in office by provider) Vagifem vaginal tablet Inserted nightly for 2 weeks then twice a week (long term) lntrarosa vaginal suppository Vaginal DHEA: converts to estrogen in vaginal tissue without systemic effect Inserted nightly (long term) Vaginal laser therapy (Mona Lisa touch) Performed in 3 treatments each 6 weeks apart (available in our [place] office). Can feel like a sunburn for 3-4 days after each treatment until new skin heals in. Usually not covered by insurance. Estimated cost is $1500 for all 3 sessions.  FYI regarding prescription vaginal estrogen treatment options: All topical vaginal estrogen replacement options are equivalent in terms of efficacy. Topical vaginal estrogen replacement will take about 3 months to be effective. OK to have sex with any of the topical vaginal estrogen replacement options. Topical vaginal estrogen replacement may sting/burn initially due to severe dryness, which will improve with ongoing treatment. There have been studies that evaluate use of low-dose intravaginal estrogen that show minimal systemic absorption which is negligible after 3 weeks. There have been no studies indicating increased risk of contributing to cancer development or recurrence.  Topical vaginal estrogen cream safe to use with breast cancer history [email or web address]  Topical vaginal estrogen cream safe to use with blood clot  history [email or web address]   Lubricants and Moisturizers for Treating Genitourinary Syndrome of Menopause and Vulvovaginal Atrophy Treatment Comments I Available Products   Lubricants   Water-based Ingredients: Deionized water, glycerin, propylene glycol; latex safe; rare irritation; dry out with extended sexual activity Astroglide, Good Clean Love, K-Y Jelly, Natural, Organic, Pink, Sliquid, Sylk, Yes    Oil Based Ingredients: avocado, olive, peanut, corn; latex safe; can be used with silicone products; staining; safe (unless peanut allergy); non-irritating Coconut oil, vegetable oil, vitamin E oil  Silicone-Based Ingredients: Silicone polymers; staining; typically nonirritating, long lasting; waterproof; should not be used with silicone dilators, sexual toys, or gynecologic products Astroglide X, Oceanus Ultra Pure, Pink Silicone, Pjur Eros, Replens Silky Smooth, Silicone Premium JO, SKYN, Uberlube, [employer] Based Minimize harm to sperm motility; designed Astroglide TTC, Conceive Plus, Pre for couples trying to conceive Seed, Yes Baby  Fertility Friendly Minimize harm to sperm motility; designed Astroglide, TTC, Conceive Plus, Pre for couples trying to conceive Seed, Yes Baby  Vaginal Moisturizers   Vaginal Moisturizers For maintenance use 1 to 3 times weekly; can benefit women with dryness, chafing with AOL, and recurrent vaginal infections irrespective of sexual activity timing Balance Active Menopause Vaginal Moisturizing Lubricant, Canesintima Intimate Moisturizer, Replens, Rephresh, Sylk Natural Intimate Moisturizer, Yes Vaginal Moisturizer  Hybrids Properties of both water and silicone-based products (combination of a vaginal lubricant and moisturizer); Non-irritating; good option for women with allergies and  sensitivities Lubrigyn, Luvena  Suppositories Hyaluronic acid to retain moisture Revaree  Vulvar Soothing Creams/Oils    Medicated CreamsP ain and burn relief; Ingredients: 4% Lidocaine, Aloe Vera gel Releveum ([place])  Non-Medicated Creams For anti-itch and moisture/maintenance; Ingredients: Coconut oil, Avocado oil, Shea Butter, Olive oil, Vitamin E Vajuvenate, Vmagic  Oils !For moisture/maintenance !Coconut oil, Vitamin E oil, Emu oil

## 2023-04-28 ENCOUNTER — Encounter: Payer: Self-pay | Admitting: Family Medicine

## 2023-04-28 ENCOUNTER — Other Ambulatory Visit: Payer: Self-pay | Admitting: Urology

## 2023-04-28 DIAGNOSIS — N390 Urinary tract infection, site not specified: Secondary | ICD-10-CM

## 2023-04-28 LAB — URINE CULTURE

## 2023-04-28 MED ORDER — CIPROFLOXACIN HCL 500 MG PO TABS
500.0000 mg | ORAL_TABLET | Freq: Every day | ORAL | 0 refills | Status: AC
Start: 2023-04-28 — End: 2023-05-05

## 2023-05-01 ENCOUNTER — Telehealth: Payer: Self-pay

## 2023-05-01 DIAGNOSIS — K219 Gastro-esophageal reflux disease without esophagitis: Secondary | ICD-10-CM | POA: Diagnosis not present

## 2023-05-01 DIAGNOSIS — R296 Repeated falls: Secondary | ICD-10-CM | POA: Diagnosis not present

## 2023-05-01 DIAGNOSIS — F419 Anxiety disorder, unspecified: Secondary | ICD-10-CM | POA: Diagnosis not present

## 2023-05-01 DIAGNOSIS — N1832 Chronic kidney disease, stage 3b: Secondary | ICD-10-CM | POA: Diagnosis not present

## 2023-05-01 DIAGNOSIS — M810 Age-related osteoporosis without current pathological fracture: Secondary | ICD-10-CM | POA: Diagnosis not present

## 2023-05-01 DIAGNOSIS — I480 Paroxysmal atrial fibrillation: Secondary | ICD-10-CM | POA: Diagnosis not present

## 2023-05-01 DIAGNOSIS — J449 Chronic obstructive pulmonary disease, unspecified: Secondary | ICD-10-CM | POA: Diagnosis not present

## 2023-05-01 DIAGNOSIS — E785 Hyperlipidemia, unspecified: Secondary | ICD-10-CM | POA: Diagnosis not present

## 2023-05-01 DIAGNOSIS — M25561 Pain in right knee: Secondary | ICD-10-CM | POA: Diagnosis not present

## 2023-05-01 DIAGNOSIS — M4800 Spinal stenosis, site unspecified: Secondary | ICD-10-CM | POA: Diagnosis not present

## 2023-05-01 DIAGNOSIS — I129 Hypertensive chronic kidney disease with stage 1 through stage 4 chronic kidney disease, or unspecified chronic kidney disease: Secondary | ICD-10-CM | POA: Diagnosis not present

## 2023-05-01 DIAGNOSIS — M5431 Sciatica, right side: Secondary | ICD-10-CM | POA: Diagnosis not present

## 2023-05-01 DIAGNOSIS — N390 Urinary tract infection, site not specified: Secondary | ICD-10-CM | POA: Diagnosis not present

## 2023-05-01 DIAGNOSIS — E1143 Type 2 diabetes mellitus with diabetic autonomic (poly)neuropathy: Secondary | ICD-10-CM | POA: Diagnosis not present

## 2023-05-01 DIAGNOSIS — G894 Chronic pain syndrome: Secondary | ICD-10-CM | POA: Diagnosis not present

## 2023-05-01 DIAGNOSIS — E1122 Type 2 diabetes mellitus with diabetic chronic kidney disease: Secondary | ICD-10-CM | POA: Diagnosis not present

## 2023-05-01 DIAGNOSIS — F329 Major depressive disorder, single episode, unspecified: Secondary | ICD-10-CM | POA: Diagnosis not present

## 2023-05-01 NOTE — Telephone Encounter (Signed)
[facility] wants clearance that its okay to take Cipro with the reaction of potassium supplement and lisinopril.

## 2023-05-01 NOTE — Telephone Encounter (Signed)
Pt facility was called to verify Pt could take Cipro

## 2023-05-01 NOTE — Telephone Encounter (Signed)
Pt facility notified

## 2023-05-02 DIAGNOSIS — F411 Generalized anxiety disorder: Secondary | ICD-10-CM | POA: Diagnosis not present

## 2023-05-02 DIAGNOSIS — F331 Major depressive disorder, recurrent, moderate: Secondary | ICD-10-CM | POA: Diagnosis not present

## 2023-05-04 DIAGNOSIS — J449 Chronic obstructive pulmonary disease, unspecified: Secondary | ICD-10-CM | POA: Diagnosis not present

## 2023-05-04 DIAGNOSIS — N390 Urinary tract infection, site not specified: Secondary | ICD-10-CM | POA: Diagnosis not present

## 2023-05-04 DIAGNOSIS — R296 Repeated falls: Secondary | ICD-10-CM | POA: Diagnosis not present

## 2023-05-04 DIAGNOSIS — F329 Major depressive disorder, single episode, unspecified: Secondary | ICD-10-CM | POA: Diagnosis not present

## 2023-05-04 DIAGNOSIS — I480 Paroxysmal atrial fibrillation: Secondary | ICD-10-CM | POA: Diagnosis not present

## 2023-05-04 DIAGNOSIS — M4800 Spinal stenosis, site unspecified: Secondary | ICD-10-CM | POA: Diagnosis not present

## 2023-05-04 DIAGNOSIS — E1143 Type 2 diabetes mellitus with diabetic autonomic (poly)neuropathy: Secondary | ICD-10-CM | POA: Diagnosis not present

## 2023-05-04 DIAGNOSIS — F419 Anxiety disorder, unspecified: Secondary | ICD-10-CM | POA: Diagnosis not present

## 2023-05-04 DIAGNOSIS — E785 Hyperlipidemia, unspecified: Secondary | ICD-10-CM | POA: Diagnosis not present

## 2023-05-04 DIAGNOSIS — I129 Hypertensive chronic kidney disease with stage 1 through stage 4 chronic kidney disease, or unspecified chronic kidney disease: Secondary | ICD-10-CM | POA: Diagnosis not present

## 2023-05-04 DIAGNOSIS — E1122 Type 2 diabetes mellitus with diabetic chronic kidney disease: Secondary | ICD-10-CM | POA: Diagnosis not present

## 2023-05-04 DIAGNOSIS — G894 Chronic pain syndrome: Secondary | ICD-10-CM | POA: Diagnosis not present

## 2023-05-04 DIAGNOSIS — M5431 Sciatica, right side: Secondary | ICD-10-CM | POA: Diagnosis not present

## 2023-05-04 DIAGNOSIS — K219 Gastro-esophageal reflux disease without esophagitis: Secondary | ICD-10-CM | POA: Diagnosis not present

## 2023-05-04 DIAGNOSIS — M810 Age-related osteoporosis without current pathological fracture: Secondary | ICD-10-CM | POA: Diagnosis not present

## 2023-05-04 DIAGNOSIS — N1832 Chronic kidney disease, stage 3b: Secondary | ICD-10-CM | POA: Diagnosis not present

## 2023-05-05 DIAGNOSIS — J449 Chronic obstructive pulmonary disease, unspecified: Secondary | ICD-10-CM | POA: Diagnosis not present

## 2023-05-05 DIAGNOSIS — I10 Essential (primary) hypertension: Secondary | ICD-10-CM | POA: Diagnosis not present

## 2023-05-05 DIAGNOSIS — F339 Major depressive disorder, recurrent, unspecified: Secondary | ICD-10-CM | POA: Diagnosis not present

## 2023-05-05 DIAGNOSIS — M6281 Muscle weakness (generalized): Secondary | ICD-10-CM | POA: Diagnosis not present

## 2023-05-05 DIAGNOSIS — F5101 Primary insomnia: Secondary | ICD-10-CM | POA: Diagnosis not present

## 2023-05-05 DIAGNOSIS — F419 Anxiety disorder, unspecified: Secondary | ICD-10-CM | POA: Diagnosis not present

## 2023-05-08 DIAGNOSIS — U071 COVID-19: Secondary | ICD-10-CM | POA: Diagnosis not present

## 2023-05-09 DIAGNOSIS — F331 Major depressive disorder, recurrent, moderate: Secondary | ICD-10-CM | POA: Diagnosis not present

## 2023-05-10 DIAGNOSIS — R0602 Shortness of breath: Secondary | ICD-10-CM | POA: Diagnosis not present

## 2023-05-10 DIAGNOSIS — R0989 Other specified symptoms and signs involving the circulatory and respiratory systems: Secondary | ICD-10-CM | POA: Diagnosis not present

## 2023-05-15 DIAGNOSIS — Z634 Disappearance and death of family member: Secondary | ICD-10-CM | POA: Diagnosis not present

## 2023-05-15 DIAGNOSIS — F331 Major depressive disorder, recurrent, moderate: Secondary | ICD-10-CM | POA: Diagnosis not present

## 2023-05-15 DIAGNOSIS — R410 Disorientation, unspecified: Secondary | ICD-10-CM | POA: Diagnosis not present

## 2023-05-15 DIAGNOSIS — W010XXA Fall on same level from slipping, tripping and stumbling without subsequent striking against object, initial encounter: Secondary | ICD-10-CM | POA: Diagnosis not present

## 2023-05-15 DIAGNOSIS — U071 COVID-19: Secondary | ICD-10-CM | POA: Diagnosis not present

## 2023-05-18 DIAGNOSIS — F411 Generalized anxiety disorder: Secondary | ICD-10-CM | POA: Diagnosis not present

## 2023-05-18 DIAGNOSIS — E1122 Type 2 diabetes mellitus with diabetic chronic kidney disease: Secondary | ICD-10-CM | POA: Diagnosis not present

## 2023-05-18 DIAGNOSIS — M810 Age-related osteoporosis without current pathological fracture: Secondary | ICD-10-CM | POA: Diagnosis not present

## 2023-05-18 DIAGNOSIS — G894 Chronic pain syndrome: Secondary | ICD-10-CM | POA: Diagnosis not present

## 2023-05-18 DIAGNOSIS — N1832 Chronic kidney disease, stage 3b: Secondary | ICD-10-CM | POA: Diagnosis not present

## 2023-05-18 DIAGNOSIS — E1143 Type 2 diabetes mellitus with diabetic autonomic (poly)neuropathy: Secondary | ICD-10-CM | POA: Diagnosis not present

## 2023-05-18 DIAGNOSIS — F419 Anxiety disorder, unspecified: Secondary | ICD-10-CM | POA: Diagnosis not present

## 2023-05-18 DIAGNOSIS — M5431 Sciatica, right side: Secondary | ICD-10-CM | POA: Diagnosis not present

## 2023-05-18 DIAGNOSIS — F5101 Primary insomnia: Secondary | ICD-10-CM | POA: Diagnosis not present

## 2023-05-18 DIAGNOSIS — N390 Urinary tract infection, site not specified: Secondary | ICD-10-CM | POA: Diagnosis not present

## 2023-05-18 DIAGNOSIS — I480 Paroxysmal atrial fibrillation: Secondary | ICD-10-CM | POA: Diagnosis not present

## 2023-05-18 DIAGNOSIS — M4800 Spinal stenosis, site unspecified: Secondary | ICD-10-CM | POA: Diagnosis not present

## 2023-05-18 DIAGNOSIS — F331 Major depressive disorder, recurrent, moderate: Secondary | ICD-10-CM | POA: Diagnosis not present

## 2023-05-18 DIAGNOSIS — R296 Repeated falls: Secondary | ICD-10-CM | POA: Diagnosis not present

## 2023-05-18 DIAGNOSIS — I129 Hypertensive chronic kidney disease with stage 1 through stage 4 chronic kidney disease, or unspecified chronic kidney disease: Secondary | ICD-10-CM | POA: Diagnosis not present

## 2023-05-18 DIAGNOSIS — E785 Hyperlipidemia, unspecified: Secondary | ICD-10-CM | POA: Diagnosis not present

## 2023-05-18 DIAGNOSIS — F329 Major depressive disorder, single episode, unspecified: Secondary | ICD-10-CM | POA: Diagnosis not present

## 2023-05-18 DIAGNOSIS — U071 COVID-19: Secondary | ICD-10-CM | POA: Diagnosis not present

## 2023-05-18 DIAGNOSIS — K219 Gastro-esophageal reflux disease without esophagitis: Secondary | ICD-10-CM | POA: Diagnosis not present

## 2023-05-18 DIAGNOSIS — J449 Chronic obstructive pulmonary disease, unspecified: Secondary | ICD-10-CM | POA: Diagnosis not present

## 2023-05-19 DIAGNOSIS — J449 Chronic obstructive pulmonary disease, unspecified: Secondary | ICD-10-CM | POA: Diagnosis not present

## 2023-05-19 DIAGNOSIS — F419 Anxiety disorder, unspecified: Secondary | ICD-10-CM | POA: Diagnosis not present

## 2023-05-19 DIAGNOSIS — M4800 Spinal stenosis, site unspecified: Secondary | ICD-10-CM | POA: Diagnosis not present

## 2023-05-19 DIAGNOSIS — R296 Repeated falls: Secondary | ICD-10-CM | POA: Diagnosis not present

## 2023-05-19 DIAGNOSIS — E1122 Type 2 diabetes mellitus with diabetic chronic kidney disease: Secondary | ICD-10-CM | POA: Diagnosis not present

## 2023-05-19 DIAGNOSIS — M5431 Sciatica, right side: Secondary | ICD-10-CM | POA: Diagnosis not present

## 2023-05-19 DIAGNOSIS — N390 Urinary tract infection, site not specified: Secondary | ICD-10-CM | POA: Diagnosis not present

## 2023-05-19 DIAGNOSIS — N1832 Chronic kidney disease, stage 3b: Secondary | ICD-10-CM | POA: Diagnosis not present

## 2023-05-19 DIAGNOSIS — F329 Major depressive disorder, single episode, unspecified: Secondary | ICD-10-CM | POA: Diagnosis not present

## 2023-05-19 DIAGNOSIS — K219 Gastro-esophageal reflux disease without esophagitis: Secondary | ICD-10-CM | POA: Diagnosis not present

## 2023-05-19 DIAGNOSIS — E1143 Type 2 diabetes mellitus with diabetic autonomic (poly)neuropathy: Secondary | ICD-10-CM | POA: Diagnosis not present

## 2023-05-19 DIAGNOSIS — I129 Hypertensive chronic kidney disease with stage 1 through stage 4 chronic kidney disease, or unspecified chronic kidney disease: Secondary | ICD-10-CM | POA: Diagnosis not present

## 2023-05-19 DIAGNOSIS — G894 Chronic pain syndrome: Secondary | ICD-10-CM | POA: Diagnosis not present

## 2023-05-19 DIAGNOSIS — E785 Hyperlipidemia, unspecified: Secondary | ICD-10-CM | POA: Diagnosis not present

## 2023-05-19 DIAGNOSIS — M810 Age-related osteoporosis without current pathological fracture: Secondary | ICD-10-CM | POA: Diagnosis not present

## 2023-05-19 DIAGNOSIS — I480 Paroxysmal atrial fibrillation: Secondary | ICD-10-CM | POA: Diagnosis not present

## 2023-05-19 DIAGNOSIS — U071 COVID-19: Secondary | ICD-10-CM | POA: Diagnosis not present

## 2023-05-21 DIAGNOSIS — F339 Major depressive disorder, recurrent, unspecified: Secondary | ICD-10-CM | POA: Diagnosis not present

## 2023-05-21 DIAGNOSIS — E1143 Type 2 diabetes mellitus with diabetic autonomic (poly)neuropathy: Secondary | ICD-10-CM | POA: Diagnosis not present

## 2023-05-22 DIAGNOSIS — Z853 Personal history of malignant neoplasm of breast: Secondary | ICD-10-CM | POA: Diagnosis not present

## 2023-05-22 DIAGNOSIS — R296 Repeated falls: Secondary | ICD-10-CM | POA: Diagnosis not present

## 2023-05-22 DIAGNOSIS — E1143 Type 2 diabetes mellitus with diabetic autonomic (poly)neuropathy: Secondary | ICD-10-CM | POA: Diagnosis not present

## 2023-05-22 DIAGNOSIS — N1832 Chronic kidney disease, stage 3b: Secondary | ICD-10-CM | POA: Diagnosis not present

## 2023-05-22 DIAGNOSIS — Z7722 Contact with and (suspected) exposure to environmental tobacco smoke (acute) (chronic): Secondary | ICD-10-CM | POA: Diagnosis not present

## 2023-05-22 DIAGNOSIS — E039 Hypothyroidism, unspecified: Secondary | ICD-10-CM | POA: Diagnosis not present

## 2023-05-22 DIAGNOSIS — M25552 Pain in left hip: Secondary | ICD-10-CM | POA: Diagnosis not present

## 2023-05-22 DIAGNOSIS — F419 Anxiety disorder, unspecified: Secondary | ICD-10-CM | POA: Diagnosis not present

## 2023-05-22 DIAGNOSIS — I480 Paroxysmal atrial fibrillation: Secondary | ICD-10-CM | POA: Diagnosis not present

## 2023-05-22 DIAGNOSIS — U071 COVID-19: Secondary | ICD-10-CM | POA: Diagnosis not present

## 2023-05-22 DIAGNOSIS — Z79891 Long term (current) use of opiate analgesic: Secondary | ICD-10-CM | POA: Diagnosis not present

## 2023-05-22 DIAGNOSIS — M5431 Sciatica, right side: Secondary | ICD-10-CM | POA: Diagnosis not present

## 2023-05-22 DIAGNOSIS — Z634 Disappearance and death of family member: Secondary | ICD-10-CM | POA: Diagnosis not present

## 2023-05-22 DIAGNOSIS — J449 Chronic obstructive pulmonary disease, unspecified: Secondary | ICD-10-CM | POA: Diagnosis not present

## 2023-05-22 DIAGNOSIS — Z7952 Long term (current) use of systemic steroids: Secondary | ICD-10-CM | POA: Diagnosis not present

## 2023-05-22 DIAGNOSIS — E785 Hyperlipidemia, unspecified: Secondary | ICD-10-CM | POA: Diagnosis not present

## 2023-05-22 DIAGNOSIS — R410 Disorientation, unspecified: Secondary | ICD-10-CM | POA: Diagnosis not present

## 2023-05-22 DIAGNOSIS — R5381 Other malaise: Secondary | ICD-10-CM | POA: Diagnosis not present

## 2023-05-22 DIAGNOSIS — M810 Age-related osteoporosis without current pathological fracture: Secondary | ICD-10-CM | POA: Diagnosis not present

## 2023-05-22 DIAGNOSIS — E538 Deficiency of other specified B group vitamins: Secondary | ICD-10-CM | POA: Diagnosis not present

## 2023-05-22 DIAGNOSIS — Z8542 Personal history of malignant neoplasm of other parts of uterus: Secondary | ICD-10-CM | POA: Diagnosis not present

## 2023-05-22 DIAGNOSIS — Z556 Problems related to health literacy: Secondary | ICD-10-CM | POA: Diagnosis not present

## 2023-05-22 DIAGNOSIS — I129 Hypertensive chronic kidney disease with stage 1 through stage 4 chronic kidney disease, or unspecified chronic kidney disease: Secondary | ICD-10-CM | POA: Diagnosis not present

## 2023-05-22 DIAGNOSIS — F329 Major depressive disorder, single episode, unspecified: Secondary | ICD-10-CM | POA: Diagnosis not present

## 2023-05-22 DIAGNOSIS — G894 Chronic pain syndrome: Secondary | ICD-10-CM | POA: Diagnosis not present

## 2023-05-22 DIAGNOSIS — E1122 Type 2 diabetes mellitus with diabetic chronic kidney disease: Secondary | ICD-10-CM | POA: Diagnosis not present

## 2023-05-22 DIAGNOSIS — K219 Gastro-esophageal reflux disease without esophagitis: Secondary | ICD-10-CM | POA: Diagnosis not present

## 2023-05-22 DIAGNOSIS — M4800 Spinal stenosis, site unspecified: Secondary | ICD-10-CM | POA: Diagnosis not present

## 2023-05-22 DIAGNOSIS — M109 Gout, unspecified: Secondary | ICD-10-CM | POA: Diagnosis not present

## 2023-05-22 DIAGNOSIS — W010XXA Fall on same level from slipping, tripping and stumbling without subsequent striking against object, initial encounter: Secondary | ICD-10-CM | POA: Diagnosis not present

## 2023-05-22 DIAGNOSIS — Z9181 History of falling: Secondary | ICD-10-CM | POA: Diagnosis not present

## 2023-05-23 DIAGNOSIS — F331 Major depressive disorder, recurrent, moderate: Secondary | ICD-10-CM | POA: Diagnosis not present

## 2023-05-23 DIAGNOSIS — M25551 Pain in right hip: Secondary | ICD-10-CM | POA: Diagnosis not present

## 2023-05-23 DIAGNOSIS — M25552 Pain in left hip: Secondary | ICD-10-CM | POA: Diagnosis not present

## 2023-05-25 DIAGNOSIS — R410 Disorientation, unspecified: Secondary | ICD-10-CM | POA: Diagnosis not present

## 2023-05-25 DIAGNOSIS — N390 Urinary tract infection, site not specified: Secondary | ICD-10-CM | POA: Diagnosis not present

## 2023-05-26 DIAGNOSIS — J449 Chronic obstructive pulmonary disease, unspecified: Secondary | ICD-10-CM | POA: Diagnosis not present

## 2023-05-26 DIAGNOSIS — K219 Gastro-esophageal reflux disease without esophagitis: Secondary | ICD-10-CM | POA: Diagnosis not present

## 2023-05-26 DIAGNOSIS — E1122 Type 2 diabetes mellitus with diabetic chronic kidney disease: Secondary | ICD-10-CM | POA: Diagnosis not present

## 2023-05-26 DIAGNOSIS — M4800 Spinal stenosis, site unspecified: Secondary | ICD-10-CM | POA: Diagnosis not present

## 2023-05-26 DIAGNOSIS — R296 Repeated falls: Secondary | ICD-10-CM | POA: Diagnosis not present

## 2023-05-26 DIAGNOSIS — N1832 Chronic kidney disease, stage 3b: Secondary | ICD-10-CM | POA: Diagnosis not present

## 2023-05-26 DIAGNOSIS — I480 Paroxysmal atrial fibrillation: Secondary | ICD-10-CM | POA: Diagnosis not present

## 2023-05-26 DIAGNOSIS — F419 Anxiety disorder, unspecified: Secondary | ICD-10-CM | POA: Diagnosis not present

## 2023-05-26 DIAGNOSIS — M810 Age-related osteoporosis without current pathological fracture: Secondary | ICD-10-CM | POA: Diagnosis not present

## 2023-05-26 DIAGNOSIS — E1143 Type 2 diabetes mellitus with diabetic autonomic (poly)neuropathy: Secondary | ICD-10-CM | POA: Diagnosis not present

## 2023-05-26 DIAGNOSIS — E538 Deficiency of other specified B group vitamins: Secondary | ICD-10-CM | POA: Diagnosis not present

## 2023-05-26 DIAGNOSIS — E785 Hyperlipidemia, unspecified: Secondary | ICD-10-CM | POA: Diagnosis not present

## 2023-05-26 DIAGNOSIS — F329 Major depressive disorder, single episode, unspecified: Secondary | ICD-10-CM | POA: Diagnosis not present

## 2023-05-26 DIAGNOSIS — I129 Hypertensive chronic kidney disease with stage 1 through stage 4 chronic kidney disease, or unspecified chronic kidney disease: Secondary | ICD-10-CM | POA: Diagnosis not present

## 2023-05-26 DIAGNOSIS — M5431 Sciatica, right side: Secondary | ICD-10-CM | POA: Diagnosis not present

## 2023-05-26 DIAGNOSIS — G894 Chronic pain syndrome: Secondary | ICD-10-CM | POA: Diagnosis not present

## 2023-05-29 DIAGNOSIS — I480 Paroxysmal atrial fibrillation: Secondary | ICD-10-CM | POA: Diagnosis not present

## 2023-05-29 DIAGNOSIS — E1169 Type 2 diabetes mellitus with other specified complication: Secondary | ICD-10-CM | POA: Diagnosis not present

## 2023-05-29 DIAGNOSIS — R296 Repeated falls: Secondary | ICD-10-CM | POA: Diagnosis not present

## 2023-05-29 DIAGNOSIS — M2550 Pain in unspecified joint: Secondary | ICD-10-CM | POA: Diagnosis not present

## 2023-06-01 DIAGNOSIS — K219 Gastro-esophageal reflux disease without esophagitis: Secondary | ICD-10-CM | POA: Diagnosis not present

## 2023-06-01 DIAGNOSIS — M5431 Sciatica, right side: Secondary | ICD-10-CM | POA: Diagnosis not present

## 2023-06-01 DIAGNOSIS — R296 Repeated falls: Secondary | ICD-10-CM | POA: Diagnosis not present

## 2023-06-01 DIAGNOSIS — J449 Chronic obstructive pulmonary disease, unspecified: Secondary | ICD-10-CM | POA: Diagnosis not present

## 2023-06-01 DIAGNOSIS — G894 Chronic pain syndrome: Secondary | ICD-10-CM | POA: Diagnosis not present

## 2023-06-01 DIAGNOSIS — M810 Age-related osteoporosis without current pathological fracture: Secondary | ICD-10-CM | POA: Diagnosis not present

## 2023-06-01 DIAGNOSIS — E785 Hyperlipidemia, unspecified: Secondary | ICD-10-CM | POA: Diagnosis not present

## 2023-06-01 DIAGNOSIS — I480 Paroxysmal atrial fibrillation: Secondary | ICD-10-CM | POA: Diagnosis not present

## 2023-06-01 DIAGNOSIS — E1143 Type 2 diabetes mellitus with diabetic autonomic (poly)neuropathy: Secondary | ICD-10-CM | POA: Diagnosis not present

## 2023-06-01 DIAGNOSIS — E1122 Type 2 diabetes mellitus with diabetic chronic kidney disease: Secondary | ICD-10-CM | POA: Diagnosis not present

## 2023-06-01 DIAGNOSIS — F329 Major depressive disorder, single episode, unspecified: Secondary | ICD-10-CM | POA: Diagnosis not present

## 2023-06-01 DIAGNOSIS — F419 Anxiety disorder, unspecified: Secondary | ICD-10-CM | POA: Diagnosis not present

## 2023-06-01 DIAGNOSIS — I129 Hypertensive chronic kidney disease with stage 1 through stage 4 chronic kidney disease, or unspecified chronic kidney disease: Secondary | ICD-10-CM | POA: Diagnosis not present

## 2023-06-01 DIAGNOSIS — M4800 Spinal stenosis, site unspecified: Secondary | ICD-10-CM | POA: Diagnosis not present

## 2023-06-01 DIAGNOSIS — N1832 Chronic kidney disease, stage 3b: Secondary | ICD-10-CM | POA: Diagnosis not present

## 2023-06-01 DIAGNOSIS — E538 Deficiency of other specified B group vitamins: Secondary | ICD-10-CM | POA: Diagnosis not present

## 2023-06-01 NOTE — Progress Notes (Signed)
 Name: [name] DOB: [birth year] MRN: [id number]  History of Present Illness: [name] is a 89 y.o. female who presents today for follow up visit at [facility]. She resides at a long term care facility ([facility] [address] [facility] [place]) and is accompanied to clinic by Avel Leiter, Research scientist (physical sciences). Her granddaughter Elon Hakim [facility]) participated via speakerphone and assisted with providing history due to patient's dementia. - GU history: 1. Recurrent UTI. - Due to [name]' dementia it is difficult at times to discern whether she is experiencing symptomatic UTIs versus asymptomatic bacteriuria with vulvovaginal irritation. 2. Vaginal atrophy. 3. Renal cysts. - 10/24/2022: CT abdomen/pelvis w/o contrast showed "Atrophic right kidney. No hydronephrosis. Unchanged low-attenuation left renal lesions, likely simple cysts, no specific follow-up imaging is recommended."   Urine culture results in past 12 months: - 04/06/2022: Positive for Klebsiella pneumoniae - 05/24/2022: Negative - 09/15/2022: Positive for Klebsiella pneumoniae - 11/25/2022: Negative (at SNF) - 03/15/2023: Positive for >100k Citrobacter freundii & >100k Klebsiella pneumoniae (at [facility])   At last visit on 04/25/2023: - Discussed symptomatic UTIs versus asymptomatic bacteriuria with vulvovaginal irritation due to vaginal atrophy / GSM.  - Advised the following: 1. Requesting urinalysis & urine culture records from [facility]. 2. Urine culture (I&O cath'd specimen). 3. Start Trimethoprim  100 mg daily for UTI prevention (choice based on previous urine culture sensitivity reports & limited alternative options due to her multiple antibiotic drug allergies).  4. Continue taking D-mannose supplement daily. 5. Continue nightly use of topical vaginal estrogen cream as prescribed. Staff at [facility] are instructed to apply topical vaginal estrogen cream daily as prescribed (patient is not  to self-apply). Patient / family strongly prefer a female staff member for this. 6. Staff at [facility] are instructed to encourage hydration with water at least once every 2-4 hours every day while patient is awake.  7. Staff at [facility] are instructed to encourage patient to use the toilet at least once every 4-6 hours while awake and assist her to the toilet. 8. Staff at [facility] are instructed to cleanse patient's genital area at least once every 8 hours and change diaper if soiled.  9. Return in about 6 weeks (around 06/06/2023) for UA, PVR, & f/u with Griselda Lederer NP.  Since last visit: Urine culture from 04/25/2023 came back positive for Enterococcus cloacae complex. Treated with Cipro  500 mg daily for 7 days (renal dosing due to CKD) based on culture sensitivity report & patient's multiple antibiotic allergies.   She transferred from [facility] to [facility] about 1 week ago.  Today: [name] reports that her urinary symptoms are much improved compared to prior and denies acute UTI symptoms today. Denies urinary urgency, frequency, nocturia, dysuria, gross hematuria, hesitancy, straining to void, or sensations of incomplete emptying.   Her granddaughter reports that D-mannose was stopped by the family about a month ago and no adverse effects have been noticed following that discontinuation.   Per SNF med sheet she is taking Trimethoprim  100 mg daily and receiving topical vaginal estrogen cream nightly, which patient has been self-applying.   She reports being more proactive about daily hydration and routine toileting throughout the day, which she states she is mostly able to do on her own without assistance.   Her granddaughter reports that [name] has been able to reduce her daily opioid use and related need for laxatives which she was previously taking for opioid-induced constipation. Since discontinuing  the laxative she has had a notable decrease  in fecal incontinence episodes.    Fall Screening: Do you usually have a device to assist in your mobility? Yes   Medications: Current Outpatient Medications  Medication Sig Dispense Refill   allopurinol  (ZYLOPRIM ) 100 MG tablet TAKE (1/2) TABLET BY MOUTH DAILY. 15 tablet 6   ALPRAZolam  (XANAX ) 0.25 MG tablet 1/2 tablet taken once daily as needed for anxiety 15 tablet 0   carbamide peroxide (DEBROX) 6.5 % OTIC solution Place 5 drops into both ears 2 (two) times daily as needed (as needed for ear wax build up). 15 mL 0   diltiazem  (CARDIZEM  CD) 120 MG 24 hr capsule Take 1 capsule (120 mg total) by mouth daily. 90 capsule 1   gabapentin  (NEURONTIN ) 100 MG capsule 200 mg tid 90 capsule 2   HYDROcodone -acetaminophen  (NORCO/VICODIN) 5-325 MG tablet One half or 1 tablet taken once daily as needed maximum in a days time is 1 pill 20 tablet 0   ibuprofen  (ADVIL ) 800 MG tablet Take 1 tablet (800 mg total) by mouth every 8 (eight) hours as needed. 90 tablet 0   levothyroxine  (SYNTHROID ) 75 MCG tablet Take 1 tablet (75 mcg total) by mouth daily. 90 tablet 1   lisinopril  (ZESTRIL ) 2.5 MG tablet Take 1 tablet (2.5 mg total) by mouth daily. 90 tablet 1   melatonin 3 MG TABS tablet Take 3 mg by mouth at bedtime.     mupirocin  ointment (BACTROBAN ) 2 % Apply 1 Application topically 2 (two) times daily. 22 g 0   omeprazole  (PRILOSEC) 40 MG capsule Take 40 mg by mouth daily.     ondansetron  (ZOFRAN ) 4 MG tablet Take 1 tablet (4 mg total) by mouth every 8 (eight) hours as needed for nausea or vomiting. 20 tablet 0   ondansetron  (ZOFRAN -ODT) 4 MG disintegrating tablet Take 1 tablet (4 mg total) by mouth every 8 (eight) hours as needed for up to 10 doses for nausea or vomiting. 10 tablet 0   oxymetazoline (AFRIN) 0.05 % nasal spray Place 1 spray into both nostrils 2 (two) times daily.     pantoprazole  (PROTONIX ) 40 MG tablet TAKE 1 TABLET BY MOUTH ONCE DAILY. 90 tablet 1   polyethylene glycol powder  (GLYCOLAX /MIRALAX ) 17 GM/SCOOP powder Take 17 g by mouth daily.     potassium chloride  (KLOR-CON ) 10 MEQ tablet TAKE ONE TABLET BY MOUTH ONCE DAILY. 30 tablet 0   potassium chloride  SA (KLOR-CON  M) 20 MEQ tablet Take 20 mEq by mouth daily.     Propylene Glycol (SYSTANE COMPLETE OP) Apply 1 drop to eye daily as needed (dryness).     QC ACETAMINOPHEN  8 HOURS 650 MG CR tablet Take 1 tablet by mouth in the morning and at bedtime.     sertraline  (ZOLOFT ) 50 MG tablet Take 1 tablet (50 mg total) by mouth daily. 30 tablet 4   torsemide  (DEMADEX ) 20 MG tablet TAKE (1) TABLET BY MOUTH EVERY OTHER DAY. 45 tablet 1   trolamine salicylate (ASPERCREME) 10 % cream Apply 1 application topically as needed (feet and shoulders).     Cyanocobalamin  (B-12) 1000 MCG TABS  (Patient not taking: Reported on 06/07/2023)     DAILY MULTIPLE VITAMINS PO TAKE ONE TABLET BY MOUTHSONCE DAILY. (Patient not taking: Reported on 06/07/2023)     estradiol  (ESTRACE ) 0.1 MG/GM vaginal cream Discard plastic applicator. LTC staff are to insert a blueberry size amount (approximately 1 gram) of cream on gloved fingertip 1 cm inside patient vagina  every night at bedtime. For long term use. 30 g 3   Multiple Vitamin (MULTIVITAMIN WITH MINERALS) TABS tablet Take 1 tablet by mouth daily. (Patient not taking: Reported on 06/07/2023)     trimethoprim  (TRIMPEX ) 100 MG tablet Take 1 tablet (100 mg total) by mouth daily. 30 tablet 11   vitamin B-12 1000 MCG tablet Take 1 tablet (1,000 mcg total) by mouth daily. (Patient not taking: Reported on 06/07/2023) 30 tablet 1   No current facility-administered medications for this visit.    Allergies: Allergies  Allergen Reactions   Amiodarone  Itching   Cephalosporins Itching   Meloxicam  Itching   Robaxin  [Methocarbamol ] Itching   Amoxicillin Hives   Lovenox [Enoxaparin Sodium] Nausea And Vomiting   Morphine Other (See Comments)    Unknown Other reaction(s): Not available   Morphine And Codeine  Itching   Penicillins Swelling        Sulfa Antibiotics Swelling   Lipitor [Atorvastatin] Other (See Comments)    Unknown:patient is not aware of this allergy   Lorazepam  Other (See Comments)    Exceptional fatigue    Macrobid  [Nitrofurantoin ] Other (See Comments)    Constipation   Metoprolol      hallucinations    Past Medical History:  Diagnosis Date   Abdominal pain, chronic, right lower quadrant    "ever since I had my appendix removed"   Anxiety    Atrial fibrillation (HCC)    Breast cancer (HCC)    Cancer (HCC)    left side   Chronic back pain    Edema    GERD (gastroesophageal reflux disease)    Hyperlipidemia    Hypertension    Osteopenia    Osteoporosis    Ovarian cyst 02/2015   left   Recurrent abdominal pain    LLQ   Past Surgical History:  Procedure Laterality Date   APPENDECTOMY     APPENDECTOMY     BREAST SURGERY Left    lumpectomy   CARDIOVERSION N/A 03/31/2016   Procedure: CARDIOVERSION;  Surgeon: Loyde Rule, MD;  Location: [facility];  Service: Cardiovascular;  Laterality: N/A;   COLON RESECTION     COLON SURGERY     HERNIA REPAIR     HERNIA REPAIR     Family History  Problem Relation Age of Onset   Diabetes Mother    Heart disease Other    Arthritis Other    Cancer Other    Hyperlipidemia Brother    Social History   Socioeconomic History   Marital status: Widowed    Spouse name: Not on file   Number of children: 1   Years of education: 69   Highest education level: 12th grade  Occupational History    Employer: RETIRED  Tobacco Use   Smoking status: Never    Passive exposure: Never   Smokeless tobacco: Never  Vaping Use   Vaping status: Never Used  Substance and Sexual Activity   Alcohol  use: No    Alcohol /week: 0.0 standard drinks of alcohol    Drug use: No   Sexual activity: Not Currently    Partners: Male  Other Topics Concern   Not on file  Social History Narrative   Not on file   Social Drivers of Health    Financial Resource Strain: Low Risk  (08/24/2022)   Overall Financial Resource Strain (CARDIA)    Difficulty of Paying Living Expenses: Not hard at all  Food Insecurity: No Food Insecurity (08/24/2022)   Hunger Vital Sign  Worried About Programme researcher, broadcasting/film/video in the Last Year: Never true    Ran Out of Food in the Last Year: Never true  Transportation Needs: No Transportation Needs (08/24/2022)   PRAPARE - Administrator, Civil Service (Medical): No    Lack of Transportation (Non-Medical): No  Physical Activity: Inactive (08/24/2022)   Exercise Vital Sign    Days of Exercise per Week: 0 days    Minutes of Exercise per Session: 0 min  Stress: No Stress Concern Present (08/24/2022)   [employer] of Occupational Health - Occupational Stress Questionnaire    Feeling of Stress : Only a little  Social Connections: Moderately Integrated (08/24/2022)   Social Connection and Isolation Panel [NHANES]    Frequency of Communication with Friends and Family: More than three times a week    Frequency of Social Gatherings with Friends and Family: More than three times a week    Attends Religious Services: More than 4 times per year    Active Member of [employer] or Organizations: Yes    Attends Banker Meetings: More than 4 times per year    Marital Status: Widowed  Intimate Partner Violence: Not At Risk (08/24/2022)   Humiliation, Afraid, Rape, and Kick questionnaire    Fear of Current or Ex-Partner: No    Emotionally Abused: No    Physically Abused: No    Sexually Abused: No    Review of Systems Constitutional: Patient denies any change in strength lntegumentary: Patient denies any rashes or pruritus Cardiovascular: Patient denies chest pain or syncope Respiratory: Patient denies shortness of breath Gastrointestinal: As per HPI  Musculoskeletal: Patient denies muscle cramps or weakness Hematologic/Lymphatic: Patient denies bleeding tendencies  GU: As per  HPI.  OBJECTIVE Vitals:   06/07/23 1405  BP: 110/60  Pulse: 71  Temp: 98.4 F (36.9 C)   There is no height or weight on file to calculate BMI.  Physical Examination Constitutional: No obvious distress; patient is non-toxic appearing  Cardiovascular: No visible lower extremity edema.  Respiratory: The patient does not have audible wheezing/stridor; respirations do not appear labored  Gastrointestinal: Abdomen non-distended Musculoskeletal: Normal ROM of UEs  Skin: No obvious rashes/open sores  Neurologic: CN 2-12 grossly intact Psychiatric: Answered questions appropriately with normal affect  Hematologic/Lymphatic/Immunologic: No obvious bruises or sites of spontaneous bleeding  Voided urine microscopy: 6-10 WBC/hpf, otherwise unremarkable PVR: 62 ml  ASSESSMENT Recurrent UTI - Plan: Urinalysis, Routine w reflex microscopic, BLADDER SCAN AMB NON-IMAGING, estradiol  (ESTRACE ) 0.1 MG/GM vaginal cream, trimethoprim  (TRIMPEX ) 100 MG tablet  Atrophic vaginitis - Plan: estradiol  (ESTRACE ) 0.1 MG/GM vaginal cream  Urinary incontinence, unspecified type  Self-care deficit for hygiene  Dementia, unspecified dementia severity, unspecified dementia type, unspecified whether behavioral, psychotic, or mood disturbance or anxiety (HCC)  Ambulatory dysfunction  We reviewed history and findings in detail. She is doing well with her symptoms much improved. Her UTI risk is considered to be greatly reduced at this time compared to prior thanks to her decreased fecal incontinence, topical vaginal estrogen cream, and daily Trimethoprim  for UTI prevention. We discussed D-mannose discontinuation, which is unlikely to be a problem for her however patient / granddaughter were advised to notify provider if they want to restart that at any point (LTC facility would need provider order to administer OTC supplement).   We discussed that due to her dementia [name] is not considered to be reliable for  appropriate self-application of her nightly topical vaginal estrogen cream, therefore a staff  member at [facility] will be instructed to apply that for her (patient & family strongly prefer a female team member for that).    Printed orders for Trimethoprim  100 mg daily and topical vaginal estrogen cream provided to patient / [name] today since she is in the process of moving her prescriptions to a new pharmacy.   Will plan for follow up in 6 months or sooner if needed. Pt verbalized understanding and agreement. All questions were answered.  PLAN Advised the following: 1. Continue Trimethoprim  100 mg daily. 2. Continue nightly use of topical vaginal estrogen cream as prescribed. Staff at [facility] are instructed to apply topical vaginal estrogen cream daily as prescribed (patient is not to self-apply). Patient / family strongly prefer a female staff member for this. 3. Staff at [facility] are instructed to encourage hydration with water at least once every 2-4 hours every day while patient is awake.  4. Staff at [facility] are instructed to encourage patient to use the toilet at least once every 4-6 hours while awake and assist her to the toilet as needed. 5. Return in about 6 months (around 12/05/2023) for UA, PVR, & f/u with Griselda Lederer NP (Tuesday or Thursday preferred).  Orders Placed This Encounter  Procedures   Urinalysis, Routine w reflex microscopic   BLADDER SCAN AMB NON-IMAGING   Total time spent caring for the patient today was over 45 minutes. This includes time spent on the date of the visit reviewing the patient's chart before the visit, time spent during the visit, and time spent after the visit on documentation. Over 50% of that time was spent in face-to-face time with this patient for direct counseling. E&M based on time and complexity of medical decision making.  It has been explained that the patient is to  follow regularly with their PCP in addition to all other providers involved in their care and to follow instructions provided by these respective offices. Patient advised to contact urology clinic if any urologic-pertaining questions, concerns, new symptoms or problems arise in the interim period.  There are no Patient Instructions on file for this visit.  Electronically signed by:  Lauretta Ponto, FNP   06/07/23    3:01 PM

## 2023-06-02 NOTE — Telephone Encounter (Signed)
 Nurses Please assist with setting up a standard office visit with me this coming week or the following week  Also if family has forms or if [facility] have forms that they need filled out to forward those to us   We are more than please to take [name] back as a Administrator, Civil Service. [place]

## 2023-06-05 DIAGNOSIS — J449 Chronic obstructive pulmonary disease, unspecified: Secondary | ICD-10-CM | POA: Diagnosis not present

## 2023-06-05 DIAGNOSIS — F5101 Primary insomnia: Secondary | ICD-10-CM | POA: Diagnosis not present

## 2023-06-05 DIAGNOSIS — I10 Essential (primary) hypertension: Secondary | ICD-10-CM | POA: Diagnosis not present

## 2023-06-05 DIAGNOSIS — M6281 Muscle weakness (generalized): Secondary | ICD-10-CM | POA: Diagnosis not present

## 2023-06-06 ENCOUNTER — Telehealth: Payer: Self-pay | Admitting: General Practice

## 2023-06-06 NOTE — Telephone Encounter (Signed)
 Copied from CRM [id number]. Topic: Clinical - Prescription Issue >> Jun 06, 2023  3:21 PM Nestora J wrote: Reason for CRM: Chasity from [facility] is asking for a new prescription for ALPRAZolam  (XANAX ) 0.25 MG tablet [[id number]] be sent over to their facility for the pt. She states the pt moved into the nursing home on 01/09. She states that the prescription can be faxed to [phone] and you can follow up at [phone]

## 2023-06-07 ENCOUNTER — Ambulatory Visit (INDEPENDENT_AMBULATORY_CARE_PROVIDER_SITE_OTHER): Payer: Medicare Other | Admitting: Urology

## 2023-06-07 ENCOUNTER — Encounter: Payer: Self-pay | Admitting: Urology

## 2023-06-07 VITALS — BP 110/60 | HR 71 | Temp 98.4°F

## 2023-06-07 DIAGNOSIS — R32 Unspecified urinary incontinence: Secondary | ICD-10-CM | POA: Diagnosis not present

## 2023-06-07 DIAGNOSIS — N390 Urinary tract infection, site not specified: Secondary | ICD-10-CM | POA: Diagnosis not present

## 2023-06-07 DIAGNOSIS — Z741 Need for assistance with personal care: Secondary | ICD-10-CM

## 2023-06-07 DIAGNOSIS — N952 Postmenopausal atrophic vaginitis: Secondary | ICD-10-CM | POA: Diagnosis not present

## 2023-06-07 DIAGNOSIS — R262 Difficulty in walking, not elsewhere classified: Secondary | ICD-10-CM

## 2023-06-07 DIAGNOSIS — F0390 Unspecified dementia without behavioral disturbance: Secondary | ICD-10-CM

## 2023-06-07 DIAGNOSIS — Z87440 Personal history of urinary (tract) infections: Secondary | ICD-10-CM | POA: Diagnosis not present

## 2023-06-07 DIAGNOSIS — I129 Hypertensive chronic kidney disease with stage 1 through stage 4 chronic kidney disease, or unspecified chronic kidney disease: Secondary | ICD-10-CM | POA: Diagnosis not present

## 2023-06-07 LAB — URINALYSIS, ROUTINE W REFLEX MICROSCOPIC
Bilirubin, UA: NEGATIVE
Glucose, UA: NEGATIVE
Ketones, UA: NEGATIVE
Nitrite, UA: NEGATIVE
Protein,UA: NEGATIVE
RBC, UA: NEGATIVE
Specific Gravity, UA: 1.015 (ref 1.005–1.030)
Urobilinogen, Ur: 0.2 mg/dL (ref 0.2–1.0)
pH, UA: 5.5 (ref 5.0–7.5)

## 2023-06-07 LAB — MICROSCOPIC EXAMINATION: Bacteria, UA: NONE SEEN

## 2023-06-07 MED ORDER — ESTRADIOL 0.1 MG/GM VA CREA: TOPICAL_CREAM | VAGINAL | 3 refills | Status: DC

## 2023-06-07 MED ORDER — TRIMETHOPRIM 100 MG PO TABS: 100.0000 mg | ORAL_TABLET | Freq: Every day | ORAL | 11 refills | Status: DC

## 2023-06-07 NOTE — Progress Notes (Signed)
PVR<[id number]

## 2023-06-08 ENCOUNTER — Telehealth: Payer: Self-pay

## 2023-06-08 ENCOUNTER — Other Ambulatory Visit: Payer: Self-pay | Admitting: Family Medicine

## 2023-06-08 DIAGNOSIS — J449 Chronic obstructive pulmonary disease, unspecified: Secondary | ICD-10-CM | POA: Diagnosis not present

## 2023-06-08 DIAGNOSIS — M4800 Spinal stenosis, site unspecified: Secondary | ICD-10-CM | POA: Diagnosis not present

## 2023-06-08 DIAGNOSIS — K219 Gastro-esophageal reflux disease without esophagitis: Secondary | ICD-10-CM | POA: Diagnosis not present

## 2023-06-08 DIAGNOSIS — N1832 Chronic kidney disease, stage 3b: Secondary | ICD-10-CM | POA: Diagnosis not present

## 2023-06-08 DIAGNOSIS — E785 Hyperlipidemia, unspecified: Secondary | ICD-10-CM | POA: Diagnosis not present

## 2023-06-08 DIAGNOSIS — F329 Major depressive disorder, single episode, unspecified: Secondary | ICD-10-CM | POA: Diagnosis not present

## 2023-06-08 DIAGNOSIS — I480 Paroxysmal atrial fibrillation: Secondary | ICD-10-CM | POA: Diagnosis not present

## 2023-06-08 DIAGNOSIS — G894 Chronic pain syndrome: Secondary | ICD-10-CM | POA: Diagnosis not present

## 2023-06-08 DIAGNOSIS — M5431 Sciatica, right side: Secondary | ICD-10-CM | POA: Diagnosis not present

## 2023-06-08 DIAGNOSIS — E1143 Type 2 diabetes mellitus with diabetic autonomic (poly)neuropathy: Secondary | ICD-10-CM | POA: Diagnosis not present

## 2023-06-08 DIAGNOSIS — I129 Hypertensive chronic kidney disease with stage 1 through stage 4 chronic kidney disease, or unspecified chronic kidney disease: Secondary | ICD-10-CM | POA: Diagnosis not present

## 2023-06-08 DIAGNOSIS — M810 Age-related osteoporosis without current pathological fracture: Secondary | ICD-10-CM | POA: Diagnosis not present

## 2023-06-08 DIAGNOSIS — E1122 Type 2 diabetes mellitus with diabetic chronic kidney disease: Secondary | ICD-10-CM | POA: Diagnosis not present

## 2023-06-08 DIAGNOSIS — F419 Anxiety disorder, unspecified: Secondary | ICD-10-CM | POA: Diagnosis not present

## 2023-06-08 DIAGNOSIS — E538 Deficiency of other specified B group vitamins: Secondary | ICD-10-CM | POA: Diagnosis not present

## 2023-06-08 DIAGNOSIS — R296 Repeated falls: Secondary | ICD-10-CM | POA: Diagnosis not present

## 2023-06-08 NOTE — Telephone Encounter (Signed)
Nurses She is reestablishing care on the 24th If she has been on alprazolam on a daily basis recently I would need to know If this is more just on an occasional basis I would recommend coming the 24th we can review all of her medicines and prescribe (When she was at [facility] she was under the care of of the nurse practitioner I do not know if they were prescribing her Xanax to take every single day or not-if the patient was on daily Xanax we would need to send in some to cover until we see her because it is not advisable to just stop taking alprazolam)(so another words you may need to call the person at her new facility [facility] to find this out or talk with granddaughter Hinda Kehr) Please work on the above let me know and thank you

## 2023-06-08 NOTE — Telephone Encounter (Signed)
Reason for CRM: Granddaughter is calling back regarding the request for a script fro Xanax to be sent over to the facility - see other note from 01/14 - Granddaughter can confirm that she was taking the Xanax daily and sometimes twice a day. They would like to know if Dr. Gerda Diss could give her a script for a few days until their visit on the 24th and she is also willing to speak with the provider or nurses regarding how she's been taking this medication and provide more details.

## 2023-06-08 NOTE — Telephone Encounter (Signed)
Confirmed from Granddaughter that Pt has been taking Xanax daily sometimes twice a day and would like a Rx to hold over until appt on the 24th. please advise

## 2023-06-09 NOTE — Telephone Encounter (Signed)
This is a controlled medicine. I can send this in via electronic to a pharmacy of their choice But under current guidelines benzodiazepines are not a prescription where we sign it and give a paper prescription because pharmacies will not fill that  I would imagine they either use a local pharmacy or have a pharmacy that is contracted with their facility that has a name address etc. and accepts electronic scripts? Please connect with the health care attendant at [facility] and find this out then I can send in the prescription

## 2023-06-09 NOTE — Telephone Encounter (Signed)
Spoke with pt granddaughter and confirmed they would like this sent in to [place]

## 2023-06-12 ENCOUNTER — Other Ambulatory Visit: Payer: Self-pay | Admitting: Family Medicine

## 2023-06-12 MED ORDER — ALPRAZOLAM 0.25 MG PO TABS: ORAL_TABLET | ORAL | 0 refills | Status: DC

## 2023-06-12 NOTE — Telephone Encounter (Signed)
Copied from CRM [id number]. Topic: Clinical - Medication Refill >> Jun 12, 2023  3:02 PM Dollene Primrose wrote: Most Recent Primary Care Visit:  Provider: Lilyan Punt A  Department: [facility]  Visit Type: OFFICE VISIT  Date: 09/14/2022  Medication: ALPRAZolam (XANAX) 0.25 MG tablet [[id number]]  Has the patient contacted their pharmacy? Yes-called PCP and pharmacy twice, nothing was sent (Agent: If no, request that the patient contact the pharmacy for the refill. If patient does not wish to contact the pharmacy document the reason why and proceed with request.) (Agent: If yes, when and what did the pharmacy advise?)  Is this the correct pharmacy for this prescription? yes If no, delete pharmacy and type the correct one.  This is the patient's preferred pharmacy:  [facility] - [place], [place] - [address] [place] [address] Phone: [phone] Fax: [phone]   Has the prescription been filled recently? yes  Is the patient out of the medication? Yes(Dementia patient, needs prn RX asap)  Has the patient been seen for an appointment in the last year OR does the patient have an upcoming appointment? yes  Can we respond through MyChart? yes  Agent: Please be advised that Rx refills may take up to 3 business days. We ask that you follow-up with your pharmacy.

## 2023-06-13 NOTE — Telephone Encounter (Signed)
Prescription was sent to [facility] on Monday thank you

## 2023-06-15 DIAGNOSIS — F329 Major depressive disorder, single episode, unspecified: Secondary | ICD-10-CM | POA: Diagnosis not present

## 2023-06-15 DIAGNOSIS — K219 Gastro-esophageal reflux disease without esophagitis: Secondary | ICD-10-CM | POA: Diagnosis not present

## 2023-06-15 DIAGNOSIS — M810 Age-related osteoporosis without current pathological fracture: Secondary | ICD-10-CM | POA: Diagnosis not present

## 2023-06-15 DIAGNOSIS — E1122 Type 2 diabetes mellitus with diabetic chronic kidney disease: Secondary | ICD-10-CM | POA: Diagnosis not present

## 2023-06-15 DIAGNOSIS — J449 Chronic obstructive pulmonary disease, unspecified: Secondary | ICD-10-CM | POA: Diagnosis not present

## 2023-06-15 DIAGNOSIS — E538 Deficiency of other specified B group vitamins: Secondary | ICD-10-CM | POA: Diagnosis not present

## 2023-06-15 DIAGNOSIS — E785 Hyperlipidemia, unspecified: Secondary | ICD-10-CM | POA: Diagnosis not present

## 2023-06-15 DIAGNOSIS — R296 Repeated falls: Secondary | ICD-10-CM | POA: Diagnosis not present

## 2023-06-15 DIAGNOSIS — I480 Paroxysmal atrial fibrillation: Secondary | ICD-10-CM | POA: Diagnosis not present

## 2023-06-15 DIAGNOSIS — M4800 Spinal stenosis, site unspecified: Secondary | ICD-10-CM | POA: Diagnosis not present

## 2023-06-15 DIAGNOSIS — N1832 Chronic kidney disease, stage 3b: Secondary | ICD-10-CM | POA: Diagnosis not present

## 2023-06-15 DIAGNOSIS — E1143 Type 2 diabetes mellitus with diabetic autonomic (poly)neuropathy: Secondary | ICD-10-CM | POA: Diagnosis not present

## 2023-06-15 DIAGNOSIS — F419 Anxiety disorder, unspecified: Secondary | ICD-10-CM | POA: Diagnosis not present

## 2023-06-15 DIAGNOSIS — G894 Chronic pain syndrome: Secondary | ICD-10-CM | POA: Diagnosis not present

## 2023-06-15 DIAGNOSIS — M5431 Sciatica, right side: Secondary | ICD-10-CM | POA: Diagnosis not present

## 2023-06-15 DIAGNOSIS — I129 Hypertensive chronic kidney disease with stage 1 through stage 4 chronic kidney disease, or unspecified chronic kidney disease: Secondary | ICD-10-CM | POA: Diagnosis not present

## 2023-06-16 ENCOUNTER — Ambulatory Visit (INDEPENDENT_AMBULATORY_CARE_PROVIDER_SITE_OTHER): Payer: Medicare Other | Admitting: Family Medicine

## 2023-06-16 VITALS — BP 97/68 | HR 93 | Temp 97.2°F | Ht 64.0 in

## 2023-06-16 DIAGNOSIS — F5101 Primary insomnia: Secondary | ICD-10-CM

## 2023-06-16 DIAGNOSIS — R7989 Other specified abnormal findings of blood chemistry: Secondary | ICD-10-CM | POA: Diagnosis not present

## 2023-06-16 DIAGNOSIS — H6122 Impacted cerumen, left ear: Secondary | ICD-10-CM

## 2023-06-16 DIAGNOSIS — R443 Hallucinations, unspecified: Secondary | ICD-10-CM

## 2023-06-16 DIAGNOSIS — F419 Anxiety disorder, unspecified: Secondary | ICD-10-CM | POA: Diagnosis not present

## 2023-06-16 DIAGNOSIS — Z79899 Other long term (current) drug therapy: Secondary | ICD-10-CM | POA: Diagnosis not present

## 2023-06-17 NOTE — Progress Notes (Signed)
Subjective:    Patient ID: [name], female    DOB: 1923/12/03, 89 y.o.   MRN: [id number]  Discussed the use of AI scribe software for clinical note transcription with the patient, who gave verbal consent to proceed. Patient is reestablishing.  She was a long-term patient here but then she was at a separate facility that was not under our medical care now she is currently at [facility] here in [place] History of Present Illness   The patient, with a history of bowel obstruction, reports improved bowel movements, going twice daily without any recent issues. She has been residing in a shared facility, where she has experienced some difficulties with staff and access to the shared bathroom. The patient also reports a persistent issue with impacted ear wax, causing discomfort and hearing difficulties.  The patient's family member reports ongoing issues with the patient's phone access and medication management at the facility. The patient has been experiencing back pain, for which she has requested pain medication, but reports that her requests have been denied. She also reports a fall from her bed, which was not communicated to the family by the facility.  The patient has been participating in some social activities at the facility, such as bingo, but reports difficulty due to failing eyesight. She also expresses discomfort around other residents. The patient's family member is working on arranging additional support for the patient, including visits from a familiar caregiver and resolving the phone issue.  The patient has been taking alprazolam for anxiety, but there have been issues with the dosing and timing of this medication at the facility. The patient's family member reports that the patient was previously on hydrocodone for pain management, but this medication does not appear to be currently administered at the facility. The patient has also been using estrogen cream for vaginal  atrophy.  The patient expresses a preference for a do-not-resuscitate order, indicating that she would not want to be revived in the event of a sudden heart attack or similar event. She also expresses a desire for pain management, but is aware of the limitations due to her other health issues.      Very nice patient.  It is certainly frustrating for her to deal with her declining health.  She is used to doing lots of physical activity now her right side is failing her she has chronic back pain she is wheelchair-bound most of the time also suffers with anxiety spells.  She is now living in assisted living.  It seems that she is in a relatively good place.  She has a granddaughter who watches out for her.  But it is difficult for the patient to adjust to lack of independence and family health.  I sympathize with the patient regarding what is going on with her.  But at the same time there is limitations regarding prescribing medications for 89 years of age with increased risk of injuries and problems associated with medications at that age  Review of Systems     Objective:    Physical Exam   HEENT: Impacted cerumen in the right ear, left ear without cerumen impaction. CHEST: Clear lung sounds. CARDIOVASCULAR: Normal heart sounds.     Extremities does not have any significant edema      Assessment & Plan:  Assessment and Plan    Chronic Pain Reports of back pain. Discussed the limitations of pain management options due to patient's age and underlying health issues. Anti-inflammatories are contraindicated due  to risk of kidney damage and GI bleed. Opioids are not recommended due to risk when combined with alprazolam. -Discontinue Gabapentin if not effective. -Continue Acetaminophen as needed for pain.  Anxiety Reports of anxiousness. Discussed the risks of combining alprazolam with opioids. Emphasized the importance of non-pharmacological interventions for anxiety during the day. -Continue  Alprazolam 1 dose at night and 1 dose during the day for panic attacks.  Impacted Ear Wax Reports of ear pain due to impacted ear wax. Discussed the need for micro-instrument removal by an ENT specialist. -Refer to ENT for ear wax removal.  General Health Maintenance -Order labs to check kidney function, sodium, and potassium levels. -Discuss Do Not Resuscitate (DNR) status with patient and family. Patient prefers to be DNR. -Encourage patient to participate in facility activities for social interaction.     1. Elevated serum creatinine (Primary) Renal Metabolic 7 has had chronic kidney disease in the past await the results it has been a period of time since she has had lab work with [place] she was at a different facility that was not under our care she is now reestablishing - Basic Metabolic Panel  2. High risk medication use Check lab work.  Nutritionally she seems to be doing fairly well - CBC with Differential - Hepatic Function Panel  3. Impacted cerumen of left ear She has a very firm impaction in the left ear we will refer to ENT for further evaluation - Ambulatory referral to ENT  4. Anxiety Does seem that patient does have some intermittent panic attacks.  They do not happen frequently but at times she gets overwhelmed with uncontrollable crying and these instances it would be reasonable to give her low-dose Xanax We did not discuss behavioral measures to try to keep the symptoms under control for the most part We will reassess her in several weeks if having ongoing troubles with this then consider other measures  5. Hallucinations She still has some intermittent hallucinations but not severe and not worrisome or alarming reassurance and redirection best approach currently  6. Primary insomnia Has very difficult time calming herself at night has tried multiple different things through the past several years at her previous facility the mental health nurse practitioner had her on  alprazolam low-dose at nighttime I am not opposed to this but I have discussed with the patient and with the granddaughter increased risk of falls but both of them feel for quality of life that they would like to continue this for now

## 2023-06-21 ENCOUNTER — Telehealth: Payer: Self-pay

## 2023-06-21 DIAGNOSIS — M5431 Sciatica, right side: Secondary | ICD-10-CM | POA: Diagnosis not present

## 2023-06-21 DIAGNOSIS — M810 Age-related osteoporosis without current pathological fracture: Secondary | ICD-10-CM | POA: Diagnosis not present

## 2023-06-21 DIAGNOSIS — R296 Repeated falls: Secondary | ICD-10-CM | POA: Diagnosis not present

## 2023-06-21 DIAGNOSIS — Z7722 Contact with and (suspected) exposure to environmental tobacco smoke (acute) (chronic): Secondary | ICD-10-CM | POA: Diagnosis not present

## 2023-06-21 DIAGNOSIS — E1143 Type 2 diabetes mellitus with diabetic autonomic (poly)neuropathy: Secondary | ICD-10-CM | POA: Diagnosis not present

## 2023-06-21 DIAGNOSIS — Z9181 History of falling: Secondary | ICD-10-CM | POA: Diagnosis not present

## 2023-06-21 DIAGNOSIS — I129 Hypertensive chronic kidney disease with stage 1 through stage 4 chronic kidney disease, or unspecified chronic kidney disease: Secondary | ICD-10-CM | POA: Diagnosis not present

## 2023-06-21 DIAGNOSIS — J449 Chronic obstructive pulmonary disease, unspecified: Secondary | ICD-10-CM | POA: Diagnosis not present

## 2023-06-21 DIAGNOSIS — E1122 Type 2 diabetes mellitus with diabetic chronic kidney disease: Secondary | ICD-10-CM | POA: Diagnosis not present

## 2023-06-21 DIAGNOSIS — E039 Hypothyroidism, unspecified: Secondary | ICD-10-CM | POA: Diagnosis not present

## 2023-06-21 DIAGNOSIS — M4800 Spinal stenosis, site unspecified: Secondary | ICD-10-CM | POA: Diagnosis not present

## 2023-06-21 DIAGNOSIS — Z8542 Personal history of malignant neoplasm of other parts of uterus: Secondary | ICD-10-CM | POA: Diagnosis not present

## 2023-06-21 DIAGNOSIS — K219 Gastro-esophageal reflux disease without esophagitis: Secondary | ICD-10-CM | POA: Diagnosis not present

## 2023-06-21 DIAGNOSIS — Z79891 Long term (current) use of opiate analgesic: Secondary | ICD-10-CM | POA: Diagnosis not present

## 2023-06-21 DIAGNOSIS — G894 Chronic pain syndrome: Secondary | ICD-10-CM | POA: Diagnosis not present

## 2023-06-21 DIAGNOSIS — E785 Hyperlipidemia, unspecified: Secondary | ICD-10-CM | POA: Diagnosis not present

## 2023-06-21 DIAGNOSIS — Z556 Problems related to health literacy: Secondary | ICD-10-CM | POA: Diagnosis not present

## 2023-06-21 DIAGNOSIS — F419 Anxiety disorder, unspecified: Secondary | ICD-10-CM | POA: Diagnosis not present

## 2023-06-21 DIAGNOSIS — M109 Gout, unspecified: Secondary | ICD-10-CM | POA: Diagnosis not present

## 2023-06-21 DIAGNOSIS — Z853 Personal history of malignant neoplasm of breast: Secondary | ICD-10-CM | POA: Diagnosis not present

## 2023-06-21 DIAGNOSIS — N1832 Chronic kidney disease, stage 3b: Secondary | ICD-10-CM | POA: Diagnosis not present

## 2023-06-21 DIAGNOSIS — I480 Paroxysmal atrial fibrillation: Secondary | ICD-10-CM | POA: Diagnosis not present

## 2023-06-21 DIAGNOSIS — F329 Major depressive disorder, single episode, unspecified: Secondary | ICD-10-CM | POA: Diagnosis not present

## 2023-06-21 DIAGNOSIS — Z7952 Long term (current) use of systemic steroids: Secondary | ICD-10-CM | POA: Diagnosis not present

## 2023-06-21 DIAGNOSIS — E538 Deficiency of other specified B group vitamins: Secondary | ICD-10-CM | POA: Diagnosis not present

## 2023-06-21 NOTE — Telephone Encounter (Signed)
Faxed d/c order to [facility] fax # [phone]   ph # [phone] ext [phone] attn [address] for medications

## 2023-06-22 ENCOUNTER — Telehealth: Payer: Self-pay | Admitting: Family Medicine

## 2023-06-22 DIAGNOSIS — H6042 Cholesteatoma of left external ear: Secondary | ICD-10-CM | POA: Insufficient documentation

## 2023-06-22 DIAGNOSIS — H6123 Impacted cerumen, bilateral: Secondary | ICD-10-CM | POA: Diagnosis not present

## 2023-06-22 NOTE — Telephone Encounter (Signed)
Patient was reestablished Gabapentin was stopped Apparently [facility] utilizes [employer] in [place]

## 2023-06-26 DIAGNOSIS — G894 Chronic pain syndrome: Secondary | ICD-10-CM | POA: Diagnosis not present

## 2023-06-26 DIAGNOSIS — E1122 Type 2 diabetes mellitus with diabetic chronic kidney disease: Secondary | ICD-10-CM | POA: Diagnosis not present

## 2023-06-26 DIAGNOSIS — F419 Anxiety disorder, unspecified: Secondary | ICD-10-CM | POA: Diagnosis not present

## 2023-06-26 DIAGNOSIS — K219 Gastro-esophageal reflux disease without esophagitis: Secondary | ICD-10-CM | POA: Diagnosis not present

## 2023-06-26 DIAGNOSIS — E785 Hyperlipidemia, unspecified: Secondary | ICD-10-CM | POA: Diagnosis not present

## 2023-06-26 DIAGNOSIS — E1143 Type 2 diabetes mellitus with diabetic autonomic (poly)neuropathy: Secondary | ICD-10-CM | POA: Diagnosis not present

## 2023-06-26 DIAGNOSIS — E538 Deficiency of other specified B group vitamins: Secondary | ICD-10-CM | POA: Diagnosis not present

## 2023-06-26 DIAGNOSIS — M5431 Sciatica, right side: Secondary | ICD-10-CM | POA: Diagnosis not present

## 2023-06-26 DIAGNOSIS — M4800 Spinal stenosis, site unspecified: Secondary | ICD-10-CM | POA: Diagnosis not present

## 2023-06-26 DIAGNOSIS — I129 Hypertensive chronic kidney disease with stage 1 through stage 4 chronic kidney disease, or unspecified chronic kidney disease: Secondary | ICD-10-CM | POA: Diagnosis not present

## 2023-06-26 DIAGNOSIS — I480 Paroxysmal atrial fibrillation: Secondary | ICD-10-CM | POA: Diagnosis not present

## 2023-06-26 DIAGNOSIS — M810 Age-related osteoporosis without current pathological fracture: Secondary | ICD-10-CM | POA: Diagnosis not present

## 2023-06-26 DIAGNOSIS — N1832 Chronic kidney disease, stage 3b: Secondary | ICD-10-CM | POA: Diagnosis not present

## 2023-06-26 DIAGNOSIS — J449 Chronic obstructive pulmonary disease, unspecified: Secondary | ICD-10-CM | POA: Diagnosis not present

## 2023-06-26 DIAGNOSIS — F329 Major depressive disorder, single episode, unspecified: Secondary | ICD-10-CM | POA: Diagnosis not present

## 2023-06-26 DIAGNOSIS — R296 Repeated falls: Secondary | ICD-10-CM | POA: Diagnosis not present

## 2023-06-27 ENCOUNTER — Other Ambulatory Visit: Payer: Self-pay | Admitting: Family Medicine

## 2023-06-29 ENCOUNTER — Inpatient Hospital Stay: Payer: Self-pay | Admitting: Family Medicine

## 2023-07-05 ENCOUNTER — Ambulatory Visit: Payer: Medicare Other | Admitting: Family Medicine

## 2023-07-05 ENCOUNTER — Encounter: Payer: Self-pay | Admitting: Family Medicine

## 2023-07-05 ENCOUNTER — Telehealth: Payer: Medicare Other | Admitting: Family Medicine

## 2023-07-05 DIAGNOSIS — F419 Anxiety disorder, unspecified: Secondary | ICD-10-CM | POA: Diagnosis not present

## 2023-07-05 DIAGNOSIS — F321 Major depressive disorder, single episode, moderate: Secondary | ICD-10-CM

## 2023-07-05 DIAGNOSIS — N390 Urinary tract infection, site not specified: Secondary | ICD-10-CM | POA: Diagnosis not present

## 2023-07-05 DIAGNOSIS — R296 Repeated falls: Secondary | ICD-10-CM | POA: Diagnosis not present

## 2023-07-05 DIAGNOSIS — E1143 Type 2 diabetes mellitus with diabetic autonomic (poly)neuropathy: Secondary | ICD-10-CM | POA: Diagnosis not present

## 2023-07-05 DIAGNOSIS — M4800 Spinal stenosis, site unspecified: Secondary | ICD-10-CM | POA: Diagnosis not present

## 2023-07-05 DIAGNOSIS — F32A Depression, unspecified: Secondary | ICD-10-CM | POA: Diagnosis not present

## 2023-07-05 DIAGNOSIS — Z79899 Other long term (current) drug therapy: Secondary | ICD-10-CM | POA: Diagnosis not present

## 2023-07-05 DIAGNOSIS — J449 Chronic obstructive pulmonary disease, unspecified: Secondary | ICD-10-CM | POA: Diagnosis not present

## 2023-07-05 DIAGNOSIS — F329 Major depressive disorder, single episode, unspecified: Secondary | ICD-10-CM | POA: Diagnosis not present

## 2023-07-05 DIAGNOSIS — R443 Hallucinations, unspecified: Secondary | ICD-10-CM | POA: Diagnosis not present

## 2023-07-05 DIAGNOSIS — M810 Age-related osteoporosis without current pathological fracture: Secondary | ICD-10-CM | POA: Diagnosis not present

## 2023-07-05 DIAGNOSIS — R7989 Other specified abnormal findings of blood chemistry: Secondary | ICD-10-CM | POA: Diagnosis not present

## 2023-07-05 DIAGNOSIS — K219 Gastro-esophageal reflux disease without esophagitis: Secondary | ICD-10-CM | POA: Diagnosis not present

## 2023-07-05 DIAGNOSIS — M5431 Sciatica, right side: Secondary | ICD-10-CM | POA: Diagnosis not present

## 2023-07-05 DIAGNOSIS — E1122 Type 2 diabetes mellitus with diabetic chronic kidney disease: Secondary | ICD-10-CM | POA: Diagnosis not present

## 2023-07-05 DIAGNOSIS — I129 Hypertensive chronic kidney disease with stage 1 through stage 4 chronic kidney disease, or unspecified chronic kidney disease: Secondary | ICD-10-CM | POA: Diagnosis not present

## 2023-07-05 DIAGNOSIS — E538 Deficiency of other specified B group vitamins: Secondary | ICD-10-CM | POA: Diagnosis not present

## 2023-07-05 DIAGNOSIS — N1832 Chronic kidney disease, stage 3b: Secondary | ICD-10-CM | POA: Diagnosis not present

## 2023-07-05 DIAGNOSIS — G894 Chronic pain syndrome: Secondary | ICD-10-CM | POA: Diagnosis not present

## 2023-07-05 DIAGNOSIS — I480 Paroxysmal atrial fibrillation: Secondary | ICD-10-CM | POA: Diagnosis not present

## 2023-07-05 DIAGNOSIS — E785 Hyperlipidemia, unspecified: Secondary | ICD-10-CM | POA: Diagnosis not present

## 2023-07-05 NOTE — Progress Notes (Signed)
   Subjective:    Patient ID: [name], female    DOB: Oct 05, 1923, 89 y.o.   MRN: [id number]  HPI Virtual Visit via Video Note Unable to connect via video I connected with [name] on 07/05/23 at  4:10 PM EST by a video enabled telemedicine application and verified that I am speaking with the correct person using two identifiers.  Location: Patient: At her care facility Provider: Office   I discussed the limitations of evaluation and management by telemedicine and the availability of in person appointments. The patient expressed understanding and agreed to proceed.  History of Present Illness:    Observations/Objective:   Assessment and Plan:   Follow Up Instructions:    I discussed the assessment and treatment plan with the patient. The patient was provided an opportunity to ask questions and all were answered. The patient agreed with the plan and demonstrated an understanding of the instructions.   The patient was advised to call back or seek an in-person evaluation if the symptoms worsen or if the condition fails to improve as anticipated.  I provided 15 minutes of non-face-to-face time during this encounter.   Lilyan Punt, MD    Review of Systems     Objective:   Physical Exam Today's visit was via telephone Physical exam was not possible for this visit        Assessment & Plan:  Depression-currently on sertraline we will bump up the dose to 1-1/2/day we will talk with her care facility to get this accomplished Xanax can use in the evening but I would avoid using it during the day to prevent over medicating If necessary use occasionally to help with panic attacks  Urine was requested by family because the hallucinations the hallucinations are innocent and not disturbing more than likely the hallucinations are related to her age no medications indicated currently  Per family request we will get a full readout of her medications to make sure it  reflects what is in epic  We are ordering a urinalysis and urine culture  Also we will look into the bed rails that they are asking for this patient

## 2023-07-06 ENCOUNTER — Telehealth: Payer: Self-pay

## 2023-07-06 ENCOUNTER — Other Ambulatory Visit: Payer: Self-pay

## 2023-07-06 ENCOUNTER — Encounter: Payer: Self-pay | Admitting: Family Medicine

## 2023-07-06 DIAGNOSIS — I10 Essential (primary) hypertension: Secondary | ICD-10-CM | POA: Diagnosis not present

## 2023-07-06 DIAGNOSIS — J449 Chronic obstructive pulmonary disease, unspecified: Secondary | ICD-10-CM | POA: Diagnosis not present

## 2023-07-06 DIAGNOSIS — F5101 Primary insomnia: Secondary | ICD-10-CM | POA: Diagnosis not present

## 2023-07-06 DIAGNOSIS — M6281 Muscle weakness (generalized): Secondary | ICD-10-CM | POA: Diagnosis not present

## 2023-07-06 DIAGNOSIS — N1832 Chronic kidney disease, stage 3b: Secondary | ICD-10-CM

## 2023-07-06 LAB — CBC WITH DIFFERENTIAL/PLATELET
Basophils Absolute: 0 10*3/uL (ref 0.0–0.2)
Basos: 0 %
EOS (ABSOLUTE): 0.1 10*3/uL (ref 0.0–0.4)
Eos: 1 %
Hematocrit: 36.4 % (ref 34.0–46.6)
Hemoglobin: 11.7 g/dL (ref 11.1–15.9)
Immature Grans (Abs): 0.1 10*3/uL (ref 0.0–0.1)
Immature Granulocytes: 1 %
Lymphocytes Absolute: 1.6 10*3/uL (ref 0.7–3.1)
Lymphs: 24 %
MCH: 28.8 pg (ref 26.6–33.0)
MCHC: 32.1 g/dL (ref 31.5–35.7)
MCV: 90 fL (ref 79–97)
Monocytes Absolute: 0.5 10*3/uL (ref 0.1–0.9)
Monocytes: 8 %
Neutrophils Absolute: 4.4 10*3/uL (ref 1.4–7.0)
Neutrophils: 66 %
Platelets: 397 10*3/uL (ref 150–450)
RBC: 4.06 x10E6/uL (ref 3.77–5.28)
RDW: 13.8 % (ref 11.7–15.4)
WBC: 6.7 10*3/uL (ref 3.4–10.8)

## 2023-07-06 LAB — HEPATIC FUNCTION PANEL
ALT: 11 [IU]/L (ref 0–32)
AST: 18 [IU]/L (ref 0–40)
Albumin: 4.3 g/dL (ref 3.6–4.6)
Alkaline Phosphatase: 103 [IU]/L (ref 44–121)
Bilirubin Total: 0.3 mg/dL (ref 0.0–1.2)
Bilirubin, Direct: 0.11 mg/dL (ref 0.00–0.40)
Total Protein: 7.1 g/dL (ref 6.0–8.5)

## 2023-07-06 LAB — BASIC METABOLIC PANEL
BUN/Creatinine Ratio: 17 (ref 12–28)
BUN: 39 mg/dL — ABNORMAL HIGH (ref 10–36)
CO2: 17 mmol/L — ABNORMAL LOW (ref 20–29)
Calcium: 9.3 mg/dL (ref 8.7–10.3)
Chloride: 106 mmol/L (ref 96–106)
Creatinine, Ser: 2.35 mg/dL — ABNORMAL HIGH (ref 0.57–1.00)
Glucose: 111 mg/dL — ABNORMAL HIGH (ref 70–99)
Potassium: 4.4 mmol/L (ref 3.5–5.2)
Sodium: 143 mmol/L (ref 134–144)
eGFR: 18 mL/min/{1.73_m2} — ABNORMAL LOW (ref >59)

## 2023-07-06 NOTE — Telephone Encounter (Signed)
Communication  Reason for CRM: Pharmacy Called states they need clarification on prescriptions received for patient. States they received an order for [employer] compound but need to know the active ingredients. [facility] Phone: [phone]

## 2023-07-07 ENCOUNTER — Other Ambulatory Visit: Payer: Self-pay

## 2023-07-07 DIAGNOSIS — B370 Candidal stomatitis: Secondary | ICD-10-CM

## 2023-07-07 MED ORDER — NYSTATIN 100000 UNIT/ML MT SUSP: OROMUCOSAL | 0 refills | Status: DC

## 2023-07-07 NOTE — Telephone Encounter (Signed)
Please change the order to nystatin oral solution 1 teaspoon swish and spit 3 times daily for 5 days thank you This is a standard prescription and does not require any compounding

## 2023-07-08 ENCOUNTER — Other Ambulatory Visit: Payer: Self-pay | Admitting: Family Medicine

## 2023-07-09 DIAGNOSIS — R1084 Generalized abdominal pain: Secondary | ICD-10-CM | POA: Diagnosis not present

## 2023-07-09 DIAGNOSIS — I10 Essential (primary) hypertension: Secondary | ICD-10-CM | POA: Diagnosis not present

## 2023-07-09 DIAGNOSIS — R600 Localized edema: Secondary | ICD-10-CM | POA: Diagnosis not present

## 2023-07-09 DIAGNOSIS — Z885 Allergy status to narcotic agent status: Secondary | ICD-10-CM | POA: Diagnosis not present

## 2023-07-09 DIAGNOSIS — Z882 Allergy status to sulfonamides status: Secondary | ICD-10-CM | POA: Diagnosis not present

## 2023-07-09 DIAGNOSIS — Z79891 Long term (current) use of opiate analgesic: Secondary | ICD-10-CM | POA: Diagnosis not present

## 2023-07-09 DIAGNOSIS — L02211 Cutaneous abscess of abdominal wall: Secondary | ICD-10-CM | POA: Diagnosis not present

## 2023-07-09 DIAGNOSIS — Z743 Need for continuous supervision: Secondary | ICD-10-CM | POA: Diagnosis not present

## 2023-07-09 DIAGNOSIS — R7989 Other specified abnormal findings of blood chemistry: Secondary | ICD-10-CM | POA: Diagnosis not present

## 2023-07-09 DIAGNOSIS — E039 Hypothyroidism, unspecified: Secondary | ICD-10-CM | POA: Diagnosis not present

## 2023-07-09 DIAGNOSIS — Z66 Do not resuscitate: Secondary | ICD-10-CM | POA: Diagnosis not present

## 2023-07-09 DIAGNOSIS — R1111 Vomiting without nausea: Secondary | ICD-10-CM | POA: Diagnosis not present

## 2023-07-09 DIAGNOSIS — K529 Noninfective gastroenteritis and colitis, unspecified: Secondary | ICD-10-CM | POA: Diagnosis not present

## 2023-07-09 DIAGNOSIS — M25561 Pain in right knee: Secondary | ICD-10-CM | POA: Diagnosis not present

## 2023-07-09 DIAGNOSIS — N179 Acute kidney failure, unspecified: Secondary | ICD-10-CM | POA: Diagnosis not present

## 2023-07-09 DIAGNOSIS — I272 Pulmonary hypertension, unspecified: Secondary | ICD-10-CM | POA: Diagnosis not present

## 2023-07-09 DIAGNOSIS — K439 Ventral hernia without obstruction or gangrene: Secondary | ICD-10-CM | POA: Diagnosis not present

## 2023-07-09 DIAGNOSIS — I480 Paroxysmal atrial fibrillation: Secondary | ICD-10-CM | POA: Diagnosis not present

## 2023-07-09 DIAGNOSIS — R109 Unspecified abdominal pain: Secondary | ICD-10-CM | POA: Diagnosis not present

## 2023-07-09 DIAGNOSIS — E860 Dehydration: Secondary | ICD-10-CM | POA: Diagnosis not present

## 2023-07-09 DIAGNOSIS — M25461 Effusion, right knee: Secondary | ICD-10-CM | POA: Diagnosis not present

## 2023-07-09 DIAGNOSIS — K91872 Postprocedural seroma of a digestive system organ or structure following a digestive system procedure: Secondary | ICD-10-CM | POA: Diagnosis not present

## 2023-07-09 DIAGNOSIS — Z79890 Hormone replacement therapy (postmenopausal): Secondary | ICD-10-CM | POA: Diagnosis not present

## 2023-07-09 DIAGNOSIS — K5750 Diverticulosis of both small and large intestine without perforation or abscess without bleeding: Secondary | ICD-10-CM | POA: Diagnosis not present

## 2023-07-09 DIAGNOSIS — I4892 Unspecified atrial flutter: Secondary | ICD-10-CM | POA: Diagnosis not present

## 2023-07-09 DIAGNOSIS — I959 Hypotension, unspecified: Secondary | ICD-10-CM | POA: Diagnosis not present

## 2023-07-09 DIAGNOSIS — Z880 Allergy status to penicillin: Secondary | ICD-10-CM | POA: Diagnosis not present

## 2023-07-09 DIAGNOSIS — Z7409 Other reduced mobility: Secondary | ICD-10-CM | POA: Diagnosis not present

## 2023-07-09 DIAGNOSIS — S2249XA Multiple fractures of ribs, unspecified side, initial encounter for closed fracture: Secondary | ICD-10-CM | POA: Diagnosis not present

## 2023-07-09 DIAGNOSIS — I4891 Unspecified atrial fibrillation: Secondary | ICD-10-CM | POA: Diagnosis not present

## 2023-07-09 DIAGNOSIS — E870 Hyperosmolality and hypernatremia: Secondary | ICD-10-CM | POA: Diagnosis not present

## 2023-07-09 DIAGNOSIS — I517 Cardiomegaly: Secondary | ICD-10-CM | POA: Diagnosis not present

## 2023-07-09 DIAGNOSIS — R188 Other ascites: Secondary | ICD-10-CM | POA: Diagnosis not present

## 2023-07-09 DIAGNOSIS — S301XXA Contusion of abdominal wall, initial encounter: Secondary | ICD-10-CM | POA: Diagnosis not present

## 2023-07-09 DIAGNOSIS — M109 Gout, unspecified: Secondary | ICD-10-CM | POA: Diagnosis not present

## 2023-07-09 DIAGNOSIS — L7634 Postprocedural seroma of skin and subcutaneous tissue following other procedure: Secondary | ICD-10-CM | POA: Diagnosis not present

## 2023-07-09 DIAGNOSIS — M1711 Unilateral primary osteoarthritis, right knee: Secondary | ICD-10-CM | POA: Diagnosis not present

## 2023-07-09 DIAGNOSIS — Z1152 Encounter for screening for COVID-19: Secondary | ICD-10-CM | POA: Diagnosis not present

## 2023-07-09 DIAGNOSIS — I340 Nonrheumatic mitral (valve) insufficiency: Secondary | ICD-10-CM | POA: Diagnosis not present

## 2023-07-09 DIAGNOSIS — R2681 Unsteadiness on feet: Secondary | ICD-10-CM | POA: Diagnosis not present

## 2023-07-09 DIAGNOSIS — K219 Gastro-esophageal reflux disease without esophagitis: Secondary | ICD-10-CM | POA: Diagnosis not present

## 2023-07-09 DIAGNOSIS — R1110 Vomiting, unspecified: Secondary | ICD-10-CM | POA: Diagnosis not present

## 2023-07-09 DIAGNOSIS — K5710 Diverticulosis of small intestine without perforation or abscess without bleeding: Secondary | ICD-10-CM | POA: Diagnosis not present

## 2023-07-09 DIAGNOSIS — R000 Tachycardia, unspecified: Secondary | ICD-10-CM | POA: Diagnosis not present

## 2023-07-09 DIAGNOSIS — I700 Atherosclerosis of aorta: Secondary | ICD-10-CM | POA: Diagnosis not present

## 2023-07-10 ENCOUNTER — Other Ambulatory Visit: Payer: Self-pay | Admitting: Family Medicine

## 2023-07-10 ENCOUNTER — Telehealth: Payer: Self-pay | Admitting: *Deleted

## 2023-07-10 ENCOUNTER — Encounter: Payer: Self-pay | Admitting: Family Medicine

## 2023-07-10 DIAGNOSIS — N390 Urinary tract infection, site not specified: Secondary | ICD-10-CM

## 2023-07-10 DIAGNOSIS — N952 Postmenopausal atrophic vaginitis: Secondary | ICD-10-CM

## 2023-07-10 MED ORDER — ESTRADIOL 0.1 MG/GM VA CREA: TOPICAL_CREAM | VAGINAL | 3 refills | Status: AC

## 2023-07-10 MED ORDER — SERTRALINE HCL 50 MG PO TABS: ORAL_TABLET | ORAL | 5 refills | Status: DC

## 2023-07-10 MED ORDER — OMEPRAZOLE 40 MG PO CPDR: 40.0000 mg | DELAYED_RELEASE_CAPSULE | Freq: Every day | ORAL | 5 refills | Status: DC

## 2023-07-10 NOTE — Telephone Encounter (Unsigned)
Copied from CRM [id number]. Topic: Clinical - Medication Question >> Jul 10, 2023  2:10 PM Kristie Cowman wrote: Reason for CRM: The patient's caregiver Rodman Pickle was calling regarding discontinuation of 4 of the patient's medications and for clarification of the Zoloft dose.  The caregiver was returning a call from the office.  I called the CAL and they said to reach out here and route to the clinical pool.  Caregiver - Rodman Pickle (best number)  [phone]

## 2023-07-13 ENCOUNTER — Encounter: Payer: Self-pay | Admitting: Family Medicine

## 2023-07-13 NOTE — Telephone Encounter (Signed)
Dr [name] spoke with granddaughter(DPR)

## 2023-07-13 NOTE — Telephone Encounter (Signed)
I did have a phone conversation with Chasity.  We reconciled the medication list.  This was completed on Monday.  We also discussed how when Ms. Nevers gets released from the hospital we will need to reconcile her medication list as quickly as possible.  We also discussed the preferred measure that her facility likes to do in order to make sure issues do not fall through the cracks  Then finally they utilize [facility] for her prescriptions

## 2023-07-17 ENCOUNTER — Telehealth: Payer: Self-pay

## 2023-07-17 ENCOUNTER — Ambulatory Visit: Payer: Medicare Other | Admitting: Family Medicine

## 2023-07-17 ENCOUNTER — Other Ambulatory Visit: Payer: Self-pay | Admitting: Family Medicine

## 2023-07-17 DIAGNOSIS — E038 Other specified hypothyroidism: Secondary | ICD-10-CM

## 2023-07-17 DIAGNOSIS — F5101 Primary insomnia: Secondary | ICD-10-CM

## 2023-07-17 DIAGNOSIS — Z79899 Other long term (current) drug therapy: Secondary | ICD-10-CM

## 2023-07-17 DIAGNOSIS — F419 Anxiety disorder, unspecified: Secondary | ICD-10-CM

## 2023-07-17 DIAGNOSIS — R000 Tachycardia, unspecified: Secondary | ICD-10-CM

## 2023-07-17 DIAGNOSIS — N1832 Chronic kidney disease, stage 3b: Secondary | ICD-10-CM

## 2023-07-17 MED ORDER — POTASSIUM CHLORIDE CRYS ER 10 MEQ PO TBCR
EXTENDED_RELEASE_TABLET | ORAL | 5 refills | Status: AC
Start: 2023-07-17 — End: ?

## 2023-07-17 MED ORDER — TORSEMIDE 20 MG PO TABS
ORAL_TABLET | ORAL | 5 refills | Status: DC
Start: 2023-07-17 — End: 2023-12-07

## 2023-07-17 MED ORDER — DILTIAZEM HCL ER COATED BEADS 180 MG PO CP24: 180.0000 mg | ORAL_CAPSULE | Freq: Every day | ORAL | 5 refills | Status: DC

## 2023-07-17 NOTE — Telephone Encounter (Unsigned)
 Copied from CRM [id number]. Topic: Clinical - Prescription Issue >> Jul 17, 2023 12:00 PM Prudencio Pair wrote: Reason for CRM: Chasity, with [facility], called stating that she spoke directly to Dr. Gerda Diss last week. She states pt is being discharged today & Dr. Gerda Diss told her that he would be temporarily stopping 4 of pt's medication. Chasity states she never received confirmation of those meds sent to her. States this is the issue that had pt hospitalized in the first place. She stated he told her that he would be changing one of her medications to twice a week. Please give Chasity a call to advise further. CB #: [phone].

## 2023-07-17 NOTE — Telephone Encounter (Signed)
 I was able to see the patient yesterday.  Please see if it is possible to put her on my patient's schedule list from yesterday

## 2023-07-17 NOTE — Telephone Encounter (Signed)
 There is a message that was received from Chasity from [facility] asking regarding any medication changes that were discussed at last conversation , please see note received from [facility], please advise

## 2023-07-18 ENCOUNTER — Other Ambulatory Visit: Payer: Self-pay | Admitting: Family Medicine

## 2023-07-18 DIAGNOSIS — I129 Hypertensive chronic kidney disease with stage 1 through stage 4 chronic kidney disease, or unspecified chronic kidney disease: Secondary | ICD-10-CM | POA: Diagnosis not present

## 2023-07-18 DIAGNOSIS — I480 Paroxysmal atrial fibrillation: Secondary | ICD-10-CM | POA: Diagnosis not present

## 2023-07-18 DIAGNOSIS — K219 Gastro-esophageal reflux disease without esophagitis: Secondary | ICD-10-CM | POA: Diagnosis not present

## 2023-07-18 DIAGNOSIS — F419 Anxiety disorder, unspecified: Secondary | ICD-10-CM | POA: Diagnosis not present

## 2023-07-18 DIAGNOSIS — M810 Age-related osteoporosis without current pathological fracture: Secondary | ICD-10-CM | POA: Diagnosis not present

## 2023-07-18 DIAGNOSIS — J449 Chronic obstructive pulmonary disease, unspecified: Secondary | ICD-10-CM | POA: Diagnosis not present

## 2023-07-18 DIAGNOSIS — E1122 Type 2 diabetes mellitus with diabetic chronic kidney disease: Secondary | ICD-10-CM | POA: Diagnosis not present

## 2023-07-18 DIAGNOSIS — R296 Repeated falls: Secondary | ICD-10-CM | POA: Diagnosis not present

## 2023-07-18 DIAGNOSIS — M4800 Spinal stenosis, site unspecified: Secondary | ICD-10-CM | POA: Diagnosis not present

## 2023-07-18 DIAGNOSIS — N1832 Chronic kidney disease, stage 3b: Secondary | ICD-10-CM | POA: Diagnosis not present

## 2023-07-18 DIAGNOSIS — E538 Deficiency of other specified B group vitamins: Secondary | ICD-10-CM | POA: Diagnosis not present

## 2023-07-18 DIAGNOSIS — M5431 Sciatica, right side: Secondary | ICD-10-CM | POA: Diagnosis not present

## 2023-07-18 DIAGNOSIS — E1143 Type 2 diabetes mellitus with diabetic autonomic (poly)neuropathy: Secondary | ICD-10-CM | POA: Diagnosis not present

## 2023-07-18 DIAGNOSIS — E785 Hyperlipidemia, unspecified: Secondary | ICD-10-CM | POA: Diagnosis not present

## 2023-07-18 DIAGNOSIS — G894 Chronic pain syndrome: Secondary | ICD-10-CM | POA: Diagnosis not present

## 2023-07-18 DIAGNOSIS — F329 Major depressive disorder, single episode, unspecified: Secondary | ICD-10-CM | POA: Diagnosis not present

## 2023-07-18 NOTE — Telephone Encounter (Signed)
 Patient added to schedule.

## 2023-07-19 NOTE — Telephone Encounter (Unsigned)
 Copied from CRM [id number]. Topic: Clinical - Prescription Issue >> Jul 19, 2023  9:21 AM Gildardo Pounds wrote: Reason for CRM: Steward Drone at [facility], ALPRAZolam Prudy Feeler) 0.25 MG tablet, 1 at nightly as needed and take 1 daily if needed. Are you changing it to PRN for patient? Callback number [phone]

## 2023-07-19 NOTE — Telephone Encounter (Signed)
 [facility] calling again to get clarification if xanax will be PRN at night.    Steward Drone requesting a call back, [phone] available 9am-6pm

## 2023-07-19 NOTE — Progress Notes (Addendum)
   Subjective:    Patient ID: [name], female    DOB: Feb 19, 1924, 89 y.o.   MRN: [id number]  HPI Patient was seen at assisted living [facility] Patient does not drive and it was difficult for her to come to the office   Patient was seen as a hospital follow-up She is a resident at a long-term assisted care living [facility] Currently she has limited ability to get to the office She has a PEG tube that is draining seroma that she is following up with general surgery We went over her medication list We have made some adjustments to her medicines We wrote those corrections on her med list that the facility I went over that with the caretaker as well  Patient relates that she seems to have a halfway decent appetite She is able to take her medicines as they give them to her She denies any severe pain currently Because of partial bowel obstruction and abdominal symptoms we are trying to stay away from narcotics It is necessary for her to take Xanax at nighttime because of severe anxiety and insomnia Family is aware of the risk and benefits regarding accidental falls but they would like for [place] to continue current treatment   Review of Systems     Objective:   Physical Exam Lungs are clear no crackles heart is regular but tachycardic with a rate of 1 10-1 20 blood pressure acceptable 120/80 Extremities she does have some edema in both legs but not severe      Assessment & Plan:  Edema-we will try to be judicious with the use of torsemide rather than every other day we will change it to 1 on Monday 1 on Fridays She will take potassium on those days She will do a metabolic 7 on Friday the facility stated they would bring her to [facility] We will do a follow-up visit within the next 1 to 2 weeks  Tachycardia diltiazem bumped up to 180 mg to try to get the heart rate under better control to try to keep the heart rate in the 90s if possible  Her other medications were  reviewed  Xanax at nighttime and only during the day if panic attack  Patient unfortunately has long-term health issues that could pose difficult issues for her and guarded long-term prognosis Very nice patient  Patient currently on trimethoprim to make it less likely to have recurrent UTIs

## 2023-07-19 NOTE — Telephone Encounter (Signed)
 Appreciate this thank you-documentation was completed

## 2023-07-21 DIAGNOSIS — E1143 Type 2 diabetes mellitus with diabetic autonomic (poly)neuropathy: Secondary | ICD-10-CM | POA: Diagnosis not present

## 2023-07-21 DIAGNOSIS — E538 Deficiency of other specified B group vitamins: Secondary | ICD-10-CM | POA: Diagnosis not present

## 2023-07-21 DIAGNOSIS — Z7722 Contact with and (suspected) exposure to environmental tobacco smoke (acute) (chronic): Secondary | ICD-10-CM | POA: Diagnosis not present

## 2023-07-21 DIAGNOSIS — M5431 Sciatica, right side: Secondary | ICD-10-CM | POA: Diagnosis not present

## 2023-07-21 DIAGNOSIS — Z8542 Personal history of malignant neoplasm of other parts of uterus: Secondary | ICD-10-CM | POA: Diagnosis not present

## 2023-07-21 DIAGNOSIS — E1122 Type 2 diabetes mellitus with diabetic chronic kidney disease: Secondary | ICD-10-CM | POA: Diagnosis not present

## 2023-07-21 DIAGNOSIS — J449 Chronic obstructive pulmonary disease, unspecified: Secondary | ICD-10-CM | POA: Diagnosis not present

## 2023-07-21 DIAGNOSIS — Z7952 Long term (current) use of systemic steroids: Secondary | ICD-10-CM | POA: Diagnosis not present

## 2023-07-21 DIAGNOSIS — E785 Hyperlipidemia, unspecified: Secondary | ICD-10-CM | POA: Diagnosis not present

## 2023-07-21 DIAGNOSIS — I4892 Unspecified atrial flutter: Secondary | ICD-10-CM | POA: Diagnosis not present

## 2023-07-21 DIAGNOSIS — F329 Major depressive disorder, single episode, unspecified: Secondary | ICD-10-CM | POA: Diagnosis not present

## 2023-07-21 DIAGNOSIS — E039 Hypothyroidism, unspecified: Secondary | ICD-10-CM | POA: Diagnosis not present

## 2023-07-21 DIAGNOSIS — G894 Chronic pain syndrome: Secondary | ICD-10-CM | POA: Diagnosis not present

## 2023-07-21 DIAGNOSIS — M810 Age-related osteoporosis without current pathological fracture: Secondary | ICD-10-CM | POA: Diagnosis not present

## 2023-07-21 DIAGNOSIS — F419 Anxiety disorder, unspecified: Secondary | ICD-10-CM | POA: Diagnosis not present

## 2023-07-21 DIAGNOSIS — M109 Gout, unspecified: Secondary | ICD-10-CM | POA: Diagnosis not present

## 2023-07-21 DIAGNOSIS — R296 Repeated falls: Secondary | ICD-10-CM | POA: Diagnosis not present

## 2023-07-21 DIAGNOSIS — I129 Hypertensive chronic kidney disease with stage 1 through stage 4 chronic kidney disease, or unspecified chronic kidney disease: Secondary | ICD-10-CM | POA: Diagnosis not present

## 2023-07-21 DIAGNOSIS — M4800 Spinal stenosis, site unspecified: Secondary | ICD-10-CM | POA: Diagnosis not present

## 2023-07-21 DIAGNOSIS — I480 Paroxysmal atrial fibrillation: Secondary | ICD-10-CM | POA: Diagnosis not present

## 2023-07-21 DIAGNOSIS — Z9181 History of falling: Secondary | ICD-10-CM | POA: Diagnosis not present

## 2023-07-21 DIAGNOSIS — Z556 Problems related to health literacy: Secondary | ICD-10-CM | POA: Diagnosis not present

## 2023-07-21 DIAGNOSIS — N1832 Chronic kidney disease, stage 3b: Secondary | ICD-10-CM | POA: Diagnosis not present

## 2023-07-21 DIAGNOSIS — Z853 Personal history of malignant neoplasm of breast: Secondary | ICD-10-CM | POA: Diagnosis not present

## 2023-07-21 DIAGNOSIS — K219 Gastro-esophageal reflux disease without esophagitis: Secondary | ICD-10-CM | POA: Diagnosis not present

## 2023-07-22 LAB — BASIC METABOLIC PANEL
BUN/Creatinine Ratio: 13 (ref 12–28)
BUN: 15 mg/dL (ref 10–36)
CO2: 17 mmol/L — ABNORMAL LOW (ref 20–29)
Calcium: 8.7 mg/dL (ref 8.7–10.3)
Chloride: 108 mmol/L — ABNORMAL HIGH (ref 96–106)
Creatinine, Ser: 1.19 mg/dL — ABNORMAL HIGH (ref 0.57–1.00)
Glucose: 110 mg/dL — ABNORMAL HIGH (ref 70–99)
Potassium: 4.3 mmol/L (ref 3.5–5.2)
Sodium: 145 mmol/L — ABNORMAL HIGH (ref 134–144)
eGFR: 42 mL/min/{1.73_m2} — ABNORMAL LOW (ref >59)

## 2023-07-23 ENCOUNTER — Encounter: Payer: Self-pay | Admitting: Family Medicine

## 2023-07-23 ENCOUNTER — Telehealth: Payer: Self-pay | Admitting: Family Medicine

## 2023-07-23 NOTE — Telephone Encounter (Signed)
 Front desk Please add this patient to my schedule as a 1120 visit-I will be doing this at [facility] Please do this for Thursday or Friday thank you

## 2023-07-24 NOTE — Telephone Encounter (Signed)
 thanks

## 2023-07-24 NOTE — Telephone Encounter (Signed)
 Patient added to schedule Friday 07/28/23 at 11:20am

## 2023-07-26 DIAGNOSIS — E1122 Type 2 diabetes mellitus with diabetic chronic kidney disease: Secondary | ICD-10-CM | POA: Diagnosis not present

## 2023-07-26 DIAGNOSIS — E1143 Type 2 diabetes mellitus with diabetic autonomic (poly)neuropathy: Secondary | ICD-10-CM | POA: Diagnosis not present

## 2023-07-26 DIAGNOSIS — I129 Hypertensive chronic kidney disease with stage 1 through stage 4 chronic kidney disease, or unspecified chronic kidney disease: Secondary | ICD-10-CM | POA: Diagnosis not present

## 2023-07-26 DIAGNOSIS — E785 Hyperlipidemia, unspecified: Secondary | ICD-10-CM | POA: Diagnosis not present

## 2023-07-26 DIAGNOSIS — M4800 Spinal stenosis, site unspecified: Secondary | ICD-10-CM | POA: Diagnosis not present

## 2023-07-26 DIAGNOSIS — G894 Chronic pain syndrome: Secondary | ICD-10-CM | POA: Diagnosis not present

## 2023-07-26 DIAGNOSIS — E538 Deficiency of other specified B group vitamins: Secondary | ICD-10-CM | POA: Diagnosis not present

## 2023-07-26 DIAGNOSIS — I480 Paroxysmal atrial fibrillation: Secondary | ICD-10-CM | POA: Diagnosis not present

## 2023-07-26 DIAGNOSIS — M5431 Sciatica, right side: Secondary | ICD-10-CM | POA: Diagnosis not present

## 2023-07-26 DIAGNOSIS — K219 Gastro-esophageal reflux disease without esophagitis: Secondary | ICD-10-CM | POA: Diagnosis not present

## 2023-07-26 DIAGNOSIS — F419 Anxiety disorder, unspecified: Secondary | ICD-10-CM | POA: Diagnosis not present

## 2023-07-26 DIAGNOSIS — N1832 Chronic kidney disease, stage 3b: Secondary | ICD-10-CM | POA: Diagnosis not present

## 2023-07-26 DIAGNOSIS — F329 Major depressive disorder, single episode, unspecified: Secondary | ICD-10-CM | POA: Diagnosis not present

## 2023-07-26 DIAGNOSIS — R296 Repeated falls: Secondary | ICD-10-CM | POA: Diagnosis not present

## 2023-07-26 DIAGNOSIS — J449 Chronic obstructive pulmonary disease, unspecified: Secondary | ICD-10-CM | POA: Diagnosis not present

## 2023-07-26 DIAGNOSIS — M810 Age-related osteoporosis without current pathological fracture: Secondary | ICD-10-CM | POA: Diagnosis not present

## 2023-07-28 ENCOUNTER — Ambulatory Visit: Admitting: Family Medicine

## 2023-07-28 DIAGNOSIS — N1832 Chronic kidney disease, stage 3b: Secondary | ICD-10-CM

## 2023-07-28 DIAGNOSIS — R443 Hallucinations, unspecified: Secondary | ICD-10-CM

## 2023-08-01 ENCOUNTER — Telehealth: Payer: Self-pay

## 2023-08-01 NOTE — Telephone Encounter (Signed)
 Reason for CRM: Steward Drone from [facility] is calling to get the quantify for Tylenol,1 pill for every six hours. Please contact pharmacy at [phone] and asked for [facility].

## 2023-08-03 ENCOUNTER — Other Ambulatory Visit: Payer: Self-pay

## 2023-08-03 DIAGNOSIS — R296 Repeated falls: Secondary | ICD-10-CM | POA: Diagnosis not present

## 2023-08-03 DIAGNOSIS — E785 Hyperlipidemia, unspecified: Secondary | ICD-10-CM | POA: Diagnosis not present

## 2023-08-03 DIAGNOSIS — I129 Hypertensive chronic kidney disease with stage 1 through stage 4 chronic kidney disease, or unspecified chronic kidney disease: Secondary | ICD-10-CM | POA: Diagnosis not present

## 2023-08-03 DIAGNOSIS — G894 Chronic pain syndrome: Secondary | ICD-10-CM | POA: Diagnosis not present

## 2023-08-03 DIAGNOSIS — E1122 Type 2 diabetes mellitus with diabetic chronic kidney disease: Secondary | ICD-10-CM | POA: Diagnosis not present

## 2023-08-03 DIAGNOSIS — M810 Age-related osteoporosis without current pathological fracture: Secondary | ICD-10-CM | POA: Diagnosis not present

## 2023-08-03 DIAGNOSIS — M4800 Spinal stenosis, site unspecified: Secondary | ICD-10-CM | POA: Diagnosis not present

## 2023-08-03 DIAGNOSIS — M6281 Muscle weakness (generalized): Secondary | ICD-10-CM | POA: Diagnosis not present

## 2023-08-03 DIAGNOSIS — N1832 Chronic kidney disease, stage 3b: Secondary | ICD-10-CM | POA: Diagnosis not present

## 2023-08-03 DIAGNOSIS — F5101 Primary insomnia: Secondary | ICD-10-CM | POA: Diagnosis not present

## 2023-08-03 DIAGNOSIS — E538 Deficiency of other specified B group vitamins: Secondary | ICD-10-CM | POA: Diagnosis not present

## 2023-08-03 DIAGNOSIS — E1143 Type 2 diabetes mellitus with diabetic autonomic (poly)neuropathy: Secondary | ICD-10-CM | POA: Diagnosis not present

## 2023-08-03 DIAGNOSIS — I10 Essential (primary) hypertension: Secondary | ICD-10-CM | POA: Diagnosis not present

## 2023-08-03 DIAGNOSIS — M5431 Sciatica, right side: Secondary | ICD-10-CM | POA: Diagnosis not present

## 2023-08-03 DIAGNOSIS — F419 Anxiety disorder, unspecified: Secondary | ICD-10-CM | POA: Diagnosis not present

## 2023-08-03 DIAGNOSIS — I480 Paroxysmal atrial fibrillation: Secondary | ICD-10-CM | POA: Diagnosis not present

## 2023-08-03 DIAGNOSIS — K219 Gastro-esophageal reflux disease without esophagitis: Secondary | ICD-10-CM | POA: Diagnosis not present

## 2023-08-03 DIAGNOSIS — J449 Chronic obstructive pulmonary disease, unspecified: Secondary | ICD-10-CM | POA: Diagnosis not present

## 2023-08-03 DIAGNOSIS — F329 Major depressive disorder, single episode, unspecified: Secondary | ICD-10-CM | POA: Diagnosis not present

## 2023-08-03 MED ORDER — ACETAMINOPHEN 500 MG PO TABS: 500.0000 mg | ORAL_TABLET | Freq: Four times a day (QID) | ORAL | 4 refills | Status: DC | PRN

## 2023-08-03 NOTE — Telephone Encounter (Signed)
 The order should be Tylenol 500 mg 1 taken every 6 hours as needed for pain #50 with 4 refills thank you

## 2023-08-07 DIAGNOSIS — F419 Anxiety disorder, unspecified: Secondary | ICD-10-CM | POA: Diagnosis not present

## 2023-08-07 DIAGNOSIS — N1832 Chronic kidney disease, stage 3b: Secondary | ICD-10-CM | POA: Diagnosis not present

## 2023-08-07 DIAGNOSIS — I129 Hypertensive chronic kidney disease with stage 1 through stage 4 chronic kidney disease, or unspecified chronic kidney disease: Secondary | ICD-10-CM | POA: Diagnosis not present

## 2023-08-07 DIAGNOSIS — M4800 Spinal stenosis, site unspecified: Secondary | ICD-10-CM | POA: Diagnosis not present

## 2023-08-07 DIAGNOSIS — E538 Deficiency of other specified B group vitamins: Secondary | ICD-10-CM | POA: Diagnosis not present

## 2023-08-07 DIAGNOSIS — K219 Gastro-esophageal reflux disease without esophagitis: Secondary | ICD-10-CM | POA: Diagnosis not present

## 2023-08-07 DIAGNOSIS — E1122 Type 2 diabetes mellitus with diabetic chronic kidney disease: Secondary | ICD-10-CM | POA: Diagnosis not present

## 2023-08-07 DIAGNOSIS — I480 Paroxysmal atrial fibrillation: Secondary | ICD-10-CM | POA: Diagnosis not present

## 2023-08-07 DIAGNOSIS — F329 Major depressive disorder, single episode, unspecified: Secondary | ICD-10-CM | POA: Diagnosis not present

## 2023-08-07 DIAGNOSIS — G894 Chronic pain syndrome: Secondary | ICD-10-CM | POA: Diagnosis not present

## 2023-08-07 DIAGNOSIS — R296 Repeated falls: Secondary | ICD-10-CM | POA: Diagnosis not present

## 2023-08-07 DIAGNOSIS — M5431 Sciatica, right side: Secondary | ICD-10-CM | POA: Diagnosis not present

## 2023-08-07 DIAGNOSIS — M810 Age-related osteoporosis without current pathological fracture: Secondary | ICD-10-CM | POA: Diagnosis not present

## 2023-08-07 DIAGNOSIS — E785 Hyperlipidemia, unspecified: Secondary | ICD-10-CM | POA: Diagnosis not present

## 2023-08-07 DIAGNOSIS — J449 Chronic obstructive pulmonary disease, unspecified: Secondary | ICD-10-CM | POA: Diagnosis not present

## 2023-08-07 DIAGNOSIS — E1143 Type 2 diabetes mellitus with diabetic autonomic (poly)neuropathy: Secondary | ICD-10-CM | POA: Diagnosis not present

## 2023-08-08 DIAGNOSIS — E1122 Type 2 diabetes mellitus with diabetic chronic kidney disease: Secondary | ICD-10-CM | POA: Diagnosis not present

## 2023-08-08 DIAGNOSIS — E1143 Type 2 diabetes mellitus with diabetic autonomic (poly)neuropathy: Secondary | ICD-10-CM | POA: Diagnosis not present

## 2023-08-08 DIAGNOSIS — F419 Anxiety disorder, unspecified: Secondary | ICD-10-CM | POA: Diagnosis not present

## 2023-08-08 DIAGNOSIS — K219 Gastro-esophageal reflux disease without esophagitis: Secondary | ICD-10-CM | POA: Diagnosis not present

## 2023-08-08 DIAGNOSIS — M4800 Spinal stenosis, site unspecified: Secondary | ICD-10-CM | POA: Diagnosis not present

## 2023-08-08 DIAGNOSIS — F329 Major depressive disorder, single episode, unspecified: Secondary | ICD-10-CM | POA: Diagnosis not present

## 2023-08-08 DIAGNOSIS — J449 Chronic obstructive pulmonary disease, unspecified: Secondary | ICD-10-CM | POA: Diagnosis not present

## 2023-08-08 DIAGNOSIS — R296 Repeated falls: Secondary | ICD-10-CM | POA: Diagnosis not present

## 2023-08-08 DIAGNOSIS — E785 Hyperlipidemia, unspecified: Secondary | ICD-10-CM | POA: Diagnosis not present

## 2023-08-08 DIAGNOSIS — E538 Deficiency of other specified B group vitamins: Secondary | ICD-10-CM | POA: Diagnosis not present

## 2023-08-08 DIAGNOSIS — M810 Age-related osteoporosis without current pathological fracture: Secondary | ICD-10-CM | POA: Diagnosis not present

## 2023-08-08 DIAGNOSIS — G894 Chronic pain syndrome: Secondary | ICD-10-CM | POA: Diagnosis not present

## 2023-08-08 DIAGNOSIS — N1832 Chronic kidney disease, stage 3b: Secondary | ICD-10-CM | POA: Diagnosis not present

## 2023-08-08 DIAGNOSIS — I480 Paroxysmal atrial fibrillation: Secondary | ICD-10-CM | POA: Diagnosis not present

## 2023-08-08 DIAGNOSIS — M5431 Sciatica, right side: Secondary | ICD-10-CM | POA: Diagnosis not present

## 2023-08-08 DIAGNOSIS — I129 Hypertensive chronic kidney disease with stage 1 through stage 4 chronic kidney disease, or unspecified chronic kidney disease: Secondary | ICD-10-CM | POA: Diagnosis not present

## 2023-08-09 DIAGNOSIS — M4800 Spinal stenosis, site unspecified: Secondary | ICD-10-CM

## 2023-08-09 DIAGNOSIS — I129 Hypertensive chronic kidney disease with stage 1 through stage 4 chronic kidney disease, or unspecified chronic kidney disease: Secondary | ICD-10-CM | POA: Diagnosis not present

## 2023-08-09 DIAGNOSIS — E1143 Type 2 diabetes mellitus with diabetic autonomic (poly)neuropathy: Secondary | ICD-10-CM

## 2023-08-09 DIAGNOSIS — K219 Gastro-esophageal reflux disease without esophagitis: Secondary | ICD-10-CM

## 2023-08-09 DIAGNOSIS — I480 Paroxysmal atrial fibrillation: Secondary | ICD-10-CM | POA: Diagnosis not present

## 2023-08-09 DIAGNOSIS — M5431 Sciatica, right side: Secondary | ICD-10-CM

## 2023-08-09 DIAGNOSIS — E1122 Type 2 diabetes mellitus with diabetic chronic kidney disease: Secondary | ICD-10-CM | POA: Diagnosis not present

## 2023-08-09 DIAGNOSIS — E785 Hyperlipidemia, unspecified: Secondary | ICD-10-CM

## 2023-08-09 DIAGNOSIS — J449 Chronic obstructive pulmonary disease, unspecified: Secondary | ICD-10-CM

## 2023-08-09 DIAGNOSIS — N1832 Chronic kidney disease, stage 3b: Secondary | ICD-10-CM | POA: Diagnosis not present

## 2023-08-09 DIAGNOSIS — F329 Major depressive disorder, single episode, unspecified: Secondary | ICD-10-CM

## 2023-08-09 DIAGNOSIS — F419 Anxiety disorder, unspecified: Secondary | ICD-10-CM

## 2023-08-10 ENCOUNTER — Telehealth: Payer: Self-pay | Admitting: Family Medicine

## 2023-08-10 NOTE — Telephone Encounter (Signed)
 Left a message for [facility] staff [phone] to return the call for details and pt status. Spoke with daughter she was with pt last night and she was not symptomatic at that time.

## 2023-08-10 NOTE — Telephone Encounter (Signed)
 Please talk with nursing assistant who works with the patient  The patient is having abdominal pain and vomiting not feeling good today I would recommend emergency department If it is more of intermittent vomiting but otherwise able to keep things down not complaining of pain Then I would recommend office visit tomorrow morning I believe I have a 10 AM appointment We can see her we may have to send her for blood work and x-rays depending on what we find  As for the sertraline this was sent in a month ago-how long have they been out of the medicine?  Which pharmacy or the utilizing?  This was sent to [facility] back in February so I do not understand why they do not have the medicine  Are there any other medicines that they need thank you

## 2023-08-11 ENCOUNTER — Other Ambulatory Visit: Payer: Self-pay | Admitting: Family Medicine

## 2023-08-11 NOTE — Telephone Encounter (Signed)
 Spoke with Chasity at [facility] she states pt is not currently symptomatic, but is expecting a fax back for raised toilet seat and bed rails.

## 2023-08-14 ENCOUNTER — Telehealth: Payer: Self-pay | Admitting: Family Medicine

## 2023-08-14 NOTE — Progress Notes (Signed)
 Patient was seen as part of a home visit Patient stays at home  Patient stays at local rest home Her medicines are administered there Is difficult for the patient to get to the office I went to see the patient because he was having some problems with hallucinations at nighttime.  No agitation.  Patient eating okay.  Occasional nausea.  No abdominal pain.  Has some minimal swelling in her legs  Physical exam lungs clear heart regular extremities trace edema patient oriented to self and place patient states that time she sees someone at her closet sometimes laying in her bed  I had discussion with patient.  I feel that she is having some mild hallucinations.  Probably age-related.  She is not paranoid.  Not having agitation or violence.  I would not recommend any antipsychotics currently.  We will monitor this.  The rest of her medicines were reviewed.  We will follow her up in several weeks She is on sertraline for his depression

## 2023-08-14 NOTE — Telephone Encounter (Signed)
 Please set her up as a home visit t [facility] in April.  Please choose any day that I am working at 11:20 AM thank you

## 2023-08-15 ENCOUNTER — Other Ambulatory Visit: Payer: Self-pay | Admitting: Family Medicine

## 2023-08-15 DIAGNOSIS — G894 Chronic pain syndrome: Secondary | ICD-10-CM | POA: Diagnosis not present

## 2023-08-15 DIAGNOSIS — F419 Anxiety disorder, unspecified: Secondary | ICD-10-CM | POA: Diagnosis not present

## 2023-08-15 DIAGNOSIS — J449 Chronic obstructive pulmonary disease, unspecified: Secondary | ICD-10-CM | POA: Diagnosis not present

## 2023-08-15 DIAGNOSIS — E785 Hyperlipidemia, unspecified: Secondary | ICD-10-CM | POA: Diagnosis not present

## 2023-08-15 DIAGNOSIS — M810 Age-related osteoporosis without current pathological fracture: Secondary | ICD-10-CM | POA: Diagnosis not present

## 2023-08-15 DIAGNOSIS — K219 Gastro-esophageal reflux disease without esophagitis: Secondary | ICD-10-CM | POA: Diagnosis not present

## 2023-08-15 DIAGNOSIS — E1143 Type 2 diabetes mellitus with diabetic autonomic (poly)neuropathy: Secondary | ICD-10-CM | POA: Diagnosis not present

## 2023-08-15 DIAGNOSIS — I480 Paroxysmal atrial fibrillation: Secondary | ICD-10-CM | POA: Diagnosis not present

## 2023-08-15 DIAGNOSIS — I129 Hypertensive chronic kidney disease with stage 1 through stage 4 chronic kidney disease, or unspecified chronic kidney disease: Secondary | ICD-10-CM | POA: Diagnosis not present

## 2023-08-15 DIAGNOSIS — M4800 Spinal stenosis, site unspecified: Secondary | ICD-10-CM | POA: Diagnosis not present

## 2023-08-15 DIAGNOSIS — M5431 Sciatica, right side: Secondary | ICD-10-CM | POA: Diagnosis not present

## 2023-08-15 DIAGNOSIS — E538 Deficiency of other specified B group vitamins: Secondary | ICD-10-CM | POA: Diagnosis not present

## 2023-08-15 DIAGNOSIS — F329 Major depressive disorder, single episode, unspecified: Secondary | ICD-10-CM | POA: Diagnosis not present

## 2023-08-15 DIAGNOSIS — E1122 Type 2 diabetes mellitus with diabetic chronic kidney disease: Secondary | ICD-10-CM | POA: Diagnosis not present

## 2023-08-15 DIAGNOSIS — N1832 Chronic kidney disease, stage 3b: Secondary | ICD-10-CM | POA: Diagnosis not present

## 2023-08-15 DIAGNOSIS — R296 Repeated falls: Secondary | ICD-10-CM | POA: Diagnosis not present

## 2023-08-15 MED ORDER — ALPRAZOLAM 0.25 MG PO TABS: ORAL_TABLET | ORAL | 5 refills | Status: DC

## 2023-08-15 MED ORDER — SERTRALINE HCL 50 MG PO TABS: ORAL_TABLET | ORAL | 5 refills | Status: DC

## 2023-08-20 DIAGNOSIS — R296 Repeated falls: Secondary | ICD-10-CM | POA: Diagnosis not present

## 2023-08-20 DIAGNOSIS — M4800 Spinal stenosis, site unspecified: Secondary | ICD-10-CM | POA: Diagnosis not present

## 2023-08-20 DIAGNOSIS — I4892 Unspecified atrial flutter: Secondary | ICD-10-CM | POA: Diagnosis not present

## 2023-08-20 DIAGNOSIS — E538 Deficiency of other specified B group vitamins: Secondary | ICD-10-CM | POA: Diagnosis not present

## 2023-08-20 DIAGNOSIS — Z7952 Long term (current) use of systemic steroids: Secondary | ICD-10-CM | POA: Diagnosis not present

## 2023-08-20 DIAGNOSIS — N1832 Chronic kidney disease, stage 3b: Secondary | ICD-10-CM | POA: Diagnosis not present

## 2023-08-20 DIAGNOSIS — Z556 Problems related to health literacy: Secondary | ICD-10-CM | POA: Diagnosis not present

## 2023-08-20 DIAGNOSIS — Z7722 Contact with and (suspected) exposure to environmental tobacco smoke (acute) (chronic): Secondary | ICD-10-CM | POA: Diagnosis not present

## 2023-08-20 DIAGNOSIS — M5431 Sciatica, right side: Secondary | ICD-10-CM | POA: Diagnosis not present

## 2023-08-20 DIAGNOSIS — G894 Chronic pain syndrome: Secondary | ICD-10-CM | POA: Diagnosis not present

## 2023-08-20 DIAGNOSIS — J449 Chronic obstructive pulmonary disease, unspecified: Secondary | ICD-10-CM | POA: Diagnosis not present

## 2023-08-20 DIAGNOSIS — F419 Anxiety disorder, unspecified: Secondary | ICD-10-CM | POA: Diagnosis not present

## 2023-08-20 DIAGNOSIS — I129 Hypertensive chronic kidney disease with stage 1 through stage 4 chronic kidney disease, or unspecified chronic kidney disease: Secondary | ICD-10-CM | POA: Diagnosis not present

## 2023-08-20 DIAGNOSIS — M109 Gout, unspecified: Secondary | ICD-10-CM | POA: Diagnosis not present

## 2023-08-20 DIAGNOSIS — E039 Hypothyroidism, unspecified: Secondary | ICD-10-CM | POA: Diagnosis not present

## 2023-08-20 DIAGNOSIS — Z9181 History of falling: Secondary | ICD-10-CM | POA: Diagnosis not present

## 2023-08-20 DIAGNOSIS — Z8542 Personal history of malignant neoplasm of other parts of uterus: Secondary | ICD-10-CM | POA: Diagnosis not present

## 2023-08-20 DIAGNOSIS — M810 Age-related osteoporosis without current pathological fracture: Secondary | ICD-10-CM | POA: Diagnosis not present

## 2023-08-20 DIAGNOSIS — E1122 Type 2 diabetes mellitus with diabetic chronic kidney disease: Secondary | ICD-10-CM | POA: Diagnosis not present

## 2023-08-20 DIAGNOSIS — F329 Major depressive disorder, single episode, unspecified: Secondary | ICD-10-CM | POA: Diagnosis not present

## 2023-08-20 DIAGNOSIS — K219 Gastro-esophageal reflux disease without esophagitis: Secondary | ICD-10-CM | POA: Diagnosis not present

## 2023-08-20 DIAGNOSIS — E1143 Type 2 diabetes mellitus with diabetic autonomic (poly)neuropathy: Secondary | ICD-10-CM | POA: Diagnosis not present

## 2023-08-20 DIAGNOSIS — E785 Hyperlipidemia, unspecified: Secondary | ICD-10-CM | POA: Diagnosis not present

## 2023-08-20 DIAGNOSIS — Z853 Personal history of malignant neoplasm of breast: Secondary | ICD-10-CM | POA: Diagnosis not present

## 2023-08-24 ENCOUNTER — Encounter (HOSPITAL_COMMUNITY): Payer: Self-pay | Admitting: Emergency Medicine

## 2023-08-24 ENCOUNTER — Emergency Department (HOSPITAL_COMMUNITY)

## 2023-08-24 ENCOUNTER — Other Ambulatory Visit: Payer: Self-pay

## 2023-08-24 ENCOUNTER — Emergency Department (HOSPITAL_COMMUNITY)
Admission: EM | Admit: 2023-08-24 | Discharge: 2023-08-24 | Disposition: A | Discharge status: Skilled Nursing Facility | Attending: Emergency Medicine | Admitting: Emergency Medicine

## 2023-08-24 DIAGNOSIS — W19XXXA Unspecified fall, initial encounter: Secondary | ICD-10-CM

## 2023-08-24 DIAGNOSIS — E538 Deficiency of other specified B group vitamins: Secondary | ICD-10-CM | POA: Diagnosis not present

## 2023-08-24 DIAGNOSIS — S0510XA Contusion of eyeball and orbital tissues, unspecified eye, initial encounter: Secondary | ICD-10-CM | POA: Diagnosis not present

## 2023-08-24 DIAGNOSIS — S0990XA Unspecified injury of head, initial encounter: Secondary | ICD-10-CM | POA: Diagnosis not present

## 2023-08-24 DIAGNOSIS — M85862 Other specified disorders of bone density and structure, left lower leg: Secondary | ICD-10-CM | POA: Diagnosis not present

## 2023-08-24 DIAGNOSIS — R0781 Pleurodynia: Secondary | ICD-10-CM | POA: Diagnosis not present

## 2023-08-24 DIAGNOSIS — F419 Anxiety disorder, unspecified: Secondary | ICD-10-CM | POA: Diagnosis not present

## 2023-08-24 DIAGNOSIS — M25561 Pain in right knee: Secondary | ICD-10-CM | POA: Insufficient documentation

## 2023-08-24 DIAGNOSIS — S0083XA Contusion of other part of head, initial encounter: Secondary | ICD-10-CM | POA: Diagnosis not present

## 2023-08-24 DIAGNOSIS — R079 Chest pain, unspecified: Secondary | ICD-10-CM | POA: Diagnosis not present

## 2023-08-24 DIAGNOSIS — M5431 Sciatica, right side: Secondary | ICD-10-CM | POA: Diagnosis not present

## 2023-08-24 DIAGNOSIS — M4800 Spinal stenosis, site unspecified: Secondary | ICD-10-CM | POA: Diagnosis not present

## 2023-08-24 DIAGNOSIS — M1711 Unilateral primary osteoarthritis, right knee: Secondary | ICD-10-CM | POA: Diagnosis not present

## 2023-08-24 DIAGNOSIS — M25562 Pain in left knee: Secondary | ICD-10-CM | POA: Diagnosis not present

## 2023-08-24 DIAGNOSIS — I4891 Unspecified atrial fibrillation: Secondary | ICD-10-CM | POA: Diagnosis not present

## 2023-08-24 DIAGNOSIS — E785 Hyperlipidemia, unspecified: Secondary | ICD-10-CM | POA: Diagnosis not present

## 2023-08-24 DIAGNOSIS — G894 Chronic pain syndrome: Secondary | ICD-10-CM | POA: Diagnosis not present

## 2023-08-24 DIAGNOSIS — I129 Hypertensive chronic kidney disease with stage 1 through stage 4 chronic kidney disease, or unspecified chronic kidney disease: Secondary | ICD-10-CM | POA: Diagnosis not present

## 2023-08-24 DIAGNOSIS — W06XXXA Fall from bed, initial encounter: Secondary | ICD-10-CM | POA: Insufficient documentation

## 2023-08-24 DIAGNOSIS — F329 Major depressive disorder, single episode, unspecified: Secondary | ICD-10-CM | POA: Diagnosis not present

## 2023-08-24 DIAGNOSIS — R0789 Other chest pain: Secondary | ICD-10-CM

## 2023-08-24 DIAGNOSIS — K219 Gastro-esophageal reflux disease without esophagitis: Secondary | ICD-10-CM | POA: Diagnosis not present

## 2023-08-24 DIAGNOSIS — Z79899 Other long term (current) drug therapy: Secondary | ICD-10-CM | POA: Diagnosis not present

## 2023-08-24 DIAGNOSIS — M810 Age-related osteoporosis without current pathological fracture: Secondary | ICD-10-CM | POA: Diagnosis not present

## 2023-08-24 DIAGNOSIS — Z853 Personal history of malignant neoplasm of breast: Secondary | ICD-10-CM | POA: Insufficient documentation

## 2023-08-24 DIAGNOSIS — I480 Paroxysmal atrial fibrillation: Secondary | ICD-10-CM | POA: Diagnosis not present

## 2023-08-24 DIAGNOSIS — J449 Chronic obstructive pulmonary disease, unspecified: Secondary | ICD-10-CM | POA: Diagnosis not present

## 2023-08-24 DIAGNOSIS — R519 Headache, unspecified: Secondary | ICD-10-CM | POA: Diagnosis not present

## 2023-08-24 DIAGNOSIS — E1143 Type 2 diabetes mellitus with diabetic autonomic (poly)neuropathy: Secondary | ICD-10-CM | POA: Diagnosis not present

## 2023-08-24 DIAGNOSIS — N1832 Chronic kidney disease, stage 3b: Secondary | ICD-10-CM | POA: Diagnosis not present

## 2023-08-24 DIAGNOSIS — E1122 Type 2 diabetes mellitus with diabetic chronic kidney disease: Secondary | ICD-10-CM | POA: Diagnosis not present

## 2023-08-24 DIAGNOSIS — S199XXA Unspecified injury of neck, initial encounter: Secondary | ICD-10-CM | POA: Diagnosis not present

## 2023-08-24 DIAGNOSIS — R296 Repeated falls: Secondary | ICD-10-CM | POA: Diagnosis not present

## 2023-08-24 DIAGNOSIS — I700 Atherosclerosis of aorta: Secondary | ICD-10-CM | POA: Diagnosis not present

## 2023-08-24 DIAGNOSIS — M1712 Unilateral primary osteoarthritis, left knee: Secondary | ICD-10-CM | POA: Diagnosis not present

## 2023-08-24 LAB — BASIC METABOLIC PANEL WITH GFR
Anion gap: 8 (ref 5–15)
BUN: 20 mg/dL (ref 8–23)
CO2: 26 mmol/L (ref 22–32)
Calcium: 9.3 mg/dL (ref 8.9–10.3)
Chloride: 105 mmol/L (ref 98–111)
Creatinine, Ser: 1.19 mg/dL — ABNORMAL HIGH (ref 0.44–1.00)
GFR, Estimated: 42 mL/min — ABNORMAL LOW (ref >60)
Glucose, Bld: 104 mg/dL — ABNORMAL HIGH (ref 70–99)
Potassium: 4.6 mmol/L (ref 3.5–5.1)
Sodium: 139 mmol/L (ref 135–145)

## 2023-08-24 LAB — CBC WITH DIFFERENTIAL/PLATELET
Abs Immature Granulocytes: 0.05 10*3/uL (ref 0.00–0.07)
Basophils Absolute: 0 10*3/uL (ref 0.0–0.1)
Basophils Relative: 0 %
Eosinophils Absolute: 0.2 10*3/uL (ref 0.0–0.5)
Eosinophils Relative: 3 %
HCT: 38.3 % (ref 36.0–46.0)
Hemoglobin: 12 g/dL (ref 12.0–15.0)
Immature Granulocytes: 1 %
Lymphocytes Relative: 20 %
Lymphs Abs: 1.6 10*3/uL (ref 0.7–4.0)
MCH: 28.4 pg (ref 26.0–34.0)
MCHC: 31.3 g/dL (ref 30.0–36.0)
MCV: 90.8 fL (ref 80.0–100.0)
Monocytes Absolute: 0.6 10*3/uL (ref 0.1–1.0)
Monocytes Relative: 7 %
Neutro Abs: 5.6 10*3/uL (ref 1.7–7.7)
Neutrophils Relative %: 69 %
Platelets: 244 10*3/uL (ref 150–400)
RBC: 4.22 MIL/uL (ref 3.87–5.11)
RDW: 13.3 % (ref 11.5–15.5)
WBC: 8.1 10*3/uL (ref 4.0–10.5)
nRBC: 0 % (ref 0.0–0.2)

## 2023-08-24 LAB — TROPONIN I (HIGH SENSITIVITY)
Troponin I (High Sensitivity): 5 ng/L (ref <18)
Troponin I (High Sensitivity): 5 ng/L (ref <18)

## 2023-08-24 MED ORDER — ACETAMINOPHEN 500 MG PO TABS
1000.0000 mg | ORAL_TABLET | Freq: Once | ORAL | Status: AC
  Administered 2023-08-24: 1000 mg via ORAL
  Filled 2023-08-24: qty 2

## 2023-08-24 NOTE — ED Provider Notes (Signed)
 [facility] Provider Note   CSN: [id number] Arrival date & time: 08/24/23  7846     History  Chief Complaint  Patient presents with   Fall   Rib Injury   Head Injury    [name] is a 89 y.o. female.  She is brought in by ambulance from her facility after an unwitnessed fall.  She has a history of A-fib not on anticoagulation, breast cancer.  She said she was getting out of bed and she fell, she does not know why she is not sure if she lost consciousness.  Complaining of pain in her head and a little bit of pain in her left and right knee.  She has also got some pain in her left upper chest.  She denies any neck pain numbness weakness shortness of breath.  She has some chronic abdominal pain from her prior surgery.  The history is provided by the patient and the EMS personnel.  Fall The problem has not changed since onset.Associated symptoms include chest pain and headaches. Pertinent negatives include no abdominal pain and no shortness of breath. Nothing aggravates the symptoms. Nothing relieves the symptoms. She has tried nothing for the symptoms. The treatment provided no relief.  Head Injury Associated symptoms: headache   Associated symptoms: no neck pain        Home Medications Prior to Admission medications   Medication Sig Start Date End Date Taking? Authorizing Provider  acetaminophen (TYLENOL) 500 MG tablet Take 1 tablet (500 mg total) by mouth every 6 (six) hours as needed. 08/03/23   Babs Sciara, MD  allopurinol (ZYLOPRIM) 100 MG tablet TAKE 1/2 TABLET(50MG ) BY MOUTH ONCE DAILY. 08/11/23   Babs Sciara, MD  ALPRAZolam Prudy Feeler) 0.25 MG tablet 1 nightly as needed insomnia may also have 1 during the day if needed for panic attack only use sparingly for this purpose 08/15/23   Babs Sciara, MD  diltiazem (CARDIZEM CD) 180 MG 24 hr capsule Take 1 capsule (180 mg total) by mouth daily. 07/17/23   Babs Sciara, MD   estradiol (ESTRACE) 0.1 MG/GM vaginal cream Discard plastic applicator. LTC staff are to insert a blueberry size amount (approximately 1 gram) of cream on gloved fingertip 1 cm inside patient vagina Monday and Friday night at bedtime. For long term use. 07/10/23   Luking, Jonna Coup, MD  GOODSENSE PAIN RELIEF EXTRA ST 500 MG tablet TAKE (1) TABLET BY MOUTH TWICE DAILY FOR BACK PAIN. 08/11/23   Babs Sciara, MD  levothyroxine (SYNTHROID) 75 MCG tablet TAKE (1) TABLET BY MOUTH EVERY MORNING. **TAKE ON AN EMPTY STOMACH AT LEAST 30 MINUTES FROM FOOD AND OTHER MEDS** 06/27/23   Babs Sciara, MD  Melatonin 3 MG SUBL TAKE (1) TABLET BY MOUTH AT BEDTIME. 08/11/23   Babs Sciara, MD  Multiple Vitamins-Minerals (MULTIVITAMINS THER. W/MINERALS) TABS tablet Take 1 tablet by mouth daily.    [provider]  omeprazole (PRILOSEC) 40 MG capsule Take 1 capsule (40 mg total) by mouth daily. 07/10/23   Babs Sciara, MD  polyethylene glycol powder (GLYCOLAX/MIRALAX) 17 GM/SCOOP powder Take 17 g by mouth daily.    [provider]  potassium chloride (KLOR-CON M) 10 MEQ tablet 1 on Monday morning and 1 on Friday morning 07/17/23   Luking, Jonna Coup, MD  REFRESH 1.4-0.6 % SOLN INSTILL ONE DROP IN [facility] EYE TWICE DAILY. 07/19/23   Babs Sciara, MD  senna-docusate (SENOKOT-S)  8.6-50 MG tablet Take 1 tablet by mouth daily.    [provider]  sertraline (ZOLOFT) 50 MG tablet 1 and 1/2 daily 08/15/23   Babs Sciara, MD  torsemide [facility]) 20 MG tablet Take 1 on Monday morning and 1 on Friday morning 07/17/23   Babs Sciara, MD  trimethoprim (TRIMPEX) 100 MG tablet TAKE (1) TABLET BY MOUTH ONCE DAILY. 07/19/23   Babs Sciara, MD  UNABLE TO FIND 500 mg. Med Name: D-mannose    [provider]      Allergies    Amiodarone, Cephalosporins, Meloxicam, Robaxin [methocarbamol], Amoxicillin, Lovenox [enoxaparin sodium], Morphine, Morphine and codeine, Penicillins, Sulfa antibiotics,  Lipitor [atorvastatin], Lorazepam, Macrobid [nitrofurantoin], and Metoprolol    Review of Systems   Review of Systems  Constitutional:  Negative for fever.  Eyes:  Negative for visual disturbance.  Respiratory:  Negative for shortness of breath.   Cardiovascular:  Positive for chest pain.  Gastrointestinal:  Negative for abdominal pain.  Musculoskeletal:  Negative for neck pain.  Neurological:  Positive for headaches.    Physical Exam Updated Vital Signs BP (!) 169/78   Pulse 75   Temp 98.5 F (36.9 C) (Oral)   Resp 19   Ht 5\' 2"  (1.575 m)   Wt 59 kg   SpO2 96%   BMI 23.78 kg/m  Physical Exam Vitals and nursing note reviewed.  Constitutional:      General: She is not in acute distress.    Appearance: Normal appearance. She is well-developed.  HENT:     Head: Normocephalic.     Comments: He has a large hematoma swelling and bruising to her left forehead Eyes:     Conjunctiva/sclera: Conjunctivae normal.  Cardiovascular:     Rate and Rhythm: Normal rate and regular rhythm.     Heart sounds: No murmur heard. Pulmonary:     Effort: Pulmonary effort is normal. No respiratory distress.     Breath sounds: Normal breath sounds.  Chest:    Abdominal:     Palpations: Abdomen is soft.     Tenderness: There is no abdominal tenderness. There is no guarding or rebound.  Musculoskeletal:        General: Tenderness present. No deformity.     Cervical back: Neck supple.     Comments: Full range of motion of her upper extremities without any pain or limitations.  Full range of motion of her lower extremities although there is some vague tenderness around her right and left knee.  She said the right knee is chronic.  Distal pulses motor and sensation intact  Skin:    General: Skin is warm and dry.     Capillary Refill: Capillary refill takes less than 2 seconds.  Neurological:     General: No focal deficit present.     Mental Status: She is alert.     Sensory: No sensory  deficit.     Motor: No weakness.     ED Results / Procedures / Treatments   Labs (all labs ordered are listed, but only abnormal results are displayed) Labs Reviewed  BASIC METABOLIC PANEL WITH GFR - Abnormal; Notable for the following components:      Result Value   Glucose, Bld 104 (*)    Creatinine, Ser 1.19 (*)    GFR, Estimated 42 (*)    All other components within normal limits  CBC WITH DIFFERENTIAL/PLATELET  TROPONIN I (HIGH SENSITIVITY)  TROPONIN I (HIGH SENSITIVITY)    EKG EKG  Interpretation Date/Time:  Thursday August 24 2023 08:12:37 EDT Ventricular Rate:  75 PR Interval:  196 QRS Duration:  85 QT Interval:  436 QTC Calculation: 487 R Axis:   21  Text Interpretation: Sinus rhythm Borderline prolonged QT interval No significant change since prior 10/24 Confirmed by Meridee Score [phone]) on 08/24/2023 8:19:44 AM  Radiology DG Knee Complete 4 Views Right Result Date: 08/24/2023 CLINICAL DATA:  Pain after fall EXAM: RIGHT KNEE - COMPLETE 4 VIEW COMPARISON:  Two view knee x-ray 07/10/2023. FINDINGS: No fracture or dislocation. No joint effusion lateral view. Osteophyte formation seen of all 3 compartments. There is some mild joint space loss of the patellofemoral joint and lateral compartment. Small joint body seen posteriorly about the knee. Prominent vascular calcifications. IMPRESSION: Moderate degenerative changes.  Probable joint body. Electronically Signed   By: Karen Kays M.D.   On: 08/24/2023 11:31   DG Knee Complete 4 Views Left Result Date: 08/24/2023 CLINICAL DATA:  Pain after fall EXAM: LEFT KNEE - COMPLETE 4 VIEW COMPARISON:  None Available. FINDINGS: Osteopenia. No fracture or dislocation. Preserved joint spaces. Minimal osteophytes. No joint effusion. Prominent vascular calcifications seen posterior to the knee. IMPRESSION: Osteopenia.  Mild degenerative change. Electronically Signed   By: Karen Kays M.D.   On: 08/24/2023 11:29   DG Chest 1 View Result  Date: 08/24/2023 CLINICAL DATA:  Pain after fall EXAM: CHEST  1 VIEW COMPARISON:  X-ray 07/09/2023 and older FINDINGS: Underinflation. No pneumothorax or edema. Stable mild left lung base opacity. Question tiny left effusion or thickening. Normal cardiopericardial silhouette. Calcified aorta. Old right-sided rib fractures. Overlapping cardiac leads. Degenerative changes of the spine and shoulders. IMPRESSION: No significant interval change.  No pneumothorax. Electronically Signed   By: Karen Kays M.D.   On: 08/24/2023 11:26   CT Head Wo Contrast Result Date: 08/24/2023 CLINICAL DATA:  Provided history: Head trauma, minor. Neck trauma. Additional history provided: Unwitnessed fall (with head strike). Headache. Forehead contusion. EXAM: CT HEAD WITHOUT CONTRAST CT CERVICAL SPINE WITHOUT CONTRAST TECHNIQUE: Multidetector CT imaging of the head and cervical spine was performed following the standard protocol without intravenous contrast. Multiplanar CT image reconstructions of the cervical spine were also generated. RADIATION DOSE REDUCTION: This exam was performed according to the departmental dose-optimization program which includes automated exposure control, adjustment of the mA and/or kV according to patient size and/or use of iterative reconstruction technique. COMPARISON:  Head CT 03/14/2023.  Cervical spine CT 03/14/2023. FINDINGS: CT HEAD FINDINGS Brain: Generalized cerebral atrophy. Prominence of the ventricles and sulci, which appears commensurate. Patchy and ill-defined hypoattenuation within the cerebral white matter, nonspecific but compatible with mild chronic small vessel ischemic disease. There is no acute intracranial hemorrhage. No demarcated cortical infarct. No extra-axial fluid collection. No evidence of an intracranial mass. No midline shift. Vascular: No hyperdense vessel.  Atherosclerotic calcifications. Skull: No calvarial fracture or aggressive osseous lesion. Sinuses/Orbits: No mass or  acute finding within the imaged orbits. No significant paranasal sinus disease at the imaged levels. Other: Left anterior scalp, forehead and periorbital hematoma. CT CERVICAL SPINE FINDINGS Alignment: Nonspecific reversal of the expected cervical lordosis. Mild C2-C3, C4-C5, C6-C7, C7-T1, T1-T2 and T2-T3 grade 1 anterolisthesis. Skull base and vertebrae: The basion-dental and atlanto-dental intervals are maintained.No evidence of acute fracture to the cervical spine. Mild chronic T1 superior endplate vertebral compression deformity, unchanged from the prior cervical spine CT of 03/14/2023. Facet ankylosis on the right at C7-T1. Soft tissues and spinal canal: No prevertebral fluid or swelling.  No visible canal hematoma. Disc levels: Cervical spondylosis with multilevel disc space narrowing, disc bulges/central disc protrusions, posterior disc osteophyte complexes, uncovertebral hypertrophy and facet arthropathy. Disc space narrowing is greatest at C3-C4, C5-C6 and C6-C7 (advanced at these levels). No appreciable high-grade spinal canal stenosis. Multilevel bony neural foraminal narrowing. Degenerative changes also present at the C1-C2 articulation. Upper chest: No consolidation within the imaged lung apices. No visible pneumothorax. IMPRESSION: CT head: 1.  No evidence of an acute intracranial abnormality. 2. Left anterior scalp, forehead and periorbital hematoma. 3. Cerebral atrophy and cerebral white matter chronic small vessel ischemic disease. CT cervical spine: 1. No evidence of an acute cervical spine fracture. 2. Nonspecific reversal of the expected cervical lordosis. 3. Mild grade 1 anterolisthesis at C2-C3, C4-C5, C6-C7, C7-T1, T1-T2 and T2-T3. 4. Cervical spondylosis as described. 5. Facet ankylosis on the right at C7-T1. 6. Mild chronic T1 superior endplate vertebral compression deformity, unchanged from the prior cervical spine CT of 03/14/2023. Electronically Signed   By: Jackey Loge D.O.   On:  08/24/2023 09:58   CT Cervical Spine Wo Contrast Result Date: 08/24/2023 CLINICAL DATA:  Provided history: Head trauma, minor. Neck trauma. Additional history provided: Unwitnessed fall (with head strike). Headache. Forehead contusion. EXAM: CT HEAD WITHOUT CONTRAST CT CERVICAL SPINE WITHOUT CONTRAST TECHNIQUE: Multidetector CT imaging of the head and cervical spine was performed following the standard protocol without intravenous contrast. Multiplanar CT image reconstructions of the cervical spine were also generated. RADIATION DOSE REDUCTION: This exam was performed according to the departmental dose-optimization program which includes automated exposure control, adjustment of the mA and/or kV according to patient size and/or use of iterative reconstruction technique. COMPARISON:  Head CT 03/14/2023.  Cervical spine CT 03/14/2023. FINDINGS: CT HEAD FINDINGS Brain: Generalized cerebral atrophy. Prominence of the ventricles and sulci, which appears commensurate. Patchy and ill-defined hypoattenuation within the cerebral white matter, nonspecific but compatible with mild chronic small vessel ischemic disease. There is no acute intracranial hemorrhage. No demarcated cortical infarct. No extra-axial fluid collection. No evidence of an intracranial mass. No midline shift. Vascular: No hyperdense vessel.  Atherosclerotic calcifications. Skull: No calvarial fracture or aggressive osseous lesion. Sinuses/Orbits: No mass or acute finding within the imaged orbits. No significant paranasal sinus disease at the imaged levels. Other: Left anterior scalp, forehead and periorbital hematoma. CT CERVICAL SPINE FINDINGS Alignment: Nonspecific reversal of the expected cervical lordosis. Mild C2-C3, C4-C5, C6-C7, C7-T1, T1-T2 and T2-T3 grade 1 anterolisthesis. Skull base and vertebrae: The basion-dental and atlanto-dental intervals are maintained.No evidence of acute fracture to the cervical spine. Mild chronic T1 superior endplate  vertebral compression deformity, unchanged from the prior cervical spine CT of 03/14/2023. Facet ankylosis on the right at C7-T1. Soft tissues and spinal canal: No prevertebral fluid or swelling. No visible canal hematoma. Disc levels: Cervical spondylosis with multilevel disc space narrowing, disc bulges/central disc protrusions, posterior disc osteophyte complexes, uncovertebral hypertrophy and facet arthropathy. Disc space narrowing is greatest at C3-C4, C5-C6 and C6-C7 (advanced at these levels). No appreciable high-grade spinal canal stenosis. Multilevel bony neural foraminal narrowing. Degenerative changes also present at the C1-C2 articulation. Upper chest: No consolidation within the imaged lung apices. No visible pneumothorax. IMPRESSION: CT head: 1.  No evidence of an acute intracranial abnormality. 2. Left anterior scalp, forehead and periorbital hematoma. 3. Cerebral atrophy and cerebral white matter chronic small vessel ischemic disease. CT cervical spine: 1. No evidence of an acute cervical spine fracture. 2. Nonspecific reversal of the expected cervical lordosis. 3. Mild  grade 1 anterolisthesis at C2-C3, C4-C5, C6-C7, C7-T1, T1-T2 and T2-T3. 4. Cervical spondylosis as described. 5. Facet ankylosis on the right at C7-T1. 6. Mild chronic T1 superior endplate vertebral compression deformity, unchanged from the prior cervical spine CT of 03/14/2023. Electronically Signed   By: Jackey Loge D.O.   On: 08/24/2023 09:58    Procedures Procedures    Medications Ordered in ED Medications  acetaminophen (TYLENOL) tablet 1,000 mg (1,000 mg Oral Given 08/24/23 1210)    ED Course/ Medical Decision Making/ A&P Clinical Course as of 08/24/23 1655  Thu Aug 24, 2023  1150 Viewed results of workup with patient.  She is comfortable plan for returning back to facility.  I placed a call to her granddaughter who is listed as her emergency contact and left a message [MB]  1255 I was able to get a call back from  the patient's granddaughter and she is going to come see if somebody can give her a ride back to the facility. [MB]    Clinical Course User Index [MB] Terrilee Files, MD                                 Medical Decision Making Amount and/or Complexity of Data Reviewed Labs: ordered. Radiology: ordered.  Risk OTC drugs.   This patient complains of fall head injury; this involves an extensive number of treatment Options and is a complaint that carries with it a high risk of complications and morbidity. The differential includes fracture, contusion, intracranial bleed, chest injury, extremity fracture  I ordered, reviewed and interpreted labs, which included CBC normal chemistries unremarkable troponin flat I ordered medication oral Tylenol and reviewed PMP when indicated. I ordered imaging studies which included CT head and cervical spine, chest x-ray, bilateral knee x-ray and I independently    visualized and interpreted imaging which showed soft tissue swelling head, no acute traumatic findings otherwise Additional history obtained from EMS and patient's granddaughter Previous records obtained and reviewed in epic including recent discharge summary and PCP notes Cardiac monitoring reviewed, sinus rhythm Social determinants considered, patient with decreased medical literacy, depression, physically inactive Critical Interventions: None  After the interventions stated above, I reevaluated the patient and found patient to be awake alert neuro intact Admission and further testing considered, no indications for admission.  Will return her back to her facility where they can continue to observe her         Final Clinical Impression(s) / ED Diagnoses Final diagnoses:  Injury of head, initial encounter  Fall, initial encounter  Chest wall pain  Contusion of forehead, initial encounter    Rx / DC Orders ED Discharge Orders     None         Terrilee Files,  MD 08/24/23 1657

## 2023-08-24 NOTE — Discharge Instructions (Signed)
 You are seen in the emergency department for evaluation of injuries from a fall.  You had a CAT scan of your head and neck along with x-rays of chest and both of your knees that did not show any acute traumatic findings.  Please use ice to the bruise on your head and Tylenol as needed for pain.  Follow-up with your primary care doctor.  Return to the emergency department if any worsening or concerning symptoms

## 2023-08-24 NOTE — ED Notes (Signed)
 Patient transported to CT

## 2023-08-24 NOTE — ED Triage Notes (Signed)
 Pt arrives to triage via EMS. Pt had an unwitnessed fall this morning around 0730. Pt is unsure if she lost consciousness. + head strike, no thinners. Pt endorses left sided rib pain and headache. Pt has contusion on left side of forehead, bruising and swelling noted.

## 2023-08-24 NOTE — ED Notes (Signed)
 Messaged provider about patient's pain increasing.

## 2023-08-28 DIAGNOSIS — I129 Hypertensive chronic kidney disease with stage 1 through stage 4 chronic kidney disease, or unspecified chronic kidney disease: Secondary | ICD-10-CM | POA: Diagnosis not present

## 2023-08-28 DIAGNOSIS — F419 Anxiety disorder, unspecified: Secondary | ICD-10-CM | POA: Diagnosis not present

## 2023-08-28 DIAGNOSIS — E785 Hyperlipidemia, unspecified: Secondary | ICD-10-CM | POA: Diagnosis not present

## 2023-08-28 DIAGNOSIS — E1143 Type 2 diabetes mellitus with diabetic autonomic (poly)neuropathy: Secondary | ICD-10-CM | POA: Diagnosis not present

## 2023-08-28 DIAGNOSIS — E1122 Type 2 diabetes mellitus with diabetic chronic kidney disease: Secondary | ICD-10-CM | POA: Diagnosis not present

## 2023-08-28 DIAGNOSIS — N1832 Chronic kidney disease, stage 3b: Secondary | ICD-10-CM | POA: Diagnosis not present

## 2023-08-28 DIAGNOSIS — K219 Gastro-esophageal reflux disease without esophagitis: Secondary | ICD-10-CM | POA: Diagnosis not present

## 2023-08-28 DIAGNOSIS — G894 Chronic pain syndrome: Secondary | ICD-10-CM | POA: Diagnosis not present

## 2023-08-28 DIAGNOSIS — R296 Repeated falls: Secondary | ICD-10-CM | POA: Diagnosis not present

## 2023-08-28 DIAGNOSIS — I480 Paroxysmal atrial fibrillation: Secondary | ICD-10-CM | POA: Diagnosis not present

## 2023-08-28 DIAGNOSIS — F329 Major depressive disorder, single episode, unspecified: Secondary | ICD-10-CM | POA: Diagnosis not present

## 2023-08-28 DIAGNOSIS — J449 Chronic obstructive pulmonary disease, unspecified: Secondary | ICD-10-CM | POA: Diagnosis not present

## 2023-08-28 DIAGNOSIS — M4800 Spinal stenosis, site unspecified: Secondary | ICD-10-CM | POA: Diagnosis not present

## 2023-08-28 DIAGNOSIS — M810 Age-related osteoporosis without current pathological fracture: Secondary | ICD-10-CM | POA: Diagnosis not present

## 2023-08-28 DIAGNOSIS — E538 Deficiency of other specified B group vitamins: Secondary | ICD-10-CM | POA: Diagnosis not present

## 2023-08-28 DIAGNOSIS — M5431 Sciatica, right side: Secondary | ICD-10-CM | POA: Diagnosis not present

## 2023-09-03 DIAGNOSIS — F5101 Primary insomnia: Secondary | ICD-10-CM | POA: Diagnosis not present

## 2023-09-03 DIAGNOSIS — J449 Chronic obstructive pulmonary disease, unspecified: Secondary | ICD-10-CM | POA: Diagnosis not present

## 2023-09-03 DIAGNOSIS — I10 Essential (primary) hypertension: Secondary | ICD-10-CM | POA: Diagnosis not present

## 2023-09-03 DIAGNOSIS — M6281 Muscle weakness (generalized): Secondary | ICD-10-CM | POA: Diagnosis not present

## 2023-09-04 DIAGNOSIS — E1143 Type 2 diabetes mellitus with diabetic autonomic (poly)neuropathy: Secondary | ICD-10-CM | POA: Diagnosis not present

## 2023-09-04 DIAGNOSIS — N1832 Chronic kidney disease, stage 3b: Secondary | ICD-10-CM | POA: Diagnosis not present

## 2023-09-04 DIAGNOSIS — J449 Chronic obstructive pulmonary disease, unspecified: Secondary | ICD-10-CM | POA: Diagnosis not present

## 2023-09-04 DIAGNOSIS — F419 Anxiety disorder, unspecified: Secondary | ICD-10-CM | POA: Diagnosis not present

## 2023-09-04 DIAGNOSIS — M810 Age-related osteoporosis without current pathological fracture: Secondary | ICD-10-CM | POA: Diagnosis not present

## 2023-09-04 DIAGNOSIS — F329 Major depressive disorder, single episode, unspecified: Secondary | ICD-10-CM | POA: Diagnosis not present

## 2023-09-04 DIAGNOSIS — I480 Paroxysmal atrial fibrillation: Secondary | ICD-10-CM | POA: Diagnosis not present

## 2023-09-04 DIAGNOSIS — E1122 Type 2 diabetes mellitus with diabetic chronic kidney disease: Secondary | ICD-10-CM | POA: Diagnosis not present

## 2023-09-04 DIAGNOSIS — G894 Chronic pain syndrome: Secondary | ICD-10-CM | POA: Diagnosis not present

## 2023-09-04 DIAGNOSIS — R296 Repeated falls: Secondary | ICD-10-CM | POA: Diagnosis not present

## 2023-09-04 DIAGNOSIS — K219 Gastro-esophageal reflux disease without esophagitis: Secondary | ICD-10-CM | POA: Diagnosis not present

## 2023-09-04 DIAGNOSIS — I129 Hypertensive chronic kidney disease with stage 1 through stage 4 chronic kidney disease, or unspecified chronic kidney disease: Secondary | ICD-10-CM | POA: Diagnosis not present

## 2023-09-04 DIAGNOSIS — M5431 Sciatica, right side: Secondary | ICD-10-CM | POA: Diagnosis not present

## 2023-09-04 DIAGNOSIS — M4800 Spinal stenosis, site unspecified: Secondary | ICD-10-CM | POA: Diagnosis not present

## 2023-09-04 DIAGNOSIS — E538 Deficiency of other specified B group vitamins: Secondary | ICD-10-CM | POA: Diagnosis not present

## 2023-09-04 DIAGNOSIS — E785 Hyperlipidemia, unspecified: Secondary | ICD-10-CM | POA: Diagnosis not present

## 2023-09-08 ENCOUNTER — Ambulatory Visit: Admitting: Family Medicine

## 2023-09-08 ENCOUNTER — Encounter: Payer: Self-pay | Admitting: Family Medicine

## 2023-09-08 VITALS — Wt 130.1 lb

## 2023-09-08 DIAGNOSIS — R634 Abnormal weight loss: Secondary | ICD-10-CM

## 2023-09-08 DIAGNOSIS — R630 Anorexia: Secondary | ICD-10-CM

## 2023-09-08 DIAGNOSIS — S0003XD Contusion of scalp, subsequent encounter: Secondary | ICD-10-CM

## 2023-09-08 NOTE — Progress Notes (Signed)
   Subjective:    Patient ID: [name], female    DOB: May 25, 1923, 89 y.o.   MRN: [id number]  HPI Patient was seen at [facility] which is a long-term care facility Patient is in assisted living She recently took a fall from her bed striking her head She had a CAT scan of her head and neck x-rays of her knee and chest overall she is doing better the hematoma on the left side of her head quite swollen but is getting better firm area underneath She states she is not having headaches.  Her appetite is fair some days better than others Energy level fair She does not eat as much as she used to I spoke with the care facility nurse her weight has been consistently 126 pounds over the last several months this most recent month it was lower but her ER visit showed her weight was actually more like 129 pounds she denies any breathing troubles chest pain She has a history of bowel blockage but recently no nausea vomiting and her bowels are moving okay   Review of Systems     Objective:   Physical Exam Lungs clear heart rate controlled pulse normal skin warm dry hematoma no in the left side of her head       Assessment & Plan:  Forehead hematoma this could take weeks to go down to some degree there may be some calcification that stays We did discuss ways to try to prevent falls Also discussed trying to do a good job of eating as best as possible to hold her weight Will plan to do a follow-up with her within 8 to 10 weeks No lab work indicated today

## 2023-09-10 ENCOUNTER — Encounter: Payer: Self-pay | Admitting: Family Medicine

## 2023-09-12 ENCOUNTER — Other Ambulatory Visit: Payer: Self-pay | Admitting: Family Medicine

## 2023-09-14 DIAGNOSIS — E538 Deficiency of other specified B group vitamins: Secondary | ICD-10-CM | POA: Diagnosis not present

## 2023-09-14 DIAGNOSIS — M810 Age-related osteoporosis without current pathological fracture: Secondary | ICD-10-CM | POA: Diagnosis not present

## 2023-09-14 DIAGNOSIS — F329 Major depressive disorder, single episode, unspecified: Secondary | ICD-10-CM | POA: Diagnosis not present

## 2023-09-14 DIAGNOSIS — M5431 Sciatica, right side: Secondary | ICD-10-CM | POA: Diagnosis not present

## 2023-09-14 DIAGNOSIS — K219 Gastro-esophageal reflux disease without esophagitis: Secondary | ICD-10-CM | POA: Diagnosis not present

## 2023-09-14 DIAGNOSIS — N1832 Chronic kidney disease, stage 3b: Secondary | ICD-10-CM | POA: Diagnosis not present

## 2023-09-14 DIAGNOSIS — E1143 Type 2 diabetes mellitus with diabetic autonomic (poly)neuropathy: Secondary | ICD-10-CM | POA: Diagnosis not present

## 2023-09-14 DIAGNOSIS — F419 Anxiety disorder, unspecified: Secondary | ICD-10-CM | POA: Diagnosis not present

## 2023-09-14 DIAGNOSIS — I129 Hypertensive chronic kidney disease with stage 1 through stage 4 chronic kidney disease, or unspecified chronic kidney disease: Secondary | ICD-10-CM | POA: Diagnosis not present

## 2023-09-14 DIAGNOSIS — R296 Repeated falls: Secondary | ICD-10-CM | POA: Diagnosis not present

## 2023-09-14 DIAGNOSIS — G894 Chronic pain syndrome: Secondary | ICD-10-CM | POA: Diagnosis not present

## 2023-09-14 DIAGNOSIS — M4800 Spinal stenosis, site unspecified: Secondary | ICD-10-CM | POA: Diagnosis not present

## 2023-09-14 DIAGNOSIS — J449 Chronic obstructive pulmonary disease, unspecified: Secondary | ICD-10-CM | POA: Diagnosis not present

## 2023-09-14 DIAGNOSIS — E785 Hyperlipidemia, unspecified: Secondary | ICD-10-CM | POA: Diagnosis not present

## 2023-09-14 DIAGNOSIS — E1122 Type 2 diabetes mellitus with diabetic chronic kidney disease: Secondary | ICD-10-CM | POA: Diagnosis not present

## 2023-09-14 DIAGNOSIS — I480 Paroxysmal atrial fibrillation: Secondary | ICD-10-CM | POA: Diagnosis not present

## 2023-09-21 DIAGNOSIS — R600 Localized edema: Secondary | ICD-10-CM | POA: Diagnosis not present

## 2023-09-21 DIAGNOSIS — I10 Essential (primary) hypertension: Secondary | ICD-10-CM | POA: Diagnosis not present

## 2023-09-21 DIAGNOSIS — E039 Hypothyroidism, unspecified: Secondary | ICD-10-CM | POA: Diagnosis not present

## 2023-09-21 DIAGNOSIS — I4891 Unspecified atrial fibrillation: Secondary | ICD-10-CM | POA: Diagnosis not present

## 2023-10-03 DIAGNOSIS — I10 Essential (primary) hypertension: Secondary | ICD-10-CM | POA: Diagnosis not present

## 2023-10-03 DIAGNOSIS — F5101 Primary insomnia: Secondary | ICD-10-CM | POA: Diagnosis not present

## 2023-10-03 DIAGNOSIS — M6281 Muscle weakness (generalized): Secondary | ICD-10-CM | POA: Diagnosis not present

## 2023-10-03 DIAGNOSIS — J449 Chronic obstructive pulmonary disease, unspecified: Secondary | ICD-10-CM | POA: Diagnosis not present

## 2023-10-06 ENCOUNTER — Telehealth: Payer: Self-pay | Admitting: *Deleted

## 2023-10-06 ENCOUNTER — Other Ambulatory Visit: Payer: Self-pay | Admitting: Family Medicine

## 2023-10-06 MED ORDER — CETIRIZINE HCL 5 MG PO TABS: 5.0000 mg | ORAL_TABLET | Freq: Every day | ORAL | 3 refills | Status: DC

## 2023-10-06 NOTE — Telephone Encounter (Signed)
 Returned call to let grand daughter know medication has been sent in, mailbox is full .

## 2023-10-06 NOTE — Telephone Encounter (Signed)
 Copied from CRM [id number]. Topic: Clinical - Medication Question >> Oct 05, 2023  4:09 PM Fonda T wrote: Reason for CRM: Leone Ralphs, patient granddaughter is calling, (on [place]), she is requesting PCP, to prescribe or recommend allergy medication for congestion.  Patient resides at [facility] [place].   Preferred Pharmacy:  [facility] - [place], [place] - [address] [place] [address] Phone: [phone] Fax: [phone] Hours: Not open 24 hours   Patient granddaughter, Elon Hakim is requesting a return call ph. [phone]

## 2023-10-06 NOTE — Telephone Encounter (Signed)
Tommie Sams, DO     Rx sent in.

## 2023-10-12 ENCOUNTER — Encounter (HOSPITAL_COMMUNITY): Payer: Self-pay | Admitting: Emergency Medicine

## 2023-10-12 ENCOUNTER — Emergency Department (HOSPITAL_COMMUNITY)
Admission: EM | Admit: 2023-10-12 | Discharge: 2023-10-13 | Disposition: A | Discharge status: Home / Self Care | Attending: Emergency Medicine | Admitting: Emergency Medicine

## 2023-10-12 ENCOUNTER — Ambulatory Visit: Payer: Self-pay

## 2023-10-12 ENCOUNTER — Other Ambulatory Visit: Payer: Self-pay

## 2023-10-12 ENCOUNTER — Emergency Department (HOSPITAL_COMMUNITY)

## 2023-10-12 DIAGNOSIS — K5730 Diverticulosis of large intestine without perforation or abscess without bleeding: Secondary | ICD-10-CM | POA: Diagnosis not present

## 2023-10-12 DIAGNOSIS — I10 Essential (primary) hypertension: Secondary | ICD-10-CM | POA: Insufficient documentation

## 2023-10-12 DIAGNOSIS — Z79899 Other long term (current) drug therapy: Secondary | ICD-10-CM | POA: Insufficient documentation

## 2023-10-12 DIAGNOSIS — I129 Hypertensive chronic kidney disease with stage 1 through stage 4 chronic kidney disease, or unspecified chronic kidney disease: Secondary | ICD-10-CM | POA: Diagnosis not present

## 2023-10-12 DIAGNOSIS — R197 Diarrhea, unspecified: Secondary | ICD-10-CM | POA: Insufficient documentation

## 2023-10-12 DIAGNOSIS — R110 Nausea: Secondary | ICD-10-CM | POA: Diagnosis not present

## 2023-10-12 DIAGNOSIS — R1084 Generalized abdominal pain: Secondary | ICD-10-CM | POA: Diagnosis not present

## 2023-10-12 DIAGNOSIS — Z853 Personal history of malignant neoplasm of breast: Secondary | ICD-10-CM | POA: Insufficient documentation

## 2023-10-12 DIAGNOSIS — N390 Urinary tract infection, site not specified: Secondary | ICD-10-CM | POA: Diagnosis not present

## 2023-10-12 DIAGNOSIS — R1030 Lower abdominal pain, unspecified: Secondary | ICD-10-CM | POA: Diagnosis not present

## 2023-10-12 DIAGNOSIS — N281 Cyst of kidney, acquired: Secondary | ICD-10-CM | POA: Diagnosis not present

## 2023-10-12 LAB — COMPREHENSIVE METABOLIC PANEL WITH GFR
ALT: 12 U/L (ref 0–44)
AST: 14 U/L — ABNORMAL LOW (ref 15–41)
Albumin: 3.8 g/dL (ref 3.5–5.0)
Alkaline Phosphatase: 81 U/L (ref 38–126)
Anion gap: 7 (ref 5–15)
BUN: 27 mg/dL — ABNORMAL HIGH (ref 8–23)
CO2: 21 mmol/L — ABNORMAL LOW (ref 22–32)
Calcium: 9 mg/dL (ref 8.9–10.3)
Chloride: 109 mmol/L (ref 98–111)
Creatinine, Ser: 1.33 mg/dL — ABNORMAL HIGH (ref 0.44–1.00)
GFR, Estimated: 36 mL/min — ABNORMAL LOW (ref >60)
Glucose, Bld: 109 mg/dL — ABNORMAL HIGH (ref 70–99)
Potassium: 4.4 mmol/L (ref 3.5–5.1)
Sodium: 137 mmol/L (ref 135–145)
Total Bilirubin: 0.5 mg/dL (ref 0.0–1.2)
Total Protein: 6.7 g/dL (ref 6.5–8.1)

## 2023-10-12 LAB — C DIFFICILE QUICK SCREEN W PCR REFLEX
C Diff antigen: NEGATIVE
C Diff interpretation: NOT DETECTED
C Diff toxin: NEGATIVE

## 2023-10-12 LAB — CBC WITH DIFFERENTIAL/PLATELET
Abs Immature Granulocytes: 0.07 10*3/uL (ref 0.00–0.07)
Basophils Absolute: 0 10*3/uL (ref 0.0–0.1)
Basophils Relative: 0 %
Eosinophils Absolute: 0.3 10*3/uL (ref 0.0–0.5)
Eosinophils Relative: 3 %
HCT: 35.8 % — ABNORMAL LOW (ref 36.0–46.0)
Hemoglobin: 11.3 g/dL — ABNORMAL LOW (ref 12.0–15.0)
Immature Granulocytes: 1 %
Lymphocytes Relative: 19 %
Lymphs Abs: 1.9 10*3/uL (ref 0.7–4.0)
MCH: 28.5 pg (ref 26.0–34.0)
MCHC: 31.6 g/dL (ref 30.0–36.0)
MCV: 90.4 fL (ref 80.0–100.0)
Monocytes Absolute: 0.8 10*3/uL (ref 0.1–1.0)
Monocytes Relative: 8 %
Neutro Abs: 7.2 10*3/uL (ref 1.7–7.7)
Neutrophils Relative %: 69 %
Platelets: 251 10*3/uL (ref 150–400)
RBC: 3.96 MIL/uL (ref 3.87–5.11)
RDW: 13.4 % (ref 11.5–15.5)
WBC: 10.2 10*3/uL (ref 4.0–10.5)
nRBC: 0 % (ref 0.0–0.2)

## 2023-10-12 LAB — POC OCCULT BLOOD, ED

## 2023-10-12 LAB — MAGNESIUM: Magnesium: 2.1 mg/dL (ref 1.7–2.4)

## 2023-10-12 LAB — CBG MONITORING, ED: Glucose-Capillary: 108 mg/dL — ABNORMAL HIGH (ref 70–99)

## 2023-10-12 MED ORDER — IOHEXOL 300 MG/ML  SOLN
80.0000 mL | Freq: Once | INTRAMUSCULAR | Status: AC | PRN
  Administered 2023-10-12: 80 mL via INTRAVENOUS

## 2023-10-12 NOTE — Telephone Encounter (Signed)
 Chief Complaint: Abdominal pain Symptoms: nausea, diarrhea Frequency: started today Pertinent Negatives: Patient denies fever, CP, SOB Disposition: [x] ED /[] Urgent Care (no appt availability in office) / [] Appointment(In office/virtual)/ []  [place] [facility]/ [] Home Care/ [] Refused Recommended Disposition /[] [facility]/ []  Follow-up with PCP Additional Notes: Rose from [employer] called for patient to report abdominal pain, nausea and diarrhea. Patient has abdominal pain on the right side-3 out of 10 pain level with no movement. Any attempt to move by patient-pain level 8 out of 10. Patient is reporting to staff extreme nausea along with diarrhea that started today. Staff reports they faxed over requests for medication for patient-no orders for nausea or anti diarrheal medication. Patient is requesting to be sent to the ED. Per protocol, patient is recommended to be seen in the ED. Rose states she will send the patient out to the Emergency Department. Verbalized understanding and all questions answered.    Copied from CRM [id number]. Topic: Clinical - Red Word Triage >> Oct 12, 2023  4:17 PM Elle L wrote: Red Word that prompted transfer to Nurse Triage: Rose with [facility] assisted living states that the patient is having extreme nausea, diarrhea and stomach pain where she had prior surgery. She states she faxed over order requests for the patient to have prescriptions. Reason for Disposition  [1] SEVERE pain AND [2] age > 60 years  Answer Assessment - Initial Assessment Questions 1. LOCATION: "Where does it hurt?"      Right sided abdominal pain 2. RADIATION: "Does the pain shoot anywhere else?" (e.g., chest, back)     No radiation 3. ONSET: "When did the pain begin?" (e.g., minutes, hours or days ago)      Last couple of days 4. SUDDEN: "Gradual or sudden onset?"     gradual 5. PATTERN "Does the pain come and go, or is it constant?"    - If  it comes and goes: "How long does it last?" "Do you have pain now?"     (Note: Comes and goes means the pain is intermittent. It goes away completely between bouts.)    - If constant: "Is it getting better, staying the same, or getting worse?"      (Note: Constant means the pain never goes away completely; most serious pain is constant and gets worse.)      constant 6. SEVERITY: "How bad is the pain?"  (e.g., Scale 1-10; mild, moderate, or severe)    - MILD (1-3): Doesn't interfere with normal activities, abdomen soft and not tender to touch.     - MODERATE (4-7): Interferes with normal activities or awakens from sleep, abdomen tender to touch.     - SEVERE (8-10): Excruciating pain, doubled over, unable to do any normal activities.       Pain is 3 out of 10 with no movement, 8 out of 10 with any movement 7. RECURRENT SYMPTOM: "Have you ever had this type of stomach pain before?" If Yes, ask: "When was the last time?" and "What happened that time?"      no 8. CAUSE: "What do you think is causing the stomach pain?"     unsure 9. RELIEVING/AGGRAVATING FACTORS: "What makes it better or worse?" (e.g., antacids, bending or twisting motion, bowel movement)     no 10. OTHER SYMPTOMS: "Do you have any other symptoms?" (e.g., back pain, diarrhea, fever, urination pain, vomiting)       Nausea, diarrhea  Protocols used: Abdominal Pain -  Female-A-AH

## 2023-10-12 NOTE — ED Notes (Signed)
 Patient transported to CT

## 2023-10-12 NOTE — ED Triage Notes (Signed)
 Pt bib [facility] with complaints of diarrhea since 1 hour after lunch X 5 occurences. Pt reports nausea earlier in the day that has how passed. Pt has a reported hx of a bowel blockage requiring surgery in January of 2025. Pt reports she had a "tube" in her stomach that was removed last month that has been sore.

## 2023-10-12 NOTE — ED Provider Notes (Signed)
 [place] EMERGENCY DEPARTMENT AT [facility] Provider Note   CSN: [id number] Arrival date & time: 10/12/23  1658     History {Add pertinent medical, surgical, social history, OB history to HPI:1} Chief Complaint  Patient presents with   Diarrhea    [name] is a 89 y.o. female with PMH as listed below who presents via [facility] with complaints of diarrhea since 1 hour after lunch X 5 occurences. Pt reports nausea and lower abdominal pain earlier in the day that has how passed. Pt has a reported hx of a bowel blockage requiring surgery in January of 2025. Pt reports she had a "tube" in her stomach that was removed last month that has been sore. No recent hospitalizations, no recent travel. Does take daily   Past Medical History:  Diagnosis Date   Abdominal pain, chronic, right lower quadrant    "ever since I had my appendix removed"   Anxiety    Atrial fibrillation (HCC)    Breast cancer (HCC)    Cancer (HCC)    left side   Chronic back pain    Edema    GERD (gastroesophageal reflux disease)    Hyperlipidemia    Hypertension    Osteopenia    Osteoporosis    Ovarian cyst 02/2015   left   Recurrent abdominal pain    LLQ       Home Medications Prior to Admission medications   Medication Sig Start Date End Date Taking? Authorizing Provider  cetirizine  (ZYRTEC ) 5 MG tablet Take 1 tablet (5 mg total) by mouth daily. 10/06/23   Cook, Jayce G, DO  acetaminophen  (TYLENOL ) 500 MG tablet Take 1 tablet (500 mg total) by mouth every 6 (six) hours as needed. 08/03/23   Bennet Brasil, MD  allopurinol  (ZYLOPRIM ) 100 MG tablet TAKE 1/2 TABLET(50MG ) BY MOUTH ONCE DAILY. 09/12/23   Bennet Brasil, MD  ALPRAZolam  (XANAX ) 0.25 MG tablet 1 nightly as needed insomnia may also have 1 during the day if needed for panic attack only use sparingly for this purpose 08/15/23   Bennet Brasil, MD  diltiazem  (CARDIZEM  CD) 180 MG 24 hr capsule Take 1 capsule (180 mg total) by mouth  daily. 07/17/23   Bennet Brasil, MD  estradiol  (ESTRACE ) 0.1 MG/GM vaginal cream Discard plastic applicator. LTC staff are to insert a blueberry size amount (approximately 1 gram) of cream on gloved fingertip 1 cm inside patient vagina Monday and Friday night at bedtime. For long term use. 07/10/23   Luking, Jackelyn Marvel, MD  GOODSENSE PAIN RELIEF EXTRA ST 500 MG tablet TAKE (1) TABLET BY MOUTH TWICE DAILY FOR BACK PAIN. 08/11/23   Bennet Brasil, MD  levothyroxine  (SYNTHROID ) 75 MCG tablet TAKE (1) TABLET BY MOUTH EVERY MORNING. **TAKE ON AN EMPTY STOMACH AT LEAST 30 MINUTES FROM FOOD AND OTHER MEDS** 06/27/23   Bennet Brasil, MD  Melatonin 3 MG SUBL TAKE (1) TABLET BY MOUTH AT BEDTIME. 08/11/23   Bennet Brasil, MD  Multiple Vitamins-Minerals (MULTIVITAMINS THER. W/MINERALS) TABS tablet Take 1 tablet by mouth daily.    [provider]  omeprazole  (PRILOSEC) 40 MG capsule Take 1 capsule (40 mg total) by mouth daily. 07/10/23   Bennet Brasil, MD  polyethylene glycol powder (GLYCOLAX /MIRALAX ) 17 GM/SCOOP powder Take 17 g by mouth daily.    [provider]  potassium chloride  (KLOR-CON  M) 10 MEQ tablet 1 on Monday morning and 1 on Friday morning 07/17/23  Luking, Jackelyn Marvel, MD  REFRESH 1.4-0.6 % SOLN INSTILL ONE DROP IN EACH EYE TWICE DAILY. 07/19/23   Bennet Brasil, MD  senna-docusate (SENOKOT-S) 8.6-50 MG tablet Take 1 tablet by mouth daily.    [provider]  sertraline  (ZOLOFT ) 50 MG tablet 1 and 1/2 daily 08/15/23   Bennet Brasil, MD  torsemide  (DEMADEX ) 20 MG tablet Take 1 on Monday morning and 1 on Friday morning 07/17/23   Bennet Brasil, MD  trimethoprim  (TRIMPEX ) 100 MG tablet TAKE (1) TABLET BY MOUTH ONCE DAILY. 09/12/23   Bennet Brasil, MD  UNABLE TO FIND 500 mg. Med Name: D-mannose    [provider]      Allergies    Amiodarone , Cephalosporins, Meloxicam , Robaxin  [methocarbamol ], Amoxicillin, Lovenox [enoxaparin sodium], Morphine, Morphine and codeine,  Penicillins, Sulfa antibiotics, Lipitor [atorvastatin], Lorazepam , Macrobid  [nitrofurantoin ], and Metoprolol     Review of Systems   Review of Systems A 10 point review of systems was performed and is negative unless otherwise reported in HPI.  Physical Exam Updated Vital Signs BP (!) 174/78   Pulse 78   Temp 98 F (36.7 C) (Oral)   Ht 5\' 2"  (1.575 m)   Wt 59 kg   SpO2 96%   BMI 23.79 kg/m  Physical Exam General: Normal appearing {Desc; female/female:11659}, lying in bed.  HEENT: PERRLA, Sclera anicteric, MMM, trachea midline.  Cardiology: RRR, no murmurs/rubs/gallops. BL radial and DP pulses equal bilaterally.  Resp: Normal respiratory rate and effort. CTAB, no wheezes, rhonchi, crackles.  Abd: Soft, non-tender, non-distended. No rebound tenderness or guarding.  GU: Deferred. MSK: No peripheral edema or signs of trauma. Extremities without deformity or TTP. No cyanosis or clubbing. Skin: warm, dry. No rashes or lesions. Back: No CVA tenderness Neuro: A&Ox4, CNs II-XII grossly intact. MAEs. Sensation grossly intact.  Psych: Normal mood and affect.   ED Results / Procedures / Treatments   Labs (all labs ordered are listed, but only abnormal results are displayed) Labs Reviewed - No data to display  EKG None  Radiology No results found.  Procedures Procedures  {Document cardiac monitor, telemetry assessment procedure when appropriate:1}  Medications Ordered in ED Medications - No data to display  ED Course/ Medical Decision Making/ A&P                          Medical Decision Making Amount and/or Complexity of Data Reviewed Labs: ordered. Radiology: ordered.    This patient presents to the ED for concern of ***, this involves an extensive number of treatment options, and is a complaint that carries with it a high risk of complications and morbidity.  I considered the following differential and admission for this acute, potentially life threatening condition.    MDM:    ***     Labs: I Ordered, and personally interpreted labs.  The pertinent results include:  ***  Imaging Studies ordered: I ordered imaging studies including *** I independently visualized and interpreted imaging. I agree with the radiologist interpretation  Additional history obtained from ***.  External records from outside source obtained and reviewed including ***  Cardiac Monitoring: The patient was maintained on a cardiac monitor.  I personally viewed and interpreted the cardiac monitored which showed an underlying rhythm of: ***  Reevaluation: After the interventions noted above, I reevaluated the patient and found that they have :{resolved/improved/worsened:23923::"improved"}  Social Determinants of Health: ***  Disposition:  ***  Co morbidities that complicate the  patient evaluation  Past Medical History:  Diagnosis Date   Abdominal pain, chronic, right lower quadrant    "ever since I had my appendix removed"   Anxiety    Atrial fibrillation (HCC)    Breast cancer (HCC)    Cancer (HCC)    left side   Chronic back pain    Edema    GERD (gastroesophageal reflux disease)    Hyperlipidemia    Hypertension    Osteopenia    Osteoporosis    Ovarian cyst 02/2015   left   Recurrent abdominal pain    LLQ     Medicines No orders of the defined types were placed in this encounter.   I have reviewed the patients home medicines and have made adjustments as needed  Problem List / ED Course: Problem List Items Addressed This Visit   None        {Document critical care time when appropriate:1} {Document review of labs and clinical decision tools ie heart score, Chads2Vasc2 etc:1}  {Document your independent review of radiology images, and any outside records:1} {Document your discussion with family members, caretakers, and with consultants:1} {Document social determinants of health affecting pt's care:1} {Document your decision making why or  why not admission, treatments were needed:1}  This note was created using dictation software, which may contain spelling or grammatical errors.

## 2023-10-13 ENCOUNTER — Telehealth: Payer: Self-pay | Admitting: Family Medicine

## 2023-10-13 LAB — URINALYSIS, ROUTINE W REFLEX MICROSCOPIC
Bilirubin Urine: NEGATIVE
Glucose, UA: NEGATIVE mg/dL
Hgb urine dipstick: NEGATIVE
Ketones, ur: NEGATIVE mg/dL
Leukocytes,Ua: NEGATIVE
Nitrite: NEGATIVE
Protein, ur: NEGATIVE mg/dL
Specific Gravity, Urine: 1.018 (ref 1.005–1.030)
pH: 6 (ref 5.0–8.0)

## 2023-10-13 LAB — POC OCCULT BLOOD, ED: Fecal Occult Bld: NEGATIVE

## 2023-10-13 NOTE — Telephone Encounter (Signed)
 See telephone message sent to nurses

## 2023-10-13 NOTE — Discharge Instructions (Signed)
 You were seen today for diarrhea.  Your workup is reassuring.  Make sure that you are staying hydrated.

## 2023-10-13 NOTE — Telephone Encounter (Signed)
 Nurses I received a fax from [facility] where the patient stays Basically it stated that patient is having diarrhea and nausea They are requesting order for Zofran  and order for Imodium  I wrote these out on the order form please fax this  Also please talk with the medical caretaker there/nurse-find out do we need to electronically send the prescriptions into the pharmacy and if so which pharmacy?  Or does the fax take care of that thank you  Also obviously the patient has severe troubles over the weekend may have to go to emergency department otherwise follow-up with us  next week if any ongoing troubles thank you

## 2023-10-13 NOTE — ED Provider Notes (Signed)
 Patient signed out pending urinalysis.  Urinalysis is not consistent with acute infection.  On recheck, patient is awake and alert.  C. difficile is negative.  She is anxious to be discharged.  Will plan for discharge back to living facility.   Rory Collard, MD 10/13/23 [phone]

## 2023-10-14 LAB — GASTROINTESTINAL PANEL BY PCR, STOOL (REPLACES STOOL CULTURE)

## 2023-10-17 ENCOUNTER — Other Ambulatory Visit: Payer: Self-pay

## 2023-10-17 MED ORDER — TRIMETHOPRIM 100 MG PO TABS: 100.0000 mg | ORAL_TABLET | Freq: Every day | ORAL | 5 refills | Status: DC

## 2023-10-17 NOTE — Telephone Encounter (Signed)
 So noted Recommend an 11:20 Rest Home visit or 4:10 rest home visit on one of my open slots within June

## 2023-10-19 NOTE — Telephone Encounter (Signed)
 Autumn made appt

## 2023-10-29 DIAGNOSIS — R918 Other nonspecific abnormal finding of lung field: Secondary | ICD-10-CM | POA: Diagnosis not present

## 2023-10-29 DIAGNOSIS — R109 Unspecified abdominal pain: Secondary | ICD-10-CM | POA: Diagnosis not present

## 2023-10-29 DIAGNOSIS — I10 Essential (primary) hypertension: Secondary | ICD-10-CM | POA: Diagnosis not present

## 2023-10-29 DIAGNOSIS — S2241XA Multiple fractures of ribs, right side, initial encounter for closed fracture: Secondary | ICD-10-CM | POA: Diagnosis not present

## 2023-10-29 DIAGNOSIS — K5750 Diverticulosis of both small and large intestine without perforation or abscess without bleeding: Secondary | ICD-10-CM | POA: Diagnosis not present

## 2023-10-29 DIAGNOSIS — R1084 Generalized abdominal pain: Secondary | ICD-10-CM | POA: Diagnosis not present

## 2023-10-29 DIAGNOSIS — E860 Dehydration: Secondary | ICD-10-CM | POA: Diagnosis not present

## 2023-10-29 DIAGNOSIS — I482 Chronic atrial fibrillation, unspecified: Secondary | ICD-10-CM | POA: Diagnosis not present

## 2023-10-29 DIAGNOSIS — K5900 Constipation, unspecified: Secondary | ICD-10-CM | POA: Diagnosis not present

## 2023-10-29 DIAGNOSIS — N2889 Other specified disorders of kidney and ureter: Secondary | ICD-10-CM | POA: Diagnosis not present

## 2023-10-31 DIAGNOSIS — N390 Urinary tract infection, site not specified: Secondary | ICD-10-CM | POA: Diagnosis not present

## 2023-11-03 DIAGNOSIS — M6281 Muscle weakness (generalized): Secondary | ICD-10-CM | POA: Diagnosis not present

## 2023-11-03 DIAGNOSIS — I10 Essential (primary) hypertension: Secondary | ICD-10-CM | POA: Diagnosis not present

## 2023-11-03 DIAGNOSIS — J449 Chronic obstructive pulmonary disease, unspecified: Secondary | ICD-10-CM | POA: Diagnosis not present

## 2023-11-03 DIAGNOSIS — F5101 Primary insomnia: Secondary | ICD-10-CM | POA: Diagnosis not present

## 2023-11-07 ENCOUNTER — Ambulatory Visit: Admitting: Family Medicine

## 2023-11-07 ENCOUNTER — Encounter: Payer: Self-pay | Admitting: Family Medicine

## 2023-11-07 VITALS — BP 151/76 | HR 76 | Temp 98.1°F | Ht 62.0 in

## 2023-11-07 DIAGNOSIS — D509 Iron deficiency anemia, unspecified: Secondary | ICD-10-CM | POA: Diagnosis not present

## 2023-11-07 DIAGNOSIS — F419 Anxiety disorder, unspecified: Secondary | ICD-10-CM

## 2023-11-07 DIAGNOSIS — R1030 Lower abdominal pain, unspecified: Secondary | ICD-10-CM | POA: Diagnosis not present

## 2023-11-07 DIAGNOSIS — N1832 Chronic kidney disease, stage 3b: Secondary | ICD-10-CM | POA: Diagnosis not present

## 2023-11-07 NOTE — Progress Notes (Signed)
   Subjective:    Patient ID: [name], female    DOB: January 31, 1924, 89 y.o.   MRN: [id number]  HPI follow up on long term health conditions  Discussed the use of AI scribe software for clinical note transcription with the patient, who gave verbal consent to proceed.  History of Present Illness   [name] is a 89 year old female who presents with abdominal pain and generalized joint pain.  She experiences pain in the right lower abdominal area, particularly when leaning forward or getting up. The discomfort sometimes wakes her up at night. She was previously seen at [facility] on June 8th for similar symptoms, where a CT scan and blood work were performed. She describes her stomach as 'awfully sore'.  She reports generalized joint pain, stating 'every bone I got hurts'. A caregiver applies a topical ointment to her knee, providing temporary relief. The pain is persistent and affects her daily life.  Her bowel movements are generally good, although she sometimes notices black stools. She also reports frequent urination with associated lower pelvic pain.  She has a history of multiple surgeries, which she believes contribute to her current pain levels. She has been in her current living facility three times and describes her mood as not as she would like, feeling bored and unable to participate in activities like bingo due to vision issues.  She recently moved to a bigger room, which has improved her breathing significantly. She uses a wheelchair but is able to walk around some during the day. She expresses a sense of boredom and lack of activities in her current living situation.        Review of Systems     Objective:   Physical Exam General-in no acute distress Eyes-no discharge Lungs-respiratory rate normal, CTA CV-no murmurs,RRR Extremities skin warm dry no edema Neuro grossly normal Behavior normal, alert Abdomen soft, lower abdominal tenderness more on the right  side than the left side no guarding or rebound       Assessment & Plan:  1. Stage 3b chronic kidney disease (HCC) (Primary) Stable, need to check as per above, check lab work to see if it is stable. - CBC - Basic metabolic panel with GFR - Hepatic function panel  2. Lower abdominal pain This is from scar tissue and a fat hernia that was noted on CAT scan no abscess.  Does not need antibiotics currently - CBC - Basic metabolic panel with GFR - Hepatic function panel  3. Anxiety Try to avoid any type of benzodiazepines as best as possible reassurance given  4. Iron deficiency anemia, unspecified iron deficiency anemia type CBC ordered to check hemoglobin. - CBC  Follow-up again in 3 months either here or at her rest home

## 2023-11-08 ENCOUNTER — Other Ambulatory Visit: Payer: Self-pay | Admitting: Family Medicine

## 2023-11-09 ENCOUNTER — Other Ambulatory Visit: Payer: Self-pay

## 2023-11-09 MED ORDER — ALPRAZOLAM 0.25 MG PO TABS: ORAL_TABLET | ORAL | 5 refills | Status: DC

## 2023-11-13 ENCOUNTER — Other Ambulatory Visit: Payer: Self-pay | Admitting: Family Medicine

## 2023-11-13 MED ORDER — THERA M PLUS PO TABS: 1.0000 | ORAL_TABLET | Freq: Every day | ORAL | 3 refills | Status: AC

## 2023-11-13 NOTE — Telephone Encounter (Signed)
 Copied from CRM [id number]. Topic: Clinical - Medication Refill >> Nov 13, 2023  1:05 PM Dawna HERO wrote: Medication:  Multiple Vitamins-Minerals (MULTIVITAMINS THER. W/MINERALS) TABS tablet  Has the patient contacted their pharmacy? Yes, pharmacy contacted us  (Agent: If no, request that the patient contact the pharmacy for the refill. If patient does not wish to contact the pharmacy document the reason why and proceed with request.) (Agent: If yes, when and what did the pharmacy advise?)  This is the patient's preferred pharmacy:  [facility], [place] - [address] [place] [address] Phone: [phone] Fax: [phone]  Is this the correct pharmacy for this prescription? Yes If no, delete pharmacy and type the correct one.   Has the prescription been filled recently? No  Is the patient out of the medication? Yes  Has the patient been seen for an appointment in the last year OR does the patient have an upcoming appointment? Yes  Can we respond through MyChart? Yes  Agent: Please be advised that Rx refills may take up to 3 business days. We ask that you follow-up with your pharmacy.

## 2023-11-14 ENCOUNTER — Telehealth: Payer: Self-pay

## 2023-11-14 NOTE — Telephone Encounter (Signed)
 Communication  Reason for CRM: Erminio wit [facility]- need clarification on vitamin prescription- need to know if it is supposed to have minerals or just vitamins [phone]

## 2023-11-14 NOTE — Telephone Encounter (Signed)
 Nurses I assume they are discussing a generic multivitamin 1 a day? The equivalent of a Centrum One-A-Day vitamin It would be fine with me if it has minerals in it

## 2023-11-15 NOTE — Telephone Encounter (Signed)
 [name] contacted message given

## 2023-11-16 DIAGNOSIS — I129 Hypertensive chronic kidney disease with stage 1 through stage 4 chronic kidney disease, or unspecified chronic kidney disease: Secondary | ICD-10-CM | POA: Diagnosis not present

## 2023-11-16 DIAGNOSIS — R3915 Urgency of urination: Secondary | ICD-10-CM | POA: Diagnosis not present

## 2023-11-16 DIAGNOSIS — N390 Urinary tract infection, site not specified: Secondary | ICD-10-CM | POA: Diagnosis not present

## 2023-11-16 DIAGNOSIS — R32 Unspecified urinary incontinence: Secondary | ICD-10-CM | POA: Diagnosis not present

## 2023-12-04 NOTE — Progress Notes (Unsigned)
 Name: [name] DOB: 1924/01/25 MRN: [id number]  History of Present Illness: Ms. [name] is a 89 y.o. female who presents today for follow up visit at [facility] [place]. She resides at a long term care facility ([facility] [address] [facility] [place]) and is accompanied to [name] by [name] (Life Enrichment Coordinator). Her granddaughter [name]) participated via speakerphone and assisted with providing history due to patient's dementia. - GU history: 1. Recurrent UTI. - Due to [name]' dementia it is difficult at times to discern whether she is experiencing symptomatic UTIs versus asymptomatic bacteriuria with vulvovaginal irritation. - Previously took D-mannose for UTI prevention. 2. Vaginal atrophy. 3. Renal cysts. - 10/24/2022: CT abdomen/pelvis w/o contrast showed Atrophic right kidney. No hydronephrosis. Unchanged low-attenuation left renal lesions, likely simple cysts, no specific follow-up imaging is recommended.   Urine culture results in past 12+ months: - 09/15/2022: Positive for Klebsiella pneumoniae - 11/25/2022: Negative (at SNF) - 03/15/2023: Positive for >100k Citrobacter freundii & >100k Klebsiella pneumoniae (at [facility]) - 04/25/2023: Positive for Enterococcus cloacae complex - 07/11/2023: Negative   At last visit on 06/07/2023: - Reported being more proactive about daily hydration and routine toileting throughout the day, which she states she is mostly able to do on her own without assistance.  - Notable decrease in fecal incontinence episodes after stopping laxatives for opioid-induced constipation (which had improved with reduced daily opioid use). - Advised the following: 1. Continue Trimethoprim  100 mg daily. 2. Continue nightly use of topical vaginal estrogen cream as prescribed. Staff at [facility] are instructed to apply topical vaginal estrogen cream daily as prescribed (patient is not to self-apply). Patient / family  strongly prefer a female staff member for this. 3. Staff at [facility] are instructed to encourage hydration with water at least once every 2-4 hours every day while patient is awake. 4. Staff at [facility] are instructed to encourage patient to use the toilet at least once every 4-6 hours while awake and assist her to the toilet as needed. 5. Return in about 6 months (around 12/05/2023) for UA, PVR, & f/u with Lauraine Oz NP (Tuesday or Thursday preferred).  Since last visit: She had abdominal surgery for bowel obstruction at [facility] in February 2025.  Today: She reports 0 UTIs since last visit.   She denies increased urinary urgency, frequency, dysuria, gross hematuria, straining to void, or sensations of incomplete emptying.  She has been using vaginal estrogen cream at a frequency of 3-4 time(s) per week.   She denies flank pain. Reports some persistent abdominal pain ever since her abdominal surgery in February 2025.  Medications: Current Outpatient Medications  Medication Sig Dispense Refill   acetaminophen  (TYLENOL ) 500 MG tablet Take 1 tablet (500 mg total) by mouth every 6 (six) hours as needed. (Patient taking differently: Take 500 mg by mouth in the morning and at bedtime. *May take 1 tablet every 6 hours as needed for pain) 50 tablet 4   allopurinol  (ZYLOPRIM ) 100 MG tablet TAKE 1/2 TABLET(50MG ) BY MOUTH ONCE DAILY. (Patient taking differently: Take 50 mg by mouth daily.) 15 tablet 4   ALPRAZolam  (XANAX ) 0.25 MG tablet 1 nightly as needed insomnia may also have 1 during the day if needed for panic attack only use sparingly for this purpose 35 tablet 5   cetirizine  (ZYRTEC ) 5 MG tablet Take 1 tablet (5 mg total) by mouth daily. 30 tablet 3   diltiazem  (CARDIZEM  CD) 120 MG  24 hr capsule Take 120 mg by mouth daily.     estradiol  (ESTRACE ) 0.1 MG/GM vaginal cream Discard plastic applicator. LTC staff are to insert a blueberry size amount (approximately 1  gram) of cream on gloved fingertip 1 cm inside patient vagina Monday and Friday night at bedtime. For long term use. (Patient taking differently: Place 1 g vaginally 2 (two) times a week. Discard plastic applicator. LTC staff are to insert a blueberry size amount (approximately 1 gram) of cream on gloved fingertip 1 cm inside patient vagina Monday and Friday night at bedtime. For long term use.) 30 g 3   levothyroxine  (SYNTHROID ) 75 MCG tablet TAKE (1) TABLET BY MOUTH EVERY MORNING. **TAKE ON AN EMPTY STOMACH AT LEAST 30 MINUTES FROM FOOD AND OTHER MEDS** (Patient taking differently: Take 75 mcg by mouth daily before breakfast.) 30 tablet 10   Melatonin 3 MG SUBL TAKE (1) TABLET BY MOUTH AT BEDTIME. (Patient taking differently: Take 3 mg by mouth at bedtime.) 30 tablet 4   Multiple Vitamins-Minerals (MULTIVITAMINS THER. W/MINERALS) TABS tablet Take 1 tablet by mouth daily. 30 tablet 3   omeprazole  (PRILOSEC) 40 MG capsule Take 1 capsule (40 mg total) by mouth daily. 30 capsule 5   polyethylene glycol powder (GLYCOLAX /MIRALAX ) 17 GM/SCOOP powder Take 17 g by mouth daily.     potassium chloride  (KLOR-CON  M) 10 MEQ tablet 1 on Monday morning and 1 on Friday morning (Patient taking differently: Take 10 mEq by mouth 2 (two) times a week. 1 on Monday morning and 1 on Friday morning) 10 tablet 5   REFRESH 1.4-0.6 % SOLN INSTILL ONE DROP IN EACH EYE TWICE DAILY. (Patient taking differently: Apply 1 drop to eye in the morning and at bedtime.) 50 each 0   sertraline  (ZOLOFT ) 50 MG tablet 1 and 1/2 daily (Patient taking differently: Take 75 mg by mouth daily.) 45 tablet 5   torsemide  (DEMADEX ) 20 MG tablet Take 1 on Monday morning and 1 on Friday morning (Patient taking differently: Take 20 mg by mouth 2 (two) times a week. Take 1 on Monday morning and 1 on Friday morning) 10 tablet 5   trimethoprim  (TRIMPEX ) 100 MG tablet Take 1 tablet (100 mg total) by mouth daily. TAKE (1) TABLET BY MOUTH ONCE DAILY. 30 tablet 5    No current facility-administered medications for this visit.    Allergies: Allergies  Allergen Reactions   Amiodarone  Itching   Cephalosporins Itching   Meloxicam  Itching   Robaxin  [Methocarbamol ] Itching   Amoxicillin Hives   Lovenox [Enoxaparin Sodium] Nausea And Vomiting   Morphine Other (See Comments)    Unknown Other reaction(s): Not available   Morphine And Codeine Itching   Penicillins Swelling        Sulfa Antibiotics Swelling   Lipitor [Atorvastatin] Other (See Comments)    Unknown:patient is not aware of this allergy   Lorazepam  Other (See Comments)    Exceptional fatigue    Macrobid  [Nitrofurantoin ] Other (See Comments)    Constipation   Metoprolol      hallucinations    Past Medical History:  Diagnosis Date   Abdominal pain, chronic, right lower quadrant    ever since I had my appendix removed   Anxiety    Atrial fibrillation (HCC)    Breast cancer (HCC)    Cancer (HCC)    left side   Chronic back pain    Edema    GERD (gastroesophageal reflux disease)    Hyperlipidemia    Hypertension  Osteopenia    Osteoporosis    Ovarian cyst 02/2015   left   Recurrent abdominal pain    LLQ   Past Surgical History:  Procedure Laterality Date   APPENDECTOMY     APPENDECTOMY     BREAST SURGERY Left    lumpectomy   CARDIOVERSION N/A 03/31/2016   Procedure: CARDIOVERSION;  Surgeon: Maude JAYSON Emmer, MD;  Location: [facility];  Service: Cardiovascular;  Laterality: N/A;   COLON RESECTION     COLON SURGERY     HERNIA REPAIR     HERNIA REPAIR     Family History  Problem Relation Age of Onset   Diabetes Mother    Heart disease Other    Arthritis Other    Cancer Other    Hyperlipidemia Brother    Social History   Socioeconomic History   Marital status: Widowed    Spouse name: Not on file   Number of children: 1   Years of education: 12   Highest education level: GED or equivalent  Occupational History    Employer: RETIRED  Tobacco Use   Smoking  status: Never    Passive exposure: Never   Smokeless tobacco: Never  Vaping Use   Vaping status: Never Used  Substance and Sexual Activity   Alcohol  use: No    Alcohol /week: 0.0 standard drinks of alcohol    Drug use: No   Sexual activity: Not Currently    Partners: Male  Other Topics Concern   Not on file  Social History Narrative   Not on file   Social Drivers of Health   Financial Resource Strain: Low Risk  (07/10/2023)   [facility] from [facility]   Overall Financial Resource Strain (CARDIA)    Difficulty of Paying Living Expenses: Not hard at all  Food Insecurity: No Food Insecurity (07/10/2023)   Received from [facility]   Hunger Vital Sign    Within the past 12 months, you worried that your food would run out before you got the money to buy more.: Never true    Within the past 12 months, the food you bought just didn't last and you didn't have money to get more.: Never true  Transportation Needs: No Transportation Needs (07/10/2023)   Received from [facility]   PRAPARE - Transportation    Lack of Transportation (Medical): No    Lack of Transportation (Non-Medical): No  Physical Activity: Inactive (07/10/2023)   [facility]   Exercise Vital Sign    On average, how many days per week do you engage in moderate to strenuous exercise (like a brisk walk)?: 0 days    On average, how many minutes do you engage in exercise at this level?: 0 min  Stress: No Stress Concern Present (07/10/2023)   Received from [facility] - Occupational Stress Questionnaire    Feeling of Stress : Not at all  Recent Concern: Stress - Stress Concern Present (06/16/2023)   [employer] of Occupational [facility] - Occupational Stress Questionnaire    Feeling of Stress : Rather much  Social Connections: Moderately Isolated (07/10/2023)   Received from [facility]   Social Connection and Isolation Panel    In a typical  week, how many times do you talk on the phone with family, friends, or neighbors?: More than three times a week    How often do you get together with friends or relatives?: More than three  times a week    How often do you attend church or religious services?: More than 4 times per year    Do you belong to any clubs or organizations such as church groups, unions, fraternal or athletic groups, or school groups?: No    How often do you attend meetings of the clubs or organizations you belong to?: Never    Are you married, widowed, divorced, separated, never married, or living with a partner?: Widowed  Intimate Partner Violence: Not At Risk (07/10/2023)   Received from [facility]   Humiliation, Afraid, Rape, and Kick questionnaire    Within the last year, have you been afraid of your partner or ex-partner?: No    Within the last year, have you been humiliated or emotionally abused in other ways by your partner or ex-partner?: No    Within the last year, have you been kicked, hit, slapped, or otherwise physically hurt by your partner or ex-partner?: No    Within the last year, have you been raped or forced to have any kind of sexual activity by your partner or ex-partner?: No    Review of Systems Constitutional: Patient denies any unintentional weight loss or change in strength lntegumentary: Patient denies any rashes or pruritus Cardiovascular: Patient denies chest pain or syncope Respiratory: Patient denies shortness of breath Gastrointestinal: As per HPI Musculoskeletal: Patient denies muscle cramps or weakness Neurologic: Patient denies convulsions or seizures Allergic/Immunologic: Patient denies recent allergic reaction(s) Hematologic/Lymphatic: Patient denies bleeding tendencies Endocrine: Patient denies heat/cold intolerance  GU: As per HPI.  OBJECTIVE Vitals:   12/05/23 1412  BP: (!) 154/66  Pulse: 86   There is no height or weight on file to calculate BMI.  Physical  Examination Constitutional: No obvious distress; patient is non-toxic appearing  Cardiovascular: No visible lower extremity edema.  Respiratory: The patient does not have audible wheezing/stridor; respirations do not appear labored  Gastrointestinal: Abdomen non-distended Musculoskeletal: Normal ROM of UEs  Skin: No obvious rashes/open sores  Neurologic: CN 2-12 grossly intact Psychiatric: Answered questions appropriately with normal affect  Hematologic/Lymphatic/Immunologic: No obvious bruises or sites of spontaneous bleeding  PVR: 0 ml  ASSESSMENT Recurrent UTI - Plan: Urinalysis, Routine w reflex microscopic  Atrophic vaginitis - Plan: Urinalysis, Routine w reflex microscopic  Urinary incontinence, unspecified type - Plan: Urinalysis, Routine w reflex microscopic, BLADDER SCAN AMB NON-IMAGING  Self-care deficit for hygiene - Plan: Urinalysis, Routine w reflex microscopic  Dementia, unspecified dementia severity, unspecified dementia type, unspecified whether behavioral, psychotic, or mood disturbance or anxiety (HCC) - Plan: Urinalysis, Routine w reflex microscopic  She is doing well with no acute findings. We agreed to continue current treatment regimen plan for follow up in 6 months or sooner if needed. Patient and POA verbalized understanding of and agreement with current plan. All questions were answered.  PLAN Advised the following: 1. Continue Trimethoprim  100 mg daily. 2. Continue nightly use of topical vaginal estrogen cream as prescribed. Staff at [facility] are instructed to apply topical vaginal estrogen cream daily as prescribed (patient is not to self-apply). Patient / family strongly prefer a female staff member for this. 3. Staff at [facility] are instructed to encourage hydration with water at least once every 2-4 hours every day while patient is awake. 4. Staff at [facility] are instructed to encourage patient to use  the toilet at least once every 4-6 hours while awake and assist her to the toilet as needed. 5.  Return in about 6 months (around 06/06/2024) for Recurrent UTI, with UA.  Orders Placed This Encounter  Procedures   Urinalysis, Routine w reflex microscopic   BLADDER SCAN AMB NON-IMAGING    It has been explained that the patient is to follow regularly with their PCP in addition to all other providers involved in their care and to follow instructions provided by these respective offices. Patient advised to contact urology [name] if any urologic-pertaining questions, concerns, new symptoms or problems arise in the interim period.  There are no Patient Instructions on file for this visit.  Electronically signed by:  Lauraine JAYSON Oz, FNP   12/05/23    2:49 PM

## 2023-12-05 ENCOUNTER — Ambulatory Visit (INDEPENDENT_AMBULATORY_CARE_PROVIDER_SITE_OTHER): Payer: Medicare Other | Admitting: Urology

## 2023-12-05 ENCOUNTER — Encounter: Payer: Self-pay | Admitting: Urology

## 2023-12-05 VITALS — BP 154/66 | HR 86

## 2023-12-05 DIAGNOSIS — R32 Unspecified urinary incontinence: Secondary | ICD-10-CM

## 2023-12-05 DIAGNOSIS — N390 Urinary tract infection, site not specified: Secondary | ICD-10-CM

## 2023-12-05 DIAGNOSIS — N952 Postmenopausal atrophic vaginitis: Secondary | ICD-10-CM | POA: Diagnosis not present

## 2023-12-05 DIAGNOSIS — Z87440 Personal history of urinary (tract) infections: Secondary | ICD-10-CM

## 2023-12-05 DIAGNOSIS — F0390 Unspecified dementia without behavioral disturbance: Secondary | ICD-10-CM | POA: Diagnosis not present

## 2023-12-05 DIAGNOSIS — Z741 Need for assistance with personal care: Secondary | ICD-10-CM | POA: Diagnosis not present

## 2023-12-05 LAB — BLADDER SCAN AMB NON-IMAGING: Scan Result: 0

## 2023-12-06 ENCOUNTER — Other Ambulatory Visit: Payer: Self-pay | Admitting: Family Medicine

## 2023-12-19 ENCOUNTER — Other Ambulatory Visit: Payer: Self-pay | Admitting: Family Medicine

## 2023-12-19 MED ORDER — ALPRAZOLAM 0.25 MG PO TABS: ORAL_TABLET | ORAL | 5 refills | Status: DC

## 2023-12-19 MED ORDER — ALPRAZOLAM 0.25 MG PO TABS: ORAL_TABLET | ORAL | 2 refills | Status: DC

## 2023-12-21 ENCOUNTER — Emergency Department (HOSPITAL_COMMUNITY)

## 2023-12-21 ENCOUNTER — Other Ambulatory Visit: Payer: Self-pay

## 2023-12-21 ENCOUNTER — Encounter (HOSPITAL_COMMUNITY): Payer: Self-pay

## 2023-12-21 ENCOUNTER — Emergency Department (HOSPITAL_COMMUNITY)
Admission: EM | Admit: 2023-12-21 | Discharge: 2023-12-22 | Disposition: A | Discharge status: Home / Self Care | Source: Skilled Nursing Facility | Attending: Emergency Medicine | Admitting: Emergency Medicine

## 2023-12-21 DIAGNOSIS — M47816 Spondylosis without myelopathy or radiculopathy, lumbar region: Secondary | ICD-10-CM | POA: Diagnosis not present

## 2023-12-21 DIAGNOSIS — S0990XA Unspecified injury of head, initial encounter: Secondary | ICD-10-CM | POA: Diagnosis not present

## 2023-12-21 DIAGNOSIS — G9389 Other specified disorders of brain: Secondary | ICD-10-CM | POA: Diagnosis not present

## 2023-12-21 DIAGNOSIS — S42001A Fracture of unspecified part of right clavicle, initial encounter for closed fracture: Secondary | ICD-10-CM | POA: Diagnosis not present

## 2023-12-21 DIAGNOSIS — W06XXXA Fall from bed, initial encounter: Secondary | ICD-10-CM | POA: Insufficient documentation

## 2023-12-21 DIAGNOSIS — M160 Bilateral primary osteoarthritis of hip: Secondary | ICD-10-CM | POA: Diagnosis not present

## 2023-12-21 DIAGNOSIS — I10 Essential (primary) hypertension: Secondary | ICD-10-CM | POA: Insufficient documentation

## 2023-12-21 DIAGNOSIS — S4991XA Unspecified injury of right shoulder and upper arm, initial encounter: Secondary | ICD-10-CM | POA: Diagnosis not present

## 2023-12-21 DIAGNOSIS — M79603 Pain in arm, unspecified: Secondary | ICD-10-CM | POA: Diagnosis not present

## 2023-12-21 DIAGNOSIS — S42021A Displaced fracture of shaft of right clavicle, initial encounter for closed fracture: Secondary | ICD-10-CM | POA: Diagnosis not present

## 2023-12-21 DIAGNOSIS — I700 Atherosclerosis of aorta: Secondary | ICD-10-CM | POA: Diagnosis not present

## 2023-12-21 DIAGNOSIS — Z79899 Other long term (current) drug therapy: Secondary | ICD-10-CM | POA: Diagnosis not present

## 2023-12-21 DIAGNOSIS — S42024A Nondisplaced fracture of shaft of right clavicle, initial encounter for closed fracture: Secondary | ICD-10-CM | POA: Diagnosis not present

## 2023-12-21 DIAGNOSIS — Z853 Personal history of malignant neoplasm of breast: Secondary | ICD-10-CM | POA: Diagnosis not present

## 2023-12-21 DIAGNOSIS — R0781 Pleurodynia: Secondary | ICD-10-CM | POA: Insufficient documentation

## 2023-12-21 DIAGNOSIS — M25551 Pain in right hip: Secondary | ICD-10-CM | POA: Diagnosis not present

## 2023-12-21 DIAGNOSIS — M19011 Primary osteoarthritis, right shoulder: Secondary | ICD-10-CM | POA: Diagnosis not present

## 2023-12-21 DIAGNOSIS — M25572 Pain in left ankle and joints of left foot: Secondary | ICD-10-CM | POA: Diagnosis not present

## 2023-12-21 HISTORY — DX: Cognitive communication deficit: R41.841

## 2023-12-21 NOTE — ED Provider Notes (Signed)
**[name] De-Identified via Obfuscation**  Katherine [name]   CSN: [id number] Arrival date & time: 12/21/23  2233     Patient presents with: Katherine [name] Katherine [name] is a 89 y.o. female.  {Add pertinent medical, surgical, social history, OB history to HPI:32947} HPI     This a 89 year old female who presents after fall.  Patient reports that she fell out of bed.  She is unsure whether she hit her head.  Is not on any blood thinners that she knows of.  Mostly she is complaining of right shoulder and right rib pain.  She states that her right hip hurt as well but she was able to ambulate.  She is technically oriented.  Denies shortness of breath.  Prior to Admission medications   Medication Sig Start Date End Date Taking? Authorizing Provider  acetaminophen  (TYLENOL ) 500 MG tablet Take 1 tablet (500 mg total) by mouth every 6 (six) hours as needed. Patient taking differently: Take 500 mg by mouth in the morning and at bedtime. *May take 1 tablet every 6 hours as needed for pain 08/03/23   Alphonsa Glendia LABOR, MD  allopurinol  (ZYLOPRIM ) 100 MG tablet TAKE 1/2 TABLET(50MG ) BY MOUTH ONCE DAILY. Patient taking differently: Take 50 mg by mouth daily. 09/12/23   Alphonsa Glendia LABOR, MD  ALPRAZolam  (XANAX ) 0.25 MG tablet 1 nightly for insomnia (hold medication if patient asleep) 12/19/23   Alphonsa Glendia LABOR, MD  ALPRAZolam  (XANAX ) 0.25 MG tablet 1 tablet as necessary for panic attack 12/19/23   Alphonsa Glendia LABOR, MD  cetirizine  (ZYRTEC ) 5 MG tablet TAKE (1) TABLET BY MOUTH ONCE DAILY. 12/07/23   Cook, Jayce G, DO  diltiazem  (CARDIZEM  CD) 120 MG 24 hr capsule Take 120 mg by mouth daily.    [provider]  diltiazem  (CARDIZEM  CD) 180 MG 24 hr capsule TAKE (1) CAPSULE BY MOUTH ONCE DAILY. 12/07/23   Alphonsa Glendia LABOR, MD  estradiol  (ESTRACE ) 0.1 MG/GM vaginal cream Discard plastic applicator. LTC staff are to insert a blueberry size amount (approximately 1 gram) of cream on gloved fingertip 1 cm  inside patient vagina Monday and Friday night at bedtime. For long term use. Patient taking differently: Place 1 g vaginally 2 (two) times a week. Discard plastic applicator. LTC staff are to insert a blueberry size amount (approximately 1 gram) of cream on gloved fingertip 1 cm inside patient vagina Monday and Friday night at bedtime. For long term use. 07/10/23   Alphonsa Glendia LABOR, MD  levothyroxine  (SYNTHROID ) 75 MCG tablet TAKE (1) TABLET BY MOUTH EVERY MORNING. **TAKE ON AN EMPTY STOMACH AT LEAST 30 MINUTES FROM FOOD AND OTHER MEDS** Patient taking differently: Take 75 mcg by mouth daily before breakfast. 06/27/23   Alphonsa Glendia LABOR, MD  Melatonin 3 MG SUBL TAKE (1) TABLET BY MOUTH AT BEDTIME. Patient taking differently: Take 3 mg by mouth at bedtime. 08/11/23   Alphonsa Glendia LABOR, MD  Multiple Vitamins-Minerals (MULTIVITAMINS THER. W/MINERALS) TABS tablet Take 1 tablet by mouth daily. 11/13/23   Alphonsa Glendia LABOR, MD  omeprazole  (PRILOSEC) 40 MG capsule TAKE (1) CAPSULE BY MOUTH ONCE DAILY. 12/07/23   Alphonsa Glendia LABOR, MD  polyethylene glycol powder (GLYCOLAX /MIRALAX ) 17 GM/SCOOP powder Take 17 g by mouth daily.    [provider]  potassium chloride  (KLOR-CON  M) 10 MEQ tablet 1 on Monday morning and 1 on Friday morning Patient taking differently: Take 10 mEq by mouth 2 (two) times a week. 1 on Monday morning and  1 on Friday morning 07/17/23   Alphonsa Glendia LABOR, MD  potassium chloride  (KLOR-CON ) 10 MEQ tablet TAKE 1 TABLET BY MOUTH EACH MORNING ON MONDAY AND FRIDAY. 12/07/23   Luking, Glendia LABOR, MD  REFRESH 1.4-0.6 % SOLN INSTILL ONE DROP IN EACH EYE TWICE DAILY. Patient taking differently: Apply 1 drop to eye in the morning and at bedtime. 07/19/23   Alphonsa Glendia LABOR, MD  sertraline  (ZOLOFT ) 50 MG tablet 1 and 1/2 daily Patient taking differently: Take 75 mg by mouth daily. 08/15/23   Alphonsa Glendia LABOR, MD  torsemide  (DEMADEX ) 20 MG tablet TAKE (1) TABLET BY MOUTH EACH MORNING ON MONDAY AND FRIDAY. 12/07/23    Alphonsa Glendia LABOR, MD  trimethoprim  (TRIMPEX ) 100 MG tablet TAKE (1) TABLET BY MOUTH ONCE DAILY. 12/07/23   Alphonsa Glendia LABOR, MD    Allergies: Amiodarone , Cephalosporins, Meloxicam , Robaxin  [methocarbamol ], Amoxicillin, Lovenox [enoxaparin sodium], Morphine, Morphine and codeine, Penicillins, Sulfa antibiotics, Lipitor [atorvastatin], Lorazepam , Macrobid  [nitrofurantoin ], and Metoprolol     Review of Systems  Respiratory:  Negative for shortness of breath.   Cardiovascular:  Positive for chest pain.  Musculoskeletal:        Shoulder pain, hip pain  All other systems reviewed and are negative.   Updated Vital Signs BP (!) 165/61   Pulse 68   Temp 98 F (36.7 C)   Resp 18   Ht 1.575 m (5' 2)   Wt 59 kg   SpO2 96%   BMI 23.79 kg/m   Physical Exam Vitals and nursing [name] reviewed.  Constitutional:      Appearance: She is well-developed.     Comments: Elderly, chronically ill-appearing, no acute distress  HENT:     Head: Normocephalic and atraumatic.     Mouth/Throat:     Mouth: Mucous membranes are moist.  Eyes:     Pupils: Pupils are equal, round, and reactive to light.  Cardiovascular:     Rate and Rhythm: Normal rate and regular rhythm.     Heart sounds: Normal heart sounds.  Pulmonary:     Effort: Pulmonary effort is normal. No respiratory distress.     Breath sounds: No wheezing.     Comments: Right-sided chest wall tenderness to palpation, no overlying skin changes or crepitus Chest:     Chest wall: Tenderness present.  Abdominal:     Palpations: Abdomen is soft.  Musculoskeletal:     Cervical back: Neck supple.     Comments: Tenderness to palpation along the right clavicle, pain with range of motion of the right shoulder, no obvious deformities of the shoulder, shoulder appears located.  2+ radial pulse distally, neurovascularly intact Normal range of motion of the right hip, no obvious deformity or foreshortening, 2+ DP pulse  Skin:    General: Skin is warm and  dry.  Neurological:     Mental Status: She is alert and oriented to person, place, and time.  Psychiatric:        Mood and Affect: Mood normal.     (all labs ordered are listed, but only abnormal results are displayed) Labs Reviewed - No data to display  EKG: None  Radiology: DG Chest 1 View Result Date: 12/21/2023 CLINICAL DATA:  Pain after fall. EXAM: CHEST  1 VIEW COMPARISON:  08/24/2023 FINDINGS: Shallow inspiration. Heart size and pulmonary vascularity are normal. Lungs are clear. No pleural effusion or pneumothorax. Mediastinal contours appear intact. Calcification of the aorta. Degenerative changes in the shoulders. Old right rib fractures. Acute minimally displaced fracture  of the right clavicle. IMPRESSION: 1. No evidence of active pulmonary disease. 2. Fracture right clavicle. Electronically Signed   By: Elsie Gravely M.D.   On: 12/21/2023 23:24   DG Shoulder Right Result Date: 12/21/2023 CLINICAL DATA:  Pain after a fall EXAM: RIGHT SHOULDER - 2+ VIEW COMPARISON:  None Available. FINDINGS: Acute transverse fracture of the mid/distal shaft of the right clavicle with minimal displacement. No acute fractures demonstrated in the remainder of the shoulder. No glenohumeral dislocation. Acromioclavicular and coracoclavicular spaces are normal. Degenerative changes in the acromioclavicular and glenohumeral joints. Old appearing right rib fractures. Soft tissues are unremarkable. IMPRESSION: Transverse fracture of the right clavicle. Degenerative changes in the right shoulder. Electronically Signed   By: Elsie Gravely M.D.   On: 12/21/2023 23:23   DG Hip Unilat  With Pelvis 2-3 Views Right Result Date: 12/21/2023 CLINICAL DATA:  Unwitnessed fall.  Pain. EXAM: DG HIP (WITH OR WITHOUT PELVIS) 2-3V RIGHT COMPARISON:  CT abdomen pelvis 10/12/2023 FINDINGS: Degenerative changes in the lower lumbar spine and hips. Pelvis and right hip appear intact. No evidence of acute fracture or  dislocation. No focal bone lesion or bone destruction. Visualized sacrum appears intact. SI joints and symphysis pubis are not displaced. Vascular calcifications. IMPRESSION: Degenerative changes.  No acute bony abnormalities. Electronically Signed   By: Elsie Gravely M.D.   On: 12/21/2023 23:22    {Document cardiac monitor, telemetry assessment procedure when appropriate:32947} Procedures   Medications Ordered in the ED - No data to display    {Click here for ABCD2, HEART and other calculators REFRESH [name] before signing:1}                              Medical Decision Making Amount and/or Complexity of Data Reviewed Radiology: ordered.   ***  {Document critical care time when appropriate  Document review of labs and clinical decision tools ie [id number], etc  Document your independent review of radiology images and any outside records  Document your discussion with family members, caretakers and with consultants  Document social determinants of health affecting pt's care  Document your decision making why or why not admission, treatments were needed:32947:::1}   Final diagnoses:  None    ED Discharge Orders     None

## 2023-12-21 NOTE — ED Triage Notes (Signed)
 [facility] from home. Cc of unwitnessed fall.  C/o right shoulder, right ribs, right hip pain. Also states later that she hit the back of her head.  Refused c collar  Was able to stand to the EMS stretcher.  Able to answer orientation questions but forgetful.  Call granddaughter [facility]) for any questions.

## 2023-12-21 NOTE — ED Notes (Signed)
 Patient transported to X-ray

## 2023-12-22 ENCOUNTER — Other Ambulatory Visit: Payer: Self-pay | Admitting: Family Medicine

## 2023-12-22 ENCOUNTER — Telehealth: Payer: Self-pay | Admitting: Family Medicine

## 2023-12-22 ENCOUNTER — Encounter: Payer: Self-pay | Admitting: Family Medicine

## 2023-12-22 DIAGNOSIS — G9389 Other specified disorders of brain: Secondary | ICD-10-CM | POA: Diagnosis not present

## 2023-12-22 DIAGNOSIS — S0990XA Unspecified injury of head, initial encounter: Secondary | ICD-10-CM | POA: Diagnosis not present

## 2023-12-22 MED ORDER — HYDROCODONE-ACETAMINOPHEN 5-325 MG PO TABS
1.0000 | ORAL_TABLET | Freq: Once | ORAL | Status: AC
  Administered 2023-12-22: 1 via ORAL
  Filled 2023-12-22: qty 1

## 2023-12-22 MED ORDER — SERTRALINE HCL 50 MG PO TABS: ORAL_TABLET | ORAL | 5 refills | Status: DC

## 2023-12-22 NOTE — Discharge Instructions (Signed)
 You were seen today after a fall.  You broke your clavicle.  Keep splint in place for comfort.  Take Tylenol  for pain.  Follow-up with orthopedics.

## 2023-12-22 NOTE — ED Notes (Signed)
 Received callback from Charmaine All, med-tech at [facility] who stated that her director stated that the pt could return. Pt placed on [facility] list and awaiting transport back to the facility.

## 2023-12-22 NOTE — ED Notes (Signed)
 Called [facility] to give report on pt. Was told by Earle All, med-tech that she would need to contact her director to see if they could take pt back with a fx clavicle because they are an assisted living facility versus having pt go to a skilled rehab facility. States she would page her director and that she would call back once she heard from her. Stated Interior and spatial designer usually wakes around 5am.

## 2023-12-22 NOTE — ED Notes (Signed)
 Patient placed back on [facility] list back to [facility] @ this time

## 2023-12-22 NOTE — Telephone Encounter (Signed)
 Nurses I am recommending that we reduce her sertraline  down to 50 mg once daily Please notify [facility] Please write out the order and I will sign it I will also send notification to [facility] We have sent a MyChart message to the family-Maranda will give us  feedback next week whether she wants to continue the current dosing at 50 mg or taper off of it

## 2023-12-22 NOTE — ED Notes (Signed)
 Attempted to reach back out to [facility] (x 2) to see whether they had heard anything from their director regarding accepting pt back to their facility. Calls went straight to voicemail.

## 2023-12-25 ENCOUNTER — Other Ambulatory Visit: Payer: Self-pay

## 2023-12-26 ENCOUNTER — Telehealth: Payer: Self-pay | Admitting: Family Medicine

## 2023-12-26 NOTE — Telephone Encounter (Signed)
 I communicated with family Patient having hallucinations They are concerned it could be due to sertraline  My concern is that is that it may well be a age-related issue  Nurses #1 please call [facility]-speak with medical coordinator/medical assistant for this patient #2-explained to them that we would like to taper off of her sertraline  She was on 75 mg a day-which is a 50 mg tablet 1-1/2 tablet daily We are recommending to go down to 50 mg-1/day for 7 days Then go down to half tablet-25 mg-daily for 7 days Then stop  I wrote out a prescription that has this written out as an order This can be faxed to them And can also be [facility]ed to [facility] she [facility] prescriptions  If any questions problems or issues please let me know  Thanks-Dr. Glendia

## 2023-12-26 NOTE — Telephone Encounter (Signed)
 Faxed to [facility] and [facility].  Spoke to staff at [employer] they received script and we went over directions.

## 2023-12-28 ENCOUNTER — Ambulatory Visit (INDEPENDENT_AMBULATORY_CARE_PROVIDER_SITE_OTHER): Admitting: Family Medicine

## 2023-12-28 DIAGNOSIS — S42024D Nondisplaced fracture of shaft of right clavicle, subsequent encounter for fracture with routine healing: Secondary | ICD-10-CM

## 2023-12-28 DIAGNOSIS — F5101 Primary insomnia: Secondary | ICD-10-CM | POA: Diagnosis not present

## 2023-12-28 DIAGNOSIS — R443 Hallucinations, unspecified: Secondary | ICD-10-CM

## 2023-12-28 MED ORDER — HYDROCODONE-ACETAMINOPHEN 5-325 MG PO TABS: ORAL_TABLET | ORAL | 0 refills | Status: DC

## 2023-12-28 MED ORDER — ALPRAZOLAM 0.25 MG PO TABS: ORAL_TABLET | ORAL | 5 refills | Status: DC

## 2023-12-28 NOTE — Progress Notes (Signed)
 Rest home visit She is in assisted living She recently fractured her right clavicle Having a lot of pain and discomfort Tylenol  not helping enough She is laid up into her bed or in her chair unable to do much in the way of walking and she has a right arm in a sling  She also suffers with anxiety with occasional panic attacks she does not like being in assisted living but she understands she cannot be on her own anymore Her family does come in to visit her on a regular basis Patient also relates having hallucinations at nighttime-she feels they are very real she describes young lady's with young children and dogs coming into her room every night From talking with the granddaughter as well as talking with staff there is no one like this visiting the facility  Patient is oriented is aware of who I am aware she is and aware of the time a year which is pretty good for 89 years old  Recently we initiated the taper of alprazolam  because of her fall out of bed We also stayed away from the hydrocodone  but it appears it is going to have to be reinitiated because of the pain she is having  Her lungs are clear hearts regular pulse normal X-ray from the ER was reviewed  1. Closed nondisplaced fracture of shaft of right clavicle with routine healing, subsequent encounter (Primary) Patient overall doing well continue to wear the sling Initiate half of a hydrocodone  every 4 hours as needed for pain caution drowsiness Hopefully she can tolerate this for pain because Tylenol  alone is not doing enough  2. Hallucinations We are tapering off of sertraline  just in case this could be triggering her hallucinations but that is very unlikely We are also tapering off of her alprazolam  to make sure that that is not a factor plus also to avoid falls at night  3. Primary insomnia Melatonin can be used otherwise avoid medications  As for the hallucinations atypical antipsychotics could be used if she starts  becoming aggressive with these hallucinations.

## 2023-12-29 ENCOUNTER — Telehealth: Payer: Self-pay | Admitting: Family Medicine

## 2023-12-29 ENCOUNTER — Other Ambulatory Visit: Payer: Self-pay

## 2023-12-29 ENCOUNTER — Encounter: Payer: Self-pay | Admitting: Family Medicine

## 2023-12-29 DIAGNOSIS — S42024D Nondisplaced fracture of shaft of right clavicle, subsequent encounter for fracture with routine healing: Secondary | ICD-10-CM

## 2023-12-29 NOTE — Telephone Encounter (Signed)
 Referral placed.

## 2023-12-29 NOTE — Telephone Encounter (Signed)
 Nurses Please put in consultation with orthopedics here in [place] with Dr. Margrette or Dr.Cairns due to right clavicle fracture She is a patient at [facility] but they would be able to bring her to her appointment  She is already had x-rays at [facility] thank you

## 2024-01-04 ENCOUNTER — Telehealth: Payer: Self-pay | Admitting: Family Medicine

## 2024-01-04 ENCOUNTER — Other Ambulatory Visit (INDEPENDENT_AMBULATORY_CARE_PROVIDER_SITE_OTHER)

## 2024-01-04 ENCOUNTER — Other Ambulatory Visit (INDEPENDENT_AMBULATORY_CARE_PROVIDER_SITE_OTHER): Admitting: Nurse Practitioner

## 2024-01-04 ENCOUNTER — Ambulatory Visit: Payer: Self-pay | Admitting: Nurse Practitioner

## 2024-01-04 DIAGNOSIS — N390 Urinary tract infection, site not specified: Secondary | ICD-10-CM

## 2024-01-04 LAB — POCT UA - MICROSCOPIC ONLY

## 2024-01-04 LAB — POCT URINALYSIS DIP (CLINITEK)
Bilirubin, UA: NEGATIVE
Glucose, UA: NEGATIVE mg/dL
Ketones, POC UA: NEGATIVE mg/dL
Leukocytes, UA: NEGATIVE
Nitrite, UA: NEGATIVE
POC PROTEIN,UA: NEGATIVE
Spec Grav, UA: 1.02 (ref 1.010–1.025)
Urobilinogen, UA: 0.2 U/dL
pH, UA: 6 (ref 5.0–8.0)

## 2024-01-04 NOTE — Telephone Encounter (Signed)
 Nurses Patient is at [facility] which is an assisted living facility here in [place] She is having urinary symptoms of frequency she has a history of frequent UTIs,  Family will pick up specimen cups (may have 2) with wipes  Family will collect a urine specimen and bring that back over to us  I request that we do a UA dipstick and spin then notify Elveria to take a look at it please send in the urine for urine culture as well  Thanks-Liyla Radliff

## 2024-01-07 LAB — SPECIMEN STATUS REPORT

## 2024-01-07 LAB — URINE CULTURE

## 2024-01-08 ENCOUNTER — Other Ambulatory Visit: Payer: Self-pay | Admitting: Nurse Practitioner

## 2024-01-08 MED ORDER — CIPROFLOXACIN HCL 500 MG PO TABS
500.0000 mg | ORAL_TABLET | Freq: Two times a day (BID) | ORAL | 0 refills | Status: DC
Start: 2024-01-08 — End: 2024-02-14

## 2024-01-09 ENCOUNTER — Other Ambulatory Visit: Payer: Self-pay | Admitting: Family Medicine

## 2024-01-10 ENCOUNTER — Encounter: Payer: Self-pay | Admitting: Orthopedic Surgery

## 2024-01-10 ENCOUNTER — Ambulatory Visit: Admitting: Orthopedic Surgery

## 2024-01-10 VITALS — BP 137/68 | HR 71 | Ht 62.0 in | Wt 130.0 lb

## 2024-01-10 DIAGNOSIS — S42024A Nondisplaced fracture of shaft of right clavicle, initial encounter for closed fracture: Secondary | ICD-10-CM | POA: Diagnosis not present

## 2024-01-10 NOTE — Progress Notes (Signed)
  Intake history:  Ht 5' 2 (1.575 m)   Wt 130 lb (59 kg)   BMI 23.78 kg/m  Body mass index is 23.78 kg/m.    WHAT ARE WE SEEING YOU FOR TODAY?   right shoulder  How long has this bothered you? (DOI?DOS?WS?)  on 12/22/23  Was there an injury? Yes  Anticoag.  No  Diabetes No  Heart disease Yes  Hypertension No  SMOKING HX No  Kidney disease Yes / /    Latest Ref Rng & Units 10/12/2023    6:05 PM 08/24/2023    9:21 AM 07/21/2023    2:32 PM  CMP  Glucose 70 - 99 mg/dL 890  895  889   BUN 8 - 23 mg/dL 27  20  15    Creatinine 0.44 - 1.00 mg/dL 8.66  8.80  8.80   Sodium 135 - 145 mmol/L 137  139  145   Potassium 3.5 - 5.1 mmol/L 4.4  4.6  4.3   Chloride 98 - 111 mmol/L 109  105  108   CO2 22 - 32 mmol/L 21  26  17    Calcium  8.9 - 10.3 mg/dL 9.0  9.3  8.7   Total Protein 6.5 - 8.1 g/dL 6.7     Total Bilirubin 0.0 - 1.2 mg/dL 0.5     Alkaline Phos 38 - 126 U/L 81     AST 15 - 41 U/L 14     ALT 0 - 44 U/L 12       Allergies  Allergen Reactions   Amiodarone  Itching   Cephalosporins Itching   Meloxicam  Itching   Robaxin  [Methocarbamol ] Itching   Amoxicillin Hives   Lovenox [Enoxaparin Sodium] Nausea And Vomiting   Morphine Other (See Comments)    Unknown Other reaction(s): Not available   Morphine And Codeine Itching   Penicillins Swelling        Sulfa Antibiotics Swelling   Lipitor [Atorvastatin] Other (See Comments)    Unknown:patient is not aware of this allergy   Lorazepam  Other (See Comments)    Exceptional fatigue    Macrobid  [Nitrofurantoin ] Other (See Comments)    Constipation   Metoprolol      hallucinations   ______________________________________   Treatment:  Have you taken:  Tylenol  No  Advil  No  Had PT No  Had injection No  Other  ________________Sling_________

## 2024-01-10 NOTE — Progress Notes (Signed)
 Chief Complaint  Patient presents with   Clavicle Injury    12/22/23     89 year old female fell when getting out of bed to get something off the floor injured her right shoulder  Complains of minimal discomfort no skin lesion  Exam shows that she is very conversive awake alert oriented mood and affect normal.  She wheeled in in a wheelchair but I believe that she stands and walks  Evaluation of the right shoulder she had tenderness at the fracture site no palpable step-off decreased range of motion of the shoulder  Stability deferred  Good grip strength normal skin as stated good pulse and perfusion of the right upper extremity and normal sensation  Outside images included a chest x-ray which showed a clavicle fracture mid shaft no displacement or angulation  Encounter Diagnosis  Name Primary?   Closed nondisplaced fracture of shaft of right clavicle, initial encounter Yes   CPT 23500   Plan x-ray in 6 weeks  Sling for comfort May remove if not having any pain Intake history:  BP 137/68   Pulse 71   Ht 5' 2 (1.575 m)   Wt 130 lb (59 kg)   BMI 23.78 kg/m  Body mass index is 23.78 kg/m.    WHAT ARE WE SEEING YOU FOR TODAY?   right shoulder  How long has this bothered you? (DOI?DOS?WS?)  on 12/22/23  Was there an injury? Yes  Anticoag.  No  Diabetes No  Heart disease Yes  Hypertension No  SMOKING HX No  Kidney disease Yes / /    Latest Ref Rng & Units 10/12/2023    6:05 PM 08/24/2023    9:21 AM 07/21/2023    2:32 PM  CMP  Glucose 70 - 99 mg/dL 890  895  889   BUN 8 - 23 mg/dL 27  20  15    Creatinine 0.44 - 1.00 mg/dL 8.66  8.80  8.80   Sodium 135 - 145 mmol/L 137  139  145   Potassium 3.5 - 5.1 mmol/L 4.4  4.6  4.3   Chloride 98 - 111 mmol/L 109  105  108   CO2 22 - 32 mmol/L 21  26  17    Calcium  8.9 - 10.3 mg/dL 9.0  9.3  8.7   Total Protein 6.5 - 8.1 g/dL 6.7     Total Bilirubin 0.0 - 1.2 mg/dL 0.5     Alkaline Phos 38 - 126 U/L 81     AST 15  - 41 U/L 14     ALT 0 - 44 U/L 12       Allergies  Allergen Reactions   Amiodarone  Itching   Cephalosporins Itching   Meloxicam  Itching   Robaxin  [Methocarbamol ] Itching   Amoxicillin Hives   Lovenox [Enoxaparin Sodium] Nausea And Vomiting   Morphine Other (See Comments)    Unknown Other reaction(s): Not available   Morphine And Codeine Itching   Penicillins Swelling        Sulfa Antibiotics Swelling   Lipitor [Atorvastatin] Other (See Comments)    Unknown:patient is not aware of this allergy   Lorazepam  Other (See Comments)    Exceptional fatigue    Macrobid  [Nitrofurantoin ] Other (See Comments)    Constipation   Metoprolol      hallucinations   ______________________________________   Treatment:  Have you taken:  Tylenol  No  Advil  No  Had PT No  Had injection No  Other  ________________Sling_________

## 2024-01-18 ENCOUNTER — Other Ambulatory Visit: Payer: Self-pay | Admitting: Family Medicine

## 2024-01-18 ENCOUNTER — Telehealth: Payer: Self-pay | Admitting: Family Medicine

## 2024-01-18 MED ORDER — SERTRALINE HCL 50 MG PO TABS: 50.0000 mg | ORAL_TABLET | Freq: Every day | ORAL | 3 refills | Status: DC

## 2024-01-18 NOTE — Telephone Encounter (Signed)
 Nurses Please reach out to [facility] by phone  I sent in sertraline  50 mg 1 each evening [facility] her prescriptions  Please have Elveria sign off on order to resume sertraline  50 mg nightly and send this [facility] stays at  As for alprazolam -it can be utilize when necessary for panic attacks but we cannot use it for sleep at night because of the risk of falling-it is just not standard of care to use alprazolam  at bedtime for elderly individuals because of the risk of falls.  Therefore I cannot go down that path.  Please also explained to [facility] that Thursday Friday of this week I am out the office-I feel that by responding via this way we are initiating the medication and addressing her concerns but I can do a phone call next week I could call her at the lunch hour perhaps Tuesday morning or at the lunch hour on Wednesday Thursday or Friday  Thanks-Dr. Glendia  If there are additional concerns please touch base with one of the clinicians covering for me while I am gone

## 2024-01-18 NOTE — Telephone Encounter (Signed)
 Nurses I sent a detailed message via phone message Please call [name]  I am scheduled out of the office from today through Labor Day.  I am unable to do phone calls currently  I read through her message I recommend restarting sertraline  50 mg each evening to begin with we can always increase the dose after a couple weeks at this dose As for alprazolam  and can utilize during panic attacks but cannot be used at bedtime please see telephone message for full details  Also my telephone message also indicated when I would be available next week to do a phone conversation  Nurses-please send me telephone message or staff message regarding anything I need to be doing next week in regards to the phone call thank you

## 2024-01-19 ENCOUNTER — Ambulatory Visit: Admitting: Orthopedic Surgery

## 2024-01-19 NOTE — Telephone Encounter (Signed)
 Sent order for Sertraline .

## 2024-01-19 NOTE — Telephone Encounter (Signed)
 Have spoken with granddaughter Cena and informed per provider notifications. Faxed order notes over to assisted living facility as noted.

## 2024-01-23 ENCOUNTER — Emergency Department (HOSPITAL_COMMUNITY): Admission: EM | Admit: 2024-01-23 | Discharge: 2024-01-24 | Disposition: A | Discharge status: Home / Self Care

## 2024-01-23 ENCOUNTER — Encounter (HOSPITAL_COMMUNITY): Payer: Self-pay | Admitting: Emergency Medicine

## 2024-01-23 ENCOUNTER — Other Ambulatory Visit: Payer: Self-pay

## 2024-01-23 ENCOUNTER — Emergency Department (HOSPITAL_COMMUNITY)

## 2024-01-23 DIAGNOSIS — I10 Essential (primary) hypertension: Secondary | ICD-10-CM | POA: Diagnosis not present

## 2024-01-23 DIAGNOSIS — K5750 Diverticulosis of both small and large intestine without perforation or abscess without bleeding: Secondary | ICD-10-CM | POA: Diagnosis not present

## 2024-01-23 DIAGNOSIS — R1084 Generalized abdominal pain: Secondary | ICD-10-CM | POA: Diagnosis not present

## 2024-01-23 DIAGNOSIS — S2242XA Multiple fractures of ribs, left side, initial encounter for closed fracture: Secondary | ICD-10-CM | POA: Diagnosis not present

## 2024-01-23 DIAGNOSIS — M8589 Other specified disorders of bone density and structure, multiple sites: Secondary | ICD-10-CM | POA: Diagnosis not present

## 2024-01-23 DIAGNOSIS — Z79899 Other long term (current) drug therapy: Secondary | ICD-10-CM | POA: Diagnosis not present

## 2024-01-23 DIAGNOSIS — I700 Atherosclerosis of aorta: Secondary | ICD-10-CM | POA: Insufficient documentation

## 2024-01-23 DIAGNOSIS — J479 Bronchiectasis, uncomplicated: Secondary | ICD-10-CM | POA: Diagnosis not present

## 2024-01-23 DIAGNOSIS — S299XXA Unspecified injury of thorax, initial encounter: Secondary | ICD-10-CM | POA: Diagnosis not present

## 2024-01-23 DIAGNOSIS — R0781 Pleurodynia: Secondary | ICD-10-CM | POA: Diagnosis not present

## 2024-01-23 DIAGNOSIS — X509XXA Other and unspecified overexertion or strenuous movements or postures, initial encounter: Secondary | ICD-10-CM | POA: Insufficient documentation

## 2024-01-23 LAB — COMPREHENSIVE METABOLIC PANEL WITH GFR
ALT: 14 U/L (ref 0–44)
AST: 18 U/L (ref 15–41)
Albumin: 3.8 g/dL (ref 3.5–5.0)
Alkaline Phosphatase: 83 U/L (ref 38–126)
Anion gap: 8 (ref 5–15)
BUN: 28 mg/dL — ABNORMAL HIGH (ref 8–23)
CO2: 22 mmol/L (ref 22–32)
Calcium: 8.7 mg/dL — ABNORMAL LOW (ref 8.9–10.3)
Chloride: 111 mmol/L (ref 98–111)
Creatinine, Ser: 1.11 mg/dL — ABNORMAL HIGH (ref 0.44–1.00)
GFR, Estimated: 45 mL/min — ABNORMAL LOW (ref >60)
Glucose, Bld: 114 mg/dL — ABNORMAL HIGH (ref 70–99)
Potassium: 4.1 mmol/L (ref 3.5–5.1)
Sodium: 141 mmol/L (ref 135–145)
Total Bilirubin: 0.5 mg/dL (ref 0.0–1.2)
Total Protein: 6.6 g/dL (ref 6.5–8.1)

## 2024-01-23 LAB — CBC WITH DIFFERENTIAL/PLATELET
Abs Immature Granulocytes: 0.06 K/uL (ref 0.00–0.07)
Basophils Absolute: 0 K/uL (ref 0.0–0.1)
Basophils Relative: 0 %
Eosinophils Absolute: 0.2 K/uL (ref 0.0–0.5)
Eosinophils Relative: 2 %
HCT: 33 % — ABNORMAL LOW (ref 36.0–46.0)
Hemoglobin: 10.5 g/dL — ABNORMAL LOW (ref 12.0–15.0)
Immature Granulocytes: 1 %
Lymphocytes Relative: 14 %
Lymphs Abs: 1.4 K/uL (ref 0.7–4.0)
MCH: 29.6 pg (ref 26.0–34.0)
MCHC: 31.8 g/dL (ref 30.0–36.0)
MCV: 93 fL (ref 80.0–100.0)
Monocytes Absolute: 0.8 K/uL (ref 0.1–1.0)
Monocytes Relative: 8 %
Neutro Abs: 7.6 K/uL (ref 1.7–7.7)
Neutrophils Relative %: 75 %
Platelets: 248 K/uL (ref 150–400)
RBC: 3.55 MIL/uL — ABNORMAL LOW (ref 3.87–5.11)
RDW: 13.3 % (ref 11.5–15.5)
WBC: 10 K/uL (ref 4.0–10.5)
nRBC: 0 % (ref 0.0–0.2)

## 2024-01-23 LAB — URINALYSIS, ROUTINE W REFLEX MICROSCOPIC
Bilirubin Urine: NEGATIVE
Glucose, UA: NEGATIVE mg/dL
Hgb urine dipstick: NEGATIVE
Ketones, ur: NEGATIVE mg/dL
Leukocytes,Ua: NEGATIVE
Nitrite: NEGATIVE
Protein, ur: NEGATIVE mg/dL
Specific Gravity, Urine: 1.014 (ref 1.005–1.030)
pH: 5 (ref 5.0–8.0)

## 2024-01-23 LAB — LIPASE, BLOOD: Lipase: 39 U/L (ref 11–51)

## 2024-01-23 MED ORDER — OXYCODONE HCL 5 MG PO TABS: 2.5000 mg | ORAL_TABLET | ORAL | 0 refills | Status: DC | PRN

## 2024-01-23 MED ORDER — IOHEXOL 300 MG/ML  SOLN
100.0000 mL | Freq: Once | INTRAMUSCULAR | Status: AC | PRN
  Administered 2024-01-23: 100 mL via INTRAVENOUS

## 2024-01-23 MED ORDER — OXYCODONE HCL 5 MG PO TABS
2.5000 mg | ORAL_TABLET | Freq: Once | ORAL | Status: AC
  Administered 2024-01-23: 2.5 mg via ORAL
  Filled 2024-01-23: qty 1

## 2024-01-23 MED ORDER — LIDOCAINE 5 % EX PTCH
1.0000 | MEDICATED_PATCH | CUTANEOUS | Status: DC
  Administered 2024-01-23: 1 via TRANSDERMAL
  Filled 2024-01-23: qty 1

## 2024-01-23 MED ORDER — SERTRALINE HCL 50 MG PO TABS
50.0000 mg | ORAL_TABLET | Freq: Once | ORAL | Status: AC
  Administered 2024-01-23: 50 mg via ORAL
  Filled 2024-01-23: qty 1

## 2024-01-23 MED ORDER — LIDOCAINE 5 % EX PTCH: 1.0000 | MEDICATED_PATCH | CUTANEOUS | 0 refills | Status: DC

## 2024-01-23 NOTE — Discharge Instructions (Addendum)
 For pain, you can take 1000 mg of Tylenol  or 1 g of Tylenol  every 6-8 hours.  Do not exceed more than 4000 mg or 4 g in a 24-hour period.   Please use the lidocaine  patch as well to address pain.   Please 2.5 mg of oxycodone  as needed for pain.    Please make sure to expand your lungs multiple times per day with incentive spirometer.  We need to prevent any kind of pneumonia.  If anything changes, please come back to the ED for further evaluation.

## 2024-01-23 NOTE — ED Notes (Signed)
 Family updated on discharge

## 2024-01-23 NOTE — ED Notes (Signed)
 2 attempts to establish a PIV.

## 2024-01-23 NOTE — ED Notes (Signed)
 This nurse attempted to insert a peripheral IV and obtain labs. Pt said that she did not think this nurse would be able to get it. This nurse made attempt, access was difficult. Pt stated she was done did not want this nurse to try anymore. She stated I just want a pill for the pain and to go home. This nurse retracted the needle and applied lidocaine  patch at ordered by MD. Access still not made will make MD aware.

## 2024-01-23 NOTE — ED Notes (Signed)
 Patient's Grandaughter, Cena is the emergency contact and needs to be consulted for all medical decisions and prior to discharge.

## 2024-01-23 NOTE — ED Notes (Signed)
 Called for tx back to [facility]. [name]

## 2024-01-23 NOTE — ED Triage Notes (Addendum)
 Pt to the ED with [facility] from [facility] with complaints of left rib pain.  Pt states she was pulling a door open last night when she heard a pop and the pain started.  Pt has a broken right collar bone and has only had tylenol  for the last week. Pt's grandaughter request pain meds for her via phone.  Please consult granddaughter- Cena info in the chart for all medical decisions.  Pt has dementia at baseline per EMS

## 2024-01-23 NOTE — ED Provider Notes (Signed)
 [place] EMERGENCY DEPARTMENT AT [facility] Provider Note   CSN: [id number] Arrival date & time: 01/23/24  1821     Patient presents with: Rib Injury   [name] is a 89 y.o. female.   HPI      Presents because of left rib pain.  Patient states it started about 1 to 2 days ago.  Happened whenever she was trying to the door closed.  Since then, has been having worsening pain on this left rib side.  No falls that she is aware of.  No chest pain.  No nausea vomit diarrhea.  No abdominal pain.  Otherwise, patient stating that she is in her normal health.  Per Granddaughter:  Patient broke a couple ribs bacnk in 2018. Reinjured them every now and then. Has been getting some hydrocodone  PRN. Unclar bowel habits leading up to this.   Prior to Admission medications   Medication Sig Start Date End Date Taking? Authorizing Provider  lidocaine  (LIDODERM ) 5 % Place 1 patch onto the skin daily. Remove & Discard patch within 12 hours or as directed by MD 01/23/24  Yes Simon Lavonia SAILOR, MD  oxyCODONE  (ROXICODONE ) 5 MG immediate release tablet Take 0.5 tablets (2.5 mg total) by mouth every 4 (four) hours as needed for up to 20 doses for severe pain (pain score 7-10). 01/23/24  Yes Simon Lavonia SAILOR, MD  acetaminophen  (TYLENOL ) 500 MG tablet Take 1 tablet (500 mg total) by mouth every 6 (six) hours as needed. Patient taking differently: Take 500 mg by mouth in the morning and at bedtime. *May take 1 tablet every 6 hours as needed for pain 08/03/23   Alphonsa Glendia LABOR, MD  allopurinol  (ZYLOPRIM ) 100 MG tablet TAKE 1/2 TABLET(50MG ) BY MOUTH ONCE DAILY. Patient taking differently: Take 50 mg by mouth daily. 09/12/23   Alphonsa Glendia LABOR, MD  ALPRAZolam  (XANAX ) 0.25 MG tablet 1 tablet as necessary for panic attack 12/19/23   Alphonsa Glendia LABOR, MD  ALPRAZolam  (XANAX ) 0.25 MG tablet 12 nightly for insomnia for 21 days then stop(hold medication if patient asleep) 12/28/23   Luking, Glendia LABOR, MD  ciprofloxacin   (CIPRO ) 500 MG tablet Take 1 tablet (500 mg total) by mouth 2 (two) times daily. 01/08/24   Mauro Elveria BROCKS, NP  diltiazem  (CARDIZEM  CD) 120 MG 24 hr capsule Take 120 mg by mouth daily.    [provider]  diltiazem  (CARDIZEM  CD) 180 MG 24 hr capsule TAKE (1) CAPSULE BY MOUTH ONCE DAILY. 12/07/23   Alphonsa Glendia LABOR, MD  estradiol  (ESTRACE ) 0.1 MG/GM vaginal cream Discard plastic applicator. LTC staff are to insert a blueberry size amount (approximately 1 gram) of cream on gloved fingertip 1 cm inside patient vagina Monday and Friday night at bedtime. For long term use. Patient taking differently: Place 1 g vaginally 2 (two) times a week. Discard plastic applicator. LTC staff are to insert a blueberry size amount (approximately 1 gram) of cream on gloved fingertip 1 cm inside patient vagina Monday and Friday night at bedtime. For long term use. 07/10/23   Alphonsa Glendia LABOR, MD  HYDROcodone -acetaminophen  (NORCO/VICODIN) 5-325 MG tablet 1/2 q 4 prn pain fractured clavicle 12/28/23   Alphonsa Glendia LABOR, MD  levothyroxine  (SYNTHROID ) 75 MCG tablet TAKE (1) TABLET BY MOUTH EVERY MORNING. **TAKE ON AN EMPTY STOMACH AT LEAST 30 MINUTES FROM FOOD AND OTHER MEDS** Patient taking differently: Take 75 mcg by mouth daily before breakfast. 06/27/23   Alphonsa Glendia LABOR, MD  Melatonin 3  MG SUBL TAKE (1) TABLET BY MOUTH AT BEDTIME. Patient taking differently: Take 3 mg by mouth at bedtime. 08/11/23   Alphonsa Glendia LABOR, MD  Multiple Vitamins-Minerals (MULTIVITAMINS THER. W/MINERALS) TABS tablet Take 1 tablet by mouth daily. 11/13/23   Alphonsa Glendia LABOR, MD  omeprazole  (PRILOSEC) 40 MG capsule TAKE (1) CAPSULE BY MOUTH ONCE DAILY. 12/07/23   Alphonsa Glendia LABOR, MD  polyethylene glycol powder (GLYCOLAX /MIRALAX ) 17 GM/SCOOP powder Take 17 g by mouth daily.    [provider]  potassium chloride  (KLOR-CON  M) 10 MEQ tablet 1 on Monday morning and 1 on Friday morning Patient taking differently: Take 10 mEq by mouth 2 (two) times  a week. 1 on Monday morning and 1 on Friday morning 07/17/23   Alphonsa Glendia LABOR, MD  potassium chloride  (KLOR-CON ) 10 MEQ tablet TAKE 1 TABLET BY MOUTH EACH MORNING ON MONDAY AND FRIDAY. 12/07/23   Luking, Glendia LABOR, MD  REFRESH 1.4-0.6 % SOLN INSTILL ONE DROP IN EACH EYE TWICE DAILY. Patient taking differently: Apply 1 drop to eye in the morning and at bedtime. 07/19/23   Alphonsa Glendia LABOR, MD  sertraline  (ZOLOFT ) 50 MG tablet Take 1 tablet (50 mg total) by mouth daily. 01/18/24   Alphonsa Glendia LABOR, MD  torsemide  (DEMADEX ) 20 MG tablet TAKE (1) TABLET BY MOUTH EACH MORNING ON MONDAY AND FRIDAY. 12/07/23   Alphonsa Glendia LABOR, MD  trimethoprim  (TRIMPEX ) 100 MG tablet TAKE (1) TABLET BY MOUTH ONCE DAILY. 12/07/23   Alphonsa Glendia LABOR, MD    Allergies: Amiodarone , Cephalosporins, Meloxicam , Robaxin  [methocarbamol ], Amoxicillin, Lovenox [enoxaparin sodium], Morphine, Morphine and codeine, Penicillins, Sulfa antibiotics, Lipitor [atorvastatin], Lorazepam , Macrobid  [nitrofurantoin ], and Metoprolol     Review of Systems  Constitutional:  Negative for chills and fever.  HENT:  Negative for ear pain and sore throat.   Eyes:  Negative for pain and visual disturbance.  Respiratory:  Negative for cough and shortness of breath.   Cardiovascular:  Negative for chest pain and palpitations.  Gastrointestinal:  Negative for abdominal pain and vomiting.  Genitourinary:  Negative for dysuria and hematuria.  Musculoskeletal:  Negative for arthralgias and back pain.  Skin:  Negative for color change and rash.  Neurological:  Negative for seizures and syncope.  All other systems reviewed and are negative.   Updated Vital Signs BP (!) 144/69   Pulse 69   Temp 98 F (36.7 C) (Tympanic)   Resp 17   Ht 5' 2 (1.575 m)   Wt 59 kg   SpO2 91%   BMI 23.78 kg/m   Physical Exam Vitals and nursing note reviewed.  Constitutional:      General: She is not in acute distress.    Appearance: She is well-developed.  HENT:     Head:  Normocephalic and atraumatic.  Eyes:     Conjunctiva/sclera: Conjunctivae normal.  Cardiovascular:     Rate and Rhythm: Normal rate and regular rhythm.     Heart sounds: No murmur heard. Pulmonary:     Effort: Pulmonary effort is normal. No respiratory distress.     Breath sounds: Normal breath sounds.  Abdominal:     Palpations: Abdomen is soft.     Tenderness: There is no abdominal tenderness.  Musculoskeletal:        General: No swelling.       Arms:     Cervical back: Neck supple.  Skin:    General: Skin is warm and dry.     Capillary Refill: Capillary refill takes less than  2 seconds.  Neurological:     Mental Status: She is alert.  Psychiatric:        Mood and Affect: Mood normal.     (all labs ordered are listed, but only abnormal results are displayed) Labs Reviewed  CBC WITH DIFFERENTIAL/PLATELET - Abnormal; Notable for the following components:      Result Value   RBC 3.55 (*)    Hemoglobin 10.5 (*)    HCT 33.0 (*)    All other components within normal limits  COMPREHENSIVE METABOLIC PANEL WITH GFR - Abnormal; Notable for the following components:   Glucose, Bld 114 (*)    BUN 28 (*)    Creatinine, Ser 1.11 (*)    Calcium  8.7 (*)    GFR, Estimated 45 (*)    All other components within normal limits  LIPASE, BLOOD  URINALYSIS, ROUTINE W REFLEX MICROSCOPIC    EKG: None  Radiology: CT CHEST ABDOMEN PELVIS W CONTRAST Result Date: 01/23/2024 CLINICAL DATA:  Left rib pain.  Evaluate for fracture. EXAM: CT CHEST, ABDOMEN, AND PELVIS WITH CONTRAST TECHNIQUE: Multidetector CT imaging of the chest, abdomen and pelvis was performed following the standard protocol during bolus administration of intravenous contrast. RADIATION DOSE REDUCTION: This exam was performed according to the departmental dose-optimization program which includes automated exposure control, adjustment of the mA and/or kV according to patient size and/or use of iterative reconstruction technique.  CONTRAST:  OMNIPAQUE  IOHEXOL  300 MG/ML  SOLN COMPARISON:  Chest radiograph dated 01/23/2024. CT abdomen pelvis dated 10/12/2023. FINDINGS: CT CHEST FINDINGS Cardiovascular: There is no cardiomegaly or pericardial effusion. There is coronary vascular calcification. Advanced atherosclerotic calcification of the thoracic aorta. No aneurysmal dilatation or dissection. The origins of the great vessels of the aortic arch and the central pulmonary arteries appear patent. Mediastinum/Nodes: No hilar or mediastinal adenopathy. The esophagus is grossly unremarkable. No mediastinal fluid collection. Lungs/Pleura: Bibasilar linear and streaky atelectasis/scarring. Mild bilateral lower lobe bronchiectasis. No pleural effusion or pneumothorax. The central airways are patent. Musculoskeletal: Mildly displaced acute fractures of the lateral left fifth and sixth ribs. Several subacute or old right rib fractures. CT ABDOMEN PELVIS FINDINGS No intra-abdominal free air or free fluid. Hepatobiliary: The liver is unremarkable. No biliary ductal dilatation. The gallbladder is unremarkable. Pancreas: Unremarkable. No pancreatic ductal dilatation or surrounding inflammatory changes. Spleen: Normal in size without focal abnormality. Adrenals/Urinary Tract: The adrenal glands are unremarkable. Moderate right renal parenchyma atrophy. Subcentimeter left renal hypodense lesions are too small to characterize. The visualized ureters and urinary bladder appear unremarkable. Stomach/Bowel: There is a 6 cm duodenal diverticulum. There is sigmoid diverticulosis. There is moderate stool throughout the colon. There is no bowel obstruction or active inflammation. Appendectomy. Vascular/Lymphatic: Advanced aortoiliac atherosclerotic disease. The IVC is unremarkable. No portal venous gas. There is no adenopathy. Reproductive: The uterus is grossly unremarkable. No suspicious adnexal masses Other: Surgical scar in the anterior abdominal wall.  Musculoskeletal: Osteopenia with degenerative changes. No acute osseous pathology. IMPRESSION: 1. Mildly displaced acute fractures of the lateral left fifth and sixth ribs. No pneumothorax. 2. No acute intra-abdominal or pelvic pathology. 3. Sigmoid diverticulosis. No bowel obstruction. 4.  Aortic Atherosclerosis (ICD10-I70.0). Electronically Signed   By: Vanetta Chou M.D.   On: 01/23/2024 21:54   DG Ribs Unilateral W/Chest Left Result Date: 01/23/2024 CLINICAL DATA:  Left chest wall pain.  Concern for rib fracture. EXAM: LEFT RIBS AND CHEST - 3+ VIEW COMPARISON:  Chest radiograph dated 12/21/2023. FINDINGS: Left lung base atelectasis/scarring. Pneumonia is not  excluded no pleural effusion pneumothorax. Stable cardiac silhouette. Atherosclerotic calcification of the spine. Osteopenia with degenerative changes of the spine and shoulders. Subacute right clavicular fracture with nonunion. No acute rib fractures. IMPRESSION: No acute rib fractures or pneumothorax. Electronically Signed   By: Vanetta Chou M.D.   On: 01/23/2024 20:22     Procedures   Medications Ordered in the ED  lidocaine  (LIDODERM ) 5 % 1 patch (1 patch Transdermal Patch Applied 01/23/24 1923)  iohexol  (OMNIPAQUE ) 300 MG/ML solution 100 mL (100 mLs Intravenous Contrast Given 01/23/24 2101)  oxyCODONE  (Oxy IR/ROXICODONE ) immediate release tablet 2.5 mg (2.5 mg Oral Given 01/23/24 2230)  sertraline  (ZOLOFT ) tablet 50 mg (50 mg Oral Given 01/23/24 2322)    Clinical Course as of 01/24/24 0000  Tue Jan 23, 2024  2217 Was taking tylenol  at her facility. 8 hour TID.  [TL]    Clinical Course User Index [TL] Simon Lavonia SAILOR, MD                                 Medical Decision Making Amount and/or Complexity of Data Reviewed Labs: ordered. Radiology: ordered.  Risk Prescription drug management.    Presents because of left rib pain.  Patient states it started about 1 to 2 days ago.  Happened whenever she was trying to the door  closed.  Since then, has been having worsening pain on this left rib side.  No falls that she is aware of.  No chest pain.  No nausea vomit diarrhea.  No abdominal pain.  Otherwise, patient stating that she is in her normal health.   No reported falls.  Signs of trauma to the patient's head.  No pain to palpation of cervical thoracic or lumbar vertebrae.  Patient seems to be at neurobaseline according to family.   Initially concern for possible rib fracture in the left lower side.  Did obtain x-ray which did not show an Kuneff acute pathology.  Given patient's complex history recently with colitis with abscess, did obtain CT scan of patient's chest abdomen as well.  CT chest does show concerns for 2 rib fractures on the left side.  Explains patient's pain.  Reproducible nature over this area.  Patient was given small dose of oxycodone .  Spoke to family about symptoms parameters as well as pain control while at facility.  No indication to keep the patient here at this point in time.   No clear infectious etiology.  Urine clean.  No large electrolyte derangements.  Labs look around baseline.  Globin is maybe slightly lower than baseline but otherwise no concerns for any kind of GI bleed at this point time.  Just needs to be monitored.  Creatinine 1.1 which is actually improved.   Patient will be discharge in stable condition.     Final diagnoses:  Closed fracture of multiple ribs of left side, initial encounter    ED Discharge Orders          Ordered    lidocaine  (LIDODERM ) 5 %  Every 24 hours        01/23/24 2218    oxyCODONE  (ROXICODONE ) 5 MG immediate release tablet  Every 4 hours PRN        01/23/24 2225               Simon Lavonia SAILOR, MD 01/24/24 0000

## 2024-01-23 NOTE — ED Notes (Signed)
 Patient transported to CT

## 2024-01-24 ENCOUNTER — Encounter: Payer: Self-pay | Admitting: Family Medicine

## 2024-01-25 ENCOUNTER — Telehealth: Payer: Self-pay | Admitting: Family Medicine

## 2024-01-25 ENCOUNTER — Other Ambulatory Visit: Payer: Self-pay | Admitting: Family Medicine

## 2024-01-25 NOTE — Telephone Encounter (Signed)
 Please put her on my schedule for 1120 On Monday as a facility visit at [facility] thanks-Dr. Glendia   Also please write out on a prescription pad for me to discontinue Prilosec with this patient then fax it to them and also notify [facility] that we are no longer utilizing this medicine  Also please find out from the clinical nurse over there-can they do a Hemoccult card to check for stool blood?  If not if we bring a IFBOT test will they be able to do this test if I show them how to do it then they can send the sample back to us  for testing  Thanks-Dr. Glendia

## 2024-01-29 ENCOUNTER — Ambulatory Visit: Admitting: Family Medicine

## 2024-01-29 NOTE — Telephone Encounter (Signed)
 Appt scheduled 01/30/24 per Dr Glendia

## 2024-01-30 ENCOUNTER — Telehealth: Payer: Self-pay | Admitting: Family Medicine

## 2024-01-30 ENCOUNTER — Ambulatory Visit (INDEPENDENT_AMBULATORY_CARE_PROVIDER_SITE_OTHER): Admitting: Family Medicine

## 2024-01-30 DIAGNOSIS — R443 Hallucinations, unspecified: Secondary | ICD-10-CM

## 2024-01-30 DIAGNOSIS — S42024D Nondisplaced fracture of shaft of right clavicle, subsequent encounter for fracture with routine healing: Secondary | ICD-10-CM | POA: Diagnosis not present

## 2024-01-30 DIAGNOSIS — F5101 Primary insomnia: Secondary | ICD-10-CM | POA: Diagnosis not present

## 2024-01-30 NOTE — Progress Notes (Unsigned)
   Subjective:    Patient ID: [name], female    DOB: [birth year], 89 y.o.   MRN: [id number]  HPI [facility] Patient has recent clavicle fracture as well as rib fractures she is homebound to her room sleeps in the bed sits in the chair Physical therapy working with her Her pain levels are controlled She denies being depressed today Her great granddaughter was with her Overall she seems to be in good spirits She does state she hurts with movement She is able to move her right arm much better because she is now out of the sling She is seeing orthopedist once and they will see her again in early October She denies any major setbacks She still has nighttime hallucinations states she sees people in her room at night and states others at the staff do not see it Tell her that seems very real She describes at times a lady with children in her room Other times is just a person laying on her bed    Review of Systems     Objective:   Physical Exam  General-in no acute distress Eyes-no discharge Lungs-respiratory rate normal, CTA CV-no murmurs,RRR Extremities skin warm dry no edema Neuro grossly normal Behavior normal, alert       Assessment & Plan:  Clavicle fracture-healing Rib fractures healing Uses oxycodone  that the ER prescribed sparingly Discontinue hydrocodone  Down the road if needing pain medication we can utilize low-dose hydrocodone  She does have anxiety related issues on medication Xanax  sparingly with panic attacks No Xanax  at bedtime Patient does have hallucinations we have been neurology referral Hallucinations could be related to her age Her granddaughter relates she is concerned that the vision deficits are causing the hallucinations I will plan to go back and see her in approximately 6 to 8 weeks sooner if any problems

## 2024-01-30 NOTE — Telephone Encounter (Signed)
 Nurses Please go ahead and put in a consultation for [facility] neurology Diagnosis hallucinations, cognitive dysfunction Patient's family prefers Dr. Charlie Rummer [facility]  We would like to specifically get an appointment with this doctor Please have referral team keep me updated thank you-Dr. Glendia

## 2024-01-31 ENCOUNTER — Other Ambulatory Visit: Payer: Self-pay

## 2024-01-31 DIAGNOSIS — N390 Urinary tract infection, site not specified: Secondary | ICD-10-CM | POA: Diagnosis not present

## 2024-01-31 DIAGNOSIS — R4189 Other symptoms and signs involving cognitive functions and awareness: Secondary | ICD-10-CM

## 2024-01-31 DIAGNOSIS — R443 Hallucinations, unspecified: Secondary | ICD-10-CM

## 2024-01-31 NOTE — Telephone Encounter (Signed)
 Orders placed for neurology referral as indicated in chart per provider

## 2024-02-02 ENCOUNTER — Telehealth: Payer: Self-pay | Admitting: *Deleted

## 2024-02-02 ENCOUNTER — Other Ambulatory Visit: Payer: Self-pay | Admitting: Family Medicine

## 2024-02-02 MED ORDER — ALPRAZOLAM 0.25 MG PO TABS: ORAL_TABLET | ORAL | 2 refills | Status: DC

## 2024-02-02 NOTE — Telephone Encounter (Signed)
 I resent with more specific directions  1 every day prn panic attack

## 2024-02-02 NOTE — Telephone Encounter (Signed)
 Copied from CRM [id number]. Topic: Clinical - Prescription Issue >> Feb 02, 2024 11:02 AM Treva T wrote: Reason for CRM: Received call from [facility] with [facility], calling on behalf of patient.  Per Erminio, prescription received for medication, ALPRAZolam  (XANAX ) 0.25 MG tablet, needs more specific directions on how to take medication. States as necessary cannot be used on prescription, needs to know when needed, example once a day, every 12 hours, etc.  New prescription will be need to be re-written.  Erminio can be reached back at [phone] for follow up at pharmacy.    [name] [place], [place] - [address] [place] [address] Phone: [phone] Fax: [phone]

## 2024-02-07 ENCOUNTER — Other Ambulatory Visit: Payer: Self-pay | Admitting: Family Medicine

## 2024-02-12 ENCOUNTER — Other Ambulatory Visit: Payer: Self-pay | Admitting: Family Medicine

## 2024-02-13 ENCOUNTER — Other Ambulatory Visit: Payer: Self-pay | Admitting: Family Medicine

## 2024-02-13 MED ORDER — HYDROCODONE-ACETAMINOPHEN 5-325 MG PO TABS: ORAL_TABLET | ORAL | 0 refills | Status: DC

## 2024-02-14 ENCOUNTER — Encounter: Payer: Self-pay | Admitting: Family Medicine

## 2024-02-14 ENCOUNTER — Ambulatory Visit: Admitting: Family Medicine

## 2024-02-14 DIAGNOSIS — G894 Chronic pain syndrome: Secondary | ICD-10-CM

## 2024-02-14 DIAGNOSIS — R443 Hallucinations, unspecified: Secondary | ICD-10-CM

## 2024-02-14 NOTE — Progress Notes (Signed)
   Subjective:    Patient ID: [name], female    DOB: [birth year], 89 y.o.   MRN: [id number]  HPI Patient continues to have hallucinations at night but they are not disturbing She relates that she has a lot of pain in her joints and ribs and back hurts throughout the day she states it affects her quality of life when she takes a low-dose hydrocodone  it helps Tylenol  does not help as much she has not had any falls recently Denies shortness of breath or chest pain  Review of Systems     Objective:   Physical Exam  General-in no acute distress Eyes-no discharge Lungs-respiratory rate normal, CTA CV-no murmurs,RRR Extremities skin warm dry no edema Neuro grossly normal Behavior normal, alert Patient is debilitated in a wheelchair  30 minutes spent with the patient discussing how she is doing plus also interacting with family via MyChart regarding her current situation plus also following up on her neurology referrals    Assessment & Plan:  Recent rib fractures healing Clavicle fracture healing Constant pain discomfort affecting her quality of life Tylenol  alone not helping enough I will communicate with her granddaughter regarding low-dose hydrocodone  to see how that would do for the patient  As for the hallucinations we are trying to set up an appointment with neurology please see previous notes for documentation

## 2024-02-15 ENCOUNTER — Other Ambulatory Visit: Payer: Self-pay | Admitting: Family Medicine

## 2024-02-15 ENCOUNTER — Other Ambulatory Visit: Payer: Self-pay

## 2024-02-15 DIAGNOSIS — G3184 Mild cognitive impairment, so stated: Secondary | ICD-10-CM

## 2024-02-15 DIAGNOSIS — R443 Hallucinations, unspecified: Secondary | ICD-10-CM

## 2024-02-15 MED ORDER — HYDROCODONE-ACETAMINOPHEN 5-325 MG PO TABS: ORAL_TABLET | ORAL | 0 refills | Status: DC

## 2024-02-15 NOTE — Telephone Encounter (Signed)
 Placed order per Dr Glendia. Autumn, can you please fax what is needed to [facility] since I am at [facility]. Thank you!

## 2024-02-15 NOTE — Telephone Encounter (Signed)
 Nurses I wrote out a order for her hydrocodone  I sent in the prescription to [facility] Please fax the order [facility]  Please also go ahead with a neurology consult with [facility] for hallucinations and mild cognitive decline  Please send MyChart message to her family-[name]  Thanks-Dr. Glendia  (It was noted that she was having hallucinations the other day when I saw her she related that these hallucinations occurred at nighttime and basically were nonthreatening but she was very convinced that these were real)

## 2024-02-15 NOTE — Telephone Encounter (Signed)
 Spoke with [facility] and they stated the script counts as the order to give medication

## 2024-02-16 ENCOUNTER — Ambulatory Visit

## 2024-02-19 ENCOUNTER — Emergency Department (HOSPITAL_COMMUNITY)

## 2024-02-19 ENCOUNTER — Emergency Department (HOSPITAL_COMMUNITY)
Admission: EM | Admit: 2024-02-19 | Discharge: 2024-02-19 | Disposition: A | Discharge status: Home / Self Care | Source: Skilled Nursing Facility | Attending: Emergency Medicine | Admitting: Emergency Medicine

## 2024-02-19 ENCOUNTER — Other Ambulatory Visit: Payer: Self-pay | Admitting: Family Medicine

## 2024-02-19 ENCOUNTER — Encounter (HOSPITAL_COMMUNITY): Payer: Self-pay | Admitting: Emergency Medicine

## 2024-02-19 ENCOUNTER — Other Ambulatory Visit: Payer: Self-pay

## 2024-02-19 DIAGNOSIS — M25552 Pain in left hip: Secondary | ICD-10-CM | POA: Diagnosis present

## 2024-02-19 DIAGNOSIS — R519 Headache, unspecified: Secondary | ICD-10-CM | POA: Diagnosis not present

## 2024-02-19 DIAGNOSIS — W19XXXA Unspecified fall, initial encounter: Secondary | ICD-10-CM | POA: Diagnosis not present

## 2024-02-19 DIAGNOSIS — W01198A Fall on same level from slipping, tripping and stumbling with subsequent striking against other object, initial encounter: Secondary | ICD-10-CM | POA: Insufficient documentation

## 2024-02-19 DIAGNOSIS — R6884 Jaw pain: Secondary | ICD-10-CM | POA: Insufficient documentation

## 2024-02-19 DIAGNOSIS — R42 Dizziness and giddiness: Secondary | ICD-10-CM | POA: Diagnosis not present

## 2024-02-19 DIAGNOSIS — S42021K Displaced fracture of shaft of right clavicle, subsequent encounter for fracture with nonunion: Secondary | ICD-10-CM | POA: Diagnosis not present

## 2024-02-19 DIAGNOSIS — S79912A Unspecified injury of left hip, initial encounter: Secondary | ICD-10-CM | POA: Diagnosis not present

## 2024-02-19 DIAGNOSIS — M542 Cervicalgia: Secondary | ICD-10-CM | POA: Insufficient documentation

## 2024-02-19 DIAGNOSIS — M25559 Pain in unspecified hip: Secondary | ICD-10-CM | POA: Diagnosis not present

## 2024-02-19 DIAGNOSIS — S2243XK Multiple fractures of ribs, bilateral, subsequent encounter for fracture with nonunion: Secondary | ICD-10-CM | POA: Diagnosis not present

## 2024-02-19 DIAGNOSIS — M1612 Unilateral primary osteoarthritis, left hip: Secondary | ICD-10-CM | POA: Diagnosis not present

## 2024-02-19 DIAGNOSIS — R079 Chest pain, unspecified: Secondary | ICD-10-CM | POA: Diagnosis not present

## 2024-02-19 DIAGNOSIS — I10 Essential (primary) hypertension: Secondary | ICD-10-CM | POA: Diagnosis not present

## 2024-02-19 LAB — COMPREHENSIVE METABOLIC PANEL WITH GFR
ALT: 13 U/L (ref 0–44)
AST: 19 U/L (ref 15–41)
Albumin: 4 g/dL (ref 3.5–5.0)
Alkaline Phosphatase: 95 U/L (ref 38–126)
Anion gap: 14 (ref 5–15)
BUN: 34 mg/dL — ABNORMAL HIGH (ref 8–23)
CO2: 18 mmol/L — ABNORMAL LOW (ref 22–32)
Calcium: 9.1 mg/dL (ref 8.9–10.3)
Chloride: 106 mmol/L (ref 98–111)
Creatinine, Ser: 1.32 mg/dL — ABNORMAL HIGH (ref 0.44–1.00)
GFR, Estimated: 36 mL/min — ABNORMAL LOW (ref >60)
Glucose, Bld: 108 mg/dL — ABNORMAL HIGH (ref 70–99)
Potassium: 4.4 mmol/L (ref 3.5–5.1)
Sodium: 138 mmol/L (ref 135–145)
Total Bilirubin: 0.7 mg/dL (ref 0.0–1.2)
Total Protein: 6.9 g/dL (ref 6.5–8.1)

## 2024-02-19 LAB — CBC WITH DIFFERENTIAL/PLATELET
Abs Immature Granulocytes: 0.04 K/uL (ref 0.00–0.07)
Basophils Absolute: 0 K/uL (ref 0.0–0.1)
Basophils Relative: 0 %
Eosinophils Absolute: 0.2 K/uL (ref 0.0–0.5)
Eosinophils Relative: 2 %
HCT: 38.6 % (ref 36.0–46.0)
Hemoglobin: 12.4 g/dL (ref 12.0–15.0)
Immature Granulocytes: 0 %
Lymphocytes Relative: 20 %
Lymphs Abs: 2.1 K/uL (ref 0.7–4.0)
MCH: 29.5 pg (ref 26.0–34.0)
MCHC: 32.1 g/dL (ref 30.0–36.0)
MCV: 91.7 fL (ref 80.0–100.0)
Monocytes Absolute: 0.8 K/uL (ref 0.1–1.0)
Monocytes Relative: 7 %
Neutro Abs: 7.3 K/uL (ref 1.7–7.7)
Neutrophils Relative %: 71 %
Platelets: 246 K/uL (ref 150–400)
RBC: 4.21 MIL/uL (ref 3.87–5.11)
RDW: 12.4 % (ref 11.5–15.5)
WBC: 10.4 K/uL (ref 4.0–10.5)
nRBC: 0 % (ref 0.0–0.2)

## 2024-02-19 LAB — URINALYSIS, ROUTINE W REFLEX MICROSCOPIC
Bilirubin Urine: NEGATIVE
Glucose, UA: NEGATIVE mg/dL
Hgb urine dipstick: NEGATIVE
Ketones, ur: NEGATIVE mg/dL
Leukocytes,Ua: NEGATIVE
Nitrite: NEGATIVE
Protein, ur: NEGATIVE mg/dL
Specific Gravity, Urine: 1.013 (ref 1.005–1.030)
pH: 6 (ref 5.0–8.0)

## 2024-02-19 LAB — TROPONIN I (HIGH SENSITIVITY)
Troponin I (High Sensitivity): 3 ng/L (ref <18)
Troponin I (High Sensitivity): 3 ng/L (ref <18)

## 2024-02-19 MED ORDER — LACTATED RINGERS IV BOLUS
500.0000 mL | Freq: Once | INTRAVENOUS | Status: AC
  Administered 2024-02-19: 500 mL via INTRAVENOUS

## 2024-02-19 MED ORDER — HYDROCODONE-ACETAMINOPHEN 5-325 MG PO TABS
0.5000 | ORAL_TABLET | Freq: Once | ORAL | Status: AC
  Administered 2024-02-19: 0.5 via ORAL
  Filled 2024-02-19: qty 1

## 2024-02-19 MED ORDER — FENTANYL CITRATE (PF) 100 MCG/2ML IJ SOLN: 25.0000 ug | Freq: Once | INTRAMUSCULAR | Status: DC

## 2024-02-19 NOTE — ED Provider Notes (Signed)
  Physical Exam  BP (!) 151/77   Pulse 76   Temp 98.2 F (36.8 C) (Oral)   Resp 19   SpO2 97%   Physical Exam  Procedures  Procedures  ED Course / MDM   Clinical Course as of 02/19/24 1713  Mon Feb 19, 2024  1531 Assumed care from Dr Bonnell. 89 yo F hx pAF not on ac, ICH, and ambulatory dysfunction who had a syncopal event and fall. Did hit her head. Had Ct head, maxface, c-spine that were unremarkable.  X-ray show recent rib fractures.  Awaiting UA and CT of the pelvis. WC bound at baseline. May have had a syncopal event but does not want any sort of intervention so will not need to be admitted for that. Family would like for pt to be dc'd home if possible.  [RP]  1552 UA without evidence of UTI. [RP]  1608 Granddaughter concerned about the patient's ear as well. [RP]  1653 CT of the pelvis without fracture.  Does have some soft tissue swelling and bruising.  Patient reevaluated and is not in acute distress.  Patient complaining of some left ear pain.  I did perform otoscopic exam of the left ear and there is some wax but is not impacted.  TM is normal.  Does have some tenderness to palpation of the pinna but no swelling.  No signs of otitis externa.  I suspect her ear may be hurting from the fall [RP]    Clinical Course User Index [RP] Yolande Lamar BROCKS, MD   Medical Decision Making Amount and/or Complexity of Data Reviewed Labs: ordered. Radiology: ordered.  Risk Prescription drug management.       Yolande Lamar BROCKS, MD 02/19/24 [phone]

## 2024-02-19 NOTE — ED Triage Notes (Signed)
 Pt bib [facility] w/ c/o dizziness and a fall today. Pt reports she hit her jaw and is complaining of left hip pain and left jaw. Pt denies hitting head but is unsure if she lost consciousness.

## 2024-02-19 NOTE — Discharge Instructions (Signed)
 You were seen for your fall in the emergency department.   At home, please continue the pain medication as prescribed.  Please have your primary doctor reach out to your facility about reconciling your prescriptions of hydrocodone .    Check your MyChart online for the results of any tests that had not resulted by the time you left the emergency department.   Follow-up with your primary doctor in 2-3 days regarding your visit.    Return immediately to the emergency department if you experience any of the following: Worsening pain, or any other concerning symptoms.    Thank you for visiting our Emergency Department. It was a pleasure taking care of you today.

## 2024-02-19 NOTE — ED Provider Notes (Signed)
 [place] EMERGENCY DEPARTMENT AT [facility] Provider Note   CSN: [id number] Arrival date & time: 02/19/24  1038     Patient presents with: Dizziness and Fall   [name] is a 89 y.o. female.   HPI 89 year old female presents after a fall.  History is from EMS and patient.  The patient has been feeling dizzy on and off for a while but dizziness recurred yesterday.  Hard for her to describe exactly how it feels but she thinks she passed out when she bent over and hit the left side of her jaw.  She is having mostly jaw pain and a little bit of neck pain on the left side but also having a little bit of mild left hip pain.  She denies any chest pain, headache, shortness of breath, vomiting.  She thinks the Tylenol  that she gets at her assisted living facility is what has been causing her dizziness.  Prior to Admission medications   Medication Sig Start Date End Date Taking? Authorizing Provider  acetaminophen  (GOODSENSE PAIN RELIEF EXTRA ST) 500 MG tablet TAKE (1) TABLET BY MOUTH TWICE DAILY FOR BACK PAIN. 02/08/24  Yes Luking, Glendia LABOR, MD  allopurinol  (ZYLOPRIM ) 100 MG tablet TAKE 1/2 TABLET(50MG ) BY MOUTH ONCE DAILY. Patient taking differently: Take 50 mg by mouth daily. 09/12/23  Yes Alphonsa Glendia LABOR, MD  ALPRAZolam  (XANAX ) 0.25 MG tablet 1 tablet q day as necessary for panic attack 02/02/24  Yes Luking, Glendia LABOR, MD  diltiazem  (CARDIZEM  CD) 120 MG 24 hr capsule Take 120 mg by mouth daily.   Yes [provider]  estradiol  (ESTRACE ) 0.1 MG/GM vaginal cream Discard plastic applicator. LTC staff are to insert a blueberry size amount (approximately 1 gram) of cream on gloved fingertip 1 cm inside patient vagina Monday and Friday night at bedtime. For long term use. Patient taking differently: Place 1 g vaginally 2 (two) times a week. Discard plastic applicator. LTC staff are to insert a blueberry size amount (approximately 1 gram) of cream on gloved fingertip 1 cm inside  patient vagina Monday and Friday night at bedtime. For long term use. 07/10/23  Yes Luking, Glendia LABOR, MD  HYDROcodone -acetaminophen  (NORCO/VICODIN) 5-325 MG tablet 1/2 at 8 am and 1/2 at 5 pm cancel other hydrocodone  instructions Patient taking differently: every 4 (four) hours as needed for moderate pain (pain score 4-6). 1/2 tablet every 4 hours as needed. 02/15/24  Yes Luking, Glendia LABOR, MD  levothyroxine  (SYNTHROID ) 75 MCG tablet TAKE (1) TABLET BY MOUTH EVERY MORNING. **TAKE ON AN EMPTY STOMACH AT LEAST 30 MINUTES FROM FOOD AND OTHER MEDS** Patient taking differently: Take 75 mcg by mouth daily before breakfast. 06/27/23  Yes Luking, Bryceson Grape A, MD  lidocaine  (LIDODERM ) 5 % Place 1 patch onto the skin daily. Remove & Discard patch within 12 hours or as directed by MD 01/23/24  Yes Lemly, Tatum N, MD  Melatonin 3 MG SUBL TAKE (1) TABLET BY MOUTH AT BEDTIME. 02/08/24  Yes Luking, Glendia LABOR, MD  Multiple Vitamin (DAILY-VITE) TABS TAKE (1) TABLET BY MOUTH ONCE DAILY. 02/08/24  Yes Alphonsa Glendia LABOR, MD  Multiple Vitamins-Minerals (MULTIVITAMINS THER. W/MINERALS) TABS tablet Take 1 tablet by mouth daily. 11/13/23  Yes Alphonsa Glendia LABOR, MD  oxyCODONE  (OXY IR/ROXICODONE ) 5 MG immediate release tablet Take 5 mg by mouth every 4 (four) hours as needed for severe pain (pain score 7-10).   Yes [provider]  potassium chloride  (KLOR-CON  M) 10 MEQ tablet 1 on  Monday morning and 1 on Friday morning Patient taking differently: Take 10 mEq by mouth 2 (two) times a week. 1 on Monday morning and 1 on Friday morning 07/17/23  Yes Luking, Glendia LABOR, MD  potassium chloride  (KLOR-CON ) 10 MEQ tablet TAKE 1 TABLET BY MOUTH EACH MORNING ON MONDAY AND FRIDAY. 12/07/23  Yes Luking, Glendia LABOR, MD  REFRESH 1.4-0.6 % SOLN INSTILL ONE DROP IN EACH EYE TWICE DAILY. Patient taking differently: Apply 1 drop to eye in the morning and at bedtime. 07/19/23  Yes Alphonsa Glendia LABOR, MD  sertraline  (ZOLOFT ) 50 MG tablet Take 1 tablet (50 mg total) by  mouth daily. 01/18/24  Yes Luking, Glendia LABOR, MD  torsemide  (DEMADEX ) 20 MG tablet TAKE (1) TABLET BY MOUTH EACH MORNING ON MONDAY AND FRIDAY. 12/07/23  Yes Luking, Glendia LABOR, MD  trimethoprim  (TRIMPEX ) 100 MG tablet TAKE (1) TABLET BY MOUTH ONCE DAILY. 12/07/23  Yes Luking, Glendia LABOR, MD  diltiazem  (CARDIZEM  CD) 180 MG 24 hr capsule TAKE (1) CAPSULE BY MOUTH ONCE DAILY. Patient not taking: Reported on 02/19/2024 12/07/23   Alphonsa Glendia LABOR, MD    Allergies: Amiodarone , Cephalosporins, Meloxicam , Robaxin  [methocarbamol ], Amoxicillin, Lovenox [enoxaparin sodium], Morphine, Morphine and codeine, Penicillins, Sulfa antibiotics, Lipitor [atorvastatin], Lorazepam , Macrobid  [nitrofurantoin ], and Metoprolol     Review of Systems  Musculoskeletal:  Positive for arthralgias and neck pain. Negative for back pain.  Neurological:  Positive for light-headedness. Negative for headaches.    Updated Vital Signs BP (!) 151/77   Pulse 76   Temp 98.2 F (36.8 C) (Oral)   Resp 19   SpO2 97%   Physical Exam Vitals and nursing note reviewed.  Constitutional:      General: She is not in acute distress.    Appearance: She is well-developed. She is not ill-appearing or diaphoretic.  HENT:     Head: Normocephalic and atraumatic.   Cardiovascular:     Rate and Rhythm: Normal rate and regular rhythm.     Pulses:          Dorsalis pedis pulses are 2+ on the left side.     Heart sounds: Normal heart sounds.  Pulmonary:     Effort: Pulmonary effort is normal.     Breath sounds: Normal breath sounds.  Abdominal:     Palpations: Abdomen is soft.     Tenderness: There is no abdominal tenderness.  Musculoskeletal:     Left hip: Tenderness present. Normal range of motion.  Skin:    General: Skin is warm and dry.  Neurological:     Mental Status: She is alert.     (all labs ordered are listed, but only abnormal results are displayed) Labs Reviewed  COMPREHENSIVE METABOLIC PANEL WITH GFR - Abnormal; Notable for the  following components:      Result Value   CO2 18 (*)    Glucose, Bld 108 (*)    BUN 34 (*)    Creatinine, Ser 1.32 (*)    GFR, Estimated 36 (*)    All other components within normal limits  CBC WITH DIFFERENTIAL/PLATELET  URINALYSIS, ROUTINE W REFLEX MICROSCOPIC  TROPONIN I (HIGH SENSITIVITY)  TROPONIN I (HIGH SENSITIVITY)    EKG: EKG Interpretation Date/Time:  Monday February 19 2024 11:40:58 EDT Ventricular Rate:  73 PR Interval:  225 QRS Duration:  95 QT Interval:  396 QTC Calculation: 437 R Axis:   -59  Text Interpretation: Sinus rhythm Ventricular premature complex Prolonged PR interval Left anterior fascicular block Low voltage, precordial leads  Confirmed by Freddi Hamilton [phone]) on 02/19/2024 11:55:02 AM  Radiology: CT Cervical Spine Wo Contrast Result Date: 02/19/2024 CLINICAL DATA:  89 year old female with dizziness and fall today. Pain. EXAM: CT CERVICAL SPINE WITHOUT CONTRAST TECHNIQUE: Multidetector CT imaging of the cervical spine was performed without intravenous contrast. Multiplanar CT image reconstructions were also generated. RADIATION DOSE REDUCTION: This exam was performed according to the departmental dose-optimization program which includes automated exposure control, adjustment of the mA and/or kV according to patient size and/or use of iterative reconstruction technique. COMPARISON:  Head and face CT today.  Cervical spine CT 08/24/2023. FINDINGS: Alignment: Chronic straightening of cervical lordosis, subtle degenerative anterolisthesis (C4 on C5) is stable. Stable cervicothoracic junction, posterior element alignment. Skull base and vertebrae: Stable bone mineralization. Visualized skull base is intact. No atlanto-occipital dissociation. C1 and C2 appear intact and aligned. No acute osseous abnormality identified. Soft tissues and spinal canal: No prevertebral fluid or swelling. No visible canal hematoma. Motion artifact at the larynx. Punctate left parotid  sialolithiasis. Otherwise negative visible noncontrast neck soft tissues. Disc levels: Chronic cervical spine degeneration including degenerative ligamentous hypertrophy about the odontoid. Stable CT appearance since April. Upper chest: Mild chronic T1 and T2 superior endplate compression is stable. Negative visible noncontrast thoracic inlet otherwise. IMPRESSION: 1. No acute traumatic injury identified in the cervical spine. 2. Chronic cervical spine degeneration stable by CT. 3. Mild chronic upper thoracic compression fracture is stable. Electronically Signed   By: VEAR Hurst M.D.   On: 02/19/2024 12:41   CT Maxillofacial Wo Contrast Result Date: 02/19/2024 CLINICAL DATA:  89 year old female with dizziness and fall today. Pain. EXAM: CT MAXILLOFACIAL WITHOUT CONTRAST TECHNIQUE: Multidetector CT imaging of the maxillofacial structures was performed. Multiplanar CT image reconstructions were also generated. RADIATION DOSE REDUCTION: This exam was performed according to the departmental dose-optimization program which includes automated exposure control, adjustment of the mA and/or kV according to patient size and/or use of iterative reconstruction technique. COMPARISON:  Head and cervical spine CT today. FINDINGS: Osseous: Mandible intact and normally located. No acute dental finding identified. Bilateral maxilla, zygoma, pterygoid, and nasal bones appear intact. Visible central skull base intact. Orbits: Intact orbital walls. Postoperative changes to both globes. Otherwise orbits soft tissues appear normal. Sinuses: Clear. Soft tissues: Negative visible noncontrast larynx, pharynx, parapharyngeal spaces, retropharyngeal space, sublingual space, submandibular spaces, masticator and parotid spaces. Limited intracranial: Stable to that reported separately. IMPRESSION: No acute traumatic injury identified in the Face. Electronically Signed   By: VEAR Hurst M.D.   On: 02/19/2024 12:38   DG Hip Unilat W or Wo Pelvis 2-3  Views Left Result Date: 02/19/2024 CLINICAL DATA:  89 year old female with dizziness and fall today. Pain. EXAM: DG HIP (WITH OR WITHOUT PELVIS) 2-3V LEFT COMPARISON:  Pelvis and hip series 06/01/2016. FINDINGS: Pelvis appears stable and intact. Bone mineralization is within normal limits for age. SI joints remain symmetric. Nonobstructed bowel-gas pattern. Femoral heads are normally located. Grossly intact proximal right femur. Proximal left femur appears stable and intact. IMPRESSION: No acute fracture or dislocation identified about the left hip or pelvis. Electronically Signed   By: VEAR Hurst M.D.   On: 02/19/2024 12:36   DG Chest 1 View Result Date: 02/19/2024 CLINICAL DATA:  89 year old female with dizziness and fall today. Pain. EXAM: CHEST  1 VIEW COMPARISON:  CT Chest, Abdomen, and Pelvis today are reported separately. 01/23/2024. FINDINGS: AP upright view 1202 hours. Stable lung volumes from the CT earlier this month. Calcified aortic atherosclerosis. Stable cardiac  size and mediastinal contours. Visualized tracheal air column is within normal limits. Displaced and incompletely healed left lateral rib fractures (left ribs 4 and 5 visible). More chronic appearing left lower rib fractures. Chronic appearing right lateral rib fractures. Midshaft right clavicle fracture with healing, also visible previously. No new osseous abnormality identified. Allowing for portable technique the lungs are clear. No pneumothorax or pleural effusion identified. Negative visible bowel gas. IMPRESSION: 1. No acute cardiopulmonary abnormality. 2. Recent displaced left lateral rib fractures. Healing right clavicle fracture. Chronic rib fractures. Electronically Signed   By: VEAR Hurst M.D.   On: 02/19/2024 12:33   CT Head Wo Contrast Result Date: 02/19/2024 CLINICAL DATA:  89 year old female with dizziness and fall today. Pain. EXAM: CT HEAD WITHOUT CONTRAST TECHNIQUE: Contiguous axial images were obtained from the base of the  skull through the vertex without intravenous contrast. RADIATION DOSE REDUCTION: This exam was performed according to the departmental dose-optimization program which includes automated exposure control, adjustment of the mA and/or kV according to patient size and/or use of iterative reconstruction technique. COMPARISON:  Brain MRI 07/17/2017. Face and cervical spine CT today reported separately. Head CT 12/22/2023. FINDINGS: Brain: Stable cerebral volume. Stable ventricle size and configuration. No midline shift, mass effect, or evidence of intracranial mass lesion. No acute intracranial hemorrhage identified. No cortically based acute infarct identified. Stable basal ganglia vascular calcifications. Gray-white differentiation is stable and within normal limits for age. Vascular: No suspicious intracranial vascular hyperdensity. Calcified atherosclerosis at the skull base. Skull: Appears stable and intact. Sinuses/Orbits: Visualized paranasal sinuses and mastoids are stable and well aerated. Other: No discrete orbit or scalp soft tissue injury identified. IMPRESSION: 1. No acute intracranial abnormality or acute traumatic injury identified. 2. Face and cervical spine CT today reported separately. Electronically Signed   By: VEAR Hurst M.D.   On: 02/19/2024 12:26     Procedures   Medications Ordered in the ED  lactated ringers  bolus 500 mL (0 mLs Intravenous Stopped 02/19/24 1515)  HYDROcodone -acetaminophen  (NORCO/VICODIN) 5-325 MG per tablet 0.5 tablet (0.5 tablets Oral Given 02/19/24 1322)                                   Medical Decision Making Amount and/or Complexity of Data Reviewed Labs: ordered.    Details: Normal WBC Radiology: ordered.    Details: No hip fracture ECG/medicine tests: ordered and independent interpretation performed.    Details: Sinus rhythm  Risk Prescription drug management.   Patient presents after a fall.  Possibly syncope.  Discussed with patient and  granddaughter, she would not want admission or pacemaker placement if necessary. Workup so far is unremarkable including negative labs.  Urine is currently pending.  CT will be ordered as she is still having some pain in her hip.  She rarely walks and normally only weightbears to switch from sitting/lying to wheelchair.  Care transferred to Dr. Yolande.     Final diagnoses:  None    ED Discharge Orders     None          Freddi Hamilton, MD 02/19/24 [phone]

## 2024-02-19 NOTE — ED Notes (Signed)
 Pt refused any sticks for blood or IV. Education was given. PT continued to refuse. Md notified

## 2024-02-20 ENCOUNTER — Other Ambulatory Visit: Payer: Self-pay | Admitting: Family Medicine

## 2024-02-20 DIAGNOSIS — S42024A Nondisplaced fracture of shaft of right clavicle, initial encounter for closed fracture: Secondary | ICD-10-CM | POA: Insufficient documentation

## 2024-02-20 NOTE — Telephone Encounter (Signed)
Spoke with [place]

## 2024-02-21 ENCOUNTER — Telehealth: Payer: Self-pay | Admitting: Orthopedic Surgery

## 2024-02-21 NOTE — Telephone Encounter (Signed)
 Spoke w/the pt's granddaughter, Cena [phone], she stated the pt fell on Monday and went to the ED.  Stated she is extremely sore and unable to come tomorrow.  She cx the appointment.  She wants to know if you could look at the ED notes/x-ray reports and advise if/when she needs to come back.

## 2024-02-22 ENCOUNTER — Encounter: Admitting: Orthopedic Surgery

## 2024-02-22 ENCOUNTER — Telehealth: Payer: Self-pay

## 2024-02-22 DIAGNOSIS — S42024D Nondisplaced fracture of shaft of right clavicle, subsequent encounter for fracture with routine healing: Secondary | ICD-10-CM

## 2024-02-22 NOTE — Telephone Encounter (Signed)
 Called [name] and lvm advising, asked for a cb to get her scheduled

## 2024-02-22 NOTE — Telephone Encounter (Signed)
--   it looks like another neurology referral has already been put in and there is a message from [facility] in the referral order-- please advise    Copied from CRM #[id number]. Topic: Referral - Question >> Feb 21, 2024  9:32 AM Travis F wrote: Reason for CRM: Patient's granddaughter is calling in because she is requesting a neurology referral for a provider in [facility] because [facility] can't see her until 07/2024 and that is not ideal.

## 2024-02-23 NOTE — Telephone Encounter (Signed)
 Nurses-the medical referral person with neurology office in [place] sent me a message I sent them a message back It is standard for them to review the chart before signing an appointment Please let family know that it is in the works If they do not hear anything back within the next 2 weeks to notify us  thank you

## 2024-03-01 MED FILL — Lactated Ringer's Solution: INTRAVENOUS | Qty: 500 | Status: AC

## 2024-03-06 ENCOUNTER — Other Ambulatory Visit: Payer: Self-pay | Admitting: Family Medicine

## 2024-03-14 ENCOUNTER — Other Ambulatory Visit: Payer: Self-pay | Admitting: Family Medicine

## 2024-03-14 MED ORDER — ALPRAZOLAM 0.25 MG PO TABS: ORAL_TABLET | ORAL | 2 refills | Status: DC

## 2024-03-14 MED ORDER — HYDROCODONE-ACETAMINOPHEN 5-325 MG PO TABS: ORAL_TABLET | ORAL | 0 refills | Status: DC

## 2024-03-14 NOTE — Progress Notes (Signed)
 Refills of alprazolam  and hydrocodone  were sent in as requested by her assisted living facility [facility] 

## 2024-03-19 ENCOUNTER — Ambulatory Visit: Admitting: Neurology

## 2024-03-25 DIAGNOSIS — I129 Hypertensive chronic kidney disease with stage 1 through stage 4 chronic kidney disease, or unspecified chronic kidney disease: Secondary | ICD-10-CM | POA: Diagnosis not present

## 2024-03-25 DIAGNOSIS — N390 Urinary tract infection, site not specified: Secondary | ICD-10-CM | POA: Diagnosis not present

## 2024-04-02 ENCOUNTER — Other Ambulatory Visit: Payer: Self-pay | Admitting: Family Medicine

## 2024-04-08 ENCOUNTER — Other Ambulatory Visit: Payer: Self-pay | Admitting: Family Medicine

## 2024-04-09 ENCOUNTER — Telehealth: Payer: Self-pay | Admitting: Family Medicine

## 2024-04-09 ENCOUNTER — Other Ambulatory Visit: Payer: Self-pay | Admitting: Family Medicine

## 2024-04-09 MED ORDER — ALPRAZOLAM 0.25 MG PO TABS: ORAL_TABLET | ORAL | 2 refills | Status: DC

## 2024-04-09 MED ORDER — HYDROCODONE-ACETAMINOPHEN 5-325 MG PO TABS: ORAL_TABLET | ORAL | 0 refills | Status: DC

## 2024-04-09 NOTE — Telephone Encounter (Signed)
 Front staff Please set up the patient for me to see her at her care facility [facility] You may use either a 1120 slot or 4:10 PM slot somewhere no later than December 14 thank you

## 2024-04-11 ENCOUNTER — Telehealth: Payer: Self-pay

## 2024-04-11 DIAGNOSIS — N390 Urinary tract infection, site not specified: Secondary | ICD-10-CM | POA: Diagnosis not present

## 2024-04-11 NOTE — Telephone Encounter (Signed)
 Copied from CRM [id number]. Topic: Clinical - Prescription Issue >> Apr 10, 2024 11:31 AM Winona SAUNDERS wrote:  Erminio calling from [facility] calling to get permission to fill the pt rx 1 day earlier as they are closed on Saturdays which is when the prescription is dated for.  HYDROcodone -acetaminophen  (NORCO/VICODIN) 5-325 MG tablet

## 2024-04-11 NOTE — Telephone Encounter (Signed)
 May have permission to fill 1 day early

## 2024-04-11 NOTE — Telephone Encounter (Signed)
 [name] informed at [facility].

## 2024-04-17 ENCOUNTER — Other Ambulatory Visit: Payer: Self-pay | Admitting: Family Medicine

## 2024-04-17 MED ORDER — ONDANSETRON HCL 4 MG PO TABS: 4.0000 mg | ORAL_TABLET | Freq: Three times a day (TID) | ORAL | 1 refills | Status: AC | PRN

## 2024-04-17 NOTE — Progress Notes (Signed)
 Pt with some nausea per staff at [facility]- they are requesting medication

## 2024-04-23 ENCOUNTER — Telehealth: Payer: Self-pay | Admitting: *Deleted

## 2024-04-23 NOTE — Telephone Encounter (Addendum)
 Spoke with Chasity at [facility] will Fax # [phone] to hold xanax  until appt with doctor lLuking on 05/03/24

## 2024-04-23 NOTE — Telephone Encounter (Signed)
 Copied from CRM #[id number]. Topic: General - Other >> Apr 23, 2024  9:45 AM Antony RAMAN wrote: Reason for CRM: chasity from [facility] is calling to tell dr alphonsa that the pt is having issues at night where she's barricading her door because she thinks someone is coming in getting in her bed. Chasity is suspecting xanex. Cb- [phone]

## 2024-04-23 NOTE — Telephone Encounter (Signed)
 Please call the medical attendant at  [facility] although Xanax  in some situations can contribute to this the most likely cause is her age and some level of sundowners I would recommend stopping Xanax  currently and I will see her on Friday I will come late morning to see her Please fax order over to them at [facility]

## 2024-04-30 ENCOUNTER — Telehealth: Payer: Self-pay | Admitting: Family Medicine

## 2024-04-30 NOTE — Telephone Encounter (Signed)
 Nurses-please talk with [name]-this is a standard follow-up visit.  I was planning to go see the patient at [facility] Depending on the schedule I will see her either at 11:40 AM or 4:15 PM-4:20 PM If granddaughter has a preference please let me know She does not need to bring her to the office Please make sure you talk with granddaughter if there is something different I need to know about please let me know about it Also if a visit to her facility works for the granddaughter then please also connect with [facility] to let them know they do not have to bring her here

## 2024-04-30 NOTE — Telephone Encounter (Unsigned)
 Copied from CRM #[id number]. Topic: Clinical - Medical Advice >> Apr 30, 2024 11:18 AM Tiffini S wrote: Reason for CRM:  Patient granddaughter [name] called to inquiry about the appointment that was scheduled on 05/03/24- asked for a call back to discuss the details of the appointment at [phone]

## 2024-05-01 ENCOUNTER — Encounter: Payer: Self-pay | Admitting: *Deleted

## 2024-05-01 NOTE — Telephone Encounter (Signed)
 Message sent in my chart with provider's recommendations.

## 2024-05-03 ENCOUNTER — Ambulatory Visit: Admitting: Family Medicine

## 2024-05-07 ENCOUNTER — Encounter: Payer: Self-pay | Admitting: Family Medicine

## 2024-05-07 ENCOUNTER — Ambulatory Visit: Admitting: Family Medicine

## 2024-05-07 DIAGNOSIS — G894 Chronic pain syndrome: Secondary | ICD-10-CM | POA: Diagnosis not present

## 2024-05-07 DIAGNOSIS — R443 Hallucinations, unspecified: Secondary | ICD-10-CM

## 2024-05-07 DIAGNOSIS — F5101 Primary insomnia: Secondary | ICD-10-CM | POA: Diagnosis not present

## 2024-05-07 DIAGNOSIS — G3184 Mild cognitive impairment, so stated: Secondary | ICD-10-CM | POA: Diagnosis not present

## 2024-05-07 DIAGNOSIS — N1832 Chronic kidney disease, stage 3b: Secondary | ICD-10-CM

## 2024-05-07 DIAGNOSIS — F419 Anxiety disorder, unspecified: Secondary | ICD-10-CM

## 2024-05-07 NOTE — Progress Notes (Addendum)
" ° °  Subjective:    Patient ID: [name], female    DOB: [birth year], 89 y.o.   MRN: [id number] Approximately 30 minutes was spent at [facility] talking with their med tech as well as talking with the patient examining the patient and discussing how she is doing in our treatment plan  Glendia Fielding reason family medicine HPI Facility visit She is a patient at [facility]  She has multiple issues 1.  Intermittent constipation issues where she has difficult time having a bowel movement sometimes occurs daily sometimes every few days she states she is taking some sort of stool softener and MiraLAX  but it does not always work well  2.  She relates she is having visual hallucinations that she feels is very real She states that this occurs more so in the evening time and that people come in and sit on her bed or sit in a chair next-door.  The staff states that a couple times she tried to barricade the door to leave people out  3.-Patient relates pain on the left side of her neck radiates up to the left ear but down the neck into the clavicle region.  Relates a lot of intermittent soreness she states her neck hurts with rotating left and right and she relates a lot of stiffness  4.  She relates bilateral knee pain along with some intermittent numbness or burning sensation that she thinks may be neuropathy  5.  She has mild insomnia but she accepts this as part of getting older  #6-she has chronic pain she uses hydrocodone  intermittently  #7-she denies any panic attacks recently Review of Systems     Objective:   Physical Exam   General-in no acute distress Eyes-no discharge Lungs-respiratory rate normal, CTA CV-no murmurs,RRR Extremities skin warm dry Neuro grossly normal Behavior normal, alert Subjective discomfort left side of her neck eardrum on the right side normal left side some wax buildup not severe eardrum looks normal extremities trace edema along with having bunions and  hammertoes     Assessment & Plan:   1. Hallucinations (Primary) She does use pain medicine intermittently but I do not feel that that is causing her hallucinations I do believe that she is dealing with some mild cognitive changes She will be seeing a neurologist in the springtime I do not feel her symptoms are bad enough to put her on a medicine Antipsychotics would increase her risk of stroke and early death For now reassurance Avoiding Xanax  at nighttime because I think that could play a role theoretically  2. Mild cognitive impairment Patient overall is a pretty sharp person for her age.  But I do not feel that she is capable of living on her own by herself  3. Chronic pain syndrome Hydrocodone  for intermittent use but not frequent use  4. Primary insomnia Reassurance given  5. Stage 3b chronic kidney disease (HCC) Check labs periodically healthy diet  6. Anxiety Reassurance given  Intermittent constipation MiraLAX  adjust as needed to keep under control "

## 2024-05-08 ENCOUNTER — Other Ambulatory Visit: Payer: Self-pay | Admitting: Family Medicine

## 2024-05-08 MED ORDER — PANTOPRAZOLE SODIUM 40 MG PO TBEC: 40.0000 mg | DELAYED_RELEASE_TABLET | Freq: Every day | ORAL | 1 refills | Status: AC

## 2024-05-08 MED ORDER — ALPRAZOLAM 0.25 MG PO TABS: ORAL_TABLET | ORAL | 2 refills | Status: AC

## 2024-05-13 ENCOUNTER — Other Ambulatory Visit: Payer: Self-pay | Admitting: Family Medicine

## 2024-05-13 ENCOUNTER — Telehealth: Payer: Self-pay | Admitting: Family Medicine

## 2024-05-13 MED ORDER — NITROFURANTOIN MONOHYD MACRO 100 MG PO CAPS: 100.0000 mg | ORAL_CAPSULE | Freq: Two times a day (BID) | ORAL | 0 refills | Status: AC

## 2024-05-13 NOTE — Telephone Encounter (Signed)
 Nurses Urine culture shows UTI Macrobid  was sent to the pharmacy [facility] Please fax the order to [facility] Thank you

## 2024-05-14 ENCOUNTER — Other Ambulatory Visit: Payer: Self-pay | Admitting: Family Medicine

## 2024-05-14 ENCOUNTER — Telehealth: Payer: Self-pay | Admitting: *Deleted

## 2024-05-14 NOTE — Telephone Encounter (Signed)
 Macrobid  is listed as a drug intolerance not as a allergy According to previous reports she relates constipation with the medicine If she is having any problems with constipation increasing MiraLAX  or fiber can certainly help I would not consider this an allergy Also should be noted she has other allergies to multiple other medicines and intolerances to other medicines as well so choices are limited

## 2024-05-14 NOTE — Telephone Encounter (Signed)
 Copied from CRM #[id number]. Topic: Clinical - Prescription Issue >> May 13, 2024  1:25 PM Charlet HERO wrote: Reason for CRM:  Erminio calling from pharm [phone] calling about script for macrobid  has on file that she is allergic to the med, wants to make sure that he does not have it in his file. Please call back to let her know asap >> May 13, 2024  3:35 PM Olam RAMAN wrote: brenda from pharmacy about a call she was waiting for. And wanted to know about zanax if pot is taking it or not

## 2024-05-15 ENCOUNTER — Telehealth: Payer: Self-pay | Admitting: *Deleted

## 2024-05-15 NOTE — Telephone Encounter (Signed)
 Copied from CRM [id number]. Topic: Clinical - Medication Question >> May 13, 2024  3:36 PM Olam RAMAN wrote: Reason for CRM: brenda from pharmacy about a call she was waiting for. And wanted to know about zanax if pt is taking it or not CB: [phone]

## 2024-05-15 NOTE — Telephone Encounter (Signed)
 I spoke with [facility] It would be fine for her to use the Xanax  for panic attack during the daytime only Do not take Xanax  at bedtime for sleep I will be doing documentation on December 26 and will forward that to [facility] as well as [facility]

## 2024-05-15 NOTE — Telephone Encounter (Addendum)
 Pharmacy notified of provider's recommendations on Macrobid     See second message please :>> May 13, 2024  3:35 PM Olam RAMAN wrote: brenda from pharmacy about a call she was waiting for. And wanted to know about zanax if pot is taking it or not

## 2024-05-17 ENCOUNTER — Encounter: Payer: Self-pay | Admitting: Family Medicine

## 2024-05-17 ENCOUNTER — Telehealth: Payer: Self-pay | Admitting: Family Medicine

## 2024-05-17 ENCOUNTER — Other Ambulatory Visit: Payer: Self-pay | Admitting: Family Medicine

## 2024-05-17 NOTE — Telephone Encounter (Signed)
 I had a good communication with the med tech at [facility]  We discussed Xanax -he can be used on a as needed basis for panic attacks but not to be used for general anxiety or for sleep  She does have left side neck pain discomfort-I talked with her about that when I saw her at the the facility a couple weeks ago.  It was felt this was more musculoskeletal related to the degenerative spine disease.  Her CAT scan in September showed this.  She does get a hydrocodone  in the morning but I recommend Tylenol  later in the day also recommend cool compresses or warm compresses as needed.  I am sympathetic to what is going on with her regarding pain but I am also balancing the risk-benefit ratio of pain medication especially with frequent dosing in the elderly individual. We are trying to minimize utilizing hydrocodone  frequently because it could increase risk of side effects as well as cause increased confusion or hallucinations  I also verified with the staff that Macrobid  is not on her allergy list she in fact is already getting the antibiotic 5-day course should be adequate to take care of the problem  In addition to this I spoke with [facility] and indicated that Macrobid  is not on her allergy list it is a side effect with constipation which would not bar the use of Macrobid  for UTIs  We will send electronic communication to the family to let them know what is going on-I told the med tech if she has any additional questions problems or concerns to let us  know.  We will try to do a facility visit on the average of every 4 to 8 weeks to help facilitate the patient's care while also lessening the burden of leaving the facility to come to the office-Dr. Glendia Fielding

## 2024-05-29 ENCOUNTER — Telehealth: Payer: Self-pay | Admitting: *Deleted

## 2024-05-29 NOTE — Telephone Encounter (Signed)
 Copied from CRM [id number]. Topic: Clinical - Medication Question >> May 28, 2024 10:39 AM Montie POUR wrote: Reason for CRM:  Erminio from [facility] would like a call back at [phone] to discuss if Dr. Alphonsa wants Ms. Pavao on pantoprazole  (PROTONIX ) 40 MG tablet and Omeprazole  40 MG at the same time (together) Please let her know if he is going to discharge one of them.

## 2024-05-30 NOTE — Telephone Encounter (Signed)
 Called pharmacy and told Dr. orders about medication

## 2024-05-30 NOTE — Telephone Encounter (Signed)
 I would recommend Protonix  only.

## 2024-06-03 ENCOUNTER — Other Ambulatory Visit: Payer: Self-pay | Admitting: Family Medicine

## 2024-06-11 ENCOUNTER — Ambulatory Visit: Admitting: Urology

## 2024-06-13 ENCOUNTER — Telehealth: Payer: Self-pay | Admitting: Family Medicine

## 2024-06-13 ENCOUNTER — Telehealth: Payer: Self-pay | Admitting: *Deleted

## 2024-06-13 ENCOUNTER — Other Ambulatory Visit: Payer: Self-pay | Admitting: Family Medicine

## 2024-06-13 NOTE — Telephone Encounter (Unsigned)
 Copied from CRM #[id number]. Topic: Clinical - Medication Refill >> Jun 13, 2024  9:11 AM Ivette P wrote: Medication: HYDROcodone -acetaminophen  (NORCO/VICODIN) 5-325 MG tablet  Has the patient contacted their pharmacy? Yes (Agent: If no, request that the patient contact the pharmacy for the refill. If patient does not wish to contact the pharmacy document the reason why and proceed with request.) (Agent: If yes, when and what did the pharmacy advise?)  This is the patient's preferred pharmacy:  [facility], [place] - [address] [place] [address] Phone: [phone] Fax: [phone]  Is this the correct pharmacy for this prescription? Yes If no, delete pharmacy and type the correct one.   Has the prescription been filled recently? No  Is the patient out of the medication? Yes  Has the patient been seen for an appointment in the last year OR does the patient have an upcoming appointment? Yes  Can we respond through MyChart? No  Agent: Please be advised that Rx refills may take up to 3 business days. We ask that you follow-up with your pharmacy.

## 2024-06-13 NOTE — Telephone Encounter (Signed)
 Copied from CRM #[id number]. Topic: Clinical - Medication Refill >> Jun 13, 2024  9:11 AM Ivette P wrote: Medication: HYDROcodone -acetaminophen  (NORCO/VICODIN) 5-325 MG tablet  Has the patient contacted their pharmacy? Yes (Agent: If no, request that the patient contact the pharmacy for the refill. If patient does not wish to contact the pharmacy document the reason why and proceed with request.) (Agent: If yes, when and what did the pharmacy advise?)  This is the patient's preferred pharmacy:  [facility], [place] - [address] [place] [address] Phone: [phone] Fax: [phone]  Is this the correct pharmacy for this prescription? Yes If no, delete pharmacy and type the correct one.   Has the prescription been filled recently? No  Is the patient out of the medication? Yes  Has the patient been seen for an appointment in the last year OR does the patient have an upcoming appointment? Yes  Can we respond through MyChart? No  Agent: Please be advised that Rx refills may take up to 3 business days. We ask that you follow-up with your pharmacy.

## 2024-06-14 ENCOUNTER — Other Ambulatory Visit: Payer: Self-pay | Admitting: Family Medicine

## 2024-06-14 MED ORDER — HYDROCODONE-ACETAMINOPHEN 5-325 MG PO TABS: ORAL_TABLET | ORAL | 0 refills | Status: AC

## 2024-06-14 NOTE — Telephone Encounter (Signed)
 Prescription was sent to [facility]

## 2024-06-24 ENCOUNTER — Ambulatory Visit: Admitting: Neurology

## 2024-07-22 ENCOUNTER — Ambulatory Visit: Admitting: Neurology

## 2024-12-09 ENCOUNTER — Ambulatory Visit: Admitting: Urology
